# Patient Record
Sex: Female | Born: 1935 | Race: White | Hispanic: No | Smoking: Former smoker
Health system: Southern US, Community
[De-identification: ages and names within clinical notes are randomized; demographics above are authoritative.]

## PROBLEM LIST (undated history)

## (undated) DIAGNOSIS — R600 Localized edema: Secondary | ICD-10-CM

## (undated) DIAGNOSIS — E785 Hyperlipidemia, unspecified: Secondary | ICD-10-CM

## (undated) DIAGNOSIS — K219 Gastro-esophageal reflux disease without esophagitis: Secondary | ICD-10-CM

## (undated) DIAGNOSIS — G4730 Sleep apnea, unspecified: Secondary | ICD-10-CM

## (undated) DIAGNOSIS — J9611 Chronic respiratory failure with hypoxia: Secondary | ICD-10-CM

## (undated) DIAGNOSIS — IMO0002 Reserved for concepts with insufficient information to code with codable children: Secondary | ICD-10-CM

## (undated) DIAGNOSIS — I10 Essential (primary) hypertension: Secondary | ICD-10-CM

## (undated) DIAGNOSIS — I480 Paroxysmal atrial fibrillation: Secondary | ICD-10-CM

## (undated) DIAGNOSIS — F419 Anxiety disorder, unspecified: Secondary | ICD-10-CM

## (undated) DIAGNOSIS — J449 Chronic obstructive pulmonary disease, unspecified: Secondary | ICD-10-CM

## (undated) DIAGNOSIS — I639 Cerebral infarction, unspecified: Secondary | ICD-10-CM

## (undated) DIAGNOSIS — I2510 Atherosclerotic heart disease of native coronary artery without angina pectoris: Secondary | ICD-10-CM

## (undated) DIAGNOSIS — R001 Bradycardia, unspecified: Secondary | ICD-10-CM

## (undated) DIAGNOSIS — I4510 Unspecified right bundle-branch block: Secondary | ICD-10-CM

## (undated) DIAGNOSIS — I779 Disorder of arteries and arterioles, unspecified: Secondary | ICD-10-CM

## (undated) DIAGNOSIS — E119 Type 2 diabetes mellitus without complications: Secondary | ICD-10-CM

## (undated) DIAGNOSIS — M545 Low back pain: Secondary | ICD-10-CM

## (undated) DIAGNOSIS — Z9981 Dependence on supplemental oxygen: Secondary | ICD-10-CM

## (undated) DIAGNOSIS — I739 Peripheral vascular disease, unspecified: Secondary | ICD-10-CM

## (undated) DIAGNOSIS — I5189 Other ill-defined heart diseases: Secondary | ICD-10-CM

## (undated) DIAGNOSIS — I219 Acute myocardial infarction, unspecified: Secondary | ICD-10-CM

## (undated) DIAGNOSIS — G8929 Other chronic pain: Secondary | ICD-10-CM

## (undated) HISTORY — DX: Paroxysmal atrial fibrillation: I48.0

## (undated) HISTORY — DX: Anxiety disorder, unspecified: F41.9

## (undated) HISTORY — PX: CORONARY ANGIOPLASTY: SHX604

## (undated) HISTORY — DX: Unspecified right bundle-branch block: I45.10

## (undated) HISTORY — DX: Other ill-defined heart diseases: I51.89

## (undated) HISTORY — DX: Chronic respiratory failure with hypoxia: J96.11

## (undated) HISTORY — PX: CORONARY ANGIOPLASTY WITH STENT PLACEMENT: SHX49

## (undated) HISTORY — DX: Essential (primary) hypertension: I10

## (undated) HISTORY — DX: Peripheral vascular disease, unspecified: I73.9

## (undated) HISTORY — PX: TONSILLECTOMY: SUR1361

## (undated) HISTORY — DX: Chronic obstructive pulmonary disease, unspecified: J44.9

## (undated) HISTORY — PX: TRANSVAGINAL TAPE (TVT) REMOVAL: SHX6154

## (undated) HISTORY — DX: Reserved for concepts with insufficient information to code with codable children: IMO0002

## (undated) HISTORY — PX: CATARACT EXTRACTION W/ INTRAOCULAR LENS  IMPLANT, BILATERAL: SHX1307

## (undated) HISTORY — DX: Gastro-esophageal reflux disease without esophagitis: K21.9

## (undated) HISTORY — DX: Localized edema: R60.0

## (undated) HISTORY — DX: Hyperlipidemia, unspecified: E78.5

## (undated) HISTORY — PX: TOTAL ABDOMINAL HYSTERECTOMY: SHX209

## (undated) HISTORY — DX: Disorder of arteries and arterioles, unspecified: I77.9

## (undated) HISTORY — DX: Atherosclerotic heart disease of native coronary artery without angina pectoris: I25.10

## (undated) HISTORY — PX: CARDIAC CATHETERIZATION: SHX172

---

## 2000-03-15 ENCOUNTER — Other Ambulatory Visit: Admission: RE | Admit: 2000-03-15 | Discharge: 2000-03-15 | Payer: Self-pay | Admitting: Family Medicine

## 2000-06-30 ENCOUNTER — Inpatient Hospital Stay (HOSPITAL_COMMUNITY): Admission: RE | Admit: 2000-06-30 | Discharge: 2000-07-03 | Payer: Self-pay | Admitting: Cardiovascular Disease

## 2000-06-30 ENCOUNTER — Encounter: Payer: Self-pay | Admitting: Cardiovascular Disease

## 2000-06-30 ENCOUNTER — Encounter: Payer: Self-pay | Admitting: Cardiology

## 2000-07-20 ENCOUNTER — Encounter: Payer: Self-pay | Admitting: Internal Medicine

## 2000-07-20 ENCOUNTER — Encounter: Admission: RE | Admit: 2000-07-20 | Discharge: 2000-07-20 | Payer: Self-pay | Admitting: Internal Medicine

## 2000-07-25 ENCOUNTER — Encounter: Admission: RE | Admit: 2000-07-25 | Discharge: 2000-07-25 | Payer: Self-pay | Admitting: Internal Medicine

## 2000-07-25 ENCOUNTER — Encounter: Payer: Self-pay | Admitting: Internal Medicine

## 2001-07-17 ENCOUNTER — Ambulatory Visit (HOSPITAL_COMMUNITY): Admission: RE | Admit: 2001-07-17 | Discharge: 2001-07-17 | Payer: Self-pay | Admitting: Cardiovascular Disease

## 2001-07-17 ENCOUNTER — Encounter: Payer: Self-pay | Admitting: Cardiovascular Disease

## 2002-07-03 ENCOUNTER — Encounter: Admission: RE | Admit: 2002-07-03 | Discharge: 2002-07-03 | Payer: Self-pay | Admitting: Dermatology

## 2002-07-04 ENCOUNTER — Emergency Department (HOSPITAL_COMMUNITY): Admission: EM | Admit: 2002-07-04 | Discharge: 2002-07-04 | Payer: Self-pay | Admitting: Emergency Medicine

## 2004-10-15 ENCOUNTER — Inpatient Hospital Stay (HOSPITAL_COMMUNITY): Admission: EM | Admit: 2004-10-15 | Discharge: 2004-10-20 | Payer: Self-pay | Admitting: Emergency Medicine

## 2004-10-18 ENCOUNTER — Encounter: Payer: Self-pay | Admitting: Cardiology

## 2004-10-19 ENCOUNTER — Encounter (INDEPENDENT_AMBULATORY_CARE_PROVIDER_SITE_OTHER): Payer: Self-pay | Admitting: Cardiology

## 2004-11-29 ENCOUNTER — Encounter: Admission: RE | Admit: 2004-11-29 | Discharge: 2004-11-29 | Payer: Self-pay | Admitting: Internal Medicine

## 2005-07-22 ENCOUNTER — Encounter: Admission: RE | Admit: 2005-07-22 | Discharge: 2005-07-22 | Payer: Self-pay | Admitting: Orthopedic Surgery

## 2005-07-28 HISTORY — PX: SHOULDER OPEN ROTATOR CUFF REPAIR: SHX2407

## 2005-08-05 ENCOUNTER — Inpatient Hospital Stay (HOSPITAL_COMMUNITY): Admission: RE | Admit: 2005-08-05 | Discharge: 2005-08-06 | Payer: Self-pay | Admitting: Orthopedic Surgery

## 2005-09-08 ENCOUNTER — Encounter: Admission: RE | Admit: 2005-09-08 | Discharge: 2005-12-07 | Payer: Self-pay | Admitting: Orthopedic Surgery

## 2006-08-02 ENCOUNTER — Emergency Department (HOSPITAL_COMMUNITY): Admission: EM | Admit: 2006-08-02 | Discharge: 2006-08-02 | Payer: Self-pay | Admitting: Family Medicine

## 2007-06-07 ENCOUNTER — Encounter: Admission: RE | Admit: 2007-06-07 | Discharge: 2007-06-07 | Payer: Self-pay | Admitting: Cardiology

## 2007-07-30 ENCOUNTER — Encounter: Admission: RE | Admit: 2007-07-30 | Discharge: 2007-07-30 | Payer: Self-pay | Admitting: Family Medicine

## 2007-08-13 ENCOUNTER — Encounter: Admission: RE | Admit: 2007-08-13 | Discharge: 2007-08-13 | Payer: Self-pay | Admitting: Orthopedic Surgery

## 2007-09-29 ENCOUNTER — Encounter: Admission: RE | Admit: 2007-09-29 | Discharge: 2007-09-29 | Payer: Self-pay | Admitting: Orthopedic Surgery

## 2008-07-11 ENCOUNTER — Observation Stay (HOSPITAL_COMMUNITY): Admission: EM | Admit: 2008-07-11 | Discharge: 2008-07-13 | Payer: Self-pay | Admitting: Emergency Medicine

## 2008-07-17 ENCOUNTER — Encounter: Admission: RE | Admit: 2008-07-17 | Discharge: 2008-10-07 | Payer: Self-pay | Admitting: Orthopedic Surgery

## 2008-08-18 ENCOUNTER — Ambulatory Visit: Payer: Self-pay | Admitting: Pulmonary Disease

## 2008-08-18 ENCOUNTER — Inpatient Hospital Stay (HOSPITAL_COMMUNITY): Admission: EM | Admit: 2008-08-18 | Discharge: 2008-08-25 | Payer: Self-pay | Admitting: Emergency Medicine

## 2008-08-18 ENCOUNTER — Encounter (INDEPENDENT_AMBULATORY_CARE_PROVIDER_SITE_OTHER): Payer: Self-pay | Admitting: Cardiology

## 2009-06-25 ENCOUNTER — Inpatient Hospital Stay (HOSPITAL_COMMUNITY): Admission: EM | Admit: 2009-06-25 | Discharge: 2009-06-29 | Payer: Self-pay | Admitting: Emergency Medicine

## 2009-06-26 ENCOUNTER — Encounter (INDEPENDENT_AMBULATORY_CARE_PROVIDER_SITE_OTHER): Payer: Self-pay | Admitting: Cardiovascular Disease

## 2009-07-02 ENCOUNTER — Encounter: Payer: Self-pay | Admitting: Cardiology

## 2010-01-11 ENCOUNTER — Telehealth: Payer: Self-pay | Admitting: Family Medicine

## 2010-01-11 ENCOUNTER — Ambulatory Visit: Payer: Self-pay | Admitting: Family Medicine

## 2010-01-11 DIAGNOSIS — IMO0001 Reserved for inherently not codable concepts without codable children: Secondary | ICD-10-CM

## 2010-01-11 DIAGNOSIS — E119 Type 2 diabetes mellitus without complications: Secondary | ICD-10-CM | POA: Insufficient documentation

## 2010-01-11 LAB — CONVERTED CEMR LAB
ALT: 18 units/L (ref 0–35)
BUN: 45 mg/dL — ABNORMAL HIGH (ref 6–23)
Bilirubin, Direct: 0.2 mg/dL (ref 0.0–0.3)
Chloride: 105 meq/L (ref 96–112)
GFR calc non Af Amer: 39.11 mL/min (ref >60)
Glucose, Bld: 194 mg/dL — ABNORMAL HIGH (ref 70–99)
Hgb A1c MFr Bld: 7.8 % — ABNORMAL HIGH (ref 4.6–6.5)
Potassium: 4 meq/L (ref 3.5–5.1)
Sodium: 142 meq/L (ref 135–145)
Total Bilirubin: 0.5 mg/dL (ref 0.3–1.2)
Total Protein: 7.1 g/dL (ref 6.0–8.3)

## 2010-01-12 LAB — CONVERTED CEMR LAB: Total CK: 57 units/L (ref 7–177)

## 2010-01-21 ENCOUNTER — Ambulatory Visit: Payer: Self-pay | Admitting: Diagnostic Radiology

## 2010-01-21 ENCOUNTER — Emergency Department (HOSPITAL_BASED_OUTPATIENT_CLINIC_OR_DEPARTMENT_OTHER): Admission: EM | Admit: 2010-01-21 | Discharge: 2010-01-21 | Payer: Self-pay | Admitting: Emergency Medicine

## 2010-01-23 ENCOUNTER — Encounter: Payer: Self-pay | Admitting: Emergency Medicine

## 2010-01-23 ENCOUNTER — Ambulatory Visit: Payer: Self-pay | Admitting: Diagnostic Radiology

## 2010-01-23 ENCOUNTER — Inpatient Hospital Stay (HOSPITAL_COMMUNITY): Admission: EM | Admit: 2010-01-23 | Discharge: 2010-01-26 | Payer: Self-pay | Admitting: Cardiovascular Disease

## 2010-01-24 ENCOUNTER — Encounter (INDEPENDENT_AMBULATORY_CARE_PROVIDER_SITE_OTHER): Payer: Self-pay | Admitting: Cardiovascular Disease

## 2010-02-11 ENCOUNTER — Ambulatory Visit: Payer: Self-pay | Admitting: Family Medicine

## 2010-02-11 DIAGNOSIS — E785 Hyperlipidemia, unspecified: Secondary | ICD-10-CM

## 2010-02-11 DIAGNOSIS — I10 Essential (primary) hypertension: Secondary | ICD-10-CM

## 2010-02-15 ENCOUNTER — Encounter (INDEPENDENT_AMBULATORY_CARE_PROVIDER_SITE_OTHER): Payer: Self-pay | Admitting: *Deleted

## 2010-02-15 ENCOUNTER — Encounter: Payer: Self-pay | Admitting: Family Medicine

## 2010-03-09 ENCOUNTER — Telehealth: Payer: Self-pay | Admitting: Family Medicine

## 2010-03-12 ENCOUNTER — Telehealth: Payer: Self-pay | Admitting: Family Medicine

## 2010-03-31 ENCOUNTER — Ambulatory Visit: Payer: Self-pay | Admitting: Family Medicine

## 2010-04-25 ENCOUNTER — Inpatient Hospital Stay (HOSPITAL_COMMUNITY): Admission: EM | Admit: 2010-04-25 | Discharge: 2010-05-04 | Payer: Self-pay | Admitting: Emergency Medicine

## 2010-04-26 ENCOUNTER — Encounter (INDEPENDENT_AMBULATORY_CARE_PROVIDER_SITE_OTHER): Payer: Self-pay | Admitting: Emergency Medicine

## 2010-04-29 ENCOUNTER — Encounter (INDEPENDENT_AMBULATORY_CARE_PROVIDER_SITE_OTHER): Payer: Self-pay | Admitting: Cardiology

## 2010-04-29 ENCOUNTER — Encounter: Payer: Self-pay | Admitting: Cardiology

## 2010-05-24 ENCOUNTER — Telehealth: Payer: Self-pay | Admitting: Family Medicine

## 2010-06-04 ENCOUNTER — Emergency Department (HOSPITAL_COMMUNITY)
Admission: EM | Admit: 2010-06-04 | Discharge: 2010-06-05 | Payer: Self-pay | Source: Home / Self Care | Admitting: Emergency Medicine

## 2010-06-05 ENCOUNTER — Encounter: Payer: Self-pay | Admitting: Family Medicine

## 2010-06-07 ENCOUNTER — Ambulatory Visit: Payer: Self-pay | Admitting: Pulmonary Disease

## 2010-06-07 ENCOUNTER — Telehealth: Payer: Self-pay | Admitting: Pulmonary Disease

## 2010-06-07 DIAGNOSIS — I519 Heart disease, unspecified: Secondary | ICD-10-CM

## 2010-06-07 DIAGNOSIS — J9611 Chronic respiratory failure with hypoxia: Secondary | ICD-10-CM | POA: Insufficient documentation

## 2010-06-07 DIAGNOSIS — I2789 Other specified pulmonary heart diseases: Secondary | ICD-10-CM

## 2010-06-07 DIAGNOSIS — G4710 Hypersomnia, unspecified: Secondary | ICD-10-CM

## 2010-06-08 ENCOUNTER — Encounter: Payer: Self-pay | Admitting: Pulmonary Disease

## 2010-06-08 ENCOUNTER — Ambulatory Visit: Payer: Self-pay | Admitting: Pulmonary Disease

## 2010-06-09 ENCOUNTER — Telehealth (INDEPENDENT_AMBULATORY_CARE_PROVIDER_SITE_OTHER): Payer: Self-pay | Admitting: *Deleted

## 2010-06-09 ENCOUNTER — Encounter: Payer: Self-pay | Admitting: Pulmonary Disease

## 2010-06-11 ENCOUNTER — Encounter: Payer: Self-pay | Admitting: Pulmonary Disease

## 2010-06-16 ENCOUNTER — Ambulatory Visit: Payer: Self-pay | Admitting: Pulmonary Disease

## 2010-06-16 DIAGNOSIS — J439 Emphysema, unspecified: Secondary | ICD-10-CM

## 2010-06-24 ENCOUNTER — Encounter: Payer: Self-pay | Admitting: Pulmonary Disease

## 2010-06-30 ENCOUNTER — Telehealth: Payer: Self-pay | Admitting: Family Medicine

## 2010-07-06 ENCOUNTER — Ambulatory Visit
Admission: RE | Admit: 2010-07-06 | Discharge: 2010-07-06 | Payer: Self-pay | Source: Home / Self Care | Attending: Family Medicine | Admitting: Family Medicine

## 2010-07-06 DIAGNOSIS — I480 Paroxysmal atrial fibrillation: Secondary | ICD-10-CM | POA: Insufficient documentation

## 2010-07-07 ENCOUNTER — Telehealth: Payer: Self-pay | Admitting: Family Medicine

## 2010-07-08 ENCOUNTER — Telehealth: Payer: Self-pay | Admitting: Family Medicine

## 2010-07-17 ENCOUNTER — Encounter: Payer: Self-pay | Admitting: Family Medicine

## 2010-07-18 ENCOUNTER — Encounter: Payer: Self-pay | Admitting: Family Medicine

## 2010-07-19 ENCOUNTER — Encounter (INDEPENDENT_AMBULATORY_CARE_PROVIDER_SITE_OTHER): Payer: Self-pay | Admitting: *Deleted

## 2010-07-19 LAB — CONVERTED CEMR LAB
INR: 1.33 (ref <1.50)
Prothrombin Time: 16.7 s — ABNORMAL HIGH (ref 11.6–15.2)

## 2010-07-21 ENCOUNTER — Encounter: Payer: Self-pay | Admitting: Family Medicine

## 2010-07-29 NOTE — Miscellaneous (Signed)
  Clinical Lists Changes  Medications: Changed medication from WARFARIN SODIUM 5 MG TABS (WARFARIN SODIUM) Take 1-1/2 tablets by mouth daily. to WARFARIN SODIUM 5 MG TABS (WARFARIN SODIUM) Take 1-1/2 tablets by mouth daily except 2 tablets by mouth on Wednesdays and Sundays.

## 2010-07-29 NOTE — Progress Notes (Signed)
Summary: pt requests samples  Phone Note Call from Patient   Caller: Patient Summary of Call: Pt request samples of insulin.  One box of levemir given- lot [id number], exp 07/2011. Initial call taken by: Lowella Petties CMA,  March 12, 2010 3:28 PM  Follow-up for Phone Call        thanks.   Follow-up by: Crawford Givens MD,  March 12, 2010 5:00 PM

## 2010-07-29 NOTE — Miscellaneous (Signed)
Summary: Orders Update pft charges  Clinical Lists Changes  Orders: Added new Service order of Carbon Monoxide diffusing w/capacity (94720) - Signed Added new Service order of Lung Volumes (94240) - Signed Added new Service order of Spirometry (Pre & Post) (94060) - Signed 

## 2010-07-29 NOTE — Progress Notes (Signed)
Summary: prior auth for humulin is on your desk  Phone Note From Pharmacy   Caller: [facility] [phone] Summary of Call: Prior [name] is needed on humulin R.  Form is on your desk. Initial call taken by: Lowella Petties CMA, AAMA,  June 30, 2010 4:02 PM  Follow-up for Phone Call        see hard copy.  thanks. Crawford Givens MD  June 30, 2010 10:27 PM   Dr. Para March asked that I contact the patient to find out how much Humulin she is usually taking (average, since it is a sliding scale) so that he can finish the form.  Left message on voicemail  to return call. [name] CMA Duncan Dull)  July 01, 2010 10:38 AM   Additional Follow-up for Phone Call Additional follow up Details #1::        Patient states that if her BS is 151, she takes 3 or 4 units and ups it according to her reading.  This is done 3 times a day, at breakfast, lunch and dinner.  She is coming in for OV on Tuesday and states she has been in the hospital twice in the last 60 days.  Dr. Allyson Sabal and Dr. Craige Cotta should have records to forward to you on her care in the hospital.  [name] CMA Duncan Dull)  July 01, 2010 2:49 PM     Additional Follow-up for Phone Call Additional follow up Details #2::    Please get the records and we'll talk about her sugar when she comes in.  please get me with her.  Have her keep a log of how many units she is taking per day between now and then.  Bring that and her sliding scale directions in with her.   please hold the PA and I'll do it after the OV.  thanks.  Crawford Givens MD  July 01, 2010 2:58 PM   Patient Advised.   Patient says she is almost completely out of her Humulin R (sliding scale) and will not be able to keep the log between now and Tuesday without a refill on her medication.  She is going to call her insurance company to see if they will approve 1 refill until the PA can be submitted.  [name] CMA Duncan Dull)  July 01, 2010 4:07 PM     Additional Follow-up for  Phone Call Additional follow up Details #3:: Details for Additional Follow-up Action Taken: Patient phoned her insurance company and they told her that it is just a formality because of the new year.  They told her that they have it taken care of and she can get her refill whenever she needs it.  I think all is well at least until she comes in on Tuesday.  [name] CMA (AAMA)  July 01, 2010 4:46 PM   Noted.  Crawford Givens MD  July 01, 2010 4:58 PM    Appended Document: prior auth for humulin is on your desk Insurance company called to let us know that prior auth for insulin was approved.  They tried to fax a letter but the fax machine has been down all day.  Appended Document: prior auth for humulin is on your desk noted.

## 2010-07-29 NOTE — Progress Notes (Signed)
Summary: appt, new consult  Phone Note Call from Patient Call back at [phone]   Caller: son in-law//pearson grane Call For: clance Summary of Call: States that pt was d/c from [facility] two weeks ago and needs to be seen as a new pt today, re:sob, pls advise. Initial call taken by: Darletta Moll,  June 07, 2010 8:43 AM  Follow-up for Phone Call        spoke with pt Son-in-law and he staets that Dr. Selena Batten told him it was imperative that the pt be seen today.  We also recieved an email from Dr. Darrick Penna stating: "Received call from Dr. Dan Humphreys at [facility] ED regarding patient of Dr. Thurnell Garbe DOB 12/16/1927, seen in ED 06-04-10 with SOB/weakness. Requesting follow-up in pulm clinic." Pt set to see VS today at 11:45. Carron Curie CMA  June 07, 2010 10:20 AM

## 2010-07-29 NOTE — Progress Notes (Signed)
Summary: Alprazolam  Phone Note Refill Request Message from:  Fax from Pharmacy on May 24, 2010 10:22 AM  Refills Requested: Medication #1:  XANAX 0.25 MG TABS Take 1/2 to 1 tablet by mouth two times a day  as needed for anxiety. [facility]  Phone:   [phone]   Method Requested: Telephone to Pharmacy Initial call taken by: [name] CMA  Dull),  May 24, 2010 10:22 AM

## 2010-07-29 NOTE — Miscellaneous (Signed)
Summary: Pulmonary function test   Pulmonary Function Test Date: 06/08/2010 Height (in.): 61 Gender: Female  Pre-Spirometry FVC    Value: 1.46 L/min   Pred: 2.24 L/min     % Pred: 65 % FEV1    Value: 0.79 L     Pred: 1.50 L     % Pred: 53 % FEV1/FVC  Value: 54 %     Pred: 70 %     % Pred: . % FEF 25-75  Value: 0.32 L/min   Pred: 1.81 L/min     % Pred: 18 %  Post-Spirometry FVC    Value: 1.56 L/min   Pred: 2.24 L/min     % Pred: 69 % FEV1    Value: 0.93 L     Pred: 1.50 L     % Pred: 62 % FEV1/FVC  Value: 60 %     Pred: 70 %     % Pred: . % FEF 25-75  Value: 0.42 L/min   Pred: 1.81 L/min     % Pred: 23 %  Lung Volumes TLC    Value: 2.84 L   % Pred: 69 % RV    Value: 1.38 L   % Pred: 78 % DLCO    Value: 9.9 %   % Pred: 54 % DLCO/VA  Value: 3.37 %   % Pred: 101 %  Comments: Severe obstruction.  Positive bronchodilator response.  Mild restriction.  Moderate diffusion defect. Clinical Lists Changes  Observations: Added new observation of PFT COMMENTS: Severe obstruction.  Positive bronchodilator response.  Mild restriction.  Moderate diffusion defect. (06/08/2010 8:57) Added new observation of DLCO/VA%EXP: 101 % (06/08/2010 8:57) Added new observation of DLCO/VA: 3.37 % (06/08/2010 8:57) Added new observation of DLCO % EXPEC: 54 % (06/08/2010 8:57) Added new observation of DLCO: 9.9 % (06/08/2010 8:57) Added new observation of RV % EXPECT: 78 % (06/08/2010 8:57) Added new observation of RV: 1.38 L (06/08/2010 8:57) Added new observation of TLC % EXPECT: 69 % (06/08/2010 8:57) Added new observation of TLC: 2.84 L (06/08/2010 8:57) Added new observation of FEF2575%EXPS: 23 % (06/08/2010 8:57) Added new observation of PSTFEF25/75P: 1.81  (06/08/2010 8:57) Added new observation of PSTFEF25/75%: 0.42 L/min (06/08/2010 8:57) Added new observation of PSTFEV1/FCV%: . % (06/08/2010 8:57) Added new observation of FEV1FVCPRDPS: 70 % (06/08/2010 8:57) Added new observation of PSTFEV1/FVC:  60 % (06/08/2010 8:57) Added new observation of POSTFEV1%PRD: 62 % (06/08/2010 8:57) Added new observation of FEV1PRDPST: 1.50 L (06/08/2010 8:57) Added new observation of POST FEV1: 0.93 L/min (06/08/2010 8:57) Added new observation of POST FVC%EXP: 69 % (06/08/2010 8:57) Added new observation of FVCPRDPST: 2.24 L/min (06/08/2010 8:57) Added new observation of POST FVC: 1.56 L (06/08/2010 8:57) Added new observation of FEF % EXPEC: 18 % (06/08/2010 8:57) Added new observation of FEF25-75%PRE: 1.81 L/min (06/08/2010 8:57) Added new observation of FEF 25-75%: 0.32 L/min (06/08/2010 8:57) Added new observation of FEV1/FVC%EXP: . % (06/08/2010 8:57) Added new observation of FEV1/FVC PRE: 70 % (06/08/2010 8:57) Added new observation of FEV1/FVC: 54 % (06/08/2010 8:57) Added new observation of FEV1 % EXP: 53 % (06/08/2010 8:57) Added new observation of FEV1 PREDICT: 1.50 L (06/08/2010 8:57) Added new observation of FEV1: 0.79 L (06/08/2010 8:57) Added new observation of FVC % EXPECT: 65 % (06/08/2010 8:57) Added new observation of FVC PREDICT: 2.24 L (06/08/2010 8:57) Added new observation of FVC: 1.46 L (06/08/2010 8:57) Added new observation of PFT HEIGHT: 61  (06/08/2010 8:57) Added new observation of PFT DATE:  06/08/2010  (06/08/2010 8:57) 

## 2010-07-29 NOTE — Miscellaneous (Signed)
Summary: Advanced Directive  Advanced Directive   Imported By: Maryln Gottron 02/19/2010 15:28:48  _____________________________________________________________________  External Attachment:    Type:   Image     Comment:   External Document

## 2010-07-29 NOTE — Miscellaneous (Signed)
Summary: Overnight oximetry with 2.5 liters oxygen  Clinical Lists Changes 7hrs 57 min test time.  Mean SpO2 97.5%, low 86%.  Spent 40 sec with SpO2 < 88%.  Continue with 2.5 liters oxygen.

## 2010-07-29 NOTE — Letter (Signed)
Summary: Consult/[facility]  Consult/[facility]   Imported By: Sherian Rein 06/08/2010 08:32:01  _____________________________________________________________________  External Attachment:    Type:   Image     Comment:   External Document

## 2010-07-29 NOTE — Progress Notes (Signed)
Summary: needs OV to discuss PFt results  ---- Converted from flag ---- ---- 06/09/2010 9:04 AM, [name] MD wrote: can you schedule ms [place] for rov to discuss pft results.  thanks ------------------------------  Phone Note Outgoing Call   Call placed by: Michel Bickers CMA,  June 09, 2010 1:59 PM Call placed to: Patient Summary of Call: [facility].  Pt needs OV w/ VS to discuss PFT's. Initial call taken by: Michel Bickers CMA,  June 09, 2010 1:59 PM  Follow-up for Phone Call        Pt is sch for f/u on 06/16/10 @ 3pm w/ VS to discuss PFT's. Follow-up by: Michel Bickers CMA,  June 11, 2010 2:14 PM

## 2010-07-29 NOTE — Procedures (Signed)
Summary: Oximetry/[facility]  Oximetry/[facility]   Imported By: Sherian Rein 07/01/2010 14:41:18  _____________________________________________________________________  External Attachment:    Type:   Image     Comment:   External Document
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
## 2010-07-29 NOTE — Assessment & Plan Note (Signed)
Summary: NEW PT-XFER FROM [facility]   Vital Signs:  Patient profile:   77 year old female Height:      60 inches Weight:      160.25 pounds BMI:     31.41 Temp:     97.7 degrees F oral Pulse rate:   80 / minute Pulse rhythm:   regular BP sitting:   122 / 80  (left arm) Cuff size:   regular  Vitals Entered By: [name] CMA Duncan Dull) (January 11, 2010 10:43 AM) CC: Transfer from [place].  Patient states she has signed for her records to be here months ago, both here and at the [place] office.  I have nothing and I can tell she is getting a bit agitated with me as I'm questioning her about it.  So I have no meds or allergies to list.   History of Present Illness: [facility].  [phone] [employer] patient.  Cards: Dr. Allyson Sabal- last visit was a few months ago.  "I was doing pretty good until the last few weeks."   Daughter recent hired on new job, and patient hopes that will be a good change for her.    Dyspnea.  Longstanding for patient.  Worse recently, coinciding with increase in humidity.  No CP.  Leg cramps noted by patient at night.  Prev was just in calf bilaterally, but now above the knee.  Not every night, but "when I have them, they are bad."  Off statin now.  Had been taken off lipitor and crestor prev due to cramps.    Occ feeling of "things sticking" while swallowing.  No vomiting.  Can happen with solids but has no h/o aspiration of liquids.  No acute change recently.   Diabetes: on levemir insulin with humilin R insulin Using medications without difficulties:yes Hypoglycemic episodes:occ, but improved from prev Hyperglycemic episodes:occ, but improved from prev Feet problems: no  Blood Sugars averaging: usually  ~120 in AM, "I usually get shaky if it is <100 in the morning." Eye exam within last year: not yet; I have d/w patient.     Allergies (verified): 1)  ! * Opiates 2)  ! Morphine 3)  ! Crestor 4)  ! Lipitor  Past History:  Past Medical  History: DM2 HTN HLD h/o edema  Social History: Reviewed history and no changes required. Widowed, lives alone.  Daughter is local.  No smoking.   Review of Systems       See HPI.  Otherwise noncontributory.    Physical Exam  General:  GEN: nad, alert and oriented x3, both short term and long term recall excellent, not on O2 HEENT: mucous membranes moist NECK: supple w/o LA, no mass appreciated. CV: rrr.  PULM: ctab, no inc wob, no decrease in BS at bases ABD: soft, +bs EXT: no edema SKIN: no acute rash  decreased sensation on L foot compared to R foot.   Diabetes Management Exam:    Foot Exam (with socks and/or shoes not present):       Sensory-Pinprick/Light touch:          Left medial foot (L-4): normal          Left dorsal foot (L-5): normal          Left lateral foot (S-1): normal          Right medial foot (L-4): normal          Right dorsal foot (L-5): normal  Right lateral foot (S-1): normal       Sensory-Monofilament:          Left foot: diminished          Right foot: normal       Inspection:          Left foot: normal          Right foot: normal       Nails:          Left foot: normal          Right foot: normal   Impression & Recommendations:  Problem # 1:  DM (ICD-250.00)  2 samples of levemir given.  I have d/w patient re:her labs and this is improved from previous.  Requesting records and no changes in meds in meantime.  I have encouraged eye MD f/u.   Orders: TLB-A1C / Hgb A1C (Glycohemoglobin) (83036-A1C)  Her updated medication list for this problem includes:    Losartan Potassium 100 Mg Tabs (Losartan potassium) .Marland Kitchen... Take 1 tablet by mouth every morning    Aspirin 81 Mg Tabs (Aspirin) .Marland Kitchen... Take 1 tablet by mouth every morning    Humulin R 100 Unit/ml Soln (Insulin regular human) .Marland Kitchen... Per sliding scale    Levemir Flexpen 100 Unit/ml Soln (Insulin detemir) .Marland Kitchen... Take as directed; using 25 units two times a day as of  12/2009  Problem # 2:  MYALGIA (ICD-729.1) This is not due to statin as she is off this.  CK is pending as of my conversation with her.  Will contact with this.  No change in meds in the meantime.  Orders: T-CK Total [id number]) Specimen Handling (14782) TLB-Hepatic/Liver Function Pnl (80076-HEPATIC) TLB-BMP (Basic Metabolic Panel-BMET) (80048-METABOL)  Her updated medication list for this problem includes:    Aspirin 81 Mg Tabs (Aspirin) .Marland Kitchen... Take 1 tablet by mouth every morning  Problem # 3:  DYSPNEA (ICD-786.05)  This may have been related to air quality and heat/humidity.  BNP not elevated and lungs clear.  No change in meds now.  Continue lasix .  Also, will observe pharyngeal symptoms.  Oral and neck exam w/o acute abnormality and she'll report if this continues.  Defer work up for now.  She agrees.  Orders: TLB-BNP (B-Natriuretic Peptide) (83880-BNPR)  Complete Medication List: 1)  Klor-con 20 Meq Pack (Potassium chloride) .... Take 1 tablet by mouth every morning 2)  Furosemide 40 Mg Tabs (Furosemide) .... Take 1 tablet by mouth every morning and 1 tablet at noon and evening as needed. 3)  Amlodipine Besylate 10 Mg Tabs (Amlodipine besylate) .... Take 1 tablet by mouth once a day 4)  Imdur 60 Mg Xr24h-tab (Isosorbide mononitrate) .... Take 1 tablet by mouth every morning 5)  Metoprolol Succinate 25 Mg Xr24h-tab (Metoprolol succinate) .... Take 1/2 tablet by mouth every morning 6)  Losartan Potassium 100 Mg Tabs (Losartan potassium) .... Take 1 tablet by mouth every morning 7)  Aspirin 81 Mg Tabs (Aspirin) .... Take 1 tablet by mouth every morning 8)  Humulin R 100 Unit/ml Soln (Insulin regular human) .... Per sliding scale 9)  Levemir Flexpen 100 Unit/ml Soln (Insulin detemir) .... Take as directed; using 25 units two times a day as of 12/2009  Patient Instructions: 1)  Please schedule a follow-up appointment in 3 months . We'll contact you with your lab report.   Prescriptions: LEVEMIR FLEXPEN 100 UNIT/ML SOLN (INSULIN DETEMIR) take as directed; using 25 units two times a day as of 12/2009  #  3 boxes x 3   Entered and Authorized by:   Crawford Givens MD   Signed by:   Crawford Givens MD on 01/11/2010   Method used:   Historical   RxID:   [id number]   Current Allergies (reviewed today): ! * OPIATES ! MORPHINE ! CRESTOR ! LIPITOR

## 2010-07-29 NOTE — Progress Notes (Signed)
Summary: Test strips  Phone Note From Pharmacy Call back at Home Phone [phone]   Caller:  [facility]  [phone] Call For: Dr. Para March  Summary of Call: Patient has contacted the pharmacy saying that she needs her test strips.  She has run out because she was told to test her blood sugar 8 times per day.  I only see that she was asked to report her blood sugars but nothing about testing 8 times per day. Initial call taken by: [name] CMA Duncan Dull),  March 09, 2010 3:27 PM  Follow-up for Phone Call        please send in new rx.  she shouldn't need to check that often.  I would expect 4x/day max.  Follow-up by: Crawford Givens MD,  March 09, 2010 3:31 PM  Additional Follow-up for Phone Call Additional follow up Details #1::        Done. Additional Follow-up by: [name] CMA Duncan Dull),  March 09, 2010 3:50 PM    Prescriptions: ONETOUCH ULTRA TEST  STRP (GLUCOSE BLOOD) check blood sugar twice a day  #100 x prn   Entered by:   [name] CMA (AAMA)   Authorized by:   Crawford Givens MD   Signed by:   [name] CMA (AAMA) on 03/09/2010   Method used:   Faxed to ...       Hospital doctor (retail)       [address]       [place], [place]  [address]       Ph: [phone] or [phone]       Fax: [phone]   RxID:   [id number] [place] TEST  STRP (GLUCOSE BLOOD) check blood sugar twice a day  #100 x prn   Entered and Authorized by:   Crawford Givens MD   Signed by:   Crawford Givens MD on 03/09/2010   Method used:   Telephoned to ...         RxID:   [id number]   Appended Document: Test strips Tried to phone patient to let her know to test 2 times per day, maximum 4 x/day.  No answer and no A/M.  Appended Document: Test strips No answer and no A/M.  Appended Document: Test strips Patient Advised.

## 2010-07-29 NOTE — Assessment & Plan Note (Signed)
Summary: 2:30 APPT NOT FEELING WEEK/RBH   Vital Signs:  Patient profile:   77 year old female Height:      60 inches Weight:      160.25 pounds BMI:     31.41 Temp:     97.7 degrees F oral Pulse rate:   80 / minute Pulse rhythm:   regular BP sitting:   132 / 70  (left arm) Cuff size:   regular  Vitals Entered By: Linde Gillis CMA Duncan Dull) (March 31, 2010 2:31 PM) CC: not feeling well   History of Present Illness: L ear with change noted- now with constant noise- not ringing but a "friction/static noise, like 2 wires rubbing together."  Doesn't like to wear hearing aid with it.  Going on for a few months.  Occ change in balance with position change, but no sig change from baseline.  No FCNAV.  No ear pain.  [facility] [place]    DM- Sugar has been variable.  Up to 300 occ.  Occ lows at night, woke up with symptoms.  Used a mint and took a snack with some relief for lows.  Using sliding scale.  Goal A1c is  ~8 to avoid sig/frequent hypoglycemia.    samples of crestor and zetia given to patient.   Allergies: 1)  ! * Opiates 2)  ! Morphine 3)  ! Crestor 4)  ! Lipitor  Past History:  Past Medical History: Last updated: 02/15/2010 DM2 HTN, pulmonary HTN HLD h/o edema Cath 8/11 at [facility] (Dr. Allyson Sabal), rec medical tx only w/o new stent ERF 50-55% as of 8/11 Full code, see note from 02/15/10 scanned  Social History: Reviewed history from 01/11/2010 and no changes required. Widowed, lives alone.  Daughter is local.  No smoking.   Review of Systems       See HPI.  Otherwise negative.    Physical Exam  General:  GEN: nad, alert and oriented HEENT: mucous membranes moist, TM w/o erythema bilaterally, bilateral SOM, more easily clears the R ET with valsalva than the L. nasal exam noted for clear rhinorrhea NECK: supple w/o LA CV: rrr. PULM: ctab, no inc wob ABD: soft, +bs Rinne localized to L, air>bone conduction bilaterally   Impression &  Recommendations:  Problem # 1:  DM (ICD-250.00) >25 min spent with patient, at least half of which was spent on counseling re:dm and ETD.  check A1c today and will not change meds unless sig change in A1c.  This was d/w patient and she agrees.  Her updated medication list for this problem includes:    Losartan Potassium 100 Mg Tabs (Losartan potassium) .Marland Kitchen... Take 1 tablet by mouth every morning    Aspirin 81 Mg Tabs (Aspirin) .Marland Kitchen... Take 1 tablet by mouth every morning    Humulin R 100 Unit/ml Soln (Insulin regular human) .Marland Kitchen... Per sliding scale: 150 or less, no r. 151 or higher, 3 units.  >200, 6 units    Levemir Flexpen 100 Unit/ml Soln (Insulin detemir) .Marland Kitchen... Take as directed; using 25 units two times a day as of 12/2009  Orders: TLB-A1C / Hgb A1C (Glycohemoglobin) (83036-A1C)  Problem # 2:  EUSTACHIAN TUBE DYSFUNCTION, LEFT (ICD-381.81) Will use otc claritin for rhinorrhea and see if this helps some.  I would like to avoid steroids of any variety in the patient if possible.  She agrees.  Refer back to hearing center if no relief.  She understood.   Complete Medication List:  1)  Klor-con 20 Meq Pack (Potassium chloride) .... Take 1 tablet by mouth every morning 2)  Furosemide 40 Mg Tabs (Furosemide) .... Take 1 tablet by mouth two times a day 3)  Imdur 60 Mg Xr24h-tab (Isosorbide mononitrate) .... Take 1 tablet by mouth every morning 4)  Losartan Potassium 100 Mg Tabs (Losartan potassium) .... Take 1 tablet by mouth every morning 5)  Aspirin 81 Mg Tabs (Aspirin) .... Take 1 tablet by mouth every morning 6)  Humulin R 100 Unit/ml Soln (Insulin regular human) .... Per sliding scale: 150 or less, no r. 151 or higher, 3 units.  >200, 6 units 7)  Levemir Flexpen 100 Unit/ml Soln (Insulin detemir) .... Take as directed; using 25 units two times a day as of 12/2009 8)  Lopressor 50 Mg Tabs (Metoprolol tartrate) .... 25 mg. two times a day 9)  Plavix 75 Mg Tabs (Clopidogrel bisulfate) .... Take 1  tablet by mouth once a day with a meal. 10)  Protonix 40 Mg Tbec (Pantoprazole sodium) .... Take 1 tablet by mouth every morning 11)  Nifedipine 30 Mg Xr24h-tab (Nifedipine) .... Take 1 tablet by mouth every morning 12)  Crestor 5 Mg Tabs (Rosuvastatin calcium) .Marland Kitchen.. 1 by mouth on mwf 13)  Onetouch Ultra Test Strp (Glucose blood) .... Check blood sugar twice a day 14)  Xanax 0.25 Mg Tabs (Alprazolam) .... Take 1/2 to 1 tablet by mouth two times a day  as needed for anxiety. 15)  Zetia 10 Mg Tabs (Ezetimibe) .Marland Kitchen.. 1 by mouth q day  Other Orders: Influenza Vaccine MCR (60454)  Patient Instructions: 1)  Take claritin 10mg  a day for your runny nose and see if it changes your symptoms in you left ear.  Let me know if you aren't doing better and we can set you up with the [facility] in [place].  I'll send word about your lab.  Take care.  Glad to see you.   Current Allergies (reviewed today): ! * OPIATES ! MORPHINE ! CRESTOR ! LIPITOR   Immunizations Administered:  Influenza Vaccine # 1:    Vaccine Type: Fluvax MCR    Site: left deltoid    Mfr: GlaxoSmithKline    Dose: 0.5 ml    Route: IM    Given by: Linde Gillis CMA (AAMA)    Exp. Date: 12/25/2010    Lot #: [id number]    VIS given: 01/19/10 version given March 31, 2010.  Flu Vaccine Consent Questions:    Do you have a history of severe allergic reactions to this vaccine? no    Any prior history of allergic reactions to egg and/or gelatin? no    Do you have a sensitivity to the preservative Thimersol? no    Do you have a past history of Guillan-Barre Syndrome? no    Do you currently have an acute febrile illness? no    Have you ever had a severe reaction to latex? no    Vaccine information given and explained to patient? yes    Are you currently pregnant? no

## 2010-07-29 NOTE — Progress Notes (Signed)
  Phone Note Outgoing Call   Summary of Call: call placed to patient. See OV note.

## 2010-07-29 NOTE — Progress Notes (Signed)
Summary: refill request for alprazolam  Phone Note Refill Request Message from:  Fax from Pharmacy  Refills Requested: Medication #1:  alprazolam .25   Last Refilled: 01/18/2010 Faxed request from [facility], phone [phone].   This is not on med list.  Initial call taken by: Lowella Petties CMA,  March 09, 2010 8:24 AM  Follow-up for Phone Call        please call this in.  xanax 0.25mg , 0.5 to 1 tab by mouth two times a day as needed anxiety  #30, 1rf.  thanks.  Follow-up by: Crawford Givens MD,  March 09, 2010 2:06 PM  Additional Follow-up for Phone Call Additional follow up Details #1::        Medication phoned to pharmacy.  Additional Follow-up by: [name] CMA (AAMA),  March 09, 2010 3:15 PM    New/Updated Medications: XANAX 0.25 MG TABS (ALPRAZOLAM) Take 1/2 to 1 tablet by mouth two times a day  as needed for anxiety. Prescriptions: XANAX 0.25 MG TABS (ALPRAZOLAM) Take 1/2 to 1 tablet by mouth two times a day  as needed for anxiety.  #30 x 1   Entered by:   [name] CMA (AAMA)   Authorized by:   Crawford Givens MD   Signed by:   [name] CMA (AAMA) on 03/09/2010   Method used:   Telephoned to ...         RxID:   [id number]

## 2010-07-29 NOTE — Progress Notes (Signed)
Summary: Rx Alprazolam  Phone Note Refill Request Call back at [phone] Message from:  [facility] on June 30, 2010 2:04 PM  Refills Requested: Medication #1:  XANAX 0.25 MG TABS Take 1/2 to 1 tablet by mouth two times a day  as needed for anxiety.   Last Refilled: 06/09/2010 Received faxed refill request please advise.   Method Requested: Telephone to Pharmacy Initial call taken by: Linde Gillis CMA Duncan Dull),  June 30, 2010 2:05 PM  Follow-up for Phone Call        please call in.  thanks.  Follow-up by: Crawford Givens MD,  June 30, 2010 10:40 PM  Additional Follow-up for Phone Call Additional follow up Details #1::        Medication phoned to pharmacy.  Additional Follow-up by: [name] CMA (AAMA),  July 01, 2010 8:39 AM    Prescriptions: XANAX 0.25 MG TABS (ALPRAZOLAM) Take 1/2 to 1 tablet by mouth two times a day  as needed for anxiety.  #30 x 1   Entered and Authorized by:   Crawford Givens MD   Signed by:   Crawford Givens MD on 06/30/2010   Method used:   Telephoned to ...       Hospital doctor (retail)       [address]       [place], [place]  [address]       Ph: [phone] or [phone]       Fax: [phone]   RxID:   [id number]

## 2010-07-29 NOTE — Letter (Signed)
Summary: Runell Gess MD/[facility]  Runell Gess MD/[facility]   Imported By: Lester [place] 07/20/2010 13:08:56  _____________________________________________________________________  External Attachment:    Type:   Image     Comment:   External Document

## 2010-07-29 NOTE — Progress Notes (Signed)
  Phone Note Outgoing Call   Summary of Call: please call patient.  I sent a message to Dr. Craige Cotta.  It is okay to use her current inhalers (don't change anything) and they'll let her know when they are able to set up pulm rehab.  Thanks.  Crawford Givens MD  July 07, 2010 12:44 PM     Follow-up for Phone Call        Patient Advised.  Follow-up by: [name] CMA Rodric Punch Dull),  July 07, 2010 2:59 PM

## 2010-07-29 NOTE — Progress Notes (Signed)
Summary: Lab order  Phone Note Call from Patient Call back at Home Phone [phone]   Caller: Patient Call For: Crawford Givens MD Summary of Call: Patient says she has made some phone calls as you suggested and [employer] tells her that if she goes to a lab, there will be no copay.  She says she'd like to go to [facility] which is close to her home.  She phoned them and they say that all they need is an order from you.  She says that now, her reading is 1 and they want it higher so she has to go back again in 2 weeks.  At first, she had to go every week for 3 weeks.  The address is [address] [place], [place] .  Phone:  [phone] Initial call taken by: [name] CMA Edgel Degnan Dull),  July 08, 2010 10:58 AM  Follow-up for Phone Call        Please send order for INR.  Needs to be checked due to h/o Afib.  Please verify current dose of coumadin/dosing schedule and have patient check INR in 2 weeks at the lab.   Follow-up by: Crawford Givens MD,  July 08, 2010 1:48 PM  Additional Follow-up for Phone Call Additional follow up Details #1::        Patient is taking Warfarin 5 mg. one and one half tablets daily until her next check in 2 weeks.  Prior to that, it was 1 tablet daily.  Order faxed to [facility] for 2 week recheck. Additional Follow-up by: [name] CMA (AAMA),  July 08, 2010 2:48 PM    New/Updated Medications: WARFARIN SODIUM 5 MG TABS (WARFARIN SODIUM) Take 1-1/2 tablets by mouth daily.

## 2010-07-29 NOTE — Assessment & Plan Note (Signed)
Summary: per pt call/lori had called pt to make an appt/cb   Copy to:  Koleen Nimrod Primary Ivry Pigue/Referring Bethanny Toelle:  Dr. Crawford Givens  CC:  Follow up to discuss PFT results.  Pt states breathing is worse.  Having increased SOB with any exertion.  Chest tigthness at times.  Denies wheezing and cough..  History of Present Illness: 77 yo female with dyspnea, chronic respiratory failure, COPD, diastolic dysfunction, OHS with probable OSA, and 2nd pulm. HTN.  She is here for follow up of ONO and PFT.  ONO showed that she maintained oxygenation with 2.5 liters.  Her PFT shows obstruction with bronchodilator response and diffusion defect.  She did feel like xopenex helped her breathing.    Current Medications (verified): 1)  Lopressor 50 Mg Tabs (Metoprolol Tartrate) .... 25 Mg.once Daily 2)  Nifedipine 30 Mg Xr24h-Tab (Nifedipine) .... Take 1 Tablet By Mouth Every Morning 3)  Furosemide 40 Mg Tabs (Furosemide) .... Take 1 Tablet By Mouth Two Times A Day 4)  Klor-Con 20 Meq Pack (Potassium Chloride) .... Take 1 Tablet By Mouth Every Morning 5)  Losartan Potassium 100 Mg Tabs (Losartan Potassium) .... Take 1 Tablet By Mouth Every Morning 6)  Imdur 60 Mg Xr24h-Tab (Isosorbide Mononitrate) .... Take 1 Tablet By Mouth Every Morning 7)  Aspirin 81 Mg  Tabs (Aspirin) .... Take 1 Tablet By Mouth Every Morning 8)  Plavix 75 Mg Tabs (Clopidogrel Bisulfate) .... Take 1 Tablet By Mouth Once A Day With A Meal. 9)  Crestor 5 Mg Tabs (Rosuvastatin Calcium) .Marland Kitchen.. 1 By Mouth On Mwf 10)  Humulin R 100 Unit/ml Soln (Insulin Regular Human) .... Per Sliding Scale: 150 or Less, No R. 151 or Higher, 3 Units.  >200, 6 Units 11)  Levemir Flexpen 100 Unit/ml Soln (Insulin Detemir) .... Take As Directed; Using 20 Units Two Times A Day As of 12/2009 12)  Protonix 40 Mg Tbec (Pantoprazole Sodium) .... Take 1 Tablet By Mouth Every Morning 13)  Onetouch Ultra Test  Strp (Glucose Blood) .... Check Blood  Sugar Twice A Day 14)  Xanax 0.25 Mg Tabs (Alprazolam) .... Take 1/2 To 1 Tablet By Mouth Two Times A Day  As Needed For Anxiety. 15)  Xopenex Hfa 45 Mcg/act Aero (Levalbuterol Tartrate) .... Two Puffs Up To Four Times Per Day As Needed  Allergies (verified): 1)  ! * Opiates 2)  ! Morphine 3)  ! Crestor 4)  ! Lipitor  Vital Signs:  Patient profile:   77 year old female Height:      61 inches Weight:      171.38 pounds BMI:     32.50 O2 Sat:      98 % on 2.5 L/minpulsed Temp:     97.5 degrees F oral Pulse rate:   47 / minute BP sitting:   112 / 76  (left arm) Cuff size:   regular  Vitals Entered By: Gweneth Dimitri RN (June 16, 2010 3:00 PM)  O2 Flow:  2.5 L/minpulsed CC: Follow up to discuss PFT results.  Pt states breathing is worse.  Having increased SOB with any exertion.  Chest tigthness at times.  Denies wheezing and cough. Comments Medications reviewed with patient Daytime contact number verified with patient. Gweneth Dimitri RN  June 16, 2010 3:01 PM    Physical Exam  General:  on supplemental oxygen and obese.   Nose:  narrow nasal angles, no discharge, no tenderness Mouth:  MP 4, enlarged tongue, scalloped border Neck:  no JVD.   Lungs:  diminished breath sounds, no wheezing, no rales, no dullness Heart:  regular rhythm, normal rate, and no murmurs.   Extremities:  minimal ankle edema, no cyanosis or clubbing Neurologic:  normal CN II-XII and strength normal.   Cervical Nodes:  no significant adenopathy Psych:  alert and cooperative; normal mood and affect; normal attention span and concentration   Past History:  Past Medical History: Full code, see note from 02/15/10 scanned  Coronary artery disease      - cath Aug. 2011>>medical therapy recommended Hyperlipidemia Hypertension Grade 2 diastolic dysfunction Chronic right bundle branch block Paroxysmal atrial fibrillation Chronic hypoxemic respiratory failure      - uses 2.5 liters oxygen with  sleep GOLD 4 COPD      - PFT 06/08/10 FEV1 0.93(62%), FEV1% 60, TLC 2.84(69%), DLCO 54%, +BD Secondary pulmonary hypertension Edema Diabetes mellitus, type II- goal A1c is 8, to avoid hypoglycemia Anxiety GERD  Physician roster: Cardiology - Nanetta Batty Pulmonary - Vineet Sood   Pre-Spirometry FVC    % Pred: 0 % FEV1    % Pred: 0 %  Impression & Recommendations:  Problem # 1:  DYSPNEA (ICD-786.05) Multifactorial related to diastolic dysfunction, probable OSA/OHS with chronic hypoxemic respiratory failure, COPD, deconditioning, and secondary pulmonary hypertension.  Problem # 2:  COPD (ICD-496) She has GOLD stage 4 COPD.  I reviewed her pulmonary function test with her.  She had good initial response to inhaler therapy.  Will have her start symbicort and continue as needed xopenex.  Will also arrange for pulmonary rehab.  I explained that there is no pulmonary contra-indication to her living independently or driving.  Problem # 3:  HYPERSOMNIA (ICD-780.54)  She has symptoms suggestive of sleep disordered breathing.  She had been recommended to have further testing of this, but has declined.  She would not want to use the therapy needed to treat sleep apnea.  Problem # 4:  CHRONIC RESPIRATORY FAILURE (ICD-518.83)  On the basis of OSA, OHS, and COPD.  Will continue with supplemental oxygen for now at 2.5 liters.  Problem # 5:  PULMONARY HYPERTENSION, SECONDARY (ICD-416.8)  Related to diastolic dysfunction, probable OSA/OHS, and possible COPD.  Will continue to optimize therapy for secondary causes as best as possible.  I do not think she would be an appropriate candidate for specific therapy directed at her pulmonary hypertension itself.  Medications Added to Medication List This Visit: 1)  Levemir Flexpen 100 Unit/ml Soln (Insulin detemir) .... Take as directed; using 20 units two times a day as of 12/2009 2)  Symbicort 160-4.5 Mcg/act Aero (Budesonide-formoterol fumarate)  .... Two puffs two times a day  Complete Medication List: 1)  Lopressor 50 Mg Tabs (Metoprolol tartrate) .... 25 mg.once daily 2)  Nifedipine 30 Mg Xr24h-tab (Nifedipine) .... Take 1 tablet by mouth every morning 3)  Furosemide 40 Mg Tabs (Furosemide) .... Take 1 tablet by mouth two times a day 4)  Klor-con 20 Meq Pack (Potassium chloride) .... Take 1 tablet by mouth every morning 5)  Losartan Potassium 100 Mg Tabs (Losartan potassium) .... Take 1 tablet by mouth every morning 6)  Imdur 60 Mg Xr24h-tab (Isosorbide mononitrate) .... Take 1 tablet by mouth every morning 7)  Aspirin 81 Mg Tabs (Aspirin) .... Take 1 tablet by mouth every morning 8)  Plavix 75 Mg Tabs (Clopidogrel bisulfate) .... Take 1 tablet by mouth once a day with a meal. 9)  Crestor 5 Mg Tabs (Rosuvastatin calcium) .Marland KitchenMarland KitchenMarland Kitchen 1  by mouth on mwf 10)  Humulin R 100 Unit/ml Soln (Insulin regular human) .... Per sliding scale: 150 or less, no r. 151 or higher, 3 units.  >200, 6 units 11)  Levemir Flexpen 100 Unit/ml Soln (Insulin detemir) .... Take as directed; using 20 units two times a day as of 12/2009 12)  Protonix 40 Mg Tbec (Pantoprazole sodium) .... Take 1 tablet by mouth every morning 13)  Onetouch Ultra Test Strp (Glucose blood) .... Check blood sugar twice a day 14)  Xanax 0.25 Mg Tabs (Alprazolam) .... Take 1/2 to 1 tablet by mouth two times a day  as needed for anxiety. 15)  Xopenex Hfa 45 Mcg/act Aero (Levalbuterol tartrate) .... Two puffs up to four times per day as needed 16)  Symbicort 160-4.5 Mcg/act Aero (Budesonide-formoterol fumarate) .... Two puffs two times a day  Other Orders: Est. Patient Level IV (54098) Rehabilitation Referral (Rehab)  Patient Instructions: 1)  Symbicort two puffs two times a day, and rinse mouth after using 2)  Xopenex two puffs up to four times per day as needed for cough, wheeze, sputum, or shortness of breath 3)  Will arrange for pulmonary rehab class 4)  Follow up in 2  months Prescriptions: XOPENEX HFA 45 MCG/ACT AERO (LEVALBUTEROL TARTRATE) two puffs up to four times per day as needed  #1 x 6   Entered and Authorized by:   [name] MD   Signed by:   [name] MD on 06/16/2010   Method used:   Print then Give to Patient   RxID:   [id number] SYMBICORT 160-4.5 MCG/ACT AERO (BUDESONIDE-FORMOTEROL FUMARATE) two puffs two times a day  #1 x 6   Entered and Authorized by:   [name] MD   Signed by:   [name] MD on 06/16/2010   Method used:   Print then Give to Patient   RxID:   [id number]

## 2010-07-29 NOTE — Miscellaneous (Signed)
  Clinical Lists Changes  Observations: Added new observation of PAST MED HX: DM2 HTN, pulmonary HTN HLD h/o edema Cath 8/11 at [facility] (Dr. Allyson Sabal), rec medical tx only w/o new stent ERF 50-55% as of 8/11 Full code, see note from 02/15/10 scanned (02/15/2010 22:23)      Past History:  Past Medical History: DM2 HTN, pulmonary HTN HLD h/o edema Cath 8/11 at [facility] (Dr. Allyson Sabal), rec medical tx only w/o new stent ERF 50-55% as of 8/11 Full code, see note from 02/15/10 scanned

## 2010-07-29 NOTE — Miscellaneous (Signed)
Summary: med list update- test strips  Clinical Lists Changes  Medications: Added new medication of ONETOUCH ULTRA TEST  STRP (GLUCOSE BLOOD) check blood sugar twice a day     Prior Medications: KLOR-CON 20 MEQ PACK (POTASSIUM CHLORIDE) Take 1 tablet by mouth every morning FUROSEMIDE 40 MG TABS (FUROSEMIDE) Take 1 tablet by mouth two times a day IMDUR 60 MG XR24H-TAB (ISOSORBIDE MONONITRATE) Take 1 tablet by mouth every morning LOSARTAN POTASSIUM 100 MG TABS (LOSARTAN POTASSIUM) Take 1 tablet by mouth every morning ASPIRIN 81 MG  TABS (ASPIRIN) Take 1 tablet by mouth every morning HUMULIN R 100 UNIT/ML SOLN (INSULIN REGULAR HUMAN) per sliding scale LEVEMIR FLEXPEN 100 UNIT/ML SOLN (INSULIN DETEMIR) take as directed; using 25 units two times a day as of 12/2009 LOPRESSOR 50 MG TABS (METOPROLOL TARTRATE) 25 mg. two times a day PLAVIX 75 MG TABS (CLOPIDOGREL BISULFATE) Take 1 tablet by mouth once a day with a meal. PROTONIX 40 MG TBEC (PANTOPRAZOLE SODIUM) Take 1 tablet by mouth every morning NIFEDIPINE 30 MG XR24H-TAB (NIFEDIPINE) Take 1 tablet by mouth every morning CRESTOR 5 MG TABS (ROSUVASTATIN CALCIUM) 1 by mouth on MWF ONETOUCH ULTRA TEST  STRP (GLUCOSE BLOOD) check blood sugar twice a day Current Allergies: ! * OPIATES ! MORPHINE ! CRESTOR ! LIPITOR

## 2010-07-29 NOTE — Assessment & Plan Note (Signed)
Summary: F/U/CLE   Vital Signs:  Patient profile:   77 year old female Height:      61 inches Weight:      166.25 pounds BMI:     31.53 Temp:     97.5 degrees F oral Pulse rate:   76 / minute Pulse rhythm:   regular BP sitting:   124 / 64  (left arm) Cuff size:   regular  Vitals Entered By: [name] CMA  Dull) (July 06, 2010 10:55 AM) CC: follow-up visit   History of Present Illness: COPD- prev saw Dr. Craige Cotta and I appreciate his help.  Needs help paying for symbicort.  Samples given today.  Wasn't able to get xopenex but has been using combivent once daily or two times a day  with relief.  I sent a flag to Dr. Craige Cotta and he was okay with this change.  Using O2 at night, off O2 at OV today.  Minimal cough.  No sputum or fevers reported.    AF- followed by [facility] (along with other CV problems) but patient is having trouble paying for INRs and is asking about alternatives.  She is happy with [facility] (as am I) but she wanted to know what can be done. Is going back for [facility] today at [facility].  No recent bleeding.    Has had recent family stress, with extended family members.  "I've realized that I have to look after myself and I've given the rest of it to God."  She appears to be comfortable with the decision to do so, if not with recent turn of events that led to the decision.    HTN- continues on current meds.  No CP. No edema. Compliant wiht meds.   Allergies: 1)  ! * Opiates 2)  ! Morphine 3)  ! Crestor 4)  ! Lipitor  Past History:  Past Medical History: Last updated: 06/16/2010 Full code, see note from 02/15/10 scanned  Coronary artery disease      - cath Aug. 2011>>medical therapy recommended Hyperlipidemia Hypertension Grade 2 diastolic dysfunction Chronic right bundle branch block Paroxysmal atrial fibrillation Chronic hypoxemic respiratory failure      - uses 2.5 liters oxygen with sleep GOLD 4 COPD      - PFT 06/08/10 FEV1 0.93(62%), FEV1% 60, TLC  2.84(69%), DLCO 54%, +BD Secondary pulmonary hypertension Edema Diabetes mellitus, type II- goal A1c is 8, to avoid hypoglycemia Anxiety GERD  Physician roster: Cardiology - Nanetta Batty Pulmonary - [name]  Social History: Last updated: 06/07/2010 Widowed, and lives alone.  Smoked 1.5 packs per day for 15 years, and quit in 1995.  Has occasional alcohol use.  Review of Systems       See HPI.  Otherwise negative.    Physical Exam  General:  initially tearful but then regains composure. no apparent distress normocephalic atraumatic mucous membranes moist clear to auscultation bilaterally w/o wheeze here today, no increase in wob regular rate and rhythm w/o irregularity ext w/o edema   Impression & Recommendations:  Problem # 1:  COPD (ICD-496) No change in meds.  Meds updated.  See HPI.  >25 min spent with patient, at least half of which was spent on counseling WJ:XBJY.  The following medications were removed from the medication list:    Xopenex Hfa 45 Mcg/act Aero (Levalbuterol tartrate) .Marland Kitchen..Marland Kitchen Two puffs up to four times per day as needed Her updated medication list for this problem includes:    Symbicort 160-4.5 Mcg/act Aero (Budesonide-formoterol  fumarate) .Marland Kitchen..Marland Kitchen Two puffs two times a day    Combivent 18-103 Mcg/act Aero (Ipratropium-albuterol) .Marland Kitchen... As needed  Problem # 2:  HYPERTENSION, BENIGN ESSENTIAL (ICD-401.1) BP controlled. No change in meds.  Her updated medication list for this problem includes:    Lopressor 50 Mg Tabs (Metoprolol tartrate) .Marland Kitchen... 25 mg.once daily    Nifedipine 30 Mg Xr24h-tab (Nifedipine) .Marland Kitchen... Take 1 tablet by mouth every morning    Furosemide 40 Mg Tabs (Furosemide) .Marland Kitchen... Take 1 tablet by mouth two times a day    Losartan Potassium 100 Mg Tabs (Losartan potassium) .Marland Kitchen... Take 1 tablet by mouth every morning  Problem # 3:  ATRIAL FIBRILLATION, PAROXYSMAL (ICD-427.31) No change in meds. She'll call [facility] about getting INR done at  their facility and will see what the copay would be there.  If it will save her money, I would be willing to have her INR done there and sent to me for me to address.  She is okay with this plan and will let me know what has gone on after she talks to [facility] and [facility] about it.   Her updated medication list for this problem includes:    Lopressor 50 Mg Tabs (Metoprolol tartrate) .Marland Kitchen... 25 mg.once daily    Nifedipine 30 Mg Xr24h-tab (Nifedipine) .Marland Kitchen... Take 1 tablet by mouth every morning    Aspirin 81 Mg Tabs (Aspirin) .Marland Kitchen... Take 1 tablet by mouth every morning    Plavix 75 Mg Tabs (Clopidogrel bisulfate) .Marland Kitchen... Take 1 tablet by mouth once a day with a meal.    Amiodarone Hcl 200 Mg Tabs (Amiodarone hcl) .Marland Kitchen... Take 1 tablet by mouth every morning    Warfarin Sodium 5 Mg Tabs (Warfarin sodium) .Marland Kitchen... As directed.  Complete Medication List: 1)  Lopressor 50 Mg Tabs (Metoprolol tartrate) .... 25 mg.once daily 2)  Nifedipine 30 Mg Xr24h-tab (Nifedipine) .... Take 1 tablet by mouth every morning 3)  Furosemide 40 Mg Tabs (Furosemide) .... Take 1 tablet by mouth two times a day 4)  Klor-con 20 Meq Pack (Potassium chloride) .... Take 1 tablet by mouth every morning 5)  Losartan Potassium 100 Mg Tabs (Losartan potassium) .... Take 1 tablet by mouth every morning 6)  Imdur 60 Mg Xr24h-tab (Isosorbide mononitrate) .... Take 1 tablet by mouth every morning 7)  Aspirin 81 Mg Tabs (Aspirin) .... Take 1 tablet by mouth every morning 8)  Plavix 75 Mg Tabs (Clopidogrel bisulfate) .... Take 1 tablet by mouth once a day with a meal. 9)  Crestor 5 Mg Tabs (Rosuvastatin calcium) .Marland Kitchen.. 1 by mouth on mwf 10)  Humulin R 100 Unit/ml Soln (Insulin regular human) .... Per sliding scale: 150 or less, no r. 151 or higher, 4 units.  >200, 8 units 11)  Levemir Flexpen 100 Unit/ml Soln (Insulin detemir) .... Take as directed; using 20 units two times a day as of 12/2009 12)  Protonix 40 Mg Tbec (Pantoprazole sodium) .... Take 1  tablet by mouth every morning 13)  Onetouch Ultra Test Strp (Glucose blood) .... Check blood sugar twice a day 14)  Xanax 0.25 Mg Tabs (Alprazolam) .... Take 1/2 to 1 tablet by mouth two times a day  as needed for anxiety. 15)  Symbicort 160-4.5 Mcg/act Aero (Budesonide-formoterol fumarate) .... Two puffs two times a day 16)  Preservision/lutein Caps (Multiple vitamins-minerals) .... Take 1 capsule by mouth once a day 17)  Combivent 18-103 Mcg/act Aero (Ipratropium-albuterol) .... As needed 18)  Amiodarone Hcl 200 Mg Tabs (Amiodarone hcl) .Marland KitchenMarland KitchenMarland Kitchen  Take 1 tablet by mouth every morning 19)  Warfarin Sodium 5 Mg Tabs (Warfarin sodium) .... As directed.  Patient Instructions: 1)  Please call the [facility] 800 number to see where you can get your "INR" done.  Tell them it is for your coumadin dose and you want to know if you'll have a copay.  If they can do it without a copay, let me know and we'll get you set.  We would have you get your blood drawn there and the I would check the result.  We'd let you know what to do with your coumadin dose soon after.  I'll send a note to Dr. Craige Cotta in the meantime.  Take care.  2)  Let's plan on meeting back in 3 months for a visit.  We can draw your A1c at that point.    Orders Added: 1)  Est. Patient Level IV [29528]    Current Allergies (reviewed today): ! * OPIATES ! MORPHINE ! CRESTOR ! LIPITOR

## 2010-07-29 NOTE — Assessment & Plan Note (Signed)
Summary: SOB//jrc   Copy to:  Koleen Nimrod Primary Provider/Referring Provider:  Dr. Crawford Givens  CC:  Pulmonary consult for dyspnea.  History of Present Illness: 77 yo female for evaluation of dyspnea.  She was recently hospitalized for shortness of breath.  She has a history chronic hypoxemic respiratory failure, possible COPD with remote tobacco use, diastolic dysfunction, and secondary pulmonary hypertension.  She was previously on revatio.  She was seen by pulmonary several years ago, but not since.  As a result she was referred by the hospitalist for further pulmonary evaluation.  She has been using oxygen at night for sometime.  She does not feel like she needs to use this during the day.  She has been experiencing trouble with her breathing for years, and feels this is getting worse.  She now gets winded after just walking a few feet.  She has been getting a bloating and full feeling in her upper abdomen.  She has noticed an increase in her weight associated with leg swelling.  She has been told that she has COPD.  She quit smoking in 1995.  She does not recall ever having PFT's before.  She has never tried inhalers for her breathing.  There is no history of asthma or allergies.  She does not have much cough or wheeze.  She denies significant sputum production.  She has noticed trouble with her sleep.  She feels sleepy during the day.  She does snore, and has to sleep propped up on several pillows.  She does not recall ever having a sleep test done before.  She had pneumonia years ago, but denies history of TB.  She denies any history of thrombo-embolic disease.  Her current contact number is [phone].  She is currently staying with her son-in-law.  Preventive Screening-Counseling & Management  Alcohol-Tobacco     Alcohol drinks/day: 1     Alcohol type: wine, spirits     Smoking Status: quit     Packs/Day: 1.5     Year Started: 1950     Year Quit:  1995  Current Medications (verified): 1)  Klor-Con 20 Meq Pack (Potassium Chloride) .... Take 1 Tablet By Mouth Every Morning 2)  Furosemide 40 Mg Tabs (Furosemide) .... Take 1 Tablet By Mouth Two Times A Day 3)  Imdur 60 Mg Xr24h-Tab (Isosorbide Mononitrate) .... Take 1 Tablet By Mouth Every Morning 4)  Losartan Potassium 100 Mg Tabs (Losartan Potassium) .... Take 1 Tablet By Mouth Every Morning 5)  Aspirin 81 Mg  Tabs (Aspirin) .... Take 1 Tablet By Mouth Every Morning 6)  Humulin R 100 Unit/ml Soln (Insulin Regular Human) .... Per Sliding Scale: 150 or Less, No R. 151 or Higher, 3 Units.  >200, 6 Units 7)  Levemir Flexpen 100 Unit/ml Soln (Insulin Detemir) .... Take As Directed; Using 25 Units Two Times A Day As of 12/2009 8)  Lopressor 50 Mg Tabs (Metoprolol Tartrate) .... 25 Mg.once Daily 9)  Plavix 75 Mg Tabs (Clopidogrel Bisulfate) .... Take 1 Tablet By Mouth Once A Day With A Meal. 10)  Protonix 40 Mg Tbec (Pantoprazole Sodium) .... Take 1 Tablet By Mouth Every Morning 11)  Nifedipine 30 Mg Xr24h-Tab (Nifedipine) .... Take 1 Tablet By Mouth Every Morning 12)  Crestor 5 Mg Tabs (Rosuvastatin Calcium) .Marland Kitchen.. 1 By Mouth On Mwf 13)  Onetouch Ultra Test  Strp (Glucose Blood) .... Check Blood Sugar Twice A Day 14)  Xanax 0.25 Mg Tabs (Alprazolam) .... Take 1/2  To 1 Tablet By Mouth Two Times A Day  As Needed For Anxiety.  Allergies (verified): 1)  ! * Opiates 2)  ! Morphine 3)  ! Crestor 4)  ! Lipitor  Past History:  Past Medical History: Full code, see note from 02/15/10 scanned  Coronary artery disease      - cath Aug. 2011>>medical therapy recommended Hyperlipidemia Hypertension Grade 2 diastolic dysfunction Chronic right bundle branch block Paroxysmal atrial fibrillation Chronic hypoxemic respiratory failure      - uses 2.5 liters oxygen with sleep Secondary pulmonary hypertension Edema Diabetes mellitus, type II- goal A1c is 8, to avoid  hypoglycemia Anxiety GERD  Physician roster: Cardiology - Nanetta Batty Pulmonary - [name]  Past Surgical History: Right rotator cuff repair Feb. 2007  Family History: Father - Emphysema, Asthma Mother - Asthma, Heart disease, Skin cancer Brother - Asthma Brother - Asthma  Social History: Widowed, and lives alone.  Smoked 1.5 packs per day for 15 years, and quit in 1995.  Has occasional alcohol use.  Alcohol drinks/day:  1 Smoking Status:  quit Packs/Day:  1.5  Review of Systems       The patient complains of shortness of breath with activity, chest pain, irregular heartbeats, indigestion, difficulty swallowing, nasal congestion/difficulty breathing through nose, anxiety, and hand/feet swelling.  The patient denies shortness of breath at rest, productive cough, non-productive cough, coughing up blood, acid heartburn, loss of appetite, weight change, abdominal pain, sore throat, tooth/dental problems, headaches, sneezing, itching, ear ache, depression, joint stiffness or pain, rash, change in color of mucus, and fever.    Vital Signs:  Patient profile:   77 year old female Height:      61 inches (154.94 cm) Weight:      167 pounds (75.91 kg) BMI:     31.67 O2 Sat:      95 % on Room air Temp:     97.5 degrees F (36.39 degrees C) oral Pulse rate:   52 / minute BP sitting:   100 / 62  (left arm) Cuff size:   large  Vitals Entered By: Michel Bickers CMA (June 07, 2010 11:52 AM)  O2 Sat at Rest %:  95 O2 Flow:  Room air  Serial Vital Signs/Assessments:  Comments: Ambulatory Pulse Oximetry  Resting; HR__46___    02 Sat__90%RA___  Lap1 (185 feet)   HR__57___   02 Sat__91%RA___ Lap2 (185 feet)   HR__67___   02 Sat__94%RA___    Lap3 (185 feet)   HR__70___   02 Sat__90%RA___  _x__Test Completed without Difficulty Zackery Barefoot CMA  June 07, 2010 12:37 PM  ___Test Stopped due to:   By: Zackery Barefoot CMA   CC: Pulmonary consult for dyspnea Is  Patient Diabetic? Yes Comments Medications reviewed with patient Michel Bickers [facility]  June 07, 2010 11:52 AM   Physical Exam  General:  normal appearance, healthy appearing, and obese.   Eyes:  PERRLA and EOMI.   Nose:  narrow nasal angles, no discharge, no tenderness Mouth:  MP 4, enlarged tongue, scalloped border Neck:  no JVD.   Chest Wall:  no deformities noted Lungs:  diminished breath sounds, no wheezing, no rales, no dullness Heart:  regular rhythm, normal rate, and no murmurs.   Abdomen:  obese, soft, nontender, no organomegaly, normal bowel sounds Pulses:  pulses normal Extremities:  minimal ankle edema, no cyanosis or clubbing Neurologic:  normal CN II-XII and strength normal.   Cervical Nodes:  no significant adenopathy Psych:  alert and  cooperative; normal mood and affect; normal attention span and concentration   Impression & Recommendations:  Problem # 1:  DYSPNEA (ICD-786.05) This is likely multifactorial related to diastolic dysfunction, probable OSA/OHS with chronic hypoxemic respiratory failure, possible COPD, deconditioning, and secondary pulmonary hypertension.  Of note also is that she was relatively bradycardic with exertion in my office today.  Will defer to cardiology whether her chronotropic medications may need to be adjusted to allow for increased heart rate with activity.  Problem # 2:  TOBACCO ABUSE, HX OF (ICD-V15.82) She has a prior history of tobacco abuse, and carries a diagnosis of COPD.  To further assess this I will arrange for pulmonary function testing, and give her an empiric trial of xopenex.  Problem # 3:  HYPERSOMNIA (ICD-780.54) She has symptoms suggestive of sleep disordered breathing.  She had been recommended to have further testing of this, but has declined.  She would not want to use the therapy needed to treat sleep apnea.  Problem # 4:  CHRONIC RESPIRATORY FAILURE (ICD-518.83) Likely on the basis of OSA, OHS, and possible COPD.   Will continue with supplemental oxygen for now at 2.5 liters and arrange for overnight oximetry on this setting.  Problem # 5:  DIASTOLIC DYSFUNCTION (ICD-429.9) She is followed by Dr. Allyson Sabal with cardiology.  Will request copy of medical records.  Problem # 6:  PULMONARY HYPERTENSION, SECONDARY (ICD-416.8) Related to diastolic dysfunction, probable OSA/OHS, and possible COPD.  Will continue to optimize therapy for secondary causes as best as possible.  I do not think she would be an appropriate candidate for specific therapy directed at her pulmonary hypertension itself.  Medications Added to Medication List This Visit: 1)  Lopressor 50 Mg Tabs (Metoprolol tartrate) .... 25 mg.once daily 2)  Xopenex Hfa 45 Mcg/act Aero (Levalbuterol tartrate) .... Two puffs up to four times per day as needed  Complete Medication List: 1)  Lopressor 50 Mg Tabs (Metoprolol tartrate) .... 25 mg.once daily 2)  Nifedipine 30 Mg Xr24h-tab (Nifedipine) .... Take 1 tablet by mouth every morning 3)  Furosemide 40 Mg Tabs (Furosemide) .... Take 1 tablet by mouth two times a day 4)  Klor-con 20 Meq Pack (Potassium chloride) .... Take 1 tablet by mouth every morning 5)  Losartan Potassium 100 Mg Tabs (Losartan potassium) .... Take 1 tablet by mouth every morning 6)  Imdur 60 Mg Xr24h-tab (Isosorbide mononitrate) .... Take 1 tablet by mouth every morning 7)  Aspirin 81 Mg Tabs (Aspirin) .... Take 1 tablet by mouth every morning 8)  Plavix 75 Mg Tabs (Clopidogrel bisulfate) .... Take 1 tablet by mouth once a day with a meal. 9)  Crestor 5 Mg Tabs (Rosuvastatin calcium) .Marland Kitchen.. 1 by mouth on mwf 10)  Humulin R 100 Unit/ml Soln (Insulin regular human) .... Per sliding scale: 150 or less, no r. 151 or higher, 3 units.  >200, 6 units 11)  Levemir Flexpen 100 Unit/ml Soln (Insulin detemir) .... Take as directed; using 25 units two times a day as of 12/2009 12)  Protonix 40 Mg Tbec (Pantoprazole sodium) .... Take 1 tablet by mouth  every morning 13)  Onetouch Ultra Test Strp (Glucose blood) .... Check blood sugar twice a day 14)  Xanax 0.25 Mg Tabs (Alprazolam) .... Take 1/2 to 1 tablet by mouth two times a day  as needed for anxiety. 15)  Xopenex Hfa 45 Mcg/act Aero (Levalbuterol tartrate) .... Two puffs up to four times per day as needed  Other Orders: Consultation Level  IV (16109) Full Pulmonary Function Test (PFT) DME Referral (DME)  Patient Instructions: 1)  Xopenex two puffs up to four times per day as needed  2)  Will schedule breathing test (PFT) 3)  Will schedule oxygen test at night 4)  Follow up in 2 to 3 weeks

## 2010-08-06 ENCOUNTER — Telehealth: Payer: Self-pay | Admitting: Family Medicine

## 2010-08-11 ENCOUNTER — Ambulatory Visit: Payer: Self-pay | Admitting: Pulmonary Disease

## 2010-08-11 ENCOUNTER — Ambulatory Visit (INDEPENDENT_AMBULATORY_CARE_PROVIDER_SITE_OTHER): Payer: Medicare Other | Admitting: Family Medicine

## 2010-08-11 ENCOUNTER — Encounter: Payer: Self-pay | Admitting: Family Medicine

## 2010-08-11 DIAGNOSIS — R21 Rash and other nonspecific skin eruption: Secondary | ICD-10-CM | POA: Insufficient documentation

## 2010-08-12 NOTE — Progress Notes (Signed)
----   Converted from flag ----  noted.  ---- 08/06/2010 3:26 PM, [name] CMA (AAMA) wrote: She said when she went to get it drawn, the lab asked about sending it to [facility] and she told them you had graciously agreed to order it so that she would not have to pay $30 each time.  The results apparently went to [facility] and they phoned her yesterday with her instructions.  [facility] then told her that they would set it up for her to go to this independent lab so she said they will be monitoring it for her.  ---- 08/04/2010 1:04 PM, Crawford Givens MD wrote: Pt should be due for INR.  Please call her about getting this done.  She should have a standing order.  She may have had the lab drawn already, but it hasn't come back to me yet.  thanks. ------------------------------

## 2010-08-18 ENCOUNTER — Ambulatory Visit: Payer: Self-pay | Admitting: Family Medicine

## 2010-08-18 NOTE — Assessment & Plan Note (Signed)
Summary: follow up on meds/ rash on arm/alc   Vital Signs:  Patient profile:   77 year old female Height:      61 inches Weight:      169.25 pounds BMI:     32.10 Temp:     97.6 degrees F oral Pulse rate:   84 / minute Pulse rhythm:   regular BP sitting:   122 / 60  (left arm) Cuff size:   regular  Vitals Entered By: [name] CMA  Dull) (August 11, 2010 2:38 PM) CC: Follow up on meds.  Rash on arm.     History of Present Illness: Itching rash on bilateral arms.  Bleeding if she scratches them.  Variable timing of lesion erupting.  No fevers.  Has had some nosebleeds.  Her INR is now followed by cards and was drawn this AM.  Continued financial and family stress and this is taxing the patient.    Allergies: 1)  ! * Opiates 2)  ! Morphine 3)  ! Crestor 4)  ! Lipitor  Review of Systems       See HPI.  Otherwise negative.    Physical Exam  General:  no apparent distress normocephalic atraumatic no active bleed on nasal exam but clot adhered in L nostril mucous membranes moist regular rate and rhythm clear to auscultation bilaterally w/o decrease in bs focally bilateral arms with lesion of varying stages of healing.  variably excoriated, some with adherent scab.  no lesion on the palms or between the fingers. lesions with scabs are a few mm across, all less then 1cm.  several lesions w/o scabs are slightly larger- excorated, papular, and blanching.  nondermatomal distribution   Impression & Recommendations:  Problem # 1:  SKIN RASH (ICD-782.1) Unclear source.  I don't know how much the itch/scratch cycle, dry skin, and even a component of anxiety could play in this.  I would cover the scabbed lesions with neosporing and bandaid daily and treat the others with topical hydrocortisone to help with the itching. These don't appear typical for scabies, but I don't know the exact source.  She is nontoxic and will follow up as needed.  for the nosebleed- she is stable based  on exam and awaiting input from cards re:her INR, so I didn't recheck it.  I advised her not to blow her nose.  She understood. I offered support re: her family situation.  She is trying to work through this.    Complete Medication List: 1)  Lopressor 50 Mg Tabs (Metoprolol tartrate) .... Take 1/2 tablet by mouth two times a day 2)  Nifedipine 30 Mg Xr24h-tab (Nifedipine) .... Take 1 tablet by mouth every morning 3)  Furosemide 40 Mg Tabs (Furosemide) .... Take 1 tablet by mouth two times a day 4)  Klor-con 20 Meq Pack (Potassium chloride) .... Take 1 tablet by mouth every morning 5)  Losartan Potassium 100 Mg Tabs (Losartan potassium) .... Take 1 tablet by mouth every morning 6)  Imdur 60 Mg Xr24h-tab (Isosorbide mononitrate) .... Take 1 tablet by mouth every morning 7)  Aspirin 81 Mg Tabs (Aspirin) .... Take 1 tablet by mouth every morning 8)  Plavix 75 Mg Tabs (Clopidogrel bisulfate) .... Take 1 tablet by mouth once a day with a meal. 9)  Crestor 5 Mg Tabs (Rosuvastatin calcium) .Marland Kitchen.. 1 by mouth on mwf 10)  Humulin R 100 Unit/ml Soln (Insulin regular human) .... Per sliding scale: 150 or less, no r. 151 or higher, 4 units.  >  200, 8 units 11)  Levemir Flexpen 100 Unit/ml Soln (Insulin detemir) .... Take as directed; using 20 units two times a day as of 12/2009 12)  Protonix 40 Mg Tbec (Pantoprazole sodium) .... Take 1 tablet by mouth every morning 13)  Onetouch Ultra Test Strp (Glucose blood) .... Check blood sugar twice a day 14)  Xanax 0.25 Mg Tabs (Alprazolam) .... Take 1/2 to 1 tablet by mouth two times a day  as needed for anxiety. 15)  Symbicort 160-4.5 Mcg/act Aero (Budesonide-formoterol fumarate) .... Two puffs two times a day 16)  Preservision/lutein Caps (Multiple vitamins-minerals) .... Take 1 capsule by mouth once a day 17)  Combivent 18-103 Mcg/act Aero (Ipratropium-albuterol) .... As needed 18)  Amiodarone Hcl 200 Mg Tabs (Amiodarone hcl) .... Take 1 tablet by mouth every  morning 19)  Warfarin Sodium 5 Mg Tabs (Warfarin sodium) .... Take 1-1/2 tablets by mouth daily except 2 tablets by mouth on wednesdays and sundays.  Patient Instructions: 1)  I would keep the areas bandaged with neosporin.  The areas that haven't bleed can be treated with hydrocortisone several times a day (to help with the itching).  Don't blow your nose.  The other clinic should be in touch with you about your warfarin dose.  Take care.    Orders Added: 1)  Est. Patient Level III [16109]    Current Allergies (reviewed today): ! * OPIATES ! MORPHINE ! CRESTOR ! LIPITOR

## 2010-08-23 ENCOUNTER — Telehealth: Payer: Self-pay | Admitting: Family Medicine

## 2010-08-31 ENCOUNTER — Encounter (HOSPITAL_COMMUNITY): Payer: Medicare Other | Attending: Pulmonary Disease

## 2010-08-31 DIAGNOSIS — Z5189 Encounter for other specified aftercare: Secondary | ICD-10-CM | POA: Insufficient documentation

## 2010-08-31 DIAGNOSIS — I10 Essential (primary) hypertension: Secondary | ICD-10-CM | POA: Insufficient documentation

## 2010-08-31 DIAGNOSIS — I4891 Unspecified atrial fibrillation: Secondary | ICD-10-CM | POA: Insufficient documentation

## 2010-08-31 DIAGNOSIS — I2789 Other specified pulmonary heart diseases: Secondary | ICD-10-CM | POA: Insufficient documentation

## 2010-08-31 DIAGNOSIS — I519 Heart disease, unspecified: Secondary | ICD-10-CM | POA: Insufficient documentation

## 2010-08-31 DIAGNOSIS — E119 Type 2 diabetes mellitus without complications: Secondary | ICD-10-CM | POA: Insufficient documentation

## 2010-08-31 DIAGNOSIS — E785 Hyperlipidemia, unspecified: Secondary | ICD-10-CM | POA: Insufficient documentation

## 2010-08-31 DIAGNOSIS — J449 Chronic obstructive pulmonary disease, unspecified: Secondary | ICD-10-CM | POA: Insufficient documentation

## 2010-08-31 DIAGNOSIS — G4710 Hypersomnia, unspecified: Secondary | ICD-10-CM | POA: Insufficient documentation

## 2010-08-31 DIAGNOSIS — J9610 Chronic respiratory failure, unspecified whether with hypoxia or hypercapnia: Secondary | ICD-10-CM | POA: Insufficient documentation

## 2010-08-31 DIAGNOSIS — Z87891 Personal history of nicotine dependence: Secondary | ICD-10-CM | POA: Insufficient documentation

## 2010-08-31 DIAGNOSIS — J4489 Other specified chronic obstructive pulmonary disease: Secondary | ICD-10-CM | POA: Insufficient documentation

## 2010-09-01 ENCOUNTER — Ambulatory Visit (INDEPENDENT_AMBULATORY_CARE_PROVIDER_SITE_OTHER): Payer: Medicare Other | Admitting: Pulmonary Disease

## 2010-09-01 ENCOUNTER — Encounter: Payer: Self-pay | Admitting: Pulmonary Disease

## 2010-09-01 DIAGNOSIS — G4710 Hypersomnia, unspecified: Secondary | ICD-10-CM

## 2010-09-01 DIAGNOSIS — J9610 Chronic respiratory failure, unspecified whether with hypoxia or hypercapnia: Secondary | ICD-10-CM

## 2010-09-01 DIAGNOSIS — I2789 Other specified pulmonary heart diseases: Secondary | ICD-10-CM

## 2010-09-01 DIAGNOSIS — J449 Chronic obstructive pulmonary disease, unspecified: Secondary | ICD-10-CM

## 2010-09-02 ENCOUNTER — Encounter (HOSPITAL_COMMUNITY): Payer: Medicare Other

## 2010-09-02 NOTE — Progress Notes (Signed)
Summary: wants referral to dermatologist asap  Phone Note Call from Patient Call back at Home Phone [phone]   Caller: Patient Call For: Crawford Givens MD Summary of Call: Pt reports that the places on her arms and shoulders have opened up and are bleeding.  She is asking for a referral to Dr. Terri Piedra, they told her they can work her in this week if she has referral.  Please advise asap, pt is upset. Initial call taken by: Lowella Petties CMA, AAMA,  August 23, 2010 9:21 AM  Follow-up for Phone Call        please refer to Dr. Terri Piedra.  Thanks.  Follow-up by: Crawford Givens MD,  August 23, 2010 9:25 AM

## 2010-09-06 LAB — DIFFERENTIAL
Basophils Absolute: 0.1 10*3/uL (ref 0.0–0.1)
Basophils Relative: 1 % (ref 0–1)
Monocytes Absolute: 0.5 10*3/uL (ref 0.1–1.0)
Neutro Abs: 4.2 10*3/uL (ref 1.7–7.7)
Neutrophils Relative %: 63 % (ref 43–77)

## 2010-09-06 LAB — POCT I-STAT 3, ART BLOOD GAS (G3+)
Bicarbonate: 28.8 mEq/L — ABNORMAL HIGH (ref 20.0–24.0)
TCO2: 30 mmol/L (ref 0–100)
pCO2 arterial: 43.7 mmHg (ref 35.0–45.0)
pH, Arterial: 7.427 — ABNORMAL HIGH (ref 7.350–7.400)
pO2, Arterial: 66 mmHg — ABNORMAL LOW (ref 80.0–100.0)

## 2010-09-06 LAB — CBC
MCHC: 31.9 g/dL (ref 30.0–36.0)
Platelets: 227 10*3/uL (ref 150–400)
RDW: 13.7 % (ref 11.5–15.5)
WBC: 6.7 10*3/uL (ref 4.0–10.5)

## 2010-09-06 LAB — POCT CARDIAC MARKERS: Myoglobin, poc: 132 ng/mL (ref 12–200)

## 2010-09-06 LAB — BASIC METABOLIC PANEL
BUN: 18 mg/dL (ref 6–23)
Calcium: 8.4 mg/dL (ref 8.4–10.5)
Creatinine, Ser: 1.32 mg/dL — ABNORMAL HIGH (ref 0.4–1.2)
GFR calc non Af Amer: 39 mL/min — ABNORMAL LOW (ref >60)
Glucose, Bld: 236 mg/dL — ABNORMAL HIGH (ref 70–99)

## 2010-09-06 LAB — BRAIN NATRIURETIC PEPTIDE: Pro B Natriuretic peptide (BNP): <30 pg/mL (ref 0.0–100.0)

## 2010-09-06 LAB — PROTIME-INR: Prothrombin Time: 15 seconds (ref 11.6–15.2)

## 2010-09-07 ENCOUNTER — Other Ambulatory Visit: Payer: Self-pay

## 2010-09-07 ENCOUNTER — Encounter (HOSPITAL_COMMUNITY): Payer: Medicare Other

## 2010-09-07 LAB — BASIC METABOLIC PANEL
BUN: 24 mg/dL — ABNORMAL HIGH (ref 6–23)
CO2: 30 mEq/L (ref 19–32)
CO2: 32 mEq/L (ref 19–32)
CO2: 32 mEq/L (ref 19–32)
Calcium: 9.1 mg/dL (ref 8.4–10.5)
Calcium: 9.4 mg/dL (ref 8.4–10.5)
Calcium: 9.6 mg/dL (ref 8.4–10.5)
Calcium: 9.6 mg/dL (ref 8.4–10.5)
Chloride: 97 mEq/L (ref 96–112)
Creatinine, Ser: 1.3 mg/dL — ABNORMAL HIGH (ref 0.4–1.2)
GFR calc Af Amer: 37 mL/min — ABNORMAL LOW (ref >60)
GFR calc Af Amer: 45 mL/min — ABNORMAL LOW (ref >60)
GFR calc non Af Amer: 37 mL/min — ABNORMAL LOW (ref >60)
GFR calc non Af Amer: 39 mL/min — ABNORMAL LOW (ref >60)
Glucose, Bld: 147 mg/dL — ABNORMAL HIGH (ref 70–99)
Glucose, Bld: 91 mg/dL (ref 70–99)
Potassium: 3.4 mEq/L — ABNORMAL LOW (ref 3.5–5.1)
Potassium: 3.6 mEq/L (ref 3.5–5.1)
Sodium: 137 mEq/L (ref 135–145)
Sodium: 139 mEq/L (ref 135–145)
Sodium: 141 mEq/L (ref 135–145)

## 2010-09-07 LAB — PROTIME-INR
INR: 0.95 (ref 0.00–1.49)
INR: 0.99 (ref 0.00–1.49)
INR: 1.02 (ref 0.00–1.49)
INR: 1.03 (ref 0.00–1.49)
INR: 1.94 — ABNORMAL HIGH (ref 0.00–1.49)
Prothrombin Time: 12.7 seconds (ref 11.6–15.2)
Prothrombin Time: 12.9 seconds (ref 11.6–15.2)
Prothrombin Time: 13.3 seconds (ref 11.6–15.2)
Prothrombin Time: 13.6 seconds (ref 11.6–15.2)
Prothrombin Time: 13.7 seconds (ref 11.6–15.2)
Prothrombin Time: 14.3 seconds (ref 11.6–15.2)
Prothrombin Time: 17.3 seconds — ABNORMAL HIGH (ref 11.6–15.2)
Prothrombin Time: 21.9 seconds — ABNORMAL HIGH (ref 11.6–15.2)
Prothrombin Time: 22.3 seconds — ABNORMAL HIGH (ref 11.6–15.2)

## 2010-09-07 LAB — CBC
HCT: 33 % — ABNORMAL LOW (ref 36.0–46.0)
HCT: 34.2 % — ABNORMAL LOW (ref 36.0–46.0)
HCT: 34.3 % — ABNORMAL LOW (ref 36.0–46.0)
HCT: 38.5 % (ref 36.0–46.0)
Hemoglobin: 10.4 g/dL — ABNORMAL LOW (ref 12.0–15.0)
Hemoglobin: 10.6 g/dL — ABNORMAL LOW (ref 12.0–15.0)
Hemoglobin: 10.7 g/dL — ABNORMAL LOW (ref 12.0–15.0)
Hemoglobin: 10.9 g/dL — ABNORMAL LOW (ref 12.0–15.0)
Hemoglobin: 11.4 g/dL — ABNORMAL LOW (ref 12.0–15.0)
Hemoglobin: 11.6 g/dL — ABNORMAL LOW (ref 12.0–15.0)
Hemoglobin: 12 g/dL (ref 12.0–15.0)
Hemoglobin: 12 g/dL (ref 12.0–15.0)
MCH: 29.1 pg (ref 26.0–34.0)
MCH: 29.5 pg (ref 26.0–34.0)
MCH: 29.6 pg (ref 26.0–34.0)
MCH: 29.8 pg (ref 26.0–34.0)
MCHC: 31.2 g/dL (ref 30.0–36.0)
MCHC: 31.5 g/dL (ref 30.0–36.0)
MCHC: 32.2 g/dL (ref 30.0–36.0)
MCV: 93.4 fL (ref 78.0–100.0)
MCV: 93.6 fL (ref 78.0–100.0)
MCV: 93.9 fL (ref 78.0–100.0)
Platelets: 195 10*3/uL (ref 150–400)
RBC: 3.6 MIL/uL — ABNORMAL LOW (ref 3.87–5.11)
RBC: 3.64 MIL/uL — ABNORMAL LOW (ref 3.87–5.11)
RBC: 3.83 MIL/uL — ABNORMAL LOW (ref 3.87–5.11)
RBC: 3.92 MIL/uL (ref 3.87–5.11)
RBC: 4.11 MIL/uL (ref 3.87–5.11)
RDW: 13.2 % (ref 11.5–15.5)
RDW: 13.5 % (ref 11.5–15.5)
WBC: 6.8 10*3/uL (ref 4.0–10.5)
WBC: 7.2 10*3/uL (ref 4.0–10.5)
WBC: 7.8 10*3/uL (ref 4.0–10.5)

## 2010-09-07 LAB — HEPARIN LEVEL (UNFRACTIONATED)
Heparin Unfractionated: 0.15 IU/mL — ABNORMAL LOW (ref 0.30–0.70)
Heparin Unfractionated: 0.28 IU/mL — ABNORMAL LOW (ref 0.30–0.70)

## 2010-09-07 LAB — GLUCOSE, CAPILLARY
Glucose-Capillary: 106 mg/dL — ABNORMAL HIGH (ref 70–99)
Glucose-Capillary: 110 mg/dL — ABNORMAL HIGH (ref 70–99)
Glucose-Capillary: 146 mg/dL — ABNORMAL HIGH (ref 70–99)
Glucose-Capillary: 147 mg/dL — ABNORMAL HIGH (ref 70–99)
Glucose-Capillary: 183 mg/dL — ABNORMAL HIGH (ref 70–99)
Glucose-Capillary: 193 mg/dL — ABNORMAL HIGH (ref 70–99)
Glucose-Capillary: 213 mg/dL — ABNORMAL HIGH (ref 70–99)
Glucose-Capillary: 224 mg/dL — ABNORMAL HIGH (ref 70–99)
Glucose-Capillary: 225 mg/dL — ABNORMAL HIGH (ref 70–99)
Glucose-Capillary: 227 mg/dL — ABNORMAL HIGH (ref 70–99)
Glucose-Capillary: 74 mg/dL (ref 70–99)
Glucose-Capillary: 76 mg/dL (ref 70–99)
Glucose-Capillary: 81 mg/dL (ref 70–99)
Glucose-Capillary: 97 mg/dL (ref 70–99)

## 2010-09-07 LAB — BRAIN NATRIURETIC PEPTIDE: Pro B Natriuretic peptide (BNP): 67 pg/mL (ref 0.0–100.0)

## 2010-09-07 NOTE — Assessment & Plan Note (Signed)
Summary: 2 month rov/sh   Copy to:  Andree Coss Primary Provider/Referring Provider:  Dr. Crawford Givens  CC:  2 month follow up. Pt states she has had an increased sob. Pt states she will start pulmonary rehab next week.  History of Present Illness: 77 yo female with dyspnea, chronic respiratory failure, COPD, diastolic dysfunction, OHS with probable OSA, and 2nd pulm. HTN.  She feels her breathing is better.  She still gets winded if she pushes herself too much. She is not having much cough or wheeze.  She is using combivent once or twice per day.  She is using oxygen mostly at night.  She is going to start pulmonary rehab later this month.  Current Medications (verified): 1)  Lopressor 50 Mg Tabs (Metoprolol Tartrate) .... Take 1/2 Tablet By Mouth Two Times A Day 2)  Nifedipine 30 Mg Xr24h-Tab (Nifedipine) .... Take 1 Tablet By Mouth Every Morning 3)  Furosemide 40 Mg Tabs (Furosemide) .... Take 1 Tablet By Mouth Two Times A Day 4)  Klor-Con 20 Meq Pack (Potassium Chloride) .... Take 1 Tablet By Mouth Every Morning 5)  Losartan Potassium 100 Mg Tabs (Losartan Potassium) .... Take 1 Tablet By Mouth Every Morning 6)  Imdur 60 Mg Xr24h-Tab (Isosorbide Mononitrate) .... Take 1 Tablet By Mouth Every Morning 7)  Aspirin 81 Mg  Tabs (Aspirin) .... Take 1 Tablet By Mouth Every Morning 8)  Plavix 75 Mg Tabs (Clopidogrel Bisulfate) .... Take 1 Tablet By Mouth Once A Day With A Meal. 9)  Crestor 5 Mg Tabs (Rosuvastatin Calcium) .Marland Kitchen.. 1 By Mouth On Mwf 10)  Humulin R 100 Unit/ml Soln (Insulin Regular Human) .... Per Sliding Scale: 150 or Less, No R. 151 or Higher, 4 Units.  >200, 8 Units 11)  Levemir Flexpen 100 Unit/ml Soln (Insulin Detemir) .... Take As Directed; Using 20 Units Two Times A Day As of 12/2009 12)  Protonix 40 Mg Tbec (Pantoprazole Sodium) .... Take 1 Tablet By Mouth Every Morning 13)  Onetouch Ultra Test  Strp (Glucose Blood) .... Check Blood Sugar Twice A Day 14)  Xanax 0.25 Mg  Tabs (Alprazolam) .... Take 1/2 To 1 Tablet By Mouth Two Times A Day  As Needed For Anxiety. 15)  Symbicort 160-4.5 Mcg/act Aero (Budesonide-Formoterol Fumarate) .... Two Puffs Two Times A Day 16)  Preservision/lutein  Caps (Multiple Vitamins-Minerals) .... Take 1 Capsule By Mouth Once A Day 17)  Combivent 18-103 Mcg/act Aero (Ipratropium-Albuterol) .... As Needed 18)  Amiodarone Hcl 200 Mg Tabs (Amiodarone Hcl) .... Take 1 Tablet By Mouth Every Morning 19)  Warfarin Sodium 5 Mg Tabs (Warfarin Sodium) .... Take 1-1/2 Tablets By Mouth Daily Except 2 Tablets By Mouth On Wednesdays and Sundays.  Allergies: 1)  ! * Opiates 2)  ! Codeine 3)  ! Crestor 4)  ! Lipitor  Past History:  Past Medical History: Reviewed history from 06/16/2010 and no changes required. Full code, see note from 02/15/10 scanned  Coronary artery disease      - cath Aug. 2011>>medical therapy recommended Hyperlipidemia Hypertension Grade 2 diastolic dysfunction Chronic right bundle branch block Paroxysmal atrial fibrillation Chronic hypoxemic respiratory failure      - uses 2.5 liters oxygen with sleep GOLD 4 COPD      - PFT 06/08/10 FEV1 0.93(62%), FEV1% 60, TLC 2.84(69%), DLCO 54%, +BD Secondary pulmonary hypertension Edema Diabetes mellitus, type II- goal A1c is 8, to avoid hypoglycemia Anxiety GERD  Physician roster: Cardiology - Nanetta Batty Pulmonary - [name]  Past Surgical History: Reviewed history from 06/07/2010 and no changes required. Right rotator cuff repair Feb. 2007  Vital Signs:  Patient profile:   77 year old female Height:      61 inches Weight:      170.38 pounds BMI:     32.31 O2 Sat:      98 % on Room air Temp:     97.7 degrees F oral Pulse rate:   60 / minute BP sitting:   122 / 74  (right arm) Cuff size:   regular  Vitals Entered By: Carver Fila (September 01, 2010 10:32 AM)  O2 Flow:  Room air CC: 2 month follow up. Pt states she has had an increased sob. Pt states  she will start pulmonary rehab next week Comments meds and allergies updated Phone number updated Carver Fila  September 01, 2010 10:33 AM    Physical Exam  General:  obese.   Nose:  narrow nasal angles, no discharge, no tenderness Mouth:  MP 4, enlarged tongue, scalloped border Neck:  no JVD.   Lungs:  diminished breath sounds, no wheezing, no rales, no dullness Heart:  regular rhythm, normal rate, and no murmurs.   Extremities:  minimal ankle edema, no cyanosis or clubbing Neurologic:  normal CN II-XII.   Cervical Nodes:  no significant adenopathy   Impression & Recommendations:  Problem # 1:  COPD (ICD-496) She has improvement with inhaler therapy.  Will continue symbicort and as needed combivent.  She is to start pulmonary rehab later this month.  Problem # 2:  HYPERSOMNIA (ICD-780.54)  She has symptoms suggestive of sleep disordered breathing.  She had been recommended to have further testing of this, but has declined.  She would not want to use the therapy needed to treat sleep apnea.  Problem # 3:  CHRONIC RESPIRATORY FAILURE (ICD-518.83)  On the basis of OSA, OHS, and COPD.  Will continue with supplemental oxygen for now at 2.5 liters.  Problem # 4:  PULMONARY HYPERTENSION, SECONDARY (ICD-416.8)  Related to diastolic dysfunction, probable OSA/OHS, and possible COPD.  Will continue to optimize therapy for secondary causes as best as possible.  I do not think she would be an appropriate candidate for specific therapy directed at her pulmonary hypertension itself.  Complete Medication List: 1)  Lopressor 50 Mg Tabs (Metoprolol tartrate) .... Take 1/2 tablet by mouth two times a day 2)  Nifedipine 30 Mg Xr24h-tab (Nifedipine) .... Take 1 tablet by mouth every morning 3)  Furosemide 40 Mg Tabs (Furosemide) .... Take 1 tablet by mouth two times a day 4)  Klor-con 20 Meq Pack (Potassium chloride) .... Take 1 tablet by mouth every morning 5)  Losartan Potassium 100 Mg Tabs  (Losartan potassium) .... Take 1 tablet by mouth every morning 6)  Imdur 60 Mg Xr24h-tab (Isosorbide mononitrate) .... Take 1 tablet by mouth every morning 7)  Aspirin 81 Mg Tabs (Aspirin) .... Take 1 tablet by mouth every morning 8)  Plavix 75 Mg Tabs (Clopidogrel bisulfate) .... Take 1 tablet by mouth once a day with a meal. 9)  Crestor 5 Mg Tabs (Rosuvastatin calcium) .Marland Kitchen.. 1 by mouth on mwf 10)  Humulin R 100 Unit/ml Soln (Insulin regular human) .... Per sliding scale: 150 or less, no r. 151 or higher, 4 units.  >200, 8 units 11)  Levemir Flexpen 100 Unit/ml Soln (Insulin detemir) .... Take as directed; using 20 units two times a day as of 12/2009 12)  Protonix 40 Mg  Tbec (Pantoprazole sodium) .... Take 1 tablet by mouth every morning 13)  Onetouch Ultra Test Strp (Glucose blood) .... Check blood sugar twice a day 14)  Xanax 0.25 Mg Tabs (Alprazolam) .... Take 1/2 to 1 tablet by mouth two times a day  as needed for anxiety. 15)  Symbicort 160-4.5 Mcg/act Aero (Budesonide-formoterol fumarate) .... Two puffs two times a day 16)  Preservision/lutein Caps (Multiple vitamins-minerals) .... Take 1 capsule by mouth once a day 17)  Combivent 18-103 Mcg/act Aero (Ipratropium-albuterol) .... As needed 18)  Amiodarone Hcl 200 Mg Tabs (Amiodarone hcl) .... Take 1 tablet by mouth every morning 19)  Warfarin Sodium 5 Mg Tabs (Warfarin sodium) .... Take 1-1/2 tablets by mouth daily except 2 tablets by mouth on wednesdays and sundays.  Other Orders: Est. Patient Level III (04540)  Patient Instructions: 1)  Follow up in 4 months

## 2010-09-08 LAB — GLUCOSE, CAPILLARY
Glucose-Capillary: 117 mg/dL — ABNORMAL HIGH (ref 70–99)
Glucose-Capillary: 113 mg/dL — ABNORMAL HIGH (ref 70–99)
Glucose-Capillary: 196 mg/dL — ABNORMAL HIGH (ref 70–99)
Glucose-Capillary: 201 mg/dL — ABNORMAL HIGH (ref 70–99)
Glucose-Capillary: 202 mg/dL — ABNORMAL HIGH (ref 70–99)

## 2010-09-08 LAB — POCT CARDIAC MARKERS
CKMB, poc: <1 ng/mL — ABNORMAL LOW (ref 1.0–8.0)
Myoglobin, poc: 70.3 ng/mL (ref 12–200)

## 2010-09-08 LAB — POCT I-STAT, CHEM 8
HCT: 35 % — ABNORMAL LOW (ref 36.0–46.0)
Hemoglobin: 11.9 g/dL — ABNORMAL LOW (ref 12.0–15.0)
Potassium: 4.1 mEq/L (ref 3.5–5.1)
Sodium: 140 mEq/L (ref 135–145)
TCO2: 27 mmol/L (ref 0–100)

## 2010-09-08 LAB — COMPREHENSIVE METABOLIC PANEL
Albumin: 3.1 g/dL — ABNORMAL LOW (ref 3.5–5.2)
Alkaline Phosphatase: 71 U/L (ref 39–117)
BUN: 20 mg/dL (ref 6–23)
Chloride: 105 mEq/L (ref 96–112)
Creatinine, Ser: 1.24 mg/dL — ABNORMAL HIGH (ref 0.4–1.2)
Glucose, Bld: 105 mg/dL — ABNORMAL HIGH (ref 70–99)
Total Bilirubin: 0.7 mg/dL (ref 0.3–1.2)

## 2010-09-08 LAB — CK TOTAL AND CKMB (NOT AT ARMC): CK, MB: 1.7 ng/mL (ref 0.3–4.0)

## 2010-09-08 LAB — HEPARIN LEVEL (UNFRACTIONATED)
Heparin Unfractionated: 0.23 IU/mL — ABNORMAL LOW (ref 0.30–0.70)
Heparin Unfractionated: 0.85 IU/mL — ABNORMAL HIGH (ref 0.30–0.70)

## 2010-09-08 LAB — APTT: aPTT: 150 seconds — ABNORMAL HIGH (ref 24–37)

## 2010-09-08 LAB — CBC
MCHC: 32.6 g/dL (ref 30.0–36.0)
Platelets: 230 10*3/uL (ref 150–400)
RDW: 13.1 % (ref 11.5–15.5)

## 2010-09-08 LAB — PROTIME-INR
INR: 1.11 (ref 0.00–1.49)
Prothrombin Time: 14.5 seconds (ref 11.6–15.2)

## 2010-09-08 LAB — CARDIAC PANEL(CRET KIN+CKTOT+MB+TROPI)
CK, MB: 1.3 ng/mL (ref 0.3–4.0)
CK, MB: 1.4 ng/mL (ref 0.3–4.0)
Total CK: 47 U/L (ref 7–177)
Troponin I: 0.08 ng/mL — ABNORMAL HIGH (ref 0.00–0.06)

## 2010-09-09 ENCOUNTER — Other Ambulatory Visit: Payer: Self-pay

## 2010-09-09 ENCOUNTER — Encounter (HOSPITAL_COMMUNITY): Payer: Medicare Other

## 2010-09-10 LAB — BASIC METABOLIC PANEL
BUN: 29 mg/dL — ABNORMAL HIGH (ref 6–23)
BUN: 34 mg/dL — ABNORMAL HIGH (ref 6–23)
CO2: 28 mEq/L (ref 19–32)
CO2: 29 mEq/L (ref 19–32)
Chloride: 106 mEq/L (ref 96–112)
Creatinine, Ser: 1.18 mg/dL (ref 0.4–1.2)
GFR calc Af Amer: 53 mL/min — ABNORMAL LOW (ref >60)
Glucose, Bld: 161 mg/dL — ABNORMAL HIGH (ref 70–99)
Glucose, Bld: 180 mg/dL — ABNORMAL HIGH (ref 70–99)
Potassium: 4.2 mEq/L (ref 3.5–5.1)
Sodium: 134 mEq/L — ABNORMAL LOW (ref 135–145)

## 2010-09-10 LAB — GLUCOSE, CAPILLARY
Glucose-Capillary: 128 mg/dL — ABNORMAL HIGH (ref 70–99)
Glucose-Capillary: 149 mg/dL — ABNORMAL HIGH (ref 70–99)
Glucose-Capillary: 172 mg/dL — ABNORMAL HIGH (ref 70–99)

## 2010-09-10 LAB — CBC
HCT: 37.9 % (ref 36.0–46.0)
Hemoglobin: 12.6 g/dL (ref 12.0–15.0)
MCH: 29.1 pg (ref 26.0–34.0)
MCH: 30.4 pg (ref 26.0–34.0)
MCHC: 33.3 g/dL (ref 30.0–36.0)
MCV: 91.5 fL (ref 78.0–100.0)
Platelets: 220 10*3/uL (ref 150–400)
RBC: 3.78 MIL/uL — ABNORMAL LOW (ref 3.87–5.11)

## 2010-09-11 LAB — URINALYSIS, ROUTINE W REFLEX MICROSCOPIC
Bilirubin Urine: NEGATIVE
Ketones, ur: NEGATIVE mg/dL
Nitrite: NEGATIVE
Specific Gravity, Urine: 1.012 (ref 1.005–1.030)
Urobilinogen, UA: 0.2 mg/dL (ref 0.0–1.0)

## 2010-09-11 LAB — BASIC METABOLIC PANEL
BUN: 21 mg/dL (ref 6–23)
BUN: 23 mg/dL (ref 6–23)
BUN: 27 mg/dL — ABNORMAL HIGH (ref 6–23)
CO2: 30 mEq/L (ref 19–32)
CO2: 29 mEq/L (ref 19–32)
CO2: 32 mEq/L (ref 19–32)
CO2: 34 mEq/L — ABNORMAL HIGH (ref 19–32)
Calcium: 9.2 mg/dL (ref 8.4–10.5)
Calcium: 9.6 mg/dL (ref 8.4–10.5)
Calcium: 8.8 mg/dL (ref 8.4–10.5)
Calcium: 9.3 mg/dL (ref 8.4–10.5)
Chloride: 102 mEq/L (ref 96–112)
Chloride: 101 mEq/L (ref 96–112)
Chloride: 104 mEq/L (ref 96–112)
Creatinine, Ser: 0.97 mg/dL (ref 0.4–1.2)
Creatinine, Ser: 1.11 mg/dL (ref 0.4–1.2)
Creatinine, Ser: 1 mg/dL (ref 0.4–1.2)
GFR calc Af Amer: >60 mL/min (ref >60)
GFR calc Af Amer: >60 mL/min (ref >60)
GFR calc Af Amer: >60 mL/min (ref >60)
GFR calc non Af Amer: 54 mL/min — ABNORMAL LOW (ref >60)
GFR calc non Af Amer: 43 mL/min — ABNORMAL LOW (ref >60)
GFR calc non Af Amer: 53 mL/min — ABNORMAL LOW (ref >60)
Glucose, Bld: 134 mg/dL — ABNORMAL HIGH (ref 70–99)
Glucose, Bld: 172 mg/dL — ABNORMAL HIGH (ref 70–99)
Glucose, Bld: 174 mg/dL — ABNORMAL HIGH (ref 70–99)
Glucose, Bld: 117 mg/dL — ABNORMAL HIGH (ref 70–99)
Potassium: 4 mEq/L (ref 3.5–5.1)
Potassium: 4.1 mEq/L (ref 3.5–5.1)
Potassium: 4.6 mEq/L (ref 3.5–5.1)
Potassium: 3.8 mEq/L (ref 3.5–5.1)
Sodium: 139 mEq/L (ref 135–145)
Sodium: 142 mEq/L (ref 135–145)
Sodium: 145 mEq/L (ref 135–145)

## 2010-09-11 LAB — CBC
HCT: 37.6 % (ref 36.0–46.0)
HCT: 35.8 % — ABNORMAL LOW (ref 36.0–46.0)
HCT: 36.1 % (ref 36.0–46.0)
Hemoglobin: 12.6 g/dL (ref 12.0–15.0)
Hemoglobin: 12.7 g/dL (ref 12.0–15.0)
Hemoglobin: 11.9 g/dL — ABNORMAL LOW (ref 12.0–15.0)
Hemoglobin: 11.9 g/dL — ABNORMAL LOW (ref 12.0–15.0)
Hemoglobin: 12.9 g/dL (ref 12.0–15.0)
MCH: 30.9 pg (ref 26.0–34.0)
MCH: 30 pg (ref 26.0–34.0)
MCHC: 33.7 g/dL (ref 30.0–36.0)
MCHC: 33.2 g/dL (ref 30.0–36.0)
MCHC: 33.5 g/dL (ref 30.0–36.0)
MCHC: 34.1 g/dL (ref 30.0–36.0)
MCHC: 34.5 g/dL (ref 30.0–36.0)
MCV: 91.7 fL (ref 78.0–100.0)
MCV: 89.8 fL (ref 78.0–100.0)
MCV: 91.1 fL (ref 78.0–100.0)
Platelets: 234 10*3/uL (ref 150–400)
Platelets: 249 10*3/uL (ref 150–400)
Platelets: 244 10*3/uL (ref 150–400)
RBC: 4.09 MIL/uL (ref 3.87–5.11)
RBC: 4.09 MIL/uL (ref 3.87–5.11)
RBC: 3.93 MIL/uL (ref 3.87–5.11)
RBC: 3.93 MIL/uL (ref 3.87–5.11)
RBC: 4.23 MIL/uL (ref 3.87–5.11)
RBC: 3.86 MIL/uL — ABNORMAL LOW (ref 3.87–5.11)
RDW: 13.6 % (ref 11.5–15.5)
RDW: 14 % (ref 11.5–15.5)
RDW: 11.9 % (ref 11.5–15.5)
WBC: 6.7 10*3/uL (ref 4.0–10.5)
WBC: 6.9 10*3/uL (ref 4.0–10.5)
WBC: 7.5 10*3/uL (ref 4.0–10.5)
WBC: 6.9 10*3/uL (ref 4.0–10.5)
WBC: 7.3 10*3/uL (ref 4.0–10.5)

## 2010-09-11 LAB — CARDIAC PANEL(CRET KIN+CKTOT+MB+TROPI)
CK, MB: 1 ng/mL (ref 0.3–4.0)
CK, MB: 1.2 ng/mL (ref 0.3–4.0)
CK, MB: 1.7 ng/mL (ref 0.3–4.0)
Relative Index: INVALID (ref 0.0–2.5)
Relative Index: INVALID (ref 0.0–2.5)
Relative Index: INVALID (ref 0.0–2.5)
Total CK: 44 U/L (ref 7–177)
Total CK: 51 U/L (ref 7–177)
Troponin I: 0.07 ng/mL — ABNORMAL HIGH (ref 0.00–0.06)
Troponin I: 0.12 ng/mL — ABNORMAL HIGH (ref 0.00–0.06)

## 2010-09-11 LAB — POCT CARDIAC MARKERS
CKMB, poc: <1 ng/mL — ABNORMAL LOW (ref 1.0–8.0)
CKMB, poc: <1 ng/mL — ABNORMAL LOW (ref 1.0–8.0)
Myoglobin, poc: 107 ng/mL (ref 12–200)
Myoglobin, poc: 71.7 ng/mL (ref 12–200)
Troponin i, poc: <0.05 ng/mL (ref 0.00–0.09)
Troponin i, poc: <0.05 ng/mL (ref 0.00–0.09)

## 2010-09-11 LAB — HEPARIN LEVEL (UNFRACTIONATED)
Heparin Unfractionated: 0.19 IU/mL — ABNORMAL LOW (ref 0.30–0.70)
Heparin Unfractionated: 0.72 IU/mL — ABNORMAL HIGH (ref 0.30–0.70)
Heparin Unfractionated: 0.36 IU/mL (ref 0.30–0.70)
Heparin Unfractionated: 0.46 IU/mL (ref 0.30–0.70)

## 2010-09-11 LAB — MAGNESIUM
Magnesium: 2.9 mg/dL — ABNORMAL HIGH (ref 1.5–2.5)
Magnesium: 2.6 mg/dL — ABNORMAL HIGH (ref 1.5–2.5)

## 2010-09-11 LAB — GLUCOSE, CAPILLARY
Glucose-Capillary: 194 mg/dL — ABNORMAL HIGH (ref 70–99)
Glucose-Capillary: 243 mg/dL — ABNORMAL HIGH (ref 70–99)
Glucose-Capillary: 154 mg/dL — ABNORMAL HIGH (ref 70–99)
Glucose-Capillary: 154 mg/dL — ABNORMAL HIGH (ref 70–99)
Glucose-Capillary: 160 mg/dL — ABNORMAL HIGH (ref 70–99)
Glucose-Capillary: 170 mg/dL — ABNORMAL HIGH (ref 70–99)
Glucose-Capillary: 186 mg/dL — ABNORMAL HIGH (ref 70–99)
Glucose-Capillary: 204 mg/dL — ABNORMAL HIGH (ref 70–99)

## 2010-09-11 LAB — LIPID PANEL
Cholesterol: 238 mg/dL — ABNORMAL HIGH (ref 0–200)
LDL Cholesterol: 143 mg/dL — ABNORMAL HIGH (ref 0–99)
Total CHOL/HDL Ratio: 2.9 RATIO
Triglycerides: 67 mg/dL (ref <150)
VLDL: 13 mg/dL (ref 0–40)

## 2010-09-11 LAB — HEPATIC FUNCTION PANEL
ALT: 16 U/L (ref 0–35)
ALT: 9 U/L (ref 0–35)
AST: 19 U/L (ref 0–37)
AST: 17 U/L (ref 0–37)
Albumin: 3.7 g/dL (ref 3.5–5.2)
Alkaline Phosphatase: 71 U/L (ref 39–117)
Total Protein: 6.6 g/dL (ref 6.0–8.3)
Total Protein: 7 g/dL (ref 6.0–8.3)

## 2010-09-11 LAB — DIFFERENTIAL
Basophils Absolute: 0.1 10*3/uL (ref 0.0–0.1)
Basophils Relative: 1 % (ref 0–1)
Eosinophils Absolute: 0.2 10*3/uL (ref 0.0–0.7)
Lymphocytes Relative: 23 % (ref 12–46)
Lymphs Abs: 1.7 10*3/uL (ref 0.7–4.0)
Neutro Abs: 4.3 10*3/uL (ref 1.7–7.7)
Neutro Abs: 4.8 10*3/uL (ref 1.7–7.7)
Neutrophils Relative %: 64 % (ref 43–77)
Neutrophils Relative %: 66 % (ref 43–77)

## 2010-09-11 LAB — PROTIME-INR
INR: 0.95 (ref 0.00–1.49)
Prothrombin Time: 12.6 seconds (ref 11.6–15.2)

## 2010-09-11 LAB — POCT B-TYPE NATRIURETIC PEPTIDE (BNP): B Natriuretic Peptide, POC: 93.6 pg/mL (ref 0–100)

## 2010-09-11 LAB — TSH: TSH: 2.925 u[IU]/mL (ref 0.350–4.500)

## 2010-09-11 LAB — BRAIN NATRIURETIC PEPTIDE: Pro B Natriuretic peptide (BNP): 96 pg/mL (ref 0.0–100.0)

## 2010-09-14 ENCOUNTER — Encounter (HOSPITAL_COMMUNITY): Payer: Medicare Other

## 2010-09-16 ENCOUNTER — Encounter (HOSPITAL_COMMUNITY): Payer: Medicare Other

## 2010-09-21 ENCOUNTER — Encounter (HOSPITAL_COMMUNITY): Payer: Medicare Other

## 2010-09-23 ENCOUNTER — Encounter (HOSPITAL_COMMUNITY): Payer: Medicare Other

## 2010-09-24 ENCOUNTER — Other Ambulatory Visit: Payer: Self-pay | Admitting: *Deleted

## 2010-09-24 MED ORDER — ALPRAZOLAM 0.5 MG PO TABS: ORAL_TABLET | ORAL | Status: DC

## 2010-09-27 LAB — TSH: TSH: 1.992 u[IU]/mL (ref 0.350–4.500)

## 2010-09-27 LAB — DIFFERENTIAL
Eosinophils Absolute: 0.2 10*3/uL (ref 0.0–0.7)
Eosinophils Relative: 2 % (ref 0–5)
Lymphocytes Relative: 23 % (ref 12–46)
Lymphs Abs: 1.8 10*3/uL (ref 0.7–4.0)
Monocytes Relative: 7 % (ref 3–12)

## 2010-09-27 LAB — POCT CARDIAC MARKERS
CKMB, poc: 1.7 ng/mL (ref 1.0–8.0)
Troponin i, poc: <0.05 ng/mL (ref 0.00–0.09)

## 2010-09-27 LAB — DIGOXIN LEVEL: Digoxin Level: <0.2 ng/mL — ABNORMAL LOW (ref 0.8–2.0)

## 2010-09-27 LAB — BASIC METABOLIC PANEL
BUN: 10 mg/dL (ref 6–23)
BUN: 10 mg/dL (ref 6–23)
Calcium: 8.7 mg/dL (ref 8.4–10.5)
Creatinine, Ser: 0.76 mg/dL (ref 0.4–1.2)
Creatinine, Ser: 0.97 mg/dL (ref 0.4–1.2)
GFR calc non Af Amer: 55 mL/min — ABNORMAL LOW (ref >60)
GFR calc non Af Amer: >60 mL/min (ref >60)
Glucose, Bld: 173 mg/dL — ABNORMAL HIGH (ref 70–99)

## 2010-09-27 LAB — CK TOTAL AND CKMB (NOT AT ARMC)
CK, MB: 1.6 ng/mL (ref 0.3–4.0)
Relative Index: INVALID (ref 0.0–2.5)

## 2010-09-27 LAB — GLUCOSE, CAPILLARY

## 2010-09-27 LAB — BRAIN NATRIURETIC PEPTIDE
Pro B Natriuretic peptide (BNP): 150 pg/mL — ABNORMAL HIGH (ref 0.0–100.0)
Pro B Natriuretic peptide (BNP): 181 pg/mL — ABNORMAL HIGH (ref 0.0–100.0)

## 2010-09-27 LAB — COMPREHENSIVE METABOLIC PANEL
Albumin: 3.3 g/dL — ABNORMAL LOW (ref 3.5–5.2)
BUN: 8 mg/dL (ref 6–23)
CO2: 31 mEq/L (ref 19–32)
Calcium: 8.8 mg/dL (ref 8.4–10.5)
Chloride: 104 mEq/L (ref 96–112)
Creatinine, Ser: 0.95 mg/dL (ref 0.4–1.2)
GFR calc non Af Amer: 57 mL/min — ABNORMAL LOW (ref >60)
Total Bilirubin: 0.7 mg/dL (ref 0.3–1.2)

## 2010-09-27 LAB — CARDIAC PANEL(CRET KIN+CKTOT+MB+TROPI)
Relative Index: INVALID (ref 0.0–2.5)
Troponin I: 0.11 ng/mL — ABNORMAL HIGH (ref 0.00–0.06)

## 2010-09-27 LAB — TROPONIN I: Troponin I: 0.11 ng/mL — ABNORMAL HIGH (ref 0.00–0.06)

## 2010-09-27 LAB — CBC
HCT: 37.2 % (ref 36.0–46.0)
Platelets: 245 10*3/uL (ref 150–400)
Platelets: 253 10*3/uL (ref 150–400)
RDW: 13.9 % (ref 11.5–15.5)
WBC: 7.8 10*3/uL (ref 4.0–10.5)

## 2010-09-27 LAB — URINALYSIS, MICROSCOPIC ONLY
Leukocytes, UA: NEGATIVE
Nitrite: NEGATIVE
Specific Gravity, Urine: 1.014 (ref 1.005–1.030)
pH: 6 (ref 5.0–8.0)

## 2010-09-27 LAB — LIPID PANEL
LDL Cholesterol: 103 mg/dL — ABNORMAL HIGH (ref 0–99)
Triglycerides: 223 mg/dL — ABNORMAL HIGH (ref <150)

## 2010-09-27 LAB — HEPARIN LEVEL (UNFRACTIONATED): Heparin Unfractionated: 0.26 IU/mL — ABNORMAL LOW (ref 0.30–0.70)

## 2010-09-27 LAB — HEMOGLOBIN A1C: Mean Plasma Glucose: 177 mg/dL

## 2010-09-28 ENCOUNTER — Encounter (HOSPITAL_COMMUNITY): Payer: Medicare Other | Attending: Pulmonary Disease

## 2010-09-28 ENCOUNTER — Other Ambulatory Visit: Payer: Self-pay | Admitting: *Deleted

## 2010-09-28 DIAGNOSIS — E119 Type 2 diabetes mellitus without complications: Secondary | ICD-10-CM | POA: Insufficient documentation

## 2010-09-28 DIAGNOSIS — J449 Chronic obstructive pulmonary disease, unspecified: Secondary | ICD-10-CM | POA: Insufficient documentation

## 2010-09-28 DIAGNOSIS — I2789 Other specified pulmonary heart diseases: Secondary | ICD-10-CM | POA: Insufficient documentation

## 2010-09-28 DIAGNOSIS — G4710 Hypersomnia, unspecified: Secondary | ICD-10-CM | POA: Insufficient documentation

## 2010-09-28 DIAGNOSIS — J9610 Chronic respiratory failure, unspecified whether with hypoxia or hypercapnia: Secondary | ICD-10-CM | POA: Insufficient documentation

## 2010-09-28 DIAGNOSIS — I4891 Unspecified atrial fibrillation: Secondary | ICD-10-CM | POA: Insufficient documentation

## 2010-09-28 DIAGNOSIS — Z5189 Encounter for other specified aftercare: Secondary | ICD-10-CM | POA: Insufficient documentation

## 2010-09-28 DIAGNOSIS — J4489 Other specified chronic obstructive pulmonary disease: Secondary | ICD-10-CM | POA: Insufficient documentation

## 2010-09-28 DIAGNOSIS — I519 Heart disease, unspecified: Secondary | ICD-10-CM | POA: Insufficient documentation

## 2010-09-28 DIAGNOSIS — I10 Essential (primary) hypertension: Secondary | ICD-10-CM | POA: Insufficient documentation

## 2010-09-28 DIAGNOSIS — Z87891 Personal history of nicotine dependence: Secondary | ICD-10-CM | POA: Insufficient documentation

## 2010-09-28 DIAGNOSIS — E785 Hyperlipidemia, unspecified: Secondary | ICD-10-CM | POA: Insufficient documentation

## 2010-09-28 MED ORDER — ALPRAZOLAM 0.5 MG PO TABS: ORAL_TABLET | ORAL | Status: DC

## 2010-09-28 NOTE — Telephone Encounter (Signed)
Please clarify with pharmacy.  This should have been send in recently.  Thanks.

## 2010-09-28 NOTE — Telephone Encounter (Signed)
Medication phoned to pharmacy.  

## 2010-09-30 ENCOUNTER — Encounter (HOSPITAL_COMMUNITY): Payer: Medicare Other

## 2010-10-05 ENCOUNTER — Other Ambulatory Visit: Payer: Self-pay | Admitting: Pulmonary Disease

## 2010-10-05 ENCOUNTER — Encounter (HOSPITAL_COMMUNITY): Payer: Medicare Other

## 2010-10-07 ENCOUNTER — Encounter (HOSPITAL_COMMUNITY): Payer: Medicare Other

## 2010-10-07 LAB — GLUCOSE, CAPILLARY
Glucose-Capillary: 104 mg/dL — ABNORMAL HIGH (ref 70–99)
Glucose-Capillary: 88 mg/dL (ref 70–99)

## 2010-10-11 ENCOUNTER — Encounter: Payer: Self-pay | Admitting: Family Medicine

## 2010-10-11 ENCOUNTER — Other Ambulatory Visit: Payer: Self-pay | Admitting: *Deleted

## 2010-10-11 ENCOUNTER — Ambulatory Visit (INDEPENDENT_AMBULATORY_CARE_PROVIDER_SITE_OTHER): Payer: Medicare Other | Admitting: Family Medicine

## 2010-10-11 VITALS — BP 158/102 | HR 76 | Temp 97.9°F | Wt 168.1 lb

## 2010-10-11 DIAGNOSIS — R5383 Other fatigue: Secondary | ICD-10-CM | POA: Insufficient documentation

## 2010-10-11 DIAGNOSIS — E119 Type 2 diabetes mellitus without complications: Secondary | ICD-10-CM

## 2010-10-11 DIAGNOSIS — R2681 Unsteadiness on feet: Secondary | ICD-10-CM

## 2010-10-11 DIAGNOSIS — E785 Hyperlipidemia, unspecified: Secondary | ICD-10-CM

## 2010-10-11 DIAGNOSIS — R269 Unspecified abnormalities of gait and mobility: Secondary | ICD-10-CM

## 2010-10-11 DIAGNOSIS — J449 Chronic obstructive pulmonary disease, unspecified: Secondary | ICD-10-CM

## 2010-10-11 LAB — LIPID PANEL
Cholesterol: 125 mg/dL (ref 0–200)
HDL: 47 mg/dL (ref >39)
Triglycerides: 120 mg/dL (ref <150)

## 2010-10-11 LAB — BASIC METABOLIC PANEL
BUN: 15 mg/dL (ref 6–23)
Creatinine, Ser: 0.94 mg/dL (ref 0.4–1.2)
GFR calc non Af Amer: 58 mL/min — ABNORMAL LOW (ref >60)
Potassium: 3.3 mEq/L — ABNORMAL LOW (ref 3.5–5.1)

## 2010-10-11 LAB — GLUCOSE, CAPILLARY
Glucose-Capillary: 45 mg/dL — ABNORMAL LOW (ref 70–99)
Glucose-Capillary: 111 mg/dL — ABNORMAL HIGH (ref 70–99)
Glucose-Capillary: 145 mg/dL — ABNORMAL HIGH (ref 70–99)
Glucose-Capillary: 74 mg/dL (ref 70–99)

## 2010-10-11 LAB — CBC
HCT: 36.2 % (ref 36.0–46.0)
HCT: 36 % (ref 36.0–46.0)
Hemoglobin: 11.7 g/dL — ABNORMAL LOW (ref 12.0–15.0)
MCHC: 32.4 g/dL (ref 30.0–36.0)
Platelets: 319 10*3/uL (ref 150–400)
Platelets: 299 10*3/uL (ref 150–400)
RDW: 14.8 % (ref 11.5–15.5)
WBC: 10 10*3/uL (ref 4.0–10.5)

## 2010-10-11 LAB — HEMOGLOBIN A1C: Hgb A1c MFr Bld: 8 % — ABNORMAL HIGH (ref 4.6–6.1)

## 2010-10-11 LAB — DIFFERENTIAL
Basophils Relative: 1 % (ref 0–1)
Lymphocytes Relative: 20 % (ref 12–46)
Lymphs Abs: 1.7 10*3/uL (ref 0.7–4.0)
Monocytes Relative: 7 % (ref 3–12)
Neutro Abs: 6.1 10*3/uL (ref 1.7–7.7)
Neutrophils Relative %: 71 % (ref 43–77)

## 2010-10-11 LAB — CARDIAC PANEL(CRET KIN+CKTOT+MB+TROPI)
CK, MB: 1.3 ng/mL (ref 0.3–4.0)
CK, MB: 1.3 ng/mL (ref 0.3–4.0)
Total CK: 45 U/L (ref 7–177)
Troponin I: 0.05 ng/mL (ref 0.00–0.06)

## 2010-10-11 LAB — COMPREHENSIVE METABOLIC PANEL
Albumin: 3.1 g/dL — ABNORMAL LOW (ref 3.5–5.2)
BUN: 16 mg/dL (ref 6–23)
Calcium: 9.3 mg/dL (ref 8.4–10.5)
Glucose, Bld: 136 mg/dL — ABNORMAL HIGH (ref 70–99)
Total Protein: 6.5 g/dL (ref 6.0–8.3)

## 2010-10-11 LAB — D-DIMER, QUANTITATIVE: D-Dimer, Quant: 0.52 ug/mL-FEU — ABNORMAL HIGH (ref 0.00–0.48)

## 2010-10-11 LAB — CK TOTAL AND CKMB (NOT AT ARMC)
CK, MB: 1.5 ng/mL (ref 0.3–4.0)
Total CK: 59 U/L (ref 7–177)

## 2010-10-11 LAB — MAGNESIUM: Magnesium: 2.3 mg/dL (ref 1.5–2.5)

## 2010-10-11 LAB — PROTIME-INR: Prothrombin Time: 13.4 seconds (ref 11.6–15.2)

## 2010-10-11 LAB — HEMOGLOBIN: Hemoglobin: 11.1 g/dL — ABNORMAL LOW (ref 12.0–15.0)

## 2010-10-11 MED ORDER — GLUCOSE BLOOD VI STRP: ORAL_STRIP | Status: DC

## 2010-10-11 NOTE — Assessment & Plan Note (Addendum)
Check HGB and TSH.  See notes on labs.  I think this is likely due to other medical problems, ie pulm status.

## 2010-10-11 NOTE — Patient Instructions (Signed)
You can get your results through our phone system.  Follow the instructions on the blue card. Don't change your meds in the meantime.  Glad to see you today.  Plan on coming back in 3 months for a OV.  Take care.

## 2010-10-11 NOTE — Assessment & Plan Note (Signed)
Samples of symbicort given

## 2010-10-11 NOTE — Assessment & Plan Note (Addendum)
rx written for wheelchair.  She is only stable for short distances on observation today and will need walker for rest and O2 transport.

## 2010-10-11 NOTE — Assessment & Plan Note (Signed)
Samples of crestor given.

## 2010-10-11 NOTE — Progress Notes (Signed)
O2 dependent.  Has started pulm rehab.  She needs to have a walker.  She has fatigue with exercise and this may prevent falls.  O2 dependent.  Needs a walker with wheels, handbrake and seat, esp since she has to use O2 tank.    DM2.  Weight has increased.  She is less active and is using insulin, both could contribute.  She feels weaker with more fatigue.    H/o anemia and due for labs.  No known blood loss reported.   She is going in the doughnut hole soon and will run out of meds.  Meds, vitals, and allergies reviewed.   ROS: See HPI.  Otherwise, noncontributory.  GEN: nad, alert and oriented, tearful discussing her pulmonary status but regains composure.  HEENT: mucous membranes moist NECK: supple w/o LA CV: rrr.  PULM: ctab, no inc wob ABD: soft, +bs EXT: 1+ edema SKIN: no acute rash

## 2010-10-11 NOTE — Assessment & Plan Note (Signed)
Check labs and notify patient.

## 2010-10-12 ENCOUNTER — Encounter (HOSPITAL_COMMUNITY): Payer: Medicare Other

## 2010-10-12 LAB — GLUCOSE, CAPILLARY
Glucose-Capillary: 103 mg/dL — ABNORMAL HIGH (ref 70–99)
Glucose-Capillary: 113 mg/dL — ABNORMAL HIGH (ref 70–99)
Glucose-Capillary: 133 mg/dL — ABNORMAL HIGH (ref 70–99)
Glucose-Capillary: 145 mg/dL — ABNORMAL HIGH (ref 70–99)
Glucose-Capillary: 152 mg/dL — ABNORMAL HIGH (ref 70–99)
Glucose-Capillary: 160 mg/dL — ABNORMAL HIGH (ref 70–99)
Glucose-Capillary: 165 mg/dL — ABNORMAL HIGH (ref 70–99)
Glucose-Capillary: 172 mg/dL — ABNORMAL HIGH (ref 70–99)
Glucose-Capillary: 203 mg/dL — ABNORMAL HIGH (ref 70–99)
Glucose-Capillary: 212 mg/dL — ABNORMAL HIGH (ref 70–99)
Glucose-Capillary: 257 mg/dL — ABNORMAL HIGH (ref 70–99)
Glucose-Capillary: 454 mg/dL — ABNORMAL HIGH (ref 70–99)

## 2010-10-12 LAB — CARDIAC PANEL(CRET KIN+CKTOT+MB+TROPI)
CK, MB: 1.3 ng/mL (ref 0.3–4.0)
CK, MB: 1.6 ng/mL (ref 0.3–4.0)
CK, MB: 1.6 ng/mL (ref 0.3–4.0)
CK, MB: 1.6 ng/mL (ref 0.3–4.0)
Relative Index: INVALID (ref 0.0–2.5)
Relative Index: INVALID (ref 0.0–2.5)
Relative Index: INVALID (ref 0.0–2.5)
Total CK: 30 U/L (ref 7–177)
Total CK: 32 U/L (ref 7–177)
Troponin I: 0.04 ng/mL (ref 0.00–0.06)
Troponin I: 0.05 ng/mL (ref 0.00–0.06)

## 2010-10-12 LAB — DIFFERENTIAL
Basophils Absolute: 0 10*3/uL (ref 0.0–0.1)
Basophils Relative: 0 % (ref 0–1)
Eosinophils Absolute: 0 10*3/uL (ref 0.0–0.7)
Eosinophils Absolute: 0.1 10*3/uL (ref 0.0–0.7)
Lymphocytes Relative: 17 % (ref 12–46)
Lymphocytes Relative: 9 % — ABNORMAL LOW (ref 12–46)
Lymphs Abs: 1 10*3/uL (ref 0.7–4.0)
Lymphs Abs: 1.7 10*3/uL (ref 0.7–4.0)
Monocytes Absolute: 0.7 10*3/uL (ref 0.1–1.0)
Monocytes Relative: 8 % (ref 3–12)
Neutro Abs: 5.3 10*3/uL (ref 1.7–7.7)
Neutro Abs: 8.7 10*3/uL — ABNORMAL HIGH (ref 1.7–7.7)
Neutrophils Relative %: 66 % (ref 43–77)
Neutrophils Relative %: 75 % (ref 43–77)
Neutrophils Relative %: 83 % — ABNORMAL HIGH (ref 43–77)

## 2010-10-12 LAB — COMPREHENSIVE METABOLIC PANEL
ALT: 31 U/L (ref 0–35)
AST: 20 U/L (ref 0–37)
Alkaline Phosphatase: 109 U/L (ref 39–117)
CO2: 29 mEq/L (ref 19–32)
Chloride: 102 mEq/L (ref 96–112)
GFR calc non Af Amer: 53 mL/min — ABNORMAL LOW (ref >60)
Glucose, Bld: 276 mg/dL — ABNORMAL HIGH (ref 70–99)
Potassium: 3.2 mEq/L — ABNORMAL LOW (ref 3.5–5.1)
Sodium: 139 mEq/L (ref 135–145)

## 2010-10-12 LAB — URINALYSIS, ROUTINE W REFLEX MICROSCOPIC
Bilirubin Urine: NEGATIVE
Glucose, UA: 500 mg/dL — AB
Ketones, ur: NEGATIVE mg/dL
Nitrite: NEGATIVE
pH: 5 (ref 5.0–8.0)

## 2010-10-12 LAB — BASIC METABOLIC PANEL
BUN: 15 mg/dL (ref 6–23)
BUN: 17 mg/dL (ref 6–23)
BUN: 19 mg/dL (ref 6–23)
CO2: 28 mEq/L (ref 19–32)
CO2: 30 mEq/L (ref 19–32)
CO2: 32 mEq/L (ref 19–32)
Calcium: 8.5 mg/dL (ref 8.4–10.5)
Calcium: 8.8 mg/dL (ref 8.4–10.5)
Calcium: 8.8 mg/dL (ref 8.4–10.5)
Chloride: 100 mEq/L (ref 96–112)
Chloride: 102 mEq/L (ref 96–112)
Chloride: 103 mEq/L (ref 96–112)
Creatinine, Ser: 0.94 mg/dL (ref 0.4–1.2)
Creatinine, Ser: 0.99 mg/dL (ref 0.4–1.2)
Creatinine, Ser: 1.02 mg/dL (ref 0.4–1.2)
GFR calc Af Amer: >60 mL/min (ref >60)
GFR calc Af Amer: >60 mL/min (ref >60)
GFR calc non Af Amer: 52 mL/min — ABNORMAL LOW (ref >60)
GFR calc non Af Amer: 54 mL/min — ABNORMAL LOW (ref >60)
Glucose, Bld: 151 mg/dL — ABNORMAL HIGH (ref 70–99)
Glucose, Bld: 265 mg/dL — ABNORMAL HIGH (ref 70–99)
Potassium: 3.1 mEq/L — ABNORMAL LOW (ref 3.5–5.1)
Potassium: 4.1 mEq/L (ref 3.5–5.1)
Sodium: 139 mEq/L (ref 135–145)
Sodium: 139 mEq/L (ref 135–145)

## 2010-10-12 LAB — CBC
HCT: 32 % — ABNORMAL LOW (ref 36.0–46.0)
HCT: 32 % — ABNORMAL LOW (ref 36.0–46.0)
Hemoglobin: 10.3 g/dL — ABNORMAL LOW (ref 12.0–15.0)
Hemoglobin: 10.8 g/dL — ABNORMAL LOW (ref 12.0–15.0)
Hemoglobin: 11.2 g/dL — ABNORMAL LOW (ref 12.0–15.0)
MCHC: 33.3 g/dL (ref 30.0–36.0)
MCHC: 33.7 g/dL (ref 30.0–36.0)
MCHC: 33.8 g/dL (ref 30.0–36.0)
MCV: 88.7 fL (ref 78.0–100.0)
MCV: 89.4 fL (ref 78.0–100.0)
MCV: 89.7 fL (ref 78.0–100.0)
MCV: 90.2 fL (ref 78.0–100.0)
Platelets: 218 10*3/uL (ref 150–400)
Platelets: 267 10*3/uL (ref 150–400)
RBC: 3.34 MIL/uL — ABNORMAL LOW (ref 3.87–5.11)
RBC: 3.61 MIL/uL — ABNORMAL LOW (ref 3.87–5.11)
RBC: 3.7 MIL/uL — ABNORMAL LOW (ref 3.87–5.11)
RBC: 3.73 MIL/uL — ABNORMAL LOW (ref 3.87–5.11)
RDW: 14.4 % (ref 11.5–15.5)
RDW: 14.4 % (ref 11.5–15.5)
WBC: 10.5 10*3/uL (ref 4.0–10.5)
WBC: 10.6 10*3/uL — ABNORMAL HIGH (ref 4.0–10.5)
WBC: 13.5 10*3/uL — ABNORMAL HIGH (ref 4.0–10.5)
WBC: 9.5 10*3/uL (ref 4.0–10.5)

## 2010-10-12 LAB — BLOOD GAS, ARTERIAL
Acid-Base Excess: 6.5 mmol/L — ABNORMAL HIGH (ref 0.0–2.0)
O2 Content: 3 L/min
O2 Saturation: 87.1 %
TCO2: 32.8 mmol/L (ref 0–100)
pCO2 arterial: 54.3 mmHg — ABNORMAL HIGH (ref 35.0–45.0)

## 2010-10-12 LAB — TROPONIN I: Troponin I: 0.05 ng/mL (ref 0.00–0.06)

## 2010-10-12 LAB — APTT
aPTT: 140 seconds — ABNORMAL HIGH (ref 24–37)
aPTT: 42 seconds — ABNORMAL HIGH (ref 24–37)

## 2010-10-12 LAB — CULTURE, BLOOD (ROUTINE X 2): Culture: NO GROWTH

## 2010-10-12 LAB — URINE MICROSCOPIC-ADD ON

## 2010-10-12 LAB — HEPARIN LEVEL (UNFRACTIONATED): Heparin Unfractionated: 0.15 IU/mL — ABNORMAL LOW (ref 0.30–0.70)

## 2010-10-12 LAB — LIPID PANEL
Cholesterol: 134 mg/dL (ref 0–200)
HDL: 55 mg/dL (ref >39)
Triglycerides: 48 mg/dL (ref <150)

## 2010-10-12 LAB — TSH: TSH: 1.197 u[IU]/mL (ref 0.350–4.500)

## 2010-10-12 LAB — DIGOXIN LEVEL
Digoxin Level: 0.6 ng/mL — ABNORMAL LOW (ref 0.8–2.0)
Digoxin Level: 0.6 ng/mL — ABNORMAL LOW (ref 0.8–2.0)

## 2010-10-12 LAB — BRAIN NATRIURETIC PEPTIDE: Pro B Natriuretic peptide (BNP): 157 pg/mL — ABNORMAL HIGH (ref 0.0–100.0)

## 2010-10-12 LAB — CK TOTAL AND CKMB (NOT AT ARMC): CK, MB: 1.5 ng/mL (ref 0.3–4.0)

## 2010-10-12 LAB — LEGIONELLA ANTIGEN, URINE: Legionella Antigen, Urine: NEGATIVE

## 2010-10-12 LAB — POCT I-STAT 3, ART BLOOD GAS (G3+)
Acid-Base Excess: 3 mmol/L — ABNORMAL HIGH (ref 0.0–2.0)
pCO2 arterial: 48.9 mmHg — ABNORMAL HIGH (ref 35.0–45.0)
pO2, Arterial: 61 mmHg — ABNORMAL LOW (ref 80.0–100.0)

## 2010-10-12 LAB — URINE CULTURE

## 2010-10-12 LAB — POCT CARDIAC MARKERS: CKMB, poc: <1 ng/mL — ABNORMAL LOW (ref 1.0–8.0)

## 2010-10-12 LAB — MAGNESIUM: Magnesium: 2.3 mg/dL (ref 1.5–2.5)

## 2010-10-14 ENCOUNTER — Encounter (HOSPITAL_COMMUNITY): Payer: Medicare Other

## 2010-10-19 ENCOUNTER — Encounter (HOSPITAL_COMMUNITY): Payer: Medicare Other

## 2010-10-21 ENCOUNTER — Encounter (HOSPITAL_COMMUNITY): Payer: Medicare Other

## 2010-10-26 ENCOUNTER — Encounter (HOSPITAL_COMMUNITY): Payer: Medicare Other

## 2010-10-28 ENCOUNTER — Encounter (HOSPITAL_COMMUNITY): Payer: Medicare Other

## 2010-11-02 ENCOUNTER — Encounter (HOSPITAL_COMMUNITY): Payer: Medicare Other

## 2010-11-04 ENCOUNTER — Encounter (HOSPITAL_COMMUNITY): Payer: Medicare Other

## 2010-11-09 ENCOUNTER — Encounter (HOSPITAL_COMMUNITY): Payer: Medicare Other

## 2010-11-09 NOTE — Consult Note (Signed)
NAMENESSIE, NONG              ACCOUNT NO.:  [id number]   MEDICAL RECORD NO.:  [id number]          PATIENT TYPE:  INP   LOCATION:  2602                         FACILITY:  [facility]   PHYSICIAN:  Oley Balm. Sung Amabile, MD   DATE OF BIRTH:  Mar 02, 1928   DATE OF CONSULTATION:  08/19/2008  DATE OF DISCHARGE:                                 CONSULTATION   REQUESTING PHYSICIAN:  [name], MD   REASON FOR CONSULTATION:  Pulmonary hypertension and exertional dyspnea.   HISTORY OF PRESENT ILLNESS:  Ms. Navarrete is a 77 year old woman with a  complex past medical history as documented below.  She was recently  hospitalized for decompensated congestive heart failure, and because of  a history of known pulmonary hypertension by history of known pulmonary  hypertension by prior right-heart catheterization.  She was started  Revatio.  She was discharged to home approximately 2 weeks prior to this  current hospitalization and initially felt better, but shortly  thereafter began to develop progressive exertional dyspnea.  She has had  no chest pain, pressure, tightness, or heaviness.  No fevers, chills, or  sweats.  No cough or sputum production.  No hemoptysis.  She has had  increasing lower extremity edema, orthopnea, and paroxysmal nocturnal  dyspnea.  She was admitted on August 18, 2008, by Dr. Elsie Lincoln for  decompensated heart failure thought to be right heart failure greater  than left heart failure.   PAST MEDICAL HISTORY:  1. Coronary artery disease.  2. Mitral regurgitation.  3. Pulmonary hypertension.  4. Peripheral vascular disease.  5. Type 2 diabetes.  6. Hypertension.  7. Hyperlipidemia.   SOCIAL HISTORY:  Quit smoking approximately 15 years ago.  Prior to that  was a 1-2 pack a day smoker.  She is widowed and until recently has  lived fully independently.  More recently, she has required assistance  by family members.  Her cognitive function is fully intact.   FAMILY  HISTORY:  Noncontributory.   REVIEW OF SYSTEMS:  Notable only for the orthopnea reported by the  patient's daughter.  Otherwise, review of systems is negative except as  designated in the history of present illness.   PHYSICAL EXAMINATION:  GENERAL:  She is well developed, well nourished  and upon my initial evaluation is moderately lethargic with somewhat  inappropriate answers.  VITAL SIGNS:  She is afebrile with normal vital signs except an oxygen  saturation of 88% on 4 L by nasal cannula.  HEENT:  No acute abnormalities.  NECK:  Supple without adenopathy.  Jugular venous pulsations are not  well visualized while sitting upright in a chair.  CHEST:  Normal to percussion throughout.  Breath sounds were diffusely  diminished with bilateral basilar crackles and a few scattered wheezes.  CARDIAC:  Distant heart sounds with no murmurs.  ABDOMEN:  Soft and nontender with normal bowel sounds.  EXTREMITIES:  2+ bilateral symmetric pitting pretibial edema.  NEUROLOGIC:  Lethargic and poorly oriented, but no focal deficits.   DATA:  Cardiac catheterization from 2006 reveals diffuse nonobstructive  coronary artery disease.  Pulmonary artery pressures  were measured at  66/27 with a wedge pressure of 32 at that time.  Echocardiogram  performed on August 18, 2008, reveals changes consistent with  diastolic heart function.  Left ventricular ejection fraction is  preserved.  There were no segmental wall motion abnormalities.  EKG  reveals right bundle-branch block with left posterior fascicular block  and no definite ischemic changes.  Chest x-ray reveals cardiomegaly with  interstitial prominence predominately in the bases.  Cardiac markers  have been negative this hospitalization.   IMPRESSION:  Pulmonary hypertension - I feel certain that this is  secondary pulmonary hypertension, most likely on the basis of diastolic  heart failure (that is, pulmonary venous hypertension) and presumed   chronic obstructive pulmonary disease based on her extensive smoking  history.  I wonder whether she has chronic hypoxemia that has not been  previously detected.  It is notable that the current estimation of her  right ventricular systolic pressure is almost exactly the same as the  measured pulmonary artery systolic pressure from a couple of years ago  suggesting that she does not have a progressive process and making  chronic thromboembolic disease less likely (as she has not been on  anticoagulation).  In the setting of secondary pulmonary hypertension,  pulmonary vasodilator therapy can actually exacerbate pulmonary edema.  I do not find a strong indication to continue Revatio at this time.  I  am concerned that she might have underlying interstitial lung disease  based on exam and x-ray findings.   PLAN:  1. CT scan of the chest with high-resolution cuts.  I have ordered      this without IV contrast as she has allergy to CONTRAST DYE.  2. I have made some changes in a bronchodilator regimen.  3. I am concerned about hypercarbia accounting for her lethargy and I      have ordered an arterial blood gas.  4. Careful oxygen titration to keep her oxygen saturations between 88      and 94% - no higher, no lower.  5. Discontinue Revatio for now.  6. When her cognitive function permits, it would be appropriate to      undertake pulmonary function testing.  7. Further evaluation and management will be dictated by the results      of the above studies.      Oley Balm Sung Amabile, MD  Electronically Signed     DBS/MEDQ  D:  08/20/2008  T:  08/21/2008  Job:  [id number]   cc:   [name], M.D.

## 2010-11-09 NOTE — Discharge Summary (Signed)
[name], [name]              ACCOUNT NO.:  [id number]   MEDICAL RECORD NO.:  [id number]          PATIENT TYPE:  INP   LOCATION:  4529                         FACILITY:  [facility]   PHYSICIAN:  [name], M.D.DATE OF BIRTH:  1927/07/23   DATE OF ADMISSION:  08/18/2008  DATE OF DISCHARGE:  08/25/2008                               DISCHARGE SUMMARY   DISCHARGE DIAGNOSES:  1. Acute on chronic right heart failure.  2. Severe pulmonary hypertension.  3. Normal left ventricular function.  4. Minor coronary disease in 2006 with negative Myoview, December      2008.  5. Insulin-dependent diabetes.  6. Treated hypertension.   HOSPITAL COURSE:  The patient is a 77 year old female with a history of  minor coronary disease and recurrent heart failure.  She presented on  August 18, 2008 with increasing dyspnea.  Her last echocardiogram  showed her EF to be 55% with pulmonary hypertension.  The patient was  admitted to the emergency room.  The patient was started on heparin and  IV diuretics.  Initially, Revatio was added.  Echocardiogram confirmed  severe pulmonary hypertension, cor pulmonale with an RSV pressure  greater than 60.  Pulmonary was asked to see the patient in consultation  as well as Palliative Care.  There was a question of whether or not she  had pneumonia by CT scan and antibiotics were added.  Dr. Sung Amabile saw  the patient in consult on August 20, 2008.  He did not feel Revatio  was appropriate in this setting and this was discontinued.  He suggested  continuing course of antibiotics.  The patient was made no code blue but  this was rescinded by the family and then later reinstituted.  The  patient was transferred to 4500 and we feel she could be discharged on  August 25, 2008.  The patient is awake and alert.  She has no shortness of  breath at rest.   DISCHARGE MEDICATIONS:  She is discharged home on:  1. Glipizide 10 mg daily.  2. Insulin sliding scale.  3.  Plavix 75 mg a day.  4. Potassium 20 mEq a day.  5. Lanoxin 0.125 mg a day.  6. Benicar 40 mg a day.  7. Norvasc 10 mg a day.  8. Imdur 60 mg a day.  9. Lasix 40 mg a day.  10.Indomethacin 75 mg b.i.d. p.r.n. gout.  11.Toprol 25 mg a day.   We have stopped her Revatio, Zetia, and lisinopril.  She will also go  home on Ativan 0.5 mg one-half to one q.6 p.r.n., Ventolin inhaler 2.5  mg q.6 p.r.n., and Avelox 400 mg daily until August 30, 2008.   LABORATORY DATA:  O2 saturation on room air is 87% at rest.  Sodium 139,  potassium 4.1, BUN 15, and creatinine 0.96.  White count 13.5,  hemoglobin 10.8, hematocrit 32, and platelets 307.  CT of the chest  shows bilateral lower lobe consolidation, worrisome for pneumonia.  CT  of the head showed no acute findings and was normal for her age.  Echocardiogram revealed cor pulmonale with an elevated  RSVP at 61, mild  MR, mild AS with a mean gradient of 12, and an EF of 55%.  Magnesium was  2.3.  BNP 137.  EKG showed sinus rhythm, right bundle-branch block.   DISPOSITION:  The patient is discharged in stable condition to home.  Home Health nurse has been arranged and will be followed also by  Palliative Care.   CODE STATUS:  Her code status will be clarified at discharge, at one  point she was a DNR and then a limited code.      Abelino Derrick, P.A.    ______________________________  [name], M.D.    Lenard Lance  D:  08/25/2008  T:  08/25/2008  Job:  [id number]   cc:   Wilson Singer, M.D.  Oley Balm Sung Amabile, MD

## 2010-11-09 NOTE — Discharge Summary (Signed)
NAMEERCILIA, BETTINGER              ACCOUNT NO.:  [id number]   MEDICAL RECORD NO.:  [id number]          PATIENT TYPE:  OBV   LOCATION:  2040                         FACILITY:  [facility]   PHYSICIAN:  [name], M.D.DATE OF BIRTH:  Aug 10, 1927   DATE OF ADMISSION:  07/11/2008  DATE OF DISCHARGE:  07/13/2008                               DISCHARGE SUMMARY   DISCHARGE DIAGNOSES:  1. Right heart failure, acute on chronic.  2. Pulmonary hypertension, Revatio ordered this admission.  3. Coronary artery disease with total right coronary artery      catheterization in April 2009.  4. Type 2 insulin-dependent diabetes which has been labile.  5. Hypertension which has also been labile.  6. Preserved left ventricular function.   HOSPITAL COURSE:  The patient is a 77 year old female who was seen in  the emergency room on July 11, 2008.  She was admitted with dyspnea.  Review of her past records show she has a history of severe pulmonary  hypertension with pulmonary artery pressures of 66/27 by catheterization  in 2007.  She was last seen by Dr. Elsie Lincoln in August 2009.  She was given  a prescription for Revatio at that time, but she never filled it because  of the expense.  She was admitted from the emergency room for further  evaluation and treatment.  She was put on IV diuretics.  Revatio was  instituted.  She was improved by July 13, 2008.  Her lower extremity  edema had improved and her shortness of breath had improved.  Dr. Elsie Lincoln  felt she could be discharged.  She will follow up with Dr. Elsie Lincoln as an  outpatient.   DISCHARGE MEDICATIONS:  1. Potassium 20 mEq a day.  2. Lanoxin 0.125 mg a day,  3. Benicar 40 mg a day.  4. Norvasc 10 mg a day.  5. Lipitor 40 mg a day.  6. Imdur 60 mg a day.  7. Lasix 40 mg twice a day.  8. Indocin 75 mg twice a day p.r.n.  9. Lisinopril 40 mg twice a day.  10.Toprol 25 mg twice a day.  11.Zetia 10 mg a day.  12.Revatio 20 mg t.i.d.  13.Plavix 75 mg a day.  14.Aspirin 81 mg a day.  15.Darvocet-N 100 one to two q.6 p.r.n.  16.Glipizide 10 mg 3 times a day.  17.Humalog 70/25, 20 units b.i.d.   LABORATORY DATA:  EKG shows sinus rhythm with right bundle-branch block.  White count 10, hemoglobin 11.8, hematocrit 36, and platelets 299.  INR  1.0.  Sodium 144, potassium 3.3, BUN 15, and creatinine 0.9.  Liver  functions are normal.  CK-MB and troponins are negative.  BNP is 103.  Cholesterol is 125, HDL 47, LDL 54, and TSH 1.59.   DISPOSITION:  The patient is discharged in stable condition and will  follow up with Dr. Elsie Lincoln, she has a previously scheduled appointment  and will keep this.      Abelino Derrick, P.A.    ______________________________  [name], M.D.    Lenard Lance  D:  08/06/2008  T:  08/07/2008  Job:  [id number]   cc:   [name], M.D.

## 2010-11-09 NOTE — Consult Note (Signed)
[name], BACCHI              ACCOUNT NO.:  [id number]   MEDICAL RECORD NO.:  [id number]          PATIENT TYPE:  INP   LOCATION:  2602                         FACILITY:  [facility]   PHYSICIAN:  Wilson Singer, M.D.DATE OF BIRTH:  1928/06/16   DATE OF CONSULTATION:  08/20/2008  DATE OF DISCHARGE:                                 CONSULTATION   REFERRING PHYSICIAN:  [name], MD   REASON FOR CONSULTATION:  To assist with goals of care and symptom  recommendations.   Consultation performed by [name], nurse practitioner.    This NP reviewed the medical records, received report from the team,  assessed the patient, and met with the patient's daughter and grandson  to discuss goals of care, symptom recommendations, as well as wishes.  Greater than 50% of the consultation was spent in counseling and  coordination of care from the time of 10:50 to 12:20 p.m.   Family wishes based on the patient's advanced directives/recommendations  are as follows:  1. DNR and comfort care.  2. Transfer to the Palliative Care Unit when a bed is available.  3. Continue to treat pneumonia.  4. Comfort feeding understanding the risks of aspiration.  5. Aggressive symptom management if the patient declines consider      morphine 2-4 mg IV q.4 h. p.r.n. as well as Ativan 0.5-1 mg IV q.4      h. p.r.n. agitation.  6. Continued support to family, as the patient's prognosis is      dependent upon how she does over the next few days.  Her prognosis      will affect eligibility options within the community.  Our team      will follow up and continue to support.   IMPRESSION:  Bilateral lower lobe pneumonia with resultant failure to  thrive and debility.  The patient has a baseline palliative performance  scale of 70% but now with 30%.  Her comorbidities of pulmonary  hypertension, CAD with an ejection fraction of 50%.  Palliative Care  Unit will be appropriate based on the patient and family  goals.  Her  prognosis will be dependent upon her response to treatment and her  intake and overall condition.  Our team will assist over the next few  days with continued goals of care and eligibility and emotional support.  Please call [name], nurse practitioner, at [phone] with any  questions or concerns.   HISTORY OF PRESENT ILLNESS:  Ms. [name] is a 77 year old  Caucasian female who lives at home independently and was brought into  the hospital with her daughter on August 18, 2008, secondary to  increasing shortness of breath.  She has a past medical history included  right-sided heart failure acute on chronic as well as pulmonary  hypertension as well as coronary artery disease, type 2 diabetes,  hypertension, and functional decline over the last few year secondary to  shortness of breath on exertion.  The patient was found to have  bilateral lower lobe pneumonia on admission and has been failing to  thrive.  She has been refusing  most meals and is dependent for most  activities of daily living.  She has also been pleasantly confused and  very lethargic with desaturations occurring when she removes her oxygen.  I was asked to meet with the patient and family today to discuss overall  goals of care based on her current condition and overall voice wishes to  the family.   MEDICATIONS:  1. Ventolin 2.5 mg inhaled q.4 h. p.r.n.  2. Xanax 0.25 mg p.o. b.i.d.  3. Norvasc 10 mg p.o. daily.  4. Zithromax 500 mg p.o. daily.  5. Rocephin 1 g IV q.24 h.  6. Plavix 75 mg p.o. daily.  7. Lanoxin 0.125 mg p.o. daily.  8. Lovenox 40 mg subcu q.24 h.  9. Zetia 10 mg p.o. daily.  10.Lasix 40 mg p.o. b.i.d.  11.Glucotrol 10 mg p.o. b.i.d.  12.Insulin per pharmacy protocol, Lantus 16 units subcu nightly.  13.Prinivil 40 mg p.o. b.i.d.  14.Ativan 2 mg x1.  15.K-Dur 20 mEq p.o. daily.  16.Benicar 40 mg p.o. daily.  17.Maalox 15-30 mL p.o. q.2 h. P.r.n.  18.Morphine 2-4 mg  IV q.1 h. P.r.n.  19.Zofran 4 mg IV q.1 h. p.r.n.   ALLERGIES:  CODEINE and CONTRAST MEDIA.   FAMILY HISTORY:  Noncontributory.   SOCIAL HISTORY:  The patient lived independently.  She is widowed.  She  has a son and daughter who live locally.  She has 2 grandsons who are  also involved in her care.  She is retired.  Her daughter reports that  she can take her home upon discharge if she is able to get up to the  bathroom and back.  She had previously stated that she did not want any  artificial means of support in the phase of terminal disease and did not  want to be on the ventilator in the event that she declined.  Her  daughter is her healthcare power of attorney.  She does not smoke, drink  alcohol, or use any illicit drugs.  She is a [employer] of cath[employer]  faith.   REVIEW OF SYSTEMS:  The patient is unable to answer questions; however,  the patient's daughter at the bedside reports that she has been unable  to climb stairs or walk far distances for several years.  She reports  she becomes short of breath on minimal exertion and must rest.  They  report that she was able to drive and lived independently; however over  the last month or two, she has been declining.  They deny any nausea,  vomiting, or diarrhea.  They deny any complaints of headache or pain.  They do report that she becomes confused when she is extremely short of  breath, and here in the hospital, she has been eating less secondary to  lethargy and eating less than 10% of most meals.   PHYSICAL EXAMINATION:  GENERAL:  She is an elderly Caucasian female,  very lethargic, and unable to participate in the exam secondary to sleep  and weakness.  VITAL SIGNS:  Temp 98.5, pulse 105, respirations 26, BP 148/47, O2 sat  91% on 6 L nasal cannula.  HEENT:  Head is normocephalic.  Mucous membranes dry.  NECK:  Supple.  No lymphadenopathy, no thyromegaly, no JVD.  LUNGS:  Diminished bilaterally, especially bases.  HEART:   Heart rate is regular.  No murmurs, rubs, or gallops.  ABDOMEN:  Soft, positive bowel sounds.  EXTREMITIES:  She has trace edema bilaterally.  Purposeful movements of  all 4 extremities.  NEUROLOGIC:  The patient is alert to person and place.  She is confused  about time and is very lethargic.   LABORATORY DATA:  Sodium 139, potassium 4.1, BUN 15, creatinine 0.96,  calcium 9.0, albumin 2.9.  ABG includes PO2 of 54, PCO2 of 32.8.  Cardiac enzymes, negative.  Beta-natriuretic peptide 157.  White blood  cell count 9.5, hemoglobin 10, hematocrit 29.9, platelets 218.      Asencion Noble, NP      Wilson Singer, M.D.  Electronically Signed    KMJ/MEDQ  D:  08/20/2008  T:  08/21/2008  Job:  [id number]   cc:   [name], M.D.  [facility]

## 2010-11-11 ENCOUNTER — Encounter (HOSPITAL_COMMUNITY): Payer: Medicare Other

## 2010-11-11 ENCOUNTER — Other Ambulatory Visit: Payer: Self-pay | Admitting: *Deleted

## 2010-11-11 MED ORDER — ALPRAZOLAM 0.25 MG PO TABS: 0.2500 mg | ORAL_TABLET | Freq: Every evening | ORAL | Status: DC | PRN

## 2010-11-11 NOTE — Telephone Encounter (Signed)
Rx called to [facility] 

## 2010-11-12 NOTE — Op Note (Signed)
NAMEKASSIE, KENG              ACCOUNT NO.:  [id number]   MEDICAL RECORD NO.:  [id number]          PATIENT TYPE:  AMB   LOCATION:  DAY                          FACILITY:  [facility]   PHYSICIAN:  Ronald A. Gioffre, M.D.DATE OF BIRTH:  [birth year]   DATE OF PROCEDURE:  08/04/2005  DATE OF DISCHARGE:                                 OPERATIVE REPORT   SURGEON:  Georges Lynch. Darrelyn Hillock, M.D.   OPERATIONS:  Jamelle Rushing, P.A.   PREOP DIAGNOSIS:  1.  Complete tear rotator cuff tendon on the right preop diagnosis  2.  Severe impingement syndrome right shoulder postop diagnosis.   POSTOPERATIVE DIAGNOSIS:  1.  Complete tear rotator cuff tendon on the right preop diagnosis  2.  Severe impingement syndrome right shoulder postop diagnosis.   OPERATION:  1.  Open decompression including an acromioplasty of the right shoulder.  2.  Repair of a complete degenerative tear of the rotator cuff tendon, right      shoulder.  3.  Restore tendon graft to the rotator cuff right shoulder.   ANESTHESIA:  Note, in the holding area she also had an interscalene nerve  block.   PROCEDURE:  Under general anesthesia the patient first had 1 gram of IV  Ancef. A routine orthopedic prep and draping of the right shoulder was  carried out. A sterile prep and drape was carried out. Incision was made  over the anterior aspect of the right shoulder. Bleeders identified and  cauterized. At this time I stripped the deltoid tendon, by sharp dissection,  from the acromion. We then incised the acromioclavicular ligament. I removed  the subdeltoid bursa.   At this time we immediately noted a large degenerative tear of the rotator  cuff; actually the acromion was embedded down into the tear. We protected  the underlying cuff utilized the Bennett retractor to protect the cuff and  then utilizing the oscillating saw and the bur to even out the undersurface  of the acromion. We then irrigated the area out and bone waxed the  undersurface of the acromion. We then inserted some thrombin-soaked Gelfoam.  This Gelfoam was placed subacromial. We then repaired the rotator cuff by  primary suture and then reinforced it with a restore tendon graft. The  deltoid tendon and muscle were then reapproximated in the usual fashion. The  subcu was closed with #0 Vicryl and the skin with metal staples; and a  sterile Neosporin dressing was applied. She was placed in a shoulder  immobilizer and left the operating room in satisfactory condition.          ______________________________  Georges Lynch Darrelyn Hillock, M.D.    RAG/MEDQ  D:  08/04/2005  T:  08/05/2005  Job:  [id number]

## 2010-11-12 NOTE — Cardiovascular Report (Signed)
[name], [name]              ACCOUNT NO.:  [id number]   MEDICAL RECORD NO.:  [id number]          PATIENT TYPE:  OBV   LOCATION:  2901                         FACILITY:  [facility]   PHYSICIAN:  Darlin Priestly, MD  DATE OF BIRTH:  09/06/1927   DATE OF PROCEDURE:  10/18/2004  DATE OF DISCHARGE:                              CARDIAC CATHETERIZATION   PROCEDURES:  1.  Right heart catheterization.  2.  Left heart catheterization.  3.  Coronary angiography.  4.  Left ventriculogram.  5.  Right innominate vein angiography.  6.  Left subclavian angiography.  7.  Bilateral renal angiograms.   ATTENDING:  Darlin Priestly, MD   COMPLICATIONS:  None.   INDICATIONS:  [name] is a 77 year old female patient of Dr. Nanetta Batty, with a history of hypertension, CAD (status post PCI), noninsulin-  dependent diabetes mellitus; who was admitted on October 15, 2004 with acute  onset of shortness of breath.  She subsequently ruled out for myocardial  infarction.  She was also noted to be in atrial fibrillation, with  uncontrolled ventricular response.  She is known to have depressed EF, with  the last known EF approximately 30% with 2+ MR.  She is now brought for  repeat catheterization to reassess her coronary anatomy, as well as her LV.   DESCRIPTION OF PROCEDURE:  After informed written consent, the patient  brought to the cardiac catheterization lab.  The right and left groin were  shaved, prepped and draped in the usual sterile fashion.  Procedure monitors  were established.  Using modified Seldinger technique, a 7-French __________  sheath was inserted in the right femoral vein and a 6-French retriever  sheath inserted in the right femoral artery.   Next, under fluoroscopic guidance, a 7-French Swan-Ganz catheter was then  floated into the RA, RV, PA and wedge positions.  Hemodynamic measurements  were obtained.  A 6-French diagnostic catheter was used to perform  diagnostic  angiography.   FINDINGS:   CORONARY ANGIOGRAPHY:  1.  LEFT MAIN:  A large vessel with no significant disease.  2.  LEFT ANTERIOR DESCENDING ARTERY:  A medium-sized vessel that coursed      through axis with one diagonal branch.  There was calcification noted in      the proximal and mid LAD.  There is diffuse 40-50% mid and distal LAD      disease, but no critical stenosis.  3.  FIRST DIAGONAL:  A small vessel which bifurcates at its distal segment,      with 80% ostial lesion.  4.  LEFT CIRCUMFLEX:  A medium sized vessel that coursed to the AV groove      and gives off two obtuse marginal branches.  The circumflex is noted to      be diffusely calcified in its proximal and mid segment, with up to 30%      irregularities.  The first OM was a small vessel with diffuse disease.      The second OM was a medium-sized vessel, which bifurcates in its distal      segment.  There was diffuse 30-40% disease scattered throughout the      second OM.  There is noted to be left-to-right collaterals to the distal      RCA, PDA and posterolateral branch.  5.  RIGHT CORONARY ARTERY:  Noted to be totally occluded in its proximal      segment.  There are faint right-to-right collaterals to the distal RCA,      as well as PDA and posterolateral branch.   LEFT VENTRICULOGRAM:  Reveals dilated LV with severely depressed EF of  approximately 25-30%, with global hypokinesis.  There appears to be  posterobasilar akinesis.  There appears to be at least a moderate mitral  regurgitation.   RIGHT INNOMINATE ANGIOGRAM:  Reveals approximately 50% ostial left carotid  stenosis.   LEFT SUBCLAVIAN ANGIOGRAM:  Reveals 40% proximal calcification with a  kinking segment in the proximal portion of the left subclavian, but no high-  grade stenosis.   RENAL ARTERY ANGIOGRAM:  Left and right renal arteries reveal no evidence of  significant renal artery disease.   HEMODYNAMIC DATA:  1.  Right atrial pressure:  16.   2.  Right ventricular pressure:  64 with 15.  3.  PA:  66/27.  4.  Pulmonary capillary wedge pressure:  32.  5.  Systemic arterial pressure:  208/100.  6.  LV systolic pressure:  210/27; LVEDP 31.  7.  Cardiac Output:  3.1; Cardiac Index:  1.8.  8.  PA saturation:  56%.  9.  AO saturation:  88%.   CONCLUSION:  1.  Significant two-vessel coronary artery disease.  2.  Severely depressed left ventricular systolic function.  Wall motion      abnormalities as noted above.  3.  At least moderate mitral regurgitation.  4.  Mild peripheral vascular disease.  5.  Elevated pulmonary capillary wedge pressure, with moderate to severe      pulmonary hypertension.  6.  Systemic hypertension with elevated left ventricular end diastolic      pressure.  7.  Cardiac output 3.1, cardiac index was 1.8.  8.  PA saturation of 56%, AO saturation 88%.      RHM/MEDQ  D:  10/18/2004  T:  10/19/2004  Job:  [id number]

## 2010-11-12 NOTE — Cardiovascular Report (Signed)
[place]  Patient:    [name], [name]                     MRN: [id number] Proc. Date: 06/30/00 Adm. Date:  [id number] Attending:  Berry, Jonathan [place] CC:         Cardiac Catheterization Laboratory  Rudi Heap, M.D. - [place], [place] [address] [place], [place] [address]   Cardiac Catheterization  PROCEDURE:  Cardiac catheterization.  CARDIOLOGIST:  Runell Gess, M.D.  INDICATIONS:  [name] is a 77 year old, mildly-overweight white female, with a history of hypertension, remote tobacco abuse, right bundle branch block, and ischemic heart disease.  She had a myocardial infarction in 1992.  She had a PTCA of her RCA.  She was recatheterized in April 1994, and found to have noncritical CAD, with mild inferior hypokinesia and an ejection fraction of 55%.  A Cardiolyte stress test performed in January 1995, revealed mild inferobasal redistribution.  She has been complaining of progressive hypertension, dyspnea, and presyncope.  A Cardiolyte stress test performed on June 22, 2000, showed a transmural inferior wall scar with mild peri-infarction ischemia.  Because of the progressive symptoms worrisome for ischemia, the patient presents now for diagnostic cardiac catheterization.  DESCRIPTION OF PROCEDURE:  The patient was brought to the second floor [facility] Cardiac Catheterization in the postabsorptive state. She was premedicated with IV Benadryl, Solu-Medrol, and Pepcid for contrast dye allergy prophylaxis, as well as p.o. Valium.  Her right groin was prepped and shaved in the usual sterile fashion.  Xylocaine 1% was used for local anesthesia.  A 6-French sheath was inserted into the right femoral artery using the standard Seldinger technique.  The 6-French right and left Judkins diagnostic catheters along with a 6-French pigtail catheter were used  for selective coronary angiography, left ventriculography, selective right and left subclavian artery angiography, and a distal abdominal aortography. Omnipaque dye was used for the entirety of the case.  Retrograde aortic, left ventricular, and pullback pressures were recorded.  HEMODYNAMICS: Aortic systolic pressure:  214. Diastolic pressure:  103. Left ventricular systolic pressure:  214. End diastolic pressure:  30.  SELECTIVE CORONARY ANGIOGRAPHY: 1. Left main coronary artery:  The left main coronary artery is normal. 2. Left anterior descending coronary artery:  The left anterior descending    coronary artery is normal. 3. Left circumflex coronary artery:  The left circumflex coronary artery    had a approximately 30%-40% hypodense proximal/midstenosis. 4. Right coronary artery:  The right coronary artery was totally occluded    proximally and filled with grade 1 collaterals right to right.  There    are no left to right collaterals noted.  LEFT VENTRICULOGRAPHY:  The RAO left ventriculogram is performed using 25 cc of Omnipaque dye at 12 cc per second.  Therefore the LVEF is estimated visually at 30%, with mild to moderate anteroapical and severe inferobasal hypokinesia.  There is 2+ angiographic mitral regurgitation noted.  DISTAL ABDOMINAL AORTOGRAPHY:  The distal abdominal aortogram was performed using 20 cc of Omnipaque dye at 20 cc per second.  The renal arteries were widely patent.  The infrarenal abdominal aorta tapered to approximately 40% stenosis, and there was a 60% proximal left common iliac artery stenosis.  IMPRESSION:  [name] has what appears to be a chronically-occluded right coronary artery with moderate left ventricular dysfunction and mitral regurgitation, which I suspect is functional and not  catheter-related.  She did become significantly hypoxemic and short of breath at the termination of the procedure.  She was given sublingual and intravenous  nitroglycerin because of significant hypertension, as well as 80 mg of intravenous Lasix, and 20 mg of intravenous labetalol.  A Foley catheter was inserted.  Her saturations dropped from approximately 90 to 79, and then gradually with diuresis and mild sedation returned back to near baseline.  I suspect that this was contrast/volume-related.  PLAN:  For medical therapy with diuretic, nitrate, ACE inhibitor, digoxin, and a beta blocker.  The patient did receive 2000 of heparin during the procedure, and the sheaths were sewn in place.  She left the cardiac catheterization laboratory in stable condition to an intensive care unit bed.  Her sheaths will be removed, once she clinically stabilizes.  She probably will be discharged in the next one to two days, after her medications are initiated and titrated. DD:  06/30/00 TD:  06/30/00 Job: [id number]

## 2010-11-12 NOTE — Discharge Summary (Signed)
[place]. [facility]  Patient:    [name], [name]                     MRN: [id number] Adm. Date:  [id number] Disc. Date: [id number] Attending:  Berry, Jonathan [place] Dictator:   [facility] [place] CC:         Monica Becton, M.D.                           Discharge Summary  ADMISSION DIAGNOSES: 1. Unstable angina. 2. Allergy to intravenous pyelogram dye. 3. Obesity. 4. Hypertension. 5. Remote tobacco use. 6. Chronic right bundle branch block. 7. Non-insulin-dependent diabetes mellitus. 8. History of coronary artery disease.    a. Status post myocardial infarction March 1992, treated with percutaneous       transluminal coronary angioplasty.    b. Status post recatheterization October 13, 1992 with noncritical coronary       artery disease, ejection fraction 55%.    c. Status post Cardiolite January 1995.    d. Status post Cardiolite June 22, 2000 with transmural inferior wall       scar and mild peri-infarct ischemia.    e. Status post echocardiogram that showed mild inferior hypokinesis and       moderate to severe left atrial enlargement. 9. Status post carotid Doppler showing mild right internal carotid artery    stenosis.  DISCHARGE DIAGNOSES:  1. Unstable angina.  2. Allergy to intravenous pyelogram dye.  3. Obesity.  4. Hypertension.  5. Remote tobacco use.  6. Chronic right bundle branch block.  7. Non-insulin-dependent diabetes mellitus.  8. History of coronary artery disease.     a. Status post myocardial infarction March 1992, treated with percutaneous        transluminal coronary angioplasty.     b. Status post recatheterization October 13, 1992 with noncritical coronary        artery disease, ejection fraction 55%.     c. Status post Cardiolite January 1995.     d. Status post Cardiolite June 22, 2000 with transmural inferior wall        scar and mild peri-infarct ischemia.     e. Status post echocardiogram that  showed mild inferior hypokinesis and        moderate to severe left atrial enlargement.  9. Status post carotid Doppler showing mild right internal carotid artery     stenosis. 10. Status post cardiac catheterization June 30, 2000 by Dr. Nanetta Batty     with a total right coronary artery occlusion that had right to right     collateral flow. She was also found to decreased left ventricular function     with ejection fraction 30%, 2+ mitral regurgitation.  It was felt her     symptoms were secondary to increased filling pressures and left     ventricular dysfunction.  Medical therapy was recommended with ACE     inhibitor, Digoxin, diuretic and nitrate. 11. Status post intermittent bradycardia through the hospitalization. 12. Status post hypotension, periodically ,intermittently throughout the     hospitalization. 13. Status post desaturation at the time of catheterization requiring     intravenous nitroglycerin, Lasix and labetalol. 14. Peripheral vascular disease.  HISTORY OF PRESENT ILLNESS:  [name] is a 77 year old moderately overweight white female with history of hypertension, remote tobacco abuse, chronic right bundle branch block and ischemic heart disease.  She also has  non-insulin requiring diabetes.  She is status post inferior wall myocardial infarction March 1992 treated with PTCA.  She was recathed October 13, 1992 and found to have noncritical CAD and mild inferior hypokinesis with EF 50%.  Cardiolite stress test performed January 1995 revealed mild inferior basal redistribution.  Dr. Allyson Sabal saw her on June 14, 2000 at which time she was complaining of hypotensive dizziness and presyncope and back pain which mimicked her ischemic symptoms, as well as progressive dyspnea over the last two months.  Cardiolite stress test performed June 22, 2000 showed transmural inferior wall scar with mild peri-infarct ischemia.  A 2-D echo showed a mild inferior hypokinesis  and moderate to severe left atrial enlargement measuring 5.2 cm.  Carotid Dopplers revealed mild right ICA stenosis.  Because of progressive symptoms of ischemia, despite an essentially unremarkable Cardiolite, the patient was then admitted for diagnostic coronary arteriography.  She would be treated with IV Benadryl, Pepcid, and Solu-Medrol for contrast allergy prophylaxis.  The patient was agreeable with the approach.  HOSPITAL COURSE:  On June 30, 2000 Ms. Feagan underwent cardiac catheterization with Dr. Nanetta Batty.  Coronary angiography revealed: 1. Left main normal. 2. LAD normal. 3. Left circumflex had an approximately 30-40% hypodense proximal/mid    stenosis. 4. RCA was proximally occluded and filled with grade I collaterals right to    right.  No left to right collaterals noted. 5. Left ventriculography showed EF 30% with mild to moderate anterior apical    and severe inferior basal hypokinesis.  There was 2+ angiographic mitral    regurgitation. 6. Distal abdominal aortography:  Renal arteries were widely patent.    Infrarenal abdominal aorta tapered to approximately 40% stenosis and there    was a 60% proximal left common iliac artery stenosis.  She was planned for medical therapy with diuretics, nitrates, ACE inhibitor, Digoxin and beta blocker.  On July 01, 2000 Ms. Beagley had no complaints and no shortness of breath. Her cath site was okay.  She experienced fluid overload at the time of her cardiac catheterization.  She had been treated with IV Lasix which was then changed to p.o. on July 01, 2000.  On July 02, 2000, Ms. Knisely has experienced some bradycardia and hypotension from her medications.  During the previous day her blood pressure had dropped to 84/44 and her heart rate dropped to 48.  Therefore, her Toprol and Altace were decreased and Lasix was changed to p.o. q.d. dosing.  On July 03, 2000, Ms. Belizaire had no complaints and was feeling great.  She was afebrile at 97.1, pulse 60, blood pressure 145/74, oxygen saturation 90% on  room air.  Her laboratories were stable.  Her lungs were clear.  Heart was regular rhythm and her groin was stable with no hematoma or bleed.  Her heart rate and blood pressure remained stable and she was felt stable for discharge home.  HOSPITAL CONSULTS:  None.  HOSPITAL PROCEDURES:  Cardiac catheterization June 30, 2000 by Dr. Nanetta Batty.  This revealed: 1. Left main normal. 2. LAD normal. 3. Left circumflex approximate 30-40% hypodense proximal/mid stenosis. 4. RCA was proximally occluded and filled with grade I collaterals right to    right.  No left to right collaterals. 5. Left ventriculography showed EF 30% with mild to moderate anterior apical    and severe inferior basal hypokinesis.  There was 2+ mitral regurgitation. 6. Distal abdominal aortography showed renal arteries were widely patent.    Infrarenal abdominal aorta tapered to  approximately 40% stenosis and an    approximate 60% proximal left common iliac artery stenosis.  She was planned for medical therapy with diuretics, nitrates, ACE inhibitor, Digoxin and beta blocker.  LABORATORY DATA:  Urinalysis negative.  On June 30, 2000 PT 12.5, INR 1.0, PTT 32.  CBC on June 30, 2000 showed WBC 7.4, hemoglobin 12.3, hematocrit 37.5, platelets 292.  On July 01, 2000 WBC 12.5, hemoglobin 118, hematocrit 35.2, platelets 289.  On June 30, 2000 sodium 137, potassium 2.9, glucose 199, BUN 13, creatinine 0.5.  On July 03, 2000 sodium 141, potassium 3.8, glucose 141, BUN 23, creatinine 0.6. Urinalysis negative.  RADIOGRAPHIC DATA:  Chest x-ray June 30, 2000 showed stable cardiac enlargement and stable chronic interstitial changes but no acute pulmonary findings.  EKG shows normal sinus rhythm, right bundle branch block which was chronic, nonspecific ST-T changes.  DISCHARGE MEDICATIONS:  1. Tranxene 3.75 mg as needed.  2.  Digoxin 0.125 mg 1 p.o. q.d.  3. Altace 5 mg p.o. b.i.d.  4. Norvasc 10 mg 1 p.o. q.d.  5. Toprol XL 50 mg 1 p.o. q.d.  6. Imdur 60 mg 1 p.o. q.d.  7. Amaryl 2 mg 1 p.o. q.d.  8. Lipitor 10 mg 1 p.o. q.d.  9. Aspirin 81 mg 1 p.o. q.d. 10. Lasix 40 mg 1 p.o. b.i.d. 11. Stop taking nifedipine. 12. Stop taking nadolol.  ACTIVITY:  No strenuous activity, lifting greater than five pounds or driving for three days.  DIET:  Low salt, low fat diabetic diet, low cholesterol.  WOUND CARE:  MAy generally wash her groin with warm water and soap.  DISCHARGE INSTRUCTIONS:  Call the office at [phone] if any questions or problems.  FOLLOW-UP PLANS:  Follow up with Dr. Allyson Sabal July 19, 2000 at 11:15 a.m. Call Dr. Theron Arista at [phone] to follow up with him. DD:  07/17/00 TD:  07/18/00 Job: [id number]

## 2010-11-12 NOTE — Discharge Summary (Signed)
[name], [name]              ACCOUNT NO.:  [id number]   MEDICAL RECORD NO.:  [id number]          PATIENT TYPE:  OBV   LOCATION:  4728                         FACILITY:  [facility]   PHYSICIAN:  Raymon Mutton, P.A. DATE OF BIRTH:  03/18/1928   DATE OF ADMISSION:  10/15/2004  DATE OF DISCHARGE:  10/19/2004                                 DISCHARGE SUMMARY   DISCHARGE DIAGNOSIS:  1.  Congestive heart failure exacerbation.  2.  Worsening of ischemic cardiomyopathy.  3.  Coronary artery disease status post catheterization during this      admission, no intervention.  4.  Transient atrial fibrillation/atrial flutter with pauses - resolved.  5.  Hypertension.  6.  Adult onset diabetes mellitus.  7.  Status post acute respiratory failure on admission - resolved.   DISCHARGE MEDICATIONS:  Toprol XL 25 mg b.i.d., Glucotrol 5 mg b.i.d., mag  sulfate 10 mg b.i.d., aspirin 81 mg daily, Lasix 60 mg b.i.d., KCL 20 mEq  daily.   HOSPITAL COURSE:  This is a 77 year old Caucasian female patient of Dr.  Barbee Shropshire who presented to the emergency room with onset of acute respiratory  distress including severe shortness of breath and weakness.  The patient  underwent chest x-ray which showed acute pulmonary edema on chronic  congestive heart failure.  She was admitted to the telemetry unit, given a  total initial dose of 220 mg Lasix and started on BiPAP which significantly  improved the patient's congestion.  She also was found to be in atrial  flutter on telemetry intermittent with atrial fibrillation and bolus of  Cardizem IV was given to the patient after which she was converted to normal  sinus rhythm without any drip.  This admission was on Friday and because of  the patient's respiratory status, it was impossible to schedule her for  cardiac catheterization on the same day so she stayed over the weekend and  on Monday, April 24, Dr. Jenne Campus performed a cardiac catheterization which  showed  reduced EF, her prior EF was 64% and now is reduced to 25-30%.  The  left ventricle was significantly dilated.  There was a total occlusion of  the RCA with left to right collaterals, it showed disease in the first  diagonal, 80%, mid LAD disease, 40% stenosis, and first obtuse marginal  branch of the circumflex 40%, and circumflex 30% stenosis.  Cath also showed  that the patient has mitral regurgitation and the question was raised that  she would be a good candidate for mitral valve replacement and coronary  artery bypass grafting but before ordering CVTS consult, the decision was  made to order a 2D echo.  2D echo was performed on October 19, 2004, and it  showed left ventricular function ranged 20-30, around 25%, left ventricular  hypokinesis with regional variations but without diagnostic evidence for  regional wall motion abnormalities.  Mitral valve showed mild valvular  regurgitation and jet was central.  There was a mild thickening of the  mitral valve involving anterior and posterior leaflets with mild leaflet  motion and with no chordal involvement.  2D  echo showed pericardial effusion  circumferential to the heart, effusion dimensions are 5 mm anterior and 5 mm  posterior.  On the day of discharge, April 26, Dr. Elsie Lincoln assessed the  patient and found him to be stable for discharge home.  Her vital signs were  stable and her physical exam was benign with a 1/6 systolic murmur.  She was  discharged home on the above mentioned medications and, also, with the  following recommendations:   1.  Avoid exertional activity.  2.  No driving and no lifting greater than 5 pounds for a couple of days      after discharge.  3.  Discharge diet low salt, low cholesterol diet.  The patient was      instructed to discontinue Lanoxin and Norvasc.   DISCHARGE FOLLOW UP:  Dr. Elsie Lincoln will see the patient on Nov 18, 2004, at 2  p.m.  The patient initially was seen in our office as a patient of Dr.   Allyson Sabal, but at the time of discharge, she requested change of physician and  she chose to be a patient of Dr. Elsie Lincoln and per patient request, appointment  was scheduled with him.      MK/MEDQ  D:  10/20/2004  T:  10/20/2004  Job:  [id number]

## 2010-11-12 NOTE — H&P (Signed)
NAMEAAREN, KROG NO.:  [id number]   MEDICAL RECORD NO.:  [id number]          PATIENT TYPE:  EMS   LOCATION:  MAJO                         FACILITY:  [facility]   PHYSICIAN:  Ulyses Amor, MD DATE OF BIRTH:  January 27, 1928   DATE OF ADMISSION:  10/15/2004  DATE OF DISCHARGE:                                HISTORY & PHYSICAL   [name] is a 77 year old white woman who was admitted to [facility] with respiratory failure and acute pulmonary edema reflecting an  exacerbation of her chronic congestive heart failure.   The patient has a longstanding history of congestive heart failure.  She has  coronary artery disease and has previously suffered a myocardial infarction.  She has also undergone the placement of several stents.   The patient presented to the emergency department via EMS after experiencing  the acute onset of shortness of breath which began while she was asleep.  There was no associated chest pain, tightness, heaviness, pressure, or  squeezing.  Upon arrival to the emergency room, she was noted to be in  atrial fibrillation with a rapid ventricular rate.  In addition, it was felt  that she was heading toward intubation.  Treatment was initiated with BIPAP  as well as diuretics.  Her respiratory status improved to the point where  intubation was not medically necessary.  She is now admitted for further  management.   In addition to the cardiac disease noted above, she has a history of  hypertension and noninsulin-dependent diabetes mellitus.   The patient is on a number of medications for the aforementioned problems.  These include:  1.  Altace 5 mg p.o. b.i.d.  2.  Norvasc 10 mg p.o. daily.  3.  Digoxin 0.125 mg p.o. daily.  4.  Furosemide 40 mg p.o. b.i.d.  5.  Toprol XL 25 mg p.o. daily.   ALLERGIES:  None.   OPERATIONS:  None.   SOCIAL:  The patient lives with her son's mother-in-law.  She neither drinks  nor smokes.  She  is widowed.  She does not work.   REVIEW OF SYSTEMS:  As best as can be obtained, revealed no new problems  related to her head, eyes, ears, nose, mouth, throat, lungs,  gastrointestinal system, genitourinary system, or extremities.  There is no  history of neurologic or psychiatric disorder.  There is no history of  fever, chills, or weight loss.   PHYSICAL EXAMINATION:  VITAL SIGNS:  Blood pressure 179/126; pulse 146 and  irregularly irregular; respirations 38; temperature normal.  There were the  initial vital signs.  The vital signs at the time of my examination revealed  pulse 101, which was regular; and a respiratory rate of 18.  GENERAL:  The patient was an elderly white woman in moderate respiratory  distress.  A BIPAP mask was in place.  She was alert and verbally  responsive.  HEAD, EYES, NOSE, MOUTH:  Normal.  NECK:  Without thyromegaly or adenopathy.  Carotid pulses were palpable  bilaterally and without bruits.  CARDIAC:  Revealed the presence of an S3.  S1 and S2 were normal, and there  was no murmur, rub, or click.  Cardiac rhythm was regular.  No chest wall  tenderness was noted.  LUNGS:  Revealed bibasilar rales.  ABDOMEN:  Soft and nontender.  There was no mass, hepatosplenomegaly, bruit,  distention, rebound, guarding, or rigidity.  Bowel sounds were normal.  BREAST/PELVIC/RECTAL:  Not performed, as they were not pertinent to the  reason for acute care hospitalization.  EXTREMITIES:  Without edema, deviation, or deformity.  Radial and dorsalis  pedal pulses were palpable bilaterally.  NEUROLOGIC:  Brief screening neurologic survey was unremarkable.   The initial electrocardiogram revealed what was probably sinus tachycardia  at the rate of 148 beats per minute.  Right bundle branch block was present.  A later electrocardiogram revealed sinus tachycardia with occasional PVCs,  right bundle branch block, a possible prior inferior myocardial infarction,  nonspecific  ST and T wave changes.  The chest radiograph, according to the  radiologist, demonstrated pulmonary edema.  BUN 20, creatinine 0.8,  potassium 3.8.  The initial set of cardiac markers revealed myoglobin 61, CK-  MB 1.1, and troponin less than 0.05.  White count 16.8, hemoglobin 13,  hematocrit 39.7.  The initial blood gas revealed pH 7.16, pO2 103, pCO2  66.7, and oxygen saturation 96.  The remaining studies were pending at the  time of this dictation.   IMPRESSION:  1.  Acute respiratory failure.  2.  Acute pulmonary edema; congestive heart failure exacerbation.  3.  Atrial fibrillation with rapid ventricular rate; resolved.  4.  Coronary artery disease.  Status post myocardial infarction.  Status      post stents.  5.  Ischemic cardiomyopathy.  6.  Noninsulin-dependent diabetes mellitus.  7.  Hypertension.   PLAN:  1.  Cardiac step-down unit.  2.  BIPAP.  3.  Serial cardiac enzymes.  4.  Diuresis.  5.  Daily weights, inputs and outputs.  6.  Intravenous nitroglycerin.  7.  Oxygen.  8.  Aspirin and heparin.  9.  Further measures per Dr. Allyson Sabal.      MSC/MEDQ  D:  10/15/2004  T:  10/15/2004  Job:  [id number]   cc:   Ulyses Amor, MD  [address]  [place], [place] [address]  Fax: [phone]   Nanetta Batty, M.D.  Fax: [phone]

## 2010-11-12 NOTE — Consult Note (Signed)
NAMEMICKI, CASSEL              ACCOUNT NO.:  [id number]   MEDICAL RECORD NO.:  [id number]          PATIENT TYPE:  OIB   LOCATION:  1007                         [facility]ACILITY:  [facility]   PHYSICIAN:  Chalmers Guest, M.D.     DATE OF BIRTH:  [birth year]   DATE OF CONSULTATION:  08/04/2005  DATE OF DISCHARGE:                                   CONSULTATION   I was requested to see Ms. [name] by her orthopedic surgeon.  The  requesting physician is Erasmo Leventhal, M.D.  The patient had rotator  cuff surgery earlier today under general anesthesia.  After awakening from  the anesthesia in short stay, the patient complained of extreme pain of her  left eye.  Anesthesia evaluated the patient and said the patient might have  a corneal laceration.  We were called after the assistant at [facility] had contacted me.  I promptly came over to short stay to examine  the patient.  The patient had been sedated; however, she was able to sit up  at the bedside.  We were not able to check visual acuity.  The patient had a  small patch on her eye.  The patient's pupils were round and reactive, both  eyes.  Lids appeared normal.  Conjunctiva was trace injection, left eye,  quiet, right eye.  The patient's cornea had central epithelial punctate  staining with a central small epithelial defect.  There were pinguecula on  the conjunctiva.  The anterior chamber was deep and quiet.  The iris was  brown.  The patient had a lens that was an intraocular lens.  There was a  good red reflex of the retina.  Ocular motility was intact.   IMPRESSION:  Corneal abrasion following general anesthesia, exposure  keratopathy.   RECOMMENDATIONS:  Bacitracin, neomycin, polymyxin ointment, compression  patch for 24 hours, and the patient will follow up at [facility] for patch removal and further evaluation and treatment.      Chalmers Guest, M.D.  Electronically Signed     RW/MEDQ  D:   08/04/2005  T:  08/05/2005  Job:  [id number]

## 2010-11-16 ENCOUNTER — Encounter (HOSPITAL_COMMUNITY): Payer: Medicare Other

## 2010-11-18 ENCOUNTER — Encounter (HOSPITAL_COMMUNITY): Payer: Medicare Other

## 2010-11-23 ENCOUNTER — Encounter (HOSPITAL_COMMUNITY): Payer: Medicare Other

## 2010-11-25 ENCOUNTER — Encounter (HOSPITAL_COMMUNITY): Payer: Medicare Other

## 2010-11-30 ENCOUNTER — Encounter (HOSPITAL_COMMUNITY): Payer: Medicare Other

## 2010-12-02 ENCOUNTER — Encounter (HOSPITAL_COMMUNITY): Payer: Medicare Other

## 2010-12-06 ENCOUNTER — Other Ambulatory Visit: Payer: Self-pay | Admitting: *Deleted

## 2010-12-06 MED ORDER — VALUE PLUS LANCETS THIN 26G MISC: Status: DC

## 2010-12-07 ENCOUNTER — Encounter (HOSPITAL_COMMUNITY): Payer: Medicare Other

## 2010-12-09 ENCOUNTER — Encounter (HOSPITAL_COMMUNITY): Payer: Medicare Other

## 2010-12-14 ENCOUNTER — Encounter (HOSPITAL_COMMUNITY): Payer: Medicare Other

## 2010-12-21 ENCOUNTER — Other Ambulatory Visit: Payer: Self-pay | Admitting: *Deleted

## 2010-12-22 MED ORDER — ALPRAZOLAM 0.25 MG PO TABS: 0.2500 mg | ORAL_TABLET | Freq: Every evening | ORAL | Status: DC | PRN

## 2010-12-22 NOTE — Telephone Encounter (Signed)
Please call in

## 2010-12-23 NOTE — Telephone Encounter (Signed)
Rx called to pharmacy

## 2011-01-13 ENCOUNTER — Encounter: Payer: Self-pay | Admitting: Family Medicine

## 2011-01-14 ENCOUNTER — Ambulatory Visit: Payer: Medicare Other | Admitting: Family Medicine

## 2011-01-20 ENCOUNTER — Encounter: Payer: Self-pay | Admitting: Family Medicine

## 2011-01-20 ENCOUNTER — Ambulatory Visit (INDEPENDENT_AMBULATORY_CARE_PROVIDER_SITE_OTHER): Payer: Medicare Other | Admitting: Family Medicine

## 2011-01-20 VITALS — BP 122/80 | HR 42 | Temp 98.7°F | Ht 61.0 in | Wt 164.0 lb

## 2011-01-20 DIAGNOSIS — I4891 Unspecified atrial fibrillation: Secondary | ICD-10-CM

## 2011-01-20 DIAGNOSIS — R5381 Other malaise: Secondary | ICD-10-CM

## 2011-01-20 DIAGNOSIS — R5383 Other fatigue: Secondary | ICD-10-CM

## 2011-01-20 DIAGNOSIS — E119 Type 2 diabetes mellitus without complications: Secondary | ICD-10-CM

## 2011-01-20 DIAGNOSIS — R109 Unspecified abdominal pain: Secondary | ICD-10-CM | POA: Insufficient documentation

## 2011-01-20 NOTE — Assessment & Plan Note (Signed)
Benign exam today w/o localizing sx.  Will follow clinically.  She agrees .

## 2011-01-20 NOTE — Assessment & Plan Note (Signed)
Check A1c and I'll talk with pt about PM snack to decrease hypoglycemia at night.  Goal A1c ~8 to dec hypoglycemia.

## 2011-01-20 NOTE — Progress Notes (Signed)
Diabetes:  Still with occ hypoglycemia at night and hyperglycemia during the day.  Hypoglycemic episodes:occ Hyperglycemic episodes: occ Blood Sugars averaging: ~130 this AM, but has been variable We talked about options today, along with diet.  She's hoping to get back in pulm rehab for exercise.  Bradycardia- noted today.  Also with fatigue.  Still using O2 at night.   Occ abdominal discomfort, may only last a few seconds, then resolves.  No other associated sx. No sx now.   PMH and SH reviewed  Meds, vitals, and allergies reviewed.   ROS: See HPI.  Otherwise negative.    GEN: nad, alert and oriented HEENT: mucous membranes moist NECK: supple w/o LA CV: brady but regular PULM: no inc wob, no wheeze ABD: soft, +bs, not ttp EXT: no edema SKIN: no acute rash

## 2011-01-20 NOTE — Assessment & Plan Note (Signed)
[name] but regular today.  No CP, no BLE edema.  Nontoxic.  Will dec BB and have her f/u tomorrow.  She needs help  With her meds and I'll go through them with her tomorrow (in her pill container for th week).

## 2011-01-20 NOTE — Patient Instructions (Signed)
Take the toprol-xl once a day instead of twice.  It is a white oval pill with a score across the middle.   Bring all of your pills to the clinic tomorrow for [place] to check.  Schedule a visit with me to do this.  Thanks.   I'll let you know about your lab result tomorrow.

## 2011-01-20 NOTE — Assessment & Plan Note (Signed)
Likely multifactorial, will follow.

## 2011-01-21 ENCOUNTER — Ambulatory Visit (INDEPENDENT_AMBULATORY_CARE_PROVIDER_SITE_OTHER): Payer: Medicare Other | Admitting: Family Medicine

## 2011-01-21 DIAGNOSIS — I4891 Unspecified atrial fibrillation: Secondary | ICD-10-CM

## 2011-01-21 LAB — HEMOGLOBIN A1C: Hgb A1c MFr Bld: 8.6 % — ABNORMAL HIGH (ref 4.6–6.5)

## 2011-01-23 NOTE — Assessment & Plan Note (Signed)
With bradycardia on toprol dose >25mg .  Med list adjusted and all meds resorted.  >15 min spent with face to face with patient, >50% counseling.

## 2011-01-23 NOTE — Progress Notes (Signed)
Her for f/u of bradycardia.  Held BB this AM and is feeling better.  We decided to hold the crestor as she was continuing to have aches, even with MWF dosing.  Her pills had been mixed up when her pill container popped open.  I went through all the pills with a photo ID and resorted all of them.    Meds, vitals, and allergies reviewed.   ROS: See HPI.  Otherwise, noncontributory.  nad ncat Rrr, pulse ranging from upper 50s to lower 60s No edema

## 2011-02-01 ENCOUNTER — Telehealth: Payer: Self-pay | Admitting: *Deleted

## 2011-02-01 MED ORDER — METOPROLOL SUCCINATE ER 25 MG PO TB24: 25.0000 mg | ORAL_TABLET | Freq: Every day | ORAL | Status: DC

## 2011-02-01 NOTE — Telephone Encounter (Signed)
Sent. Thanks.   

## 2011-02-01 NOTE — Telephone Encounter (Signed)
Patient is not on Toprol, but Lopressor (bid dosing).  On 01/21/11, your office instructed her to cut the Toprol dose to once daily.  Patient is determined to decrease the Lopressor to once daily, despite the fact it's designed for BID dosing.  Please authorize Rx for Toprol for simplification.

## 2011-02-03 ENCOUNTER — Encounter: Payer: Self-pay | Admitting: Pulmonary Disease

## 2011-02-03 ENCOUNTER — Ambulatory Visit (INDEPENDENT_AMBULATORY_CARE_PROVIDER_SITE_OTHER): Payer: Medicare Other | Admitting: Pulmonary Disease

## 2011-02-03 DIAGNOSIS — J449 Chronic obstructive pulmonary disease, unspecified: Secondary | ICD-10-CM

## 2011-02-03 DIAGNOSIS — J9610 Chronic respiratory failure, unspecified whether with hypoxia or hypercapnia: Secondary | ICD-10-CM

## 2011-02-03 DIAGNOSIS — I2789 Other specified pulmonary heart diseases: Secondary | ICD-10-CM

## 2011-02-03 NOTE — Progress Notes (Signed)
  Subjective:    Patient ID: [name], female    DOB: [birth year], 77 y.o.   MRN: [id number]  HPI Copy to: Andree Coss  77 yo female with dyspnea, chronic respiratory failure, COPD, diastolic dysfunction, OHS with probable OSA, and 2nd pulm. HTN.  She has been feeling better.  She is using combivent about once per day.  She is worried about her medication expense.  She does not have much cough, wheeze, or sputum.  She was doing well in pulmonary rehab until she fell and had a hairline knee fracture.  She has been using her oxygen at night, and at times during the day when she gets winded.  Review of Systems     Objective:   Physical Exam BP 116/62  Pulse 64  Temp(Src) 98.3 F (36.8 C) (Oral)  Wt 163 lb (73.936 kg)  SpO2 98%  General: obese.  Nose: narrow nasal angles, no discharge, no tenderness  Mouth: MP 4, enlarged tongue, scalloped border  Neck: no JVD.  Lungs: diminished breath sounds, no wheezing, no rales, no dullness  Heart: regular rhythm, normal rate, and no murmurs.  Extremities: minimal ankle edema, no cyanosis or clubbing  Neurologic: normal CN II-XII.  Cervical Nodes: no significant adenopathy       Assessment & Plan:

## 2011-02-03 NOTE — Assessment & Plan Note (Signed)
She has improvement with inhaler therapy. Will continue symbicort and as needed combivent. She will check with ortho about whether she can restart pulmonary rehab.

## 2011-02-03 NOTE — Assessment & Plan Note (Signed)
Encouraged her to use oxygen when asleep and with activity as needed.

## 2011-02-03 NOTE — Patient Instructions (Signed)
Follow up in 6 months 

## 2011-02-03 NOTE — Assessment & Plan Note (Signed)
Related to diastolic dysfunction, probable OSA/OHS, and possible COPD. Will continue to optimize therapy for secondary causes as best as possible. I do not think she would be an appropriate candidate for specific therapy directed at her pulmonary hypertension itself.

## 2011-02-09 ENCOUNTER — Telehealth: Payer: Self-pay | Admitting: *Deleted

## 2011-02-09 ENCOUNTER — Emergency Department (HOSPITAL_COMMUNITY)
Admission: EM | Admit: 2011-02-09 | Discharge: 2011-02-09 | Disposition: A | Discharge status: Home / Self Care | Payer: Medicare Other | Attending: Emergency Medicine | Admitting: Emergency Medicine

## 2011-02-09 DIAGNOSIS — Z7901 Long term (current) use of anticoagulants: Secondary | ICD-10-CM | POA: Insufficient documentation

## 2011-02-09 DIAGNOSIS — R040 Epistaxis: Secondary | ICD-10-CM | POA: Insufficient documentation

## 2011-02-09 DIAGNOSIS — I4891 Unspecified atrial fibrillation: Secondary | ICD-10-CM | POA: Insufficient documentation

## 2011-02-09 DIAGNOSIS — Z79899 Other long term (current) drug therapy: Secondary | ICD-10-CM | POA: Insufficient documentation

## 2011-02-09 DIAGNOSIS — I10 Essential (primary) hypertension: Secondary | ICD-10-CM | POA: Insufficient documentation

## 2011-02-09 DIAGNOSIS — I2510 Atherosclerotic heart disease of native coronary artery without angina pectoris: Secondary | ICD-10-CM | POA: Insufficient documentation

## 2011-02-09 DIAGNOSIS — I509 Heart failure, unspecified: Secondary | ICD-10-CM | POA: Insufficient documentation

## 2011-02-09 DIAGNOSIS — E119 Type 2 diabetes mellitus without complications: Secondary | ICD-10-CM | POA: Insufficient documentation

## 2011-02-09 DIAGNOSIS — T45515A Adverse effect of anticoagulants, initial encounter: Secondary | ICD-10-CM | POA: Insufficient documentation

## 2011-02-09 LAB — CBC
Hemoglobin: 11.3 g/dL — ABNORMAL LOW (ref 12.0–15.0)
MCH: 29.3 pg (ref 26.0–34.0)
MCHC: 31.6 g/dL (ref 30.0–36.0)
Platelets: 236 10*3/uL (ref 150–400)
RDW: 14 % (ref 11.5–15.5)

## 2011-02-09 LAB — PROTIME-INR
INR: 4.62 — ABNORMAL HIGH (ref 0.00–1.49)
Prothrombin Time: 44.3 seconds — ABNORMAL HIGH (ref 11.6–15.2)

## 2011-02-09 NOTE — Telephone Encounter (Signed)
Patient advised that we do not have any lab results to forward to Dr. Allyson Sabal.

## 2011-02-09 NOTE — Telephone Encounter (Signed)
Spoke with Dr. Dayton Martes and Dr. Milinda Antis and was advised that we do not have any method available to [place] to stop the nosebleed, i.e nasal cautery or nasal tampon.  Both of these 2 physicians advised the patient to go to UC or ER so that ENT could handle the situation appropriately.  Patient was advised.  She asked that her BP be checked prior to leaving.  Regina checked her BP :  140/80.  Patient's son-in-law was with her and was to transport her.

## 2011-02-09 NOTE — Telephone Encounter (Signed)
See note

## 2011-02-09 NOTE — Telephone Encounter (Signed)
Patient says Dr. Allyson Sabal was asking if we had done any labs recently to check cholesterol and if so, to send him the results.  It does not look like she has had any lipid tests.  If I am mistaken, please let me know.

## 2011-02-09 NOTE — Telephone Encounter (Signed)
I don't have a recent lipid.  You are correct.

## 2011-02-09 NOTE — Telephone Encounter (Signed)
Patient walked in saying her nose has been bleeding since 3 a.m.  She awoke with blood all over her bed.  She has had some clots also.  There is a steady flow of blood from her left nostril.  Dr. Para March is not here until 2 p.m.  Can you work her in or at least give some advice?

## 2011-04-01 ENCOUNTER — Ambulatory Visit (INDEPENDENT_AMBULATORY_CARE_PROVIDER_SITE_OTHER): Payer: Medicare Other | Admitting: Family Medicine

## 2011-04-01 ENCOUNTER — Encounter: Payer: Self-pay | Admitting: Family Medicine

## 2011-04-01 VITALS — BP 126/62 | HR 56 | Temp 98.2°F | Wt 165.0 lb

## 2011-04-01 DIAGNOSIS — E119 Type 2 diabetes mellitus without complications: Secondary | ICD-10-CM

## 2011-04-01 DIAGNOSIS — M25519 Pain in unspecified shoulder: Secondary | ICD-10-CM

## 2011-04-01 DIAGNOSIS — Z23 Encounter for immunization: Secondary | ICD-10-CM

## 2011-04-01 DIAGNOSIS — L989 Disorder of the skin and subcutaneous tissue, unspecified: Secondary | ICD-10-CM

## 2011-04-01 DIAGNOSIS — I10 Essential (primary) hypertension: Secondary | ICD-10-CM

## 2011-04-01 DIAGNOSIS — R21 Rash and other nonspecific skin eruption: Secondary | ICD-10-CM

## 2011-04-01 MED ORDER — TRIAMCINOLONE ACETONIDE 0.1 % EX CREA: TOPICAL_CREAM | Freq: Two times a day (BID) | CUTANEOUS | Status: DC

## 2011-04-01 NOTE — Progress Notes (Signed)
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    Now with R shoulder pain, pain at night laying on that side.  Some pain with movement.  No trauma.  No change in grip.    Hypertension:    Using medication without problems: yes Chest pain with exertion: no Edema: no Short of breath:at baseline  "Skin troubles".  Lesion on L pinna that won't heal. Present for a few months.  No discharge, no trauma.    Meds, vitals, and allergies reviewed.   PMH and SH reviewed  ROS: See HPI.  Otherwise negative.    GEN: nad, alert and oriented HEENT: mucous membranes moist, L pinna with 3-4 mm area of irritated skin that appears to be poorly healing with central brown raised area.  No fluctuance.   NECK: supple w/o LA CV: rrr. PULM: no inc wob, no wheeze ABD: soft, +bs EXT: trace edema SKIN: also with excoriated lesions noted on R forearm w/o fluctuance.  R subscapularis pain noted on testing

## 2011-04-01 NOTE — Patient Instructions (Signed)
Don't change your regular meds for now.  Use the cream on the spots on your right arm.  Let me know if those (and the spot on your ear) doesn't get better.  i would recheck your A1c in about 2-3 months with a 30 min OV a few days later.  Take care.

## 2011-04-03 ENCOUNTER — Encounter: Payer: Self-pay | Admitting: Family Medicine

## 2011-04-03 DIAGNOSIS — L989 Disorder of the skin and subcutaneous tissue, unspecified: Secondary | ICD-10-CM | POA: Insufficient documentation

## 2011-04-03 DIAGNOSIS — M25519 Pain in unspecified shoulder: Secondary | ICD-10-CM | POA: Insufficient documentation

## 2011-04-03 NOTE — Assessment & Plan Note (Signed)
No change in meds.

## 2011-04-03 NOTE — Assessment & Plan Note (Signed)
Continue with ROM exercises.

## 2011-04-03 NOTE — Assessment & Plan Note (Signed)
Use TAC 0.1% for the excoriations to see if that will allow for healing.

## 2011-04-03 NOTE — Assessment & Plan Note (Signed)
No reason to suspect melanoma.  Likely irritated SK vs SCC.  Unlikely to be a basal cell.  It is very small but I wouldn't excise given the location on the ear.  Treated with liq N2 x3 with freeze thaw cycles, tolerated well. Will retreat if needed.  She agrees to notify me of persistence.  Routine post procedure instructions given.  Verbal consent obtained preprocedure.

## 2011-04-25 ENCOUNTER — Encounter: Payer: Self-pay | Admitting: Family Medicine

## 2011-04-25 ENCOUNTER — Ambulatory Visit (INDEPENDENT_AMBULATORY_CARE_PROVIDER_SITE_OTHER): Payer: Medicare Other | Admitting: Family Medicine

## 2011-04-25 VITALS — BP 134/62 | HR 76 | Temp 97.5°F | Wt 165.1 lb

## 2011-04-25 DIAGNOSIS — N63 Unspecified lump in unspecified breast: Secondary | ICD-10-CM

## 2011-04-25 DIAGNOSIS — T148XXA Other injury of unspecified body region, initial encounter: Secondary | ICD-10-CM

## 2011-04-25 DIAGNOSIS — IMO0002 Reserved for concepts with insufficient information to code with codable children: Secondary | ICD-10-CM

## 2011-04-25 DIAGNOSIS — L989 Disorder of the skin and subcutaneous tissue, unspecified: Secondary | ICD-10-CM

## 2011-04-25 DIAGNOSIS — Z1231 Encounter for screening mammogram for malignant neoplasm of breast: Secondary | ICD-10-CM

## 2011-04-25 NOTE — Patient Instructions (Signed)
I think the oozing will get better.  I would keep the coban dressing on it for now.  Let them know about this when you have your coumadin labs done later today.  See Shirlee Limerick about your referral before your leave today.

## 2011-04-25 NOTE — Progress Notes (Signed)
Hit R forearm.  Slipped ~2 days ago. No LOC.  Minimal but noted ooze since then.  She has f/u with coumadin clinic today.    L breast mass. Present prev but enlarging recently. No mass in axilla known by patient. No nipple changes, no discharge.   L ear healing, minimal irritation at prev site.  Meds, vitals, and allergies reviewed.   ROS: See HPI.  Otherwise, noncontributory.  nad ncat L ear with lesion healed on pinna, minimal residual flaking of the skin Chaperoned exam with ~1cm nontender mass at 7:30 of L breast.  Otherwise, no mass, nodules, thickening, tenderness, bulging, retraction, inflamation, nipple discharge or skin changes noted.  No axillary or clavicular LA.   R forearm with small skin tear, <<1cm, with minimal oozing, controlled with coban pressure and gauze.  Distally NV intact.  Bruising noted on upper arm.

## 2011-04-26 ENCOUNTER — Other Ambulatory Visit: Payer: Self-pay | Admitting: Family Medicine

## 2011-04-26 DIAGNOSIS — N63 Unspecified lump in unspecified breast: Secondary | ICD-10-CM | POA: Insufficient documentation

## 2011-04-26 DIAGNOSIS — IMO0002 Reserved for concepts with insufficient information to code with codable children: Secondary | ICD-10-CM | POA: Insufficient documentation

## 2011-04-26 NOTE — Assessment & Plan Note (Signed)
Controlled with pressure dressing, she has f/u with coumadin clinic today.  I expect this to [facility].

## 2011-04-26 NOTE — Assessment & Plan Note (Signed)
Appears to be resolved. 

## 2011-04-26 NOTE — Assessment & Plan Note (Signed)
Refer for mammogram.  

## 2011-04-28 ENCOUNTER — Telehealth: Payer: Self-pay | Admitting: Family Medicine

## 2011-04-28 ENCOUNTER — Other Ambulatory Visit: Payer: Self-pay | Admitting: *Deleted

## 2011-04-28 MED ORDER — ALPRAZOLAM 0.25 MG PO TABS: 0.2500 mg | ORAL_TABLET | Freq: Every evening | ORAL | Status: DC | PRN

## 2011-04-28 NOTE — Telephone Encounter (Signed)
Medication phoned to pharmacy.  

## 2011-04-28 NOTE — Telephone Encounter (Signed)
Please see prev phone note and call pt. Today was the first I had known about this rx needing to be filled.

## 2011-04-28 NOTE — Telephone Encounter (Signed)
Please call in.  Thanks.   

## 2011-04-28 NOTE — Telephone Encounter (Signed)
This fax was transmitted at 09:12 this morning.  Patient called at 2:12 p.m and was very upset that her prescription had not been handled.  She had spoken harshly to [place] and to [place] asked that I speak with her.  I explained that we had a 24 hour period to refill medications and she said she did not know that.  She asked that this refill be done immediately because she had driven to [facility] from [place] and could not come back later.  She was very upset and spoke harshly to me as well.   I carried this request to Dr. Para March as he was about to go in to see his 2 p.m. NP and he said he would take care of it as soon as he could.

## 2011-05-05 NOTE — Telephone Encounter (Signed)
Lives in [place] but gets medicines in [place] the next day after calling in request. Called from pharmacy and received a call back while still at the pharmacy and said that nurse staff explained refill process and patient said she was not aware of process/policy previously.  I apologized for the inconvenience and experience she described and explained that I would follow up on office processes for the areas where she had delays and concerns.  She appreciated the call back and said that she has always had good experiences at [facility] and she felt this was an isolated incident and very much appreciated the care and relationship with her physician.

## 2011-05-10 ENCOUNTER — Ambulatory Visit
Admission: RE | Admit: 2011-05-10 | Discharge: 2011-05-10 | Disposition: A | Discharge status: Home / Self Care | Payer: Medicare Other | Source: Ambulatory Visit | Attending: Family Medicine | Admitting: Family Medicine

## 2011-05-10 DIAGNOSIS — N63 Unspecified lump in unspecified breast: Secondary | ICD-10-CM

## 2011-07-04 ENCOUNTER — Other Ambulatory Visit: Payer: Self-pay | Admitting: *Deleted

## 2011-07-04 MED ORDER — INSULIN REGULAR HUMAN 100 UNIT/ML IJ SOLN: INTRAMUSCULAR | Status: DC

## 2011-07-04 MED ORDER — ALPRAZOLAM 0.25 MG PO TABS: 0.2500 mg | ORAL_TABLET | Freq: Every evening | ORAL | Status: DC | PRN

## 2011-07-04 NOTE — Telephone Encounter (Signed)
Please call in

## 2011-07-05 ENCOUNTER — Telehealth: Payer: Self-pay | Admitting: *Deleted

## 2011-07-05 NOTE — Telephone Encounter (Signed)
Prior Authorization form in your in box.

## 2011-07-05 NOTE — Telephone Encounter (Signed)
Medication phoned to pharmacy.  

## 2011-07-06 NOTE — Telephone Encounter (Signed)
Will address hard copy.  

## 2011-07-07 ENCOUNTER — Other Ambulatory Visit: Payer: Self-pay | Admitting: *Deleted

## 2011-07-07 MED ORDER — ISOSORBIDE MONONITRATE ER 60 MG PO TB24: 60.0000 mg | ORAL_TABLET | ORAL | Status: DC

## 2011-07-07 NOTE — Telephone Encounter (Signed)
We got notice that this was approved, so I didn't do the PA.

## 2011-07-10 ENCOUNTER — Emergency Department (HOSPITAL_COMMUNITY): Payer: No Typology Code available for payment source

## 2011-07-10 ENCOUNTER — Emergency Department (HOSPITAL_COMMUNITY)
Admission: EM | Admit: 2011-07-10 | Discharge: 2011-07-11 | Disposition: A | Discharge status: Skilled Nursing Facility | Payer: No Typology Code available for payment source | Attending: Emergency Medicine | Admitting: Emergency Medicine

## 2011-07-10 ENCOUNTER — Encounter (HOSPITAL_COMMUNITY): Payer: Self-pay | Admitting: *Deleted

## 2011-07-10 ENCOUNTER — Other Ambulatory Visit: Payer: Self-pay

## 2011-07-10 DIAGNOSIS — Z794 Long term (current) use of insulin: Secondary | ICD-10-CM | POA: Insufficient documentation

## 2011-07-10 DIAGNOSIS — Z79899 Other long term (current) drug therapy: Secondary | ICD-10-CM | POA: Insufficient documentation

## 2011-07-10 DIAGNOSIS — R51 Headache: Secondary | ICD-10-CM | POA: Insufficient documentation

## 2011-07-10 DIAGNOSIS — S301XXA Contusion of abdominal wall, initial encounter: Secondary | ICD-10-CM | POA: Insufficient documentation

## 2011-07-10 DIAGNOSIS — J449 Chronic obstructive pulmonary disease, unspecified: Secondary | ICD-10-CM | POA: Insufficient documentation

## 2011-07-10 DIAGNOSIS — M25569 Pain in unspecified knee: Secondary | ICD-10-CM | POA: Insufficient documentation

## 2011-07-10 DIAGNOSIS — S7010XA Contusion of unspecified thigh, initial encounter: Secondary | ICD-10-CM | POA: Insufficient documentation

## 2011-07-10 DIAGNOSIS — J4489 Other specified chronic obstructive pulmonary disease: Secondary | ICD-10-CM | POA: Insufficient documentation

## 2011-07-10 DIAGNOSIS — E785 Hyperlipidemia, unspecified: Secondary | ICD-10-CM | POA: Insufficient documentation

## 2011-07-10 DIAGNOSIS — S20229A Contusion of unspecified back wall of thorax, initial encounter: Secondary | ICD-10-CM | POA: Insufficient documentation

## 2011-07-10 DIAGNOSIS — I4891 Unspecified atrial fibrillation: Secondary | ICD-10-CM | POA: Insufficient documentation

## 2011-07-10 DIAGNOSIS — Z7901 Long term (current) use of anticoagulants: Secondary | ICD-10-CM | POA: Insufficient documentation

## 2011-07-10 DIAGNOSIS — I10 Essential (primary) hypertension: Secondary | ICD-10-CM | POA: Insufficient documentation

## 2011-07-10 DIAGNOSIS — I2510 Atherosclerotic heart disease of native coronary artery without angina pectoris: Secondary | ICD-10-CM | POA: Insufficient documentation

## 2011-07-10 DIAGNOSIS — E119 Type 2 diabetes mellitus without complications: Secondary | ICD-10-CM | POA: Insufficient documentation

## 2011-07-10 LAB — CBC
HCT: 34.8 % — ABNORMAL LOW (ref 36.0–46.0)
Hemoglobin: 11 g/dL — ABNORMAL LOW (ref 12.0–15.0)
MCH: 30 pg (ref 26.0–34.0)
MCHC: 31.6 g/dL (ref 30.0–36.0)
MCV: 94.8 fL (ref 78.0–100.0)
RBC: 3.67 MIL/uL — ABNORMAL LOW (ref 3.87–5.11)

## 2011-07-10 LAB — DIFFERENTIAL
Basophils Relative: 1 % (ref 0–1)
Eosinophils Absolute: 0.1 10*3/uL (ref 0.0–0.7)
Eosinophils Relative: 1 % (ref 0–5)
Lymphs Abs: 1.6 10*3/uL (ref 0.7–4.0)
Monocytes Absolute: 0.7 10*3/uL (ref 0.1–1.0)
Monocytes Relative: 6 % (ref 3–12)
Neutrophils Relative %: 77 % (ref 43–77)

## 2011-07-10 LAB — URINALYSIS, ROUTINE W REFLEX MICROSCOPIC
Bilirubin Urine: NEGATIVE
Hgb urine dipstick: NEGATIVE
Ketones, ur: NEGATIVE mg/dL
Specific Gravity, Urine: 1.012 (ref 1.005–1.030)
Urobilinogen, UA: 0.2 mg/dL (ref 0.0–1.0)
pH: 5 (ref 5.0–8.0)

## 2011-07-10 LAB — URINE MICROSCOPIC-ADD ON

## 2011-07-10 LAB — POCT I-STAT, CHEM 8
Calcium, Ion: 1.17 mmol/L (ref 1.12–1.32)
Creatinine, Ser: 1.9 mg/dL — ABNORMAL HIGH (ref 0.50–1.10)
Glucose, Bld: 213 mg/dL — ABNORMAL HIGH (ref 70–99)
HCT: 34 % — ABNORMAL LOW (ref 36.0–46.0)
Hemoglobin: 11.6 g/dL — ABNORMAL LOW (ref 12.0–15.0)
Potassium: 5.1 mEq/L (ref 3.5–5.1)

## 2011-07-10 MED ORDER — ACETAMINOPHEN 325 MG PO TABS
650.0000 mg | ORAL_TABLET | Freq: Once | ORAL | Status: AC
  Administered 2011-07-10: 650 mg via ORAL

## 2011-07-10 MED ORDER — ACETAMINOPHEN 325 MG PO TABS
ORAL_TABLET | ORAL | Status: AC
  Administered 2011-07-10: 650 mg via ORAL
  Filled 2011-07-10: qty 2

## 2011-07-10 NOTE — ED Notes (Signed)
See assessment.
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
## 2011-07-10 NOTE — ED Notes (Signed)
Pt to radiology.

## 2011-07-10 NOTE — ED Notes (Signed)
Pt ambulates without difficulty. Pt gives urine specimen. Pt admits to generalized "soreness". Pt medicated with tylenol.

## 2011-07-10 NOTE — ED Notes (Signed)
Pt returned from radiology. No distress
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
## 2011-07-10 NOTE — ED Notes (Signed)
Pt remains in radiology, family in room talking with pharmacy

## 2011-07-11 NOTE — ED Notes (Signed)
Pt left prior to receiving d/c instructions.

## 2011-07-15 ENCOUNTER — Ambulatory Visit (INDEPENDENT_AMBULATORY_CARE_PROVIDER_SITE_OTHER): Payer: No Typology Code available for payment source | Admitting: Family Medicine

## 2011-07-15 ENCOUNTER — Encounter: Payer: Self-pay | Admitting: Family Medicine

## 2011-07-15 VITALS — BP 142/82 | HR 60 | Temp 98.2°F

## 2011-07-15 DIAGNOSIS — L570 Actinic keratosis: Secondary | ICD-10-CM

## 2011-07-15 DIAGNOSIS — T148XXA Other injury of unspecified body region, initial encounter: Secondary | ICD-10-CM

## 2011-07-15 LAB — POCT INR: INR: 5.4

## 2011-07-15 MED ORDER — HYDROCODONE-ACETAMINOPHEN 5-500 MG PO TABS: 0.5000 | ORAL_TABLET | Freq: Three times a day (TID) | ORAL | Status: DC | PRN

## 2011-07-15 NOTE — Patient Instructions (Addendum)
I would elevate the leg, use ice (with a cloth between the bag and the skin), and take tylenol for the pain.  You can take 1/2 a vicodin instead of the tylenol if needed.  If the pain suddenly and dramatically gets worse, then go to the hospital immediately.  Stop the coumadin until you see cardiology on Tuesday.  This should slowly improve.   Call cardiology and tell them your INR was 5.4 and your coumadin was held today.
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
## 2011-07-17 DIAGNOSIS — T148XXA Other injury of unspecified body region, initial encounter: Secondary | ICD-10-CM | POA: Insufficient documentation

## 2011-07-17 DIAGNOSIS — L570 Actinic keratosis: Secondary | ICD-10-CM | POA: Insufficient documentation

## 2011-07-17 NOTE — Assessment & Plan Note (Addendum)
>  25 min spent with face to face with patient, >50% counseling and/or coordinating care.  This is the main source of pain.  Given the INR, hold coumadin and then f/u with cards Tuesday about her INR.  She understood.  It is getting smaller per report, and given the INR and duration, I wouldn't try to aspirate.  I don't see reason to suspect compartment syndrome.  If suddenly worse, then she'll need to go to ER.  She understood.  She isn't driving in meantime.  She is staying with family. We talked about pain control and it appears that 1/2 tab of vicodin is reasonable to try in case plain tylenol isn't effective.  Sedation/fall caution given.

## 2011-07-17 NOTE — Assessment & Plan Note (Signed)
Noted on R ear.  Given the other concerns today, this can be addressed later.  She agrees .

## 2011-07-20 ENCOUNTER — Telehealth: Payer: Self-pay | Admitting: Family Medicine

## 2011-07-20 ENCOUNTER — Encounter: Payer: Self-pay | Admitting: Family Medicine

## 2011-07-20 ENCOUNTER — Ambulatory Visit (INDEPENDENT_AMBULATORY_CARE_PROVIDER_SITE_OTHER): Payer: No Typology Code available for payment source | Admitting: Family Medicine

## 2011-07-20 DIAGNOSIS — T148XXA Other injury of unspecified body region, initial encounter: Secondary | ICD-10-CM

## 2011-07-20 MED ORDER — HYDROCODONE-ACETAMINOPHEN 5-500 MG PO TABS: 0.5000 | ORAL_TABLET | Freq: Three times a day (TID) | ORAL | Status: AC | PRN

## 2011-07-20 NOTE — Telephone Encounter (Signed)
4:15 is fine.  Thanks.

## 2011-07-20 NOTE — Patient Instructions (Signed)
I think the pain will gradually get better.  Take the pain meds as needed.  It can make you drowsy.  Take care.

## 2011-07-20 NOTE — Telephone Encounter (Signed)
Patient called and said Dr. Para March asked her to call back if no improvement and she has had no improvement.  Leg still in pain, some tingling at ankle, and swelling is same.  I scheduled her for 4:15 today and she needed a call back to confirm this is going to work for you because she is dependent on transportation.  Thanks, [employer]

## 2011-07-20 NOTE — Telephone Encounter (Signed)
Patient notified as instructed by telephone. 
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
## 2011-07-21 ENCOUNTER — Encounter: Payer: Self-pay | Admitting: Family Medicine

## 2011-07-21 NOTE — Assessment & Plan Note (Signed)
Resolving, but with bruising now draining inferiorly.  I don't suspect compartment syndrome.  I think this is a gravity effect from the proximal injury.  D/w pt. Continue vicodin- rx printed but then shredded and called in.  F/u prn.  She had f/u with cards in the meantime about her INR and not supertherapeutic per her report.

## 2011-07-21 NOTE — Progress Notes (Signed)
Leg pain.  R thigh pain is improved. Dec in bruising in thigh.  Has been taking pain meds w/o ADE.  Now with more lower leg and foot pain,swelling, discoloration.  No new trauma.    Meds, vitals, and allergies reviewed.   ROS: See HPI.  Otherwise, noncontributory.  nad ncat rrr ctab R thigh with dec in tenderness and bruising. Inc in bruising on lower leg and foot 1+ DP pulses B No focal tenderness in lower leg/foot

## 2011-08-08 ENCOUNTER — Other Ambulatory Visit: Payer: Self-pay

## 2011-08-08 ENCOUNTER — Other Ambulatory Visit: Payer: Self-pay | Admitting: Family Medicine

## 2011-08-08 MED ORDER — FUROSEMIDE 40 MG PO TABS: 40.0000 mg | ORAL_TABLET | Freq: Two times a day (BID) | ORAL | Status: DC

## 2011-08-08 NOTE — Telephone Encounter (Signed)
[facility] faxed refill Furosemide 40 mg # 60 x 1.

## 2011-08-10 ENCOUNTER — Telehealth: Payer: Self-pay | Admitting: Pulmonary Disease

## 2011-08-10 MED ORDER — IPRATROPIUM-ALBUTEROL 18-103 MCG/ACT IN AERO: 2.0000 | INHALATION_SPRAY | RESPIRATORY_TRACT | Status: DC | PRN

## 2011-08-10 NOTE — Telephone Encounter (Signed)
I spoke with pt and she states she needed her combivent inhaler sent to [facility] pharmacy. I advised pt will send rx and nothing further was needed

## 2011-08-22 ENCOUNTER — Other Ambulatory Visit: Payer: Self-pay

## 2011-08-22 ENCOUNTER — Emergency Department (HOSPITAL_COMMUNITY): Payer: Medicare Other

## 2011-08-22 ENCOUNTER — Encounter (HOSPITAL_COMMUNITY): Payer: Self-pay

## 2011-08-22 ENCOUNTER — Inpatient Hospital Stay (HOSPITAL_COMMUNITY)
Admission: EM | Admit: 2011-08-22 | Discharge: 2011-08-26 | DRG: 291 | Disposition: A | Discharge status: Home / Self Care | Payer: Medicare Other | Attending: Internal Medicine | Admitting: Internal Medicine

## 2011-08-22 DIAGNOSIS — I4510 Unspecified right bundle-branch block: Secondary | ICD-10-CM | POA: Diagnosis present

## 2011-08-22 DIAGNOSIS — I498 Other specified cardiac arrhythmias: Secondary | ICD-10-CM | POA: Diagnosis not present

## 2011-08-22 DIAGNOSIS — E785 Hyperlipidemia, unspecified: Secondary | ICD-10-CM | POA: Diagnosis present

## 2011-08-22 DIAGNOSIS — R001 Bradycardia, unspecified: Secondary | ICD-10-CM

## 2011-08-22 DIAGNOSIS — I509 Heart failure, unspecified: Secondary | ICD-10-CM | POA: Diagnosis present

## 2011-08-22 DIAGNOSIS — J439 Emphysema, unspecified: Secondary | ICD-10-CM | POA: Diagnosis present

## 2011-08-22 DIAGNOSIS — N289 Disorder of kidney and ureter, unspecified: Secondary | ICD-10-CM | POA: Diagnosis present

## 2011-08-22 DIAGNOSIS — Z9181 History of falling: Secondary | ICD-10-CM

## 2011-08-22 DIAGNOSIS — I359 Nonrheumatic aortic valve disorder, unspecified: Secondary | ICD-10-CM | POA: Diagnosis present

## 2011-08-22 DIAGNOSIS — J441 Chronic obstructive pulmonary disease with (acute) exacerbation: Secondary | ICD-10-CM

## 2011-08-22 DIAGNOSIS — I10 Essential (primary) hypertension: Secondary | ICD-10-CM | POA: Diagnosis present

## 2011-08-22 DIAGNOSIS — I2510 Atherosclerotic heart disease of native coronary artery without angina pectoris: Secondary | ICD-10-CM | POA: Diagnosis present

## 2011-08-22 DIAGNOSIS — I2789 Other specified pulmonary heart diseases: Secondary | ICD-10-CM | POA: Diagnosis present

## 2011-08-22 DIAGNOSIS — Z808 Family history of malignant neoplasm of other organs or systems: Secondary | ICD-10-CM

## 2011-08-22 DIAGNOSIS — Z87891 Personal history of nicotine dependence: Secondary | ICD-10-CM

## 2011-08-22 DIAGNOSIS — R0602 Shortness of breath: Secondary | ICD-10-CM | POA: Diagnosis present

## 2011-08-22 DIAGNOSIS — Z7901 Long term (current) use of anticoagulants: Secondary | ICD-10-CM

## 2011-08-22 DIAGNOSIS — N179 Acute kidney failure, unspecified: Secondary | ICD-10-CM | POA: Diagnosis present

## 2011-08-22 DIAGNOSIS — J9620 Acute and chronic respiratory failure, unspecified whether with hypoxia or hypercapnia: Secondary | ICD-10-CM | POA: Diagnosis present

## 2011-08-22 DIAGNOSIS — Z886 Allergy status to analgesic agent status: Secondary | ICD-10-CM

## 2011-08-22 DIAGNOSIS — E119 Type 2 diabetes mellitus without complications: Secondary | ICD-10-CM | POA: Diagnosis present

## 2011-08-22 DIAGNOSIS — I4891 Unspecified atrial fibrillation: Secondary | ICD-10-CM | POA: Diagnosis present

## 2011-08-22 DIAGNOSIS — E876 Hypokalemia: Secondary | ICD-10-CM | POA: Diagnosis not present

## 2011-08-22 DIAGNOSIS — I480 Paroxysmal atrial fibrillation: Secondary | ICD-10-CM | POA: Diagnosis present

## 2011-08-22 DIAGNOSIS — Z825 Family history of asthma and other chronic lower respiratory diseases: Secondary | ICD-10-CM

## 2011-08-22 DIAGNOSIS — J4489 Other specified chronic obstructive pulmonary disease: Secondary | ICD-10-CM | POA: Diagnosis present

## 2011-08-22 DIAGNOSIS — Z9981 Dependence on supplemental oxygen: Secondary | ICD-10-CM

## 2011-08-22 DIAGNOSIS — Z888 Allergy status to other drugs, medicaments and biological substances status: Secondary | ICD-10-CM

## 2011-08-22 DIAGNOSIS — I5033 Acute on chronic diastolic (congestive) heart failure: Principal | ICD-10-CM | POA: Diagnosis present

## 2011-08-22 DIAGNOSIS — J449 Chronic obstructive pulmonary disease, unspecified: Secondary | ICD-10-CM | POA: Diagnosis present

## 2011-08-22 DIAGNOSIS — Z9861 Coronary angioplasty status: Secondary | ICD-10-CM | POA: Diagnosis present

## 2011-08-22 DIAGNOSIS — I350 Nonrheumatic aortic (valve) stenosis: Secondary | ICD-10-CM | POA: Diagnosis present

## 2011-08-22 DIAGNOSIS — D72829 Elevated white blood cell count, unspecified: Secondary | ICD-10-CM | POA: Diagnosis present

## 2011-08-22 DIAGNOSIS — I739 Peripheral vascular disease, unspecified: Secondary | ICD-10-CM | POA: Diagnosis present

## 2011-08-22 HISTORY — DX: Bradycardia, unspecified: R00.1

## 2011-08-22 LAB — POCT I-STAT 3, ART BLOOD GAS (G3+)
Bicarbonate: 23.5 mEq/L (ref 20.0–24.0)
TCO2: 25 mmol/L (ref 0–100)
pCO2 arterial: 41.7 mmHg (ref 35.0–45.0)
pH, Arterial: 7.359 (ref 7.350–7.400)
pO2, Arterial: 89 mmHg (ref 80.0–100.0)

## 2011-08-22 LAB — PHOSPHORUS: Phosphorus: 2.5 mg/dL (ref 2.3–4.6)

## 2011-08-22 LAB — CBC
Hemoglobin: 11.7 g/dL — ABNORMAL LOW (ref 12.0–15.0)
MCH: 30.2 pg (ref 26.0–34.0)
MCV: 97.7 fL (ref 78.0–100.0)
Platelets: 275 10*3/uL (ref 150–400)
RBC: 3.87 MIL/uL (ref 3.87–5.11)
WBC: 14.5 10*3/uL — ABNORMAL HIGH (ref 4.0–10.5)

## 2011-08-22 LAB — URINALYSIS, ROUTINE W REFLEX MICROSCOPIC
Glucose, UA: 100 mg/dL — AB
Hgb urine dipstick: NEGATIVE
Ketones, ur: NEGATIVE mg/dL
Protein, ur: 30 mg/dL — AB
Urobilinogen, UA: 0.2 mg/dL (ref 0.0–1.0)

## 2011-08-22 LAB — DIFFERENTIAL
Eosinophils Relative: 2 % (ref 0–5)
Lymphocytes Relative: 9 % — ABNORMAL LOW (ref 12–46)
Lymphs Abs: 1.3 10*3/uL (ref 0.7–4.0)
Monocytes Relative: 8 % (ref 3–12)

## 2011-08-22 LAB — BASIC METABOLIC PANEL
CO2: 25 mEq/L (ref 19–32)
Calcium: 9.5 mg/dL (ref 8.4–10.5)
Chloride: 109 mEq/L (ref 96–112)
Glucose, Bld: 222 mg/dL — ABNORMAL HIGH (ref 70–99)
Potassium: 4.1 mEq/L (ref 3.5–5.1)
Sodium: 143 mEq/L (ref 135–145)

## 2011-08-22 LAB — PRO B NATRIURETIC PEPTIDE: Pro B Natriuretic peptide (BNP): 668.8 pg/mL — ABNORMAL HIGH (ref 0–450)

## 2011-08-22 LAB — GLUCOSE, CAPILLARY

## 2011-08-22 LAB — CARDIAC PANEL(CRET KIN+CKTOT+MB+TROPI)
Relative Index: INVALID (ref 0.0–2.5)
Total CK: 82 U/L (ref 7–177)
Troponin I: <0.3 ng/mL (ref <0.30)

## 2011-08-22 LAB — PROTIME-INR: Prothrombin Time: 40.4 seconds — ABNORMAL HIGH (ref 11.6–15.2)

## 2011-08-22 LAB — MAGNESIUM: Magnesium: 2 mg/dL (ref 1.5–2.5)

## 2011-08-22 LAB — TSH: TSH: 1.857 u[IU]/mL (ref 0.350–4.500)

## 2011-08-22 MED ORDER — IPRATROPIUM-ALBUTEROL 18-103 MCG/ACT IN AERO
2.0000 | INHALATION_SPRAY | RESPIRATORY_TRACT | Status: DC | PRN
  Filled 2011-08-22: qty 14.7

## 2011-08-22 MED ORDER — PANTOPRAZOLE SODIUM 40 MG PO TBEC
40.0000 mg | DELAYED_RELEASE_TABLET | Freq: Every day | ORAL | Status: DC
  Administered 2011-08-23 – 2011-08-26 (×4): 40 mg via ORAL
  Filled 2011-08-22 (×3): qty 1

## 2011-08-22 MED ORDER — LOSARTAN POTASSIUM 50 MG PO TABS
100.0000 mg | ORAL_TABLET | Freq: Every day | ORAL | Status: DC
  Administered 2011-08-23 – 2011-08-24 (×2): 100 mg via ORAL
  Filled 2011-08-22 (×3): qty 2

## 2011-08-22 MED ORDER — WARFARIN SODIUM 5 MG PO TABS: 5.0000 mg | ORAL_TABLET | Freq: Every day | ORAL | Status: DC

## 2011-08-22 MED ORDER — ENOXAPARIN SODIUM 30 MG/0.3ML ~~LOC~~ SOLN: 30.0000 mg | SUBCUTANEOUS | Status: DC

## 2011-08-22 MED ORDER — ALBUTEROL SULFATE (5 MG/ML) 0.5% IN NEBU: 2.5000 mg | INHALATION_SOLUTION | RESPIRATORY_TRACT | Status: DC | PRN

## 2011-08-22 MED ORDER — POTASSIUM CHLORIDE CRYS ER 20 MEQ PO TBCR
20.0000 meq | EXTENDED_RELEASE_TABLET | Freq: Every day | ORAL | Status: DC
  Administered 2011-08-23 – 2011-08-25 (×3): 20 meq via ORAL
  Filled 2011-08-22 (×3): qty 1

## 2011-08-22 MED ORDER — IPRATROPIUM BROMIDE 0.02 % IN SOLN
RESPIRATORY_TRACT | Status: AC
  Administered 2011-08-22: 09:00:00
  Filled 2011-08-22: qty 5

## 2011-08-22 MED ORDER — LORAZEPAM 2 MG/ML IJ SOLN
1.0000 mg | Freq: Once | INTRAMUSCULAR | Status: AC
  Administered 2011-08-22: 1 mg via INTRAVENOUS
  Filled 2011-08-22: qty 1

## 2011-08-22 MED ORDER — METOPROLOL SUCCINATE ER 25 MG PO TB24
25.0000 mg | ORAL_TABLET | Freq: Every day | ORAL | Status: DC
  Administered 2011-08-22: 25 mg via ORAL
  Filled 2011-08-22 (×2): qty 1

## 2011-08-22 MED ORDER — FUROSEMIDE 40 MG PO TABS
40.0000 mg | ORAL_TABLET | Freq: Two times a day (BID) | ORAL | Status: DC
  Administered 2011-08-22 – 2011-08-24 (×5): 40 mg via ORAL
  Filled 2011-08-22 (×2): qty 2
  Filled 2011-08-22 (×2): qty 1
  Filled 2011-08-22: qty 2

## 2011-08-22 MED ORDER — AMIODARONE HCL 200 MG PO TABS
200.0000 mg | ORAL_TABLET | Freq: Every day | ORAL | Status: DC
  Administered 2011-08-23 – 2011-08-26 (×4): 200 mg via ORAL
  Filled 2011-08-22 (×5): qty 1

## 2011-08-22 MED ORDER — ASPIRIN 81 MG PO TABS
81.0000 mg | ORAL_TABLET | Freq: Every day | ORAL | Status: DC
  Administered 2011-08-22: 81 mg via ORAL

## 2011-08-22 MED ORDER — NIFEDIPINE ER 30 MG PO TB24
30.0000 mg | ORAL_TABLET | Freq: Every day | ORAL | Status: DC
  Administered 2011-08-23 – 2011-08-26 (×4): 30 mg via ORAL
  Filled 2011-08-22 (×4): qty 1

## 2011-08-22 MED ORDER — ALPRAZOLAM 0.25 MG PO TABS
0.2500 mg | ORAL_TABLET | Freq: Every evening | ORAL | Status: DC | PRN
  Administered 2011-08-25: 0.25 mg via ORAL
  Filled 2011-08-22: qty 1

## 2011-08-22 MED ORDER — LOSARTAN POTASSIUM 50 MG PO TABS: 100.0000 mg | ORAL_TABLET | ORAL | Status: DC

## 2011-08-22 MED ORDER — FUROSEMIDE 20 MG PO TABS
ORAL_TABLET | ORAL | Status: AC
  Administered 2011-08-22: 40 mg via ORAL
  Filled 2011-08-22: qty 2

## 2011-08-22 MED ORDER — INSULIN ASPART 100 UNIT/ML ~~LOC~~ SOLN: 0.0000 [IU] | Freq: Every day | SUBCUTANEOUS | Status: DC

## 2011-08-22 MED ORDER — NITROGLYCERIN IN D5W 200-5 MCG/ML-% IV SOLN
2.0000 ug/min | Freq: Once | INTRAVENOUS | Status: AC
  Administered 2011-08-22: 5 ug/min via INTRAVENOUS
  Filled 2011-08-22: qty 250

## 2011-08-22 MED ORDER — FUROSEMIDE 10 MG/ML IJ SOLN
80.0000 mg | Freq: Once | INTRAMUSCULAR | Status: AC
  Administered 2011-08-22: 80 mg via INTRAVENOUS
  Filled 2011-08-22: qty 8

## 2011-08-22 MED ORDER — ALBUTEROL SULFATE (5 MG/ML) 0.5% IN NEBU
INHALATION_SOLUTION | RESPIRATORY_TRACT | Status: AC
  Administered 2011-08-22: 09:00:00
  Filled 2011-08-22: qty 1.5

## 2011-08-22 MED ORDER — INSULIN ASPART 100 UNIT/ML ~~LOC~~ SOLN
8.0000 [IU] | Freq: Once | SUBCUTANEOUS | Status: AC
  Administered 2011-08-22: 8 [IU] via SUBCUTANEOUS
  Filled 2011-08-22: qty 1

## 2011-08-22 MED ORDER — ASPIRIN 81 MG PO CHEW
81.0000 mg | CHEWABLE_TABLET | ORAL | Status: AC
  Administered 2011-08-22: 81 mg via ORAL
  Filled 2011-08-22: qty 1

## 2011-08-22 MED ORDER — ONDANSETRON HCL 4 MG PO TABS
4.0000 mg | ORAL_TABLET | Freq: Four times a day (QID) | ORAL | Status: DC | PRN
  Filled 2011-08-22: qty 0.5

## 2011-08-22 MED ORDER — ISOSORBIDE MONONITRATE ER 60 MG PO TB24
60.0000 mg | ORAL_TABLET | Freq: Every day | ORAL | Status: DC
  Administered 2011-08-24 – 2011-08-26 (×3): 60 mg via ORAL
  Filled 2011-08-22 (×5): qty 1

## 2011-08-22 MED ORDER — INSULIN DETEMIR 100 UNIT/ML ~~LOC~~ SOLN
25.0000 [IU] | Freq: Every day | SUBCUTANEOUS | Status: DC
  Administered 2011-08-22: 25 [IU] via SUBCUTANEOUS
  Filled 2011-08-22: qty 3

## 2011-08-22 MED ORDER — NIFEDIPINE ER 30 MG PO TB24: 30.0000 mg | ORAL_TABLET | ORAL | Status: DC

## 2011-08-22 MED ORDER — ONDANSETRON HCL 4 MG/2ML IJ SOLN: 4.0000 mg | Freq: Four times a day (QID) | INTRAMUSCULAR | Status: DC | PRN

## 2011-08-22 MED ORDER — INSULIN ASPART 100 UNIT/ML ~~LOC~~ SOLN
0.0000 [IU] | Freq: Three times a day (TID) | SUBCUTANEOUS | Status: DC
  Administered 2011-08-23: 2 [IU] via SUBCUTANEOUS
  Administered 2011-08-23: 1 [IU] via SUBCUTANEOUS
  Administered 2011-08-24: 5 [IU] via SUBCUTANEOUS
  Administered 2011-08-25: 2 [IU] via SUBCUTANEOUS
  Administered 2011-08-25 (×2): 3 [IU] via SUBCUTANEOUS
  Administered 2011-08-26: 2 [IU] via SUBCUTANEOUS
  Administered 2011-08-26: 5 [IU] via SUBCUTANEOUS
  Filled 2011-08-22: qty 3

## 2011-08-22 MED ORDER — SODIUM CHLORIDE 0.9 % IJ SOLN
3.0000 mL | Freq: Two times a day (BID) | INTRAMUSCULAR | Status: DC
  Administered 2011-08-22 – 2011-08-26 (×8): 3 mL via INTRAVENOUS

## 2011-08-22 MED ORDER — BUDESONIDE-FORMOTEROL FUMARATE 160-4.5 MCG/ACT IN AERO
2.0000 | INHALATION_SPRAY | Freq: Two times a day (BID) | RESPIRATORY_TRACT | Status: DC
  Administered 2011-08-22 – 2011-08-26 (×7): 2 via RESPIRATORY_TRACT
  Filled 2011-08-22 (×2): qty 6

## 2011-08-22 MED ORDER — ASPIRIN 81 MG PO CHEW
81.0000 mg | CHEWABLE_TABLET | Freq: Every day | ORAL | Status: DC
  Administered 2011-08-23 – 2011-08-26 (×4): 81 mg via ORAL
  Filled 2011-08-22 (×4): qty 1

## 2011-08-22 NOTE — ED Notes (Signed)
Nursing called to verify where diet tray was and diet office stated that no tray was ordered. Nursing stated that 2 trays were ordered around 1430 this afternoon. Nursing has now placed additional order for tray to be sent up asap.

## 2011-08-22 NOTE — ED Notes (Signed)
Pt is from home and states that since last night she has been having increasing shortness of breath. Upon ems arrival pt was not moving much air. She received 2 duonebs and 1 albuterol with some relief. Pt is on home oxygen. Protocols initiated upon arrival to dept.
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
## 2011-08-22 NOTE — Consult Note (Signed)
Reason for Consult: Pulmonary Edema with Resp. Failure    Referring Physician: ER MD   [name] is an 78 y.o. female.    Chief Complaint: SOB, some chest pain    HPI: 78 year old WF well known to Dr. Allyson Sabal presented to ER today with Resp. Failure, pul edema.  She has on BiPap and is on IV NTG and been given 80 mg IV Lasix.  Currently her chest pain has resolved. She is alert and oriented. She has had increasing shortness of breath and weight gain over the last months.  She has a history of coronary disease with cardiac catheterization 01/25/2010 revealing a total right coronary artery, 50% circumflex, 60% and we and 2 with an ejection fraction of 50-55%. Previously she had angioplasty in 1992 with a right coronary artery. She had faint antegrade and more prominent retrograde collaterals from the left coronary system.  Last month she had a fall at home and had severe ecchymosis of the right leg. She was followed as an outpatient including being seen by Dr. Allyson Sabal and Doppler of that leg was also done.  She has a history of paroxysmal atrial fibrillation she is on Coumadin as well as amiodarone.  EKG today is sinus bradycardia with a right bundle branch block left posterior fascicular block no acute changes. Her last EKG in January 2013 was also bradycardic with a heart rate of 49.she is on amiodarone 200 mg daily and Toprol 25 mg daily.  Patient has been having more falls at home recently.  Past Medical History  Diagnosis Date  . Hyperlipidemia   . Hypertension   . Diabetes mellitus     Type II - goal A1C is 8, to avoid hypoglycemia  . Anxiety   . GERD (gastroesophageal reflux disease)   . CAD (coronary artery disease)     Cath Aug. 2011 > medical therapy recommended  . Diastolic dysfunction     Grade II  . RBBB (right bundle branch block)     Chronic  . Paroxysmal atrial fibrillation   . Hypoxemic respiratory failure, chronic     uses 2.5 liters of oxygen with sleep  . COPD  (chronic obstructive pulmonary disease)     GOLD 4 - PFT 06/08/10 FEV1 0.93 (62%), FEV1% 60, TLC 2.84 (69%0, DLCO 54%, +BD  . Secondary pulmonary hypertension   . Edema     Past Surgical History  Procedure Date  . Rotator cuff repair 07/2005    right    Family History  Problem Relation Age of Onset  . Asthma Mother   . Cancer Mother     Skin  . Heart disease Mother   . Asthma Father   . Emphysema Father   . Asthma Brother   . Asthma Brother    Social History:  reports that she quit smoking about 18 years ago. Her smoking use included Cigarettes. She has a 22.5 pack-year smoking history. She has never used smokeless tobacco. She reports that she drinks alcohol. She reports that she does not use illicit drugs.  Allergies:  Allergies  Allergen Reactions  . Atorvastatin     REACTION: muscle aches  . Codeine Nausea And Vomiting  . Iohexol      Desc: unknown reaction; allergic to iodine and contrast   . Morphine Nausea And Vomiting  . Rosuvastatin     REACTION: muscle aches at high doses, held as of 12/2010 due to aches    Medications Prior to Admission  Medication Dose  Route Frequency Provider Last Rate Last Dose  . albuterol (PROVENTIL) (5 MG/ML) 0.5% nebulizer solution 2.5 mg  2.5 mg Nebulization Q4H PRN Ardyth Gal, MD      . albuterol (PROVENTIL) (5 MG/ML) 0.5% nebulizer solution           . furosemide (LASIX) injection 80 mg  80 mg Intravenous Once Ardyth Gal, MD   80 mg at 08/22/11 0933  . insulin aspart (novoLOG) injection 8 Units  8 Units Subcutaneous Once Ardyth Gal, MD   8 Units at 08/22/11 1001  . ipratropium (ATROVENT) 0.02 % nebulizer solution           . LORazepam (ATIVAN) injection 1 mg  1 mg Intravenous Once Shelda Jakes, MD   1 mg at 08/22/11 1013  . nitroGLYCERIN 0.2 mg/mL in dextrose 5 % infusion  2-200 mcg/min Intravenous Once Ardyth Gal, MD 1.5 mL/hr at 08/22/11 0933 5 mcg/min at 08/22/11 0933   Medications Prior to  Admission  Medication Sig Dispense Refill  . albuterol-ipratropium (COMBIVENT) 18-103 MCG/ACT inhaler Inhale 2 puffs into the lungs every 4 (four) hours as needed. For shortness of breath  1 Inhaler  3  . ALPRAZolam (XANAX) 0.25 MG tablet Take 0.25 mg by mouth at bedtime as needed. And can take 1/2 to 1 tablet twice daily as needed for anxiety.      Marland Kitchen amiodarone (PACERONE) 200 MG tablet Take 200 mg by mouth every morning.       Marland Kitchen aspirin 81 MG tablet Take 81 mg by mouth daily.        . budesonide-formoterol (SYMBICORT) 160-4.5 MCG/ACT inhaler Inhale 2 puffs into the lungs 2 (two) times daily.        . furosemide (LASIX) 40 MG tablet Take 1 tablet (40 mg total) by mouth 2 (two) times daily.  60 tablet  1  . insulin detemir (LEVEMIR FLEXPEN) 100 UNIT/ML injection Inject 25 Units into the skin at bedtime.       . insulin regular (NOVOLIN R,HUMULIN R) 100 units/mL injection Inject 0-8 Units into the skin 3 (three) times daily before meals. Per sliding scale: 150 or less, no R, 151 or higher 4 units, > 200, 8 units      . isosorbide mononitrate (IMDUR) 60 MG 24 hr tablet Take 60 mg by mouth every morning.      Marland Kitchen losartan (COZAAR) 100 MG tablet Take 100 mg by mouth every morning.        . metoprolol succinate (TOPROL-XL) 25 MG 24 hr tablet 1/2 tablet daily       . Multiple Vitamins-Minerals (PRESERVISION/LUTEIN) CAPS Take by mouth daily.        Marland Kitchen NIFEdipine (PROCARDIA-XL) 30 MG (OSM) 24 hr tablet Take 30 mg by mouth every morning.        . pantoprazole (PROTONIX) 40 MG tablet Take 40 mg by mouth every morning.        . potassium chloride SA (KLOR-CON M20) 20 MEQ tablet Take 20 mEq by mouth every morning.        Marland Kitchen ULTIMA TEST test strip USE ONE THREE TIMES DAILY  300 each  PRN  . VALUE PLUS LANCETS THIN 26G MISC Use as directed.  200 each  11  . warfarin (COUMADIN) 5 MG tablet Take 5 mg by mouth daily.         Results for orders placed during the hospital encounter of 08/22/11 (from the past 48  hour(s))  BASIC METABOLIC PANEL  Status: Abnormal   Collection Time   08/22/11  8:59 AM      Component Value Range Comment   Sodium 143  135 - 145 (mEq/L)    Potassium 4.1  3.5 - 5.1 (mEq/L)    Chloride 109  96 - 112 (mEq/L)    CO2 25  19 - 32 (mEq/L)    Glucose, Bld 222 (*) 70 - 99 (mg/dL)    BUN 28 (*) 6 - 23 (mg/dL)    Creatinine, Ser 4.54 (*) 0.50 - 1.10 (mg/dL)    Calcium 9.5  8.4 - 10.5 (mg/dL)    GFR calc non Af Amer 40 (*) >90 (mL/min)    GFR calc Af Amer 46 (*) >90 (mL/min)   CBC     Status: Abnormal   Collection Time   08/22/11  8:59 AM      Component Value Range Comment   WBC 14.5 (*) 4.0 - 10.5 (K/uL)    RBC 3.87  3.87 - 5.11 (MIL/uL)    Hemoglobin 11.7 (*) 12.0 - 15.0 (g/dL)    HCT 09.8  11.9 - 14.7 (%)    MCV 97.7  78.0 - 100.0 (fL)    MCH 30.2  26.0 - 34.0 (pg)    MCHC 31.0  30.0 - 36.0 (g/dL)    RDW 82.9  56.2 - 13.0 (%)    Platelets 275  150 - 400 (K/uL)   DIFFERENTIAL     Status: Abnormal   Collection Time   08/22/11  8:59 AM      Component Value Range Comment   Neutrophils Relative 80 (*) 43 - 77 (%)    Neutro Abs 11.6 (*) 1.7 - 7.7 (K/uL)    Lymphocytes Relative 9 (*) 12 - 46 (%)    Lymphs Abs 1.3  0.7 - 4.0 (K/uL)    Monocytes Relative 8  3 - 12 (%)    Monocytes Absolute 1.2 (*) 0.1 - 1.0 (K/uL)    Eosinophils Relative 2  0 - 5 (%)    Eosinophils Absolute 0.4  0.0 - 0.7 (K/uL)    Basophils Relative 1  0 - 1 (%)    Basophils Absolute 0.1  0.0 - 0.1 (K/uL)   PRO B NATRIURETIC PEPTIDE     Status: Abnormal   Collection Time   08/22/11  8:59 AM      Component Value Range Comment   Pro B Natriuretic peptide (BNP) 668.8 (*) 0 - 450 (pg/mL)   CARDIAC PANEL(CRET KIN+CKTOT+MB+TROPI)     Status: Normal   Collection Time   08/22/11  8:59 AM      Component Value Range Comment   Total CK 82  7 - 177 (U/L)    CK, MB 2.3  0.3 - 4.0 (ng/mL)    Troponin I <0.30  <0.30 (ng/mL)    Relative Index RELATIVE INDEX IS INVALID  0.0 - 2.5    PROTIME-INR     Status:  Abnormal   Collection Time   08/22/11  8:59 AM      Component Value Range Comment   Prothrombin Time 40.4 (*) 11.6 - 15.2 (seconds)    INR 4.11 (*) 0.00 - 1.49    URINALYSIS, ROUTINE W REFLEX MICROSCOPIC     Status: Abnormal   Collection Time   08/22/11  9:35 AM      Component Value Range Comment   Color, Urine YELLOW  YELLOW     APPearance CLEAR  CLEAR     Specific Gravity, Urine  1.016  1.005 - 1.030     pH 5.0  5.0 - 8.0     Glucose, UA 100 (*) NEGATIVE (mg/dL)    Hgb urine dipstick NEGATIVE  NEGATIVE     Bilirubin Urine NEGATIVE  NEGATIVE     Ketones, ur NEGATIVE  NEGATIVE (mg/dL)    Protein, ur 30 (*) NEGATIVE (mg/dL)    Urobilinogen, UA 0.2  0.0 - 1.0 (mg/dL)    Nitrite NEGATIVE  NEGATIVE     Leukocytes, UA NEGATIVE  NEGATIVE    URINE MICROSCOPIC-ADD ON     Status: Normal   Collection Time   08/22/11  9:35 AM      Component Value Range Comment   Squamous Epithelial / LPF RARE  RARE     WBC, UA 0-2  <3 (WBC/hpf)    Urine-Other AMORPHOUS URATES/PHOSPHATES   MUCOUS PRESENT  POCT I-STAT 3, BLOOD GAS (G3+)     Status: Normal   Collection Time   08/22/11 10:57 AM      Component Value Range Comment   pH, Arterial 7.359  7.350 - 7.400     pCO2 arterial 41.7  35.0 - 45.0 (mmHg)    pO2, Arterial 89.0  80.0 - 100.0 (mmHg)    Bicarbonate 23.5  20.0 - 24.0 (mEq/L)    TCO2 25  0 - 100 (mmol/L)    O2 Saturation 96.0      Acid-base deficit 2.0  0.0 - 2.0 (mmol/L)    Collection site RADIAL, ALLEN'S TEST ACCEPTABLE      Drawn by Operator      Sample type ARTERIAL      Dg Chest Portable 1 View  08/22/2011  *RADIOLOGY REPORT*  Clinical Data: Shortness of breath, chest pain  PORTABLE CHEST - 1 VIEW  Comparison: 07/10/2011  Findings: Cardiomegaly with pulmonary vascular congestion.  Chronic interstitial markings, without frank interstitial edema. No pleural effusion or pneumothorax.  Prominence of the right hilum, likely vascular.  IMPRESSION: Cardiomegaly with pulmonary vascular congestion.   No frank interstitial edema.  Original Report Authenticated By: Charline Bills, M.D.    ROS: General:no recent colds, a fall one month ago with large amount of ecchymosis on the right leg improved. Skin:bruising has stated no rashes or ulcers HEENT:no blurred vision or double vision, she has had some nose bleeds on Coumadin CV:she did have chest pain today questionable secondary to heart failure versus angina from coronary disease [id number] for rales,. Rhonchi and wheezes GI:no diarrhea constipation or melena GU:no hematuria or dysuria MS:the specific joint complaints currently Neuro:no lightheadedness or dizziness Endo:she is diabetic on insulin   Blood pressure 191/61, pulse 69, temperature 97.5 F (36.4 C), temperature source Oral, resp. rate 23, SpO2 99.00%. PE: General:alert, oriented white female pleasant affect, less distress though BiPAP still in place Skin:hands are cold otherwise warm and dry brisk capillary refill HEENT:normocephalic, sclera clear, cheeks are pink Neck:supple,  no JVD, no carotid bruits Heart:S1-S2 regular rate and rhythm without murmur gallop or click Lungs:decreased breath sounds occasional wheezes JWJ:XBJY nontender positive bowel sounds do not palpate liver spleen or masses Ext:no edema pedal pulses are 1+ Neuro:alert and oriented x3 follows commands moves all extremities.    Assessment/Plan Patient Active Problem List  Diagnoses  . DM  . HYPERLIPIDEMIA  . HYPERTENSION, BENIGN ESSENTIAL  . MYALGIA  . PULMONARY HYPERTENSION, SECONDARY  . ATRIAL FIBRILLATION, PAROXYSMAL  . DIASTOLIC DYSFUNCTION  . COPD  . Chronic respiratory failure  . SKIN RASH  . Gait instability  .  Fatigue  . Abdominal pain  . Skin lesion  . Shoulder pain  . Skin tear  . Breast mass  . AK (actinic keratosis)  . Hematoma  Acute on Chronic Respiratory failure on BiPap Chest Pain.  PLAN:Have examined the patient, will repeat EKG Agree with diuresis, Cardiac  enzymes are negative.  Pro BNP is 668.  She does have history of COPD with home oxygen.  Would continue the IV NTG, INR is elevated on coumadin.  Hold to drift down .  Will return to continue/complete note.  Note now completed, awaiting office notes for any further adjustments in the past medical history.   INGOLD,LAURA R 08/22/2011, 11:37 AM   I have seen and examined the patient along with [facility] R, NP.  I have reviewed the chart, notes and new data.  I agree with NP's note.  Key new complaints: Dyspnea now greatly improved (after >1200 mL diuresis). No longer orthopneic. Denies chest pain currently. By history, she states that she has been gaining weight and developing increased abdominal girth for weeks before the current episode (suggesting sodium-volume retention is cause of acute exacerbation), but also says that today "she felt like when she had her heart attack". Key examination changes: Sinus brady, widely split S2, no murmurs and no clear S3. JVP up 7-8 cm. No edema. Warm extremities. Clear lungs. Key new findings / data: cardiac enzymes normal, proBNP 688, creat clearance <50 mL/min.  Most recent EF 55-60% by both echo and nuke (06/2009), with inferior scar and no reversible ischemia.  PLAN: Resolving pulmonary edema, likely due to volume retention rather than new myocardial injury. However, she has a known intermediate stenosis of LCX artery in addition to chronic RCA occlusion.  Borderline BP limits further vasodilator therapy. Continue diuretics. If cardiac enzymes rise recommend repeat coronary angiography (with necessary precautions since she is allergic to contrast and is fully anticoagulated). Otherwise, repeat outpatient myocardial perfusion scan and avoid contrast exposure.  Thurmon Fair, MD, [facility] [phone] 08/22/2011, 1:41 PM

## 2011-08-22 NOTE — ED Notes (Signed)
Pt presents to er following increasing shortness of breath at home. She is on home oxygen but ems stated that there was a lot of tubing connected to the machine. She is normally on 2lnc but states that she was told because she had so much tubing attached to the machine that she needed to keep it a 3.5L. Pt had received 1 albuterol and 2 duoneb treatments with ems pta. Upon arrival she is still very short of breath and unable to speak in complete sentences. Pt unable to recline on the stretcher. Nasal cannula attempted but pt is too short of breath. Respiratory therapy notified and bi-pap initiated at 30% oxygen. Iv lasix given and a nitro drip initiated. Foley inserted and pt draining clear yellow urine. After about 3-4 hours pt states that she is feeling much better and bi-pap was discontinued. Pt now able to tolerate nasal cannula. Waiting on stepdown bed. Family and pt have been made aware of plan of care. Nursing to continue to monitor. All scheduled medications will be given as prescribed while down in the er. Pharmacy has been notified to please send all meds that are scheduled due to holding down in the er.

## 2011-08-22 NOTE — Plan of Care (Signed)
Problem: ICU Phase Progression Outcomes Goal: Voiding-avoid urinary catheter unless indicated Outcome: Not Progressing Pt has foley cath receiving lasix

## 2011-08-22 NOTE — Progress Notes (Signed)
ANTICOAGULATION CONSULT NOTE - Initial Consult  Pharmacy Consult for Warfarin Indication: atrial fibrillation  Allergies  Allergen Reactions  . Atorvastatin     REACTION: muscle aches  . Codeine Nausea And Vomiting  . Iohexol      Desc: unknown reaction; allergic to iodine and contrast   . Morphine Nausea And Vomiting  . Rosuvastatin     REACTION: muscle aches at high doses, held as of 12/2010 due to aches    Patient Measurements:    Vital Signs: Temp: 97.5 F (36.4 C) (02/25 0845) Temp src: Oral (02/25 0845) BP: 123/67 mmHg (02/25 1452) Pulse Rate: 63  (02/25 1452)  Labs:  Basename 08/22/11 0859  HGB 11.7*  HCT 37.8  PLT 275  APTT --  LABPROT 40.4*  INR 4.11*  HEPARINUNFRC --  CREATININE 1.24*  CKTOTAL 82  CKMB 2.3  TROPONINI <0.30   The CrCl is unknown because both a height and weight (above a minimum accepted value) are required for this calculation.  Medical History: Past Medical History  Diagnosis Date  . Hyperlipidemia   . Hypertension   . Diabetes mellitus     Type II - goal A1C is 8, to avoid hypoglycemia  . Anxiety   . GERD (gastroesophageal reflux disease)   . CAD (coronary artery disease)     Cath Aug. 2011 > medical therapy recommended  . Diastolic dysfunction     Grade II  . RBBB (right bundle branch block)     Chronic  . Paroxysmal atrial fibrillation   . Hypoxemic respiratory failure, chronic     uses 2.5 liters of oxygen with sleep  . COPD (chronic obstructive pulmonary disease)     GOLD 4 - PFT 06/08/10 FEV1 0.93 (62%), FEV1% 60, TLC 2.84 (69%0, DLCO 54%, +BD  . Secondary pulmonary hypertension   . Edema     Medications:  Home Warfarin Dose:  5 mg daily  Assessment: 78 yo F admitted 08/22/11 with increasing SOB and weight gain.  Pharmacy consulted to continue warfarin for paroxysmal atrial fibrillation.  Home warfarin regimen 5 mg daily.  Goal of Therapy:  INR 2-3   Plan:  1. No warfarin today. 2. Daily INR. 3. Follow  INR, CBC, and plan for ongoing anticoagulation and dose as clinically indicated.  [name] Pharm.D., BCPS Clinical Pharmacist 08/22/2011 3:37 PM Pager: [phone] Phone: [phone]    [name] 08/22/2011,3:29 PM

## 2011-08-22 NOTE — ED Provider Notes (Signed)
See my previous note.  Shelda Jakes, MD 08/22/11 [phone]

## 2011-08-22 NOTE — ED Notes (Signed)
Pt given ice water and grahm crackers

## 2011-08-22 NOTE — H&P (Signed)
PCP:  Crawford Givens, MD, MD   DOA:  08/22/2011  8:37 AM  Chief Complaint:  Shortness of breath  HPI: Pt is 78 yo female with PMH outlined below who presents to [facility] ED with main concern of progressively worsening shortness of breath that initially started about 1 week prior to admission and is now associated with nonproductive cough, 3- pillow orthopnea and subjective fevers and chills. Pt reports similar episodes in the past and was told it is secondary to COPD and CHF. She reports weight gain of over 10 pounds over the past month. She has not noted any changes in terms of her urine output but thinks it is unchanged. She denies recent sicknesses or hospitalizations, no sick contacts, no abdominal or urinary concerns.   Allergies: Allergies  Allergen Reactions  . Atorvastatin     REACTION: muscle aches  . Codeine Nausea And Vomiting  . Iohexol      Desc: unknown reaction; allergic to iodine and contrast   . Morphine Nausea And Vomiting  . Rosuvastatin     REACTION: muscle aches at high doses, held as of 12/2010 due to aches    Prior to Admission medications   Medication Sig Start Date End Date Taking? Authorizing Provider  albuterol-ipratropium (COMBIVENT) 18-103 MCG/ACT inhaler Inhale 2 puffs into the lungs every 4 (four) hours as needed. For shortness of breath 08/10/11  Yes [name], MD  ALPRAZolam Prudy Feeler) 0.25 MG tablet Take 0.25 mg by mouth at bedtime as needed. And can take 1/2 to 1 tablet twice daily as needed for anxiety. 07/04/11  Yes Joaquim Nam, MD  amiodarone (PACERONE) 200 MG tablet Take 200 mg by mouth every morning.    Yes Historical Provider, MD  aspirin 81 MG tablet Take 81 mg by mouth daily.     Yes Historical Provider, MD  budesonide-formoterol (SYMBICORT) 160-4.5 MCG/ACT inhaler Inhale 2 puffs into the lungs 2 (two) times daily.     Yes Historical Provider, MD  furosemide (LASIX) 40 MG tablet Take 1 tablet (40 mg total) by mouth 2 (two) times daily. 08/08/11  Yes  Joaquim Nam, MD  insulin detemir (LEVEMIR FLEXPEN) 100 UNIT/ML injection Inject 25 Units into the skin at bedtime.    Yes Historical Provider, MD  insulin regular (NOVOLIN R,HUMULIN R) 100 units/mL injection Inject 0-8 Units into the skin 3 (three) times daily before meals. Per sliding scale: 150 or less, no R, 151 or higher 4 units, > 200, 8 units 07/04/11  Yes Joaquim Nam, MD  isosorbide mononitrate (IMDUR) 60 MG 24 hr tablet Take 60 mg by mouth every morning. 07/07/11  Yes Joaquim Nam, MD  losartan (COZAAR) 100 MG tablet Take 100 mg by mouth every morning.     Yes Historical Provider, MD  metoprolol succinate (TOPROL-XL) 25 MG 24 hr tablet 1/2 tablet daily  02/01/11  Yes Joaquim Nam, MD  Multiple Vitamins-Minerals (PRESERVISION/LUTEIN) CAPS Take by mouth daily.     Yes Historical Provider, MD  NIFEdipine (PROCARDIA-XL) 30 MG (OSM) 24 hr tablet Take 30 mg by mouth every morning.     Yes Historical Provider, MD  pantoprazole (PROTONIX) 40 MG tablet Take 40 mg by mouth every morning.     Yes Historical Provider, MD  potassium chloride SA (KLOR-CON M20) 20 MEQ tablet Take 20 mEq by mouth every morning.     Yes Historical Provider, MD  ULTIMA TEST test strip USE ONE THREE TIMES DAILY 08/08/11  Yes Joaquim Nam,  MD  VALUE PLUS LANCETS THIN 26G MISC Use as directed. 12/06/10  Yes Joaquim Nam, MD  warfarin (COUMADIN) 5 MG tablet Take 5 mg by mouth daily.    Yes Historical Provider, MD    Past Medical History  Diagnosis Date  . Hyperlipidemia   . Hypertension   . Diabetes mellitus     Type II - goal A1C is 8, to avoid hypoglycemia  . Anxiety   . GERD (gastroesophageal reflux disease)   . CAD (coronary artery disease)     Cath Aug. 2011 > medical therapy recommended  . Diastolic dysfunction     Grade II  . RBBB (right bundle branch block)     Chronic  . Paroxysmal atrial fibrillation   . Hypoxemic respiratory failure, chronic     uses 2.5 liters of oxygen with sleep  . COPD  (chronic obstructive pulmonary disease)     GOLD 4 - PFT 06/08/10 FEV1 0.93 (62%), FEV1% 60, TLC 2.84 (69%0, DLCO 54%, +BD  . Secondary pulmonary hypertension   . Edema     Past Surgical History  Procedure Date  . Rotator cuff repair 07/2005    right    Social History:  reports that she quit smoking about 18 years ago. Her smoking use included Cigarettes. She has a 22.5 pack-year smoking history. She has never used smokeless tobacco. She reports that she drinks alcohol. She reports that she does not use illicit drugs.  Family History  Problem Relation Age of Onset  . Asthma Mother   . Cancer Mother     Skin  . Heart disease Mother   . Asthma Father   . Emphysema Father   . Asthma Brother   . Asthma Brother     Review of Systems:  Constitutional: Denies diaphoresis, appetite change and fatigue.  HEENT: Denies photophobia, eye pain, redness, hearing loss, ear pain, congestion, sore throat, rhinorrhea, sneezing, mouth sores, trouble swallowing, neck pain, neck stiffness and tinnitus.   Respiratory: Denies chest tightness,  and wheezing.   Cardiovascular: Denies chest pain, palpitations and leg swelling.  Gastrointestinal: Denies nausea, vomiting, abdominal pain, diarrhea, constipation, blood in stool and abdominal distention.  Genitourinary: Denies dysuria, urgency, frequency, hematuria, flank pain and difficulty urinating.  Musculoskeletal: Denies myalgias, back pain, joint swelling, arthralgias and gait problem.  Skin: Denies pallor, rash and wound.  Neurological: Denies dizziness, seizures, syncope, weakness, light-headedness, numbness and headaches.  Hematological: Denies adenopathy. Easy bruising, personal or family bleeding history  Psychiatric/Behavioral: Denies suicidal ideation, mood changes, confusion, nervousness, sleep disturbance and agitation   Physical Exam:  Filed Vitals:   08/22/11 0845 08/22/11 0910 08/22/11 1229  BP: 191/61  104/39  Pulse: 73 69 58    Temp: 97.5 F (36.4 C)    TempSrc: Oral    Resp: 23  16  SpO2: 94% 99% 100%    Constitutional: Vital signs reviewed.  Patient is a well-developed and well-nourished in no acute distress and cooperative with exam. Alert and oriented x3.  Head: Normocephalic and atraumatic Ear: TM normal bilaterally Mouth: no erythema or exudates, MMM Eyes: PERRL, EOMI, conjunctivae normal, No scleral icterus.  Neck: Supple, Trachea midline normal ROM, No JVD, mass, thyromegaly, or carotid bruit present.  Cardiovascular: RRR, S1 normal, S2 normal, no MRG, pulses symmetric and intact bilaterally Pulmonary/Chest: CTAB with bibasilar crackles, no wheezes, rales, or rhonchi Abdominal: Soft. Non-tender, non-distended, bowel sounds are normal, no masses, organomegaly, or guarding present.  Musculoskeletal: No joint deformities, erythema, or stiffness, ROM  full and no nontender Ext: no edema and no cyanosis, pulses palpable bilaterally (DP and PT) Hematology: no cervical, inginal, or axillary adenopathy.  Neurological: A&O x3, Strenght is normal and symmetric bilaterally, cranial nerve II-XII are grossly intact, no focal motor deficit, sensory intact to light touch bilaterally.  Skin: Warm, dry and intact. No rash, cyanosis, or clubbing.  Psychiatric: Normal mood and affect. speech and behavior is normal. Judgment and thought content normal. Cognition and memory are normal.   Labs on Admission:  Results for orders placed during the hospital encounter of 08/22/11 (from the past 48 hour(s))  BASIC METABOLIC PANEL     Status: Abnormal   Collection Time   08/22/11  8:59 AM      Component Value Range Comment   Sodium 143  135 - 145 (mEq/L)    Potassium 4.1  3.5 - 5.1 (mEq/L)    Chloride 109  96 - 112 (mEq/L)    CO2 25  19 - 32 (mEq/L)    Glucose, Bld 222 (*) 70 - 99 (mg/dL)    BUN 28 (*) 6 - 23 (mg/dL)    Creatinine, Ser 1.61 (*) 0.50 - 1.10 (mg/dL)    Calcium 9.5  8.4 - 10.5 (mg/dL)    GFR calc non Af Amer  40 (*) >90 (mL/min)    GFR calc Af Amer 46 (*) >90 (mL/min)   CBC     Status: Abnormal   Collection Time   08/22/11  8:59 AM      Component Value Range Comment   WBC 14.5 (*) 4.0 - 10.5 (K/uL)    RBC 3.87  3.87 - 5.11 (MIL/uL)    Hemoglobin 11.7 (*) 12.0 - 15.0 (g/dL)    HCT 09.6  04.5 - 40.9 (%)    MCV 97.7  78.0 - 100.0 (fL)    MCH 30.2  26.0 - 34.0 (pg)    MCHC 31.0  30.0 - 36.0 (g/dL)    RDW 81.1  91.4 - 78.2 (%)    Platelets 275  150 - 400 (K/uL)   DIFFERENTIAL     Status: Abnormal   Collection Time   08/22/11  8:59 AM      Component Value Range Comment   Neutrophils Relative 80 (*) 43 - 77 (%)    Neutro Abs 11.6 (*) 1.7 - 7.7 (K/uL)    Lymphocytes Relative 9 (*) 12 - 46 (%)    Lymphs Abs 1.3  0.7 - 4.0 (K/uL)    Monocytes Relative 8  3 - 12 (%)    Monocytes Absolute 1.2 (*) 0.1 - 1.0 (K/uL)    Eosinophils Relative 2  0 - 5 (%)    Eosinophils Absolute 0.4  0.0 - 0.7 (K/uL)    Basophils Relative 1  0 - 1 (%)    Basophils Absolute 0.1  0.0 - 0.1 (K/uL)   PRO B NATRIURETIC PEPTIDE     Status: Abnormal   Collection Time   08/22/11  8:59 AM      Component Value Range Comment   Pro B Natriuretic peptide (BNP) 668.8 (*) 0 - 450 (pg/mL)   CARDIAC PANEL(CRET KIN+CKTOT+MB+TROPI)     Status: Normal   Collection Time   08/22/11  8:59 AM      Component Value Range Comment   Total CK 82  7 - 177 (U/L)    CK, MB 2.3  0.3 - 4.0 (ng/mL)    Troponin I <0.30  <0.30 (ng/mL)    Relative Index RELATIVE  INDEX IS INVALID  0.0 - 2.5    PROTIME-INR     Status: Abnormal   Collection Time   08/22/11  8:59 AM      Component Value Range Comment   Prothrombin Time 40.4 (*) 11.6 - 15.2 (seconds)    INR 4.11 (*) 0.00 - 1.49    URINALYSIS, ROUTINE W REFLEX MICROSCOPIC     Status: Abnormal   Collection Time   08/22/11  9:35 AM      Component Value Range Comment   Color, Urine YELLOW  YELLOW     APPearance CLEAR  CLEAR     Specific Gravity, Urine 1.016  1.005 - 1.030     pH 5.0  5.0 - 8.0      Glucose, UA 100 (*) NEGATIVE (mg/dL)    Hgb urine dipstick NEGATIVE  NEGATIVE     Bilirubin Urine NEGATIVE  NEGATIVE     Ketones, ur NEGATIVE  NEGATIVE (mg/dL)    Protein, ur 30 (*) NEGATIVE (mg/dL)    Urobilinogen, UA 0.2  0.0 - 1.0 (mg/dL)    Nitrite NEGATIVE  NEGATIVE     Leukocytes, UA NEGATIVE  NEGATIVE    URINE MICROSCOPIC-ADD ON     Status: Normal   Collection Time   08/22/11  9:35 AM      Component Value Range Comment   Squamous Epithelial / LPF RARE  RARE     WBC, UA 0-2  <3 (WBC/hpf)    Urine-Other AMORPHOUS URATES/PHOSPHATES   MUCOUS PRESENT  POCT I-STAT 3, BLOOD GAS (G3+)     Status: Normal   Collection Time   08/22/11 10:57 AM      Component Value Range Comment   pH, Arterial 7.359  7.350 - 7.400     pCO2 arterial 41.7  35.0 - 45.0 (mmHg)    pO2, Arterial 89.0  80.0 - 100.0 (mmHg)    Bicarbonate 23.5  20.0 - 24.0 (mEq/L)    TCO2 25  0 - 100 (mmol/L)    O2 Saturation 96.0      Acid-base deficit 2.0  0.0 - 2.0 (mmol/L)    Collection site RADIAL, ALLEN'S TEST ACCEPTABLE      Drawn by Operator      Sample type ARTERIAL       Radiological Exams on Admission: CXR - cardiomegaly with pulmonary vascular congestion  Assessment/Plan  Principal Problem:  *Shortness of breath - secondary to diastolic CHF exacerbation - will continue to monitor in SDU for 24 hours and transfer to telemetry bed if appropriate in AM - provide Lasix for adequate diuresis - obtain daily weights, I's and O's - BMP in AM  Active Problems:  ARF (acute renal failure) - likely acute on chronic renal failure - creatinine is at pt's baseline - BMP in AM  Leukocytosis - unclear etiology - pt is afebrile - will monitor vitals per floor protocol   DM - obtain A1C   COPD - stable   HTN (hypertension) - stable  - plan of care and diagnosis, diagnostic studies and test results were discussed with pt and family at bedside - pt and  family verbalized understanding   Time Spent on  Admission: Over 30 minutes  MAGICK-Yulianna Folse 08/22/2011, 1:22 PM  [facility] hospitalist [phone]

## 2011-08-22 NOTE — ED Provider Notes (Signed)
History     CSN: [id number]  Arrival date & time 08/22/11  4696   First MD Initiated Contact with Patient 08/22/11 [phone]      Chief Complaint  Patient presents with  . Shortness of Breath    HPI Patient presents with shortness of breath that has been going on for about a week, but was worse last night.  She denies fever or chills, but does admit to cough and orthopnea.  She says she has had a head cold.   The patient notes that she has gained about 30 lbs over the past 6 months, but has not had a change in her appetite.  She also says she had a fall several days ago and hit the back of her head.  She has had several falls in the past.  She denies any loss of consciousness   The pt called EMS, and received one albuterol treatment and two duonebs in route.  She wears 2.5-3.5 liters of oxygen at home due to her COPD.    Past Medical History  Diagnosis Date  . Hyperlipidemia   . Hypertension   . Diabetes mellitus     Type II - goal A1C is 8, to avoid hypoglycemia  . Anxiety   . GERD (gastroesophageal reflux disease)   . CAD (coronary artery disease)     Cath Aug. 2011 > medical therapy recommended  . Diastolic dysfunction     Grade II  . RBBB (right bundle branch block)     Chronic  . Paroxysmal atrial fibrillation   . Hypoxemic respiratory failure, chronic     uses 2.5 liters of oxygen with sleep  . COPD (chronic obstructive pulmonary disease)     GOLD 4 - PFT 06/08/10 FEV1 0.93 (62%), FEV1% 60, TLC 2.84 (69%0, DLCO 54%, +BD  . Secondary pulmonary hypertension   . Edema     Past Surgical History  Procedure Date  . Rotator cuff repair 07/2005    right    Family History  Problem Relation Age of Onset  . Asthma Mother   . Cancer Mother     Skin  . Heart disease Mother   . Asthma Father   . Emphysema Father   . Asthma Brother   . Asthma Brother     History  Substance Use Topics  . Smoking status: Former Smoker -- 1.5 packs/day for 15 years    Types: Cigarettes      Quit date: 06/27/1993  . Smokeless tobacco: Never Used   Comment: Smoked 1.5 packs per day for 15 years, quit in 1995.  Marland Kitchen Alcohol Use: Yes     Occasional    Review of Systems  Constitutional: Negative for fever.  HENT: Positive for congestion and rhinorrhea.   Eyes: Negative for visual disturbance.  Respiratory: Positive for shortness of breath and wheezing.   Cardiovascular: Positive for chest pain. Negative for leg swelling.  Gastrointestinal: Positive for constipation. Negative for abdominal pain.  Genitourinary: Negative for dysuria.  Musculoskeletal: Negative for arthralgias.  Skin: Negative for rash.  Neurological: Positive for weakness. Negative for syncope.  Hematological: Bruises/bleeds easily.    Allergies  Atorvastatin; Codeine; Iohexol; Morphine; and Rosuvastatin  Home Medications   Current Outpatient Rx  Name Route Sig Dispense Refill  . IPRATROPIUM-ALBUTEROL 18-103 MCG/ACT IN AERO Inhalation Inhale 2 puffs into the lungs every 4 (four) hours as needed. For shortness of breath 1 Inhaler 3  . ALPRAZOLAM 0.25 MG PO TABS Oral Take 0.25 mg by mouth  at bedtime as needed. And can take 1/2 to 1 tablet twice daily as needed for anxiety.    . AMIODARONE HCL 200 MG PO TABS Oral Take 200 mg by mouth every morning.     . ASPIRIN 81 MG PO TABS Oral Take 81 mg by mouth daily.      . BUDESONIDE-FORMOTEROL FUMARATE 160-4.5 MCG/ACT IN AERO Inhalation Inhale 2 puffs into the lungs 2 (two) times daily.      . FUROSEMIDE 40 MG PO TABS Oral Take 1 tablet (40 mg total) by mouth 2 (two) times daily. 60 tablet 1  . INSULIN DETEMIR 100 UNIT/ML [place] SOLN Subcutaneous Inject 25 Units into the skin at bedtime.     . INSULIN REGULAR HUMAN 100 UNIT/ML IJ SOLN Subcutaneous Inject 0-8 Units into the skin 3 (three) times daily before meals. Per sliding scale: 150 or less, no R, 151 or higher 4 units, > 200, 8 units    . ISOSORBIDE MONONITRATE ER 60 MG PO TB24 Oral Take 60 mg by mouth every morning.     Marland Kitchen LOSARTAN POTASSIUM 100 MG PO TABS Oral Take 100 mg by mouth every morning.      Marland Kitchen METOPROLOL SUCCINATE ER 25 MG PO TB24  1/2 tablet daily     . PRESERVISION/LUTEIN PO CAPS Oral Take by mouth daily.      Marland Kitchen NIFEDIPINE 30 MG (OSM) PO TB24 Oral Take 30 mg by mouth every morning.      Marland Kitchen PANTOPRAZOLE SODIUM 40 MG PO TBEC Oral Take 40 mg by mouth every morning.      Marland Kitchen POTASSIUM CHLORIDE CRYS ER 20 MEQ PO TBCR Oral Take 20 mEq by mouth every morning.      Marland Kitchen ULTIMA TEST VI STRP  USE ONE THREE TIMES DAILY 300 each PRN  . VALUE PLUS LANCETS THIN 26G MISC  Use as directed. 200 each 11  . WARFARIN SODIUM 5 MG PO TABS Oral Take 5 mg by mouth daily. Takes 1.5 tabs on mondays and 1 tab all other days      BP 191/61  Pulse 73  Temp(Src) 97.5 F (36.4 C) (Oral)  Resp 23  SpO2 94%  Physical Exam  Constitutional: She is oriented to person, place, and time. She appears well-developed. She appears distressed.  HENT:  Head: Normocephalic and atraumatic.  Mouth/Throat: Oropharynx is clear and moist.  Eyes: EOM are normal. Pupils are equal, round, and reactive to light.  Neck: Normal range of motion. Neck supple. No JVD present.  Cardiovascular: Normal rate, regular rhythm and normal heart sounds.   Pulmonary/Chest: She is in respiratory distress. She has wheezes. She has no rales.  Abdominal: Soft. Bowel sounds are normal. There is no tenderness.  Musculoskeletal: Normal range of motion. She exhibits no edema.  Neurological: She is alert and oriented to person, place, and time.  Skin: Skin is warm. No rash noted.    ED Course  Procedures (including critical care time)  Date: 08/22/2011  Rate: 74  Rhythm: atrial fibrillation  QRS Axis: normal  Intervals: normal  ST/T Wave abnormalities: nonspecific ST changes  Conduction Disutrbances:right bundle branch block  Narrative Interpretation: Rhythm appears irregular, prior study NSR, otherwise unchanged.   Old EKG Reviewed: changes noted   Labs  Reviewed  BASIC METABOLIC PANEL - Abnormal; Notable for the following:    Glucose, Bld 222 (*)    BUN 28 (*)    Creatinine, Ser 1.24 (*)    GFR calc non Af Amer 40 (*)  GFR calc Af Amer 46 (*)    All other components within normal limits  CBC - Abnormal; Notable for the following:    WBC 14.5 (*)    Hemoglobin 11.7 (*)    All other components within normal limits  DIFFERENTIAL - Abnormal; Notable for the following:    Neutrophils Relative 80 (*)    Neutro Abs 11.6 (*)    Lymphocytes Relative 9 (*)    Monocytes Absolute 1.2 (*)    All other components within normal limits  PRO B NATRIURETIC PEPTIDE - Abnormal; Notable for the following:    Pro B Natriuretic peptide (BNP) 668.8 (*)    All other components within normal limits  PROTIME-INR - Abnormal; Notable for the following:    Prothrombin Time 40.4 (*)    INR 4.11 (*)    All other components within normal limits  CARDIAC PANEL(CRET KIN+CKTOT+MB+TROPI)  URINALYSIS, ROUTINE W REFLEX MICROSCOPIC   Dg Chest Portable 1 View  08/22/2011  *RADIOLOGY REPORT*  Clinical Data: Shortness of breath, chest pain  PORTABLE CHEST - 1 VIEW  Comparison: 07/10/2011  Findings: Cardiomegaly with pulmonary vascular congestion.  Chronic interstitial markings, without frank interstitial edema. No pleural effusion or pneumothorax.  Prominence of the right hilum, likely vascular.  IMPRESSION: Cardiomegaly with pulmonary vascular congestion.  No frank interstitial edema.  Original Report Authenticated By: Charline Bills, M.D.     1. CHF (congestive heart failure)   2. COPD with exacerbation       MDM  Pt improved on BIPAP, Lasix given and Nitro drip started for HTN and CHF.   Her Cardiologist has been consulted, she will be admitted to [facility].         Ardyth Gal, MD 08/22/11 1101

## 2011-08-23 ENCOUNTER — Other Ambulatory Visit: Payer: Self-pay

## 2011-08-23 LAB — HEPATIC FUNCTION PANEL
AST: 13 U/L (ref 0–37)
Albumin: 3.2 g/dL — ABNORMAL LOW (ref 3.5–5.2)
Alkaline Phosphatase: 105 U/L (ref 39–117)
Total Bilirubin: 0.8 mg/dL (ref 0.3–1.2)

## 2011-08-23 LAB — BASIC METABOLIC PANEL
Calcium: 9.2 mg/dL (ref 8.4–10.5)
GFR calc Af Amer: 43 mL/min — ABNORMAL LOW (ref >90)
GFR calc non Af Amer: 37 mL/min — ABNORMAL LOW (ref >90)
Glucose, Bld: 152 mg/dL — ABNORMAL HIGH (ref 70–99)
Potassium: 3.3 mEq/L — ABNORMAL LOW (ref 3.5–5.1)
Sodium: 138 mEq/L (ref 135–145)

## 2011-08-23 LAB — PROTIME-INR
INR: 4.11 — ABNORMAL HIGH (ref 0.00–1.49)
Prothrombin Time: 40.4 seconds — ABNORMAL HIGH (ref 11.6–15.2)

## 2011-08-23 LAB — CARDIAC PANEL(CRET KIN+CKTOT+MB+TROPI)
CK, MB: 2.3 ng/mL (ref 0.3–4.0)
Relative Index: INVALID (ref 0.0–2.5)
Troponin I: <0.3 ng/mL (ref <0.30)
Troponin I: <0.3 ng/mL (ref <0.30)

## 2011-08-23 LAB — CBC
MCH: 30.1 pg (ref 26.0–34.0)
Platelets: 240 10*3/uL (ref 150–400)
RBC: 3.49 MIL/uL — ABNORMAL LOW (ref 3.87–5.11)

## 2011-08-23 LAB — GLUCOSE, CAPILLARY
Glucose-Capillary: 77 mg/dL (ref 70–99)
Glucose-Capillary: 173 mg/dL — ABNORMAL HIGH (ref 70–99)

## 2011-08-23 LAB — PRO B NATRIURETIC PEPTIDE: Pro B Natriuretic peptide (BNP): 1536 pg/mL — ABNORMAL HIGH (ref 0–450)

## 2011-08-23 MED ORDER — BIOTENE DRY MOUTH MT LIQD
15.0000 mL | Freq: Two times a day (BID) | OROMUCOSAL | Status: DC
Start: 2011-08-23 — End: 2011-08-26
  Administered 2011-08-23 – 2011-08-26 (×7): 15 mL via OROMUCOSAL

## 2011-08-23 MED ORDER — POTASSIUM CHLORIDE CRYS ER 20 MEQ PO TBCR
40.0000 meq | EXTENDED_RELEASE_TABLET | Freq: Once | ORAL | Status: AC
  Administered 2011-08-23: 40 meq via ORAL
  Filled 2011-08-23: qty 2

## 2011-08-23 MED ORDER — FUROSEMIDE 10 MG/ML IJ SOLN
40.0000 mg | Freq: Once | INTRAMUSCULAR | Status: AC
  Administered 2011-08-23: 40 mg via INTRAVENOUS
  Filled 2011-08-23: qty 4

## 2011-08-23 MED ORDER — METOPROLOL SUCCINATE 12.5 MG HALF TABLET
12.5000 mg | ORAL_TABLET | Freq: Every day | ORAL | Status: DC
  Administered 2011-08-24 – 2011-08-26 (×3): 12.5 mg via ORAL
  Filled 2011-08-23 (×3): qty 1

## 2011-08-23 MED FILL — Aspirin Tab Delayed Release 81 MG: ORAL | Qty: 1 | Status: AC

## 2011-08-23 NOTE — Progress Notes (Signed)
Utilization Review Completed.[name] T2/26/2013   

## 2011-08-23 NOTE — Evaluation (Signed)
Occupational Therapy Evaluation Patient Details Name: [name] MRN: [id number] DOB: 04-30-1928 Today's Date: 08/23/2011  Problem List:  Patient Active Problem List  Diagnoses  . DM  . HYPERLIPIDEMIA  . MYALGIA  . PULMONARY HYPERTENSION, SECONDARY  . ATRIAL FIBRILLATION, PAROXYSMAL  . DIASTOLIC DYSFUNCTION  . COPD  . Chronic respiratory failure  . SKIN RASH  . Gait instability  . Fatigue  . Abdominal pain  . Skin lesion  . Shoulder pain  . Skin tear  . Breast mass  . AK (actinic keratosis)  . Hematoma  . Shortness of breath  . ARF (acute renal failure)  . HTN (hypertension)  . Leukocytosis    Past Medical History:  Past Medical History  Diagnosis Date  . Hyperlipidemia   . Hypertension   . Anxiety   . GERD (gastroesophageal reflux disease)   . CAD (coronary artery disease)     Cath Aug. 2011 > medical therapy recommended  . Diastolic dysfunction     Grade II  . RBBB (right bundle branch block)     Chronic  . Paroxysmal atrial fibrillation   . Hypoxemic respiratory failure, chronic     uses 2.5 liters of oxygen with sleep  . COPD (chronic obstructive pulmonary disease)     GOLD 4 - PFT 06/08/10 FEV1 0.93 (62%), FEV1% 60, TLC 2.84 (69%0, DLCO 54%, +BD  . Secondary pulmonary hypertension   . Edema   . Angina   . Shortness of breath   . CHF (congestive heart failure)   . Diabetes mellitus     Type II - goal A1C is 8, to avoid hypoglycemia   Past Surgical History:  Past Surgical History  Procedure Date  . Rotator cuff repair 07/2005    right  . Abdominal hysterectomy   . Cardiac stents     OT Assessment/Plan/Recommendation OT Assessment Clinical Impression Statement: Pt admitted with SOB and fluid retention. Presents with the below problem list and will benefit from skilled OT in the acute setting to set up UE exercise plan and ensure safety for d/c home.  OT Recommendation/Assessment: Patient will need skilled OT in the acute care venue OT Problem  List: Decreased activity tolerance;Decreased knowledge of use of DME or AE OT Therapy Diagnosis : Generalized weakness OT Plan OT Frequency: Min 2X/week OT Treatment/Interventions: Self-care/ADL training;Therapeutic exercise OT Recommendation Follow Up Recommendations: No post-acute OT follow up Equipment Recommended: None recommended by OT Individuals Consulted Consulted and Agree with Results and Recommendations: Patient OT Goals Acute Rehab OT Goals OT Goal Formulation: With patient Time For Goal Achievement: 7 days ADL Goals Additional ADL Goal #1: Pt will safely transfer into and out of tub/shower with use of grab bars and shower seat ADL Goal: Additional Goal #1 - Progress: Goal set today Arm Goals Additional Arm Goal #1: Pt will be I with bilateral UE theraband HEP. Arm Goal: Additional Goal #1 - Progress: Goal set today  OT Evaluation Precautions/Restrictions  Precautions Precautions: Fall Precaution Comments: O2 Prior Functioning Home Living Lives With: Alone Receives Help From: Neighbor Type of Home: Apartment Home Layout: One level Home Access: Elevator Bathroom Shower/Tub: Tub/shower unit;Curtain Firefighter: Standard Home Adaptive Equipment: Grab bars in shower;Walker - four wheeled;Straight cane;Grab bars around toilet Prior Function Level of Independence: Independent with basic ADLs;Independent with transfers;Requires assistive device for independence;Independent with homemaking with ambulation Driving: No Vocation: Retired Comments: 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 She uses a cane at times inside and a  rollator when out walking her dog. Pt reports only 1 fall in last yr due to waxed floor. Plan is for pt to initially d/c with dtr then return home ADL ADL Eating/Feeding: Simulated;Independent Where Assessed - Eating/Feeding: Edge of bed Grooming: Performed;Independent Where Assessed - Grooming: Standing at sink Upper  Body Bathing: Simulated;Set up Where Assessed - Upper Body Bathing: Sitting, bed Lower Body Bathing: Simulated;Supervision/safety;Set up Where Assessed - Lower Body Bathing: Sit to stand from bed Upper Body Dressing: Simulated;Set up Where Assessed - Upper Body Dressing: Sitting, bed Lower Body Dressing: Performed;Supervision/safety Where Assessed - Lower Body Dressing: Sit to stand from bed Toilet Transfer: Performed;Supervision/safety;Set up Toilet Transfer Details (indicate cue type and reason): requires O2 to ambulate  Toilet Transfer Method: Ambulating Toilet Transfer Equipment: Regular height toilet;Grab bars Toileting - Clothing Manipulation: Performed;Supervision/safety Where Assessed - Toileting Clothing Manipulation: Standing Toileting - Hygiene: Performed;Independent Where Assessed - Toileting Hygiene: Sit on 3-in-1 or toilet Tub/Shower Transfer: Simulated;Supervision/safety Tub/Shower Transfer Details (indicate cue type and reason): simulated in room over trash can.  Tub/Shower Transfer Method: Science writer: Grab bars;Shower seat without back Ambulation Related to ADLs: Supervision with ambulation short distances with NO RW. Pt able to walk to sink and pick up various items and bring back and place on bed with no LOB ADL Comments: Pt near baseline functioning, limited mainly by decreased O2sats with activity. Pt feels she has lost "strength" in her UE and would like theraband and HEP Vision/Perception  Vision - History Patient Visual Report: No change from baseline Cognition Cognition Arousal/Alertness: Awake/alert Overall Cognitive Status: Appears within functional limits for tasks assessed Sensation/Coordination Sensation Light Touch: Appears Intact Coordination Gross Motor Movements are Fluid and Coordinated: Yes Fine Motor Movements are Fluid and Coordinated: Yes Extremity Assessment RUE Assessment RUE Assessment: Within Functional  Limits LUE Assessment LUE Assessment: Within Functional Limits Mobility  Bed Mobility Supine to Sit: 6: Modified independent (Device/Increase time);With rails Sitting - Scoot to Edge of Bed: 6: Modified independent (Device/Increase time);With rail End of Session OT - End of Session Equipment Utilized During Treatment: Gait belt Activity Tolerance: Patient tolerated treatment well Patient left: in bed;with call bell in reach;with family/visitor present General Behavior During Session: [facility] for tasks performed Cognition: [facility] for tasks performed   [name] 08/23/2011, 1:29 PM

## 2011-08-23 NOTE — Progress Notes (Signed)
Agree with note written by Nada Boozer RNP  Pt diuresing. Clinically improved. VSS. Exam benign. I suspect that this is in part due to dietary indiscretion. Would send home on BID lasix. Can probably transfer to tele and ambulate. Runell Gess 08/23/2011 3:14 PM

## 2011-08-23 NOTE — Progress Notes (Signed)
Subjective: Breathing better  Objective: Vital signs in last 24 hours: Temp:  [97 F (36.1 C)-98.5 F (36.9 C)] 98.5 F (36.9 C) (02/26 0730) Pulse Rate:  [51-63] 51  (02/26 0730) Resp:  [16-21] 17  (02/26 0730) BP: (101-146)/(39-92) 101/69 mmHg (02/26 0730) SpO2:  [97 %-100 %] 97 % (02/26 0839) Weight:  [74.6 kg (164 lb 7.4 oz)-74.7 kg (164 lb 10.9 oz)] 74.7 kg (164 lb 10.9 oz) (02/26 0039) Weight change:  Last BM Date: 08/22/11 Intake/Output from previous day: -2370 02/25 0701 - 02/26 0700 In: 480 [P.O.:480] Out: 2850 [Urine:2850] Intake/Output this shift: Total I/O In: -  Out: 200 [Urine:200]  PE: General:A&O X 3, MAE, pleasant affect Neck:no JVD sitting up in chair Heart:[id number] Lungs:with rales in the bases Abd:+BS, soft non tender Ext:no edema, + hematoma rt. Lateral thigh just above the knee.    Lab Results:  Basename 08/23/11 0052 08/22/11 0859  WBC 9.2 14.5*  HGB 10.5* 11.7*  HCT 33.3* 37.8  PLT 240 275   BMET  Basename 08/23/11 0052 08/22/11 0859  NA 138 143  K 3.3* 4.1  CL 101 109  CO2 29 25  GLUCOSE 152* 222*  BUN 27* 28*  CREATININE 1.30* 1.24*  CALCIUM 9.2 9.5    Basename 08/23/11 0052 08/22/11 1700  TROPONINI <0.30 <0.30    Lab Results  Component Value Date   CHOL  Value: 238        ATP III CLASSIFICATION:  <200     mg/dL   Desirable  409-811  mg/dL   Borderline High  >=914    mg/dL   High       * 7/82/9562   HDL 82 01/24/2010   LDLCALC  Value: 143        Total Cholesterol/HDL:CHD Risk Coronary Heart Disease Risk Table                     Men   Women  1/2 Average Risk   3.4   3.3  Average Risk       5.0   4.4  2 X Average Risk   9.6   7.1  3 X Average Risk  23.4   11.0        Use the calculated Patient Ratio above and the CHD Risk Table to determine the patient's CHD Risk.        ATP III CLASSIFICATION (LDL):  <100     mg/dL   Optimal  130-865  mg/dL   Near or Above                    Optimal  130-159  mg/dL   Borderline  784-696  mg/dL    High  >295     mg/dL   Very High* 2/84/1324   TRIG 67 01/24/2010   CHOLHDL 2.9 01/24/2010   Lab Results  Component Value Date   HGBA1C 8.6* 01/20/2011     Lab Results  Component Value Date   TSH 1.857 08/22/2011    Hepatic Function Panel No results found for this basename: PROT,ALBUMIN,AST,ALT,ALKPHOS,BILITOT,BILIDIR,IBILI in the last 72 hours No results found for this basename: CHOL in the last 72 hours No results found for this basename: PROTIME in the last 72 hours    EKG: Orders placed during the hospital encounter of 08/22/11  . EKG 12-LEAD  . EKG 12-LEAD  . EKG 12-LEAD  . EKG 12-LEAD  . EKG    Studies/Results: Dg Chest Portable 1  View  08/22/2011  *RADIOLOGY REPORT*  Clinical Data: Shortness of breath, chest pain  PORTABLE CHEST - 1 VIEW  Comparison: 07/10/2011  Findings: Cardiomegaly with pulmonary vascular congestion.  Chronic interstitial markings, without frank interstitial edema. No pleural effusion or pneumothorax.  Prominence of the right hilum, likely vascular.  IMPRESSION: Cardiomegaly with pulmonary vascular congestion.  No frank interstitial edema.  Original Report Authenticated By: Charline Bills, M.D.    Medications: I have reviewed the patient's current medications.    Marland Kitchen amiodarone  200 mg Oral Daily  . antiseptic oral rinse  15 mL Mouth Rinse BID  . aspirin  81 mg Oral To Major  . aspirin  81 mg Oral Daily  . budesonide-formoterol  2 puff Inhalation BID  . furosemide  80 mg Intravenous Once  . furosemide  40 mg Oral BID  . insulin aspart  0-5 Units Subcutaneous QHS  . insulin aspart  0-9 Units Subcutaneous TID WC  . insulin aspart  8 Units Subcutaneous Once  . insulin detemir  25 Units Subcutaneous QHS  . isosorbide mononitrate  60 mg Oral Daily  . LORazepam  1 mg Intravenous Once  . losartan  100 mg Oral Daily  . metoprolol succinate  25 mg Oral Daily  . NIFEdipine  30 mg Oral Daily  . nitroGLYCERIN  2-200 mcg/min Intravenous Once  .  pantoprazole  40 mg Oral Q1200  . potassium chloride SA  20 mEq Oral Daily  . sodium chloride  3 mL Intravenous Q12H  . DISCONTD: aspirin  81 mg Oral Daily  . DISCONTD: enoxaparin  30 mg Subcutaneous Q24H  . DISCONTD: losartan  100 mg Oral BH-q7a  . DISCONTD: NIFEdipine  30 mg Oral BH-q7a  . DISCONTD: warfarin  5 mg Oral Daily   Assessment/Plan: Patient Active Problem List  Diagnoses  . DM  . HYPERLIPIDEMIA  . MYALGIA  . PULMONARY HYPERTENSION, SECONDARY  . ATRIAL FIBRILLATION, PAROXYSMAL  . DIASTOLIC DYSFUNCTION  . COPD  . Chronic respiratory failure  . SKIN RASH  . Gait instability  . Fatigue  . Abdominal pain  . Skin lesion  . Shoulder pain  . Skin tear  . Breast mass  . AK (actinic keratosis)  . Hematoma  . Shortness of breath  . ARF (acute renal failure)  . HTN (hypertension)  . Leukocytosis   PLAN: negative 2 liters, cardiac enzymes are neg.   INR continues to be elevated at 4.11 BiPap d/c'd yesterday, NTG d/c'd last pm.   Maintaining SR.  Hypokalemia today will replace.  Weight is slightly higher today, but 1st weight was done after she had diuresed.  She is back on 40 mg po Lasix, she rec'd 80 IV X once in ER then 40 mg po yesterday.  Would transfer to tele today, continue to ambulate and d/c tomorrow.   Previously dry weight has been 161 in the past.Last echo was 2011.   Will repeat today, at one time EF was 25-30%, though normal in 2011.   BRADYCARDIA,  HR down to 49 at times, more frequent falls at home.? Related to brady?  Will decrease Toprol to 12.5, may need to decrease amiodarone.  Concern with decrease of meds may have recurrent a. Fib. Dr. Allyson Sabal to review.  LOS: 1 day   INGOLD,LAURA R 08/23/2011, 9:28 AM  Agree with note written by Nada Boozer RNP Runell Gess 08/30/2011 4:46 PM

## 2011-08-23 NOTE — Progress Notes (Signed)
ANTICOAGULATION CONSULT NOTE - Initial Consult  Pharmacy Consult for Warfarin Indication: atrial fibrillation  Allergies  Allergen Reactions  . Atorvastatin     REACTION: muscle aches  . Codeine Nausea And Vomiting  . Iohexol      Desc: unknown reaction; allergic to iodine and contrast   . Morphine Nausea And Vomiting  . Rosuvastatin     REACTION: muscle aches at high doses, held as of 12/2010 due to aches    Patient Measurements: Height: 5\' 1"  (154.9 cm) Weight: 164 lb 10.9 oz (74.7 kg) IBW/kg (Calculated) : 47.8   Vital Signs: Temp: 98.5 F (36.9 C) (02/26 0730) Temp src: Oral (02/26 0730) BP: 101/69 mmHg (02/26 0730) Pulse Rate: 51  (02/26 0730)  Labs:  Basename 08/23/11 0052 08/22/11 1700 08/22/11 0859  HGB 10.5* -- 11.7*  HCT 33.3* -- 37.8  PLT 240 -- 275  APTT -- -- --  LABPROT 40.4* -- 40.4*  INR 4.11* -- 4.11*  HEPARINUNFRC -- -- --  CREATININE 1.30* -- 1.24*  CKTOTAL 81 76 82  CKMB 2.3 2.3 2.3  TROPONINI <0.30 <0.30 <0.30   Estimated Creatinine Clearance: 31.4 ml/min (by C-G formula based on Cr of 1.3).  Medical History: Past Medical History  Diagnosis Date  . Hyperlipidemia   . Hypertension   . Anxiety   . GERD (gastroesophageal reflux disease)   . CAD (coronary artery disease)     Cath Aug. 2011 > medical therapy recommended  . Diastolic dysfunction     Grade II  . RBBB (right bundle branch block)     Chronic  . Paroxysmal atrial fibrillation   . Hypoxemic respiratory failure, chronic     uses 2.5 liters of oxygen with sleep  . COPD (chronic obstructive pulmonary disease)     GOLD 4 - PFT 06/08/10 FEV1 0.93 (62%), FEV1% 60, TLC 2.84 (69%0, DLCO 54%, +BD  . Secondary pulmonary hypertension   . Edema   . Angina   . Shortness of breath   . CHF (congestive heart failure)   . Diabetes mellitus     Type II - goal A1C is 8, to avoid hypoglycemia    Medications:  Home Warfarin Dose:  5 mg daily  Assessment: 78 yo F admitted 08/22/11 with  increasing SOB and weight gain.  INR still elevated today.   Home warfarin regimen 5 mg daily.  Goal of Therapy:  INR 2-3   Plan:  1. No warfarin today. 2. Daily INR.  [name] [place] 08/23/2011,9:56 AM

## 2011-08-23 NOTE — Progress Notes (Signed)
Patient ID: [name], female   DOB: [birth year], 78 y.o.   MRN: [id number]  Subjective: No events overnight. Patient denies chest pain, shortness of breath, abdominal pain.   Objective:  Vital signs in last 24 hours:  Filed Vitals:   08/23/11 1430 08/23/11 1700 08/23/11 1936 08/23/11 2039  BP:  101/84 124/36 133/65  Pulse:  62 54 57  Temp:  97.9 F (36.6 C) 98.8 F (37.1 C) 98.4 F (36.9 C)  TempSrc:  Oral Oral Oral  Resp:  18 20 20   Height:    5\' 1"  (1.549 m)  Weight:    72.5 kg (159 lb 13.3 oz)  SpO2: 97% 97% 98% 95%    Intake/Output from previous day:   Intake/Output Summary (Last 24 hours) at 08/23/11 2208 Last data filed at 08/23/11 1655  Gross per 24 hour  Intake    480 ml  Output   1350 ml  Net   -870 ml    Physical Exam: General: Alert, awake, oriented x3, in no acute distress. HEENT: No bruits, no goiter. Moist mucous membranes, no scleral icterus, no conjunctival pallor. Heart: Regular rate and rhythm, S1/S2 +, no murmurs, rubs, gallops. Lungs: Clear to auscultation bilaterally with bibasilar crackels. No wheezing, no rhonchi, no rales.  Abdomen: Soft, nontender, nondistended, positive bowel sounds. Extremities: No clubbing or cyanosis, trace bilateral pitting edema,  positive pedal pulses. Neuro: Grossly nonfocal.  Lab Results:  Basic Metabolic Panel:    Component Value Date/Time   NA 138 08/23/2011 0052   K 3.3* 08/23/2011 0052   CL 101 08/23/2011 0052   CO2 29 08/23/2011 0052   BUN 27* 08/23/2011 0052   CREATININE 1.30* 08/23/2011 0052   GLUCOSE 152* 08/23/2011 0052   CALCIUM 9.2 08/23/2011 0052   CBC:    Component Value Date/Time   WBC 9.2 08/23/2011 0052   HGB 10.5* 08/23/2011 0052   HCT 33.3* 08/23/2011 0052   PLT 240 08/23/2011 0052   MCV 95.4 08/23/2011 0052   NEUTROABS 11.6* 08/22/2011 0859   LYMPHSABS 1.3 08/22/2011 0859   MONOABS 1.2* 08/22/2011 0859   EOSABS 0.4 08/22/2011 0859   BASOSABS 0.1 08/22/2011 0859      Lab 08/23/11 0052  08/22/11 0859  WBC 9.2 14.5*  HGB 10.5* 11.7*  HCT 33.3* 37.8  PLT 240 275  MCV 95.4 97.7  MCH 30.1 30.2  MCHC 31.5 31.0  RDW 14.7 14.7  LYMPHSABS -- 1.3  MONOABS -- 1.2*  EOSABS -- 0.4  BASOSABS -- 0.1  BANDABS -- --    Lab 08/23/11 0052 08/22/11 1330 08/22/11 0859  NA 138 -- 143  K 3.3* -- 4.1  CL 101 -- 109  CO2 29 -- 25  GLUCOSE 152* -- 222*  BUN 27* -- 28*  CREATININE 1.30* -- 1.24*  CALCIUM 9.2 -- 9.5  MG -- 2.0 --    Lab 08/23/11 0052 08/22/11 0859  INR 4.11* 4.11*  PROTIME -- --   Cardiac markers:  Lab 08/23/11 0857 08/23/11 0052 08/22/11 1700  CKMB 1.9 2.3 2.3  TROPONINI <0.30 <0.30 <0.30  MYOGLOBIN -- -- --   No components found with this basename: POCBNP:3 Recent Results (from the past 240 hour(s))  MRSA PCR SCREENING     Status: Normal   Collection Time   08/22/11  8:46 PM      Component Value Range Status Comment   MRSA by PCR NEGATIVE  NEGATIVE  Final     Studies/Results: Dg Chest Portable 1 View  08/22/2011  *  RADIOLOGY REPORT*  Clinical Data: Shortness of breath, chest pain  PORTABLE CHEST - 1 VIEW  Comparison: 07/10/2011  Findings: Cardiomegaly with pulmonary vascular congestion.  Chronic interstitial markings, without frank interstitial edema. No pleural effusion or pneumothorax.  Prominence of the right hilum, likely vascular.  IMPRESSION: Cardiomegaly with pulmonary vascular congestion.  No frank interstitial edema.  Original Report Authenticated By: Charline Bills, M.D.    Medications: Scheduled Meds:   . amiodarone  200 mg Oral Daily  . antiseptic oral rinse  15 mL Mouth Rinse BID  . aspirin  81 mg Oral Daily  . budesonide-formoterol  2 puff Inhalation BID  . furosemide  40 mg Intravenous Once  . furosemide  40 mg Oral BID  . insulin aspart  0-5 Units Subcutaneous QHS  . insulin aspart  0-9 Units Subcutaneous TID WC  . insulin detemir  25 Units Subcutaneous QHS  . isosorbide mononitrate  60 mg Oral Daily  . losartan  100 mg Oral  Daily  . metoprolol succinate  12.5 mg Oral Daily  . NIFEdipine  30 mg Oral Daily  . pantoprazole  40 mg Oral Q1200  . potassium chloride SA  20 mEq Oral Daily  . potassium chloride  40 mEq Oral Once  . sodium chloride  3 mL Intravenous Q12H  . DISCONTD: metoprolol succinate  25 mg Oral Daily   Continuous Infusions:  PRN Meds:.albuterol-ipratropium, ALPRAZolam, ondansetron (ZOFRAN) IV, ondansetron  Assessment/Plan:  Principal Problem:  *Shortness of breath  - secondary to diastolic CHF exacerbation - will continue to monitor in telemetry bed  - provide Lasix for adequate diuresis  - obtain daily weights, I's and O's  - BMP in AM   Active Problems:  ARF (acute renal failure)  - likely acute on chronic renal failure  - creatinine is at pt's baseline  - BMP in AM   Leukocytosis  - unclear etiology, now resolved - pt is afebrile  - will monitor vitals per floor protocol   DM  - A1C stable - continue home medication regimen  COPD  - stable   HTN (hypertension)  - stable   DVT PROPHYLAXIS: - SCD's - plan of care and diagnosis, diagnostic studies and test results were discussed with pt and family at bedside (daughter) - pt and family verbalized understanding    LOS: 1 day   [name] 08/23/2011, 10:08 PM  [facility] Pager: [phone]

## 2011-08-23 NOTE — Progress Notes (Signed)
  Echocardiogram 2D Echocardiogram has been performed.  [name] 08/23/2011, 3:36 PM

## 2011-08-23 NOTE — Evaluation (Signed)
Physical Therapy Evaluation Patient Details Name: [name] MRN: [id number] DOB: 1928-06-15 Today's Date: 08/23/2011  Problem List:  Patient Active Problem List  Diagnoses  . DM  . HYPERLIPIDEMIA  . MYALGIA  . PULMONARY HYPERTENSION, SECONDARY  . ATRIAL FIBRILLATION, PAROXYSMAL  . DIASTOLIC DYSFUNCTION  . COPD  . Chronic respiratory failure  . SKIN RASH  . Gait instability  . Fatigue  . Abdominal pain  . Skin lesion  . Shoulder pain  . Skin tear  . Breast mass  . AK (actinic keratosis)  . Hematoma  . Shortness of breath  . ARF (acute renal failure)  . HTN (hypertension)  . Leukocytosis    Past Medical History:  Past Medical History  Diagnosis Date  . Hyperlipidemia   . Hypertension   . Anxiety   . GERD (gastroesophageal reflux disease)   . CAD (coronary artery disease)     Cath Aug. 2011 > medical therapy recommended  . Diastolic dysfunction     Grade II  . RBBB (right bundle branch block)     Chronic  . Paroxysmal atrial fibrillation   . Hypoxemic respiratory failure, chronic     uses 2.5 liters of oxygen with sleep  . COPD (chronic obstructive pulmonary disease)     GOLD 4 - PFT 06/08/10 FEV1 0.93 (62%), FEV1% 60, TLC 2.84 (69%0, DLCO 54%, +BD  . Secondary pulmonary hypertension   . Edema   . Angina   . Shortness of breath   . CHF (congestive heart failure)   . Diabetes mellitus     Type II - goal A1C is 8, to avoid hypoglycemia   Past Surgical History:  Past Surgical History  Procedure Date  . Rotator cuff repair 07/2005    right  . Abdominal hysterectomy   . Cardiac stents     PT Assessment/Plan/Recommendation PT Assessment Clinical Impression Statement: Pt presented with SOB and uses home O2. Pt oxygen desaturated to 88% on 2L with ambulation but maintained in the 90s on 3L with amb and while at rest 97-100% on 2L. Pt with sitting and standing balance deficits which pt reports as baseline and is not concerned with these. Pt educated for  fall risk and balance deficits and encouraged to always use walker at home and to ambulate acutely with staff assist. Pt reports baseline function although she feels a little weak from not eating.  PT Recommendation/Assessment: All further PT needs can be met in the next venue of care PT Problem List: Decreased balance;Decreased knowledge of use of DME;Cardiopulmonary status limiting activity PT Therapy Diagnosis : Abnormality of gait PT Recommendation Follow Up Recommendations: Home health PT Equipment Recommended: None recommended by PT PT Goals     PT Evaluation Precautions/Restrictions  Precautions Precautions: Fall Precaution Comments: O2 Prior Functioning  Home Living Lives With: Alone Receives Help From: Neighbor Type of Home: Apartment Home Layout: One level Home Access: Elevator Bathroom Shower/Tub: Tub/shower unit;Curtain Firefighter: Standard Home Adaptive Equipment: Grab bars in shower;Walker - four wheeled;Straight cane Prior Function Level of Independence: Independent with basic ADLs;Independent with transfers;Requires assistive device for independence;Independent with homemaking with ambulation Driving: No Vocation: Retired Comments: 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 She uses a cane at times inside and a rollator when out walking her dog. Pt reports only 1 fall in last yr due to waxed floor. Cognition Cognition Arousal/Alertness: Awake/alert Overall Cognitive Status: Appears within functional limits for tasks assessed Sensation/Coordination Sensation Light Touch: Appears Intact Extremity  Assessment RLE Assessment RLE Assessment: Within Functional Limits LLE Assessment LLE Assessment: Within Functional Limits Mobility (including Balance) Bed Mobility Bed Mobility: Yes Supine to Sit: 6: Modified independent (Device/Increase time) Sitting - Scoot to Edge of Bed: 6: Modified independent (Device/Increase  time) Transfers Transfers: Yes Sit to Stand: 6: Modified independent (Device/Increase time);From bed Stand to Sit: 6: Modified independent (Device/Increase time);To chair/3-in-1 Ambulation/Gait Ambulation/Gait: Yes Ambulation/Gait Assistance: 5: Supervision Ambulation/Gait Assistance Details (indicate cue type and reason): cues to step into RW and for directions back to room Ambulation Distance (Feet): 400 Feet Assistive device: Rolling walker Gait Pattern: Within Functional Limits Stairs: No  Posture/Postural Control Posture/Postural Control: No significant limitations Balance Balance Assessed: Yes Dynamic Sitting Balance Dynamic Sitting - Balance Support: Left upper extremity supported;Feet supported Dynamic Sitting - Level of Assistance: 5: Stand by assistance Dynamic Sitting - Balance Activities: Lateral lean/weight shifting (pt with Left LOB while trying to don shoe without UE support) Exercise    End of Session PT - End of Session Equipment Utilized During Treatment: Gait belt Activity Tolerance: Patient tolerated treatment well Patient left: in chair;with call bell in reach Nurse Communication: Mobility status for transfers;Mobility status for ambulation General Behavior During Session: [facility] for tasks performed Cognition: [facility] for tasks performed  Delorse Lek 08/23/2011, 9:00 AM  Toney Sang, PT [phone]

## 2011-08-24 ENCOUNTER — Encounter: Payer: Self-pay | Admitting: Cardiology

## 2011-08-24 ENCOUNTER — Inpatient Hospital Stay (HOSPITAL_COMMUNITY): Payer: Medicare Other

## 2011-08-24 ENCOUNTER — Encounter (HOSPITAL_COMMUNITY): Payer: Self-pay | Admitting: Cardiology

## 2011-08-24 DIAGNOSIS — R001 Bradycardia, unspecified: Secondary | ICD-10-CM

## 2011-08-24 DIAGNOSIS — G4710 Hypersomnia, unspecified: Secondary | ICD-10-CM

## 2011-08-24 DIAGNOSIS — I501 Left ventricular failure: Secondary | ICD-10-CM

## 2011-08-24 DIAGNOSIS — G4730 Sleep apnea, unspecified: Secondary | ICD-10-CM

## 2011-08-24 DIAGNOSIS — I2789 Other specified pulmonary heart diseases: Secondary | ICD-10-CM

## 2011-08-24 DIAGNOSIS — J438 Other emphysema: Secondary | ICD-10-CM

## 2011-08-24 HISTORY — DX: Bradycardia, unspecified: R00.1

## 2011-08-24 LAB — GLUCOSE, CAPILLARY
Glucose-Capillary: 137 mg/dL — ABNORMAL HIGH (ref 70–99)
Glucose-Capillary: 154 mg/dL — ABNORMAL HIGH (ref 70–99)
Glucose-Capillary: 282 mg/dL — ABNORMAL HIGH (ref 70–99)
Glucose-Capillary: 97 mg/dL (ref 70–99)
Glucose-Capillary: 180 mg/dL — ABNORMAL HIGH (ref 70–99)

## 2011-08-24 LAB — BASIC METABOLIC PANEL
BUN: 42 mg/dL — ABNORMAL HIGH (ref 6–23)
CO2: 27 mEq/L (ref 19–32)
Chloride: 99 mEq/L (ref 96–112)
Creatinine, Ser: 1.79 mg/dL — ABNORMAL HIGH (ref 0.50–1.10)

## 2011-08-24 LAB — CBC
HCT: 33.5 % — ABNORMAL LOW (ref 36.0–46.0)
MCV: 95.2 fL (ref 78.0–100.0)
RBC: 3.52 MIL/uL — ABNORMAL LOW (ref 3.87–5.11)
WBC: 7.5 10*3/uL (ref 4.0–10.5)

## 2011-08-24 MED ORDER — FUROSEMIDE 40 MG PO TABS
40.0000 mg | ORAL_TABLET | Freq: Every day | ORAL | Status: DC
  Filled 2011-08-24: qty 1

## 2011-08-24 MED ORDER — WARFARIN SODIUM 4 MG PO TABS
4.0000 mg | ORAL_TABLET | Freq: Once | ORAL | Status: AC
  Administered 2011-08-24: 4 mg via ORAL
  Filled 2011-08-24: qty 1

## 2011-08-24 MED ORDER — WARFARIN - PHARMACIST DOSING INPATIENT
Freq: Every day | Status: DC
Start: 2011-08-24 — End: 2011-08-26
  Filled 2011-08-24 (×3): qty 1

## 2011-08-24 NOTE — Clinical Documentation Improvement (Signed)
RESPIRATORY FAILURE DOCUMENTATION CLARIFICATION QUERY   THIS DOCUMENT IS NOT A PERMANENT PART OF THE MEDICAL RECORD  TO RESPOND TO THE THIS QUERY, FOLLOW THE INSTRUCTIONS BELOW:  1. If needed, update documentation for the patient's encounter via the notes activity.  2. Access this query again and click edit on the In [employer].  3. After updating, or not, click F2 to complete all highlighted (required) fields concerning your review. Select "additional documentation in the medical record" OR "no additional documentation provided".  4. Click Sign note button.  5. The deficiency will fall out of your In Basket *Please let us know if you are not able to complete this workflow by phone or e-mail (listed below).  Please update your documentation within the medical record to reflect your response to this query.                                                                                     08/24/11  Dear Dr. [facility],  In a better effort to capture your patient's severity of illness, reflect appropriate length of stay and utilization of resources, a review of the patient medical record has revealed the following indicators.    Based on your clinical judgment, please clarify and document in a progress note and/or discharge summary the clinical condition associated with the following supporting information:  In responding to this query please exercise your independent judgment.  The fact that a query is asked, does not imply that any particular answer is desired or expected.  May also state that the condition is resolving, resolved,etc   Possible Clinical Conditions  _______Acute on Chronic Respiratory Failure  _______Chronic Respiratory Failure  _______Acute Respiratory Distress  _______Other Condition  _______Cannot Clinically Determine    Supporting Information:  Risk Factors: Hx COPD-O2 Dependent, A/C Diastolic Heart Failure, P.  A-Fib  Signs&Symptoms: "Patient presented in the respiratory distress but alert x-ray satting 94% on 3-1/2 L of oxygen EMS had given the patient Atrovent and albuterol x2 in route patient was still having difficulty talking in complete sentences and still was breathing fast" per ED physician notes  "Acute on Chronic Respiratory failure on BiPAP" per Cardiology consult "Wheezing" "Chest Pain"  Diagnostics:  ABG's  Results for [name], [name] (MRN [id number]) as of 08/24/2011 10:59  Ref. Range 08/22/2011 10:57  pCO2 arterial Latest Range: 35.0-45.0 mmHg 41.7  pO2, Arterial Latest Range: 80.0-100.0 mmHg 89.0  Bicarbonate Latest Range: 20.0-24.0 mEq/L 23.5  TCO2 Latest Range: 0-100 mmol/L 25  Acid-base deficit Latest Range: 0.0-2.0 mmol/L 2.0  O2 Saturation No range found 96.0   Treatment: Bipap, Lasix 40mg  x 2 IVP and NTG drip in ED Lasix 40mg  po twice a day  Respiratory Treatment: Combivent Inhaler Q4prn Symbicort twice a day    You may use possible, probable, or suspect with inpatient documentation. possible, probable, suspected diagnoses MUST be documented at the time of discharge  Reviewed: Acute on chronic hypoxic respiratory failure, O2 dependent  Thank You,   Rossie Muskrat RN, BSN Clinical Documentation Specialist Pager:  [phone]    Rossie Muskrat RN, BSN Clinical Documentation Specialist Pager:  [phone] [email or web address]  Health Information Management [facility]

## 2011-08-24 NOTE — Progress Notes (Signed)
Pt. Seen and examined. Agree with the NP/PA-C note as written.  Creatinine has acutely increased overnight. Will need to decrease diuretics. Hold diuresis today. Family requests pulmonary to come by - patient is followed by Dr. Craige Cotta.  Re-assess for possible discharge tomorrow.  Chrystie Nose, MD, [facility] Attending Cardiologist [facility]
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
## 2011-08-24 NOTE — Progress Notes (Signed)
ANTICOAGULATION CONSULT NOTE - Initial Consult  Pharmacy Consult for Warfarin Indication: atrial fibrillation  Allergies  Allergen Reactions  . Atorvastatin     REACTION: muscle aches  . Codeine Nausea And Vomiting  . Iohexol      Desc: unknown reaction; allergic to iodine and contrast   . Morphine Nausea And Vomiting  . Rosuvastatin     REACTION: muscle aches at high doses, held as of 12/2010 due to aches    Patient Measurements: Height: 5\' 1"  (154.9 cm) Weight: 158 lb 3.2 oz (71.759 kg) (scale C) IBW/kg (Calculated) : 47.8   Vital Signs: Temp: 97.9 F (36.6 C) (02/27 0515) Temp src: Oral (02/27 0515) BP: 128/54 mmHg (02/27 0515) Pulse Rate: 57  (02/27 0515)  Labs:  Alvira Philips 08/24/11 0544 08/23/11 0857 08/23/11 0052 08/22/11 1700 08/22/11 0859  HGB 10.5* -- 10.5* -- --  HCT 33.5* -- 33.3* -- 37.8  PLT 239 -- 240 -- 275  APTT -- -- -- -- --  LABPROT 26.6* -- 40.4* -- 40.4*  INR 2.41* -- 4.11* -- 4.11*  HEPARINUNFRC -- -- -- -- --  CREATININE 1.79* -- 1.30* -- 1.24*  CKTOTAL -- 86 81 76 --  CKMB -- 1.9 2.3 2.3 --  TROPONINI -- <0.30 <0.30 <0.30 --   Estimated Creatinine Clearance: 22.3 ml/min (by C-G formula based on Cr of 1.79).  Medical History: Past Medical History  Diagnosis Date  . Hyperlipidemia   . Hypertension   . Anxiety   . GERD (gastroesophageal reflux disease)   . CAD (coronary artery disease)     Cath Aug. 2011 > medical therapy recommended  . Diastolic dysfunction     Grade II  . RBBB (right bundle branch block)     Chronic  . Paroxysmal atrial fibrillation   . Hypoxemic respiratory failure, chronic     uses 2.5 liters of oxygen with sleep  . COPD (chronic obstructive pulmonary disease)     GOLD 4 - PFT 06/08/10 FEV1 0.93 (62%), FEV1% 60, TLC 2.84 (69%0, DLCO 54%, +BD  . Secondary pulmonary hypertension   . Edema   . Angina   . Shortness of breath   . CHF (congestive heart failure)   . Diabetes mellitus     Type II - goal A1C is 8, to  avoid hypoglycemia  . Bradycardia 08/24/2011    Medications:  Home Warfarin Dose:  5 mg daily  Assessment: 78 yo F admitted 08/22/11 with increasing SOB and weight gain.  INR 2.41, now down within therapeutic range. INR was supratherapeutic on admision 08/22/11. Home warfarin regimen 5 mg daily.  Coumadin held 2/25 and 2/26. Last dose taken 2/24 PTA. No bleeding reported. CBC stable. On Amiodarone PTA and continued on admit.   Goal of Therapy:  INR 2-3   Plan:  1. Coumadin 4mg  x1 today.  2. Daily INR.  Arman Filter, RPh 08/24/2011,10:14 AM

## 2011-08-24 NOTE — Consult Note (Signed)
Name: [name] MRN: [id number] DOB: 11-Jan-1928    LOS: 2  PCCM CONSULTATION NOTE  History of Present Illness:  78 y/o F with PMH of HLD, HTN, GERD, Anxiety, PAF on coumadin,DM,  CAD, Pulm. HTN, OHS / probable OSA on nocturnal O2, Diastolic CHF presented to [facility] on 2/25 with complaints of 1 week hx of progressively worsening dyspnea, non-productive cough, 3 pillow orthopnea, weight gain, subjective fevers / chills.  Admitted per [facility] with [facility] consulting.  Patient improved with diuresis.  PCCM consulted per family request 2/27.    Lines / Drains: None  Cultures: 2/25 MRSA PCR>>>neg  Antibiotics: None  Tests / Events: 2/26 ECHO>>>EF 55-60%.  Left ventricle: The cavity size was normal. There was mild concentric hypertrophy. Systolic function was normal. The estimated ejection fraction was in the range of 55% to 60%. LV diastolic function cannot be assessed due to atrial fibrillation.  Past Medical History  Diagnosis Date  . Hyperlipidemia   . Hypertension   . Anxiety   . GERD (gastroesophageal reflux disease)   . CAD (coronary artery disease)     Cath Aug. 2011 > medical therapy recommended  . Diastolic dysfunction     Grade II  . RBBB (right bundle branch block)     Chronic  . Paroxysmal atrial fibrillation   . Hypoxemic respiratory failure, chronic     uses 2.5 liters of oxygen with sleep  . COPD (chronic obstructive pulmonary disease)     GOLD 4 - PFT 06/08/10 FEV1 0.93 (62%), FEV1% 60, TLC 2.84 (69%0, DLCO 54%, +BD  . Secondary pulmonary hypertension   . Edema   . Angina   . Shortness of breath   . CHF (congestive heart failure)   . Diabetes mellitus     Type II - goal A1C is 8, to avoid hypoglycemia  . Bradycardia 08/24/2011   Past Surgical History  Procedure Date  . Rotator cuff repair 07/2005    right  . Abdominal hysterectomy   . Cardiac stents    Prior to Admission medications   Medication Sig Start Date End Date Taking? Authorizing Provider    albuterol-ipratropium (COMBIVENT) 18-103 MCG/ACT inhaler Inhale 2 puffs into the lungs every 4 (four) hours as needed. For shortness of breath 08/10/11  Yes [name], MD  ALPRAZolam Prudy Feeler) 0.25 MG tablet Take 0.25 mg by mouth at bedtime as needed. And can take 1/2 to 1 tablet twice daily as needed for anxiety. 07/04/11  Yes Joaquim Nam, MD  amiodarone (PACERONE) 200 MG tablet Take 200 mg by mouth every morning.    Yes Historical Provider, MD  aspirin 81 MG tablet Take 81 mg by mouth daily.     Yes Historical Provider, MD  budesonide-formoterol (SYMBICORT) 160-4.5 MCG/ACT inhaler Inhale 2 puffs into the lungs 2 (two) times daily.     Yes Historical Provider, MD  furosemide (LASIX) 40 MG tablet Take 1 tablet (40 mg total) by mouth 2 (two) times daily. 08/08/11  Yes Joaquim Nam, MD  insulin detemir (LEVEMIR FLEXPEN) 100 UNIT/ML injection Inject 25 Units into the skin at bedtime.    Yes Historical Provider, MD  insulin regular (NOVOLIN R,HUMULIN R) 100 units/mL injection Inject 0-8 Units into the skin 3 (three) times daily before meals. Per sliding scale: 150 or less, no R, 151 or higher 4 units, > 200, 8 units 07/04/11  Yes Joaquim Nam, MD  isosorbide mononitrate (IMDUR) 60 MG 24 hr tablet Take 60 mg by mouth every morning.  07/07/11  Yes Joaquim Nam, MD  losartan (COZAAR) 100 MG tablet Take 100 mg by mouth every morning.     Yes Historical Provider, MD  metoprolol succinate (TOPROL-XL) 25 MG 24 hr tablet 1/2 tablet daily  02/01/11  Yes Joaquim Nam, MD  Multiple Vitamins-Minerals (PRESERVISION/LUTEIN) CAPS Take by mouth daily.     Yes Historical Provider, MD  NIFEdipine (PROCARDIA-XL) 30 MG (OSM) 24 hr tablet Take 30 mg by mouth every morning.     Yes Historical Provider, MD  pantoprazole (PROTONIX) 40 MG tablet Take 40 mg by mouth every morning.    Yes Historical Provider, MD  potassium chloride SA (KLOR-CON M20) 20 MEQ tablet Take 20 mEq by mouth every morning.     Yes Historical  Provider, MD  ULTIMA TEST test strip USE ONE THREE TIMES DAILY 08/08/11  Yes Joaquim Nam, MD  VALUE PLUS LANCETS THIN 26G MISC Use as directed. 12/06/10  Yes Joaquim Nam, MD  warfarin (COUMADIN) 5 MG tablet Take 5 mg by mouth daily.    Yes Historical Provider, MD   Allergies Allergies  Allergen Reactions  . Atorvastatin     REACTION: muscle aches  . Codeine Nausea And Vomiting  . Iohexol      Desc: unknown reaction; allergic to iodine and contrast   . Morphine Nausea And Vomiting  . Rosuvastatin     REACTION: muscle aches at high doses, held as of 12/2010 due to aches   Family History Family History  Problem Relation Age of Onset  . Asthma Mother   . Cancer Mother     Skin  . Heart disease Mother   . Asthma Father   . Emphysema Father   . Asthma Brother   . Asthma Brother    Social History  reports that she quit smoking about 18 years ago. Her smoking use included Cigarettes. She has a 22.5 pack-year smoking history. She has never used smokeless tobacco. She reports that she drinks alcohol. She reports that she does not use illicit drugs.  Review Of Systems:  Gen: Denies fever, chills, weight change, fatigue, night sweats HEENT: Denies blurred vision, double vision, hearing loss, tinnitus, sinus congestion, rhinorrhea, sore throat, neck stiffness, dysphagia PULM: Denies sputum production, hemoptysis, wheezing.  SEE HPI CV: Denies chest pain, paroxysmal nocturnal dyspnea, palpitations.  SEE HPI GI: Denies abdominal pain, nausea, vomiting, diarrhea, hematochezia, melena, constipation, change in bowel habits GU: Denies dysuria, hematuria, polyuria, oliguria, urethral discharge Endocrine: Denies hot or cold intolerance, polyuria, polyphagia or appetite change Derm: Denies rash, dry skin, scaling or peeling skin change Heme: Denies easy bruising, bleeding, bleeding gums Neuro: Denies headache, numbness, weakness, slurred speech, loss of memory or consciousness  Vital  Signs: Filed Vitals:   08/23/11 2039 08/24/11 0515 08/24/11 0835 08/24/11 1103  BP: 133/65 128/54  102/58  Pulse: 57 57  59  Temp: 98.4 F (36.9 C) 97.9 F (36.6 C)    TempSrc: Oral Oral    Resp: 20 18  18   Height: 5\' 1"  (1.549 m)     Weight: 159 lb 13.3 oz (72.5 kg) 158 lb 3.2 oz (71.759 kg)    SpO2: 95% 99% 90% 91%   I/O last 3 completed shifts: In: 723 [P.O.:720; I.V.:3] Out: 2200 [Urine:2200]  Physical Examination: General: obese elderly female in NAD Neuro: AAOx4, speech clear, MAE CV: s1s2 irr, no m/r/g PULM: resp's even/non-labored on ambient air, lungs bilaterally diminished, no W/R/R WU:JWJXB / soft, bsx4 active, Tol Po's Extremities:  warm/dry, no edema  Labs    CBC  Lab 08/24/11 0544 08/23/11 0052 08/22/11 0859  HGB 10.5* 10.5* 11.7*  HCT 33.5* 33.3* 37.8  WBC 7.5 9.2 14.5*  PLT 239 240 275   BMET  Lab 08/24/11 0544 08/23/11 0052 08/22/11 1330 08/22/11 0859  NA 138 138 -- 143  K 4.2 3.3* -- --  CL 99 101 -- 109  CO2 27 29 -- 25  GLUCOSE 165* 152* -- 222*  BUN 42* 27* -- 28*  CREATININE 1.79* 1.30* -- 1.24*  CALCIUM 9.4 9.2 -- 9.5  MG -- -- 2.0 --  PHOS -- -- 2.5 --    Lab 08/24/11 0544 08/23/11 0052 08/22/11 0859  INR 2.41* 4.11* 4.11*    Lab 08/22/11 1057  PHART 7.359  PCO2ART 41.7  PO2ART 89.0  HCO3 23.5  TCO2 25  O2SAT 96.0   Radiology: 2/27 CXR>>>Improvement in pulmonary vascular congestion.  Assessment and Plan:  Acute dyspnea secondary to congestive heart failure (diastolic dysfunction), now resolved, doing well on ambient air Chronic obstructive pulmonary disease, mild, without exacerbation Obesity hypoventilation syndrome with probable obstructive sleep apnea Pulmonary hypertension secondary to COPD / CHF No evidence of acute pulmonary infection  -->  Agree with holding diuresis as now breathing is at baseline and elevated creatinine -->  Continue preadmission inhales -->  Has an appointment with Dr. Craige Cotta early  March  Canary Brim, NP-C [facility] Pgr: [phone]  08/24/2011, 2:02 PM  Patient examined, records reviewed, assessment and plan as above.  Orlean Bradford, M.D. Pulmonary and Critical Care Medicine [facility] Cell: [phone] Pager: [phone]

## 2011-08-24 NOTE — Plan of Care (Signed)
Problem: Phase II Progression Outcomes Goal: Walk in hall or up in chair TID Outcome: Completed/Met Date Met:  08/24/11 Pt ambulated in hall with walker and daughter.

## 2011-08-24 NOTE — Progress Notes (Signed)
Pt evaluated for long term disease management services with [facility] program as a benefit of [employer]. RN case manager will do a post d/c transition of care call and monthly home visits for education and continued assessments of COPD, CHF, HTN, CAD, IDDM and other needs. Brooke Bonito C. Janeth Terry, RN, MS, CCM [facility] Hospital Liaison [facility] [phone]

## 2011-08-24 NOTE — Progress Notes (Signed)
Patient ID: [name]    FAO:[id number]    DOB: 12-25-1927    DOA: 08/22/2011  PCP: Crawford Givens, MD, MD  Subjective: Improving  Objective: Weight change: -2.1 kg (-4 lb 10.1 oz)  Intake/Output Summary (Last 24 hours) at 08/24/11 1803 Last data filed at 08/24/11 1749  Gross per 24 hour  Intake    783 ml  Output    900 ml  Net   -117 ml   Blood pressure 101/88, pulse 53, temperature 97.2 F (36.2 C), temperature source Oral, resp. rate 18, height 5\' 1"  (1.549 m), weight 71.759 kg (158 lb 3.2 oz), SpO2 92.00%.  Physical Exam: General: Alert and awake, oriented x3, not in any acute distress. HEENT: anicteric sclera, pupils reactive to light and accommodation, EOMI CVS: S1-S2 clear, no murmur rubs or gallops Chest: clear to auscultation bilaterally, no wheezing, rales or rhonchi Abdomen: soft nontender, nondistended, normal bowel sounds, no organomegaly Extremities: no cyanosis, clubbing or edema noted bilaterally Neuro: Cranial nerves II-XII intact, no focal neurological deficits  Lab Results: Basic Metabolic Panel:  Lab 08/24/11 6962 08/23/11 0052 08/22/11 1330  NA 138 138 --  K 4.2 3.3* --  CL 99 101 --  CO2 27 29 --  GLUCOSE 165* 152* --  BUN 42* 27* --  CREATININE 1.79* 1.30* --  CALCIUM 9.4 9.2 --  MG -- -- 2.0  PHOS -- -- 2.5   Liver Function Tests:  Lab 08/23/11 1041  AST 13  ALT 18  ALKPHOS 105  BILITOT 0.8  PROT 7.3  ALBUMIN 3.2*   CBC:  Lab 08/24/11 0544 08/23/11 0052 08/22/11 0859  WBC 7.5 9.2 --  NEUTROABS -- -- 11.6*  HGB 10.5* 10.5* --  HCT 33.5* 33.3* --  MCV 95.2 95.4 --  PLT 239 240 --   Cardiac Enzymes:  Lab 08/23/11 0857 08/23/11 0052 08/22/11 1700  CKTOTAL 86 81 76  CKMB 1.9 2.3 2.3  CKMBINDEX -- -- --  TROPONINI <0.30 <0.30 <0.30   BNP: No components found with this basename: POCBNP:2 CBG:  Lab 08/24/11 1603 08/24/11 1110 08/24/11 0730 08/23/11 2059 08/23/11 1710  GLUCAP 97 282* 154* 137* 173*     Micro  Results: Recent Results (from the past 240 hour(s))  MRSA PCR SCREENING     Status: Normal   Collection Time   08/22/11  8:46 PM      Component Value Range Status Comment   MRSA by PCR NEGATIVE  NEGATIVE  Final     Studies/Results: Dg Chest Port 1 View  08/24/2011  *RADIOLOGY REPORT*  Clinical Data: Weight gain, fluid retention, diabetes  PORTABLE CHEST - 1 VIEW  Comparison: Portable chest x-ray of 08/22/2011  Findings: There has been improvement in the degree of pulmonary vascular congestion.  Cardiomegaly is stable.  No effusion is seen.  IMPRESSION: Improvement in pulmonary vascular congestion.  Original Report Authenticated By: Juline Patch, M.D.   Dg Chest Portable 1 View  08/22/2011  *RADIOLOGY REPORT*  Clinical Data: Shortness of breath, chest pain  PORTABLE CHEST - 1 VIEW  Comparison: 07/10/2011  Findings: Cardiomegaly with pulmonary vascular congestion.  Chronic interstitial markings, without frank interstitial edema. No pleural effusion or pneumothorax.  Prominence of the right hilum, likely vascular.  IMPRESSION: Cardiomegaly with pulmonary vascular congestion.  No frank interstitial edema.  Original Report Authenticated By: Charline Bills, M.D.    Medications: Scheduled Meds:   . amiodarone  200 mg Oral Daily  . antiseptic oral rinse  15 mL Mouth  Rinse BID  . aspirin  81 mg Oral Daily  . budesonide-formoterol  2 puff Inhalation BID  . furosemide  40 mg Oral Daily  . insulin aspart  0-5 Units Subcutaneous QHS  . insulin aspart  0-9 Units Subcutaneous TID WC  . insulin detemir  25 Units Subcutaneous QHS  . isosorbide mononitrate  60 mg Oral Daily  . losartan  100 mg Oral Daily  . metoprolol succinate  12.5 mg Oral Daily  . NIFEdipine  30 mg Oral Daily  . pantoprazole  40 mg Oral Q1200  . potassium chloride SA  20 mEq Oral Daily  . sodium chloride  3 mL Intravenous Q12H  . warfarin  4 mg Oral ONCE-1800  . Warfarin - Pharmacist Dosing Inpatient   Does not apply q1800  .  DISCONTD: furosemide  40 mg Oral BID   Continuous Infusions:    Assessment/Plan:  Principal Problem:  *Shortness of breath: secondary to diastolic CHF exacerbation  - Hold Lasix today secondary to worsening creatinine function, continue daily weights, I's and O's  - BMP in AM   Active Problems:  ARF (acute renal failure): likely acute on chronic renal failure secondary to Lasix - Lasix held, BMP in AM   Leukocytosis  - unclear etiology, now resolved   DM  - A1C stable  - continue home medication regimen   COPD  - stable, appreciate pulmonology recommendations, outpatient followup for Dr. Craige Cotta in early March   HTN (hypertension)  - stable   DVT PROPHYLAXIS:  - SCD's  Code Status: Full Code  Disposition: Hopefully tomorrow, if okay with cardiology and creatinine function improving   LOS: 2 days   Borden Thune M.D. [facility] 08/24/2011, 6:03 PM Pager: [phone]

## 2011-08-24 NOTE — Plan of Care (Signed)
Problem: Phase I Progression Outcomes Goal: EF % per last Echo/documented,Core Reminder form on chart Outcome: Completed/Met Date Met:  08/24/11 55-60%

## 2011-08-25 DIAGNOSIS — Z7901 Long term (current) use of anticoagulants: Secondary | ICD-10-CM

## 2011-08-25 DIAGNOSIS — I2510 Atherosclerotic heart disease of native coronary artery without angina pectoris: Secondary | ICD-10-CM | POA: Diagnosis present

## 2011-08-25 DIAGNOSIS — I4510 Unspecified right bundle-branch block: Secondary | ICD-10-CM | POA: Diagnosis present

## 2011-08-25 DIAGNOSIS — I350 Nonrheumatic aortic (valve) stenosis: Secondary | ICD-10-CM | POA: Diagnosis present

## 2011-08-25 DIAGNOSIS — I5033 Acute on chronic diastolic (congestive) heart failure: Secondary | ICD-10-CM | POA: Diagnosis present

## 2011-08-25 DIAGNOSIS — I739 Peripheral vascular disease, unspecified: Secondary | ICD-10-CM | POA: Diagnosis present

## 2011-08-25 LAB — BASIC METABOLIC PANEL
BUN: 53 mg/dL — ABNORMAL HIGH (ref 6–23)
CO2: 25 mEq/L (ref 19–32)
CO2: 29 mEq/L (ref 19–32)
Calcium: 9.4 mg/dL (ref 8.4–10.5)
Chloride: 99 mEq/L (ref 96–112)
Chloride: 97 mEq/L (ref 96–112)
Creatinine, Ser: 2.13 mg/dL — ABNORMAL HIGH (ref 0.50–1.10)
GFR calc Af Amer: 24 mL/min — ABNORMAL LOW (ref >90)
GFR calc non Af Amer: 21 mL/min — ABNORMAL LOW (ref >90)
Glucose, Bld: 198 mg/dL — ABNORMAL HIGH (ref 70–99)
Potassium: 4.3 mEq/L (ref 3.5–5.1)
Sodium: 136 mEq/L (ref 135–145)
Sodium: 135 mEq/L (ref 135–145)

## 2011-08-25 LAB — PROTIME-INR: INR: 1.63 — ABNORMAL HIGH (ref 0.00–1.49)

## 2011-08-25 LAB — GLUCOSE, CAPILLARY
Glucose-Capillary: 223 mg/dL — ABNORMAL HIGH (ref 70–99)
Glucose-Capillary: 235 mg/dL — ABNORMAL HIGH (ref 70–99)

## 2011-08-25 MED ORDER — WARFARIN SODIUM 5 MG PO TABS
5.0000 mg | ORAL_TABLET | Freq: Once | ORAL | Status: AC
  Administered 2011-08-25: 5 mg via ORAL
  Filled 2011-08-25 (×2): qty 1

## 2011-08-25 NOTE — Progress Notes (Signed)
Subjective:  SOB improved, she says she weighed 174lbs at home,(164lbs on adm 08/22/11) now down to 159lbs.  Objective:  Vital Signs in the last 24 hours: Temp:  [97.2 F (36.2 C)-98 F (36.7 C)] 98 F (36.7 C) (02/28 0454) Pulse Rate:  [53-67] 67  (02/28 0454) Resp:  [18-20] 18  (02/28 0454) BP: (101-144)/(51-88) 144/51 mmHg (02/28 0454) SpO2:  [90 %-98 %] 98 % (02/28 0454) Weight:  [72.122 kg (159 lb)] 72.122 kg (159 lb) (02/28 0454)  Intake/Output from previous day:  Intake/Output Summary (Last 24 hours) at 08/25/11 0823 Last data filed at 08/25/11 0500  Gross per 24 hour  Intake    783 ml  Output   1250 ml  Net   -467 ml    Physical Exam: General appearance: alert, cooperative and no distress Lungs: clear to auscultation bilaterally Heart: regular rate and rhythm and soft systolic murmur LSB Extrem: No edema   Rate: 66  Rhythm: normal sinus rhythm  Lab Results:  Basename 08/24/11 0544 08/23/11 0052  WBC 7.5 9.2  HGB 10.5* 10.5*  PLT 239 240    Basename 08/25/11 0608 08/24/11 0544  NA 136 138  K 4.1 4.2  CL 99 99  CO2 25 27  GLUCOSE 176* 165*  BUN 53* 42*  CREATININE 2.07* 1.79*    Basename 08/23/11 0857 08/23/11 0052  TROPONINI <0.30 <0.30   Hepatic Function Panel  Basename 08/23/11 1041  PROT 7.3  ALBUMIN 3.2*  AST 13  ALT 18  ALKPHOS 105  BILITOT 0.8  BILIDIR 0.2  IBILI 0.6   No results found for this basename: CHOL in the last 72 hours  Basename 08/25/11 1610  INR 1.63*    Imaging: Dg Chest Port 1 View  08/24/2011  *RADIOLOGY REPORT*  Clinical Data: Weight gain, fluid retention, diabetes  PORTABLE CHEST - 1 VIEW  Comparison: Portable chest x-ray of 08/22/2011  Findings: There has been improvement in the degree of pulmonary vascular congestion.  Cardiomegaly is stable.  No effusion is seen.  IMPRESSION: Improvement in pulmonary vascular congestion.  Original Report Authenticated By: Juline Patch, M.D.    Cardiac  Studies:  Assessment/Plan:   Principal Problem:  *Shortness of breath  Active Problems:  Acute on chronic diastolic congestive heart failure  PAF, recurrance this admission, on chronic Amio/ Coumadin  Chronic renal insufficiency, Cr 1.9 in Jan 2013  CAD, remote RCA PCI, subsequently noted to be occluded, last cath 8/11  DM, IDDM  HYPERLIPIDEMIA, statin intol  PULMONARY HYPERTENSION, SECONDARY, mod-severe in past  COPD  RBBB  Mild aortic stenosis, by 2D this adm  PVD (peripheral vascular disease), moderate carotid and LSCA disease  Chronic anticoagulation  HTN (hypertension)  Plan- Sharp rise in BUN/CR with diuresis. Her Cr on adm was 1.3, (1.9 IN jAN 2013). Will stop ARB and check office records for past SCr. Hold diuretic this am.    Corine Shelter PA-C 08/25/2011, 8:23 AM

## 2011-08-25 NOTE — Progress Notes (Signed)
Patient ID: [name]    ZOX:[id number]    DOB: 15-Dec-1927    DOA: 08/22/2011  PCP: Crawford Givens, MD, MD  Subjective: Improving SOB  Objective: Weight change: -0.378 kg (-13.3 oz)  Intake/Output Summary (Last 24 hours) at 08/25/11 1600 Last data filed at 08/25/11 1503  Gross per 24 hour  Intake   1206 ml  Output   1300 ml  Net    -94 ml   Blood pressure 106/83, pulse 50, temperature 97.7 F (36.5 C), temperature source Oral, resp. rate 18, height 5\' 1"  (1.549 m), weight 72.122 kg (159 lb), SpO2 94.00%.  Physical Exam: General: Alert and awake, oriented x3, not in any acute distress. HEENT: anicteric sclera, pupils reactive to light and accommodation, EOMI CVS: S1-S2 clear, no murmur rubs or gallops Chest: clear to auscultation bilaterally, no wheezing, rales or rhonchi Abdomen: soft nontender, nondistended, normal bowel sounds, no organomegaly Extremities: no cyanosis, clubbing or edema noted bilaterally Neuro: Cranial nerves II-XII intact, no focal neurological deficits  Lab Results: Basic Metabolic Panel:  Lab 08/25/11 8119 08/25/11 0608 08/22/11 1330  NA 135 136 --  K 4.3 4.1 --  CL 97 99 --  CO2 29 25 --  GLUCOSE 198* 176* --  BUN 53* 53* --  CREATININE 2.13* 2.07* --  CALCIUM 9.4 9.3 --  MG -- -- 2.0  PHOS -- -- 2.5   Liver Function Tests:  Lab 08/23/11 1041  AST 13  ALT 18  ALKPHOS 105  BILITOT 0.8  PROT 7.3  ALBUMIN 3.2*   CBC:  Lab 08/24/11 0544 08/23/11 0052 08/22/11 0859  WBC 7.5 9.2 --  NEUTROABS -- -- 11.6*  HGB 10.5* 10.5* --  HCT 33.5* 33.3* --  MCV 95.2 95.4 --  PLT 239 240 --   Cardiac Enzymes:  Lab 08/23/11 0857 08/23/11 0052 08/22/11 1700  CKTOTAL 86 81 76  CKMB 1.9 2.3 2.3  CKMBINDEX -- -- --  TROPONINI <0.30 <0.30 <0.30   BNP: No components found with this basename: POCBNP:2 CBG:  Lab 08/25/11 1052 08/25/11 0556 08/24/11 2230 08/24/11 1603 08/24/11 1110  GLUCAP 223* 165* 180* 97 282*     Micro Results: Recent  Results (from the past 240 hour(s))  MRSA PCR SCREENING     Status: Normal   Collection Time   08/22/11  8:46 PM      Component Value Range Status Comment   MRSA by PCR NEGATIVE  NEGATIVE  Final     Studies/Results: Dg Chest Port 1 View  08/24/2011  *RADIOLOGY REPORT*  Clinical Data: Weight gain, fluid retention, diabetes  PORTABLE CHEST - 1 VIEW  Comparison: Portable chest x-ray of 08/22/2011  Findings: There has been improvement in the degree of pulmonary vascular congestion.  Cardiomegaly is stable.  No effusion is seen.  IMPRESSION: Improvement in pulmonary vascular congestion.  Original Report Authenticated By: Juline Patch, M.D.   Dg Chest Portable 1 View  08/22/2011  *RADIOLOGY REPORT*  Clinical Data: Shortness of breath, chest pain  PORTABLE CHEST - 1 VIEW  Comparison: 07/10/2011  Findings: Cardiomegaly with pulmonary vascular congestion.  Chronic interstitial markings, without frank interstitial edema. No pleural effusion or pneumothorax.  Prominence of the right hilum, likely vascular.  IMPRESSION: Cardiomegaly with pulmonary vascular congestion.  No frank interstitial edema.  Original Report Authenticated By: Charline Bills, M.D.    Medications: Scheduled Meds:    . amiodarone  200 mg Oral Daily  . antiseptic oral rinse  15 mL Mouth Rinse BID  .  aspirin  81 mg Oral Daily  . budesonide-formoterol  2 puff Inhalation BID  . insulin aspart  0-5 Units Subcutaneous QHS  . insulin aspart  0-9 Units Subcutaneous TID WC  . insulin detemir  25 Units Subcutaneous QHS  . isosorbide mononitrate  60 mg Oral Daily  . metoprolol succinate  12.5 mg Oral Daily  . NIFEdipine  30 mg Oral Daily  . pantoprazole  40 mg Oral Q1200  . potassium chloride SA  20 mEq Oral Daily  . sodium chloride  3 mL Intravenous Q12H  . warfarin  4 mg Oral ONCE-1800  . warfarin  5 mg Oral ONCE-1800  . Warfarin - Pharmacist Dosing Inpatient   Does not apply q1800  . DISCONTD: furosemide  40 mg Oral Daily  .  DISCONTD: losartan  100 mg Oral Daily   Continuous Infusions:    Assessment/Plan:  Principal Problem:  *Shortness of breath: secondary to diastolic CHF exacerbation  - Cont to hold Lasix, dc'ed losartan secondary to worsening creatinine function, continue daily weights, I's and O's  - recheck BMP in AM  - if Cr continues to worsen, will discuss with renal in AM  Active Problems:  ARF (acute renal failure): likely acute on chronic renal failure secondary to Lasix - Lasix held, BMP in AM   Leukocytosis  - unclear etiology, now resolved   DM  - A1C stable  - continue home medication regimen   COPD  - stable, appreciate pulmonology recommendations, outpatient followup for Dr. Craige Cotta in early March   HTN (hypertension)  - stable   DVT PROPHYLAXIS:  - SCD's  Code Status: Full Code  Disposition: not medically ready   LOS: 3 days   Hammad Finkler M.D. [facility] 08/25/2011, 4:00 PM Pager: [phone]

## 2011-08-25 NOTE — Progress Notes (Signed)
Pt. Seen and examined. Agree with the NP/PA-C note as written. Creatinine continues to rise today. Holding lasix and ACE/ARB. Encouraged po intake today. No nephrotoxic meds. Will re-check BMP later today. May need to involve renal. Hold discharge until creatinine improves.  Chrystie Nose, MD, [facility] Attending Cardiologist [facility]

## 2011-08-25 NOTE — Progress Notes (Signed)
ANTICOAGULATION CONSULT NOTE - Follow Up Consult  Pharmacy Consult for Coumadin  Indication: atrial fibrillation  Allergies  Allergen Reactions  . Atorvastatin     REACTION: muscle aches  . Codeine Nausea And Vomiting  . Iohexol      Desc: unknown reaction; allergic to iodine and contrast   . Morphine Nausea And Vomiting  . Rosuvastatin     REACTION: muscle aches at high doses, held as of 12/2010 due to aches    Patient Measurements: Height: 5\' 1"  (154.9 cm) Weight: 159 lb (72.122 kg) (scale c) IBW/kg (Calculated) : 47.8   Vital Signs: Temp: 98.1 F (36.7 C) (02/28 0959) Temp src: Oral (02/28 0959) BP: 113/48 mmHg (02/28 0959) Pulse Rate: 55  (02/28 0959)  Labs:  Alvira Philips 08/25/11 0454 08/24/11 0544 08/23/11 0857 08/23/11 0052 08/22/11 1700  HGB -- 10.5* -- 10.5* --  HCT -- 33.5* -- 33.3* --  PLT -- 239 -- 240 --  APTT -- -- -- -- --  LABPROT 19.6* 26.6* -- 40.4* --  INR 1.63* 2.41* -- 4.11* --  HEPARINUNFRC -- -- -- -- --  CREATININE 2.07* 1.79* -- 1.30* --  CKTOTAL -- -- 86 81 76  CKMB -- -- 1.9 2.3 2.3  TROPONINI -- -- <0.30 <0.30 <0.30   Estimated Creatinine Clearance: 19.3 ml/min (by C-G formula based on Cr of 2.07).   Medications:  Scheduled:    . amiodarone  200 mg Oral Daily  . antiseptic oral rinse  15 mL Mouth Rinse BID  . aspirin  81 mg Oral Daily  . budesonide-formoterol  2 puff Inhalation BID  . insulin aspart  0-5 Units Subcutaneous QHS  . insulin aspart  0-9 Units Subcutaneous TID WC  . insulin detemir  25 Units Subcutaneous QHS  . isosorbide mononitrate  60 mg Oral Daily  . metoprolol succinate  12.5 mg Oral Daily  . NIFEdipine  30 mg Oral Daily  . pantoprazole  40 mg Oral Q1200  . potassium chloride SA  20 mEq Oral Daily  . sodium chloride  3 mL Intravenous Q12H  . warfarin  4 mg Oral ONCE-1800  . Warfarin - Pharmacist Dosing Inpatient   Does not apply q1800  . DISCONTD: furosemide  40 mg Oral BID  . DISCONTD: furosemide  40 mg Oral  Daily  . DISCONTD: losartan  100 mg Oral Daily    Assessment: 78 yo F on coumadin prior to admission for afib. INR subtherapeutic(1.63 <--2.41),  Home warfarin regimen 5 mg daily. Coumadin held 2/25 and 2/26 d/t supratherapeutic INR. No bleeding reported. On Amiodarone PTA and continued on admit.   Goal of Therapy:  INR 2-3   Plan:  - Coumadin 5mg  po x 1 - f/u INR tomorrow Am  Riki Rusk 08/25/2011,10:48 AM

## 2011-08-26 LAB — BASIC METABOLIC PANEL
BUN: 51 mg/dL — ABNORMAL HIGH (ref 6–23)
CO2: 28 mEq/L (ref 19–32)
Calcium: 9.6 mg/dL (ref 8.4–10.5)
Chloride: 103 mEq/L (ref 96–112)
Creatinine, Ser: 1.77 mg/dL — ABNORMAL HIGH (ref 0.50–1.10)
GFR calc Af Amer: 30 mL/min — ABNORMAL LOW (ref >90)
GFR calc non Af Amer: 26 mL/min — ABNORMAL LOW (ref >90)
Glucose, Bld: 187 mg/dL — ABNORMAL HIGH (ref 70–99)
Potassium: 4.6 mEq/L (ref 3.5–5.1)
Sodium: 139 mEq/L (ref 135–145)

## 2011-08-26 LAB — GLUCOSE, CAPILLARY
Glucose-Capillary: 166 mg/dL — ABNORMAL HIGH (ref 70–99)
Glucose-Capillary: 285 mg/dL — ABNORMAL HIGH (ref 70–99)

## 2011-08-26 MED ORDER — FUROSEMIDE 40 MG PO TABS: 40.0000 mg | ORAL_TABLET | Freq: Every day | ORAL | Status: DC

## 2011-08-26 MED ORDER — WARFARIN SODIUM 5 MG PO TABS
5.0000 mg | ORAL_TABLET | Freq: Once | ORAL | Status: DC
  Filled 2011-08-26: qty 1

## 2011-08-26 NOTE — Progress Notes (Signed)
Pt was given DC instructions and verbalized understanding.  Pt dc home via wc.

## 2011-08-26 NOTE — Progress Notes (Signed)
ANTICOAGULATION CONSULT NOTE - Follow Up Consult  Pharmacy Consult for Coumadin  Indication: atrial fibrillation  Allergies  Allergen Reactions  . Atorvastatin     REACTION: muscle aches  . Codeine Nausea And Vomiting  . Iohexol      Desc: unknown reaction; allergic to iodine and contrast   . Morphine Nausea And Vomiting  . Rosuvastatin     REACTION: muscle aches at high doses, held as of 12/2010 due to aches    Patient Measurements: Height: 5\' 1"  (154.9 cm) Weight: 159 lb 6.3 oz (72.3 kg) (scale c) IBW/kg (Calculated) : 47.8   Vital Signs: Temp: 97.6 F (36.4 C) (03/01 0700) BP: 168/64 mmHg (03/01 0942) Pulse Rate: 56  (03/01 0942)  Labs:  Basename 08/26/11 0700 08/25/11 1441 08/25/11 0608 08/24/11 0544  HGB -- -- -- 10.5*  HCT -- -- -- 33.5*  PLT -- -- -- 239  APTT -- -- -- --  LABPROT 20.3* -- 19.6* 26.6*  INR 1.70* -- 1.63* 2.41*  HEPARINUNFRC -- -- -- --  CREATININE 1.77* 2.13* 2.07* --  CKTOTAL -- -- -- --  CKMB -- -- -- --  TROPONINI -- -- -- --   Estimated Creatinine Clearance: 22.7 ml/min (by C-G formula based on Cr of 1.77).   Medications:  Scheduled:     . amiodarone  200 mg Oral Daily  . antiseptic oral rinse  15 mL Mouth Rinse BID  . aspirin  81 mg Oral Daily  . budesonide-formoterol  2 puff Inhalation BID  . insulin aspart  0-5 Units Subcutaneous QHS  . insulin aspart  0-9 Units Subcutaneous TID WC  . insulin detemir  25 Units Subcutaneous QHS  . isosorbide mononitrate  60 mg Oral Daily  . metoprolol succinate  12.5 mg Oral Daily  . NIFEdipine  30 mg Oral Daily  . pantoprazole  40 mg Oral Q1200  . sodium chloride  3 mL Intravenous Q12H  . warfarin  5 mg Oral ONCE-1800  . Warfarin - Pharmacist Dosing Inpatient   Does not apply q1800  . DISCONTD: potassium chloride SA  20 mEq Oral Daily    Assessment: SOB and weight gain. 78 yo F on coumadin prior to admission for afib. INR subtherapeutic(1.63 <--2.41),  Home warfarin regimen 5 mg daily.  Coumadin held 2/25 and 2/26 d/t supratherapeutic INR. No bleeding reported. On Amiodarone PTA and continued on admit. INR now 1.7.   Goal of Therapy:  INR 2-3   Plan:  - Coumadin 5mg  po x 1 again today.  Merilynn Finland, [employer] 08/26/2011,10:31 AM

## 2011-08-26 NOTE — Discharge Summary (Signed)
Physician Discharge Summary  Patient ID: [name] MRN: [id number] DOB/AGE: [birth year] 76 y.o.  Admit date: 08/22/2011 Discharge date: 08/26/2011  Primary Care Physician:  Crawford Givens, MD, MD  Discharge Diagnoses:     .DM .Shortness of breath due to acute CHF exacerbation  .Acute renal insufficiency, SCr peaked 2.17 after diuresis, ARB stopped .HTN (hypertension) .Leukocytosis .COPD, chronic O2 .Acute on chronic diastolic congestive heart failure .PAF, recurrance this admission, on chronic Amio/ Coumadin .PULMONARY HYPERTENSION, SECONDARY, mod-severe in past .HYPERLIPIDEMIA, statin intolerant .RBBB .Mild aortic stenosis, by 2D echo this adm .CAD, remote RCA PCI, subsequently noted to be occluded, last cath 8/11 .PVD (peripheral vascular disease), moderate carotid and LSCA disease  Consults:  Cardiology, [facility]   Discharge Medications: Medication List  As of 08/26/2011  7:00 PM   STOP taking these medications         losartan 100 MG tablet         TAKE these medications         albuterol-ipratropium 18-103 MCG/ACT inhaler   Commonly known as: COMBIVENT   Inhale 2 puffs into the lungs every 4 (four) hours as needed. For shortness of breath      ALPRAZolam 0.25 MG tablet   Commonly known as: XANAX   Take 0.25 mg by mouth at bedtime as needed. And can take 1/2 to 1 tablet twice daily as needed for anxiety.      amiodarone 200 MG tablet   Commonly known as: PACERONE   Take 200 mg by mouth every morning.      aspirin 81 MG tablet   Take 81 mg by mouth daily.      furosemide 40 MG tablet   Commonly known as: LASIX   Take 1 tablet (40 mg total) by mouth daily.      insulin regular 100 units/mL injection   Commonly known as: NOVOLIN R,HUMULIN R   Inject 0-8 Units into the skin 3 (three) times daily before meals. Per sliding scale: 150 or less, no R, 151 or higher 4 units, > 200, 8 units      isosorbide mononitrate 60 MG 24 hr tablet   Commonly known as: IMDUR   Take 60 mg by mouth every morning.      KLOR-CON M20 20 MEQ tablet   Generic drug: potassium chloride SA   Take 20 mEq by mouth every morning.      LEVEMIR FLEXPEN 100 UNIT/ML injection   Generic drug: insulin detemir   Inject 25 Units into the skin at bedtime.      metoprolol succinate 25 MG 24 hr tablet   Commonly known as: TOPROL-XL   1/2 tablet daily      NIFEdipine 30 MG (OSM) 24 hr tablet   Commonly known as: PROCARDIA-XL   Take 30 mg by mouth every morning.      PreserVision/Lutein Caps   Take by mouth daily.      PROTONIX 40 MG tablet   Generic drug: pantoprazole   Take 40 mg by mouth every morning.      SYMBICORT 160-4.5 MCG/ACT inhaler   Generic drug: budesonide-formoterol   Inhale 2 puffs into the lungs 2 (two) times daily.      ULTIMA TEST test strip   Generic drug: glucose blood   USE ONE THREE TIMES DAILY      VALUE PLUS LANCETS THIN 26G Misc   Use as directed.      warfarin 5 MG tablet   Commonly known as: COUMADIN  Take 5 mg by mouth daily.             Brief H and P: For complete details please refer to admission H and P, but in brief the patient is an 78 year old female with history of CHF, CAD, COPD presented with progressively worsening shortness of breath that initially started about one week prior to permission and was associated with nonproductive cough, 3 pillow orthopnea and subjective fevers and chills. Patient also reported weight gain off over 10 pounds over the past month.  Hospital Course:   Shortness of breath: Multifactorial secondary to diastolic CHF exacerbation, pulmonary hypertension, COPD. Patient was admitted to the hospitalist service to SDU, she was ruled out for acute ACS. Patient was started on IV diurese. Cardiology was consulted. Due to sharp rise in creatinine to 2.13, Lasix was held, losartan was discontinued. Creatinine function improved to 1.7 at the time of discharge. She was cleared by cardiology service for  discharge, she'll follow up with Dr. Allyson Sabal. Lasix was reduced to 40 mg daily.  ARF (acute renal failure): likely acute on chronic renal failure secondary to Lasix    Leukocytosis: unclear etiology, resolved   DM: Somewhat uncontrolled, A1c 7.3   COPD  - stable, pulmonology was consulted as patient does have a history of COPD with moderate to severe pulmonary hypertension, patient has outpatient followup with Dr. Craige Cotta in early March   HTN (hypertension): Remained stable    Day of Discharge BP 168/64  Pulse 56  Temp(Src) 97.6 F (36.4 C) (Oral)  Resp 16  Ht 5\' 1"  (1.549 m)  Wt 72.3 kg (159 lb 6.3 oz)  BMI 30.12 kg/m2  SpO2 99%  Physical Exam: General: Alert and awake oriented x3 not in any acute distress. HEENT: anicteric sclera, pupils reactive to light and accommodation CVS: S1-S2 clear no murmur rubs or gallops Chest: clear to auscultation bilaterally, no wheezing rales or rhonchi Abdomen: soft nontender, nondistended, normal bowel sounds, no organomegaly Extremities: no cyanosis, clubbing or edema noted bilaterally Neuro: Cranial nerves II-XII intact, no focal neurological deficits   The results of significant diagnostics from this hospitalization (including imaging, microbiology, ancillary and laboratory) are listed below for reference.    LAB RESULTS: Basic Metabolic Panel:  Lab 08/26/11 4098 08/25/11 1441 08/22/11 1330  NA 139 135 --  K 4.6 4.3 --  CL 103 97 --  CO2 28 29 --  GLUCOSE 187* 198* --  BUN 51* 53* --  CREATININE 1.77* 2.13* --  CALCIUM 9.6 9.4 --  MG -- -- 2.0  PHOS -- -- 2.5   Liver Function Tests:  Lab 08/23/11 1041  AST 13  ALT 18  ALKPHOS 105  BILITOT 0.8  PROT 7.3  ALBUMIN 3.2*   CBC:  Lab 08/24/11 0544 08/23/11 0052 08/22/11 0859  WBC 7.5 9.2 --  NEUTROABS -- -- 11.6*  HGB 10.5* 10.5* --  HCT 33.5* 33.3* --  MCV 95.2 -- --  PLT 239 240 --   Cardiac Enzymes:  Lab 08/23/11 0857 08/23/11 0052  CKTOTAL 86 81  CKMB 1.9 2.3    CKMBINDEX -- --  TROPONINI <0.30 <0.30   CBG:  Lab 08/26/11 1050 08/26/11 0641  GLUCAP 285* 166*    Significant Diagnostic Studies:  Dg Chest Portable 1 View  08/22/2011  *RADIOLOGY REPORT*  Clinical Data: Shortness of breath, chest pain  PORTABLE CHEST - 1 VIEW  Comparison: 07/10/2011  Findings: Cardiomegaly with pulmonary vascular congestion.  Chronic interstitial markings, without frank interstitial edema. No  pleural effusion or pneumothorax.  Prominence of the right hilum, likely vascular.  IMPRESSION: Cardiomegaly with pulmonary vascular congestion.  No frank interstitial edema.  Original Report Authenticated By: Charline Bills, M.D.     Disposition and Follow-up: Discharge Orders    Future Appointments: Provider: Department: Dept Phone: Center:   09/01/2011 3:15 PM [name], MD [facility] [phone] None     Future Orders Please Complete By Expires   Diet Carb Modified      Increase activity slowly      (HEART FAILURE PATIENTS) Call MD:  Anytime you have any of the following symptoms: 1) 3 pound weight gain in 24 hours or 5 pounds in 1 week 2) shortness of breath, with or without a dry hacking cough 3) swelling in the hands, feet or stomach 4) if you have to sleep on extra pillows at night in order to breathe.          DISPOSITION: Home with home R.N. followup  DIET: Carb modified 2 g sodium diet  ACTIVITY: As tolerated  DISCHARGE FOLLOW-UP Follow-up Information    Follow up with Crawford Givens, MD. Schedule an appointment as soon as possible for a visit in 2 weeks.      Follow up with Runell Gess, MD. (office will call)    Contact information:   [address] [place] [address] [phone]          Time spent on Discharge: 45 Minutes  Signed:  Berthold Glace M.D. [facility] 08/26/2011, 7:00 PM

## 2011-08-26 NOTE — Progress Notes (Signed)
Subjective:  SOB improved compared to admission.  Objective:  Vital Signs in the last 24 hours: Temp:  [97.6 F (36.4 C)-100.6 F (38.1 C)] 97.6 F (36.4 C) (03/01 0700) Pulse Rate:  [50-57] 50  (03/01 0856) Resp:  [16-20] 16  (03/01 0856) BP: (98-113)/(48-83) 98/51 mmHg (03/01 0700) SpO2:  [92 %-99 %] 99 % (03/01 0856) Weight:  [72.3 kg (159 lb 6.3 oz)] 72.3 kg (159 lb 6.3 oz) (03/01 0700)  Intake/Output from previous day:  Intake/Output Summary (Last 24 hours) at 08/26/11 0857 Last data filed at 08/26/11 2130  Gross per 24 hour  Intake   1446 ml  Output   1825 ml  Net   -379 ml    Physical Exam: General appearance: alert, cooperative and no distress Lungs: clear to auscultation bilaterally Heart: regular rate and rhythm no edema   Rate: 55  Rhythm: sinus bradycardia  Lab Results:  Basename 08/24/11 0544  WBC 7.5  HGB 10.5*  PLT 239    Basename 08/26/11 0700 08/25/11 1441  NA 139 135  K 4.6 4.3  CL 103 97  CO2 28 29  GLUCOSE 187* 198*  BUN 51* 53*  CREATININE 1.77* 2.13*   No results found for this basename: TROPONINI:2,CK,MB:2 in the last 72 hours Hepatic Function Panel  Basename 08/23/11 1041  PROT 7.3  ALBUMIN 3.2*  AST 13  ALT 18  ALKPHOS 105  BILITOT 0.8  BILIDIR 0.2  IBILI 0.6   No results found for this basename: CHOL in the last 72 hours  Basename 08/26/11 0700  INR 1.70*    Imaging: Imaging results have been reviewed  Cardiac Studies:  Assessment/Plan:   Principal Problem:  *Shortness of breath, improved  Active Problems:  Acute on chronic diastolic congestive heart failure  PAF, recurrance this admission, on chronic Amio/ Coumadin  Acute renal insuficency, SCr peaked 2.17 with diuresis, ARB stopped  CAD, remote RCA PCI, subsequently noted to be occluded, last cath 8/11  DM  HYPERLIPIDEMIA, statin intol  PULMONARY HYPERTENSION, SECONDARY, mod-severe in past  COPD, home O2  RBBB  Mild aortic stenosis, by 2D this adm  PVD (peripheral vascular disease), moderate carotid and LSCA disease  Chronic anticoagulation  HTN (hypertension)  Leukocytosis  Bradycardia  Plan-Leave off ARB, send home on Lasix 40mg  daily, f/u with Dr Allyson Sabal to be arranged.  Corine Shelter PA-C 08/26/2011, 8:57 AM  I have seen and examined the patient along with Corine Shelter PA-C.  I have reviewed the chart, notes and new data.  I agree with PA's note.  Key new complaints: breathing close to baseline Key examination changes: weight 2.3 kg less than admission Key new findings / data: creatinine rise is reversing  PLAN: OK for DC home from cardiac standpoint. Will make arrangements for outpatient myocardial perfusion study and follow-up with Dr. Allyson Sabal.  Thurmon Fair, MD, [facility] [phone] 08/26/2011, 10:39 AM

## 2011-08-29 ENCOUNTER — Telehealth: Payer: Self-pay | Admitting: *Deleted

## 2011-08-29 ENCOUNTER — Encounter: Payer: Self-pay | Admitting: Family Medicine

## 2011-08-29 NOTE — Telephone Encounter (Signed)
[facility] called to get orders for a Physical Therapy Evaluation.  Patient is at risk for fall and increase strength in lower extremities.  Please call in a verbal order.

## 2011-08-29 NOTE — Telephone Encounter (Signed)
Please call in a verbal order for a Physical Therapy Evaluation. Patient is at risk for fall and needs to increase strength in lower extremities.  Dx 518.83.  And Metoprolol Succinate ER 25mg  dose was changed on discharge from one tablet daily to 1/2 tablet daily.  This is correct.  Thanks.

## 2011-08-29 NOTE — Telephone Encounter (Signed)
LMOVM of [name] at [facility] giving verbal order with Dx.   Medication List is updated with current Metoprolol dosing.

## 2011-08-29 NOTE — Telephone Encounter (Signed)
Received fax from pharmacy stating that Metoprolol Succinate ER 25mg  dose was changed on discharge from one tablet daily to 1/2 tablet daily.  Please advise.

## 2011-09-01 ENCOUNTER — Other Ambulatory Visit: Payer: Self-pay | Admitting: *Deleted

## 2011-09-01 ENCOUNTER — Ambulatory Visit: Payer: No Typology Code available for payment source | Admitting: Pulmonary Disease

## 2011-09-01 MED ORDER — METOPROLOL SUCCINATE ER 25 MG PO TB24: 12.5000 mg | ORAL_TABLET | Freq: Every day | ORAL | Status: DC

## 2011-09-07 ENCOUNTER — Other Ambulatory Visit: Payer: Self-pay | Admitting: *Deleted

## 2011-09-07 MED ORDER — WARFARIN SODIUM 5 MG PO TABS: 5.0000 mg | ORAL_TABLET | Freq: Every day | ORAL | Status: DC

## 2011-09-11 DIAGNOSIS — I2510 Atherosclerotic heart disease of native coronary artery without angina pectoris: Secondary | ICD-10-CM

## 2011-09-11 DIAGNOSIS — I4891 Unspecified atrial fibrillation: Secondary | ICD-10-CM

## 2011-09-11 DIAGNOSIS — I509 Heart failure, unspecified: Secondary | ICD-10-CM

## 2011-09-11 DIAGNOSIS — E119 Type 2 diabetes mellitus without complications: Secondary | ICD-10-CM

## 2011-09-28 ENCOUNTER — Other Ambulatory Visit: Payer: Self-pay | Admitting: *Deleted

## 2011-09-28 NOTE — Telephone Encounter (Signed)
Received faxed refill request from pharmacy. Is it okay to refill medication? 

## 2011-09-29 MED ORDER — ALPRAZOLAM 0.25 MG PO TABS: 0.2500 mg | ORAL_TABLET | Freq: Every evening | ORAL | Status: DC | PRN

## 2011-09-29 NOTE — Telephone Encounter (Signed)
Medication phoned to pharmacy.  

## 2011-09-29 NOTE — Telephone Encounter (Signed)
please call in

## 2011-10-06 ENCOUNTER — Ambulatory Visit: Payer: Medicare Other | Admitting: Family Medicine

## 2011-10-07 ENCOUNTER — Encounter: Payer: Self-pay | Admitting: Pulmonary Disease

## 2011-10-07 ENCOUNTER — Ambulatory Visit (INDEPENDENT_AMBULATORY_CARE_PROVIDER_SITE_OTHER): Payer: Medicare Other | Admitting: Pulmonary Disease

## 2011-10-07 VITALS — BP 112/62 | HR 55 | Temp 98.1°F | Ht 61.0 in | Wt 159.4 lb

## 2011-10-07 DIAGNOSIS — J449 Chronic obstructive pulmonary disease, unspecified: Secondary | ICD-10-CM

## 2011-10-07 DIAGNOSIS — G4736 Sleep related hypoventilation in conditions classified elsewhere: Secondary | ICD-10-CM

## 2011-10-07 DIAGNOSIS — G4739 Other sleep apnea: Secondary | ICD-10-CM

## 2011-10-07 NOTE — Assessment & Plan Note (Signed)
She is to continue supplemental oxygen with sleep.  I explained that much of her nocturnal symptoms could be related to sleep apnea.  Also discussed how sleep disordered breathing can impact her cardiac disease.  I have advised that she should reconsider further evaluation for sleep disordered breathing.  She will think about this.

## 2011-10-07 NOTE — Patient Instructions (Signed)
Follow up in 6 months 

## 2011-10-07 NOTE — Progress Notes (Signed)
Chief Complaint  Patient presents with  . Follow-up    Pt states she has had some increase SOB, chest pressure (told by Dr. Allyson Sabal it was due to CHF). phlem feels stuck in her throat and is having a lot of nose bleeds   CC: [name]  History of Present Illness: [name] is a 78 y.o. female dyspnea, chronic respiratory failure, COPD, diastolic dysfunction, OSA/OHS, and 2nd pulmonary HTN.  Since her last visit with me she was hospitalized with heart failure.  She had her lasix increased.  She has most of her breathing troubles at night.  She goes to sleep, but wakes up a few hours later gasping for breathing.  She can't sleep laying flat.  She feels sleepy throughout the day.  She does snore.  She is not having much cough, wheeze, or sputum.  She uses her combivent once per day.  She has been getting frequent nose bleeds.   Past Medical History  Diagnosis Date  . Hyperlipidemia   . Hypertension   . Anxiety   . GERD (gastroesophageal reflux disease)   . CAD (coronary artery disease)     Cath Aug. 2011 > medical therapy recommended  . Diastolic dysfunction     Grade II  . RBBB (right bundle branch block)     Chronic  . Paroxysmal atrial fibrillation   . Hypoxemic respiratory failure, chronic     uses 2.5 liters of oxygen with sleep  . COPD (chronic obstructive pulmonary disease)     GOLD 4 - PFT 06/08/10 FEV1 0.93 (62%), FEV1% 60, TLC 2.84 (69%0, DLCO 54%, +BD  . Secondary pulmonary hypertension   . Edema   . Angina   . Shortness of breath   . CHF (congestive heart failure)   . Diabetes mellitus     Type II - goal A1C is 8, to avoid hypoglycemia  . Bradycardia 08/24/2011    Past Surgical History  Procedure Date  . Rotator cuff repair 07/2005    right  . Abdominal hysterectomy   . Cardiac stents     Allergies  Allergen Reactions  . Atorvastatin     REACTION: muscle aches  . Codeine Nausea And Vomiting  . Iohexol      Desc: unknown reaction; allergic to  iodine and contrast   . Morphine Nausea And Vomiting  . Rosuvastatin     REACTION: muscle aches at high doses, held as of 12/2010 due to aches    Physical Exam:  Blood pressure 112/62, pulse 55, temperature 98.1 F (36.7 C), temperature source Oral, height 5\' 1"  (1.549 m), weight 159 lb 6.4 oz (72.303 kg), SpO2 93.00%. Body mass index is 30.12 kg/(m^2). Wt Readings from Last 2 Encounters:  10/07/11 159 lb 6.4 oz (72.303 kg)  08/26/11 159 lb 6.3 oz (72.3 kg)    General - No distress HEENT - no sinus tenderness, no oral exudate, no LAN Cardiac - s1s2 Chest - no wheeze/rales Abdomen - soft, nontender Extremities - no edema Skin - no rashes Neurologic - normal strength Psychiatric - normal mood, behavior   Assessment/Plan:  Outpatient Encounter Prescriptions as of 10/07/2011  Medication Sig Dispense Refill  . albuterol-ipratropium (COMBIVENT) 18-103 MCG/ACT inhaler Inhale 2 puffs into the lungs every 4 (four) hours as needed. For shortness of breath  1 Inhaler  3  . ALPRAZolam (XANAX) 0.25 MG tablet Take 1 tablet (0.25 mg total) by mouth at bedtime as needed. And can take 1/2 to 1 tablet twice daily as  needed for anxiety.  30 tablet  2  . amiodarone (PACERONE) 200 MG tablet Take 200 mg by mouth every morning.       Marland Kitchen aspirin 81 MG tablet Take 81 mg by mouth daily.        . budesonide-formoterol (SYMBICORT) 160-4.5 MCG/ACT inhaler Inhale 2 puffs into the lungs 2 (two) times daily.        . furosemide (LASIX) 40 MG tablet Take 1 tablet (40 mg total) by mouth daily.  60 tablet  1  . HUMULIN R 100 UNIT/ML injection Take as diredcted      . isosorbide mononitrate (IMDUR) 60 MG 24 hr tablet Take 60 mg by mouth every morning.      . metoprolol succinate (TOPROL-XL) 25 MG 24 hr tablet Take 0.5 tablets (12.5 mg total) by mouth daily. 1/2 tablet daily  45 tablet  3  . Multiple Vitamins-Minerals (PRESERVISION/LUTEIN) CAPS Take by mouth daily.        Marland Kitchen NIFEdipine (PROCARDIA-XL) 30 MG (OSM) 24  hr tablet Take 30 mg by mouth every morning.        . pantoprazole (PROTONIX) 40 MG tablet Take 40 mg by mouth every morning.       . potassium chloride SA (KLOR-CON M20) 20 MEQ tablet Take 20 mEq by mouth every morning.        Marland Kitchen ULTIMA TEST test strip USE ONE THREE TIMES DAILY  300 each  PRN  . VALUE PLUS LANCETS THIN 26G MISC Use as directed.  200 each  11  . warfarin (COUMADIN) 5 MG tablet Take 1 tablet (5 mg total) by mouth daily.  90 tablet  1  . DISCONTD: insulin detemir (LEVEMIR FLEXPEN) 100 UNIT/ML injection Inject 25 Units into the skin at bedtime.       Marland Kitchen DISCONTD: insulin regular (NOVOLIN R,HUMULIN R) 100 units/mL injection Inject 0-8 Units into the skin 3 (three) times daily before meals. Per sliding scale: 150 or less, no R, 151 or higher 4 units, > 200, 8 units        [name] Pager:  [phone] 10/07/2011, 1:44 PM

## 2011-10-07 NOTE — Assessment & Plan Note (Signed)
This seems to be stable on her current inhaler regimen.  I don't think her current difficulties are related to her COPD.

## 2011-10-10 ENCOUNTER — Encounter: Payer: Self-pay | Admitting: Family Medicine

## 2011-10-10 ENCOUNTER — Ambulatory Visit (INDEPENDENT_AMBULATORY_CARE_PROVIDER_SITE_OTHER): Payer: Medicare Other | Admitting: Family Medicine

## 2011-10-10 VITALS — BP 104/64 | HR 59 | Temp 97.9°F | Wt 160.8 lb

## 2011-10-10 DIAGNOSIS — Z7901 Long term (current) use of anticoagulants: Secondary | ICD-10-CM

## 2011-10-10 DIAGNOSIS — E119 Type 2 diabetes mellitus without complications: Secondary | ICD-10-CM

## 2011-10-10 DIAGNOSIS — I10 Essential (primary) hypertension: Secondary | ICD-10-CM

## 2011-10-10 LAB — BASIC METABOLIC PANEL
CO2: 30 mEq/L (ref 19–32)
Calcium: 9.3 mg/dL (ref 8.4–10.5)
Creatinine, Ser: 1.6 mg/dL — ABNORMAL HIGH (ref 0.4–1.2)
GFR: 32.13 mL/min — ABNORMAL LOW (ref >60.00)
Sodium: 142 mEq/L (ref 135–145)

## 2011-10-10 NOTE — Patient Instructions (Signed)
Don't change your meds. Go to the lab on the way out.  We'll contact you with your lab report. Plan on coming back in 4 months, sooner if needed.

## 2011-10-11 ENCOUNTER — Encounter: Payer: Self-pay | Admitting: *Deleted

## 2011-10-11 NOTE — Assessment & Plan Note (Signed)
Per cards.  Nosebleed controlled with pressure, didn't need packing.  She'll discuss with cards about nosebleeds.

## 2011-10-11 NOTE — Assessment & Plan Note (Signed)
ARB held prev, edema controlled and Cr stable.

## 2011-10-11 NOTE — Progress Notes (Signed)
Family stress continues.  She's moved back to [place].  Living alone but has help with others driving for her.    DM2.  Using sliding scale w/o lows are prev noted.  Feeling better about her sugar control recently.  Tolerating meds.   AF per cards with occ nosebleed.  Has INR monitored.  Still with SOB likely related to CV status, using O2 at night.    H/o ARF in [facility] with last hospitalization and ARB held.  Back on lasix now and due for recheck Cr.  Edema controlled.  She is monitoring her weight daily.    PMH and SH reviewed  ROS: See HPI, otherwise noncontributory.  Meds, vitals, and allergies reviewed.   nad ncat L nostril with mild nosebleed (a few drops of blood), controlled with pressure Op wnl rrr with ectopy noted but not tachy No wheeze, no focal dec in bs Ext w/o edema

## 2011-10-11 NOTE — Assessment & Plan Note (Signed)
See notes on labs.  Goal A1c ~8 to prevent lows.  Continue as is with current meds.  >25 min spent with face to face with patient, >50% counseling and/or coordinating care

## 2011-11-15 ENCOUNTER — Other Ambulatory Visit: Payer: Self-pay | Admitting: *Deleted

## 2011-11-15 NOTE — Telephone Encounter (Signed)
Faxed refill request from [facility] pharmacy.  Last filled 10/03/11.  Request is for one tablet twice a day, number 60.  Chart has that pt takes one a day.  Please advise.

## 2011-11-16 MED ORDER — FUROSEMIDE 40 MG PO TABS: ORAL_TABLET | ORAL | Status: DC

## 2011-11-16 NOTE — Telephone Encounter (Signed)
Please clarify with pt about how she has been taking this.  Then send in 1 month supply with 5 rf.  Thanks.

## 2011-11-16 NOTE — Telephone Encounter (Signed)
Pt states she takes one twice a day, says the dose was increased while she was in the hospital.  Refills sent to pharmacy.

## 2011-12-05 HISTORY — PX: OTHER SURGICAL HISTORY: SHX169

## 2011-12-07 ENCOUNTER — Other Ambulatory Visit: Payer: Self-pay | Admitting: Family Medicine

## 2011-12-07 MED ORDER — MIRTAZAPINE 15 MG PO TABS: 15.0000 mg | ORAL_TABLET | Freq: Every day | ORAL | Status: DC

## 2012-02-14 ENCOUNTER — Ambulatory Visit: Payer: Medicare Other | Admitting: Family Medicine

## 2012-02-14 ENCOUNTER — Other Ambulatory Visit: Payer: Medicare Other

## 2012-03-02 ENCOUNTER — Encounter (INDEPENDENT_AMBULATORY_CARE_PROVIDER_SITE_OTHER): Payer: Medicare Other | Admitting: Ophthalmology

## 2012-03-02 DIAGNOSIS — E1165 Type 2 diabetes mellitus with hyperglycemia: Secondary | ICD-10-CM

## 2012-03-02 DIAGNOSIS — E11319 Type 2 diabetes mellitus with unspecified diabetic retinopathy without macular edema: Secondary | ICD-10-CM

## 2012-03-02 DIAGNOSIS — I10 Essential (primary) hypertension: Secondary | ICD-10-CM

## 2012-03-02 DIAGNOSIS — H3530 Unspecified macular degeneration: Secondary | ICD-10-CM

## 2012-03-02 DIAGNOSIS — E1139 Type 2 diabetes mellitus with other diabetic ophthalmic complication: Secondary | ICD-10-CM

## 2012-03-02 DIAGNOSIS — H35039 Hypertensive retinopathy, unspecified eye: Secondary | ICD-10-CM

## 2012-03-02 DIAGNOSIS — H43819 Vitreous degeneration, unspecified eye: Secondary | ICD-10-CM

## 2012-03-12 ENCOUNTER — Encounter: Payer: Self-pay | Admitting: Family Medicine

## 2012-03-12 ENCOUNTER — Ambulatory Visit (INDEPENDENT_AMBULATORY_CARE_PROVIDER_SITE_OTHER): Payer: Medicare Other | Admitting: Family Medicine

## 2012-03-12 VITALS — BP 154/84 | HR 69 | Temp 98.3°F | Wt 166.8 lb

## 2012-03-12 DIAGNOSIS — E119 Type 2 diabetes mellitus without complications: Secondary | ICD-10-CM

## 2012-03-12 DIAGNOSIS — R269 Unspecified abnormalities of gait and mobility: Secondary | ICD-10-CM

## 2012-03-12 DIAGNOSIS — R5383 Other fatigue: Secondary | ICD-10-CM

## 2012-03-12 DIAGNOSIS — Z23 Encounter for immunization: Secondary | ICD-10-CM

## 2012-03-12 DIAGNOSIS — R2681 Unsteadiness on feet: Secondary | ICD-10-CM

## 2012-03-12 DIAGNOSIS — L570 Actinic keratosis: Secondary | ICD-10-CM

## 2012-03-12 MED ORDER — WARFARIN SODIUM 5 MG PO TABS: ORAL_TABLET | ORAL | Status: DC

## 2012-03-12 NOTE — Progress Notes (Signed)
Fatigued.  She is deconditioned and she notices it in her legs.  Getting SOB with with exertion.  She doesn't have transportation for cardio[facility]pulm re[facility]  No formal facilities as her apartment.  She is walking some.  No falls except then the brakes failed on her walker and this was an isolated event.  She had f/u with cards last week- rec'd no change in meds per patient. Has a single point cane today, as usual.   Sugar has been under fair control, "better than it's been in a long time."  ~130-150 in AM.  Rare nocturnal sx, esp if she has a snack at night.  See notes on labs.   Skin lesions on arms noted, flaky, raised and itchy.    Meds, vitals, and allergies reviewed.   ROS: See HPI.  Otherwise, noncontributory.  nad ncat Mmm rrr ctab abd soft, not ttp Ext w/o edema 6 tough skin colored slightly raised, rough lesions noted on R arm/dorsum on R hand, and 2 similar lesions on L arm.  No ulceration or rolled border.

## 2012-03-12 NOTE — Patient Instructions (Addendum)
Try to keep walking for exercise.   Go to the lab on the way out.  We'll contact you with your lab report. Plan on recheck in 6 months, sooner if needed.  Take care.

## 2012-03-13 ENCOUNTER — Encounter: Payer: Self-pay | Admitting: *Deleted

## 2012-03-13 LAB — BASIC METABOLIC PANEL
CO2: 28 mEq/L (ref 19–32)
Calcium: 8.7 mg/dL (ref 8.4–10.5)
Creatinine, Ser: 2 mg/dL — ABNORMAL HIGH (ref 0.4–1.2)
GFR: 25.65 mL/min — ABNORMAL LOW (ref >60.00)

## 2012-03-13 LAB — HEMOGLOBIN A1C: Hgb A1c MFr Bld: 8.3 % — ABNORMAL HIGH (ref 4.6–6.5)

## 2012-03-13 NOTE — Assessment & Plan Note (Signed)
Likely due to cardio/pulm source and would encourage gradual inc in walking to max out endurance.  She understood.

## 2012-03-13 NOTE — Assessment & Plan Note (Signed)
Continue work on diet, see notes on labs.

## 2012-03-13 NOTE — Assessment & Plan Note (Signed)
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  4 lesions on R arm frozen with liq N2, each lesion 3 times.  Tolerated well. Routine instructions given and understood.

## 2012-03-13 NOTE — Assessment & Plan Note (Signed)
Continue cane/walker use.

## 2012-03-30 ENCOUNTER — Other Ambulatory Visit: Payer: Self-pay | Admitting: *Deleted

## 2012-03-30 NOTE — Telephone Encounter (Signed)
Faxed refill request.  Last filled 02/06/2012

## 2012-04-01 MED ORDER — ALPRAZOLAM 0.25 MG PO TABS: 0.2500 mg | ORAL_TABLET | Freq: Every evening | ORAL | Status: DC | PRN

## 2012-04-01 NOTE — Telephone Encounter (Signed)
Please call in

## 2012-04-02 ENCOUNTER — Other Ambulatory Visit: Payer: Self-pay | Admitting: Family Medicine

## 2012-04-02 NOTE — Telephone Encounter (Signed)
Medication phoned to pharmacy.  

## 2012-04-27 ENCOUNTER — Ambulatory Visit: Payer: Medicare Other | Admitting: Pulmonary Disease

## 2012-04-27 ENCOUNTER — Encounter: Payer: Self-pay | Admitting: Pulmonary Disease

## 2012-04-27 ENCOUNTER — Ambulatory Visit (INDEPENDENT_AMBULATORY_CARE_PROVIDER_SITE_OTHER): Payer: Medicare Other | Admitting: Pulmonary Disease

## 2012-04-27 VITALS — BP 122/64 | HR 54 | Temp 97.5°F | Ht 61.0 in | Wt 168.0 lb

## 2012-04-27 DIAGNOSIS — G4736 Sleep related hypoventilation in conditions classified elsewhere: Secondary | ICD-10-CM

## 2012-04-27 DIAGNOSIS — J449 Chronic obstructive pulmonary disease, unspecified: Secondary | ICD-10-CM

## 2012-04-27 DIAGNOSIS — G4739 Other sleep apnea: Secondary | ICD-10-CM

## 2012-04-27 MED ORDER — TIOTROPIUM BROMIDE MONOHYDRATE 18 MCG IN CAPS: 18.0000 ug | ORAL_CAPSULE | Freq: Every day | RESPIRATORY_TRACT | Status: DC

## 2012-04-27 NOTE — Assessment & Plan Note (Signed)
She has more trouble with her breathing with exertion.  Will try adding spiriva.  She is to continue symbicort.

## 2012-04-27 NOTE — Patient Instructions (Addendum)
Spiriva one puff daily Continue symbicort two puffs twice per day Use 2 liters oxygen with exertion and 2.5 liters at night Follow up in 3 months

## 2012-04-27 NOTE — Assessment & Plan Note (Addendum)
She has oxygen desaturation with exertion.  She is to use 2.5 liters oxygen with exertion and sleep.

## 2012-04-27 NOTE — Progress Notes (Signed)
Chief Complaint  Patient presents with  . Follow-up    pt states still SOB with heavy exertion. Pt c/o upper chest/throat congestion in mornings that she has a hard time clearing.     CC: [name]  History of Present Illness: [name] is a 78 y.o. female former smoker with GOLD 4 COPD, and chronic respiratory failure with hypoxia.  She has been feeling weaker when she does activity.  She gets winded more easily.  She is not having cough, wheeze, or sputum.  She does get phlegm in her throat in the mornings.  She denies reflux.  She gets occasional leg swelling.  She is using combivent once per day, and symbicort twice per day.  She use 2.5 liters oxygen nightly with sleep.  Tests:  PFT 06/08/10>>FEV1 0.93(62%), FEV1% 60, TLC 2.84(69%), DLCO 54%, +BD Echo 08/23/11>>mild LVH, EF 55 to 60%, mild AS, mild MR, mod/severe LA dilation CXR 08/24/11>>Cardiomegaly with vascular congestion  Past Medical History  Diagnosis Date  . Hyperlipidemia   . Hypertension   . Anxiety   . GERD (gastroesophageal reflux disease)   . CAD (coronary artery disease)     Cath Aug. 2011 > medical therapy recommended  . Diastolic dysfunction     Grade II  . RBBB (right bundle branch block)     Chronic  . Paroxysmal atrial fibrillation   . Hypoxemic respiratory failure, chronic     uses 2.5 liters of oxygen with sleep  . COPD (chronic obstructive pulmonary disease)     GOLD 4 - PFT 06/08/10 FEV1 0.93 (62%), FEV1% 60, TLC 2.84 (69%0, DLCO 54%, +BD  . Secondary pulmonary hypertension   . Edema   . Angina   . Shortness of breath   . CHF (congestive heart failure)   . Diabetes mellitus     Type II - goal A1C is 8, to avoid hypoglycemia  . Bradycardia 08/24/2011    Past Surgical History  Procedure Date  . Rotator cuff repair 07/2005    right  . Abdominal hysterectomy   . Cardiac stents     Allergies  Allergen Reactions  . Atorvastatin     REACTION: muscle aches  . Codeine Nausea And  Vomiting  . Iohexol      Desc: unknown reaction; allergic to iodine and contrast   . Morphine Nausea And Vomiting  . Rosuvastatin     REACTION: muscle aches at high doses, held as of 12/2010 due to aches    Physical Exam:  Filed Vitals:   04/27/12 1430  BP: 122/64  Pulse: 54  Temp: 97.5 F (36.4 C)  TempSrc: Oral  Height: 5\' 1"  (1.549 m)  Weight: 168 lb (76.204 kg)  SpO2: 91%    Body mass index is 31.74 kg/(m^2).   Wt Readings from Last 2 Encounters:  04/27/12 168 lb (76.204 kg)  03/12/12 166 lb 12 oz (75.637 kg)    General - No distress HEENT - no sinus tenderness, no oral exudate, no LAN Cardiac - s1s2 Chest - no wheeze/rales Abdomen - soft, nontender Extremities - no edema Skin - no rashes Neurologic - normal strength Psychiatric - normal mood, behavior   Assessment/Plan:  [name], MD [facility] Pulmonary/Critical Care 04/27/2012, 2:53 PM Pager:  [phone] After 3pm call: [phone]

## 2012-04-30 ENCOUNTER — Telehealth: Payer: Self-pay | Admitting: Pulmonary Disease

## 2012-04-30 NOTE — Telephone Encounter (Signed)
Spoke with pt. She states forgot to get sample of spiriva at last ov-had to leave before could learn how to use this. I advised will leave sample up front and will show her how to use once she gets here. Nothing further needed.

## 2012-05-09 ENCOUNTER — Telehealth: Payer: Self-pay | Admitting: Pulmonary Disease

## 2012-05-09 NOTE — Telephone Encounter (Signed)
I spoke with patient about results and she verbalized understanding and had no questions 

## 2012-05-09 NOTE — Telephone Encounter (Signed)
ONO with 2.5 liters 05/07/12>>Test time 3 hrs 47 min.  Mean SpO2 99%, low SpO2 96%.  Will have my nurse inform pt that oxygen test looked good.  She can decrease oxygen to 1.5 liters at night while asleep, but needs to continue using oxygen at 2.5 liters with exertion.

## 2012-06-04 ENCOUNTER — Other Ambulatory Visit: Payer: Self-pay | Admitting: Family Medicine

## 2012-06-12 ENCOUNTER — Encounter: Payer: Self-pay | Admitting: Pulmonary Disease

## 2012-06-28 ENCOUNTER — Other Ambulatory Visit: Payer: Self-pay | Admitting: Family Medicine

## 2012-07-06 ENCOUNTER — Other Ambulatory Visit: Payer: Self-pay

## 2012-07-06 NOTE — Telephone Encounter (Signed)
[facility] faxed refill humulin R; last filled 03/20/12.Please advise.

## 2012-07-07 MED ORDER — INSULIN REGULAR HUMAN 100 UNIT/ML IJ SOLN: INTRAMUSCULAR | Status: DC

## 2012-07-07 NOTE — Telephone Encounter (Signed)
Sent!

## 2012-07-13 ENCOUNTER — Other Ambulatory Visit: Payer: Self-pay | Admitting: *Deleted

## 2012-07-13 MED ORDER — ALPRAZOLAM 0.25 MG PO TABS: 0.2500 mg | ORAL_TABLET | Freq: Every evening | ORAL | Status: DC | PRN

## 2012-07-13 NOTE — Telephone Encounter (Signed)
Medication phoned to [facility] as instructed.

## 2012-07-13 NOTE — Telephone Encounter (Signed)
Please call in.  Thanks.   

## 2012-07-20 ENCOUNTER — Emergency Department (HOSPITAL_COMMUNITY): Payer: Medicare Other

## 2012-07-20 ENCOUNTER — Inpatient Hospital Stay (HOSPITAL_COMMUNITY)
Admission: EM | Admit: 2012-07-20 | Discharge: 2012-07-23 | DRG: 069 | Disposition: A | Discharge status: Skilled Nursing Facility | Payer: Medicare Other | Attending: Internal Medicine | Admitting: Internal Medicine

## 2012-07-20 ENCOUNTER — Encounter (HOSPITAL_COMMUNITY): Payer: Self-pay | Admitting: *Deleted

## 2012-07-20 DIAGNOSIS — R270 Ataxia, unspecified: Secondary | ICD-10-CM

## 2012-07-20 DIAGNOSIS — Z7901 Long term (current) use of anticoagulants: Secondary | ICD-10-CM

## 2012-07-20 DIAGNOSIS — I739 Peripheral vascular disease, unspecified: Secondary | ICD-10-CM

## 2012-07-20 DIAGNOSIS — J9610 Chronic respiratory failure, unspecified whether with hypoxia or hypercapnia: Secondary | ICD-10-CM

## 2012-07-20 DIAGNOSIS — R2681 Unsteadiness on feet: Secondary | ICD-10-CM

## 2012-07-20 DIAGNOSIS — Z7982 Long term (current) use of aspirin: Secondary | ICD-10-CM

## 2012-07-20 DIAGNOSIS — E119 Type 2 diabetes mellitus without complications: Secondary | ICD-10-CM | POA: Diagnosis present

## 2012-07-20 DIAGNOSIS — K219 Gastro-esophageal reflux disease without esophagitis: Secondary | ICD-10-CM | POA: Diagnosis present

## 2012-07-20 DIAGNOSIS — W19XXXA Unspecified fall, initial encounter: Secondary | ICD-10-CM

## 2012-07-20 DIAGNOSIS — G458 Other transient cerebral ischemic attacks and related syndromes: Principal | ICD-10-CM | POA: Diagnosis present

## 2012-07-20 DIAGNOSIS — R5383 Other fatigue: Secondary | ICD-10-CM

## 2012-07-20 DIAGNOSIS — I509 Heart failure, unspecified: Secondary | ICD-10-CM | POA: Diagnosis present

## 2012-07-20 DIAGNOSIS — M25519 Pain in unspecified shoulder: Secondary | ICD-10-CM

## 2012-07-20 DIAGNOSIS — Z79899 Other long term (current) drug therapy: Secondary | ICD-10-CM

## 2012-07-20 DIAGNOSIS — L570 Actinic keratosis: Secondary | ICD-10-CM

## 2012-07-20 DIAGNOSIS — Z87891 Personal history of nicotine dependence: Secondary | ICD-10-CM

## 2012-07-20 DIAGNOSIS — W1809XA Striking against other object with subsequent fall, initial encounter: Secondary | ICD-10-CM | POA: Diagnosis present

## 2012-07-20 DIAGNOSIS — I4891 Unspecified atrial fibrillation: Secondary | ICD-10-CM | POA: Diagnosis present

## 2012-07-20 DIAGNOSIS — S3210XA Unspecified fracture of sacrum, initial encounter for closed fracture: Secondary | ICD-10-CM | POA: Diagnosis present

## 2012-07-20 DIAGNOSIS — R29898 Other symptoms and signs involving the musculoskeletal system: Secondary | ICD-10-CM

## 2012-07-20 DIAGNOSIS — I350 Nonrheumatic aortic (valve) stenosis: Secondary | ICD-10-CM

## 2012-07-20 DIAGNOSIS — R0902 Hypoxemia: Secondary | ICD-10-CM

## 2012-07-20 DIAGNOSIS — Z9181 History of falling: Secondary | ICD-10-CM

## 2012-07-20 DIAGNOSIS — I480 Paroxysmal atrial fibrillation: Secondary | ICD-10-CM | POA: Diagnosis present

## 2012-07-20 DIAGNOSIS — J439 Emphysema, unspecified: Secondary | ICD-10-CM | POA: Diagnosis present

## 2012-07-20 DIAGNOSIS — Z23 Encounter for immunization: Secondary | ICD-10-CM

## 2012-07-20 DIAGNOSIS — R269 Unspecified abnormalities of gait and mobility: Secondary | ICD-10-CM

## 2012-07-20 DIAGNOSIS — J9611 Chronic respiratory failure with hypoxia: Secondary | ICD-10-CM

## 2012-07-20 DIAGNOSIS — I2510 Atherosclerotic heart disease of native coronary artery without angina pectoris: Secondary | ICD-10-CM | POA: Diagnosis present

## 2012-07-20 DIAGNOSIS — Z794 Long term (current) use of insulin: Secondary | ICD-10-CM

## 2012-07-20 DIAGNOSIS — I4510 Unspecified right bundle-branch block: Secondary | ICD-10-CM

## 2012-07-20 DIAGNOSIS — R279 Unspecified lack of coordination: Secondary | ICD-10-CM | POA: Diagnosis present

## 2012-07-20 DIAGNOSIS — R531 Weakness: Secondary | ICD-10-CM

## 2012-07-20 DIAGNOSIS — E785 Hyperlipidemia, unspecified: Secondary | ICD-10-CM | POA: Diagnosis present

## 2012-07-20 DIAGNOSIS — F411 Generalized anxiety disorder: Secondary | ICD-10-CM | POA: Diagnosis present

## 2012-07-20 DIAGNOSIS — Z888 Allergy status to other drugs, medicaments and biological substances status: Secondary | ICD-10-CM

## 2012-07-20 DIAGNOSIS — D72829 Elevated white blood cell count, unspecified: Secondary | ICD-10-CM

## 2012-07-20 DIAGNOSIS — Z825 Family history of asthma and other chronic lower respiratory diseases: Secondary | ICD-10-CM

## 2012-07-20 DIAGNOSIS — Z836 Family history of other diseases of the respiratory system: Secondary | ICD-10-CM

## 2012-07-20 DIAGNOSIS — Z8249 Family history of ischemic heart disease and other diseases of the circulatory system: Secondary | ICD-10-CM

## 2012-07-20 DIAGNOSIS — J4489 Other specified chronic obstructive pulmonary disease: Secondary | ICD-10-CM | POA: Diagnosis present

## 2012-07-20 DIAGNOSIS — Z9861 Coronary angioplasty status: Secondary | ICD-10-CM

## 2012-07-20 DIAGNOSIS — I519 Heart disease, unspecified: Secondary | ICD-10-CM

## 2012-07-20 DIAGNOSIS — I10 Essential (primary) hypertension: Secondary | ICD-10-CM | POA: Diagnosis present

## 2012-07-20 DIAGNOSIS — G459 Transient cerebral ischemic attack, unspecified: Secondary | ICD-10-CM | POA: Diagnosis present

## 2012-07-20 DIAGNOSIS — J449 Chronic obstructive pulmonary disease, unspecified: Secondary | ICD-10-CM | POA: Diagnosis present

## 2012-07-20 DIAGNOSIS — IMO0001 Reserved for inherently not codable concepts without codable children: Secondary | ICD-10-CM

## 2012-07-20 DIAGNOSIS — I5032 Chronic diastolic (congestive) heart failure: Secondary | ICD-10-CM | POA: Diagnosis present

## 2012-07-20 DIAGNOSIS — R296 Repeated falls: Secondary | ICD-10-CM

## 2012-07-20 LAB — URINALYSIS, ROUTINE W REFLEX MICROSCOPIC
Bilirubin Urine: NEGATIVE
Glucose, UA: NEGATIVE mg/dL
Hgb urine dipstick: NEGATIVE
Protein, ur: NEGATIVE mg/dL
Specific Gravity, Urine: 1.015 (ref 1.005–1.030)
Urobilinogen, UA: 0.2 mg/dL (ref 0.0–1.0)

## 2012-07-20 LAB — CBC WITH DIFFERENTIAL/PLATELET
Eosinophils Absolute: 0.1 10*3/uL (ref 0.0–0.7)
Eosinophils Relative: 1 % (ref 0–5)
HCT: 36.1 % (ref 36.0–46.0)
Lymphs Abs: 1.4 10*3/uL (ref 0.7–4.0)
MCH: 30.2 pg (ref 26.0–34.0)
MCV: 94.8 fL (ref 78.0–100.0)
Monocytes Absolute: 0.7 10*3/uL (ref 0.1–1.0)
Platelets: 306 10*3/uL (ref 150–400)
RBC: 3.81 MIL/uL — ABNORMAL LOW (ref 3.87–5.11)

## 2012-07-20 LAB — CBC
MCH: 30 pg (ref 26.0–34.0)
MCV: 94.5 fL (ref 78.0–100.0)
Platelets: 263 10*3/uL (ref 150–400)
RDW: 14.5 % (ref 11.5–15.5)

## 2012-07-20 LAB — URINE MICROSCOPIC-ADD ON

## 2012-07-20 LAB — BASIC METABOLIC PANEL
BUN: 28 mg/dL — ABNORMAL HIGH (ref 6–23)
CO2: 25 mEq/L (ref 19–32)
Chloride: 101 mEq/L (ref 96–112)
GFR calc non Af Amer: 35 mL/min — ABNORMAL LOW (ref >90)
Glucose, Bld: 194 mg/dL — ABNORMAL HIGH (ref 70–99)
Potassium: 4.7 mEq/L (ref 3.5–5.1)
Sodium: 140 mEq/L (ref 135–145)

## 2012-07-20 LAB — GLUCOSE, CAPILLARY

## 2012-07-20 LAB — TROPONIN I: Troponin I: <0.3 ng/mL (ref <0.30)

## 2012-07-20 MED ORDER — INSULIN ASPART 100 UNIT/ML ~~LOC~~ SOLN
0.0000 [IU] | Freq: Three times a day (TID) | SUBCUTANEOUS | Status: DC
  Administered 2012-07-21 – 2012-07-22 (×3): 2 [IU] via SUBCUTANEOUS
  Administered 2012-07-22: 1 [IU] via SUBCUTANEOUS
  Administered 2012-07-23: 2 [IU] via SUBCUTANEOUS

## 2012-07-20 MED ORDER — SODIUM CHLORIDE 0.9 % IJ SOLN
3.0000 mL | Freq: Two times a day (BID) | INTRAMUSCULAR | Status: DC
  Administered 2012-07-21 – 2012-07-23 (×3): 3 mL via INTRAVENOUS

## 2012-07-20 MED ORDER — ASPIRIN 325 MG PO TABS
325.0000 mg | ORAL_TABLET | Freq: Every day | ORAL | Status: DC
  Administered 2012-07-21 – 2012-07-23 (×3): 325 mg via ORAL
  Filled 2012-07-20 (×4): qty 1

## 2012-07-20 MED ORDER — ENOXAPARIN SODIUM 30 MG/0.3ML ~~LOC~~ SOLN
30.0000 mg | SUBCUTANEOUS | Status: DC
  Administered 2012-07-21 – 2012-07-22 (×3): 30 mg via SUBCUTANEOUS
  Filled 2012-07-20 (×7): qty 0.3

## 2012-07-20 MED ORDER — ALPRAZOLAM 0.25 MG PO TABS
0.2500 mg | ORAL_TABLET | Freq: Every evening | ORAL | Status: DC | PRN
  Administered 2012-07-22: 0.25 mg via ORAL
  Filled 2012-07-20: qty 1

## 2012-07-20 MED ORDER — ONDANSETRON HCL 4 MG/2ML IJ SOLN
4.0000 mg | Freq: Four times a day (QID) | INTRAMUSCULAR | Status: DC | PRN
  Administered 2012-07-20: 4 mg via INTRAVENOUS
  Filled 2012-07-20: qty 2

## 2012-07-20 MED ORDER — INSULIN ASPART 100 UNIT/ML ~~LOC~~ SOLN
0.0000 [IU] | Freq: Every day | SUBCUTANEOUS | Status: DC
  Administered 2012-07-22: 2 [IU] via SUBCUTANEOUS

## 2012-07-20 MED ORDER — SODIUM CHLORIDE 0.9 % IV SOLN: 250.0000 mL | INTRAVENOUS | Status: DC | PRN

## 2012-07-20 MED ORDER — SODIUM CHLORIDE 0.9 % IJ SOLN: 3.0000 mL | INTRAMUSCULAR | Status: DC | PRN

## 2012-07-20 MED ORDER — HYDROMORPHONE HCL PF 1 MG/ML IJ SOLN
0.5000 mg | Freq: Once | INTRAMUSCULAR | Status: AC
Start: 2012-07-20 — End: 2012-07-20
  Administered 2012-07-20: 0.5 mg via INTRAVENOUS
  Filled 2012-07-20: qty 1

## 2012-07-20 NOTE — H&P (Signed)
[facility] History and Physical  [name] UJW:[id number] DOB: July 09, 1996 DOA: 07/20/2012  Referring physician:  PCP: Melinda Givens, MD  Specialists:   Chief Complaint: Dizziness Falls  HPI: [name] is a 79 y.o. female with multiple medical problems who was brought to the ED due to sudden onset dizziness and nausea and vomiting X 1 in the AM, and her dizziness precipitated a fall in AM.  She denied having  any headache or chest pain.  She reports having more frequent falls and has fallen 3 times in the past 5 days.  When she 5 days ago she fell back and hit her head.   And in the AM when she fell again , she hit the back of her head again.   She also has had increased weakness in both legs and increased low back pain over the past 6 weeks.  She reports not being able to stand up or get up due to her weakness.  She feels the problem is with her knees.  Her PCP is making arrangements for an evaluation with Orthopedics as an outpatient.      In the ED, she was found to have a negative CT scan of the head for acute findings.  And she was referred for medical admission.       Review of Systems: The patient denies anorexia, fever, chills, weight loss, vision loss, decreased hearing, hoarseness, chest pain, syncope, dyspnea on exertion, peripheral edema, balance deficits, hemoptysis, abdominal pain, melena, hematochezia, severe indigestion/heartburn, hematuria, incontinence, genital sores, muscle weakness, suspicious skin lesions, transient blindness, difficulty walking, depression, unusual weight change, abnormal bleeding, enlarged lymph nodes, angioedema, and breast masses.    Past Medical History  Diagnosis Date  . Hyperlipidemia   . Hypertension   . Anxiety   . GERD (gastroesophageal reflux disease)   . CAD (coronary artery disease)     Cath Aug. 2011 > medical therapy recommended  . Diastolic dysfunction     Grade II  . RBBB (right bundle branch block)     Chronic  .  Paroxysmal atrial fibrillation   . Hypoxemic respiratory failure, chronic     uses 2.5 liters of oxygen with sleep  . COPD (chronic obstructive pulmonary disease)     GOLD 4 - PFT 06/08/10 FEV1 0.93 (62%), FEV1% 60, TLC 2.84 (69%0, DLCO 54%, +BD  . Secondary pulmonary hypertension   . Edema   . Angina   . Shortness of breath   . CHF (congestive heart failure)   . Diabetes mellitus     Type II - goal A1C is 8, to avoid hypoglycemia  . Bradycardia 08/24/2011   Past Surgical History  Procedure Date  . Rotator cuff repair 07/2005    right  . Abdominal hysterectomy   . Cardiac stents      x2    Medications:  HOME MEDS: Prior to Admission medications   Medication Sig Start Date End Date Taking? Authorizing Provider  albuterol-ipratropium (COMBIVENT) 18-103 MCG/ACT inhaler Inhale 2 puffs into the lungs every 4 (four) hours as needed. For shortness of breath 08/10/11  Yes [name], MD  ALPRAZolam (XANAX) 0.25 MG tablet Take 0.25 mg by mouth 2 (two) times daily as needed. for anxiety 07/13/12  Yes Joaquim Nam, MD  amiodarone (PACERONE) 200 MG tablet Take 200 mg by mouth every morning.    Yes Historical Provider, MD  aspirin 81 MG tablet Take 81 mg by mouth daily.     Yes Historical Provider, MD  budesonide-formoterol (SYMBICORT) 160-4.5 MCG/ACT inhaler Inhale 2 puffs into the lungs 2 (two) times daily.     Yes Historical Provider, MD  furosemide (LASIX) 40 MG tablet Take 40 mg by mouth 2 (two) times daily.   Yes Historical Provider, MD  insulin regular (NOVOLIN R,HUMULIN R) 100 units/mL injection Inject 0-8 Units into the skin 3 (three) times daily before meals. Take as directed per sliding scale:150 or less do not administer insulin, 151 or higher give 4 units & >200 give 8 units. 07/06/12  Yes Joaquim Nam, MD  isosorbide mononitrate (IMDUR) 60 MG 24 hr tablet Take 60 mg by mouth every morning. 07/07/11  Yes Joaquim Nam, MD  metoprolol succinate (TOPROL-XL) 25 MG 24 hr tablet  Take 12.5 mg by mouth daily. 09/01/11  Yes Joaquim Nam, MD  Multiple Vitamins-Minerals (PRESERVISION/LUTEIN) CAPS Take by mouth daily.     Yes Historical Provider, MD  pantoprazole (PROTONIX) 40 MG tablet Take 40 mg by mouth every morning.    Yes Historical Provider, MD  potassium chloride SA (KLOR-CON M20) 20 MEQ tablet Take 20 mEq by mouth every morning.     Yes Historical Provider, MD  tiotropium (SPIRIVA) 18 MCG inhalation capsule Place 1 capsule (18 mcg total) into inhaler and inhale daily. 04/27/12  Yes [name], MD  vitamin C (ASCORBIC ACID) 500 MG tablet Take 500 mg by mouth daily.   Yes Historical Provider, MD  warfarin (COUMADIN) 5 MG tablet Take 2.5-5 mg by mouth See admin instructions. Take 5mg  daily except for 2.47m on Saturday 03/12/12  Yes Joaquim Nam, MD  ULTIMA TEST test strip USE ONE THREE TIMES DAILY 08/08/11   Joaquim Nam, MD  VALUE PLUS LANCETS THIN 26G MISC Use as directed. 12/06/10   Joaquim Nam, MD    Allergies:  Allergies  Allergen Reactions  . Atorvastatin Other (See Comments)     muscle aches  . Codeine Nausea And Vomiting  . Iohexol Other (See Comments)     Desc: unknown reaction; allergic to iodine and contrast   . Morphine Nausea And Vomiting  . Rosuvastatin Other (See Comments)    muscle aches at high doses, held as of 12/2010 due to aches    Social History: Lives Alone and  Independently in [place]  reports that she quit smoking about 19 years ago. Her smoking use included Cigarettes. She has a 22.5 pack-year smoking history. She has never used smokeless tobacco. She reports that she drinks alcohol. She reports that she does not use illicit drugs.  Family History: Family History  Problem Relation Age of Onset  . Asthma Mother   . Cancer Mother     Skin  . Heart disease Mother   . Asthma Father   . Emphysema Father   . Asthma Brother   . Asthma Brother      Physical Exam:  GEN:  Pleasant 79 year old Obese Elderly Caucasian  Femaleexamined  and in no acute distress; cooperative with exam Filed Vitals:   07/20/12 1608 07/20/12 1612 07/20/12 1833  BP: 154/51  189/75  Pulse: 52  61  Temp: 97.9 F (36.6 C)    TempSrc: Oral    Resp:   17  SpO2: 93% 94% 97%   Blood pressure 189/75, pulse 61, temperature 97.9 F (36.6 C), temperature source Oral, resp. rate 17, SpO2 97.00%. PSYCH: She is alert and oriented x4; does not appear anxious does not appear depressed; affect is normal HEENT: Normocephalic and  Atraumatic, Mucous membranes pink; PERRLA; EOM intact; Fundi:  Benign;  No scleral icterus, Nares: Patent, Oropharynx: Clear, Fair Dentition, Neck:  FROM, no cervical lymphadenopathy nor thyromegaly or carotid bruit; no JVD; Breasts:: Not examined CHEST WALL: No tenderness CHEST: Normal respiration, clear to auscultation bilaterally HEART: Regular rate and rhythm; no murmurs rubs or gallops BACK: No kyphosis or scoliosis; no CVA tenderness ABDOMEN: Positive Bowel Sounds,  Obese, soft non-tender; no masses, no organomegaly, no pannus; no intertriginous candida. Rectal Exam: Not done EXTREMITIES: No bone or joint deformity; age-appropriate arthropathy of the hands and knees; no cyanosis, clubbing or edema; no ulcerations. Genitalia: not examined PULSES: 2+ and symmetric SKIN: Normal hydration no rash or ulceration CNS: Cranial nerves 2-12 grossly intact  Except for decreased vision, and decreased hearing, no focal neurologic deficits otherwise, Able to move all 4 extremities,  Gait was not assessed.   Mood , easily frustrated, mild decrease in memory.       Labs on Admission:  Basic Metabolic Panel:  Lab 07/20/12 1610  NA 140  K 4.7  CL 101  CO2 25  GLUCOSE 194*  BUN 28*  CREATININE 1.37*  CALCIUM 9.6  MG --  PHOS --   Liver Function Tests: No results found for this basename: AST:5,ALT:5,ALKPHOS:5,BILITOT:5,PROT:5,ALBUMIN:5 in the last 168 hours No results found for this basename: LIPASE:5,AMYLASE:5 in  the last 168 hours No results found for this basename: AMMONIA:5 in the last 168 hours CBC:  Lab 07/20/12 1643  WBC 9.1  NEUTROABS 6.8  HGB 11.5*  HCT 36.1  MCV 94.8  PLT 306   Cardiac Enzymes:  Lab 07/20/12 1643  CKTOTAL --  CKMB --  CKMBINDEX --  TROPONINI <0.30    BNP (last 3 results)  Basename 07/20/12 1643 08/24/11 0544 08/23/11 1041  PROBNP 1039.0* 808.6* 1536.0*   CBG: No results found for this basename: GLUCAP:5 in the last 168 hours  Radiological Exams on Admission: Dg Sacrum/coccyx  07/20/2012  *RADIOLOGY REPORT*  Clinical Data: Fall.  Low back pain.  SACRUM AND COCCYX - 2+ VIEW  Comparison: 07/10/2011  Findings: Prominent bony demineralization noted.  The contours of the sacrum below the sacroiliac joints are not well seen, with the cortex nearly completely obscured due to the bony demineralization. Accordingly, sensitivity for fracture is low.  Lower lumbar spondylosis noted.  No discrete fracture observed on the lateral projection.  There is elimination of the intervertebral disc space at L5-S1, along with vacuum disc phenomenon at L3-4 and L4-5, posterior osseous ridging at L3-4 and L4-5, and stable grade 1 anterolisthesis at L4-5.  Dense aortoiliac atherosclerotic calcification noted.  IMPRESSION:  1.  Lumbar spondylosis and degenerative disc disease. 2.  No definite sacral fracture.  Sensitivity is reduced due to bony demineralization. Followup nuclear medicine bone scan or MRI could be used for greater sensitivity. 3.  Aortoiliac atherosclerosis.   Original Report Authenticated By: Gaylyn Rong, M.D.    Dg Hip Bilateral W/pelvis  07/20/2012  *RADIOLOGY REPORT*  Clinical Data: Fall  BILATERAL HIP WITH PELVIS - 4+ VIEW  Comparison: None.  Findings: Five views bilateral hip submitted.  No acute fracture or subluxation.  Mild degenerative changes bilateral hip joints with narrowing superior joint space.  IMPRESSION: No acute fracture or subluxation.  Mild  degenerative changes bilateral hip joints.   Original Report Authenticated By: Natasha Mead, M.D.    Ct Head Wo Contrast  07/20/2012  *RADIOLOGY REPORT*  Clinical Data: Recurrent falls  CT HEAD WITHOUT CONTRAST  Technique:  Contiguous axial images were obtained from the base of the skull through the vertex without contrast.  Comparison: 07/10/2011  Findings: No skull fracture is noted.  Paranasal sinuses and mastoid air cells are unremarkable.  No intracranial hemorrhage, mass effect or midline shift.  Atherosclerotic calcifications of carotid siphon.  No acute infarction.  No mass lesion is noted on this unenhanced scan.  Mild cerebral atrophy.  IMPRESSION: No acute intracranial abnormality.  Mild cerebral atrophy.   Original Report Authenticated By: Natasha Mead, M.D.     EKG: Independently reviewed. Sinus Rhythm, RBBB (chronic)  Assessment/Plan: Principal Problem:  *TIA (transient ischemic attack) Active Problems:  Ataxia  Gait instability  Weakness of both legs  Falls frequently  DM  HYPERLIPIDEMIA, statin intol  DIASTOLIC DYSFUNCTION  COPD (chronic obstructive pulmonary disease)  HTN (hypertension)  CAD, remote RCA PCI, subsequently noted to be occluded, last cath 8/11  Paroxysmal atrial fibrillation   1.     TIA-rule out cerebellar Infarction, MRI/MRA Brain in AM, and TIA Protocol.    . 2.     Ataxia- Same as # 1.    3.     Gait Instability-   Rule out Cerebellar Infarct,   May be spinal in origin needs MRI ofSpine, or due to Muscle weakness.    4.     Weakness of Both Legs- Same as # 3.    5.     Frequent Falls- Due to #4.     6.     DM2-  Resume Home Meds, and add SSI coverage.    7.     COPD and Chronic Respiratory Failure-   Resume Home O2, and Titrate PRN.   8.     HTN, CAD, Paroxysmal Atrial Fibrillation-   Resume Home Medications, stable  9.     Reconcile Home Medications. 10.   DVT prophylaxis with Lovenox 11.  Other- Evaluate for pt, and short -term rehab.          Code Status:  FULL CODE Family Communication:  Daughter at  Bedside Disposition Plan: TBA   Time spent:  59 Minutes  Ron Parker [facility] Pager [phone] If 7PM-7AM, please contact night-coverage [email or web address] Password [facility] 07/20/2012, 8:56 PM

## 2012-07-20 NOTE — ED Provider Notes (Signed)
History     CSN: [id number]  Arrival date & time 07/20/12  1601   First MD Initiated Contact with Patient 07/20/12 1614      Chief Complaint  Patient presents with  . Fall    (Consider location/radiation/quality/duration/timing/severity/associated sxs/prior treatment) HPI Comments: This is an 79 year old female, past medical history of diabetes, hyperlipidemia, hypertension, CAD, and COPD, who presents emergency Department with a chief complaint of fall. Patient states that she has fallen 3-4 times in the past day. She states that she hit her head on a door frame, but denies any loss of consciousness, headache, or bleeding. Additionally she complains of tailbone pain, which is worsened with walking.  She takes warfarin, but states that this week it has been subtherapeutic. Additionally she endorses one episode of dark black stool. She states that her pain is 8/10. She denies headache, chest pain, nausea, diarrhea, numbness and tingling of the extremities.  The history is provided by the patient. No language interpreter was used.    Past Medical History  Diagnosis Date  . Hyperlipidemia   . Hypertension   . Anxiety   . GERD (gastroesophageal reflux disease)   . CAD (coronary artery disease)     Cath Aug. 2011 > medical therapy recommended  . Diastolic dysfunction     Grade II  . RBBB (right bundle branch block)     Chronic  . Paroxysmal atrial fibrillation   . Hypoxemic respiratory failure, chronic     uses 2.5 liters of oxygen with sleep  . COPD (chronic obstructive pulmonary disease)     GOLD 4 - PFT 06/08/10 FEV1 0.93 (62%), FEV1% 60, TLC 2.84 (69%0, DLCO 54%, +BD  . Secondary pulmonary hypertension   . Edema   . Angina   . Shortness of breath   . CHF (congestive heart failure)   . Diabetes mellitus     Type II - goal A1C is 8, to avoid hypoglycemia  . Bradycardia 08/24/2011    Past Surgical History  Procedure Date  . Rotator cuff repair 07/2005    right  .  Abdominal hysterectomy   . Cardiac stents     Family History  Problem Relation Age of Onset  . Asthma Mother   . Cancer Mother     Skin  . Heart disease Mother   . Asthma Father   . Emphysema Father   . Asthma Brother   . Asthma Brother     History  Substance Use Topics  . Smoking status: Former Smoker -- 1.5 packs/day for 15 years    Types: Cigarettes    Quit date: 06/27/1993  . Smokeless tobacco: Never Used     Comment: Smoked 1.5 packs per day for 15 years, quit in 1995.  Marland Kitchen Alcohol Use: Yes     Comment: Occasional    OB History    Grav Para Term Preterm Abortions TAB SAB Ect Mult Living                  Review of Systems  All other systems reviewed and are negative.    Allergies  Atorvastatin; Codeine; Iohexol; Morphine; and Rosuvastatin  Home Medications   Current Outpatient Rx  Name  Route  Sig  Dispense  Refill  . IPRATROPIUM-ALBUTEROL 18-103 MCG/ACT IN AERO   Inhalation   Inhale 2 puffs into the lungs every 4 (four) hours as needed. For shortness of breath   1 Inhaler   3   . ALPRAZOLAM 0.25 MG PO TABS  Oral   Take 1 tablet (0.25 mg total) by mouth at bedtime as needed. And can take 1/2 to 1 tablet twice daily as needed for anxiety.   30 tablet   3   . AMIODARONE HCL 200 MG PO TABS   Oral   Take 200 mg by mouth every morning.          . ASPIRIN 81 MG PO TABS   Oral   Take 81 mg by mouth daily.           . BUDESONIDE-FORMOTEROL FUMARATE 160-4.5 MCG/ACT IN AERO   Inhalation   Inhale 2 puffs into the lungs 2 (two) times daily.           . FUROSEMIDE 40 MG PO TABS      TAKE  (1)  TABLET TWICE A DAY.   60 tablet   3   . INSULIN REGULAR HUMAN 100 UNIT/ML IJ SOLN      Inject into skin 3 times daily before meals per sliding scale:150 or less, no R. 151 or higher, 4 units. >200, 8 units.   10 mL   12   . ISOSORBIDE MONONITRATE ER 60 MG PO TB24   Oral   Take 60 mg by mouth every morning.         . ISOSORBIDE MONONITRATE ER 60  MG PO TB24      TAKE 1 TABLET IN MORNING   30 tablet   3   . METOPROLOL SUCCINATE ER 25 MG PO TB24   Oral   Take 0.5 tablets (12.5 mg total) by mouth daily. 1/2 tablet daily   45 tablet   3   . MIRTAZAPINE 15 MG PO TABS   Oral   Take 1 tablet (15 mg total) by mouth at bedtime.         Marland Kitchen PRESERVISION/LUTEIN PO CAPS   Oral   Take by mouth daily.           Marland Kitchen PANTOPRAZOLE SODIUM 40 MG PO TBEC   Oral   Take 40 mg by mouth every morning.          Marland Kitchen POTASSIUM CHLORIDE CRYS ER 20 MEQ PO TBCR   Oral   Take 20 mEq by mouth every morning.           Marland Kitchen TIOTROPIUM BROMIDE MONOHYDRATE 18 MCG IN CAPS   Inhalation   Place 1 capsule (18 mcg total) into inhaler and inhale daily.   30 capsule   5   . ULTIMA TEST VI STRP      USE ONE THREE TIMES DAILY   300 each   PRN   . VALUE PLUS LANCETS THIN 26G MISC      Use as directed.   200 each   11   . VITAMIN C 500 MG PO TABS   Oral   Take 500 mg by mouth daily.         . WARFARIN SODIUM 5 MG PO TABS      1 a day Except for 0.5 tab on Saturday           BP 154/51  Pulse 52  Temp 97.9 F (36.6 C) (Oral)  SpO2 94%  Physical Exam  Nursing note and vitals reviewed. Constitutional: She is oriented to person, place, and time. She appears well-developed and well-nourished.  HENT:  Head: Normocephalic and atraumatic.       No signs of contusions or deformity or trauma  Eyes: Conjunctivae normal and  EOM are normal. Pupils are equal, round, and reactive to light.  Neck: Normal range of motion. Neck supple.  Cardiovascular: Normal rate and regular rhythm.  Exam reveals no gallop and no friction rub.   No murmur heard. Pulmonary/Chest: Effort normal and breath sounds normal. No respiratory distress. She has no wheezes. She has no rales. She exhibits no tenderness.  Abdominal: Soft. Bowel sounds are normal. She exhibits no distension and no mass. There is no tenderness. There is no rebound and no guarding.    Musculoskeletal: Normal range of motion. She exhibits no edema and no tenderness.       Lumbar paraspinal muscles are tender to palpation, sacral region is tender to palpation.  Neurological: She is alert and oriented to person, place, and time.  Skin: Skin is warm and dry.  Psychiatric: She has a normal mood and affect. Her behavior is normal. Judgment and thought content normal.    ED Course  Procedures (including critical care time)   Labs Reviewed  URINALYSIS, ROUTINE W REFLEX MICROSCOPIC  CBC WITH DIFFERENTIAL  TROPONIN I  PROTIME-INR  BASIC METABOLIC PANEL   Dg Sacrum/coccyx  07/20/2012  *RADIOLOGY REPORT*  Clinical Data: Fall.  Low back pain.  SACRUM AND COCCYX - 2+ VIEW  Comparison: 07/10/2011  Findings: Prominent bony demineralization noted.  The contours of the sacrum below the sacroiliac joints are not well seen, with the cortex nearly completely obscured due to the bony demineralization. Accordingly, sensitivity for fracture is low.  Lower lumbar spondylosis noted.  No discrete fracture observed on the lateral projection.  There is elimination of the intervertebral disc space at L5-S1, along with vacuum disc phenomenon at L3-4 and L4-5, posterior osseous ridging at L3-4 and L4-5, and stable grade 1 anterolisthesis at L4-5.  Dense aortoiliac atherosclerotic calcification noted.  IMPRESSION:  1.  Lumbar spondylosis and degenerative disc disease. 2.  No definite sacral fracture.  Sensitivity is reduced due to bony demineralization. Followup nuclear medicine bone scan or MRI could be used for greater sensitivity. 3.  Aortoiliac atherosclerosis.   Original Report Authenticated By: Gaylyn Rong, M.D.    Dg Hip Bilateral W/pelvis  07/20/2012  *RADIOLOGY REPORT*  Clinical Data: Fall  BILATERAL HIP WITH PELVIS - 4+ VIEW  Comparison: None.  Findings: Five views bilateral hip submitted.  No acute fracture or subluxation.  Mild degenerative changes bilateral hip joints with narrowing  superior joint space.  IMPRESSION: No acute fracture or subluxation.  Mild degenerative changes bilateral hip joints.   Original Report Authenticated By: Natasha Mead, M.D.    Ct Head Wo Contrast  07/20/2012  *RADIOLOGY REPORT*  Clinical Data: Recurrent falls  CT HEAD WITHOUT CONTRAST  Technique:  Contiguous axial images were obtained from the base of the skull through the vertex without contrast.  Comparison: 07/10/2011  Findings: No skull fracture is noted.  Paranasal sinuses and mastoid air cells are unremarkable.  No intracranial hemorrhage, mass effect or midline shift.  Atherosclerotic calcifications of carotid siphon.  No acute infarction.  No mass lesion is noted on this unenhanced scan.  Mild cerebral atrophy.  IMPRESSION: No acute intracranial abnormality.  Mild cerebral atrophy.   Original Report Authenticated By: Natasha Mead, M.D.    ED ECG REPORT  I personally interpreted this EKG   Date: 07/20/2012   Rate: 52  Rhythm: normal sinus rhythm  QRS Axis: normal  Intervals: normal  ST/T Wave abnormalities: normal  Conduction Disutrbances:right bundle branch block  Narrative Interpretation: wide QRS rhythm  Old  EKG Reviewed: unchanged    1. Fall   2. Ataxia   3. Weakness       MDM  79 year old female with fall. Will obtain basic labs, and plain films, will also order CT head based on [place] head CT rules. Labs and imaging showed no remarkable source for the patient's new instability and ataxia. Patient has past medical history remarkable for TIAs, and she did have some deficits with finger to nose testing. Because of this, will keep TIA on the differential, and will have the patient admitted for TIA workup.  6:56 PM This patient has been seen by and discussed with Dr. Jeraldine Loots. I'm going to have the patient admitted for TIA workup, as the patient has new instability and has had frequent falls.    8:33 PM Patient will be admitted by Dr. Alma Friendly cone team  10.        Roxy Horseman, PA-C 07/20/12 2035  Roxy Horseman, PA-C 07/20/12 2052

## 2012-07-20 NOTE — ED Notes (Signed)
Provider at bedside

## 2012-07-20 NOTE — ED Notes (Signed)
She has had many falls for the past 3-4 days and she last fell earlier today.  She has pain in her rt and lt flanks with coccyx pain and she struck her head and she is on  Coumadin.  When she fell Tuesday she struck the back of her head and she thinks she passed out,  She has had some dark stools since the fall and she has less bowel control

## 2012-07-20 NOTE — ED Notes (Signed)
Pt states she was having angina in her arm last night and today. Pt states she is also feelt very flushed on occasion over the past few weeks and then fell down several times. Pt hit head on a door Tuesday and yesterday. Pt states her knees are "getting worse over the past few weeks." Pt also c/o constipation, then had 1 bm after that was "jet black." Pt has home health nurse. Pt states she came in today as her coccyx was hurting from one of her falls and she feels like it is broken. Currently denies any chest or arm pain. Pt vomited 1x earlier. Denies nausea currently.

## 2012-07-20 NOTE — ED Notes (Signed)
Admitting MD at bedside.

## 2012-07-21 ENCOUNTER — Observation Stay (HOSPITAL_COMMUNITY): Payer: Medicare Other

## 2012-07-21 DIAGNOSIS — G459 Transient cerebral ischemic attack, unspecified: Secondary | ICD-10-CM

## 2012-07-21 LAB — HEMOGLOBIN A1C: Mean Plasma Glucose: 180 mg/dL — ABNORMAL HIGH (ref <117)

## 2012-07-21 LAB — GLUCOSE, CAPILLARY
Glucose-Capillary: 117 mg/dL — ABNORMAL HIGH (ref 70–99)
Glucose-Capillary: 152 mg/dL — ABNORMAL HIGH (ref 70–99)
Glucose-Capillary: 169 mg/dL — ABNORMAL HIGH (ref 70–99)
Glucose-Capillary: 130 mg/dL — ABNORMAL HIGH (ref 70–99)

## 2012-07-21 LAB — LIPID PANEL
Cholesterol: 198 mg/dL (ref 0–200)
HDL: 67 mg/dL (ref >39)
Total CHOL/HDL Ratio: 3 RATIO
Triglycerides: 95 mg/dL (ref <150)

## 2012-07-21 MED ORDER — POTASSIUM CHLORIDE CRYS ER 20 MEQ PO TBCR
20.0000 meq | EXTENDED_RELEASE_TABLET | Freq: Every day | ORAL | Status: DC
  Administered 2012-07-21 – 2012-07-23 (×3): 20 meq via ORAL
  Filled 2012-07-21 (×4): qty 1

## 2012-07-21 MED ORDER — METOPROLOL SUCCINATE 12.5 MG HALF TABLET: 12.5000 mg | ORAL_TABLET | Freq: Every day | ORAL | Status: DC

## 2012-07-21 MED ORDER — METOPROLOL SUCCINATE 12.5 MG HALF TABLET
12.5000 mg | ORAL_TABLET | Freq: Every day | ORAL | Status: DC
  Administered 2012-07-21 – 2012-07-22 (×2): 12.5 mg via ORAL
  Filled 2012-07-21 (×3): qty 1

## 2012-07-21 MED ORDER — ALPRAZOLAM 0.25 MG PO TABS: 0.2500 mg | ORAL_TABLET | Freq: Every evening | ORAL | Status: DC | PRN

## 2012-07-21 MED ORDER — BUDESONIDE-FORMOTEROL FUMARATE 160-4.5 MCG/ACT IN AERO
2.0000 | INHALATION_SPRAY | Freq: Two times a day (BID) | RESPIRATORY_TRACT | Status: DC
  Administered 2012-07-21 – 2012-07-23 (×4): 2 via RESPIRATORY_TRACT
  Filled 2012-07-21: qty 6

## 2012-07-21 MED ORDER — TIOTROPIUM BROMIDE MONOHYDRATE 18 MCG IN CAPS
18.0000 ug | ORAL_CAPSULE | Freq: Every day | RESPIRATORY_TRACT | Status: DC
  Administered 2012-07-22 – 2012-07-23 (×2): 18 ug via RESPIRATORY_TRACT
  Filled 2012-07-21: qty 5

## 2012-07-21 MED ORDER — WARFARIN SODIUM 5 MG PO TABS
5.0000 mg | ORAL_TABLET | Freq: Once | ORAL | Status: AC
  Administered 2012-07-21: 5 mg via ORAL
  Filled 2012-07-21 (×2): qty 1

## 2012-07-21 MED ORDER — WARFARIN - PHARMACIST DOSING INPATIENT
Freq: Every day | Status: DC
  Administered 2012-07-21: 18:00:00

## 2012-07-21 MED ORDER — ISOSORBIDE MONONITRATE ER 60 MG PO TB24
60.0000 mg | ORAL_TABLET | Freq: Every day | ORAL | Status: DC
  Administered 2012-07-22 – 2012-07-23 (×2): 60 mg via ORAL
  Filled 2012-07-21 (×3): qty 1

## 2012-07-21 MED ORDER — INSULIN GLARGINE 100 UNIT/ML ~~LOC~~ SOLN
10.0000 [IU] | Freq: Every day | SUBCUTANEOUS | Status: DC
  Administered 2012-07-21: 10 [IU] via SUBCUTANEOUS

## 2012-07-21 MED ORDER — AMIODARONE HCL 200 MG PO TABS
200.0000 mg | ORAL_TABLET | Freq: Every day | ORAL | Status: DC
  Administered 2012-07-21 – 2012-07-23 (×3): 200 mg via ORAL
  Filled 2012-07-21 (×3): qty 1

## 2012-07-21 MED ORDER — IPRATROPIUM-ALBUTEROL 18-103 MCG/ACT IN AERO
2.0000 | INHALATION_SPRAY | RESPIRATORY_TRACT | Status: DC | PRN
  Administered 2012-07-22: 2 via RESPIRATORY_TRACT
  Filled 2012-07-21: qty 14.7

## 2012-07-21 MED ORDER — WARFARIN SODIUM 2.5 MG PO TABS: 2.5000 mg | ORAL_TABLET | ORAL | Status: DC

## 2012-07-21 NOTE — ED Provider Notes (Signed)
On my exam this patient was in no distress, but given her history of new disequilibrium, new weakness, and her history of prior TIA, the patient was admitted for further evaluation and management.  The patient's note was well executed by the physician assistant.  Gerhard Munch, MD 07/21/12 [phone]

## 2012-07-21 NOTE — Progress Notes (Addendum)
[facility] PROGRESS NOTE  Assessment/Plan: TIA /ATaxia/frequen falls: - MRI, carotid doppler and 2d echo pending. - ASA, statins. - PT/ot consult, speech evaluation. - HbgA1c pending.  DM (01/11/2010) - SSI, HbgA1c.  DIASTOLIC DYSFUNCTION (06/07/2010) - stable.  COPD (chronic obstructive pulmonary disease) (06/16/2010) - stable.  HTN (hypertension): - allow permissive HTN goal <180/100.   Paroxysmal atrial fibrillation (07/20/2012) - INR sub thx, rate controlled.  Code Status: FULL CODE  Family Communication: Daughter at Bedside  Disposition Plan: TBA    Consultants:  none  Procedures:  MRI  ECHO  Carotid doppler  Antibiotics:  HPI/Subjective: No complains  Objective: Filed Vitals:   07/20/12 2000 07/20/12 2030 07/20/12 2134 07/21/12 0000  BP: 131/64 164/62 106/64 148/73  Pulse: 55 55 50 58  Temp:   97.6 F (36.4 C) 97.9 F (36.6 C)  TempSrc:   Oral Oral  Resp: 17 17 18 18   Height:   5' (1.524 m)   Weight:   74.481 kg (164 lb 3.2 oz)   SpO2: 97% 97% 94% 99%   No intake or output data in the 24 hours ending 07/21/12 0814 Filed Weights   07/20/12 2134  Weight: 74.481 kg (164 lb 3.2 oz)     Data Reviewed: Basic Metabolic Panel:  Lab 07/20/12 1610 07/20/12 1643  NA -- 140  K -- 4.7  CL -- 101  CO2 -- 25  GLUCOSE -- 194*  BUN -- 28*  CREATININE 1.34* 1.37*  CALCIUM -- 9.6  MG -- --  PHOS -- --   Liver Function Tests: No results found for this basename: AST:5,ALT:5,ALKPHOS:5,BILITOT:5,PROT:5,ALBUMIN:5 in the last 168 hours No results found for this basename: LIPASE:5,AMYLASE:5 in the last 168 hours No results found for this basename: AMMONIA:5 in the last 168 hours CBC:  Lab 07/20/12 2202 07/20/12 1643  WBC 8.5 9.1  NEUTROABS -- 6.8  HGB 10.9* 11.5*  HCT 34.3* 36.1  MCV 94.5 94.8  PLT 263 306   Cardiac Enzymes:  Lab 07/20/12 1643  CKTOTAL --  CKMB --  CKMBINDEX --  TROPONINI <0.30   BNP (last 3  results)  Basename 07/20/12 1643 08/24/11 0544 08/23/11 1041  PROBNP 1039.0* 808.6* 1536.0*   CBG:  Lab 07/21/12 0731 07/20/12 2143  GLUCAP 117* 150*    No results found for this or any previous visit (from the past 240 hour(s)).   Studies: Dg Sacrum/coccyx  07/20/2012  *RADIOLOGY REPORT*  Clinical Data: Fall.  Low back pain.  SACRUM AND COCCYX - 2+ VIEW  Comparison: 07/10/2011  Findings: Prominent bony demineralization noted.  The contours of the sacrum below the sacroiliac joints are not well seen, with the cortex nearly completely obscured due to the bony demineralization. Accordingly, sensitivity for fracture is low.  Lower lumbar spondylosis noted.  No discrete fracture observed on the lateral projection.  There is elimination of the intervertebral disc space at L5-S1, along with vacuum disc phenomenon at L3-4 and L4-5, posterior osseous ridging at L3-4 and L4-5, and stable grade 1 anterolisthesis at L4-5.  Dense aortoiliac atherosclerotic calcification noted.  IMPRESSION:  1.  Lumbar spondylosis and degenerative disc disease. 2.  No definite sacral fracture.  Sensitivity is reduced due to bony demineralization. Followup nuclear medicine bone scan or MRI could be used for greater sensitivity. 3.  Aortoiliac atherosclerosis.   Original Report Authenticated By: Gaylyn Rong, M.D.    Dg Hip Bilateral W/pelvis  07/20/2012  *RADIOLOGY REPORT*  Clinical Data: Fall  BILATERAL HIP WITH PELVIS - 4+ VIEW  Comparison: None.  Findings: Five views bilateral hip submitted.  No acute fracture or subluxation.  Mild degenerative changes bilateral hip joints with narrowing superior joint space.  IMPRESSION: No acute fracture or subluxation.  Mild degenerative changes bilateral hip joints.   Original Report Authenticated By: Natasha Mead, M.D.    Ct Head Wo Contrast  07/20/2012  *RADIOLOGY REPORT*  Clinical Data: Recurrent falls  CT HEAD WITHOUT CONTRAST  Technique:  Contiguous axial images were obtained  from the base of the skull through the vertex without contrast.  Comparison: 07/10/2011  Findings: No skull fracture is noted.  Paranasal sinuses and mastoid air cells are unremarkable.  No intracranial hemorrhage, mass effect or midline shift.  Atherosclerotic calcifications of carotid siphon.  No acute infarction.  No mass lesion is noted on this unenhanced scan.  Mild cerebral atrophy.  IMPRESSION: No acute intracranial abnormality.  Mild cerebral atrophy.   Original Report Authenticated By: Natasha Mead, M.D.     Scheduled Meds:   . aspirin  325 mg Oral Daily  . enoxaparin (LOVENOX) injection  30 mg Subcutaneous Q24H  . insulin aspart  0-5 Units Subcutaneous QHS  . insulin aspart  0-9 Units Subcutaneous TID WC  . sodium chloride  3 mL Intravenous Q12H   Continuous Infusions:    Marinda Elk  [facility] Pager [phone]. If 8PM-8AM, please contact night-coverage at [email or web address], password [facility] 07/21/2012, 8:14 AM  LOS: 1 day

## 2012-07-21 NOTE — Progress Notes (Signed)
Pt has completed, MRI brain w/o cm Mra head w/o cm and MRI L spine and sacrum w/o cm. Pt is extremely anxious. Had to coax pt into completing sacrum MRI. Being pt has no h/o L spine surg or CA; I asked pt if she wanted to return if contrast is still warranted after reading of w/o cm studies. Pt was c/o chest discomfort ;lbp and extreme being very anxious .

## 2012-07-21 NOTE — Progress Notes (Signed)
  Echocardiogram 2D Echocardiogram has been performed.  Zniya Cottone FRANCES 07/21/2012, 12:40 PM

## 2012-07-21 NOTE — Progress Notes (Signed)
*  PRELIMINARY RESULTS* Vascular Ultrasound Carotid Duplex (Doppler) has been completed.  Preliminary findings: Right= 60-79% ICA stenosis. Left = 40-59% ICA stenosis.  Antegrade vertebral flow.  Farrel Demark, RDMS, RVT  07/21/2012, 11:15 AM

## 2012-07-21 NOTE — Care Management Utilization Note (Signed)
UR completed 

## 2012-07-21 NOTE — Evaluation (Signed)
Physical Therapy Evaluation Patient Details Name: [name] MRN: [id number] DOB: 01/02/1928 Today's Date: 07/21/2012 Time: 2130-8657 PT Time Calculation (min): 47 min  PT Assessment / Plan / Recommendation Clinical Impression  Pt admitted with dizziness and frequent falls over last week, 2 outside and 3 inside. Pt with CT and MRI (-) and complex history socially. Pt lived with dgtr until 1.5 years ago with lots of issues between pt and dgtr but since that time living on her own without caregivers nearby. Pt educated for safety concerns regarding lack of consistent O2 use at home, lack of rollator use in home, frequent falls and mobility with dizziness. Pt very resistant to all options offered from ALF to HHPT with modification of home envirronment and ST-SNF. Pt not open to changing home environment, adamant  for no ALF and somewhat open to ST-SNF depending on the facility and agreeable to HHPT. Family unable to provide 24hr care and pt demonstrates decreased safety awareness and high fall risk and would benefit from ST-SNF to address balance deficits, frequent falls, safe use of DME and per dgtr to give her time to change home environment to be safer for pt. Pt and dgtr very aggressive in conversation with each other throughout eval with pt adamant she has no deficits and dgtr trying to convince pt of safety concerns. Lengthy discussion with pt and dgtr regarding all of the above. Pt will benefit from acute therapy to maximize balance, safety awareness and education to increase pt independence.     PT Assessment  Patient needs continued PT services    Follow Up Recommendations  SNF;Supervision for mobility/OOB;Home health PT (ST-SNF would be beneficial, if pt refuses HHPT)    Does the patient have the potential to tolerate intense rehabilitation      Barriers to Discharge Decreased caregiver support      Equipment Recommendations  Other (comment)    Recommendations for Other Services OT  consult   Frequency Min 3X/week    Precautions / Restrictions Precautions Precautions: Fall   Pertinent Vitals/Pain No pain 2L O2 throughout      Mobility  Bed Mobility Bed Mobility: Supine to Sit;Sitting - Scoot to Edge of Bed Supine to Sit: 5: Supervision;With rails;HOB flat Sitting - Scoot to Edge of Bed: 6: Modified independent (Device/Increase time) Transfers Transfers: Sit to Stand;Stand to Sit Sit to Stand: 5: Supervision;From bed Stand to Sit: 5: Supervision;To chair/3-in-1;With armrests Details for Transfer Assistance: cueing for safety and hand placement, pt aggravated with dgtr and impulsive with transfers Ambulation/Gait Ambulation/Gait Assistance: 5: Supervision Ambulation Distance (Feet): 200 Feet Assistive device: Rollator Ambulation/Gait Assistance Details: Pt with quick pace, wide turns and keeping self too posterior to rollator. Cueing to decrease speed  Gait Pattern: Step-through pattern;Decreased stride length;Trunk flexed Gait velocity: too quick for pt safety Stairs: No    Shoulder Instructions     Exercises     PT Diagnosis: Difficulty walking  PT Problem List: Decreased balance;Decreased activity tolerance;Decreased safety awareness;Decreased knowledge of use of DME PT Treatment Interventions: Gait training;DME instruction;Functional mobility training;Therapeutic exercise;Therapeutic activities;Balance training;Patient/family education   PT Goals Acute Rehab PT Goals PT Goal Formulation: With patient/family Time For Goal Achievement: 07/28/12 Potential to Achieve Goals: Good Pt will go Sit to Stand: with modified independence PT Goal: Sit to Stand - Progress: Goal set today Pt will go Stand to Sit: with modified independence PT Goal: Stand to Sit - Progress: Goal set today Pt will Ambulate: >150 feet;with modified independence;with cane (due to use of  cane in home) PT Goal: Ambulate - Progress: Goal set today Additional Goals Additional Goal  #1: Pt will complete DGI with score of 20 or greater PT Goal: Additional Goal #1 - Progress: Goal set today  Visit Information  Last PT Received On: 07/21/12 Assistance Needed: +1    Subjective Data  Subjective: "I don't want to move any of my furniture" "2 of the falls weren't my fault, that man scared my dog" Patient Stated Goal: return home   Prior Functioning  Home Living Lives With: Alone Available Help at Discharge: Family;Available PRN/intermittently Type of Home: Apartment Home Access: Level entry Home Layout: One level Bathroom Shower/Tub: Engineer, manufacturing systems: Standard Home Adaptive Equipment: Dan Humphreys - four wheeled;Straight cane;Shower chair with back Prior Function Level of Independence: Independent with assistive device(s);Needs assistance Needs Assistance: Light Housekeeping Light Housekeeping: Moderate Able to Take Stairs?: No Driving: No Vocation: Retired Comments: pt with macular degeneration. Pt is supposed to wear O2 24hrs but states she doesn't do that all the time because it gets in her way. She also only uses rollator when out of house because not enough room in the house and uses her cane in the home Communication Communication: HOH    Cognition  Overall Cognitive Status: Impaired Area of Impairment: Safety/judgement Arousal/Alertness: Awake/alert Orientation Level: Appears intact for tasks assessed Behavior During Session: [facility] for tasks performed Safety/Judgement: Decreased safety judgement for tasks assessed Safety/Judgement - Other Comments: Pt quick to blame or justify falls on others and resistant to needing to change any home environment or mobility practices to make her life safer    Extremity/Trunk Assessment Right Lower Extremity Assessment RLE ROM/Strength/Tone: [facility] for tasks assessed Left Lower Extremity Assessment LLE ROM/Strength/Tone: [facility] for tasks assessed Trunk Assessment Trunk Assessment: Normal   Balance Standardized  Balance Assessment Standardized Balance Assessment: Dynamic Gait Index  End of Session PT - End of Session Activity Tolerance: Patient tolerated treatment well Patient left: in chair;with call bell/phone within reach;with family/visitor present;with nursing in room (nurse tech and nursing student in room)  GP     Delorse Lek 07/21/2012, 5:18 PM  Delaney Meigs, PT [phone]

## 2012-07-21 NOTE — Progress Notes (Signed)
Pt daughter indicating that her mother's hearing has gotten significantly worse. She is requesting for it to be addressed. Thanks ! [name] RN

## 2012-07-21 NOTE — Progress Notes (Signed)
ANTICOAGULATION CONSULT NOTE - Initial Consult  Pharmacy Consult for Coumadin  Indication: atrial fibrillation  Allergies  Allergen Reactions  . Atorvastatin Other (See Comments)     muscle aches  . Codeine Nausea And Vomiting  . Iohexol Other (See Comments)     Desc: unknown reaction; allergic to iodine and contrast   . Morphine Nausea And Vomiting  . Rosuvastatin Other (See Comments)    muscle aches at high doses, held as of 12/2010 due to aches    Patient Measurements: Height: 5' (152.4 cm) Weight: 164 lb 3.2 oz (74.481 kg) IBW/kg (Calculated) : 45.5    Vital Signs: Temp: 97.9 F (36.6 C) (01/25 0000) Temp src: Oral (01/25 0000) BP: 148/73 mmHg (01/25 0000) Pulse Rate: 58  (01/25 0000)  Labs:  Basename 07/20/12 2202 07/20/12 1643  HGB 10.9* 11.5*  HCT 34.3* 36.1  PLT 263 306  APTT -- --  LABPROT -- 17.1*  INR -- 1.43  HEPARINUNFRC -- --  CREATININE 1.34* 1.37*  CKTOTAL -- --  CKMB -- --  TROPONINI -- <0.30    Estimated Creatinine Clearance: 29.2 ml/min (by C-G formula based on Cr of 1.34).   Medical History: Past Medical History  Diagnosis Date  . Hyperlipidemia   . Hypertension   . Anxiety   . GERD (gastroesophageal reflux disease)   . CAD (coronary artery disease)     Cath Aug. 2011 > medical therapy recommended  . Diastolic dysfunction     Grade II  . RBBB (right bundle branch block)     Chronic  . Paroxysmal atrial fibrillation   . Hypoxemic respiratory failure, chronic     uses 2.5 liters of oxygen with sleep  . COPD (chronic obstructive pulmonary disease)     GOLD 4 - PFT 06/08/10 FEV1 0.93 (62%), FEV1% 60, TLC 2.84 (69%0, DLCO 54%, +BD  . Secondary pulmonary hypertension   . Edema   . Angina   . Shortness of breath   . CHF (congestive heart failure)   . Diabetes mellitus     Type II - goal A1C is 8, to avoid hypoglycemia  . Bradycardia 08/24/2011    Medications:  Scheduled:    . amiodarone  200 mg Oral Daily  . aspirin  325 mg  Oral Daily  . budesonide-formoterol  2 puff Inhalation BID  . enoxaparin (LOVENOX) injection  30 mg Subcutaneous Q24H  . [COMPLETED]  HYDROmorphone (DILAUDID) injection  0.5 mg Intravenous Once  . insulin aspart  0-5 Units Subcutaneous QHS  . insulin aspart  0-9 Units Subcutaneous TID WC  . isosorbide mononitrate  60 mg Oral Daily  . metoprolol succinate  12.5 mg Oral Daily  . potassium chloride SA  20 mEq Oral Daily  . sodium chloride  3 mL Intravenous Q12H  . tiotropium  18 mcg Inhalation Daily  . [DISCONTINUED] metoprolol succinate  12.5 mg Oral Daily  . [DISCONTINUED] warfarin  2.5-5 mg Oral See admin instructions    Assessment: 79 yr old female admitted with ataxia and falls on coumadin for afib.    Goal of Therapy:  INR 2-3 Monitor platelets by anticoagulation protocol: Yes   Plan:  Coumadin 5mg  po x 1 dose tonight. Daily PT/INR.  Wendie Simmer, PharmD, BCPS Clinical Pharmacist  Pager: [phone]

## 2012-07-22 DIAGNOSIS — W19XXXA Unspecified fall, initial encounter: Secondary | ICD-10-CM

## 2012-07-22 DIAGNOSIS — S3210XA Unspecified fracture of sacrum, initial encounter for closed fracture: Secondary | ICD-10-CM | POA: Diagnosis present

## 2012-07-22 LAB — GLUCOSE, CAPILLARY
Glucose-Capillary: 132 mg/dL — ABNORMAL HIGH (ref 70–99)
Glucose-Capillary: 130 mg/dL — ABNORMAL HIGH (ref 70–99)
Glucose-Capillary: 219 mg/dL — ABNORMAL HIGH (ref 70–99)

## 2012-07-22 LAB — HEMOGLOBIN A1C
Hgb A1c MFr Bld: 8.3 % — ABNORMAL HIGH (ref <5.7)
Mean Plasma Glucose: 192 mg/dL — ABNORMAL HIGH (ref <117)

## 2012-07-22 LAB — PROTIME-INR
INR: 1.28 (ref 0.00–1.49)
Prothrombin Time: 15.7 seconds — ABNORMAL HIGH (ref 11.6–15.2)

## 2012-07-22 MED ORDER — PANTOPRAZOLE SODIUM 40 MG PO TBEC
40.0000 mg | DELAYED_RELEASE_TABLET | Freq: Every day | ORAL | Status: DC
  Administered 2012-07-22 – 2012-07-23 (×2): 40 mg via ORAL
  Filled 2012-07-22 (×2): qty 1

## 2012-07-22 MED ORDER — METHOCARBAMOL 500 MG PO TABS
500.0000 mg | ORAL_TABLET | Freq: Three times a day (TID) | ORAL | Status: DC | PRN
  Filled 2012-07-22: qty 1

## 2012-07-22 MED ORDER — INSULIN GLARGINE 100 UNIT/ML ~~LOC~~ SOLN
13.0000 [IU] | Freq: Every day | SUBCUTANEOUS | Status: DC
  Administered 2012-07-22: 13 [IU] via SUBCUTANEOUS

## 2012-07-22 MED ORDER — HYDROCODONE-ACETAMINOPHEN 5-325 MG PO TABS
1.0000 | ORAL_TABLET | ORAL | Status: DC | PRN
  Administered 2012-07-22 – 2012-07-23 (×3): 1 via ORAL
  Filled 2012-07-22 (×3): qty 1

## 2012-07-22 MED ORDER — WARFARIN SODIUM 5 MG PO TABS
5.0000 mg | ORAL_TABLET | Freq: Once | ORAL | Status: AC
  Administered 2012-07-22: 5 mg via ORAL
  Filled 2012-07-22: qty 1

## 2012-07-22 MED ORDER — FUROSEMIDE 40 MG PO TABS
40.0000 mg | ORAL_TABLET | Freq: Two times a day (BID) | ORAL | Status: DC
  Administered 2012-07-22 – 2012-07-23 (×3): 40 mg via ORAL
  Filled 2012-07-22 (×5): qty 1

## 2012-07-22 NOTE — Clinical Social Work Placement (Signed)
Clinical Social Work Department CLINICAL SOCIAL WORK PLACEMENT NOTE 07/22/2012  Patient:  OPHIA, SHAMOON  Account Number:  [id number] Admit date:  07/20/2012  Clinical Social Worker:  Oswaldo Done  Date/time:  07/22/2012 01:30 PM  Clinical Social Work is seeking post-discharge placement for this patient at the following level of care:   SKILLED NURSING   (*CSW will update this form in Epic as items are completed)   07/22/2012  Patient/family provided with [facility] Department of Clinical Social Work's list of facilities offering this level of care within the geographic area requested by the patient (or if unable, by the patient's family).  07/22/2012  Patient/family informed of their freedom to choose among providers that offer the needed level of care, that participate in Medicare, Medicaid or managed care program needed by the patient, have an available bed and are willing to accept the patient.  07/22/2012  Patient/family informed of [facility]' ownership interest in [facility], as well as of the fact that they are under no obligation to receive care at this facility.  PASARR submitted to [employer] on  PASARR number received from [employer] on   FL2 transmitted to all facilities in geographic area requested by pt/family on  07/22/2012 FL2 transmitted to all facilities within larger geographic area on   Patient informed that his/her managed care company has contracts with or will negotiate with  certain facilities, including the following:     Patient/family informed of bed offers received:   Patient chooses bed at  Physician recommends and patient chooses bed at    Patient to be transferred to  on   Patient to be transferred to facility by   The following physician request were entered in Epic:   Additional Comments:  Ricke Hey, Theresia Majors [phone] (weekend)

## 2012-07-22 NOTE — Progress Notes (Signed)
ANTICOAGULATION CONSULT NOTE - Initial Consult  Pharmacy Consult for Coumadin  Indication: atrial fibrillation  Allergies  Allergen Reactions  . Atorvastatin Other (See Comments)     muscle aches  . Codeine Nausea And Vomiting  . Iohexol Other (See Comments)     Desc: unknown reaction; allergic to iodine and contrast   . Morphine Nausea And Vomiting  . Rosuvastatin Other (See Comments)    muscle aches at high doses, held as of 12/2010 due to aches    Patient Measurements: Height: 5' (152.4 cm) Weight: 164 lb 3.2 oz (74.481 kg) IBW/kg (Calculated) : 45.5    Vital Signs: Temp: 98.8 F (37.1 C) (01/26 0400) BP: 149/65 mmHg (01/26 0400) Pulse Rate: 64  (01/26 0400)  Labs:  Basename 07/22/12 0510 07/20/12 2202 07/20/12 1643  HGB -- 10.9* 11.5*  HCT -- 34.3* 36.1  PLT -- 263 306  APTT -- -- --  LABPROT 15.7* -- 17.1*  INR 1.28 -- 1.43  HEPARINUNFRC -- -- --  CREATININE -- 1.34* 1.37*  CKTOTAL -- -- --  CKMB -- -- --  TROPONINI -- -- <0.30    Estimated Creatinine Clearance: 29.2 ml/min (by C-G formula based on Cr of 1.34).   Medical History: Past Medical History  Diagnosis Date  . Hyperlipidemia   . Hypertension   . Anxiety   . GERD (gastroesophageal reflux disease)   . CAD (coronary artery disease)     Cath Aug. 2011 > medical therapy recommended  . Diastolic dysfunction     Grade II  . RBBB (right bundle branch block)     Chronic  . Paroxysmal atrial fibrillation   . Hypoxemic respiratory failure, chronic     uses 2.5 liters of oxygen with sleep  . COPD (chronic obstructive pulmonary disease)     GOLD 4 - PFT 06/08/10 FEV1 0.93 (62%), FEV1% 60, TLC 2.84 (69%0, DLCO 54%, +BD  . Secondary pulmonary hypertension   . Edema   . Angina   . Shortness of breath   . CHF (congestive heart failure)   . Diabetes mellitus     Type II - goal A1C is 8, to avoid hypoglycemia  . Bradycardia 08/24/2011    Medications:  Scheduled:     . amiodarone  200 mg Oral  Daily  . aspirin  325 mg Oral Daily  . budesonide-formoterol  2 puff Inhalation BID  . enoxaparin (LOVENOX) injection  30 mg Subcutaneous Q24H  . insulin aspart  0-5 Units Subcutaneous QHS  . insulin aspart  0-9 Units Subcutaneous TID WC  . insulin glargine  10 Units Subcutaneous QHS  . isosorbide mononitrate  60 mg Oral Daily  . metoprolol succinate  12.5 mg Oral Daily  . potassium chloride SA  20 mEq Oral Daily  . sodium chloride  3 mL Intravenous Q12H  . tiotropium  18 mcg Inhalation Daily  . [COMPLETED] warfarin  5 mg Oral ONCE-1800  . Warfarin - Pharmacist Dosing Inpatient   Does not apply q1800    Assessment: 79 yr old female admitted with ataxia and falls on coumadin for afib.  INR is subtherapeutic today at 1.28.  Patients PTA dose is 5mg  daily except 2.5mg  on Saturdays.  Patient is also on lovenox 30mg  daily.   Goal of Therapy:  INR 2-3 Monitor platelets by anticoagulation protocol: Yes   Plan:  Coumadin 5mg  po x 1 dose tonight. Daily PT/INR.  Wendie Simmer, PharmD, BCPS Clinical Pharmacist  Pager: [phone]

## 2012-07-22 NOTE — Clinical Social Work Psychosocial (Signed)
Clinical Social Work Department BRIEF PSYCHOSOCIAL ASSESSMENT 07/22/2012  Patient:  [name], [name]     Account Number:  [id number]     Admit date:  07/20/2012  Clinical Social Worker:  Oswaldo Done  Date/Time:  07/22/2012 12:48 PM  Referred by:  Physician  Date Referred:  07/21/2012 Referred for  SNF Placement   Other Referral:   Interview type:  Patient Other interview type:   Daughter, [name], also present at bedside.    PSYCHOSOCIAL DATA Living Status:  ALONE Admitted from facility:   Level of care:   Primary support name:  [name] Primary support relationship to patient:  CHILD, ADULT Degree of support available:   Adequate    CURRENT CONCERNS Current Concerns  Post-Acute Placement   Other Concerns:    SOCIAL WORK ASSESSMENT / PLAN CSW consulted by physician regarding SNF placement. Interviewed patient and daughter at bedside. Both agreeable to plan. Would like [place] to make referral to all [facility]. Prefer [facility] if possible.   Assessment/plan status:  Information/Referral to [facility] Other assessment/ plan:   Information/referral to community resources:   [employer]    PATIENT'S/FAMILY'S RESPONSE TO PLAN OF CARE: Patient and daughter thanked CSW for list of facilities and information regarding Medicare coverage of SNF stay. Weekday CSW will follow up with daughter and patient.        Ricke Hey, [place] [phone] (weekend)

## 2012-07-22 NOTE — Progress Notes (Addendum)
[facility] PROGRESS NOTE  Assessment/Plan: TIA /Ataxia/frequen falls: - carotid doppler as below, will need a vascular surgery consult as an outpatient. - ASA, statins. - PT/ot consult: SNF. - HbgA1c 8.3  Acute sacral fracture: - MRI of sacrum showed : Nondisplaced transverse fracture at the sacrococcygeal junction - robaxin andnarcotics for pain. - INR subthx, repeat CBC in am to follow Hbg.  DM (01/11/2010) - add Lantuss  SSI,  - HbgA1c 8.3  DIASTOLIC DYSFUNCTION (06/07/2010) - stable. - resume lasix, cont metoprolol.  COPD (chronic obstructive pulmonary disease) (06/16/2010) - stable.  HTN (hypertension): - Bp improve today, ? High 2/2 to pain.   Paroxysmal atrial fibrillation (07/20/2012) - INR sub thx, rate controlled.  Code Status: FULL CODE  Family Communication: Daughter at Bedside  Disposition Plan: TBA    Consultants:  none  Procedures: MRI 1.26.2014: No acute intracranial abnormality. ECHO: ef 55-60% grade  diastolic dysfunction. Carotid doppler: Right= 60-79% ICA stenosis. Left = 40-59% ICA stenosis.  Antibiotics:  HPI/Subjective: - Having pain when moving on her back. - after she worked with PT she was in a lot of pain. Objective: Filed Vitals:   07/21/12 2045 07/21/12 2200 07/22/12 0000 07/22/12 0400  BP:  168/70 174/70 149/65  Pulse:   62 64  Temp:   98.3 F (36.8 C) 98.8 F (37.1 C)  TempSrc:      Resp:   16 18  Height:      Weight:      SpO2: 95%  93% 95%    Intake/Output Summary (Last 24 hours) at 07/22/12 0902 Last data filed at 07/22/12 0423  Gross per 24 hour  Intake    123 ml  Output      0 ml  Net    123 ml   Filed Weights   07/20/12 2134  Weight: 74.481 kg (164 lb 3.2 oz)     Data Reviewed: Basic Metabolic Panel:  Lab 07/20/12 1610 07/20/12 1643  NA -- 140  K -- 4.7  CL -- 101  CO2 -- 25  GLUCOSE -- 194*  BUN -- 28*  CREATININE 1.34* 1.37*  CALCIUM -- 9.6  MG -- --  PHOS -- --   Liver Function  Tests: No results found for this basename: AST:5,ALT:5,ALKPHOS:5,BILITOT:5,PROT:5,ALBUMIN:5 in the last 168 hours No results found for this basename: LIPASE:5,AMYLASE:5 in the last 168 hours No results found for this basename: AMMONIA:5 in the last 168 hours CBC:  Lab 07/20/12 2202 07/20/12 1643  WBC 8.5 9.1  NEUTROABS -- 6.8  HGB 10.9* 11.5*  HCT 34.3* 36.1  MCV 94.5 94.8  PLT 263 306   Cardiac Enzymes:  Lab 07/20/12 1643  CKTOTAL --  CKMB --  CKMBINDEX --  TROPONINI <0.30   BNP (last 3 results)  Basename 07/20/12 1643 08/24/11 0544 08/23/11 1041  PROBNP 1039.0* 808.6* 1536.0*   CBG:  Lab 07/22/12 0749 07/21/12 2138 07/21/12 1640 07/21/12 1120 07/21/12 0731  GLUCAP 132* 130* 169* 152* 117*    No results found for this or any previous visit (from the past 240 hour(s)).   Studies: Dg Sacrum/coccyx  07/20/2012  *RADIOLOGY REPORT*  Clinical Data: Fall.  Low back pain.  SACRUM AND COCCYX - 2+ VIEW  Comparison: 07/10/2011  Findings: Prominent bony demineralization noted.  The contours of the sacrum below the sacroiliac joints are not well seen, with the cortex nearly completely obscured due to the bony demineralization. Accordingly, sensitivity for fracture is low.  Lower lumbar spondylosis noted.  No discrete  fracture observed on the lateral projection.  There is elimination of the intervertebral disc space at L5-S1, along with vacuum disc phenomenon at L3-4 and L4-5, posterior osseous ridging at L3-4 and L4-5, and stable grade 1 anterolisthesis at L4-5.  Dense aortoiliac atherosclerotic calcification noted.  IMPRESSION:  1.  Lumbar spondylosis and degenerative disc disease. 2.  No definite sacral fracture.  Sensitivity is reduced due to bony demineralization. Followup nuclear medicine bone scan or MRI could be used for greater sensitivity. 3.  Aortoiliac atherosclerosis.   Original Report Authenticated By: Gaylyn Rong, M.D.    Dg Hip Bilateral W/pelvis  07/20/2012   *RADIOLOGY REPORT*  Clinical Data: Fall  BILATERAL HIP WITH PELVIS - 4+ VIEW  Comparison: None.  Findings: Five views bilateral hip submitted.  No acute fracture or subluxation.  Mild degenerative changes bilateral hip joints with narrowing superior joint space.  IMPRESSION: No acute fracture or subluxation.  Mild degenerative changes bilateral hip joints.   Original Report Authenticated By: Natasha Mead, M.D.    Ct Head Wo Contrast  07/20/2012  *RADIOLOGY REPORT*  Clinical Data: Recurrent falls  CT HEAD WITHOUT CONTRAST  Technique:  Contiguous axial images were obtained from the base of the skull through the vertex without contrast.  Comparison: 07/10/2011  Findings: No skull fracture is noted.  Paranasal sinuses and mastoid air cells are unremarkable.  No intracranial hemorrhage, mass effect or midline shift.  Atherosclerotic calcifications of carotid siphon.  No acute infarction.  No mass lesion is noted on this unenhanced scan.  Mild cerebral atrophy.  IMPRESSION: No acute intracranial abnormality.  Mild cerebral atrophy.   Original Report Authenticated By: Natasha Mead, M.D.    Mri Brain Without Contrast  07/21/2012  *RADIOLOGY REPORT*  Clinical Data:  79 year old female with dizziness, nausea, vomiting, falls.  Comparison: Head CTs without contrast 07/20/2012.  Cervical MRI 01/26 of seven.  MRI HEAD WITHOUT CONTRAST  Technique: Multiplanar, multiecho pulse sequences of the brain and surrounding structures were obtained according to standard protocol without intravenous contrast.  Findings: No restricted diffusion to suggest acute infarction.  No midline shift, mass effect, evidence of mass lesion, ventriculomegaly, extra-axial collection or acute intracranial hemorrhage.  Cervicomedullary junction and pituitary are within normal limits.  Major intracranial vascular flow voids are preserved; dominant distal right vertebral artery.  Mild for age scattered cerebral white matter T2 and FLAIR hyperintensity.  No  cortical encephalomalacia.  Occasional chronic micro hemorrhages (left occipital lobe, left cerebellar hemisphere).  Deep gray matter nuclei and brain stem are within normal limits.  Negative for age visualized cervical spine.  Normal bone marrow signal.  Postoperative changes to the globes. Visualized paranasal sinuses and mastoids are clear.  Grossly negative visualized internal auditory structures.  Negative scalp soft tissues.  IMPRESSION: 1. No acute intracranial abnormality. 2.  Mild for age nonspecific signal changes, most commonly due to chronic small vessel disease. 3.  See MRA findings below.  MRA HEAD WITHOUT CONTRAST  Technique: Angiographic images of the Circle of Willis were obtained using MRA technique without  intravenous contrast.  Findings: Antegrade flow in the posterior circulation with dominant distal right vertebral artery.  The left vertebral artery functionally terminates in PICA.  Normal right PICA.  Patent vertebrobasilar junction.  Mild basilar artery irregularity and tortuosity without stenosis.  Fetal right PCA origin.  SCA and left PCA origin are within normal limits.  Both proximal PCAs are tortuous.  No PCA stenosis.  The left posterior communicating artery is diminutive.  Antegrade flow  in both ICA siphons.  No ICA stenosis.  Ophthalmic and posterior communicating artery origins are within normal limits.  Patent carotid termini.  MCA and ACA origins are within normal limits.  Mild irregularity and stenosis of the left ACA A1 segment. Diminutive anterior communicating artery.  Other visualized ACA branches are within normal limits.  Mild right MCA M1 segment irregularity.  Visualized right MCA branches otherwise are within normal limits.  Left MCA M1 segment is normal to the bifurcation.  At the bifurcation there is a high-grade stenosis of the origin of the posterior sylvian division (series 605 image 4).  There is mild ectasia just beyond the stenosis without discrete aneurysm.  The  downstream left M2 branches remain patent despite this finding. Other visualized left MCA branches are within normal limits.  IMPRESSION: 1.  High-grade stenosis at the left MCA bifurcation affecting the origin of the posterior M2 branch.  However, distal flow is preserved. 2.  Mild anterior circulation atherosclerosis otherwise. 3.  Mild posterior circulation tortuosity without stenosis.   Original Report Authenticated By: Erskine Speed, M.D.    Mr Lumbar Spine Wo Contrast  07/21/2012  *RADIOLOGY REPORT*  Clinical Data: 79 year old female with falls.  Dizziness, nausea and vomiting.  Bilateral lower extremity weakness.  MRI LUMBAR SPINE WITHOUT CONTRAST  Technique:  Multiplanar and multiecho pulse sequences of the lumbar spine were obtained without intravenous contrast.  Comparison: CT abdomen and pelvis 07/10/2011.  Findings: MRI sacrum performed at the same time is reported separately.  Hypoplastic ribs at T12 depicted on the comparison.  Chronic mild grade 1 anterolisthesis of L4 on L5.  Stable vertebral height and alignment since 2013.  Chronic T12 inferior endplate Schmorl's node.  Mild associated marrow edema. No acute osseous abnormality identified.   Visualized lower thoracic spinal cord is normal with conus medularis at L1-L2.  See below regarding levels of mild lower thoracic and upper lumbar spinal stenosis.  Negative paraspinal soft tissues except for subcutaneous edema. Stable renal atrophy.  Stable visualized abdominal viscera.  T8-T9:  Negative.  T9-T10:  Mild disc bulge.  Mild to moderate posterior element hypertrophy.  No significant spinal stenosis.  Mild right greater than left T9 foraminal stenosis.  T10-T11:  Mild to moderate circumferential disc osteophyte complex. Mild to moderate posterior element hypertrophy.  No significant spinal stenosis.  Mild left and moderate right T10 foraminal stenosis.  T11-T12:  Mild circumferential disc bulge.  No significant stenosis.  T12-L1:  Left eccentric  circumferential disc bulge.  Mild to moderate facet and ligament flavum hypertrophy greater on the left. Mild left lateral recess stenosis.  Moderate left T12 foraminal stenosis.  No significant spinal stenosis.  L1-L2:  Mild circumferential disc bulge.  Small left paracentral disc protrusion superimposed.  Mild bilateral L1 foraminal stenosis.  No significant spinal or lateral recess stenosis.  L2-L3:  Mild circumferential disc bulge.  Mild facet and ligament flavum hypertrophy.  Mild bilateral L2 foraminal stenosis. Borderline spinal stenosis.  L3-L4:  Mild circumferential disc bulge.  Moderate facet and ligament flavum hypertrophy.  Mild spinal and right greater than left lateral recess stenosis.  Mild left and moderate right L3 foraminal stenosis.  L4-L5:  Grade 1 anterolisthesis.  Right eccentric circumferential disc osteophyte complex.  Severe facet hypertrophy.  Moderate to severe ligament flavum hypertrophy.  Posteriorly situated synovial cyst which will not cause neural compromise.  Mild spinal stenosis. Moderate right and mild left lateral recess stenosis.  Moderate right and mild left L4 foraminal stenosis.  L5-S1:  Vestigial disc.  Mild facet hypertrophy.  No significant stenosis.  IMPRESSION: 1.  Widespread lower thoracic and lumbar disc chronic degeneration. Acute-on-chronic T12 degenerative endplate changes. 2.  Chronic grade 1 anterolisthesis at L4-L5 associated with advanced disc and facet degeneration resulting in mild spinal stenosis moderate right and mild left lateral recess and L4 foraminal stenosis. 3.  No other significant spinal stenosis.  Additional multilevel mild to moderate neural foraminal stenosis as detailed above.   Original Report Authenticated By: Erskine Speed, M.D.    Mr Sacrum/si Joints Wo Contrast  07/21/2012  *RADIOLOGY REPORT*  Clinical Data:  Dizziness and falls.  Lower extremity weakness.  MRI SACRUM WITHOUT CONTRAST  Technique:  Multiplanar and multiecho pulse sequences  of the sacrum to include the sacroiliac joints and the coccyx were obtained without intravenous contrast.  Comparison:  Radiographs from 07/20/2012; prior CT from 07/10/2011; prior MRI pelvis from 09/29/2007  Findings:  Transverse nondisplaced fracture at the sacrococcygeal junction noted with adjacent marrow edema and low-level adjacent soft tissue edema as shown on image 11 of series 10 and image 6 of series 12.  Mild findings of degenerative sacroiliitis noted.  Sigmoid diverticulosis noted without active diverticulitis.  The uterus is absent.  No sciatic notch impingement or obturator impingement. Lumbar spine findings are available under accession number [id number].  IMPRESSION:  1.  Nondisplaced transverse fracture at the sacrococcygeal junction appears acute. 2.  Sigmoid diverticulosis. 3.  Several small focal T2 hyperintense lesions along the sacroiliac joints, favoring mild chronic sacroiliitis.   Original Report Authenticated By: Gaylyn Rong, M.D.    Mr Mra Head/brain Wo Cm  07/21/2012  *RADIOLOGY REPORT*  Clinical Data:  79 year old female with dizziness, nausea, vomiting, falls.  Comparison: Head CTs without contrast 07/20/2012.  Cervical MRI 01/26 of seven.  MRI HEAD WITHOUT CONTRAST  Technique: Multiplanar, multiecho pulse sequences of the brain and surrounding structures were obtained according to standard protocol without intravenous contrast.  Findings: No restricted diffusion to suggest acute infarction.  No midline shift, mass effect, evidence of mass lesion, ventriculomegaly, extra-axial collection or acute intracranial hemorrhage.  Cervicomedullary junction and pituitary are within normal limits.  Major intracranial vascular flow voids are preserved; dominant distal right vertebral artery.  Mild for age scattered cerebral white matter T2 and FLAIR hyperintensity.  No cortical encephalomalacia.  Occasional chronic micro hemorrhages (left occipital lobe, left cerebellar hemisphere).  Deep  gray matter nuclei and brain stem are within normal limits.  Negative for age visualized cervical spine.  Normal bone marrow signal.  Postoperative changes to the globes. Visualized paranasal sinuses and mastoids are clear.  Grossly negative visualized internal auditory structures.  Negative scalp soft tissues.  IMPRESSION: 1. No acute intracranial abnormality. 2.  Mild for age nonspecific signal changes, most commonly due to chronic small vessel disease. 3.  See MRA findings below.  MRA HEAD WITHOUT CONTRAST  Technique: Angiographic images of the Circle of Willis were obtained using MRA technique without  intravenous contrast.  Findings: Antegrade flow in the posterior circulation with dominant distal right vertebral artery.  The left vertebral artery functionally terminates in PICA.  Normal right PICA.  Patent vertebrobasilar junction.  Mild basilar artery irregularity and tortuosity without stenosis.  Fetal right PCA origin.  SCA and left PCA origin are within normal limits.  Both proximal PCAs are tortuous.  No PCA stenosis.  The left posterior communicating artery is diminutive.  Antegrade flow in both ICA siphons.  No ICA stenosis.  Ophthalmic and posterior  communicating artery origins are within normal limits.  Patent carotid termini.  MCA and ACA origins are within normal limits.  Mild irregularity and stenosis of the left ACA A1 segment. Diminutive anterior communicating artery.  Other visualized ACA branches are within normal limits.  Mild right MCA M1 segment irregularity.  Visualized right MCA branches otherwise are within normal limits.  Left MCA M1 segment is normal to the bifurcation.  At the bifurcation there is a high-grade stenosis of the origin of the posterior sylvian division (series 605 image 4).  There is mild ectasia just beyond the stenosis without discrete aneurysm.  The downstream left M2 branches remain patent despite this finding. Other visualized left MCA branches are within normal  limits.  IMPRESSION: 1.  High-grade stenosis at the left MCA bifurcation affecting the origin of the posterior M2 branch.  However, distal flow is preserved. 2.  Mild anterior circulation atherosclerosis otherwise. 3.  Mild posterior circulation tortuosity without stenosis.   Original Report Authenticated By: Erskine Speed, M.D.     Scheduled Meds:    . amiodarone  200 mg Oral Daily  . aspirin  325 mg Oral Daily  . budesonide-formoterol  2 puff Inhalation BID  . enoxaparin (LOVENOX) injection  30 mg Subcutaneous Q24H  . insulin aspart  0-5 Units Subcutaneous QHS  . insulin aspart  0-9 Units Subcutaneous TID WC  . insulin glargine  10 Units Subcutaneous QHS  . isosorbide mononitrate  60 mg Oral Daily  . metoprolol succinate  12.5 mg Oral Daily  . potassium chloride SA  20 mEq Oral Daily  . sodium chloride  3 mL Intravenous Q12H  . tiotropium  18 mcg Inhalation Daily  . warfarin  5 mg Oral ONCE-1800  . Warfarin - Pharmacist Dosing Inpatient   Does not apply q1800   Continuous Infusions:    Marinda Elk  [facility] Pager [phone]. If 8PM-8AM, please contact night-coverage at [email or web address], password [facility] 07/22/2012, 9:02 AM  LOS: 2 days

## 2012-07-22 NOTE — Progress Notes (Signed)
Reason for Consult: Multiple Falls Referring Physician:  Dr Robb Matar  CC: Falls  HPI: [name] is an 79 y.o. female who was admitted to [facility] through the emergency department on 07/20/2012 after she fell at home. The patient states that she was standing near her dresser removing some jewelry when she suddenly felt flushed and mildly dizzy. Her legs gave out and she immediately fell backward striking her head on another piece of furniture. She believes she may have lost consciousness for a short period of time.  The patient has had 2 other recent falls. Both of these occurred while she was out walking her dog while she ambulated with her walker. A neighbor upset her dog which cause the patient to fall. These 2 episodes were completed different from the first episode described above. The patient denied any loss of consciousness with these 2 falls; however, she did have some musculoskeletal injuries.  The patient also reports bilateral leg weakness which has been going on for some time. She has a great deal of difficulty standing up from a seated position. Once she is on her feet she ambulates with significant pain in both of her knees. Apparently this is to be evaluated by a Ortho. Neurology was asked to see the patient secondary to her falls with possible syncope. The patient has reportedly been bradycardic at times. Her admission EKG showed a ventricular rate of 52 beats per minute. She does have a history of paroxysmal atrial fibrillation followed by Dr. York Ram with [facility]. The patient's rate is controlled with amiodarone and metoprolol. She is on Coumadin although her INR was subtherapeutic at time of admission.  Past Medical History  Diagnosis Date  . Hyperlipidemia   . Hypertension   . Anxiety   . GERD (gastroesophageal reflux disease)   . CAD (coronary artery disease)     Cath Aug. 2011 > medical therapy recommended  . Diastolic dysfunction    Grade II  . RBBB (right bundle branch block)     Chronic  . Paroxysmal atrial fibrillation   . Hypoxemic respiratory failure, chronic     uses 2.5 liters of oxygen with sleep  . COPD (chronic obstructive pulmonary disease)     GOLD 4 - PFT 06/08/10 FEV1 0.93 (62%), FEV1% 60, TLC 2.84 (69%0, DLCO 54%, +BD  . Secondary pulmonary hypertension   . Edema   . Angina   . Shortness of breath   . CHF (congestive heart failure)   . Diabetes mellitus     Type II - goal A1C is 8, to avoid hypoglycemia  . Bradycardia 08/24/2011    Past Surgical History  Procedure Date  . Rotator cuff repair 07/2005    right  . Abdominal hysterectomy   . Cardiac stents      x2    Family History  Problem Relation Age of Onset  . Asthma Mother   . Cancer Mother     Skin  . Heart disease Mother   . Asthma Father   . Emphysema Father   . Asthma Brother   . Asthma Brother     Social History:  reports that she quit smoking about 19 years ago. Her smoking use included Cigarettes. She has a 22.5 pack-year smoking history. She has never used smokeless tobacco. She reports that she drinks alcohol. She reports that she does not use illicit drugs.  Allergies  Allergen Reactions  . Atorvastatin Other (See Comments)     muscle aches  .  Codeine Nausea And Vomiting  . Iohexol Other (See Comments)     Desc: unknown reaction; allergic to iodine and contrast   . Morphine Nausea And Vomiting  . Rosuvastatin Other (See Comments)    muscle aches at high doses, held as of 12/2010 due to aches    Medications:  Scheduled:   . amiodarone  200 mg Oral Daily  . aspirin  325 mg Oral Daily  . budesonide-formoterol  2 puff Inhalation BID  . enoxaparin (LOVENOX) injection  30 mg Subcutaneous Q24H  . furosemide  40 mg Oral BID  . insulin aspart  0-5 Units Subcutaneous QHS  . insulin aspart  0-9 Units Subcutaneous TID WC  . insulin glargine  13 Units Subcutaneous QHS  . isosorbide mononitrate  60 mg Oral Daily  .  metoprolol succinate  12.5 mg Oral Daily  . pantoprazole  40 mg Oral Daily  . potassium chloride SA  20 mEq Oral Daily  . sodium chloride  3 mL Intravenous Q12H  . tiotropium  18 mcg Inhalation Daily  . warfarin  5 mg Oral ONCE-1800  . Warfarin - Pharmacist Dosing Inpatient   Does not apply q1800    ROS: History obtained from patient  General ROS: negative for - chills, fatigue, fever, night sweats, weight gain or weight loss positive for generalized weakness Psychological ROS: negative for - behavioral disorder, hallucinations, memory difficulties, mood swings or suicidal ideation Ophthalmic ROS: negative for - blurry vision, double vision, eye pain or loss of vision the patient said disconjugate eye movements since childhood ENT ROS: negative for - epistaxis, nasal discharge, oral lesions, sore throat, the patient is hard of hearing-she has been bothered by a ringing in her ears recently. Allergy and Immunology ROS: negative for - hives or itchy/watery eyes Hematological and Lymphatic ROS: negative for - bleeding problems, bruising or swollen lymph nodes Endocrine ROS: negative for - galactorrhea, hair pattern changes, polydipsia/polyuria or temperature intolerance Respiratory ROS: negative for - cough, hemoptysis, positive for chronic shortness of breath  Cardiovascular ROS: negative for - chest pain, dyspnea on exertion, edema positive for irregular heartbeat Gastrointestinal ROS: negative for - abdominal pain, diarrhea, hematemesis, nausea or stool incontinence positive for constipation Positive for 1 episode of vomiting after drinking a milkshake. Genito-Urinary ROS: negative for - dysuria, hematuria, incontinence or urinary frequency/urgency Musculoskeletal ROS: negative for - joint swelling positive for leg weakness and knee pain bilaterally Neurological ROS: as noted in HPI Dermatological ROS: negative for rash and skin lesion changes   Physical Examination: Blood pressure  105/62, pulse 53, temperature 97.2 F (36.2 C), temperature source Oral, resp. rate 16, height 5' (1.524 m), weight 74.481 kg (164 lb 3.2 oz), SpO2 92.00%.  General - This is a pleasant 79 year old female who is quite alert and in no acute distress. Heart - distant heart sounds -with regular rate and rhythm. Lungs - decreased breath sounds throughout with no wheezing Abdomen - Soft - non tender Extremities - pulses are weak to absent with 1+ pitting edema bilaterally Skin - Warm and dry  Neurologic Examination  Mental Status: Alert, oriented, thought content appropriate.  Speech fluent without evidence of aphasia.  Able to follow 3 step commands with some prompting. 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 V,VII: smile symmetric, facial light touch sensation normal bilaterally VIII: The patient  is hard of hearing IX,X: gag reflex present XI: bilateral shoulder shrug XII: midline tongue extension Motor: Right : Upper extremity   5/5    Left:     Upper extremity   5/5  Lower extremity   5/5     Lower extremity   5/5 Tone and bulk:normal tone throughout; no atrophy noted Sensory: light touch intact throughout, bilaterally Sensation was also intact to vibration and temperature. Deep Tendon Reflexes: 3+ Pateller reflexes bilaterally. All others normal. Plantars: Right: downgoing   Left: downgoing Cerebellar: Normal finger-to-nose intact with mild tremor - essential /postural Gait: Antalgic narrrow based gait CV: pulses weak to absent bilaterally.  Laboratory Studies:   Basic Metabolic Panel:  Lab 2012/07/22 4782 07-22-2012 1643  NA -- 140  K -- 4.7  CL -- 101  CO2 -- 25  GLUCOSE -- 194*  BUN -- 28*  CREATININE 1.34* 1.37*  CALCIUM -- 9.6  MG -- --  PHOS -- --    Liver Function  Tests: No results found for this basename: AST:5,ALT:5,ALKPHOS:5,BILITOT:5,PROT:5,ALBUMIN:5 in the last 168 hours No results found for this basename: LIPASE:5,AMYLASE:5 in the last 168 hours No results found for this basename: AMMONIA:3 in the last 168 hours  CBC:  Lab 07-22-2012 2202 07-22-2012 1643  WBC 8.5 9.1  NEUTROABS -- 6.8  HGB 10.9* 11.5*  HCT 34.3* 36.1  MCV 94.5 94.8  PLT 263 306    Cardiac Enzymes:  Lab 07-22-2012 1643  CKTOTAL --  CKMB --  CKMBINDEX --  TROPONINI <0.30    BNP: No components found with this basename: POCBNP:5  CBG:  Lab 07/22/12 1155 07/22/12 0749 07/21/12 2138 07/21/12 1640 07/21/12 1120  GLUCAP 173* 132* 130* 169* 152*    Microbiology: Results for orders placed during the hospital encounter of 08/22/11  MRSA PCR SCREENING     Status: Normal   Collection Time   08/22/11  8:46 PM      Component Value Range Status Comment   MRSA by PCR NEGATIVE  NEGATIVE Final     Coagulation Studies:  Basename 07/22/12 0510 07-22-2012 1643  LABPROT 15.7* 17.1*  INR 1.28 1.43    Urinalysis:  Lab 07-22-2012 1644  COLORURINE YELLOW  LABSPEC 1.015  PHURINE 5.5  GLUCOSEU NEGATIVE  HGBUR NEGATIVE  BILIRUBINUR NEGATIVE  KETONESUR NEGATIVE  PROTEINUR NEGATIVE  UROBILINOGEN 0.2  NITRITE NEGATIVE  LEUKOCYTESUR MODERATE*    Lipid Panel:     Component Value Date/Time   CHOL 198 07/21/2012 0544   TRIG 95 07/21/2012 0544   HDL 67 07/21/2012 0544   CHOLHDL 3.0 07/21/2012 0544   VLDL 19 07/21/2012 0544   LDLCALC 112* 07/21/2012 0544    HgbA1C:  Lab Results  Component Value Date   HGBA1C 8.3* 07/21/2012    Urine Drug Screen:   No results found for this basename: labopia, cocainscrnur, labbenz, amphetmu, thcu, labbarb    Alcohol Level: No results found for this basename: ETH:2 in the last 168 hours  Other results: EKG SB rate 52 BPM  2D Echo  Ejection fraction 55-60%. No cardiac source of emboli was identified.  Imaging:  Dg  Sacrum/coccyx 07-22-12  IMPRESSION:  1.  Lumbar spondylosis and degenerative disc disease. 2.  No definite sacral fracture.  Sensitivity is reduced due to bony demineralization. Followup nuclear medicine bone scan or MRI could be used for greater sensitivity. 3.  Aortoiliac atherosclerosis.      Dg Hip Bilateral W/pelvis 07-22-2012    IMPRESSION: No acute fracture or subluxation.  Mild degenerative changes bilateral hip joints.    Ct Head Wo Contrast 07/20/2012  IMPRESSION: No acute intracranial abnormality.  Mild cerebral atrophy.  MRI HEAD WITHOUT CONTRAST IMPRESSION:  1. No acute intracranial abnormality.  2. Mild for age nonspecific signal changes, most commonly due to  chronic small vessel disease.  MRA Head Without Contrast 07/21/12 IMPRESSION: 1.  High-grade stenosis at the left MCA bifurcation affecting the origin of the posterior M2 branch.  However, distal flow is preserved. 2.  Mild anterior circulation atherosclerosis otherwise. 3.  Mild posterior circulation tortuosity without stenosis.     Mr Lumbar Spine Wo Contrast 07/21/2012   IMPRESSION: 1.  Widespread lower thoracic and lumbar disc chronic degeneration. Acute-on-chronic T12 degenerative endplate changes. 2.  Chronic grade 1 anterolisthesis at L4-L5 associated with advanced disc and facet degeneration resulting in mild spinal stenosis moderate right and mild left lateral recess and L4 foraminal stenosis. 3.  No other significant spinal stenosis.  Additional multilevel mild to moderate neural foraminal stenosis as detailed above.   Original Report      MRI SACRUM WITHOUT CONTRAST 07/21/12 IMPRESSION:  1.  Nondisplaced transverse fracture at the sacrococcygeal junction appears acute. 2.  Sigmoid diverticulosis. 3.  Several small focal T2 hyperintense lesions along the sacroiliac joints, favoring mild chronic sacroiliitis.    Delton See PA-C [facility] Pager [phone] 07/22/2012, 4:58 PM  I have seen and  evaluated the patient. I have reviewed the above note and made appropriate changes.    Assessment:   79 yo F with 3 recent falls, two of which were accidental. One of unknown etiology associated with possible momentary loss of consciousness. In addition she has high-grade aymptomatic stenosis at the left MCA bifurcation although distal flow is preserved. Probable chronic small vessel disease. She has documented bradycardia with a history of paroxysmal atrial fibrillation. I suspect cardiac syncope.   Plan: 1) Will need outpatient evaluation with vascular surgery for asymptomic carotid stenosis 2) agree with antiplatelet therapy given intracranial stenosis 3) Would also check lipids and start statin if LDL > 100.  4) Telemetry 5) Neurology will sign off at this time. Please call with any further questions.  6) Coumadin for stroke prevention in setting of afib.     Ritta Slot, MD [facility] [phone]  If 7pm- 7am, please page neurology on call at [phone].

## 2012-07-23 DIAGNOSIS — L570 Actinic keratosis: Secondary | ICD-10-CM

## 2012-07-23 LAB — PROTIME-INR
INR: 1.18 (ref 0.00–1.49)
Prothrombin Time: 14.8 seconds (ref 11.6–15.2)

## 2012-07-23 LAB — FOLATE RBC: RBC Folate: 729 ng/mL — ABNORMAL HIGH (ref >=366)

## 2012-07-23 LAB — CBC
HCT: 33.4 % — ABNORMAL LOW (ref 36.0–46.0)
Hemoglobin: 10.7 g/dL — ABNORMAL LOW (ref 12.0–15.0)
MCHC: 32 g/dL (ref 30.0–36.0)
WBC: 6.8 10*3/uL (ref 4.0–10.5)

## 2012-07-23 LAB — LIPID PANEL
Cholesterol: 190 mg/dL (ref 0–200)
HDL: 64 mg/dL (ref >39)

## 2012-07-23 LAB — GLUCOSE, CAPILLARY: Glucose-Capillary: 105 mg/dL — ABNORMAL HIGH (ref 70–99)

## 2012-07-23 MED ORDER — METHOCARBAMOL 500 MG PO TABS: 500.0000 mg | ORAL_TABLET | Freq: Three times a day (TID) | ORAL | Status: DC | PRN

## 2012-07-23 MED ORDER — METOPROLOL SUCCINATE 12.5 MG HALF TABLET
12.5000 mg | ORAL_TABLET | Freq: Every day | ORAL | Status: DC
  Administered 2012-07-23: 12.5 mg via ORAL

## 2012-07-23 MED ORDER — METOPROLOL SUCCINATE 12.5 MG HALF TABLET
12.5000 mg | ORAL_TABLET | Freq: Every day | ORAL | Status: DC
  Filled 2012-07-23: qty 1

## 2012-07-23 MED ORDER — METOPROLOL SUCCINATE 12.5 MG HALF TABLET: 12.5000 mg | ORAL_TABLET | Freq: Every day | ORAL | Status: DC

## 2012-07-23 MED ORDER — HYDROCODONE-ACETAMINOPHEN 5-325 MG PO TABS: 1.0000 | ORAL_TABLET | ORAL | Status: DC | PRN

## 2012-07-23 NOTE — Progress Notes (Signed)
Clinical Social Work Department CLINICAL SOCIAL WORK PLACEMENT NOTE 07/23/2012  Patient:  DALYAH, PLA  Account Number:  [id number] Admit date:  07/20/2012  Clinical Social Worker:  Oswaldo Done  Date/time:  07/22/2012 01:30 PM  Clinical Social Work is seeking post-discharge placement for this patient at the following level of care:   SKILLED NURSING   (*CSW will update this form in Epic as items are completed)   07/22/2012  Patient/family provided with [facility] Department of Clinical Social Work's list of facilities offering this level of care within the geographic area requested by the patient (or if unable, by the patient's family).  07/22/2012  Patient/family informed of their freedom to choose among providers that offer the needed level of care, that participate in Medicare, Medicaid or managed care program needed by the patient, have an available bed and are willing to accept the patient.  07/22/2012  Patient/family informed of [facility]' ownership interest in [facility], as well as of the fact that they are under no obligation to receive care at this facility.  PASARR submitted to EDS on 07/22/2012 PASARR number received from [employer] on 07/23/2012  FL2 transmitted to all facilities in geographic area requested by pt/family on  07/22/2012 FL2 transmitted to all facilities within larger geographic area on   Patient informed that his/her managed care company has contracts with or will negotiate with  certain facilities, including the following:     Patient/family informed of bed offers received:  07/23/2012 Patient chooses bed at [facility] Physician recommends and patient chooses bed at    Patient to be transferred to [facility] on  07/23/2012 Patient to be transferred to facility by EMS  The following physician request were entered in Epic:   Additional Comments:  Reece Levy, MSW, Theresia Majors [phone]

## 2012-07-23 NOTE — Progress Notes (Signed)
I called Dr. Robb Matar about patient's HR being 44-45.  He stated to still give patient's po metoprolol and that he would enter parameters to Hold for HR less that 40.

## 2012-07-23 NOTE — Discharge Summary (Signed)
Physician Discharge Summary  [name] GMW:[id number] DOB: 04-Aug-1927 DOA: 07/20/2012  PCP: Crawford Givens, MD  Admit date: 07/20/2012 Discharge date: 07/23/2012  Time spent: 35 minutes  Recommendations for Outpatient Follow-up:  1. Follow up with PCP CBG's titrate insulin as tolerate it.  Discharge Diagnoses:  Principal Problem:  *TIA (transient ischemic attack) Active Problems:  DM  HYPERLIPIDEMIA, statin intol  DIASTOLIC DYSFUNCTION  COPD (chronic obstructive pulmonary disease)  HTN (hypertension)  CAD, remote RCA PCI, subsequently noted to be occluded, last cath 8/11  Ataxia  Paroxysmal atrial fibrillation  Falls frequently  Sacral fracture, closed   Discharge Condition: stable  Diet recommendation: carb modified   Filed Weights   07/20/12 2134  Weight: 74.481 kg (164 lb 3.2 oz)    History of present illness:  79 y.o. female with multiple medical problems who was brought to the ED due to sudden onset dizziness and nausea and vomiting X 1 in the AM, and her dizziness precipitated a fall in AM. She denied having any headache or chest pain. She reports having more frequent falls and has fallen 3 times in the past 5 days. When she 5 days ago she fell back and hit her head. And in the AM when she fell again , she hit the back of her head again. She also has had increased weakness in both legs and increased low back pain over the past 6 weeks. She reports not being able to stand up or get up due to her weakness. She feels the problem is with her knees. Her PCP is making arrangements for an evaluation with Orthopedics as an outpatient.  In the ED, she was found to have a negative CT scan of the head for acute findings. And she was referred for medical admission.   Hospital Course:  Ataxia/frequen falls: - carotid doppler as below, will need a vascular surgery consult as an outpatient. Most likely multifactorial deconditioning and sacral fracture. - ASA, statins.  - PT/ot  consult: SNF.  - HbgA1c 8.3   Acute sacral fracture:  - MRI of sacrum showed : Nondisplaced transverse fracture at the sacrococcygeal junction  - robaxin andnarcotics for pain.  - INR subthx,  Hbg remained stable.   DM (01/11/2010) - add Lantuss SSI,  - HbgA1c 8.3   DIASTOLIC DYSFUNCTION (06/07/2010) - stable.  - resume lasix, cont metoprolol.   COPD (chronic obstructive pulmonary disease) (06/16/2010) - stable.   HTN (hypertension): - Bp improve today, ? High 2/2 to pain.  - follow up with PCP titrate Bp meds as tolerate it.  Procedures: MRI 1.26.2014: No acute intracranial abnormality.  ECHO: ef 55-60% grade diastolic dysfunction.  Carotid doppler: Right= 60-79% ICA stenosis. Left = 40-59% ICA stenosis.  Consultations:  neurology  Discharge Exam: Filed Vitals:   07/22/12 1736 07/22/12 2000 07/22/12 2036 07/23/12 0001  BP: 120/62 136/48  154/47  Pulse:  47  60  Temp:  98 F (36.7 C)  98 F (36.7 C)  TempSrc:    Oral  Resp:  16  18  Height:      Weight:      SpO2:  99% 96% 98%    General: A&O x3  Cardiovascular: RRR Respiratory: good air movement CTA B/L  Discharge Instructions  Discharge Orders    Future Appointments: Provider: Department: Dept Phone: Center:   07/24/2012 11:45 AM Joaquim Nam, MD [facility] [phone] [id number]   07/24/2012 3:30 PM [name], MD [facility] [phone] None  03/04/2013 2:30 PM Sherrie George, MD [facility] [phone] None     Future Orders Please Complete By Expires   Diet - low sodium heart healthy      Increase activity slowly          Medication List     As of 07/23/2012  7:42 AM    TAKE these medications         albuterol-ipratropium 18-103 MCG/ACT inhaler   Commonly known as: COMBIVENT   Inhale 2 puffs into the lungs every 4 (four) hours as needed. For shortness of breath      ALPRAZolam 0.25 MG tablet   Commonly known as: XANAX    Take 0.25 mg by mouth 2 (two) times daily as needed. for anxiety      amiodarone 200 MG tablet   Commonly known as: PACERONE   Take 200 mg by mouth every morning.      aspirin 81 MG tablet   Take 81 mg by mouth daily.      furosemide 40 MG tablet   Commonly known as: LASIX   Take 40 mg by mouth 2 (two) times daily.      HYDROcodone-acetaminophen 5-325 MG per tablet   Commonly known as: NORCO/VICODIN   Take 1 tablet by mouth every 4 (four) hours as needed.      insulin regular 100 units/mL injection   Commonly known as: NOVOLIN R,HUMULIN R   Inject 0-8 Units into the skin 3 (three) times daily before meals. Take as directed per sliding scale:150 or less do not administer insulin, 151 or higher give 4 units & >200 give 8 units.      isosorbide mononitrate 60 MG 24 hr tablet   Commonly known as: IMDUR   Take 60 mg by mouth every morning.      KLOR-CON M20 20 MEQ tablet   Generic drug: potassium chloride SA   Take 20 mEq by mouth every morning.      methocarbamol 500 MG tablet   Commonly known as: ROBAXIN   Take 1 tablet (500 mg total) by mouth every 8 (eight) hours as needed.      metoprolol succinate 25 MG 24 hr tablet   Commonly known as: TOPROL-XL   Take 12.5 mg by mouth daily.      PreserVision/Lutein Caps   Take by mouth daily.      PROTONIX 40 MG tablet   Generic drug: pantoprazole   Take 40 mg by mouth every morning.      SYMBICORT 160-4.5 MCG/ACT inhaler   Generic drug: budesonide-formoterol   Inhale 2 puffs into the lungs 2 (two) times daily.      tiotropium 18 MCG inhalation capsule   Commonly known as: SPIRIVA   Place 1 capsule (18 mcg total) into inhaler and inhale daily.      ULTIMA TEST test strip   Generic drug: glucose blood   USE ONE THREE TIMES DAILY      VALUE PLUS LANCETS THIN 26G Misc   Use as directed.      vitamin C 500 MG tablet   Commonly known as: ASCORBIC ACID   Take 500 mg by mouth daily.      warfarin 5 MG tablet   Commonly  known as: COUMADIN   Take 2.5-5 mg by mouth See admin instructions. Take 5mg  daily except for 2.9m on Saturday           Follow-up Information    Follow  up with Crawford Givens, MD. In 2 weeks. (hospital follow up)    Contact information:   [address] [employer] [place] [address] [phone]           The results of significant diagnostics from this hospitalization (including imaging, microbiology, ancillary and laboratory) are listed below for reference.    Significant Diagnostic Studies: Dg Sacrum/coccyx  07/20/2012  *RADIOLOGY REPORT*  Clinical Data: Fall.  Low back pain.  SACRUM AND COCCYX - 2+ VIEW  Comparison: 07/10/2011  Findings: Prominent bony demineralization noted.  The contours of the sacrum below the sacroiliac joints are not well seen, with the cortex nearly completely obscured due to the bony demineralization. Accordingly, sensitivity for fracture is low.  Lower lumbar spondylosis noted.  No discrete fracture observed on the lateral projection.  There is elimination of the intervertebral disc space at L5-S1, along with vacuum disc phenomenon at L3-4 and L4-5, posterior osseous ridging at L3-4 and L4-5, and stable grade 1 anterolisthesis at L4-5.  Dense aortoiliac atherosclerotic calcification noted.  IMPRESSION:  1.  Lumbar spondylosis and degenerative disc disease. 2.  No definite sacral fracture.  Sensitivity is reduced due to bony demineralization. Followup nuclear medicine bone scan or MRI could be used for greater sensitivity. 3.  Aortoiliac atherosclerosis.   Original Report Authenticated By: Gaylyn Rong, M.D.    Dg Hip Bilateral W/pelvis  07/20/2012  *RADIOLOGY REPORT*  Clinical Data: Fall  BILATERAL HIP WITH PELVIS - 4+ VIEW  Comparison: None.  Findings: Five views bilateral hip submitted.  No acute fracture or subluxation.  Mild degenerative changes bilateral hip joints with narrowing superior joint space.  IMPRESSION: No acute fracture or subluxation.  Mild  degenerative changes bilateral hip joints.   Original Report Authenticated By: Natasha Mead, M.D.    Ct Head Wo Contrast  07/20/2012  *RADIOLOGY REPORT*  Clinical Data: Recurrent falls  CT HEAD WITHOUT CONTRAST  Technique:  Contiguous axial images were obtained from the base of the skull through the vertex without contrast.  Comparison: 07/10/2011  Findings: No skull fracture is noted.  Paranasal sinuses and mastoid air cells are unremarkable.  No intracranial hemorrhage, mass effect or midline shift.  Atherosclerotic calcifications of carotid siphon.  No acute infarction.  No mass lesion is noted on this unenhanced scan.  Mild cerebral atrophy.  IMPRESSION: No acute intracranial abnormality.  Mild cerebral atrophy.   Original Report Authenticated By: Natasha Mead, M.D.    Mri Brain Without Contrast  07/21/2012  *RADIOLOGY REPORT*  Clinical Data:  78 year old female with dizziness, nausea, vomiting, falls.  Comparison: Head CTs without contrast 07/20/2012.  Cervical MRI 01/26 of seven.  MRI HEAD WITHOUT CONTRAST  Technique: Multiplanar, multiecho pulse sequences of the brain and surrounding structures were obtained according to standard protocol without intravenous contrast.  Findings: No restricted diffusion to suggest acute infarction.  No midline shift, mass effect, evidence of mass lesion, ventriculomegaly, extra-axial collection or acute intracranial hemorrhage.  Cervicomedullary junction and pituitary are within normal limits.  Major intracranial vascular flow voids are preserved; dominant distal right vertebral artery.  Mild for age scattered cerebral white matter T2 and FLAIR hyperintensity.  No cortical encephalomalacia.  Occasional chronic micro hemorrhages (left occipital lobe, left cerebellar hemisphere).  Deep gray matter nuclei and brain stem are within normal limits.  Negative for age visualized cervical spine.  Normal bone marrow signal.  Postoperative changes to the globes. Visualized paranasal  sinuses and mastoids are clear.  Grossly negative visualized internal auditory structures.  Negative scalp soft tissues.  IMPRESSION: 1. No acute intracranial abnormality. 2.  Mild for age nonspecific signal changes, most commonly due to chronic small vessel disease. 3.  See MRA findings below.  MRA HEAD WITHOUT CONTRAST  Technique: Angiographic images of the Circle of Willis were obtained using MRA technique without  intravenous contrast.  Findings: Antegrade flow in the posterior circulation with dominant distal right vertebral artery.  The left vertebral artery functionally terminates in PICA.  Normal right PICA.  Patent vertebrobasilar junction.  Mild basilar artery irregularity and tortuosity without stenosis.  Fetal right PCA origin.  SCA and left PCA origin are within normal limits.  Both proximal PCAs are tortuous.  No PCA stenosis.  The left posterior communicating artery is diminutive.  Antegrade flow in both ICA siphons.  No ICA stenosis.  Ophthalmic and posterior communicating artery origins are within normal limits.  Patent carotid termini.  MCA and ACA origins are within normal limits.  Mild irregularity and stenosis of the left ACA A1 segment. Diminutive anterior communicating artery.  Other visualized ACA branches are within normal limits.  Mild right MCA M1 segment irregularity.  Visualized right MCA branches otherwise are within normal limits.  Left MCA M1 segment is normal to the bifurcation.  At the bifurcation there is a high-grade stenosis of the origin of the posterior sylvian division (series 605 image 4).  There is mild ectasia just beyond the stenosis without discrete aneurysm.  The downstream left M2 branches remain patent despite this finding. Other visualized left MCA branches are within normal limits.  IMPRESSION: 1.  High-grade stenosis at the left MCA bifurcation affecting the origin of the posterior M2 branch.  However, distal flow is preserved. 2.  Mild anterior circulation  atherosclerosis otherwise. 3.  Mild posterior circulation tortuosity without stenosis.   Original Report Authenticated By: Erskine Speed, M.D.    Mr Lumbar Spine Wo Contrast  07/21/2012  *RADIOLOGY REPORT*  Clinical Data: 79 year old female with falls.  Dizziness, nausea and vomiting.  Bilateral lower extremity weakness.  MRI LUMBAR SPINE WITHOUT CONTRAST  Technique:  Multiplanar and multiecho pulse sequences of the lumbar spine were obtained without intravenous contrast.  Comparison: CT abdomen and pelvis 07/10/2011.  Findings: MRI sacrum performed at the same time is reported separately.  Hypoplastic ribs at T12 depicted on the comparison.  Chronic mild grade 1 anterolisthesis of L4 on L5.  Stable vertebral height and alignment since 2013.  Chronic T12 inferior endplate Schmorl's node.  Mild associated marrow edema. No acute osseous abnormality identified.   Visualized lower thoracic spinal cord is normal with conus medularis at L1-L2.  See below regarding levels of mild lower thoracic and upper lumbar spinal stenosis.  Negative paraspinal soft tissues except for subcutaneous edema. Stable renal atrophy.  Stable visualized abdominal viscera.  T8-T9:  Negative.  T9-T10:  Mild disc bulge.  Mild to moderate posterior element hypertrophy.  No significant spinal stenosis.  Mild right greater than left T9 foraminal stenosis.  T10-T11:  Mild to moderate circumferential disc osteophyte complex. Mild to moderate posterior element hypertrophy.  No significant spinal stenosis.  Mild left and moderate right T10 foraminal stenosis.  T11-T12:  Mild circumferential disc bulge.  No significant stenosis.  T12-L1:  Left eccentric circumferential disc bulge.  Mild to moderate facet and ligament flavum hypertrophy greater on the left. Mild left lateral recess stenosis.  Moderate left T12 foraminal stenosis.  No significant spinal stenosis.  L1-L2:  Mild circumferential disc bulge.  Small left paracentral  disc protrusion  superimposed.  Mild bilateral L1 foraminal stenosis.  No significant spinal or lateral recess stenosis.  L2-L3:  Mild circumferential disc bulge.  Mild facet and ligament flavum hypertrophy.  Mild bilateral L2 foraminal stenosis. Borderline spinal stenosis.  L3-L4:  Mild circumferential disc bulge.  Moderate facet and ligament flavum hypertrophy.  Mild spinal and right greater than left lateral recess stenosis.  Mild left and moderate right L3 foraminal stenosis.  L4-L5:  Grade 1 anterolisthesis.  Right eccentric circumferential disc osteophyte complex.  Severe facet hypertrophy.  Moderate to severe ligament flavum hypertrophy.  Posteriorly situated synovial cyst which will not cause neural compromise.  Mild spinal stenosis. Moderate right and mild left lateral recess stenosis.  Moderate right and mild left L4 foraminal stenosis.  L5-S1:  Vestigial disc.  Mild facet hypertrophy.  No significant stenosis.  IMPRESSION: 1.  Widespread lower thoracic and lumbar disc chronic degeneration. Acute-on-chronic T12 degenerative endplate changes. 2.  Chronic grade 1 anterolisthesis at L4-L5 associated with advanced disc and facet degeneration resulting in mild spinal stenosis moderate right and mild left lateral recess and L4 foraminal stenosis. 3.  No other significant spinal stenosis.  Additional multilevel mild to moderate neural foraminal stenosis as detailed above.   Original Report Authenticated By: Erskine Speed, M.D.    Mr Sacrum/si Joints Wo Contrast  07/21/2012  *RADIOLOGY REPORT*  Clinical Data:  Dizziness and falls.  Lower extremity weakness.  MRI SACRUM WITHOUT CONTRAST  Technique:  Multiplanar and multiecho pulse sequences of the sacrum to include the sacroiliac joints and the coccyx were obtained without intravenous contrast.  Comparison:  Radiographs from 07/20/2012; prior CT from 07/10/2011; prior MRI pelvis from 09/29/2007  Findings:  Transverse nondisplaced fracture at the sacrococcygeal junction noted with  adjacent marrow edema and low-level adjacent soft tissue edema as shown on image 11 of series 10 and image 6 of series 12.  Mild findings of degenerative sacroiliitis noted.  Sigmoid diverticulosis noted without active diverticulitis.  The uterus is absent.  No sciatic notch impingement or obturator impingement. Lumbar spine findings are available under accession number [id number].  IMPRESSION:  1.  Nondisplaced transverse fracture at the sacrococcygeal junction appears acute. 2.  Sigmoid diverticulosis. 3.  Several small focal T2 hyperintense lesions along the sacroiliac joints, favoring mild chronic sacroiliitis.   Original Report Authenticated By: Gaylyn Rong, M.D.    Mr Mra Head/brain Wo Cm  07/21/2012  *RADIOLOGY REPORT*  Clinical Data:  79 year old female with dizziness, nausea, vomiting, falls.  Comparison: Head CTs without contrast 07/20/2012.  Cervical MRI 01/26 of seven.  MRI HEAD WITHOUT CONTRAST  Technique: Multiplanar, multiecho pulse sequences of the brain and surrounding structures were obtained according to standard protocol without intravenous contrast.  Findings: No restricted diffusion to suggest acute infarction.  No midline shift, mass effect, evidence of mass lesion, ventriculomegaly, extra-axial collection or acute intracranial hemorrhage.  Cervicomedullary junction and pituitary are within normal limits.  Major intracranial vascular flow voids are preserved; dominant distal right vertebral artery.  Mild for age scattered cerebral white matter T2 and FLAIR hyperintensity.  No cortical encephalomalacia.  Occasional chronic micro hemorrhages (left occipital lobe, left cerebellar hemisphere).  Deep gray matter nuclei and brain stem are within normal limits.  Negative for age visualized cervical spine.  Normal bone marrow signal.  Postoperative changes to the globes. Visualized paranasal sinuses and mastoids are clear.  Grossly negative visualized internal auditory structures.  Negative  scalp soft tissues.  IMPRESSION: 1. No acute intracranial abnormality. 2.  Mild for age nonspecific signal changes, most commonly due to chronic small vessel disease. 3.  See MRA findings below.  MRA HEAD WITHOUT CONTRAST  Technique: Angiographic images of the Circle of Willis were obtained using MRA technique without  intravenous contrast.  Findings: Antegrade flow in the posterior circulation with dominant distal right vertebral artery.  The left vertebral artery functionally terminates in PICA.  Normal right PICA.  Patent vertebrobasilar junction.  Mild basilar artery irregularity and tortuosity without stenosis.  Fetal right PCA origin.  SCA and left PCA origin are within normal limits.  Both proximal PCAs are tortuous.  No PCA stenosis.  The left posterior communicating artery is diminutive.  Antegrade flow in both ICA siphons.  No ICA stenosis.  Ophthalmic and posterior communicating artery origins are within normal limits.  Patent carotid termini.  MCA and ACA origins are within normal limits.  Mild irregularity and stenosis of the left ACA A1 segment. Diminutive anterior communicating artery.  Other visualized ACA branches are within normal limits.  Mild right MCA M1 segment irregularity.  Visualized right MCA branches otherwise are within normal limits.  Left MCA M1 segment is normal to the bifurcation.  At the bifurcation there is a high-grade stenosis of the origin of the posterior sylvian division (series 605 image 4).  There is mild ectasia just beyond the stenosis without discrete aneurysm.  The downstream left M2 branches remain patent despite this finding. Other visualized left MCA branches are within normal limits.  IMPRESSION: 1.  High-grade stenosis at the left MCA bifurcation affecting the origin of the posterior M2 branch.  However, distal flow is preserved. 2.  Mild anterior circulation atherosclerosis otherwise. 3.  Mild posterior circulation tortuosity without stenosis.   Original Report  Authenticated By: Erskine Speed, M.D.     Microbiology: No results found for this or any previous visit (from the past 240 hour(s)).   Labs: Basic Metabolic Panel:  Lab 07/20/12 4540 07/20/12 1643  NA -- 140  K -- 4.7  CL -- 101  CO2 -- 25  GLUCOSE -- 194*  BUN -- 28*  CREATININE 1.34* 1.37*  CALCIUM -- 9.6  MG -- --  PHOS -- --   Liver Function Tests: No results found for this basename: AST:5,ALT:5,ALKPHOS:5,BILITOT:5,PROT:5,ALBUMIN:5 in the last 168 hours No results found for this basename: LIPASE:5,AMYLASE:5 in the last 168 hours No results found for this basename: AMMONIA:5 in the last 168 hours CBC:  Lab 07/20/12 2202 07/20/12 1643  WBC 8.5 9.1  NEUTROABS -- 6.8  HGB 10.9* 11.5*  HCT 34.3* 36.1  MCV 94.5 94.8  PLT 263 306   Cardiac Enzymes:  Lab 07/20/12 1643  CKTOTAL --  CKMB --  CKMBINDEX --  TROPONINI <0.30   BNP: BNP (last 3 results)  Basename 07/20/12 1643 08/24/11 0544 08/23/11 1041  PROBNP 1039.0* 808.6* 1536.0*   CBG:  Lab 07/22/12 2109 07/22/12 1721 07/22/12 1155 07/22/12 0749 07/21/12 2138  GLUCAP 219* 130* 173* 132* 130*       Signed:  FELIZ ORTIZ, ABRAHAM  [facility] 07/23/2012, 7:42 AM

## 2012-07-23 NOTE — Progress Notes (Addendum)
Patient for d/c today to SNF bed at [facility]. Daughter and patient agreeable to this plan- plan transfer via EMS. Reece Levy, MSW, LCSWA [phone]   [facility] Medicare [name] rec'd for SNF and EMS transport.

## 2012-07-23 NOTE — Progress Notes (Signed)
SNF bed confirmed at [facility] and family is pleased- I have submitted clinicals to [facility] for SNF auth and await auth# prior to d/c. Will advise-  Reece Levy, MSW, Theresia Majors [phone]

## 2012-07-24 ENCOUNTER — Ambulatory Visit: Payer: Medicare Other | Admitting: Family Medicine

## 2012-07-24 ENCOUNTER — Ambulatory Visit: Payer: Medicare Other | Admitting: Pulmonary Disease

## 2012-07-29 ENCOUNTER — Emergency Department (HOSPITAL_COMMUNITY): Payer: Medicare Other

## 2012-07-29 ENCOUNTER — Inpatient Hospital Stay (HOSPITAL_COMMUNITY)
Admission: EM | Admit: 2012-07-29 | Discharge: 2012-07-31 | DRG: 292 | Disposition: A | Discharge status: Skilled Nursing Facility | Payer: Medicare Other | Attending: Internal Medicine | Admitting: Internal Medicine

## 2012-07-29 ENCOUNTER — Encounter (HOSPITAL_COMMUNITY): Payer: Self-pay | Admitting: Emergency Medicine

## 2012-07-29 DIAGNOSIS — R296 Repeated falls: Secondary | ICD-10-CM

## 2012-07-29 DIAGNOSIS — E785 Hyperlipidemia, unspecified: Secondary | ICD-10-CM | POA: Diagnosis present

## 2012-07-29 DIAGNOSIS — K219 Gastro-esophageal reflux disease without esophagitis: Secondary | ICD-10-CM | POA: Diagnosis present

## 2012-07-29 DIAGNOSIS — W19XXXA Unspecified fall, initial encounter: Secondary | ICD-10-CM | POA: Diagnosis present

## 2012-07-29 DIAGNOSIS — L570 Actinic keratosis: Secondary | ICD-10-CM

## 2012-07-29 DIAGNOSIS — N183 Chronic kidney disease, stage 3 unspecified: Secondary | ICD-10-CM | POA: Diagnosis present

## 2012-07-29 DIAGNOSIS — I519 Heart disease, unspecified: Secondary | ICD-10-CM | POA: Diagnosis present

## 2012-07-29 DIAGNOSIS — F411 Generalized anxiety disorder: Secondary | ICD-10-CM | POA: Diagnosis present

## 2012-07-29 DIAGNOSIS — R279 Unspecified lack of coordination: Secondary | ICD-10-CM | POA: Diagnosis present

## 2012-07-29 DIAGNOSIS — J9610 Chronic respiratory failure, unspecified whether with hypoxia or hypercapnia: Secondary | ICD-10-CM | POA: Diagnosis present

## 2012-07-29 DIAGNOSIS — I6529 Occlusion and stenosis of unspecified carotid artery: Secondary | ICD-10-CM | POA: Diagnosis present

## 2012-07-29 DIAGNOSIS — Z87891 Personal history of nicotine dependence: Secondary | ICD-10-CM

## 2012-07-29 DIAGNOSIS — M25519 Pain in unspecified shoulder: Secondary | ICD-10-CM

## 2012-07-29 DIAGNOSIS — Z79899 Other long term (current) drug therapy: Secondary | ICD-10-CM

## 2012-07-29 DIAGNOSIS — I2789 Other specified pulmonary heart diseases: Secondary | ICD-10-CM | POA: Diagnosis present

## 2012-07-29 DIAGNOSIS — M7989 Other specified soft tissue disorders: Secondary | ICD-10-CM | POA: Diagnosis present

## 2012-07-29 DIAGNOSIS — I5033 Acute on chronic diastolic (congestive) heart failure: Principal | ICD-10-CM | POA: Diagnosis present

## 2012-07-29 DIAGNOSIS — Z7901 Long term (current) use of anticoagulants: Secondary | ICD-10-CM

## 2012-07-29 DIAGNOSIS — R2681 Unsteadiness on feet: Secondary | ICD-10-CM

## 2012-07-29 DIAGNOSIS — S3210XA Unspecified fracture of sacrum, initial encounter for closed fracture: Secondary | ICD-10-CM | POA: Diagnosis present

## 2012-07-29 DIAGNOSIS — J449 Chronic obstructive pulmonary disease, unspecified: Secondary | ICD-10-CM

## 2012-07-29 DIAGNOSIS — E119 Type 2 diabetes mellitus without complications: Secondary | ICD-10-CM | POA: Diagnosis present

## 2012-07-29 DIAGNOSIS — I480 Paroxysmal atrial fibrillation: Secondary | ICD-10-CM | POA: Diagnosis present

## 2012-07-29 DIAGNOSIS — R29898 Other symptoms and signs involving the musculoskeletal system: Secondary | ICD-10-CM

## 2012-07-29 DIAGNOSIS — E1122 Type 2 diabetes mellitus with diabetic chronic kidney disease: Secondary | ICD-10-CM

## 2012-07-29 DIAGNOSIS — R270 Ataxia, unspecified: Secondary | ICD-10-CM

## 2012-07-29 DIAGNOSIS — I739 Peripheral vascular disease, unspecified: Secondary | ICD-10-CM | POA: Diagnosis present

## 2012-07-29 DIAGNOSIS — I4510 Unspecified right bundle-branch block: Secondary | ICD-10-CM

## 2012-07-29 DIAGNOSIS — Z9181 History of falling: Secondary | ICD-10-CM

## 2012-07-29 DIAGNOSIS — J439 Emphysema, unspecified: Secondary | ICD-10-CM | POA: Diagnosis present

## 2012-07-29 DIAGNOSIS — I4891 Unspecified atrial fibrillation: Secondary | ICD-10-CM

## 2012-07-29 DIAGNOSIS — R269 Unspecified abnormalities of gait and mobility: Secondary | ICD-10-CM | POA: Diagnosis present

## 2012-07-29 DIAGNOSIS — J9611 Chronic respiratory failure with hypoxia: Secondary | ICD-10-CM | POA: Diagnosis present

## 2012-07-29 DIAGNOSIS — E1129 Type 2 diabetes mellitus with other diabetic kidney complication: Secondary | ICD-10-CM | POA: Diagnosis present

## 2012-07-29 DIAGNOSIS — I129 Hypertensive chronic kidney disease with stage 1 through stage 4 chronic kidney disease, or unspecified chronic kidney disease: Secondary | ICD-10-CM | POA: Diagnosis present

## 2012-07-29 DIAGNOSIS — Z9861 Coronary angioplasty status: Secondary | ICD-10-CM

## 2012-07-29 DIAGNOSIS — J4489 Other specified chronic obstructive pulmonary disease: Secondary | ICD-10-CM | POA: Diagnosis present

## 2012-07-29 DIAGNOSIS — I509 Heart failure, unspecified: Secondary | ICD-10-CM | POA: Diagnosis present

## 2012-07-29 DIAGNOSIS — Z794 Long term (current) use of insulin: Secondary | ICD-10-CM

## 2012-07-29 DIAGNOSIS — I2510 Atherosclerotic heart disease of native coronary artery without angina pectoris: Secondary | ICD-10-CM | POA: Diagnosis present

## 2012-07-29 DIAGNOSIS — I10 Essential (primary) hypertension: Secondary | ICD-10-CM | POA: Diagnosis present

## 2012-07-29 LAB — CBC WITH DIFFERENTIAL/PLATELET
Basophils Absolute: 0.1 10*3/uL (ref 0.0–0.1)
Basophils Relative: 1 % (ref 0–1)
Eosinophils Absolute: 0 10*3/uL (ref 0.0–0.7)
Eosinophils Relative: 0 % (ref 0–5)
Lymphocytes Relative: 14 % (ref 12–46)
MCH: 29.6 pg (ref 26.0–34.0)
MCHC: 31.7 g/dL (ref 30.0–36.0)
MCV: 93.5 fL (ref 78.0–100.0)
Monocytes Absolute: 0.5 10*3/uL (ref 0.1–1.0)
Platelets: 295 10*3/uL (ref 150–400)
RDW: 14.7 % (ref 11.5–15.5)
WBC: 7.4 10*3/uL (ref 4.0–10.5)

## 2012-07-29 LAB — POCT I-STAT, CHEM 8
Chloride: 110 mEq/L (ref 96–112)
Creatinine, Ser: 1.8 mg/dL — ABNORMAL HIGH (ref 0.50–1.10)
HCT: 30 % — ABNORMAL LOW (ref 36.0–46.0)
Hemoglobin: 10.2 g/dL — ABNORMAL LOW (ref 12.0–15.0)
Potassium: 4 mEq/L (ref 3.5–5.1)
Sodium: 144 mEq/L (ref 135–145)

## 2012-07-29 LAB — PROTIME-INR: INR: 2.52 — ABNORMAL HIGH (ref 0.00–1.49)

## 2012-07-29 MED ORDER — NITROGLYCERIN 2 % TD OINT
1.0000 [in_us] | TOPICAL_OINTMENT | Freq: Once | TRANSDERMAL | Status: AC
  Administered 2012-07-29: 1 [in_us] via TOPICAL
  Filled 2012-07-29: qty 1

## 2012-07-29 MED ORDER — FUROSEMIDE 10 MG/ML IJ SOLN
20.0000 mg | Freq: Once | INTRAMUSCULAR | Status: AC
  Administered 2012-07-29: 20 mg via INTRAVENOUS
  Filled 2012-07-29: qty 2

## 2012-07-29 MED ORDER — ASPIRIN 81 MG PO CHEW
324.0000 mg | CHEWABLE_TABLET | Freq: Once | ORAL | Status: AC
  Administered 2012-07-29: 324 mg via ORAL
  Filled 2012-07-29: qty 4

## 2012-07-29 NOTE — ED Notes (Signed)
Per EMS pt came from [place] [facility] to be evaluated for SOB and leg swelling. Pt was released from the hospital Jan 24 for the same. Pt has hx of CHF and edema.

## 2012-07-29 NOTE — ED Provider Notes (Addendum)
History     CSN: [id number]  Arrival date & time 07/29/12  1908   First MD Initiated Contact with Patient 07/29/12 1951      Chief Complaint  Patient presents with  . Shortness of Breath  . Leg Swelling    (Consider location/radiation/quality/duration/timing/severity/associated sxs/prior treatment) HPI Complains of worsening leg edema for approximately several days, progressively worsening and shortness of breath gradual onset several days ago. Patient also reports that she had an episode of angina today lasting 2 or 3 minutes, which resolved spontaneously without treatment. She was to with her usual medications. Denies chest pain or anginal type pain at present. Presently complains of mild shortness of breath. No other complaint. Past Medical History  Diagnosis Date  . Hyperlipidemia   . Hypertension   . Anxiety   . GERD (gastroesophageal reflux disease)   . CAD (coronary artery disease)     Cath Aug. 2011 > medical therapy recommended  . Diastolic dysfunction     Grade II  . RBBB (right bundle branch block)     Chronic  . Paroxysmal atrial fibrillation   . Hypoxemic respiratory failure, chronic     uses 2.5 liters of oxygen with sleep  . COPD (chronic obstructive pulmonary disease)     GOLD 4 - PFT 06/08/10 FEV1 0.93 (62%), FEV1% 60, TLC 2.84 (69%0, DLCO 54%, +BD  . Secondary pulmonary hypertension   . Edema   . Angina   . Shortness of breath   . CHF (congestive heart failure)   . Diabetes mellitus     Type II - goal A1C is 8, to avoid hypoglycemia  . Bradycardia 08/24/2011    Past Surgical History  Procedure Date  . Rotator cuff repair 07/2005    right  . Abdominal hysterectomy   . Cardiac stents      x2    Family History  Problem Relation Age of Onset  . Asthma Mother   . Cancer Mother     Skin  . Heart disease Mother   . Asthma Father   . Emphysema Father   . Asthma Brother   . Asthma Brother     History  Substance Use Topics  . Smoking status:  Former Smoker -- 1.5 packs/day for 15 years    Types: Cigarettes    Quit date: 06/27/1993  . Smokeless tobacco: Never Used     Comment: Smoked 1.5 packs per day for 15 years, quit in 1995.  Marland Kitchen Alcohol Use: Yes     Comment: Occasional    OB History    Grav Para Term Preterm Abortions TAB SAB Ect Mult Living                  Review of Systems  Constitutional: Negative.   HENT: Negative.   Respiratory: Positive for shortness of breath.   Cardiovascular: Positive for chest pain and leg swelling.  Gastrointestinal: Negative.   Musculoskeletal: Negative.   Skin: Negative.   Neurological: Negative.   Hematological: Negative.   Psychiatric/Behavioral: Negative.   All other systems reviewed and are negative.    Allergies  Atorvastatin; Codeine; Iohexol; Morphine; and Rosuvastatin  Home Medications   Current Outpatient Rx  Name  Route  Sig  Dispense  Refill  . AMIODARONE HCL 200 MG PO TABS   Oral   Take 200 mg by mouth every morning.          . ASPIRIN 81 MG PO CHEW   Oral   Chew 81 mg  by mouth daily.         . ISOSORBIDE MONONITRATE ER 60 MG PO TB24   Oral   Take 60 mg by mouth daily.          Marland Kitchen METOPROLOL SUCCINATE ER 25 MG PO TB24   Oral   Take 12.5 mg by mouth daily.         Marland Kitchen PRESERVISION/LUTEIN PO CAPS   Oral   Take 1 capsule by mouth daily.          Marland Kitchen POTASSIUM CHLORIDE CRYS ER 20 MEQ PO TBCR   Oral   Take 20 mEq by mouth daily.          Bernadette Hoit SODIUM 8.6-50 MG PO TABS   Oral   Take 1 tablet by mouth 2 (two) times daily.         . WARFARIN SODIUM 4 MG PO TABS   Oral   Take 4 mg by mouth every evening.         Marland Kitchen ALPRAZOLAM 0.25 MG PO TABS   Oral   Take 0.25 mg by mouth 2 (two) times daily as needed. for anxiety         . BUDESONIDE-FORMOTEROL FUMARATE 160-4.5 MCG/ACT IN AERO   Inhalation   Inhale 2 puffs into the lungs 2 (two) times daily.           Marland Kitchen HYDROCODONE-ACETAMINOPHEN 5-325 MG PO TABS   Oral   Take 1  tablet by mouth every 4 (four) hours as needed.   30 tablet   0   . LEVEMIR FLEXPEN 100 UNIT/ML [place] SOLN               . METHOCARBAMOL 500 MG PO TABS   Oral   Take 1 tablet (500 mg total) by mouth every 8 (eight) hours as needed.   30 tablet   0   . NIFEDIPINE ER OSMOTIC 30 MG PO TB24               . PANTOPRAZOLE SODIUM 40 MG PO TBEC   Oral   Take 40 mg by mouth every morning.          Marland Kitchen ULTIMA TEST VI STRP      USE ONE THREE TIMES DAILY   300 each   PRN   . VALUE PLUS LANCETS THIN 26G MISC      Use as directed.   200 each   11   . VITAMIN C 500 MG PO TABS   Oral   Take 500 mg by mouth daily.           BP 130/83  Pulse 93  Temp 97.1 F (36.2 C) (Oral)  Resp 16  SpO2 99%  Physical Exam  Nursing note and vitals reviewed. Constitutional: She appears well-developed and well-nourished.  HENT:  Head: Normocephalic and atraumatic.  Eyes: Conjunctivae normal are normal. Pupils are equal, round, and reactive to light.  Neck: Neck supple. JVD present. No tracheal deviation present. No thyromegaly present.  Cardiovascular: Normal rate and regular rhythm.   No murmur heard. Pulmonary/Chest: Effort normal and breath sounds normal. No respiratory distress.  Abdominal: Soft. Bowel sounds are normal. She exhibits no distension. There is no tenderness.  Musculoskeletal: Normal range of motion. She exhibits edema. She exhibits no tenderness.       2+ pretibial pitting edema bilateral  Neurological: She is alert. Coordination normal.  Skin: Skin is warm and dry. No rash noted.  Psychiatric: She  has a normal mood and affect.    ED Course  Procedures (including critical care time)  Labs Reviewed - No data to display No results found.   No diagnosis found.   Date: 07/29/2012  Rate: 65  Rhythm: normal sinus rhythm  QRS Axis: normal  Intervals: normal  ST/T Wave abnormalities: nonspecific T wave changes  Conduction Disutrbances:right bundle branch  block  Narrative Interpretation:   Old EKG Reviewed: No significant change from 07/20/2012 interpreted by me  Chest x-ray reviewed by me  Results for orders placed during the hospital encounter of 07/29/12  CBC WITH DIFFERENTIAL      Component Value Range   WBC 7.4  4.0 - 10.5 K/uL   RBC 3.41 (*) 3.87 - 5.11 MIL/uL   Hemoglobin 10.1 (*) 12.0 - 15.0 g/dL   HCT 40.1 (*) 02.7 - 25.3 %   MCV 93.5  78.0 - 100.0 fL   MCH 29.6  26.0 - 34.0 pg   MCHC 31.7  30.0 - 36.0 g/dL   RDW 66.4  40.3 - 47.4 %   Platelets 295  150 - 400 K/uL   Neutrophils Relative 78 (*) 43 - 77 %   Neutro Abs 5.8  1.7 - 7.7 K/uL   Lymphocytes Relative 14  12 - 46 %   Lymphs Abs 1.0  0.7 - 4.0 K/uL   Monocytes Relative 7  3 - 12 %   Monocytes Absolute 0.5  0.1 - 1.0 K/uL   Eosinophils Relative 0  0 - 5 %   Eosinophils Absolute 0.0  0.0 - 0.7 K/uL   Basophils Relative 1  0 - 1 %   Basophils Absolute 0.1  0.0 - 0.1 K/uL  PROTIME-INR      Component Value Range   Prothrombin Time 26.0 (*) 11.6 - 15.2 seconds   INR 2.52 (*) 0.00 - 1.49  POCT I-STAT TROPONIN I      Component Value Range   Troponin i, poc 0.02  0.00 - 0.08 ng/mL   Comment 3           POCT I-STAT, CHEM 8      Component Value Range   Sodium 144  135 - 145 mEq/L   Potassium 4.0  3.5 - 5.1 mEq/L   Chloride 110  96 - 112 mEq/L   BUN 42 (*) 6 - 23 mg/dL   Creatinine, Ser 2.59 (*) 0.50 - 1.10 mg/dL   Glucose, Bld 75  70 - 99 mg/dL   Calcium, Ion 5.63  8.75 - 1.30 mmol/L   TCO2 25  0 - 100 mmol/L   Hemoglobin 10.2 (*) 12.0 - 15.0 g/dL   HCT 64.3 (*) 32.9 - 51.8 %   Dg Sacrum/coccyx  07/20/2012  *RADIOLOGY REPORT*  Clinical Data: Fall.  Low back pain.  SACRUM AND COCCYX - 2+ VIEW  Comparison: 07/10/2011  Findings: Prominent bony demineralization noted.  The contours of the sacrum below the sacroiliac joints are not well seen, with the cortex nearly completely obscured due to the bony demineralization. Accordingly, sensitivity for fracture is low.   Lower lumbar spondylosis noted.  No discrete fracture observed on the lateral projection.  There is elimination of the intervertebral disc space at L5-S1, along with vacuum disc phenomenon at L3-4 and L4-5, posterior osseous ridging at L3-4 and L4-5, and stable grade 1 anterolisthesis at L4-5.  Dense aortoiliac atherosclerotic calcification noted.  IMPRESSION:  1.  Lumbar spondylosis and degenerative disc disease. 2.  No definite sacral fracture.  Sensitivity is reduced due to bony demineralization. Followup nuclear medicine bone scan or MRI could be used for greater sensitivity. 3.  Aortoiliac atherosclerosis.   Original Report Authenticated By: Gaylyn Rong, M.D.    Dg Hip Bilateral W/pelvis  07/20/2012  *RADIOLOGY REPORT*  Clinical Data: Fall  BILATERAL HIP WITH PELVIS - 4+ VIEW  Comparison: None.  Findings: Five views bilateral hip submitted.  No acute fracture or subluxation.  Mild degenerative changes bilateral hip joints with narrowing superior joint space.  IMPRESSION: No acute fracture or subluxation.  Mild degenerative changes bilateral hip joints.   Original Report Authenticated By: Natasha Mead, M.D.    Ct Head Wo Contrast  07/20/2012  *RADIOLOGY REPORT*  Clinical Data: Recurrent falls  CT HEAD WITHOUT CONTRAST  Technique:  Contiguous axial images were obtained from the base of the skull through the vertex without contrast.  Comparison: 07/10/2011  Findings: No skull fracture is noted.  Paranasal sinuses and mastoid air cells are unremarkable.  No intracranial hemorrhage, mass effect or midline shift.  Atherosclerotic calcifications of carotid siphon.  No acute infarction.  No mass lesion is noted on this unenhanced scan.  Mild cerebral atrophy.  IMPRESSION: No acute intracranial abnormality.  Mild cerebral atrophy.   Original Report Authenticated By: Natasha Mead, M.D.    Mri Brain Without Contrast  07/21/2012  *RADIOLOGY REPORT*  Clinical Data:  79 year old female with dizziness, nausea,  vomiting, falls.  Comparison: Head CTs without contrast 07/20/2012.  Cervical MRI 01/26 of seven.  MRI HEAD WITHOUT CONTRAST  Technique: Multiplanar, multiecho pulse sequences of the brain and surrounding structures were obtained according to standard protocol without intravenous contrast.  Findings: No restricted diffusion to suggest acute infarction.  No midline shift, mass effect, evidence of mass lesion, ventriculomegaly, extra-axial collection or acute intracranial hemorrhage.  Cervicomedullary junction and pituitary are within normal limits.  Major intracranial vascular flow voids are preserved; dominant distal right vertebral artery.  Mild for age scattered cerebral white matter T2 and FLAIR hyperintensity.  No cortical encephalomalacia.  Occasional chronic micro hemorrhages (left occipital lobe, left cerebellar hemisphere).  Deep gray matter nuclei and brain stem are within normal limits.  Negative for age visualized cervical spine.  Normal bone marrow signal.  Postoperative changes to the globes. Visualized paranasal sinuses and mastoids are clear.  Grossly negative visualized internal auditory structures.  Negative scalp soft tissues.  IMPRESSION: 1. No acute intracranial abnormality. 2.  Mild for age nonspecific signal changes, most commonly due to chronic small vessel disease. 3.  See MRA findings below.  MRA HEAD WITHOUT CONTRAST  Technique: Angiographic images of the Circle of Willis were obtained using MRA technique without  intravenous contrast.  Findings: Antegrade flow in the posterior circulation with dominant distal right vertebral artery.  The left vertebral artery functionally terminates in PICA.  Normal right PICA.  Patent vertebrobasilar junction.  Mild basilar artery irregularity and tortuosity without stenosis.  Fetal right PCA origin.  SCA and left PCA origin are within normal limits.  Both proximal PCAs are tortuous.  No PCA stenosis.  The left posterior communicating artery is  diminutive.  Antegrade flow in both ICA siphons.  No ICA stenosis.  Ophthalmic and posterior communicating artery origins are within normal limits.  Patent carotid termini.  MCA and ACA origins are within normal limits.  Mild irregularity and stenosis of the left ACA A1 segment. Diminutive anterior communicating artery.  Other visualized ACA branches are within normal limits.  Mild right MCA M1 segment irregularity.  Visualized right MCA branches otherwise are within normal limits.  Left MCA M1 segment is normal to the bifurcation.  At the bifurcation there is a high-grade stenosis of the origin of the posterior sylvian division (series 605 image 4).  There is mild ectasia just beyond the stenosis without discrete aneurysm.  The downstream left M2 branches remain patent despite this finding. Other visualized left MCA branches are within normal limits.  IMPRESSION: 1.  High-grade stenosis at the left MCA bifurcation affecting the origin of the posterior M2 branch.  However, distal flow is preserved. 2.  Mild anterior circulation atherosclerosis otherwise. 3.  Mild posterior circulation tortuosity without stenosis.   Original Report Authenticated By: Erskine Speed, M.D.    Mr Lumbar Spine Wo Contrast  07/21/2012  *RADIOLOGY REPORT*  Clinical Data: 79 year old female with falls.  Dizziness, nausea and vomiting.  Bilateral lower extremity weakness.  MRI LUMBAR SPINE WITHOUT CONTRAST  Technique:  Multiplanar and multiecho pulse sequences of the lumbar spine were obtained without intravenous contrast.  Comparison: CT abdomen and pelvis 07/10/2011.  Findings: MRI sacrum performed at the same time is reported separately.  Hypoplastic ribs at T12 depicted on the comparison.  Chronic mild grade 1 anterolisthesis of L4 on L5.  Stable vertebral height and alignment since 2013.  Chronic T12 inferior endplate Schmorl's node.  Mild associated marrow edema. No acute osseous abnormality identified.   Visualized lower thoracic  spinal cord is normal with conus medularis at L1-L2.  See below regarding levels of mild lower thoracic and upper lumbar spinal stenosis.  Negative paraspinal soft tissues except for subcutaneous edema. Stable renal atrophy.  Stable visualized abdominal viscera.  T8-T9:  Negative.  T9-T10:  Mild disc bulge.  Mild to moderate posterior element hypertrophy.  No significant spinal stenosis.  Mild right greater than left T9 foraminal stenosis.  T10-T11:  Mild to moderate circumferential disc osteophyte complex. Mild to moderate posterior element hypertrophy.  No significant spinal stenosis.  Mild left and moderate right T10 foraminal stenosis.  T11-T12:  Mild circumferential disc bulge.  No significant stenosis.  T12-L1:  Left eccentric circumferential disc bulge.  Mild to moderate facet and ligament flavum hypertrophy greater on the left. Mild left lateral recess stenosis.  Moderate left T12 foraminal stenosis.  No significant spinal stenosis.  L1-L2:  Mild circumferential disc bulge.  Small left paracentral disc protrusion superimposed.  Mild bilateral L1 foraminal stenosis.  No significant spinal or lateral recess stenosis.  L2-L3:  Mild circumferential disc bulge.  Mild facet and ligament flavum hypertrophy.  Mild bilateral L2 foraminal stenosis. Borderline spinal stenosis.  L3-L4:  Mild circumferential disc bulge.  Moderate facet and ligament flavum hypertrophy.  Mild spinal and right greater than left lateral recess stenosis.  Mild left and moderate right L3 foraminal stenosis.  L4-L5:  Grade 1 anterolisthesis.  Right eccentric circumferential disc osteophyte complex.  Severe facet hypertrophy.  Moderate to severe ligament flavum hypertrophy.  Posteriorly situated synovial cyst which will not cause neural compromise.  Mild spinal stenosis. Moderate right and mild left lateral recess stenosis.  Moderate right and mild left L4 foraminal stenosis.  L5-S1:  Vestigial disc.  Mild facet hypertrophy.  No significant  stenosis.  IMPRESSION: 1.  Widespread lower thoracic and lumbar disc chronic degeneration. Acute-on-chronic T12 degenerative endplate changes. 2.  Chronic grade 1 anterolisthesis at L4-L5 associated with advanced disc and facet degeneration resulting in mild spinal stenosis moderate right and mild left lateral recess and L4 foraminal stenosis. 3.  No other  significant spinal stenosis.  Additional multilevel mild to moderate neural foraminal stenosis as detailed above.   Original Report Authenticated By: Erskine Speed, M.D.    Mr Sacrum/si Joints Wo Contrast  07/21/2012  *RADIOLOGY REPORT*  Clinical Data:  Dizziness and falls.  Lower extremity weakness.  MRI SACRUM WITHOUT CONTRAST  Technique:  Multiplanar and multiecho pulse sequences of the sacrum to include the sacroiliac joints and the coccyx were obtained without intravenous contrast.  Comparison:  Radiographs from 07/20/2012; prior CT from 07/10/2011; prior MRI pelvis from 09/29/2007  Findings:  Transverse nondisplaced fracture at the sacrococcygeal junction noted with adjacent marrow edema and low-level adjacent soft tissue edema as shown on image 11 of series 10 and image 6 of series 12.  Mild findings of degenerative sacroiliitis noted.  Sigmoid diverticulosis noted without active diverticulitis.  The uterus is absent.  No sciatic notch impingement or obturator impingement. Lumbar spine findings are available under accession number [id number].  IMPRESSION:  1.  Nondisplaced transverse fracture at the sacrococcygeal junction appears acute. 2.  Sigmoid diverticulosis. 3.  Several small focal T2 hyperintense lesions along the sacroiliac joints, favoring mild chronic sacroiliitis.   Original Report Authenticated By: Gaylyn Rong, M.D.    Dg Chest Port 1 View  07/29/2012  *RADIOLOGY REPORT*  Clinical Data: Chest pain. Shortness of breath.  Diabetic hypertensive nonsmoker.  History of congestive heart failure.  PORTABLE CHEST - 1 VIEW  Comparison:  08/24/2011.  Findings: Cardiomegaly.  Pulmonary vascular congestion/mild pulmonary edema.  Calcified tortuous aorta.  No segmental consolidation.  No gross pneumothorax.  IMPRESSION: Cardiomegaly.  Pulmonary vascular congestion/mild pulmonary edema.  Calcified tortuous aorta.   Original Report Authenticated By: Lacy Duverney, M.D.    Mr Mra Head/brain Wo Cm  07/21/2012  *RADIOLOGY REPORT*  Clinical Data:  79 year old female with dizziness, nausea, vomiting, falls.  Comparison: Head CTs without contrast 07/20/2012.  Cervical MRI 01/26 of seven.  MRI HEAD WITHOUT CONTRAST  Technique: Multiplanar, multiecho pulse sequences of the brain and surrounding structures were obtained according to standard protocol without intravenous contrast.  Findings: No restricted diffusion to suggest acute infarction.  No midline shift, mass effect, evidence of mass lesion, ventriculomegaly, extra-axial collection or acute intracranial hemorrhage.  Cervicomedullary junction and pituitary are within normal limits.  Major intracranial vascular flow voids are preserved; dominant distal right vertebral artery.  Mild for age scattered cerebral white matter T2 and FLAIR hyperintensity.  No cortical encephalomalacia.  Occasional chronic micro hemorrhages (left occipital lobe, left cerebellar hemisphere).  Deep gray matter nuclei and brain stem are within normal limits.  Negative for age visualized cervical spine.  Normal bone marrow signal.  Postoperative changes to the globes. Visualized paranasal sinuses and mastoids are clear.  Grossly negative visualized internal auditory structures.  Negative scalp soft tissues.  IMPRESSION: 1. No acute intracranial abnormality. 2.  Mild for age nonspecific signal changes, most commonly due to chronic small vessel disease. 3.  See MRA findings below.  MRA HEAD WITHOUT CONTRAST  Technique: Angiographic images of the Circle of Willis were obtained using MRA technique without  intravenous contrast.   Findings: Antegrade flow in the posterior circulation with dominant distal right vertebral artery.  The left vertebral artery functionally terminates in PICA.  Normal right PICA.  Patent vertebrobasilar junction.  Mild basilar artery irregularity and tortuosity without stenosis.  Fetal right PCA origin.  SCA and left PCA origin are within normal limits.  Both proximal PCAs are tortuous.  No PCA stenosis.  The left posterior  communicating artery is diminutive.  Antegrade flow in both ICA siphons.  No ICA stenosis.  Ophthalmic and posterior communicating artery origins are within normal limits.  Patent carotid termini.  MCA and ACA origins are within normal limits.  Mild irregularity and stenosis of the left ACA A1 segment. Diminutive anterior communicating artery.  Other visualized ACA branches are within normal limits.  Mild right MCA M1 segment irregularity.  Visualized right MCA branches otherwise are within normal limits.  Left MCA M1 segment is normal to the bifurcation.  At the bifurcation there is a high-grade stenosis of the origin of the posterior sylvian division (series 605 image 4).  There is mild ectasia just beyond the stenosis without discrete aneurysm.  The downstream left M2 branches remain patent despite this finding. Other visualized left MCA branches are within normal limits.  IMPRESSION: 1.  High-grade stenosis at the left MCA bifurcation affecting the origin of the posterior M2 branch.  However, distal flow is preserved. 2.  Mild anterior circulation atherosclerosis otherwise. 3.  Mild posterior circulation tortuosity without stenosis.   Original Report Authenticated By: Erskine Speed, M.D.     MDM  Note Serum Cr fror 08/26/11 was 1.77  Spoke with Dr. Eben Burow Plan admit telemetry diuresis, nitrates. Suggest cardiology consult in AM Diagnosis#1 congestive heart failure #2chronic renal insufficiency      Doug Sou, MD 07/29/12 2127  Doug Sou, MD 07/29/12 2129

## 2012-07-29 NOTE — ED Notes (Signed)
[name] [phone]([id number]) Nobie Putnam [phone] (son in law)

## 2012-07-29 NOTE — ED Notes (Signed)
Pt has significant swelling to left elbow.

## 2012-07-29 NOTE — H&P (Signed)
History and Physical  Adrian Specht UJW:[id number] DOB: May 18, 1928 DOA: 07/29/2012  Referring physician: Dr. Ethelda Chick PCP: Crawford Givens, MD   Chief Complaint: Shortness of breath  HPI: [name] is a 79 y.o. female with multiple medical problems who was brought to the ED due to progressive shortness of breath. Patient was discharged to a skilled nursing facility on 07/23/2012. She complains off progressive worsening of leg edema associated with worsening shortness of breath since her admission to the skilled nursing facility. Patient had one episode of chest pain which lasted for one to 2 minutes was present in the middle of the chest, not associated with exertion, disappeared by itself, no exacerbating or relieving factors noted, no recurrence since that one episode. Skilled nursing facility was not comfortable to increase the dose of Lasix as patient has chronic renal insufficiency with a baseline creatinine of 1.8 and decided to bring the patient to ER. Patient also complains that he has gained about 3 pounds since her discharge weight which was 164 pounds but today she weighed about 167 pounds in the skilled nursing facility which is 3 pounds above her normal weight. Patient's renal function is at baseline and her INR is therapeutic at this time. ER physician wants to admit the patient to the medicine team for mild CHF exacerbation and possible cardiology consult as per the patient's daughter's request.   Review of Systems:  The patient denies anorexia, fever, chills, weight loss, vision loss, decreased hearing, hoarseness, chest pain, syncope, dyspnea on exertion, peripheral edema, balance deficits, hemoptysis, abdominal pain, melena, hematochezia, severe indigestion/heartburn, hematuria, incontinence, genital sores, muscle weakness, suspicious skin lesions, transient blindness, difficulty walking, depression, unusual weight change, abnormal bleeding, enlarged lymph nodes, angioedema, and breast  masses. Patient has swelling over her left elbow which has been going on for last 2 weeks.   Past Medical History  Diagnosis Date  . Hyperlipidemia   . Hypertension   . Anxiety   . GERD (gastroesophageal reflux disease)   . CAD (coronary artery disease)     Cath Aug. 2011 > medical therapy recommended  . Diastolic dysfunction     Grade II  . RBBB (right bundle branch block)     Chronic  . Paroxysmal atrial fibrillation   . Hypoxemic respiratory failure, chronic     uses 2.5 liters of oxygen with sleep  . COPD (chronic obstructive pulmonary disease)     GOLD 4 - PFT 06/08/10 FEV1 0.93 (62%), FEV1% 60, TLC 2.84 (69%0, DLCO 54%, +BD  . Secondary pulmonary hypertension   . Edema   . Angina   . Shortness of breath   . CHF (congestive heart failure)   . Diabetes mellitus     Type II - goal A1C is 8, to avoid hypoglycemia  . Bradycardia 08/24/2011    Past Surgical History  Procedure Date  . Rotator cuff repair 07/2005    right  . Abdominal hysterectomy   . Cardiac stents      x2    Social History:  reports that she quit smoking about 19 years ago. Her smoking use included Cigarettes. She has a 22.5 pack-year smoking history. She has never used smokeless tobacco. She reports that she drinks alcohol. She reports that she does not use illicit drugs.  Allergies  Allergen Reactions  . Atorvastatin Other (See Comments)     muscle aches  . Codeine Nausea And Vomiting  . Iohexol Other (See Comments)     Desc: unknown reaction; allergic to iodine and  contrast   . Morphine Nausea And Vomiting  . Rosuvastatin Other (See Comments)    muscle aches at high doses, held as of 12/2010 due to aches    Family History  Problem Relation Age of Onset  . Asthma Mother   . Cancer Mother     Skin  . Heart disease Mother   . Asthma Father   . Emphysema Father   . Asthma Brother   . Asthma Brother      Prior to Admission medications   Medication Sig Start Date End Date Taking?  Authorizing Provider  Aclidinium Bromide (TUDORZA PRESSAIR) 400 MCG/ACT AEPB Inhale 1 puff into the lungs 2 (two) times daily. Formulary sub for Spiriva per [facility]   Yes Historical Provider, MD  ALPRAZolam Prudy Feeler) 0.25 MG tablet Take 0.25 mg by mouth 2 (two) times daily as needed. for anxiety 07/13/12  Yes Joaquim Nam, MD  amiodarone (PACERONE) 200 MG tablet Take 200 mg by mouth every morning.    Yes Historical Provider, MD  aspirin 81 MG chewable tablet Chew 81 mg by mouth daily.   Yes Historical Provider, MD  atorvastatin (LIPITOR) 40 MG tablet Take 40 mg by mouth every evening.   Yes Historical Provider, MD  budesonide-formoterol (SYMBICORT) 160-4.5 MCG/ACT inhaler Inhale 2 puffs into the lungs 2 (two) times daily.     Yes Historical Provider, MD  doxycycline (VIBRA-TABS) 100 MG tablet Take 100 mg by mouth 2 (two) times daily. 7 day course started pm 07/26/12   Yes Historical Provider, MD  furosemide (LASIX) 20 MG tablet Take 20 mg by mouth daily.   Yes Historical Provider, MD  glucosamine-chondroitin 500-400 MG tablet Take 1 tablet by mouth 2 (two) times daily.   Yes Historical Provider, MD  HYDROcodone-acetaminophen (NORCO/VICODIN) 5-325 MG per tablet Take 1 tablet by mouth every 6 (six) hours as needed. For pain   Yes Historical Provider, MD  insulin lispro (HUMALOG) 100 UNIT/ML injection Inject 5 Units into the skin 3 (three) times daily as needed. If CBG over 200   Yes Historical Provider, MD  Ipratropium-Albuterol (COMBIVENT RESPIMAT) 20-100 MCG/ACT AERS respimat Inhale 1 puff into the lungs 4 (four) times daily.   Yes Historical Provider, MD  isosorbide mononitrate (IMDUR) 60 MG 24 hr tablet Take 60 mg by mouth daily.  07/07/11  Yes Joaquim Nam, MD  LEVEMIR FLEXPEN 100 UNIT/ML injection Inject 50 Units into the skin daily.  06/26/12  Yes Historical Provider, MD  LORazepam (ATIVAN) 0.5 MG tablet Take 0.5 mg by mouth at bedtime.   Yes Historical Provider, MD  methocarbamol (ROBAXIN) 500 MG  tablet Take 500 mg by mouth every 8 (eight) hours as needed. For muscle spasms   Yes Historical Provider, MD  metoprolol succinate (TOPROL-XL) 25 MG 24 hr tablet Take 12.5 mg by mouth daily. 09/01/11  Yes Joaquim Nam, MD  Multiple Vitamins-Minerals (PRESERVISION/LUTEIN) CAPS Take 1 capsule by mouth daily.    Yes Historical Provider, MD  pantoprazole (PROTONIX) 40 MG tablet Take 40 mg by mouth every morning.    Yes Historical Provider, MD  potassium chloride SA (KLOR-CON M20) 20 MEQ tablet Take 20 mEq by mouth daily.    Yes Historical Provider, MD  predniSONE (DELTASONE) 5 MG tablet Take 5 mg by mouth every evening. 7 day course started 07/26/12   Yes Historical Provider, MD  senna-docusate (SENOKOT-S) 8.6-50 MG per tablet Take 1 tablet by mouth 2 (two) times daily.   Yes Historical Provider, MD  vitamin  C (ASCORBIC ACID) 500 MG tablet Take 500 mg by mouth daily.   Yes Historical Provider, MD  warfarin (COUMADIN) 4 MG tablet Take 4 mg by mouth every evening.   Yes Historical Provider, MD  ULTIMA TEST test strip USE ONE THREE TIMES DAILY 08/08/11   Joaquim Nam, MD  VALUE PLUS LANCETS THIN 26G MISC Use as directed. 12/06/10   Joaquim Nam, MD   Physical Exam: Filed Vitals:   07/29/12 1916 07/29/12 1924 07/29/12 1943 07/29/12 1943  BP:  174/53 130/83   Pulse: 61  93   Temp: 97.1 F (36.2 C)     TempSrc: Oral     Resp: 20  16   SpO2:  99% 96% 99%    Physical Exam: General: Vital signs reviewed and noted. Well-developed, well-nourished, in no acute distress; alert, appropriate and cooperative throughout examination.  Head: Normocephalic, atraumatic.  Eyes: PERRL, EOMI, No signs of anemia or jaundince.  Nose: Mucous membranes moist, not inflammed, nonerythematous.  Throat: Oropharynx nonerythematous, no exudate appreciated.   Neck: No deformities, masses, or tenderness noted.Supple, No carotid Bruits, no JVD.  Lungs:  Normal respiratory effort. Decreased breath sounds on the left base,  crackles bilaterally up to mid lung   Heart:  is regularly irregular rate , S1 and S2 normal without gallop, murmur, or rubs.  Abdomen:  BS normoactive. Soft, Nondistended, non-tender.  No masses or organomegaly.  Extremities:  2+ pitting edema bilaterally up to knees   Neurologic: A&O X3, CN II - XII are grossly intact. Motor strength is 5/5 in the all 4 extremities, Sensations intact to light touch, Cerebellar signs negative.  Skin:  golf ball size swelling over the left elbow joint, fluctuant and without any tenderness or redness   ]  Wt Readings from Last 3 Encounters:  07/20/12 164 lb 3.2 oz (74.481 kg)  04/27/12 168 lb (76.204 kg)  03/12/12 166 lb 12 oz (75.637 kg)    Labs on Admission:  Basic Metabolic Panel:  Lab 07/29/12 3244  NA 144  K 4.0  CL 110  CO2 --  GLUCOSE 75  BUN 42*  CREATININE 1.80*  CALCIUM --  MG --  PHOS --    CBC:  Lab 07/29/12 2101 07/29/12 2002 07/23/12 0730  WBC -- 7.4 6.8  NEUTROABS -- 5.8 --  HGB 10.2* 10.1* 10.7*  HCT 30.0* 31.9* 33.4*  MCV -- 93.5 93.8  PLT -- 295 239     Troponin (Point of Care Test)  [facility] 07/29/12 2023  TROPIPOC 0.02    BNP (last 3 results)  Basename 07/20/12 1643 08/24/11 0544 08/23/11 1041  PROBNP 1039.0* 808.6* 1536.0*    CBG:  Lab 07/23/12 1206 07/23/12 0731  GLUCAP 177* 105*     Radiological Exams on Admission: Dg Chest Port 1 View  07/29/2012  *RADIOLOGY REPORT*  Clinical Data: Chest pain. Shortness of breath.  Diabetic hypertensive nonsmoker.  History of congestive heart failure.  PORTABLE CHEST - 1 VIEW  Comparison: 08/24/2011.  Findings: Cardiomegaly.  Pulmonary vascular congestion/mild pulmonary edema.  Calcified tortuous aorta.  No segmental consolidation.  No gross pneumothorax.  IMPRESSION: Cardiomegaly.  Pulmonary vascular congestion/mild pulmonary edema.  Calcified tortuous aorta.   Original Report Authenticated By: Lacy Duverney, M.D.     EKG: Independently reviewed. 61 beats per  minute, sinus rhythm, normal axis, first degree AV block, Q waves noted in 2, 3 and aVF. Unchanged as compared to previous EKG done on January 27th   Principal Problem:  *  Acute on chronic diastolic congestive heart failure Active Problems:  DM  HYPERLIPIDEMIA, statin intol  PAF, recurrance this admission, on chronic Amio/ Coumadin  DIASTOLIC DYSFUNCTION  COPD (chronic obstructive pulmonary disease)  Gait instability  HTN (hypertension)  CAD, remote RCA PCI, subsequently noted to be occluded, last cath 8/11  PVD (peripheral vascular disease), moderate carotid and LSCA disease  Chronic anticoagulation   Assessment/Plan  Patient is 79 year old female with past medical history most significant for diastolic dysfunction, COPD, diabetes, hypertension and recent admission for transient ischemic attack was admitted on January 24 and discharged to a skilled nursing facility on January 27 comes in today with chief complaints of progressive shortness of breath and increased swelling in feet consistent with acute exacerbation of congestive heart failure.  DIASTOLIC DYSFUNCTION (06/07/2010) - admit to telemetry - resume lasix at high dose, cont metoprolol. - ProBNP -Morning EKG and cycle troponins x 3 - Monitor renal function daily - Call cardiology in the morning as per the request from patient  Left elbow swelling -Ultrasound of the swelling -Possible surgery consult for management based on ultrasound results  Ataxia/frequen falls: - Needs a vascular surgery consult as an outpatient. Most likely multifactorial deconditioning and sacral fracture.  - ASA, statins.  - continue PT/OT  Acute sacral fracture:  - MRI of sacrum showed : Nondisplaced transverse fracture at the sacrococcygeal junction  - robaxin andnarcotics for pain.  - INR subthx, Hbg remained stable.  - PT/OT  DM (01/11/2010) -SSI sensitive - HbgA1c 8.3     COPD (chronic obstructive pulmonary disease) (06/16/2010) -  stable.  -continue home dose inhalers  HTN (hypertension) - continue home meds - follow up with PCP titrate BP meds as tolerate it.  Paroxysmal atrial fibrillation - Continue full dose anticoagulation and home medications   Code Status: Full Code Family Communication: Updated at bedside Disposition Plan/Anticipated LOS: To be decided  Time spent: 70 minutes  Lars Mage, MD  [facility] Team 10  If 7PM-7AM, please contact night-coverage at [email or web address], password [facility] 07/29/2012, 10:49 PM

## 2012-07-29 NOTE — ED Notes (Signed)
Pt no longer complaining of cp.

## 2012-07-29 NOTE — ED Notes (Signed)
Dr. Jacobuwitz at bedside 

## 2012-07-30 ENCOUNTER — Encounter (HOSPITAL_COMMUNITY): Payer: Self-pay | Admitting: *Deleted

## 2012-07-30 ENCOUNTER — Inpatient Hospital Stay (HOSPITAL_COMMUNITY): Payer: Medicare Other

## 2012-07-30 DIAGNOSIS — R279 Unspecified lack of coordination: Secondary | ICD-10-CM

## 2012-07-30 DIAGNOSIS — J449 Chronic obstructive pulmonary disease, unspecified: Secondary | ICD-10-CM

## 2012-07-30 DIAGNOSIS — I5033 Acute on chronic diastolic (congestive) heart failure: Principal | ICD-10-CM

## 2012-07-30 DIAGNOSIS — E1122 Type 2 diabetes mellitus with diabetic chronic kidney disease: Secondary | ICD-10-CM | POA: Diagnosis present

## 2012-07-30 DIAGNOSIS — J9610 Chronic respiratory failure, unspecified whether with hypoxia or hypercapnia: Secondary | ICD-10-CM

## 2012-07-30 DIAGNOSIS — R0902 Hypoxemia: Secondary | ICD-10-CM

## 2012-07-30 LAB — BASIC METABOLIC PANEL
CO2: 25 mEq/L (ref 19–32)
Calcium: 9.5 mg/dL (ref 8.4–10.5)
GFR calc non Af Amer: 32 mL/min — ABNORMAL LOW (ref >90)
Sodium: 143 mEq/L (ref 135–145)

## 2012-07-30 LAB — GLUCOSE, CAPILLARY
Glucose-Capillary: 186 mg/dL — ABNORMAL HIGH (ref 70–99)
Glucose-Capillary: 44 mg/dL — CL (ref 70–99)
Glucose-Capillary: 95 mg/dL (ref 70–99)

## 2012-07-30 LAB — PROTIME-INR
INR: 2.76 — ABNORMAL HIGH (ref 0.00–1.49)
Prothrombin Time: 27.8 seconds — ABNORMAL HIGH (ref 11.6–15.2)

## 2012-07-30 LAB — TROPONIN I: Troponin I: <0.3 ng/mL (ref <0.30)

## 2012-07-30 LAB — PRO B NATRIURETIC PEPTIDE: Pro B Natriuretic peptide (BNP): 1319 pg/mL — ABNORMAL HIGH (ref 0–450)

## 2012-07-30 LAB — CBC
Platelets: 291 10*3/uL (ref 150–400)
RBC: 3.42 MIL/uL — ABNORMAL LOW (ref 3.87–5.11)
WBC: 6.7 10*3/uL (ref 4.0–10.5)

## 2012-07-30 LAB — TSH: TSH: 2.319 u[IU]/mL (ref 0.350–4.500)

## 2012-07-30 MED ORDER — PANTOPRAZOLE SODIUM 40 MG PO TBEC
40.0000 mg | DELAYED_RELEASE_TABLET | Freq: Every day | ORAL | Status: DC
  Administered 2012-07-30 – 2012-07-31 (×2): 40 mg via ORAL
  Filled 2012-07-30 (×2): qty 1

## 2012-07-30 MED ORDER — METHOCARBAMOL 500 MG PO TABS: 500.0000 mg | ORAL_TABLET | Freq: Three times a day (TID) | ORAL | Status: DC | PRN

## 2012-07-30 MED ORDER — SENNOSIDES-DOCUSATE SODIUM 8.6-50 MG PO TABS
1.0000 | ORAL_TABLET | Freq: Two times a day (BID) | ORAL | Status: DC
  Administered 2012-07-30 – 2012-07-31 (×3): 1 via ORAL
  Filled 2012-07-30 (×5): qty 1

## 2012-07-30 MED ORDER — FUROSEMIDE 80 MG PO TABS
80.0000 mg | ORAL_TABLET | Freq: Two times a day (BID) | ORAL | Status: DC
  Administered 2012-07-30 – 2012-07-31 (×3): 80 mg via ORAL
  Filled 2012-07-30 (×6): qty 1

## 2012-07-30 MED ORDER — METOPROLOL SUCCINATE 12.5 MG HALF TABLET
12.5000 mg | ORAL_TABLET | Freq: Every day | ORAL | Status: DC
  Administered 2012-07-30 – 2012-07-31 (×2): 12.5 mg via ORAL
  Filled 2012-07-30 (×2): qty 1

## 2012-07-30 MED ORDER — ISOSORBIDE MONONITRATE ER 60 MG PO TB24
60.0000 mg | ORAL_TABLET | Freq: Every day | ORAL | Status: DC
  Administered 2012-07-30 – 2012-07-31 (×2): 60 mg via ORAL
  Filled 2012-07-30 (×2): qty 1

## 2012-07-30 MED ORDER — ACLIDINIUM BROMIDE 400 MCG/ACT IN AEPB
1.0000 | INHALATION_SPRAY | Freq: Two times a day (BID) | RESPIRATORY_TRACT | Status: DC
  Filled 2012-07-30: qty 1

## 2012-07-30 MED ORDER — AMIODARONE HCL 200 MG PO TABS
200.0000 mg | ORAL_TABLET | Freq: Every day | ORAL | Status: DC
  Administered 2012-07-30: 200 mg via ORAL
  Filled 2012-07-30 (×2): qty 1

## 2012-07-30 MED ORDER — SODIUM CHLORIDE 0.9 % IJ SOLN
3.0000 mL | Freq: Two times a day (BID) | INTRAMUSCULAR | Status: DC
  Administered 2012-07-30 – 2012-07-31 (×4): 3 mL via INTRAVENOUS

## 2012-07-30 MED ORDER — INSULIN ASPART 100 UNIT/ML ~~LOC~~ SOLN: 0.0000 [IU] | Freq: Every day | SUBCUTANEOUS | Status: DC

## 2012-07-30 MED ORDER — HYDROCODONE-ACETAMINOPHEN 5-325 MG PO TABS: 1.0000 | ORAL_TABLET | Freq: Four times a day (QID) | ORAL | Status: DC | PRN

## 2012-07-30 MED ORDER — INSULIN DETEMIR 100 UNIT/ML ~~LOC~~ SOLN
20.0000 [IU] | Freq: Every day | SUBCUTANEOUS | Status: DC
  Administered 2012-07-30: 20 [IU] via SUBCUTANEOUS
  Filled 2012-07-30: qty 10

## 2012-07-30 MED ORDER — SENNA 8.6 MG PO TABS: 1.0000 | ORAL_TABLET | Freq: Two times a day (BID) | ORAL | Status: DC

## 2012-07-30 MED ORDER — SODIUM CHLORIDE 0.9 % IV SOLN: 250.0000 mL | INTRAVENOUS | Status: DC | PRN

## 2012-07-30 MED ORDER — ASPIRIN 81 MG PO CHEW
81.0000 mg | CHEWABLE_TABLET | Freq: Every day | ORAL | Status: DC
  Administered 2012-07-30 – 2012-07-31 (×2): 81 mg via ORAL
  Filled 2012-07-30 (×2): qty 1

## 2012-07-30 MED ORDER — IPRATROPIUM-ALBUTEROL 20-100 MCG/ACT IN AERS
1.0000 | INHALATION_SPRAY | Freq: Four times a day (QID) | RESPIRATORY_TRACT | Status: DC
  Administered 2012-07-30: 1 via RESPIRATORY_TRACT
  Filled 2012-07-30: qty 4

## 2012-07-30 MED ORDER — WARFARIN - PHARMACIST DOSING INPATIENT: Freq: Every day | Status: DC

## 2012-07-30 MED ORDER — WARFARIN SODIUM 4 MG PO TABS
4.0000 mg | ORAL_TABLET | Freq: Every day | ORAL | Status: DC
  Administered 2012-07-30: 4 mg via ORAL
  Filled 2012-07-30 (×3): qty 1

## 2012-07-30 MED ORDER — POTASSIUM CHLORIDE CRYS ER 20 MEQ PO TBCR
20.0000 meq | EXTENDED_RELEASE_TABLET | Freq: Every day | ORAL | Status: DC
  Administered 2012-07-30 – 2012-07-31 (×2): 20 meq via ORAL
  Filled 2012-07-30 (×2): qty 1

## 2012-07-30 MED ORDER — SODIUM CHLORIDE 0.9 % IJ SOLN: 3.0000 mL | Freq: Two times a day (BID) | INTRAMUSCULAR | Status: DC

## 2012-07-30 MED ORDER — INSULIN ASPART 100 UNIT/ML ~~LOC~~ SOLN
0.0000 [IU] | Freq: Three times a day (TID) | SUBCUTANEOUS | Status: DC
  Administered 2012-07-30: 2 [IU] via SUBCUTANEOUS
  Administered 2012-07-30 – 2012-07-31 (×3): 1 [IU] via SUBCUTANEOUS

## 2012-07-30 MED ORDER — PREDNISONE 5 MG PO TABS
5.0000 mg | ORAL_TABLET | Freq: Every evening | ORAL | Status: DC
  Administered 2012-07-30: 5 mg via ORAL
  Filled 2012-07-30 (×2): qty 1

## 2012-07-30 MED ORDER — INSULIN DETEMIR 100 UNIT/ML ~~LOC~~ SOLN: 20.0000 [IU] | Freq: Every day | SUBCUTANEOUS | Status: DC

## 2012-07-30 MED ORDER — ALPRAZOLAM 0.25 MG PO TABS
0.2500 mg | ORAL_TABLET | Freq: Two times a day (BID) | ORAL | Status: DC | PRN
  Administered 2012-07-30 – 2012-07-31 (×2): 0.25 mg via ORAL
  Filled 2012-07-30 (×2): qty 1

## 2012-07-30 MED ORDER — BUDESONIDE-FORMOTEROL FUMARATE 160-4.5 MCG/ACT IN AERO
2.0000 | INHALATION_SPRAY | Freq: Two times a day (BID) | RESPIRATORY_TRACT | Status: DC
  Administered 2012-07-30 – 2012-07-31 (×3): 2 via RESPIRATORY_TRACT
  Filled 2012-07-30: qty 6

## 2012-07-30 MED ORDER — TIOTROPIUM BROMIDE MONOHYDRATE 18 MCG IN CAPS
18.0000 ug | ORAL_CAPSULE | Freq: Every day | RESPIRATORY_TRACT | Status: DC
  Administered 2012-07-31: 18 ug via RESPIRATORY_TRACT
  Filled 2012-07-30: qty 5

## 2012-07-30 MED ORDER — LORAZEPAM 0.5 MG PO TABS
0.5000 mg | ORAL_TABLET | Freq: Every day | ORAL | Status: DC
  Administered 2012-07-30: 0.5 mg via ORAL
  Filled 2012-07-30: qty 1

## 2012-07-30 MED ORDER — SODIUM CHLORIDE 0.9 % IJ SOLN: 3.0000 mL | INTRAMUSCULAR | Status: DC | PRN

## 2012-07-30 NOTE — Progress Notes (Signed)
Clinical Social Work Department BRIEF PSYCHOSOCIAL ASSESSMENT 07/30/2012  Patient:  [name], [name]     Account Number:  [id number]     Admit date:  07/29/2012  Clinical Social Worker:  Robin Searing  Date/Time:  07/30/2012 02:40 PM  Referred by:  Physician  Date Referred:  07/30/2012 Referred for  SNF Placement   Other Referral:   Interview type:  Family Other interview type:   karen- daughter    PSYCHOSOCIAL DATA Living Status:  FACILITY Admitted from facility:  [facility] Level of care:  Skilled Nursing Facility Primary support name:  [name]- dtr Primary support relationship to patient:  FAMILY Degree of support available:   good    CURRENT CONCERNS Current Concerns  Post-Acute Placement   Other Concerns:    SOCIAL WORK ASSESSMENT / PLAN Patient admitted from [facility]- was recently admitted there from here-  plan is for return at d/c   Assessment/plan status:  Other - See comment Other assessment/ plan:   Information/referral to community resources:   SNF  EMS  BLUE MEDICARE    PATIENT'S/FAMILY'S RESPONSE TO PLAN OF CARE: Planis for return to SNF unit where patient was admitted recently (last week) at d/c. Awaiting medical clearance.   Reece Levy, MSW, Theresia Majors [phone]

## 2012-07-30 NOTE — Consult Note (Signed)
Reason for Consult: edema, diastolic HF   Referring Physician: Dr. Lavera Guise TRH  [name] is an 79 y.o. female.    Chief Complaint: pt admitted 07/29/12 with SOB  HPI: 79 y.o. female with multiple medical problems who was brought to the ED due to progressive shortness of breath. Patient was discharged to a skilled nursing facility on 07/23/2012. She complains of progressive worsening of leg edema associated with worsening shortness of breath since her admission to the skilled nursing facility. Patient had one episode of chest pain which lasted for one to 2 minutes was present in the middle of the chest, not associated with exertion, disappeared by itself, no exacerbating or relieving factors noted, no recurrence since that one episode. Skilled nursing facility was not comfortable to increase the dose of Lasix as patient has chronic renal insufficiency with a baseline creatinine of 1.8 and decided to bring the patient to ER. Patient also complained that she has gained about 3 pounds since her discharge weight which was 164 pounds but on admit she weighed about 167 pounds in the skilled nursing facility which is 3 pounds above her normal weight. Patient's renal function was at baseline and her INR is therapeutic at this time. ER physician wanted to admit the patient to the medicine team for mild CHF exacerbation and possible cardiology consult as per the patient's daughter's request.     No weight this AM.  Pro BNP at [facility]  1319  Last Pro BNP 3 days prior to discharge 1039.  Her baseline in 2/13 is 808. I&O +240  She rec'd 20 mg IV in ER and is now on 80 mg po BID (outpatient dose was 20 mg daily).  Pt. Denies SOB this am, no chest pain.   Last 2 d Echo: 07/21/12: Study Conclusions  - Left ventricle: The cavity size was normal. Systolic function was normal. The estimated ejection fraction was in the range of 55% to 60%. Although no diagnostic regional wall motion abnormality was identified,  this possibility cannot be completely excluded on the basis of this study. Features are consistent with a pseudonormal left ventricular filling pattern, with concomitant abnormal relaxation and increased filling pressure (grade 2 diastolic dysfunction). - Mitral valve: Mild to moderate regurgitation. - Left atrium: The atrium was mildly dilated. Impressions:  - No cardiac source of emboli was indentified. Compared to the prior study, there has been no significant interval change.  Last cath 01/2010.  Hx PAF on coumadin.     Past Medical History  Diagnosis Date  . Hyperlipidemia   . Hypertension   . Anxiety   . GERD (gastroesophageal reflux disease)   . CAD (coronary artery disease)     Cath Aug. 2011 > medical therapy recommended  . Diastolic dysfunction     Grade II  . RBBB (right bundle branch block)     Chronic  . Paroxysmal atrial fibrillation   . Hypoxemic respiratory failure, chronic     uses 2.5 liters of oxygen with sleep  . COPD (chronic obstructive pulmonary disease)     GOLD 4 - PFT 06/08/10 FEV1 0.93 (62%), FEV1% 60, TLC 2.84 (69%0, DLCO 54%, +BD  . Secondary pulmonary hypertension   . Edema   . Angina   . Shortness of breath   . CHF (congestive heart failure)   . Diabetes mellitus     Type II - goal A1C is 8, to avoid hypoglycemia  . Bradycardia 08/24/2011    Past Surgical History  Procedure Date  .  Rotator cuff repair 07/2005    right  . Abdominal hysterectomy   . Cardiac stents      x2  . Cardiac catheterization   . Coronary angioplasty     Family History  Problem Relation Age of Onset  . Asthma Mother   . Cancer Mother     Skin  . Heart disease Mother   . Asthma Father   . Emphysema Father   . Asthma Brother   . Asthma Brother    Social History:  reports that she quit smoking about 19 years ago. Her smoking use included Cigarettes. She has a 22.5 pack-year smoking history. She has never used smokeless tobacco. She reports that she  drinks alcohol. She reports that she does not use illicit drugs. Has been at skilled nursing facility.  Allergies:  Allergies  Allergen Reactions  . Atorvastatin Other (See Comments)     muscle aches  . Codeine Nausea And Vomiting  . Iohexol Other (See Comments)     Desc: unknown reaction; allergic to iodine and contrast   . Morphine Nausea And Vomiting  . Rosuvastatin Other (See Comments)    muscle aches at high doses, held as of 12/2010 due to aches    Medications Prior to Admission  Medication Sig Dispense Refill  . Aclidinium Bromide (TUDORZA PRESSAIR) 400 MCG/ACT AEPB Inhale 1 puff into the lungs 2 (two) times daily. Formulary sub for Spiriva per MAR      . ALPRAZolam (XANAX) 0.25 MG tablet Take 0.25 mg by mouth 2 (two) times daily as needed. for anxiety      . amiodarone (PACERONE) 200 MG tablet Take 200 mg by mouth every morning.       Marland Kitchen aspirin 81 MG chewable tablet Chew 81 mg by mouth daily.      Marland Kitchen atorvastatin (LIPITOR) 40 MG tablet Take 40 mg by mouth every evening.      . budesonide-formoterol (SYMBICORT) 160-4.5 MCG/ACT inhaler Inhale 2 puffs into the lungs 2 (two) times daily.        Marland Kitchen doxycycline (VIBRA-TABS) 100 MG tablet Take 100 mg by mouth 2 (two) times daily. 7 day course started pm 07/26/12      . furosemide (LASIX) 20 MG tablet Take 20 mg by mouth daily.      Marland Kitchen glucosamine-chondroitin 500-400 MG tablet Take 1 tablet by mouth 2 (two) times daily.      Marland Kitchen HYDROcodone-acetaminophen (NORCO/VICODIN) 5-325 MG per tablet Take 1 tablet by mouth every 6 (six) hours as needed. For pain      . insulin lispro (HUMALOG) 100 UNIT/ML injection Inject 5 Units into the skin 3 (three) times daily as needed. If CBG over 200      . Ipratropium-Albuterol (COMBIVENT RESPIMAT) 20-100 MCG/ACT AERS respimat Inhale 1 puff into the lungs 4 (four) times daily.      . isosorbide mononitrate (IMDUR) 60 MG 24 hr tablet Take 60 mg by mouth daily.       Marland Kitchen LEVEMIR FLEXPEN 100 UNIT/ML injection Inject  50 Units into the skin daily.       Marland Kitchen LORazepam (ATIVAN) 0.5 MG tablet Take 0.5 mg by mouth at bedtime.      . methocarbamol (ROBAXIN) 500 MG tablet Take 500 mg by mouth every 8 (eight) hours as needed. For muscle spasms      . metoprolol succinate (TOPROL-XL) 25 MG 24 hr tablet Take 12.5 mg by mouth daily.      . Multiple Vitamins-Minerals (PRESERVISION/LUTEIN) CAPS Take  1 capsule by mouth daily.       . pantoprazole (PROTONIX) 40 MG tablet Take 40 mg by mouth every morning.       . potassium chloride SA (KLOR-CON M20) 20 MEQ tablet Take 20 mEq by mouth daily.       . predniSONE (DELTASONE) 5 MG tablet Take 5 mg by mouth every evening. 7 day course started 07/26/12      . senna-docusate (SENOKOT-S) 8.6-50 MG per tablet Take 1 tablet by mouth 2 (two) times daily.      . vitamin C (ASCORBIC ACID) 500 MG tablet Take 500 mg by mouth daily.      Marland Kitchen warfarin (COUMADIN) 4 MG tablet Take 4 mg by mouth every evening.      Marland Kitchen ULTIMA TEST test strip USE ONE THREE TIMES DAILY  300 each  PRN  . VALUE PLUS LANCETS THIN 26G MISC Use as directed.  200 each  11    Results for orders placed during the hospital encounter of 07/29/12 (from the past 48 hour(s))  CBC WITH DIFFERENTIAL     Status: Abnormal   Collection Time   07/29/12  8:02 PM      Component Value Range Comment   WBC 7.4  4.0 - 10.5 K/uL    RBC 3.41 (*) 3.87 - 5.11 MIL/uL    Hemoglobin 10.1 (*) 12.0 - 15.0 g/dL    HCT 21.3 (*) 08.6 - 46.0 %    MCV 93.5  78.0 - 100.0 fL    MCH 29.6  26.0 - 34.0 pg    MCHC 31.7  30.0 - 36.0 g/dL    RDW 57.8  46.9 - 62.9 %    Platelets 295  150 - 400 K/uL    Neutrophils Relative 78 (*) 43 - 77 %    Neutro Abs 5.8  1.7 - 7.7 K/uL    Lymphocytes Relative 14  12 - 46 %    Lymphs Abs 1.0  0.7 - 4.0 K/uL    Monocytes Relative 7  3 - 12 %    Monocytes Absolute 0.5  0.1 - 1.0 K/uL    Eosinophils Relative 0  0 - 5 %    Eosinophils Absolute 0.0  0.0 - 0.7 K/uL    Basophils Relative 1  0 - 1 %    Basophils Absolute  0.1  0.0 - 0.1 K/uL   PROTIME-INR     Status: Abnormal   Collection Time   07/29/12  8:02 PM      Component Value Range Comment   Prothrombin Time 26.0 (*) 11.6 - 15.2 seconds    INR 2.52 (*) 0.00 - 1.49   POCT I-STAT TROPONIN I     Status: Normal   Collection Time   07/29/12  8:23 PM      Component Value Range Comment   Troponin i, poc 0.02  0.00 - 0.08 ng/mL    Comment 3            POCT I-STAT, CHEM 8     Status: Abnormal   Collection Time   07/29/12  9:01 PM      Component Value Range Comment   Sodium 144  135 - 145 mEq/L    Potassium 4.0  3.5 - 5.1 mEq/L    Chloride 110  96 - 112 mEq/L    BUN 42 (*) 6 - 23 mg/dL    Creatinine, Ser 5.28 (*) 0.50 - 1.10 mg/dL    Glucose, Bld 75  70 - 99 mg/dL    Calcium, Ion 1.61  0.96 - 1.30 mmol/L    TCO2 25  0 - 100 mmol/L    Hemoglobin 10.2 (*) 12.0 - 15.0 g/dL    HCT 04.5 (*) 40.9 - 46.0 %   GLUCOSE, CAPILLARY     Status: Abnormal   Collection Time   07/29/12 11:44 PM      Component Value Range Comment   Glucose-Capillary 44 (*) 70 - 99 mg/dL   GLUCOSE, CAPILLARY     Status: Normal   Collection Time   07/30/12 12:23 AM      Component Value Range Comment   Glucose-Capillary 95  70 - 99 mg/dL   PRO B NATRIURETIC PEPTIDE     Status: Abnormal   Collection Time   07/30/12 12:26 AM      Component Value Range Comment   Pro B Natriuretic peptide (BNP) 1319.0 (*) 0 - 450 pg/mL   TROPONIN I     Status: Normal   Collection Time   07/30/12 12:26 AM      Component Value Range Comment   Troponin I <0.30  <0.30 ng/mL   BASIC METABOLIC PANEL     Status: Abnormal   Collection Time   07/30/12  6:00 AM      Component Value Range Comment   Sodium 143  135 - 145 mEq/L    Potassium 3.6  3.5 - 5.1 mEq/L    Chloride 106  96 - 112 mEq/L    CO2 25  19 - 32 mEq/L    Glucose, Bld 71  70 - 99 mg/dL    BUN 40 (*) 6 - 23 mg/dL    Creatinine, Ser 8.11 (*) 0.50 - 1.10 mg/dL    Calcium 9.5  8.4 - 91.4 mg/dL    GFR calc non Af Amer 32 (*) >90 mL/min    GFR calc Af  Amer 37 (*) >90 mL/min   CBC     Status: Abnormal   Collection Time   07/30/12  6:00 AM      Component Value Range Comment   WBC 6.7  4.0 - 10.5 K/uL    RBC 3.42 (*) 3.87 - 5.11 MIL/uL    Hemoglobin 10.2 (*) 12.0 - 15.0 g/dL    HCT 78.2 (*) 95.6 - 46.0 %    MCV 94.7  78.0 - 100.0 fL    MCH 29.8  26.0 - 34.0 pg    MCHC 31.5  30.0 - 36.0 g/dL    RDW 21.3  08.6 - 57.8 %    Platelets 291  150 - 400 K/uL   PROTIME-INR     Status: Abnormal   Collection Time   07/30/12  6:00 AM      Component Value Range Comment   Prothrombin Time 27.8 (*) 11.6 - 15.2 seconds    INR 2.76 (*) 0.00 - 1.49   TROPONIN I     Status: Normal   Collection Time   07/30/12  6:02 AM      Component Value Range Comment   Troponin I <0.30  <0.30 ng/mL   GLUCOSE, CAPILLARY     Status: Abnormal   Collection Time   07/30/12  8:38 AM      Component Value Range Comment   Glucose-Capillary 140 (*) 70 - 99 mg/dL    Comment 1 Notify RN      Dg Chest Port 1 View  07/29/2012  *RADIOLOGY REPORT*  Clinical Data: Chest pain. Shortness  of breath.  Diabetic hypertensive nonsmoker.  History of congestive heart failure.  PORTABLE CHEST - 1 VIEW  Comparison: 08/24/2011.  Findings: Cardiomegaly.  Pulmonary vascular congestion/mild pulmonary edema.  Calcified tortuous aorta.  No segmental consolidation.  No gross pneumothorax.  IMPRESSION: Cardiomegaly.  Pulmonary vascular congestion/mild pulmonary edema.  Calcified tortuous aorta.   Original Report Authenticated By: Lacy Duverney, M.D.     ROS: General:no colds or fevers, no weight changes Skin:no rashes or ulcers HEENT:no blurred vision, no congestion CV:see HPI PUL:see HPI GI:no diarrhea constipation or melena, no indigestion GU:no hematuria, no dysuria MS:no joint pain, no claudication Neuro:no syncope, no lightheadedness Endo:+ diabetes, no thyroid disease   Blood pressure 148/70, pulse 48, temperature 97.9 F (36.6 C), temperature source Oral, resp. rate 20, height 5\' 2"  (1.575  m), weight 75.025 kg (165 lb 6.4 oz), SpO2 95.00%. PE: General:alert and oriented, no complaints, except the bed alarm Skin:W&D brisk capillary refill  HEENT:normocepalic, sclera clear Neck:supple, +bil carotid bruits, no JVD sitting up right Heart:S1S2, soft systolic murmur Lungs:clear without rales or rhonchi Abd:+ BS, soft non tender Ext:1+ edema of lower ext. To 0.5+ Neuro:alert and oriented, follows commands.    Assessment/Plan Principal Problem:  *Acute on chronic diastolic congestive heart failure Active Problems:  DM  HYPERLIPIDEMIA, statin intol  PAF, recurrance this admission, on chronic Amio/ Coumadin  DIASTOLIC DYSFUNCTION  COPD (chronic obstructive pulmonary disease)  Chronic respiratory failure with hypoxia  Gait instability  HTN (hypertension)  RBBB  CAD, remote RCA PCI, subsequently noted to be occluded, last cath 8/11  PVD (peripheral vascular disease), moderate carotid and LSCA disease  Chronic anticoagulation, on coumadin  PLAN: Pro BNP is elevated her dry Pro BNP may be 800 according to previous labs.   1 year ago wt was 158.6.pounds.  MD to see. Once edema resolved she may need 40 mg lasix daily with diet at SNF including sausage.  Improved from yesterday.  Pt hopes to return to home and not SNF.   Ronav Furney R 07/30/2012, 10:17 AM

## 2012-07-30 NOTE — Progress Notes (Signed)
[facility] PROGRESS NOTE  [name] ZOX:[id number] DOB: 01-24-1928 DOA: 07/29/2012 PCP: Crawford Givens, MD  Assessment/Plan: 1. Acute on chronic diastolic CHF - patient was placed on increase furosemide dose on admission- Cardiology consult was called and I will defer to them further medication adjustments.  2. HL - continued on Lipitor  3. HTN  4. Anxiety 5. Ataxia, falls - patient to return to snf at [place] 6. DM type 2 insulin requiring - with hypoglycemia. Normally on levemir 50 units q am . Resume levemir at 20 units QHS on 07/30/12  7. COPD - regimen consolidated to Spiriva and Symbicort 8. Bradycardia - asymptomatic. Consider DC BB and decreasing amiodarone dose to 100 mg daily  - defer to cards.    9. CKD stage 3 - monitor creatinine closely with diuresis . avoi nephrotoxins  10. afib on chronic coumadin  11. Bilat carotid artery stenoses - plans were made for outpatient f/u with [facility] during previous admission    Code Status: full  Family Communication: patient  Disposition Plan: back to snf   Consultants:  SEH[facility]  Procedures:  none  HPI/Subjective: Feels well   Objective: Filed Vitals:   07/30/12 0600 07/30/12 0818 07/30/12 1028 07/30/12 1354  BP: 148/70  148/71 121/63  Pulse: 48  58 53  Temp:    98 F (36.7 C)  TempSrc:    Oral  Resp:    18  Height:      Weight:      SpO2:  95%  97%    Intake/Output Summary (Last 24 hours) at 07/30/12 1436 Last data filed at 07/30/12 0900  Gross per 24 hour  Intake    480 ml  Output      0 ml  Net    480 ml   Filed Weights   07/29/12 2347  Weight: 75.025 kg (165 lb 6.4 oz)    Exam:   General:  axox3  Cardiovascular: bradycardic , regular   Respiratory: ctab   Abdomen: soft, nt   LE +3 edema   Data Reviewed: Basic Metabolic Panel:  Lab 07/30/12 8119 07/29/12 2101  NA 143 144  K 3.6 4.0  CL 106 110  CO2 25 --  GLUCOSE 71 75  BUN 40* 42*  CREATININE 1.48* 1.80*  CALCIUM 9.5 --  MG -- --   PHOS -- --   Liver Function Tests: No results found for this basename: AST:5,ALT:5,ALKPHOS:5,BILITOT:5,PROT:5,ALBUMIN:5 in the last 168 hours No results found for this basename: LIPASE:5,AMYLASE:5 in the last 168 hours No results found for this basename: AMMONIA:5 in the last 168 hours CBC:  Lab 07/30/12 0600 07/29/12 2101 07/29/12 2002  WBC 6.7 -- 7.4  NEUTROABS -- -- 5.8  HGB 10.2* 10.2* 10.1*  HCT 32.4* 30.0* 31.9*  MCV 94.7 -- 93.5  PLT 291 -- 295   Cardiac Enzymes:  Lab 07/30/12 1145 07/30/12 0602 07/30/12 0026  CKTOTAL -- -- --  CKMB -- -- --  CKMBINDEX -- -- --  TROPONINI <0.30 <0.30 <0.30   BNP (last 3 results)  Basename 07/30/12 0026 07/20/12 1643 08/24/11 0544  PROBNP 1319.0* 1039.0* 808.6*   CBG:  Lab 07/30/12 1135 07/30/12 0838 07/30/12 0023 07/29/12 2344  GLUCAP 100* 140* 95 44*    No results found for this or any previous visit (from the past 240 hour(s)).   Studies: Dg Chest 2 View  07/30/2012  *RADIOLOGY REPORT*  Clinical Data: Shortness of breath.  CHEST - 2 VIEW  Comparison: 07/29/2012 and 06/04/2010  Findings: There is chronic cardiomegaly.  There is slight pulmonary vascular prominence with tiny bilateral pleural effusions.  No infiltrates.  Chronic prominence of the inferior aspect of the right hilum, unchanged since 06/04/2010.  Chronic accentuation of the thoracic kyphosis.  No acute osseous abnormality.  IMPRESSION: Chronic cardiomegaly with mild pulmonary vascular prominence and a tiny bilateral pleural effusions.  This could represent slight congestive heart failure.   Original Report Authenticated By: Francene Boyers, M.D.    Dg Chest Port 1 View  07/29/2012  *RADIOLOGY REPORT*  Clinical Data: Chest pain. Shortness of breath.  Diabetic hypertensive nonsmoker.  History of congestive heart failure.  PORTABLE CHEST - 1 VIEW  Comparison: 08/24/2011.  Findings: Cardiomegaly.  Pulmonary vascular congestion/mild pulmonary edema.  Calcified tortuous aorta.   No segmental consolidation.  No gross pneumothorax.  IMPRESSION: Cardiomegaly.  Pulmonary vascular congestion/mild pulmonary edema.  Calcified tortuous aorta.   Original Report Authenticated By: Lacy Duverney, M.D.     Scheduled Meds:    . amiodarone  200 mg Oral Daily  . aspirin  81 mg Oral Daily  . budesonide-formoterol  2 puff Inhalation BID  . furosemide  80 mg Oral BID  . insulin aspart  0-5 Units Subcutaneous QHS  . insulin aspart  0-9 Units Subcutaneous TID WC  . isosorbide mononitrate  60 mg Oral Daily  . LORazepam  0.5 mg Oral QHS  . metoprolol succinate  12.5 mg Oral Daily  . pantoprazole  40 mg Oral QAC breakfast  . potassium chloride SA  20 mEq Oral Daily  . predniSONE  5 mg Oral QPM  . senna-docusate  1 tablet Oral BID  . sodium chloride  3 mL Intravenous Q12H  . sodium chloride  3 mL Intravenous Q12H  . tiotropium  18 mcg Inhalation Daily  . warfarin  4 mg Oral q1800  . Warfarin - Pharmacist Dosing Inpatient   Does not apply q1800   Continuous Infusions:   Principal Problem:  *Acute on chronic diastolic congestive heart failure Active Problems:  DM  HYPERLIPIDEMIA, statin intol  PAF, recurrance this admission, on chronic Amio/ Coumadin  DIASTOLIC DYSFUNCTION  COPD (chronic obstructive pulmonary disease)  Chronic respiratory failure with hypoxia  Gait instability  HTN (hypertension)  RBBB  CAD, remote RCA PCI, subsequently noted to be occluded, last cath 8/11  PVD (peripheral vascular disease), moderate carotid and LSCA disease  Chronic anticoagulation, on coumadin     Aleks Nawrot  [facility] Pager [phone]. If 8PM-8AM, please contact night-coverage at [email or web address], password [facility] 07/30/2012, 2:36 PM  LOS: 1 day

## 2012-07-30 NOTE — Evaluation (Signed)
Occupational Therapy Evaluation Patient Details Name: [name] MRN: [id number] DOB: 01-23-1928 Today's Date: 07/30/2012 Time: 1212-1232 OT Time Calculation (min): 20 min  OT Assessment / Plan / Recommendation Clinical Impression  79 yo female admitted from SN[facility] due to incr SOB and incr edema in BIL LE. Pt does not require skilled OT acutely. Ot to sign off. REcommend return to [facility] place    OT Assessment  Patient does not need any further OT services    Follow Up Recommendations  SNF    Barriers to Discharge      Equipment Recommendations       Recommendations for Other Services    Frequency       Precautions / Restrictions Precautions Precautions: Fall   Pertinent Vitals/Pain     ADL  Eating/Feeding: Modified independent Where Assessed - Eating/Feeding: Chair Grooming: Wash/dry hands;Wash/dry face;Modified independent Where Assessed - Grooming: Supported sitting (Supervision standing) Toilet Transfer: Modified independent Statistician Method: Sit to Barista: Regular height toilet Equipment Used: Rolling walker (abandoning RW throughout session) Transfers/Ambulation Related to ADLs: Pt prefer to ambulate without DME> Pt abandoned RW x2 during session. pt Supervision with ambulation  ADL Comments: Pt is able to don makeup, wash hands at sink and complete toilet transfer during session. Pt does not require acute OT at this time . Ot recommend pt ambulate with RN / staff to incr activity tolerance Ot to sign off    OT Diagnosis:    OT Problem List:   OT Treatment Interventions:     OT Goals    Visit Information  Last OT Received On: 07/30/12 Assistance Needed: +1    Subjective Data  Subjective: "Can you tell them to let me get out of that bed? I dont have to stay in there all day do I ? It hurts where I fractured that coccyx bone" Patient Stated Goal: to return to [place] [facility] in two days or less   Prior  Functioning     Home Living Lives With: Alone Available Help at Discharge: Family;Available PRN/intermittently Type of Home: Apartment Home Access: Level entry Home Layout: One level Bathroom Shower/Tub: Engineer, manufacturing systems: Standard Home Adaptive Equipment: Dan Humphreys - four wheeled;Straight cane;Shower chair with back Additional Comments: Pt currently from [facility] after recent hospitalization Prior Function Level of Independence: Independent with assistive device(s);Needs assistance Needs Assistance: Light Housekeeping Light Housekeeping: Moderate Able to Take Stairs?: No Driving: No Vocation: Retired Comments: Pt reports wearing oxgyen intermittent throughout the day at [place] place and all the time at night. Pt uses rollator at [employer] place Communication Communication: HOH Dominant Hand: Right         Vision/Perception Vision - History Visual History: Macular degeneration   Cognition  Cognition Overall Cognitive Status: Impaired Area of Impairment: Safety/judgement;Awareness of deficits Arousal/Alertness: Awake/alert Orientation Level: Appears intact for tasks assessed Behavior During Session: [facility] for tasks performed Safety/Judgement: Impulsive;Decreased safety judgement for tasks assessed    Extremity/Trunk Assessment Right Upper Extremity Assessment RUE ROM/Strength/Tone: Within functional levels RUE Sensation: WFL - Light Touch RUE Coordination: WFL - gross/fine motor Left Upper Extremity Assessment LUE ROM/Strength/Tone: Within functional levels LUE Sensation: WFL - Light Touch LUE Coordination: WFL - gross/fine motor Trunk Assessment Trunk Assessment: Normal     Mobility Transfers Sit to Stand: 5: Supervision;From chair/3-in-1;With upper extremity assist Stand to Sit: 5: Supervision;With upper extremity assist;To chair/3-in-1 Details for Transfer Assistance: cues for safety with RW and abandoning RW throughout session  Exercise      Balance     End of Session OT - End of Session Activity Tolerance: Patient tolerated treatment well Patient left: in chair;with call bell/phone within reach Nurse Communication: Mobility status;Precautions  GO     Lucile Shutters 07/30/2012, 1:13 PM Pager: [phone]

## 2012-07-30 NOTE — Progress Notes (Signed)
ANTICOAGULATION CONSULT NOTE - Initial Consult  Pharmacy Consult for Coumadin Indication: atrial fibrillation  Allergies  Allergen Reactions  . Atorvastatin Other (See Comments)     muscle aches  . Codeine Nausea And Vomiting  . Iohexol Other (See Comments)     Desc: unknown reaction; allergic to iodine and contrast   . Morphine Nausea And Vomiting  . Rosuvastatin Other (See Comments)    muscle aches at high doses, held as of 12/2010 due to aches    Patient Measurements: Height: 5\' 2"  (157.5 cm) Weight: 165 lb 6.4 oz (75.025 kg) IBW/kg (Calculated) : 50.1   Vital Signs: Temp: 97.3 F (36.3 C) (02/02 2347) Temp src: Oral (02/02 1916) BP: 113/73 mmHg (02/02 2347) Pulse Rate: 55  (02/02 2347)  Labs:  Basename 07/30/12 0026 07/29/12 2101 07/29/12 2002  HGB -- 10.2* 10.1*  HCT -- 30.0* 31.9*  PLT -- -- 295  APTT -- -- --  LABPROT -- -- 26.0*  INR -- -- 2.52*  HEPARINUNFRC -- -- --  CREATININE -- 1.80* --  CKTOTAL -- -- --  CKMB -- -- --  TROPONINI <0.30 -- --    Estimated Creatinine Clearance: 22.9 ml/min (by C-G formula based on Cr of 1.8).   Medical History: Past Medical History  Diagnosis Date  . Hyperlipidemia   . Hypertension   . Anxiety   . GERD (gastroesophageal reflux disease)   . CAD (coronary artery disease)     Cath Aug. 2011 > medical therapy recommended  . Diastolic dysfunction     Grade II  . RBBB (right bundle branch block)     Chronic  . Paroxysmal atrial fibrillation   . Hypoxemic respiratory failure, chronic     uses 2.5 liters of oxygen with sleep  . COPD (chronic obstructive pulmonary disease)     GOLD 4 - PFT 06/08/10 FEV1 0.93 (62%), FEV1% 60, TLC 2.84 (69%0, DLCO 54%, +BD  . Secondary pulmonary hypertension   . Edema   . Angina   . Shortness of breath   . CHF (congestive heart failure)   . Diabetes mellitus     Type II - goal A1C is 8, to avoid hypoglycemia  . Bradycardia 08/24/2011    Medications:  Prescriptions prior to  admission  Medication Sig Dispense Refill  . Aclidinium Bromide (TUDORZA PRESSAIR) 400 MCG/ACT AEPB Inhale 1 puff into the lungs 2 (two) times daily. Formulary sub for Spiriva per MAR      . ALPRAZolam (XANAX) 0.25 MG tablet Take 0.25 mg by mouth 2 (two) times daily as needed. for anxiety      . amiodarone (PACERONE) 200 MG tablet Take 200 mg by mouth every morning.       Marland Kitchen aspirin 81 MG chewable tablet Chew 81 mg by mouth daily.      Marland Kitchen atorvastatin (LIPITOR) 40 MG tablet Take 40 mg by mouth every evening.      . budesonide-formoterol (SYMBICORT) 160-4.5 MCG/ACT inhaler Inhale 2 puffs into the lungs 2 (two) times daily.        Marland Kitchen doxycycline (VIBRA-TABS) 100 MG tablet Take 100 mg by mouth 2 (two) times daily. 7 day course started pm 07/26/12      . furosemide (LASIX) 20 MG tablet Take 20 mg by mouth daily.      Marland Kitchen glucosamine-chondroitin 500-400 MG tablet Take 1 tablet by mouth 2 (two) times daily.      Marland Kitchen HYDROcodone-acetaminophen (NORCO/VICODIN) 5-325 MG per tablet Take 1 tablet by mouth every  6 (six) hours as needed. For pain      . insulin lispro (HUMALOG) 100 UNIT/ML injection Inject 5 Units into the skin 3 (three) times daily as needed. If CBG over 200      . Ipratropium-Albuterol (COMBIVENT RESPIMAT) 20-100 MCG/ACT AERS respimat Inhale 1 puff into the lungs 4 (four) times daily.      . isosorbide mononitrate (IMDUR) 60 MG 24 hr tablet Take 60 mg by mouth daily.       Marland Kitchen LEVEMIR FLEXPEN 100 UNIT/ML injection Inject 50 Units into the skin daily.       Marland Kitchen LORazepam (ATIVAN) 0.5 MG tablet Take 0.5 mg by mouth at bedtime.      . methocarbamol (ROBAXIN) 500 MG tablet Take 500 mg by mouth every 8 (eight) hours as needed. For muscle spasms      . metoprolol succinate (TOPROL-XL) 25 MG 24 hr tablet Take 12.5 mg by mouth daily.      . Multiple Vitamins-Minerals (PRESERVISION/LUTEIN) CAPS Take 1 capsule by mouth daily.       . pantoprazole (PROTONIX) 40 MG tablet Take 40 mg by mouth every morning.       .  potassium chloride SA (KLOR-CON M20) 20 MEQ tablet Take 20 mEq by mouth daily.       . predniSONE (DELTASONE) 5 MG tablet Take 5 mg by mouth every evening. 7 day course started 07/26/12      . senna-docusate (SENOKOT-S) 8.6-50 MG per tablet Take 1 tablet by mouth 2 (two) times daily.      . vitamin C (ASCORBIC ACID) 500 MG tablet Take 500 mg by mouth daily.      Marland Kitchen warfarin (COUMADIN) 4 MG tablet Take 4 mg by mouth every evening.      Marland Kitchen ULTIMA TEST test strip USE ONE THREE TIMES DAILY  300 each  PRN  . VALUE PLUS LANCETS THIN 26G MISC Use as directed.  200 each  11   Scheduled:    Marland Kitchen Aclidinium Bromide  1 puff Inhalation BID  . amiodarone  200 mg Oral Daily  . [COMPLETED] aspirin  324 mg Oral Once  . aspirin  81 mg Oral Daily  . budesonide-formoterol  2 puff Inhalation BID  . [COMPLETED] furosemide  20 mg Intravenous Once  . furosemide  80 mg Oral BID  . insulin aspart  0-5 Units Subcutaneous QHS  . insulin aspart  0-9 Units Subcutaneous TID WC  . Ipratropium-Albuterol  1 puff Inhalation QID  . isosorbide mononitrate  60 mg Oral Daily  . LORazepam  0.5 mg Oral QHS  . metoprolol succinate  12.5 mg Oral Daily  . [COMPLETED] nitroGLYCERIN  1 inch Topical Once  . pantoprazole  40 mg Oral QAC breakfast  . potassium chloride SA  20 mEq Oral Daily  . predniSONE  5 mg Oral QPM  . senna-docusate  1 tablet Oral BID  . sodium chloride  3 mL Intravenous Q12H  . sodium chloride  3 mL Intravenous Q12H  . warfarin  4 mg Oral q1800  . Warfarin - Pharmacist Dosing Inpatient   Does not apply q1800  . [DISCONTINUED] senna  1 tablet Oral BID    Assessment: 79yo female c/o edema and wt gain, admitted for CHF exacerbation, to continue Coumadin for Afib; admitted with therapeutic INR.  Goal of Therapy:  INR 2-3   Plan:  Will continue home Coumadin dose of 4mg  daily and monitor INR.  Colleen Can PharmD BCPS 07/30/2012,1:46 AM

## 2012-07-30 NOTE — Consult Note (Signed)
.  Pt. Seen and examined. Agree with the NP/PA-C note as written.  79 yo female with worsening leg edema and dyspnea concerning for probable diastolic heart failure. Echo on 07/21/12 shows EF 55-60%, this was performed for TIA.  She also has a history of atrial fibrillation. She is responding to diuretics. I would continue diuresis with a goal dry weight <160 lbs.  Chrystie Nose, MD, [facility] Attending Cardiologist [facility]

## 2012-07-30 NOTE — Progress Notes (Signed)
Patient's heart rate=42.  She was sleeping, I woke patient to assess her, she was asymptomatic I then notified Dr. Lavera Bush, no new orders received.

## 2012-07-30 NOTE — Evaluation (Signed)
Physical Therapy Evaluation Patient Details Name: [name] MRN: [id number] DOB: [birth year] Today's Date: 07/30/2012 Time: 1212-1232 PT Time Calculation (min): 20 min  PT Assessment / Plan / Recommendation Clinical Impression  pt admitted with SOB due to CHF. Has been on high dose Lasix and has lost some fluid weight.  Breathing is better, but pt can benefit from PT to improve balance, activity tolerance and max. indep.    PT Assessment  Patient needs continued PT services    Follow Up Recommendations  Home health PT;Supervision - Intermittent (if pt does not reach indep. then back to [place])    Does the patient have the potential to tolerate intense rehabilitation      Barriers to Discharge Decreased caregiver support      Equipment Recommendations  None recommended by PT    Recommendations for Other Services     Frequency Min 3X/week    Precautions / Restrictions Precautions Precautions: Fall   Pertinent Vitals/Pain       Mobility  Bed Mobility Bed Mobility: Not assessed Transfers Transfers: Sit to Stand;Stand to Sit Sit to Stand: 5: Supervision;From chair/3-in-1;With upper extremity assist Stand to Sit: 5: Supervision;With upper extremity assist;To chair/3-in-1 Details for Transfer Assistance: cues for safety with RW and abandoning RW throughout session Ambulation/Gait Ambulation/Gait Assistance: 5: Supervision Ambulation Distance (Feet): 150 Feet Assistive device: Rolling walker Ambulation/Gait Assistance Details: generally safe use of the RW and mildly unsteadythroughout Gait Pattern: Step-through pattern;Decreased stride length Stairs: No    Exercises     PT Diagnosis: Other (comment) (decr activity tolerance)  PT Problem List: Decreased activity tolerance;Decreased balance;Decreased safety awareness;Decreased knowledge of use of DME PT Treatment Interventions: Gait training;Stair training;Functional mobility training;Therapeutic activities;Balance  training;Patient/family education   PT Goals Acute Rehab PT Goals PT Goal Formulation: With patient Time For Goal Achievement: 07/28/12 Potential to Achieve Goals: Good Pt will go Supine/Side to Sit: Independently;with HOB 0 degrees PT Goal: Supine/Side to Sit - Progress: Goal set today Pt will go Sit to Stand: with modified independence PT Goal: Sit to Stand - Progress: Goal set today Pt will go Stand to Sit: with modified independence PT Goal: Stand to Sit - Progress: Goal set today Pt will Ambulate: >150 feet;with modified independence;with least restrictive assistive device PT Goal: Ambulate - Progress: Goal set today Additional Goals Additional Goal #1: Pt will complete DGI with score of 20 or greater PT Goal: Additional Goal #1 - Progress: Goal set today  Visit Information  Last PT Received On: 07/30/12 Assistance Needed: +1 PT/OT Co-Evaluation/Treatment: Yes    Subjective Data  Patient Stated Goal: return home   Prior Functioning  Home Living Lives With: Alone Available Help at Discharge: Family;Available PRN/intermittently Type of Home: Apartment Home Access: Level entry Home Layout: One level Bathroom Shower/Tub: Engineer, manufacturing systems: Standard Home Adaptive Equipment: Dan Humphreys - four wheeled;Straight cane;Shower chair with back Additional Comments: Pt currently from [facility] after recent hospitalization Prior Function Level of Independence: Independent with assistive device(s);Needs assistance Needs Assistance: Light Housekeeping Light Housekeeping: Moderate Able to Take Stairs?: No Driving: No Vocation: Retired Comments: Pt reports wearing oxgyen intermittent throughout the day at [place] place and all the time at night. Pt uses rollator at [employer] place Communication Communication: HOH Dominant Hand: Right    Cognition  Cognition Overall Cognitive Status: Impaired Area of Impairment: Safety/judgement;Awareness of  deficits Arousal/Alertness: Awake/alert Orientation Level: Appears intact for tasks assessed Behavior During Session: [facility] for tasks performed Safety/Judgement: Impulsive    Extremity/Trunk Assessment Right  Upper Extremity Assessment RUE ROM/Strength/Tone: Within functional levels RUE Sensation: WFL - Light Touch RUE Coordination: WFL - gross/fine motor Left Upper Extremity Assessment LUE ROM/Strength/Tone: Within functional levels LUE Sensation: WFL - Light Touch LUE Coordination: WFL - gross/fine motor Right Lower Extremity Assessment RLE ROM/Strength/Tone: WFL for tasks assessed Left Lower Extremity Assessment LLE ROM/Strength/Tone: WFL for tasks assessed Trunk Assessment Trunk Assessment: Normal   Balance Balance Balance Assessed: No  End of Session PT - End of Session Activity Tolerance: Patient tolerated treatment well Patient left: in chair;with call bell/phone within reach;with family/visitor present;with nursing in room Nurse Communication: Mobility status  GP     Excell Neyland, Eliseo Gum 07/30/2012, 4:46 PM  07/30/2012  Wales Bing, PT [phone] (pager)

## 2012-07-31 DIAGNOSIS — I2510 Atherosclerotic heart disease of native coronary artery without angina pectoris: Secondary | ICD-10-CM

## 2012-07-31 LAB — BASIC METABOLIC PANEL
BUN: 39 mg/dL — ABNORMAL HIGH (ref 6–23)
Chloride: 100 mEq/L (ref 96–112)
Creatinine, Ser: 1.47 mg/dL — ABNORMAL HIGH (ref 0.50–1.10)
GFR calc Af Amer: 37 mL/min — ABNORMAL LOW (ref >90)
GFR calc non Af Amer: 32 mL/min — ABNORMAL LOW (ref >90)

## 2012-07-31 LAB — GLUCOSE, CAPILLARY: Glucose-Capillary: 131 mg/dL — ABNORMAL HIGH (ref 70–99)

## 2012-07-31 LAB — PROTIME-INR
INR: 3.02 — ABNORMAL HIGH (ref 0.00–1.49)
Prothrombin Time: 29.7 seconds — ABNORMAL HIGH (ref 11.6–15.2)

## 2012-07-31 MED ORDER — LORAZEPAM 0.5 MG PO TABS: 0.5000 mg | ORAL_TABLET | Freq: Every day | ORAL | Status: DC

## 2012-07-31 MED ORDER — AMIODARONE HCL 100 MG PO TABS: 100.0000 mg | ORAL_TABLET | Freq: Every day | ORAL | Status: DC

## 2012-07-31 MED ORDER — AMIODARONE HCL 100 MG PO TABS
100.0000 mg | ORAL_TABLET | Freq: Every day | ORAL | Status: DC
  Administered 2012-07-31: 100 mg via ORAL
  Filled 2012-07-31: qty 1

## 2012-07-31 MED ORDER — INSULIN DETEMIR 100 UNIT/ML ~~LOC~~ SOLN: 30.0000 [IU] | Freq: Every day | SUBCUTANEOUS | Status: DC

## 2012-07-31 MED ORDER — FUROSEMIDE 40 MG PO TABS: 40.0000 mg | ORAL_TABLET | Freq: Every day | ORAL | Status: DC

## 2012-07-31 NOTE — Progress Notes (Signed)
Patient for d/c today to SNF bed at [facility] as pta. Family and patient agreeable to this plan- plan transfer via family. Reece Levy, MSW, Theresia Majors [phone]

## 2012-07-31 NOTE — Progress Notes (Signed)
Subjective: No complaints, during sleep HR down to 42.  Objective: Vital signs in last 24 hours: Temp:  [98 F (36.7 C)-98.4 F (36.9 C)] 98.1 F (36.7 C) (02/04 0515) Pulse Rate:  [53-61] 57  (02/04 0515) Resp:  [18] 18  (02/04 0515) BP: (103-156)/(49-81) 156/81 mmHg (02/04 0515) SpO2:  [91 %-97 %] 96 % (02/04 0515) Weight:  [73.211 kg (161 lb 6.4 oz)] 73.211 kg (161 lb 6.4 oz) (02/04 0515) Weight change: -1.814 kg (-4 lb) Last BM Date: 07/30/12 Intake/Output from previous day: -20  Wt 73.211 down from 75 kg.  02/03 0701 - 02/04 0700 In: 1080 [P.O.:1080] Out: 1100 [Urine:1100] Intake/Output this shift:    PE:  General:alert and oriented feels better, willing to go back to re-hab at discharge Heart:S1S2 RRR Lungs:clear without rales or rhonchi Abd:+ BS, soft, non tender Ext:tr edema Rt. Ankle and none in lt.   Lab Results:  Basename 07/30/12 0600 07/29/12 2101 07/29/12 2002  WBC 6.7 -- 7.4  HGB 10.2* 10.2* --  HCT 32.4* 30.0* --  PLT 291 -- 295   BMET  Basename 07/30/12 0600 07/29/12 2101  NA 143 144  K 3.6 4.0  CL 106 110  CO2 25 --  GLUCOSE 71 75  BUN 40* 42*  CREATININE 1.48* 1.80*  CALCIUM 9.5 --    Basename 07/30/12 1145 07/30/12 0602  TROPONINI <0.30 <0.30    Lab Results  Component Value Date   CHOL 190 07/23/2012   HDL 64 07/23/2012   LDLCALC 107* 07/23/2012   TRIG 93 07/23/2012   CHOLHDL 3.0 07/23/2012   Lab Results  Component Value Date   HGBA1C 8.3* 07/21/2012     Lab Results  Component Value Date   TSH 2.319 07/30/2012     Studies/Results: Dg Chest 2 View  07/30/2012  *RADIOLOGY REPORT*  Clinical Data: Shortness of breath.  CHEST - 2 VIEW  Comparison: 07/29/2012 and 06/04/2010  Findings: There is chronic cardiomegaly.  There is slight pulmonary vascular prominence with tiny bilateral pleural effusions.  No infiltrates.  Chronic prominence of the inferior aspect of the right hilum, unchanged since 06/04/2010.  Chronic accentuation of the  thoracic kyphosis.  No acute osseous abnormality.  IMPRESSION: Chronic cardiomegaly with mild pulmonary vascular prominence and a tiny bilateral pleural effusions.  This could represent slight congestive heart failure.   Original Report Authenticated By: Francene Boyers, M.D.    Dg Chest Port 1 View  07/29/2012  *RADIOLOGY REPORT*  Clinical Data: Chest pain. Shortness of breath.  Diabetic hypertensive nonsmoker.  History of congestive heart failure.  PORTABLE CHEST - 1 VIEW  Comparison: 08/24/2011.  Findings: Cardiomegaly.  Pulmonary vascular congestion/mild pulmonary edema.  Calcified tortuous aorta.  No segmental consolidation.  No gross pneumothorax.  IMPRESSION: Cardiomegaly.  Pulmonary vascular congestion/mild pulmonary edema.  Calcified tortuous aorta.   Original Report Authenticated By: Lacy Duverney, M.D.     Medications: I have reviewed the patient's current medications.    Marland Kitchen amiodarone  200 mg Oral Daily  . aspirin  81 mg Oral Daily  . budesonide-formoterol  2 puff Inhalation BID  . furosemide  80 mg Oral BID  . insulin aspart  0-5 Units Subcutaneous QHS  . insulin aspart  0-9 Units Subcutaneous TID WC  . insulin detemir  20 Units Subcutaneous QHS  . isosorbide mononitrate  60 mg Oral Daily  . LORazepam  0.5 mg Oral QHS  . metoprolol succinate  12.5 mg Oral Daily  . pantoprazole  40 mg Oral  QAC breakfast  . potassium chloride SA  20 mEq Oral Daily  . predniSONE  5 mg Oral QPM  . senna-docusate  1 tablet Oral BID  . sodium chloride  3 mL Intravenous Q12H  . sodium chloride  3 mL Intravenous Q12H  . tiotropium  18 mcg Inhalation Daily  . warfarin  4 mg Oral q1800  . Warfarin - Pharmacist Dosing Inpatient   Does not apply q1800   Assessment/Plan: Principal Problem:  *Acute on chronic diastolic congestive heart failure Active Problems:  HYPERLIPIDEMIA, statin intol  PAF, recurrance this admission, on chronic Amio/ Coumadin  COPD (chronic obstructive pulmonary disease)  Chronic  respiratory failure with hypoxia  Gait instability  HTN (hypertension)  RBBB  CAD, remote RCA PCI, subsequently noted to be occluded, last cath 8/11  PVD (peripheral vascular disease), moderate carotid and LSCA disease  Chronic anticoagulation, on coumadin  Ataxia  Falls frequently  Sacral fracture, closed  DM type 2 causing CKD stage 3  PLAN: I&O do not seem to reflect her 4 pound wt. Loss.   Currently on 80 mg BID of Lasix, ? D/c on 80 po vs. 40 po daily, was on 20 mg previously. Toprol XL at 12.5 mg daily with HR down to 42 at hs.  Also on amiodarone.  Will decrease amiodarone to 100 daily Pro BNP 816 today more of her baseline.  Will recheck BMP.   INR 3.02  LOS: 2 days   INGOLD,LAURA R 07/31/2012, 8:12 AM   Agree with note written by Nada Boozer RNP. Admitted with CHF secondary to diastaltic dysfunction. Nl LV fxn. Diuresed. Feels better. Back to baseline. Mild peripheral edema. OK to D/C home this AM. Increase PO lasix from 20 to 40 mg/daily. ROV with PA 1 week and with me on 3/3. BNP back to her baseline. Scr stable.   Admitted Talbert Trembath J 07/31/2012 9:30 AM

## 2012-07-31 NOTE — Progress Notes (Signed)
Called report to [place] at [place] [facility]. Reviewed all discharge instructions, last vital signs, labs, procedures/tests while hospitalized and medications given. Copy of discharge instructions and prescriptions in packet sent to [facility]. Margarito Courser to please contact me if she needs any additional information or has any other questions.

## 2012-07-31 NOTE — Progress Notes (Signed)
Reviewed all discharge instructions with patient including diet, activity, medications, appointments and all other instructions. IV removed. Patient verbalized understanding. Copy of instructions and prescriptions sent with patient in packet for [facility] place.

## 2012-07-31 NOTE — Discharge Summary (Signed)
Physician Discharge Summary  [name] ZOX:[id number] DOB: 03-04-1928 DOA: 07/29/2012  PCP: Crawford Givens, MD  Admit date: 07/29/2012 Discharge date: 07/31/2012  Time spent: 45 minutes  Recommendations for Outpatient Follow-up:  Monitor daily weight - if patient gains 3 pounds she is OK to have an extra dose of lasix   Discharge Diagnoses:  Acute on chronic diastolic congestive heart failure:  PAF, recurrance this admission, on chronic Amio/ Coumadin  COPD (chronic obstructive pulmonary disease)  Chronic respiratory failure with hypoxia  Gait instability  HTN (hypertension)  RBBB  CAD, remote RCA PCI, subsequently noted to be occluded, last cath 8/11  PVD (peripheral vascular disease), moderate carotid and LSCA disease  Chronic anticoagulation, on coumadin  Ataxia  Falls frequently  Sacral fracture, closed  DM type 2 causing CKD stage 3   Discharge Condition: good   Diet recommendation: heart healthy   Filed Weights   07/29/12 2347 07/31/12 0515  Weight: 75.025 kg (165 lb 6.4 oz) 73.211 kg (161 lb 6.4 oz)    History of present illness:   [name] is a 79 y.o. female with multiple medical problems who was brought to the ED due to progressive shortness of breath. Patient was discharged to a skilled nursing facility on 07/23/2012. She complains off progressive worsening of leg edema associated with worsening shortness of breath since her admission to the skilled nursing facility. Patient had one episode of chest pain which lasted for one to 2 minutes was present in the middle of the chest, not associated with exertion, disappeared by itself, no exacerbating or relieving factors noted, no recurrence since that one episode. Skilled nursing facility was not comfortable to increase the dose of Lasix as patient has chronic renal insufficiency with a baseline creatinine of 1.8 and decided to bring the patient to ER. Patient also complains that he has gained about 3 pounds since her  discharge weight which was 164 pounds but today she weighed about 167 pounds in the skilled nursing facility which is 3 pounds above her normal weight. Patient's renal function is at baseline and her INR is therapeutic at this time. ER physician wants to admit the patient to the medicine team for mild CHF exacerbation and possible cardiology consult as per the patient's daughter's request.      Hospital Course:  1. Acute on chronic diastolic CHF - patient was placed on increase furosemide dose on admission- Cardiology consult was called and Dr. Allyson Sabal recommended 40 mg daily of furosemide at DC  2. HL - continued on Lipitor  3. HTN  4. Anxiety 5. Ataxia, falls - patient to return to snf at DC 6. DM type 2 insulin requiring - with hypoglycemia. Normally on levemir 50 units q am . Resumed levemir at 20 units QHS on 07/30/12 . Would recommend decreasing the dose to 30 units daily at DC.  7. COPD - patient to continue [place] and Symbicort at DC 8. Bradycardia - asymptomatic. We decreased amiodarone dose to 100 mg daily  9. CKD stage 3 - stable  10. afib on chronic coumadin  11. Bilat carotid artery stenoses - plans were made for outpatient f/u with [facility] during previous admission      Procedures: none Consultations:  sehvc  Discharge Exam: Filed Vitals:   07/30/12 1730 07/30/12 1958 07/30/12 2100 07/31/12 0515  BP: 103/63  131/49 156/81  Pulse: 56 61 56 57  Temp:   98.4 F (36.9 C) 98.1 F (36.7 C)  TempSrc:  Resp:  18 18 18   Height:      Weight:    73.211 kg (161 lb 6.4 oz)  SpO2:  91% 91% 96%    General: axox3 Cardiovascular: reg brady Respiratory: ctab  Discharge Instructions      Discharge Orders    Future Appointments: Provider: Department: Dept Phone: Center:   08/07/2012 9:30 AM Pryor Ochoa, MD [facility] [phone] [facility]   08/16/2012 1:45 PM [name], MD [facility] [phone] None   03/04/2013 2:30 PM Sherrie George, MD [facility] [phone] None     Future Orders Please Complete By Expires   Diet - low sodium heart healthy      Increase activity slowly          Medication List     As of 07/31/2012  9:58 AM    STOP taking these medications         COMBIVENT RESPIMAT 20-100 MCG/ACT Aers respimat   Generic drug: Ipratropium-Albuterol      doxycycline 100 MG tablet   Commonly known as: VIBRA-TABS      ULTIMA TEST test strip   Generic drug: glucose blood      VALUE PLUS LANCETS THIN 26G Misc      TAKE these medications         ALPRAZolam 0.25 MG tablet   Commonly known as: XANAX   Take 0.25 mg by mouth 2 (two) times daily as needed. for anxiety      amiodarone 100 MG tablet   Commonly known as: PACERONE   Take 1 tablet (100 mg total) by mouth daily.      aspirin 81 MG chewable tablet   Chew 81 mg by mouth daily.      atorvastatin 40 MG tablet   Commonly known as: LIPITOR   Take 40 mg by mouth every evening.      furosemide 40 MG tablet   Commonly known as: LASIX   Take 1 tablet (40 mg total) by mouth daily.      glucosamine-chondroitin 500-400 MG tablet   Take 1 tablet by mouth 2 (two) times daily.      HYDROcodone-acetaminophen 5-325 MG per tablet   Commonly known as: NORCO/VICODIN   Take 1 tablet by mouth every 6 (six) hours as needed. For pain      insulin detemir 100 UNIT/ML injection   Commonly known as: LEVEMIR   Inject 30 Units into the skin daily.      insulin lispro 100 UNIT/ML injection   Commonly known as: HUMALOG   Inject 5 Units into the skin 3 (three) times daily as needed. If CBG over 200      isosorbide mononitrate 60 MG 24 hr tablet   Commonly known as: IMDUR   Take 60 mg by mouth daily.      KLOR-CON M20 20 MEQ tablet   Generic drug: potassium chloride SA   Take 20 mEq by mouth daily.      LORazepam 0.5 MG tablet   Commonly known as: ATIVAN   Take 1 tablet (0.5 mg total) by mouth at bedtime.       methocarbamol 500 MG tablet   Commonly known as: ROBAXIN   Take 500 mg by mouth every 8 (eight) hours as needed. For muscle spasms      metoprolol succinate 25 MG 24 hr tablet   Commonly known as: TOPROL-XL   Take 12.5 mg by mouth daily.  predniSONE 5 MG tablet   Commonly known as: DELTASONE   Take 5 mg by mouth every evening. 7 day course started 07/26/12      PreserVision/Lutein Caps   Take 1 capsule by mouth daily.      PROTONIX 40 MG tablet   Generic drug: pantoprazole   Take 40 mg by mouth every morning.      senna-docusate 8.6-50 MG per tablet   Commonly known as: Senokot-S   Take 1 tablet by mouth 2 (two) times daily.      SYMBICORT 160-4.5 MCG/ACT inhaler   Generic drug: budesonide-formoterol   Inhale 2 puffs into the lungs 2 (two) times daily.      TUDORZA PRESSAIR 400 MCG/ACT Aepb   Generic drug: Aclidinium Bromide   Inhale 1 puff into the lungs 2 (two) times daily. Formulary sub for Spiriva per MAR      vitamin C 500 MG tablet   Commonly known as: ASCORBIC ACID   Take 500 mg by mouth daily.      warfarin 4 MG tablet   Commonly known as: COUMADIN   Take 4 mg by mouth every evening.        Follow-up Information    Follow up with Runell Gess, MD. On 09/07/2012. (as previously scheduled)    Contact information:   [address] [place] [address] [phone]       Follow up with [facility] R, NP. (the office will call date and time)    Contact information:   [address] [place] [address] [phone]       Schedule an appointment as soon as possible for a visit with EARLY, TODD, MD. (vascular surgery evaluation)    Contact information:   [address] [place] [address] [phone]           The results of significant diagnostics from this hospitalization (including imaging, microbiology, ancillary and laboratory) are listed below for reference.    Significant Diagnostic Studies: Dg Chest 2  View  07/30/2012  *RADIOLOGY REPORT*  Clinical Data: Shortness of breath.  CHEST - 2 VIEW  Comparison: 07/29/2012 and 06/04/2010  Findings: There is chronic cardiomegaly.  There is slight pulmonary vascular prominence with tiny bilateral pleural effusions.  No infiltrates.  Chronic prominence of the inferior aspect of the right hilum, unchanged since 06/04/2010.  Chronic accentuation of the thoracic kyphosis.  No acute osseous abnormality.  IMPRESSION: Chronic cardiomegaly with mild pulmonary vascular prominence and a tiny bilateral pleural effusions.  This could represent slight congestive heart failure.   Original Report Authenticated By: Francene Boyers, M.D.    Dg Sacrum/coccyx  07/20/2012  *RADIOLOGY REPORT*  Clinical Data: Fall.  Low back pain.  SACRUM AND COCCYX - 2+ VIEW  Comparison: 07/10/2011  Findings: Prominent bony demineralization noted.  The contours of the sacrum below the sacroiliac joints are not well seen, with the cortex nearly completely obscured due to the bony demineralization. Accordingly, sensitivity for fracture is low.  Lower lumbar spondylosis noted.  No discrete fracture observed on the lateral projection.  There is elimination of the intervertebral disc space at L5-S1, along with vacuum disc phenomenon at L3-4 and L4-5, posterior osseous ridging at L3-4 and L4-5, and stable grade 1 anterolisthesis at L4-5.  Dense aortoiliac atherosclerotic calcification noted.  IMPRESSION:  1.  Lumbar spondylosis and degenerative disc disease. 2.  No definite sacral fracture.  Sensitivity is reduced due to bony demineralization. Followup nuclear medicine bone scan or MRI  could be used for greater sensitivity. 3.  Aortoiliac atherosclerosis.   Original Report Authenticated By: Gaylyn Rong, M.D.    Dg Hip Bilateral W/pelvis  07/20/2012  *RADIOLOGY REPORT*  Clinical Data: Fall  BILATERAL HIP WITH PELVIS - 4+ VIEW  Comparison: None.  Findings: Five views bilateral hip submitted.  No acute fracture  or subluxation.  Mild degenerative changes bilateral hip joints with narrowing superior joint space.  IMPRESSION: No acute fracture or subluxation.  Mild degenerative changes bilateral hip joints.   Original Report Authenticated By: Natasha Mead, M.D.    Ct Head Wo Contrast  07/20/2012  *RADIOLOGY REPORT*  Clinical Data: Recurrent falls  CT HEAD WITHOUT CONTRAST  Technique:  Contiguous axial images were obtained from the base of the skull through the vertex without contrast.  Comparison: 07/10/2011  Findings: No skull fracture is noted.  Paranasal sinuses and mastoid air cells are unremarkable.  No intracranial hemorrhage, mass effect or midline shift.  Atherosclerotic calcifications of carotid siphon.  No acute infarction.  No mass lesion is noted on this unenhanced scan.  Mild cerebral atrophy.  IMPRESSION: No acute intracranial abnormality.  Mild cerebral atrophy.   Original Report Authenticated By: Natasha Mead, M.D.    Mri Brain Without Contrast  07/21/2012  *RADIOLOGY REPORT*  Clinical Data:  79 year old female with dizziness, nausea, vomiting, falls.  Comparison: Head CTs without contrast 07/20/2012.  Cervical MRI 01/26 of seven.  MRI HEAD WITHOUT CONTRAST  Technique: Multiplanar, multiecho pulse sequences of the brain and surrounding structures were obtained according to standard protocol without intravenous contrast.  Findings: No restricted diffusion to suggest acute infarction.  No midline shift, mass effect, evidence of mass lesion, ventriculomegaly, extra-axial collection or acute intracranial hemorrhage.  Cervicomedullary junction and pituitary are within normal limits.  Major intracranial vascular flow voids are preserved; dominant distal right vertebral artery.  Mild for age scattered cerebral white matter T2 and FLAIR hyperintensity.  No cortical encephalomalacia.  Occasional chronic micro hemorrhages (left occipital lobe, left cerebellar hemisphere).  Deep gray matter nuclei and brain stem are  within normal limits.  Negative for age visualized cervical spine.  Normal bone marrow signal.  Postoperative changes to the globes. Visualized paranasal sinuses and mastoids are clear.  Grossly negative visualized internal auditory structures.  Negative scalp soft tissues.  IMPRESSION: 1. No acute intracranial abnormality. 2.  Mild for age nonspecific signal changes, most commonly due to chronic small vessel disease. 3.  See MRA findings below.  MRA HEAD WITHOUT CONTRAST  Technique: Angiographic images of the Circle of Willis were obtained using MRA technique without  intravenous contrast.  Findings: Antegrade flow in the posterior circulation with dominant distal right vertebral artery.  The left vertebral artery functionally terminates in PICA.  Normal right PICA.  Patent vertebrobasilar junction.  Mild basilar artery irregularity and tortuosity without stenosis.  Fetal right PCA origin.  SCA and left PCA origin are within normal limits.  Both proximal PCAs are tortuous.  No PCA stenosis.  The left posterior communicating artery is diminutive.  Antegrade flow in both ICA siphons.  No ICA stenosis.  Ophthalmic and posterior communicating artery origins are within normal limits.  Patent carotid termini.  MCA and ACA origins are within normal limits.  Mild irregularity and stenosis of the left ACA A1 segment. Diminutive anterior communicating artery.  Other visualized ACA branches are within normal limits.  Mild right MCA M1 segment irregularity.  Visualized right MCA branches otherwise are within normal limits.  Left MCA M1 segment  is normal to the bifurcation.  At the bifurcation there is a high-grade stenosis of the origin of the posterior sylvian division (series 605 image 4).  There is mild ectasia just beyond the stenosis without discrete aneurysm.  The downstream left M2 branches remain patent despite this finding. Other visualized left MCA branches are within normal limits.  IMPRESSION: 1.  High-grade  stenosis at the left MCA bifurcation affecting the origin of the posterior M2 branch.  However, distal flow is preserved. 2.  Mild anterior circulation atherosclerosis otherwise. 3.  Mild posterior circulation tortuosity without stenosis.   Original Report Authenticated By: Erskine Speed, M.D.    Mr Lumbar Spine Wo Contrast  07/21/2012  *RADIOLOGY REPORT*  Clinical Data: 79 year old female with falls.  Dizziness, nausea and vomiting.  Bilateral lower extremity weakness.  MRI LUMBAR SPINE WITHOUT CONTRAST  Technique:  Multiplanar and multiecho pulse sequences of the lumbar spine were obtained without intravenous contrast.  Comparison: CT abdomen and pelvis 07/10/2011.  Findings: MRI sacrum performed at the same time is reported separately.  Hypoplastic ribs at T12 depicted on the comparison.  Chronic mild grade 1 anterolisthesis of L4 on L5.  Stable vertebral height and alignment since 2013.  Chronic T12 inferior endplate Schmorl's node.  Mild associated marrow edema. No acute osseous abnormality identified.   Visualized lower thoracic spinal cord is normal with conus medularis at L1-L2.  See below regarding levels of mild lower thoracic and upper lumbar spinal stenosis.  Negative paraspinal soft tissues except for subcutaneous edema. Stable renal atrophy.  Stable visualized abdominal viscera.  T8-T9:  Negative.  T9-T10:  Mild disc bulge.  Mild to moderate posterior element hypertrophy.  No significant spinal stenosis.  Mild right greater than left T9 foraminal stenosis.  T10-T11:  Mild to moderate circumferential disc osteophyte complex. Mild to moderate posterior element hypertrophy.  No significant spinal stenosis.  Mild left and moderate right T10 foraminal stenosis.  T11-T12:  Mild circumferential disc bulge.  No significant stenosis.  T12-L1:  Left eccentric circumferential disc bulge.  Mild to moderate facet and ligament flavum hypertrophy greater on the left. Mild left lateral recess stenosis.  Moderate left  T12 foraminal stenosis.  No significant spinal stenosis.  L1-L2:  Mild circumferential disc bulge.  Small left paracentral disc protrusion superimposed.  Mild bilateral L1 foraminal stenosis.  No significant spinal or lateral recess stenosis.  L2-L3:  Mild circumferential disc bulge.  Mild facet and ligament flavum hypertrophy.  Mild bilateral L2 foraminal stenosis. Borderline spinal stenosis.  L3-L4:  Mild circumferential disc bulge.  Moderate facet and ligament flavum hypertrophy.  Mild spinal and right greater than left lateral recess stenosis.  Mild left and moderate right L3 foraminal stenosis.  L4-L5:  Grade 1 anterolisthesis.  Right eccentric circumferential disc osteophyte complex.  Severe facet hypertrophy.  Moderate to severe ligament flavum hypertrophy.  Posteriorly situated synovial cyst which will not cause neural compromise.  Mild spinal stenosis. Moderate right and mild left lateral recess stenosis.  Moderate right and mild left L4 foraminal stenosis.  L5-S1:  Vestigial disc.  Mild facet hypertrophy.  No significant stenosis.  IMPRESSION: 1.  Widespread lower thoracic and lumbar disc chronic degeneration. Acute-on-chronic T12 degenerative endplate changes. 2.  Chronic grade 1 anterolisthesis at L4-L5 associated with advanced disc and facet degeneration resulting in mild spinal stenosis moderate right and mild left lateral recess and L4 foraminal stenosis. 3.  No other significant spinal stenosis.  Additional multilevel mild to moderate neural foraminal stenosis as detailed  above.   Original Report Authenticated By: Erskine Speed, M.D.    Mr Sacrum/si Joints Wo Contrast  07/21/2012  *RADIOLOGY REPORT*  Clinical Data:  Dizziness and falls.  Lower extremity weakness.  MRI SACRUM WITHOUT CONTRAST  Technique:  Multiplanar and multiecho pulse sequences of the sacrum to include the sacroiliac joints and the coccyx were obtained without intravenous contrast.  Comparison:  Radiographs from 07/20/2012; prior  CT from 07/10/2011; prior MRI pelvis from 09/29/2007  Findings:  Transverse nondisplaced fracture at the sacrococcygeal junction noted with adjacent marrow edema and low-level adjacent soft tissue edema as shown on image 11 of series 10 and image 6 of series 12.  Mild findings of degenerative sacroiliitis noted.  Sigmoid diverticulosis noted without active diverticulitis.  The uterus is absent.  No sciatic notch impingement or obturator impingement. Lumbar spine findings are available under accession number [id number].  IMPRESSION:  1.  Nondisplaced transverse fracture at the sacrococcygeal junction appears acute. 2.  Sigmoid diverticulosis. 3.  Several small focal T2 hyperintense lesions along the sacroiliac joints, favoring mild chronic sacroiliitis.   Original Report Authenticated By: Gaylyn Rong, M.D.    Dg Chest Port 1 View  07/29/2012  *RADIOLOGY REPORT*  Clinical Data: Chest pain. Shortness of breath.  Diabetic hypertensive nonsmoker.  History of congestive heart failure.  PORTABLE CHEST - 1 VIEW  Comparison: 08/24/2011.  Findings: Cardiomegaly.  Pulmonary vascular congestion/mild pulmonary edema.  Calcified tortuous aorta.  No segmental consolidation.  No gross pneumothorax.  IMPRESSION: Cardiomegaly.  Pulmonary vascular congestion/mild pulmonary edema.  Calcified tortuous aorta.   Original Report Authenticated By: Lacy Duverney, M.D.    Mr Mra Head/brain Wo Cm  07/21/2012  *RADIOLOGY REPORT*  Clinical Data:  79 year old female with dizziness, nausea, vomiting, falls.  Comparison: Head CTs without contrast 07/20/2012.  Cervical MRI 01/26 of seven.  MRI HEAD WITHOUT CONTRAST  Technique: Multiplanar, multiecho pulse sequences of the brain and surrounding structures were obtained according to standard protocol without intravenous contrast.  Findings: No restricted diffusion to suggest acute infarction.  No midline shift, mass effect, evidence of mass lesion, ventriculomegaly, extra-axial collection or  acute intracranial hemorrhage.  Cervicomedullary junction and pituitary are within normal limits.  Major intracranial vascular flow voids are preserved; dominant distal right vertebral artery.  Mild for age scattered cerebral white matter T2 and FLAIR hyperintensity.  No cortical encephalomalacia.  Occasional chronic micro hemorrhages (left occipital lobe, left cerebellar hemisphere).  Deep gray matter nuclei and brain stem are within normal limits.  Negative for age visualized cervical spine.  Normal bone marrow signal.  Postoperative changes to the globes. Visualized paranasal sinuses and mastoids are clear.  Grossly negative visualized internal auditory structures.  Negative scalp soft tissues.  IMPRESSION: 1. No acute intracranial abnormality. 2.  Mild for age nonspecific signal changes, most commonly due to chronic small vessel disease. 3.  See MRA findings below.  MRA HEAD WITHOUT CONTRAST  Technique: Angiographic images of the Circle of Willis were obtained using MRA technique without  intravenous contrast.  Findings: Antegrade flow in the posterior circulation with dominant distal right vertebral artery.  The left vertebral artery functionally terminates in PICA.  Normal right PICA.  Patent vertebrobasilar junction.  Mild basilar artery irregularity and tortuosity without stenosis.  Fetal right PCA origin.  SCA and left PCA origin are within normal limits.  Both proximal PCAs are tortuous.  No PCA stenosis.  The left posterior communicating artery is diminutive.  Antegrade flow in both ICA siphons.  No ICA  stenosis.  Ophthalmic and posterior communicating artery origins are within normal limits.  Patent carotid termini.  MCA and ACA origins are within normal limits.  Mild irregularity and stenosis of the left ACA A1 segment. Diminutive anterior communicating artery.  Other visualized ACA branches are within normal limits.  Mild right MCA M1 segment irregularity.  Visualized right MCA branches otherwise are  within normal limits.  Left MCA M1 segment is normal to the bifurcation.  At the bifurcation there is a high-grade stenosis of the origin of the posterior sylvian division (series 605 image 4).  There is mild ectasia just beyond the stenosis without discrete aneurysm.  The downstream left M2 branches remain patent despite this finding. Other visualized left MCA branches are within normal limits.  IMPRESSION: 1.  High-grade stenosis at the left MCA bifurcation affecting the origin of the posterior M2 branch.  However, distal flow is preserved. 2.  Mild anterior circulation atherosclerosis otherwise. 3.  Mild posterior circulation tortuosity without stenosis.   Original Report Authenticated By: Erskine Speed, M.D.     Microbiology: No results found for this or any previous visit (from the past 240 hour(s)).   Labs: Basic Metabolic Panel:  Lab 07/30/12 7829 07/29/12 2101  NA 143 144  K 3.6 4.0  CL 106 110  CO2 25 --  GLUCOSE 71 75  BUN 40* 42*  CREATININE 1.48* 1.80*  CALCIUM 9.5 --  MG -- --  PHOS -- --   Liver Function Tests: No results found for this basename: AST:5,ALT:5,ALKPHOS:5,BILITOT:5,PROT:5,ALBUMIN:5 in the last 168 hours No results found for this basename: LIPASE:5,AMYLASE:5 in the last 168 hours No results found for this basename: AMMONIA:5 in the last 168 hours CBC:  Lab 07/30/12 0600 07/29/12 2101 07/29/12 2002  WBC 6.7 -- 7.4  NEUTROABS -- -- 5.8  HGB 10.2* 10.2* 10.1*  HCT 32.4* 30.0* 31.9*  MCV 94.7 -- 93.5  PLT 291 -- 295   Cardiac Enzymes:  Lab 07/30/12 1145 07/30/12 0602 07/30/12 0026  CKTOTAL -- -- --  CKMB -- -- --  CKMBINDEX -- -- --  TROPONINI <0.30 <0.30 <0.30   BNP: BNP (last 3 results)  Basename 07/31/12 0512 07/30/12 0026 07/20/12 1643  PROBNP 816.0* 1319.0* 1039.0*   CBG:  Lab 07/31/12 0622 07/30/12 2023 07/30/12 1627 07/30/12 1135 07/30/12 0838  GLUCAP 123* 186* 186* 100* 140*       Signed:  Neola Worrall  [facility] 07/31/2012, 9:58 AM

## 2012-08-06 ENCOUNTER — Ambulatory Visit: Payer: Medicare Other | Admitting: Pulmonary Disease

## 2012-08-07 ENCOUNTER — Encounter: Payer: Medicare Other | Admitting: Vascular Surgery

## 2012-08-10 ENCOUNTER — Ambulatory Visit: Payer: Medicare Other | Admitting: Family Medicine

## 2012-08-15 ENCOUNTER — Other Ambulatory Visit: Payer: Self-pay | Admitting: Family Medicine

## 2012-08-16 ENCOUNTER — Ambulatory Visit (INDEPENDENT_AMBULATORY_CARE_PROVIDER_SITE_OTHER): Payer: Medicare Other | Admitting: Pulmonary Disease

## 2012-08-16 ENCOUNTER — Encounter: Payer: Self-pay | Admitting: Pulmonary Disease

## 2012-08-16 VITALS — BP 114/64 | HR 58 | Temp 97.1°F | Ht 61.0 in | Wt 162.2 lb

## 2012-08-16 DIAGNOSIS — J439 Emphysema, unspecified: Secondary | ICD-10-CM

## 2012-08-16 DIAGNOSIS — J9610 Chronic respiratory failure, unspecified whether with hypoxia or hypercapnia: Secondary | ICD-10-CM

## 2012-08-16 DIAGNOSIS — J9611 Chronic respiratory failure with hypoxia: Secondary | ICD-10-CM

## 2012-08-16 DIAGNOSIS — R490 Dysphonia: Secondary | ICD-10-CM

## 2012-08-16 DIAGNOSIS — J438 Other emphysema: Secondary | ICD-10-CM

## 2012-08-16 DIAGNOSIS — R0902 Hypoxemia: Secondary | ICD-10-CM

## 2012-08-16 NOTE — Patient Instructions (Signed)
Follow up in 6 months 

## 2012-08-16 NOTE — Assessment & Plan Note (Signed)
Stable on her current regimen. 

## 2012-08-16 NOTE — Assessment & Plan Note (Signed)
She is to use 2.5 liters oxygen with exertion and sleep.

## 2012-08-16 NOTE — Progress Notes (Signed)
Chief Complaint  Patient presents with  . Follow-up    Pt states her breathing has been better. pt was hospitalized 07/29/12-07/31/12. pt c/o raspy voice. denies any cough but is having PND at night. pt is still having ankle swelling.     CC: [name]  History of Present Illness: [name] is a 79 y.o. female former smoker with GOLD 4 COPD, and chronic respiratory failure with hypoxia.  She was in hospital for CHF exacerbation from 07/29/12 to 07/31/12.  She is doing better.  Her troubles started with sinus congestion and sore throat from a head cold.  She then developed shortness of breath.  She still has hoarseness, and has to clear her throat.  She was coughing up sputum, but now is not having sputum.  She gets globus sensation.  She is using tudorza, and symbicort.  She use 2.5 liters oxygen with exertion and nightly with sleep.  Tests: PFT 06/08/10>>FEV1 0.93(62%), FEV1% 60, TLC 2.84(69%), DLCO 54%, +BD Echo 08/23/11>>mild LVH, EF 55 to 60%, mild AS, mild MR, mod/severe LA dilation ONO with 2.5 liters 05/07/12>>Test time 3 hrs 47 min. Mean SpO2 99%, low SpO2 96%. Echo 07/21/12 >> EF 55 to 60%, grade 2 diastolic dysfx, mild/mod MR, mild LA dilation  Past Medical History  Diagnosis Date  . Hyperlipidemia   . Hypertension   . Anxiety   . GERD (gastroesophageal reflux disease)   . CAD (coronary artery disease)     Cath Aug. 2011 > medical therapy recommended  . Diastolic dysfunction     Grade II  . RBBB (right bundle branch block)     Chronic  . Paroxysmal atrial fibrillation   . Hypoxemic respiratory failure, chronic     uses 2.5 liters of oxygen with sleep  . COPD (chronic obstructive pulmonary disease)     GOLD 4 - PFT 06/08/10 FEV1 0.93 (62%), FEV1% 60, TLC 2.84 (69%0, DLCO 54%, +BD  . Secondary pulmonary hypertension   . Edema   . Angina   . Shortness of breath   . CHF (congestive heart failure)   . Diabetes mellitus     Type II - goal A1C is 8, to avoid  hypoglycemia  . Bradycardia 08/24/2011    Past Surgical History  Procedure Laterality Date  . Rotator cuff repair  07/2005    right  . Abdominal hysterectomy    . Cardiac stents       x2  . Cardiac catheterization    . Coronary angioplasty      Allergies  Allergen Reactions  . Atorvastatin Other (See Comments)     muscle aches  . Codeine Nausea And Vomiting  . Iohexol Other (See Comments)     Desc: unknown reaction; allergic to iodine and contrast   . Morphine Nausea And Vomiting  . Rosuvastatin Other (See Comments)    muscle aches at high doses, held as of 12/2010 due to aches    Physical Exam:  Filed Vitals:   08/16/12 1335 08/16/12 1346  BP:  114/64  Pulse:  58  Temp: 97.1 F (36.2 C)   TempSrc: Oral   Height: 5\' 1"  (1.549 m)   Weight: 162 lb 3.2 oz (73.573 kg)   SpO2:  95%    Body mass index is 30.66 kg/(m^2).   Wt Readings from Last 2 Encounters:  08/16/12 162 lb 3.2 oz (73.573 kg)  07/31/12 161 lb 6.4 oz (73.211 kg)    General - No distress HEENT - no sinus tenderness,  no oral exudate, no LAN Cardiac - s1s2 Chest - no wheeze/rales Abdomen - soft, nontender Extremities - no edema Skin - no rashes Neurologic - normal strength Psychiatric - normal mood, behavior   Assessment/Plan:  [name], MD [facility] Pulmonary/Critical Care 08/16/2012, 1:49 PM Pager:  [phone] After 3pm call: [phone]

## 2012-08-16 NOTE — Assessment & Plan Note (Signed)
She has persistent hoarseness after recent viral uri.    She is to use sugarless candy to keep her mouth moist, and sip water when she has urge to cough.  She is to use salt water gargles.  Advised her to avoid clearing her throat or forcing a cough.

## 2012-08-20 ENCOUNTER — Other Ambulatory Visit: Payer: Self-pay | Admitting: *Deleted

## 2012-08-20 MED ORDER — GLUCOSE BLOOD VI STRP: ORAL_STRIP | Status: DC

## 2012-08-24 ENCOUNTER — Ambulatory Visit (INDEPENDENT_AMBULATORY_CARE_PROVIDER_SITE_OTHER): Payer: Medicare Other | Admitting: Family Medicine

## 2012-08-24 ENCOUNTER — Encounter: Payer: Self-pay | Admitting: Family Medicine

## 2012-08-24 ENCOUNTER — Ambulatory Visit: Payer: Medicare Other | Admitting: Family Medicine

## 2012-08-24 VITALS — BP 136/64 | HR 80 | Temp 98.1°F | Wt 165.0 lb

## 2012-08-24 DIAGNOSIS — M7020 Olecranon bursitis, unspecified elbow: Secondary | ICD-10-CM

## 2012-08-24 DIAGNOSIS — M7022 Olecranon bursitis, left elbow: Secondary | ICD-10-CM

## 2012-08-24 DIAGNOSIS — L989 Disorder of the skin and subcutaneous tissue, unspecified: Secondary | ICD-10-CM

## 2012-08-24 DIAGNOSIS — L570 Actinic keratosis: Secondary | ICD-10-CM

## 2012-08-24 DIAGNOSIS — Z7189 Other specified counseling: Secondary | ICD-10-CM

## 2012-08-24 NOTE — Patient Instructions (Addendum)
The spots on your arms should heal gradually.  Call if you have questions.  Call Dr. Dorita Sciara clinic about the scalp spot [phone]). Take care.  I work on your Rxs at the [employer].

## 2012-08-26 DIAGNOSIS — L989 Disorder of the skin and subcutaneous tissue, unspecified: Secondary | ICD-10-CM | POA: Insufficient documentation

## 2012-08-26 DIAGNOSIS — Z7189 Other specified counseling: Secondary | ICD-10-CM | POA: Insufficient documentation

## 2012-08-26 DIAGNOSIS — M7022 Olecranon bursitis, left elbow: Secondary | ICD-10-CM | POA: Insufficient documentation

## 2012-08-26 MED ORDER — INSULIN DETEMIR 100 UNIT/ML ~~LOC~~ SOLN: 30.0000 [IU] | Freq: Every day | SUBCUTANEOUS | Status: DC

## 2012-08-26 MED ORDER — ISOSORBIDE MONONITRATE ER 60 MG PO TB24: 60.0000 mg | ORAL_TABLET | Freq: Every day | ORAL | Status: DC

## 2012-08-26 MED ORDER — PANTOPRAZOLE SODIUM 40 MG PO TBEC: 40.0000 mg | DELAYED_RELEASE_TABLET | ORAL | Status: DC

## 2012-08-26 MED ORDER — FUROSEMIDE 40 MG PO TABS: 40.0000 mg | ORAL_TABLET | Freq: Every day | ORAL | Status: DC

## 2012-08-26 MED ORDER — POTASSIUM CHLORIDE CRYS ER 20 MEQ PO TBCR: 20.0000 meq | EXTENDED_RELEASE_TABLET | Freq: Every day | ORAL | Status: DC

## 2012-08-26 MED ORDER — INSULIN LISPRO 100 UNIT/ML ~~LOC~~ SOLN: 5.0000 [IU] | Freq: Three times a day (TID) | SUBCUTANEOUS | Status: DC | PRN

## 2012-08-26 MED ORDER — METOPROLOL SUCCINATE ER 25 MG PO TB24: 12.5000 mg | ORAL_TABLET | Freq: Every day | ORAL | Status: DC

## 2012-08-26 MED ORDER — BUDESONIDE-FORMOTEROL FUMARATE 160-4.5 MCG/ACT IN AERO: 2.0000 | INHALATION_SPRAY | Freq: Two times a day (BID) | RESPIRATORY_TRACT | Status: DC

## 2012-08-26 NOTE — Assessment & Plan Note (Addendum)
Done today, see above.  >25 min spent with face to face with patient, >50% counseling and/or coordinating care.

## 2012-08-26 NOTE — Progress Notes (Signed)
Recently to rehab after inpatient admission.  She has concerns about that (ie security bracelet), but it appears that this might have been routine/protocol at the facility. Discussed.   Pt appears to be able to make informed decisions today- she is alert and sharp with recall and conversation.  She would want her grandson [name] to be her designated Management consultant should she be incapacitated. Discussed.   L elbow lesion- has been pushing up to get out of a chair.  Now with puffy area, not ttp, on the L olecranon.   Skin lesions on arms.  Prev with similar treated with liq N2, resolved, but now with new lesions.  occ itchy.    She has a scalp lesion and she'll f/u with derm about that.  I gave her Dr. Dorita Sciara number.    Meds, vitals, and allergies reviewed.   ROS: See HPI.  Otherwise, noncontributory.  nad ncat Crusted scalp lesion on L side of scalp noted rrr Global dec in BS but ctab o/w L elbow with normal ROM but nontender olecranon bursitis noted, not red AKs noted on arms.  6R, 3L Speech, judgement intact and normal

## 2012-08-26 NOTE — Assessment & Plan Note (Signed)
She'll f/u with derm about that.  I gave her Dr. Dorita Sciara number.

## 2012-08-26 NOTE — Assessment & Plan Note (Signed)
Informed consent, verbal. Treated with liq N2 x3 at each lesion and tolerated well. No complications. F/u prn.

## 2012-08-26 NOTE — Assessment & Plan Note (Signed)
Avoid pressure and this should resolve.

## 2012-08-27 DIAGNOSIS — IMO0002 Reserved for concepts with insufficient information to code with codable children: Secondary | ICD-10-CM

## 2012-08-27 DIAGNOSIS — I509 Heart failure, unspecified: Secondary | ICD-10-CM

## 2012-08-27 DIAGNOSIS — J4489 Other specified chronic obstructive pulmonary disease: Secondary | ICD-10-CM

## 2012-08-27 DIAGNOSIS — I5033 Acute on chronic diastolic (congestive) heart failure: Secondary | ICD-10-CM

## 2012-08-27 DIAGNOSIS — J449 Chronic obstructive pulmonary disease, unspecified: Secondary | ICD-10-CM

## 2012-08-28 ENCOUNTER — Other Ambulatory Visit: Payer: Self-pay | Admitting: *Deleted

## 2012-08-28 ENCOUNTER — Encounter: Payer: Self-pay | Admitting: *Deleted

## 2012-08-29 MED ORDER — GLUCOSE BLOOD VI STRP: ORAL_STRIP | Status: DC

## 2012-08-29 NOTE — Addendum Note (Signed)
Addended by: Patience Musca on: 08/29/2012 04:15 PM   Modules accepted: Orders

## 2012-08-29 NOTE — Telephone Encounter (Signed)
Call from [facility] and [facility] asking for specific instructions for test strips, please advise

## 2012-08-29 NOTE — Telephone Encounter (Signed)
[facility] [place] request specific instructions on how often pt to test blood sugar. Spoke with pt and she is checking blood sugar before and after each meal and before bedtime due to low blood sugar readings during the night (35-50 at times). Pt is presently on 2 insulins. Please advise directions for checking blood sugar.

## 2012-08-29 NOTE — Telephone Encounter (Signed)
Sent, appended.

## 2012-08-29 NOTE — Addendum Note (Signed)
Addended by: Joaquim Nam on: 08/29/2012 07:48 PM   Modules accepted: Orders

## 2012-08-29 NOTE — Addendum Note (Signed)
Addended by: Sueanne Margarita on: 08/29/2012 03:46 PM   Modules accepted: Orders

## 2012-09-03 ENCOUNTER — Other Ambulatory Visit (HOSPITAL_COMMUNITY): Payer: Self-pay | Admitting: Cardiovascular Disease

## 2012-09-03 DIAGNOSIS — I10 Essential (primary) hypertension: Secondary | ICD-10-CM

## 2012-09-03 DIAGNOSIS — I2510 Atherosclerotic heart disease of native coronary artery without angina pectoris: Secondary | ICD-10-CM

## 2012-10-01 ENCOUNTER — Encounter (HOSPITAL_COMMUNITY): Payer: Medicare Other

## 2012-10-06 ENCOUNTER — Other Ambulatory Visit: Payer: Self-pay | Admitting: Pulmonary Disease

## 2012-10-10 ENCOUNTER — Telehealth: Payer: Self-pay | Admitting: Pulmonary Disease

## 2012-10-10 MED ORDER — IPRATROPIUM-ALBUTEROL 20-100 MCG/ACT IN AERS: 1.0000 | INHALATION_SPRAY | Freq: Four times a day (QID) | RESPIRATORY_TRACT | Status: DC

## 2012-10-10 NOTE — Telephone Encounter (Signed)
Rx has been sent in. 

## 2012-10-10 NOTE — Telephone Encounter (Signed)
Okay to send script for combivent.  Dispense one with 5 refills.

## 2012-10-10 NOTE — Telephone Encounter (Signed)
We do not have combivent respimat on pt med list. Dr. Craige Cotta is this okay to refill? Please advise thanks

## 2012-10-26 ENCOUNTER — Other Ambulatory Visit: Payer: Self-pay | Admitting: Internal Medicine

## 2012-10-29 ENCOUNTER — Telehealth: Payer: Self-pay

## 2012-10-29 NOTE — Telephone Encounter (Signed)
[facility] faxed refill Levemir flexpen; I called [facility] and they will contact [facility] for transferring refills.

## 2012-11-05 ENCOUNTER — Inpatient Hospital Stay (HOSPITAL_COMMUNITY): Admission: RE | Admit: 2012-11-05 | Payer: Medicare Other | Source: Ambulatory Visit

## 2012-11-08 ENCOUNTER — Other Ambulatory Visit: Payer: Self-pay | Admitting: *Deleted

## 2012-11-08 MED ORDER — ALPRAZOLAM 0.25 MG PO TABS: 0.2500 mg | ORAL_TABLET | Freq: Two times a day (BID) | ORAL | Status: DC | PRN

## 2012-11-08 NOTE — Telephone Encounter (Signed)
Please call in

## 2012-11-08 NOTE — Telephone Encounter (Signed)
Last filled 10/10/12

## 2012-11-09 NOTE — Telephone Encounter (Signed)
Medication phoned to pharmacy.  

## 2012-11-13 ENCOUNTER — Other Ambulatory Visit: Payer: Self-pay | Admitting: *Deleted

## 2012-11-13 MED ORDER — ISOSORBIDE MONONITRATE ER 60 MG PO TB24: 60.0000 mg | ORAL_TABLET | Freq: Every day | ORAL | Status: DC

## 2012-11-20 ENCOUNTER — Other Ambulatory Visit: Payer: Self-pay | Admitting: Internal Medicine

## 2012-12-06 ENCOUNTER — Encounter: Payer: Self-pay | Admitting: Family Medicine

## 2012-12-06 ENCOUNTER — Ambulatory Visit (INDEPENDENT_AMBULATORY_CARE_PROVIDER_SITE_OTHER): Payer: Medicare Other | Admitting: Family Medicine

## 2012-12-06 VITALS — BP 120/80 | HR 81 | Temp 97.8°F | Wt 161.2 lb

## 2012-12-06 DIAGNOSIS — E1129 Type 2 diabetes mellitus with other diabetic kidney complication: Secondary | ICD-10-CM

## 2012-12-06 DIAGNOSIS — M25579 Pain in unspecified ankle and joints of unspecified foot: Secondary | ICD-10-CM

## 2012-12-06 DIAGNOSIS — E1122 Type 2 diabetes mellitus with diabetic chronic kidney disease: Secondary | ICD-10-CM

## 2012-12-06 DIAGNOSIS — M25571 Pain in right ankle and joints of right foot: Secondary | ICD-10-CM

## 2012-12-06 MED ORDER — WARFARIN SODIUM 4 MG PO TABS: 4.0000 mg | ORAL_TABLET | Freq: Every evening | ORAL | Status: DC

## 2012-12-06 NOTE — Assessment & Plan Note (Signed)
Likely not a fx, would ice it and f/u prn.  She agrees.

## 2012-12-06 NOTE — Progress Notes (Signed)
She dropped a vacuum canister (~3lbs) on her R foot about 2 days ago.  The redness is better. The R foot is bruised. Pain is better.  She can bear weight.    She asks about levemir and symbicort samples.  We don't have either.    Meds, vitals, and allergies reviewed.   ROS: See HPI.  Otherwise, noncontributory.  nad R foot with a small hematoma on the dorsum of the foot, near distal 3rd MT.  Slightly ttp but can bear weight Bruising noted on 2nd-4th toes with ROM intact Foot not ttp o/w.  DP pulse intact

## 2012-12-06 NOTE — Patient Instructions (Addendum)
Ice your foot. I'll ask for the clinic to call about med assistance from the drug company (insulin and the inhaler).  Take care.

## 2012-12-06 NOTE — Assessment & Plan Note (Signed)
We'll see about med assistance for patient.

## 2012-12-07 ENCOUNTER — Telehealth: Payer: Self-pay | Admitting: *Deleted

## 2012-12-07 NOTE — Telephone Encounter (Signed)
Message copied by Eliezer Bottom on Fri Dec 07, 2012  3:00 PM ------      Message from: Annamarie Major      Created: Thu Dec 06, 2012  2:09 PM       Jacki Cones usually handles that.  I'll forward this message to her.      ----- Message -----         From: Joaquim Nam, MD         Sent: 12/06/2012   1:48 PM           To: Annamarie Major, CMA            Who could potentially help her with checking on med assistance?      levemir and symbicort.       Thanks.        ------

## 2012-12-07 NOTE — Telephone Encounter (Signed)
I'll sign it, thanks.

## 2012-12-07 NOTE — Telephone Encounter (Signed)
Pt is requesting patient assistance for symbicort and levemir.  I have printed off forms for both- levemir [place] that [place], which she will not be until October, so that application may need to wait.    Sample of symbicort given- 4 boxes, lot number [id number], exp 01/2014. Patient states her daughter will pick up next week, samples placed at front desk.  Form for you to sign for simbicort assistance is on your desk.  Pt will complete her part of the form next week.

## 2012-12-10 ENCOUNTER — Other Ambulatory Visit: Payer: Self-pay | Admitting: *Deleted

## 2012-12-10 ENCOUNTER — Other Ambulatory Visit: Payer: Self-pay | Admitting: Internal Medicine

## 2012-12-10 MED ORDER — AMIODARONE HCL 100 MG PO TABS: 100.0000 mg | ORAL_TABLET | Freq: Every day | ORAL | Status: DC

## 2012-12-13 ENCOUNTER — Encounter (HOSPITAL_COMMUNITY): Payer: Self-pay | Admitting: *Deleted

## 2012-12-13 ENCOUNTER — Emergency Department (HOSPITAL_COMMUNITY): Payer: Medicare Other

## 2012-12-13 ENCOUNTER — Emergency Department (HOSPITAL_COMMUNITY)
Admission: EM | Admit: 2012-12-13 | Discharge: 2012-12-14 | Disposition: A | Discharge status: Home / Self Care | Payer: Medicare Other | Attending: Emergency Medicine | Admitting: Emergency Medicine

## 2012-12-13 DIAGNOSIS — I209 Angina pectoris, unspecified: Secondary | ICD-10-CM | POA: Insufficient documentation

## 2012-12-13 DIAGNOSIS — R609 Edema, unspecified: Secondary | ICD-10-CM | POA: Insufficient documentation

## 2012-12-13 DIAGNOSIS — Z794 Long term (current) use of insulin: Secondary | ICD-10-CM | POA: Insufficient documentation

## 2012-12-13 DIAGNOSIS — Z9981 Dependence on supplemental oxygen: Secondary | ICD-10-CM | POA: Insufficient documentation

## 2012-12-13 DIAGNOSIS — Y9389 Activity, other specified: Secondary | ICD-10-CM | POA: Insufficient documentation

## 2012-12-13 DIAGNOSIS — I509 Heart failure, unspecified: Secondary | ICD-10-CM | POA: Insufficient documentation

## 2012-12-13 DIAGNOSIS — J9610 Chronic respiratory failure, unspecified whether with hypoxia or hypercapnia: Secondary | ICD-10-CM | POA: Insufficient documentation

## 2012-12-13 DIAGNOSIS — Z87891 Personal history of nicotine dependence: Secondary | ICD-10-CM | POA: Insufficient documentation

## 2012-12-13 DIAGNOSIS — E785 Hyperlipidemia, unspecified: Secondary | ICD-10-CM | POA: Insufficient documentation

## 2012-12-13 DIAGNOSIS — Z7901 Long term (current) use of anticoagulants: Secondary | ICD-10-CM | POA: Insufficient documentation

## 2012-12-13 DIAGNOSIS — IMO0002 Reserved for concepts with insufficient information to code with codable children: Secondary | ICD-10-CM | POA: Insufficient documentation

## 2012-12-13 DIAGNOSIS — Y9289 Other specified places as the place of occurrence of the external cause: Secondary | ICD-10-CM | POA: Insufficient documentation

## 2012-12-13 DIAGNOSIS — J441 Chronic obstructive pulmonary disease with (acute) exacerbation: Secondary | ICD-10-CM | POA: Insufficient documentation

## 2012-12-13 DIAGNOSIS — W208XXA Other cause of strike by thrown, projected or falling object, initial encounter: Secondary | ICD-10-CM | POA: Insufficient documentation

## 2012-12-13 DIAGNOSIS — S9031XA Contusion of right foot, initial encounter: Secondary | ICD-10-CM

## 2012-12-13 DIAGNOSIS — F411 Generalized anxiety disorder: Secondary | ICD-10-CM | POA: Insufficient documentation

## 2012-12-13 DIAGNOSIS — Z7982 Long term (current) use of aspirin: Secondary | ICD-10-CM | POA: Insufficient documentation

## 2012-12-13 DIAGNOSIS — I10 Essential (primary) hypertension: Secondary | ICD-10-CM | POA: Insufficient documentation

## 2012-12-13 DIAGNOSIS — Z9861 Coronary angioplasty status: Secondary | ICD-10-CM | POA: Insufficient documentation

## 2012-12-13 DIAGNOSIS — Z8679 Personal history of other diseases of the circulatory system: Secondary | ICD-10-CM | POA: Insufficient documentation

## 2012-12-13 DIAGNOSIS — Z8739 Personal history of other diseases of the musculoskeletal system and connective tissue: Secondary | ICD-10-CM | POA: Insufficient documentation

## 2012-12-13 DIAGNOSIS — S7010XA Contusion of unspecified thigh, initial encounter: Secondary | ICD-10-CM | POA: Insufficient documentation

## 2012-12-13 DIAGNOSIS — I4891 Unspecified atrial fibrillation: Secondary | ICD-10-CM | POA: Insufficient documentation

## 2012-12-13 DIAGNOSIS — K219 Gastro-esophageal reflux disease without esophagitis: Secondary | ICD-10-CM | POA: Insufficient documentation

## 2012-12-13 DIAGNOSIS — I2510 Atherosclerotic heart disease of native coronary artery without angina pectoris: Secondary | ICD-10-CM | POA: Insufficient documentation

## 2012-12-13 DIAGNOSIS — Z79899 Other long term (current) drug therapy: Secondary | ICD-10-CM | POA: Insufficient documentation

## 2012-12-13 DIAGNOSIS — E119 Type 2 diabetes mellitus without complications: Secondary | ICD-10-CM | POA: Insufficient documentation

## 2012-12-13 LAB — GLUCOSE, CAPILLARY

## 2012-12-13 LAB — POCT I-STAT, CHEM 8
Calcium, Ion: 1.16 mmol/L (ref 1.13–1.30)
HCT: 32 % — ABNORMAL LOW (ref 36.0–46.0)
TCO2: 31 mmol/L (ref 0–100)

## 2012-12-13 LAB — POCT I-STAT TROPONIN I: Troponin i, poc: 0.05 ng/mL (ref 0.00–0.08)

## 2012-12-13 LAB — COMPREHENSIVE METABOLIC PANEL
AST: 18 U/L (ref 0–37)
Albumin: 3.1 g/dL — ABNORMAL LOW (ref 3.5–5.2)
Calcium: 9.4 mg/dL (ref 8.4–10.5)
Chloride: 104 mEq/L (ref 96–112)
Creatinine, Ser: 1.3 mg/dL — ABNORMAL HIGH (ref 0.50–1.10)
Total Bilirubin: 0.4 mg/dL (ref 0.3–1.2)

## 2012-12-13 LAB — CBC WITH DIFFERENTIAL/PLATELET
Basophils Absolute: 0.1 10*3/uL (ref 0.0–0.1)
Basophils Relative: 1 % (ref 0–1)
HCT: 30.4 % — ABNORMAL LOW (ref 36.0–46.0)
MCHC: 32.2 g/dL (ref 30.0–36.0)
Monocytes Absolute: 0.5 10*3/uL (ref 0.1–1.0)
Neutro Abs: 4.8 10*3/uL (ref 1.7–7.7)
RDW: 15.5 % (ref 11.5–15.5)

## 2012-12-13 NOTE — ED Notes (Addendum)
Pt reports that she dropped a vacuum on her (R) foot 8 days ago.  States that she has been to her PCP and was told to monitor her foot.  pts foot noted to be red, swollen and bruised.  Pt reports extreme tenderness and sensitivity.  Pts bilateral ankles and feet have pitting edema.  Hx of CHF.  Pt speaking in complete sentences.  Pt reports SOB and chest pain last night.  Pt reports SOB at this time.   Pt has home O2 when needed, states that she does not need it now.

## 2012-12-13 NOTE — ED Notes (Signed)
Pt reports in addition to swollen right foot, increased in SOB since yesterday. Pt reports yesterday she had intermittent central CP. Took 1 nitro which relieved pain. Denies any CP today. Awoke this 0100 this morning due to increase in SOB. Pt reports SOB entire day, wearing 2.5 L [place] with little relief. Pt reports increase in pitting edema to bilateral lower legs; +2 pitting edema noted to bilateral ankles and lower legs.

## 2012-12-13 NOTE — ED Provider Notes (Signed)
History     CSN: [id number]  Arrival date & time 12/13/12  2003   First MD Initiated Contact with Patient 12/13/12 2141      Chief Complaint  Patient presents with  . Foot Injury  . Shortness of Breath    (Consider location/radiation/quality/duration/timing/severity/associated sxs/prior treatment) HPI Comments: [name] is a 79 y.o. female who complains of painful swelling of her right foot since dropping a plastic canister onto it, 8 days ago. Since then, she has had gradually worse swelling, and pain. She also has edema of her lower legs and  shortness of breath, that is worse with ambulation. She has not had chest pain, weakness, or dizziness. She uses oxygen, and night to help her rest. She is taking her usual medications, without relief. She is on warfarin and had a level checked yesterday, she does not know the results, yet. No other known modifying factors.   Patient is a 79 y.o. female presenting with foot injury and shortness of breath. The history is provided by the patient.  Foot Injury Shortness of Breath   Past Medical History  Diagnosis Date  . Hyperlipidemia   . Hypertension   . Anxiety   . GERD (gastroesophageal reflux disease)   . CAD (coronary artery disease)     Cath Aug. 2011 > medical therapy recommended  . Diastolic dysfunction     Grade II  . RBBB (right bundle branch block)     Chronic  . Paroxysmal atrial fibrillation   . Hypoxemic respiratory failure, chronic     uses 2.5 liters of oxygen with sleep  . COPD (chronic obstructive pulmonary disease)     GOLD 4 - PFT 06/08/10 FEV1 0.93 (62%), FEV1% 60, TLC 2.84 (69%0, DLCO 54%, +BD  . Secondary pulmonary hypertension   . Edema   . Angina   . Shortness of breath   . CHF (congestive heart failure)   . Diabetes mellitus     Type II - goal A1C is 8, to avoid hypoglycemia  . Bradycardia 08/24/2011    Past Surgical History  Procedure Laterality Date  . Rotator cuff repair  07/2005    right   . Abdominal hysterectomy    . Cardiac stents       x2  . Cardiac catheterization    . Coronary angioplasty      Family History  Problem Relation Age of Onset  . Asthma Mother   . Cancer Mother     Skin  . Heart disease Mother   . Asthma Father   . Emphysema Father   . Asthma Brother   . Asthma Brother     History  Substance Use Topics  . Smoking status: Former Smoker -- 1.50 packs/day for 15 years    Types: Cigarettes    Quit date: 06/27/1993  . Smokeless tobacco: Never Used     Comment: Smoked 1.5 packs per day for 15 years, quit in 1995.  Marland Kitchen Alcohol Use: Yes     Comment: Occasional    OB History   Grav Para Term Preterm Abortions TAB SAB Ect Mult Living                  Review of Systems  Respiratory: Positive for shortness of breath.   All other systems reviewed and are negative.    Allergies  Atorvastatin; Codeine; Iohexol; Morphine; and Rosuvastatin  Home Medications   Current Outpatient Rx  Name  Route  Sig  Dispense  Refill  .  ALPRAZolam (XANAX) 0.25 MG tablet   Oral   Take 1 tablet (0.25 mg total) by mouth 2 (two) times daily as needed. for anxiety   30 tablet   2   . amiodarone (PACERONE) 100 MG tablet   Oral   Take 1 tablet (100 mg total) by mouth daily.   30 tablet   11   . aspirin 81 MG chewable tablet   Oral   Chew 81 mg by mouth daily.         Marland Kitchen atorvastatin (LIPITOR) 40 MG tablet   Oral   Take 40 mg by mouth every evening.         . budesonide-formoterol (SYMBICORT) 160-4.5 MCG/ACT inhaler   Inhalation   Inhale 2 puffs into the lungs 2 (two) times daily.   1 Inhaler   12   . Cholecalciferol (VITAMIN D PO)   Oral   Take 1 capsule by mouth every evening.         . furosemide (LASIX) 40 MG tablet   Oral   Take 1 tablet (40 mg total) by mouth daily.   30 tablet   12   . glucosamine-chondroitin 500-400 MG tablet   Oral   Take 1 tablet by mouth 2 (two) times daily.         . insulin detemir (LEVEMIR) 100  UNIT/ML injection   Subcutaneous   Inject 20 Units into the skin every morning.         . insulin regular (NOVOLIN R,HUMULIN R) 100 units/mL injection   Subcutaneous   Inject 0-8 Units into the skin 3 (three) times daily before meals. Sliding scale         . Ipratropium-Albuterol (COMBIVENT) 20-100 MCG/ACT AERS respimat   Inhalation   Inhale 1 puff into the lungs every 6 (six) hours.   4 g   5   . isosorbide mononitrate (IMDUR) 60 MG 24 hr tablet   Oral   Take 1 tablet (60 mg total) by mouth daily.   30 tablet   12   . metoprolol succinate (TOPROL-XL) 25 MG 24 hr tablet   Oral   Take 0.5 tablets (12.5 mg total) by mouth daily.   15 tablet   12   . Multiple Vitamins-Minerals (PRESERVISION/LUTEIN) CAPS   Oral   Take 1 capsule by mouth daily.          Marland Kitchen NIFEdipine (PROCARDIA-XL/ADALAT CC) 30 MG 24 hr tablet   Oral   Take 30 mg by mouth daily.         . pantoprazole (PROTONIX) 40 MG tablet   Oral   Take 1 tablet (40 mg total) by mouth every morning.   30 tablet   12   . potassium chloride SA (KLOR-CON M20) 20 MEQ tablet   Oral   Take 1 tablet (20 mEq total) by mouth daily.   30 tablet   12   . senna-docusate (SENOKOT-S) 8.6-50 MG per tablet   Oral   Take 1 tablet by mouth 2 (two) times daily.         . simethicone (MYLICON) 80 MG chewable tablet   Oral   Chew 80 mg by mouth at bedtime as needed for flatulence.         . vitamin C (ASCORBIC ACID) 500 MG tablet   Oral   Take 500 mg by mouth every evening.          . warfarin (COUMADIN) 4 MG tablet   Oral  Take 1 tablet (4 mg total) by mouth every evening.           BP 147/80  Pulse 64  Temp(Src) 97.9 F (36.6 C) (Oral)  Resp 18  SpO2 100%  Physical Exam  Nursing note and vitals reviewed. Constitutional: She is oriented to person, place, and time. She appears well-developed and well-nourished.  HENT:  Head: Normocephalic and atraumatic.  Eyes: Conjunctivae and EOM are normal.  Pupils are equal, round, and reactive to light.  Neck: Normal range of motion and phonation normal. Neck supple.  Cardiovascular: Normal rate, regular rhythm and intact distal pulses.   Pulmonary/Chest: Effort normal and breath sounds normal. No respiratory distress. She has no wheezes. She exhibits no tenderness.  Abdominal: Soft. She exhibits no distension. There is no tenderness. There is no guarding.  Musculoskeletal: Normal range of motion. She exhibits edema (1+ bilateral lower extremities).  Right dorsal mid foot has an area of redness, swelling, and central ecchymosis, consistent with bruising. There is no fluctuance proximal streaking or drainage. There is distal ecchymosis in several of her toes. There is normal. Range of motion of the right foot and right ankle  Neurological: She is alert and oriented to person, place, and time. She has normal strength. She exhibits normal muscle tone.  Skin: Skin is warm and dry.  Psychiatric: She has a normal mood and affect. Her behavior is normal. Judgment and thought content normal.    ED Course  Procedures (including critical care time)  Findings discussed with daughter, who was with her, in the room  Labs Reviewed  CBC WITH DIFFERENTIAL - Abnormal; Notable for the following:    RBC 3.31 (*)    Hemoglobin 9.8 (*)    HCT 30.4 (*)    All other components within normal limits  COMPREHENSIVE METABOLIC PANEL - Abnormal; Notable for the following:    Glucose, Bld 204 (*)    BUN 25 (*)    Creatinine, Ser 1.30 (*)    Albumin 3.1 (*)    GFR calc non Af Amer 37 (*)    GFR calc Af Amer 43 (*)    All other components within normal limits  PRO B NATRIURETIC PEPTIDE - Abnormal; Notable for the following:    Pro B Natriuretic peptide (BNP) 998.9 (*)    All other components within normal limits  GLUCOSE, CAPILLARY - Abnormal; Notable for the following:    Glucose-Capillary 212 (*)    All other components within normal limits  POCT I-STAT, CHEM 8  - Abnormal; Notable for the following:    BUN 30 (*)    Creatinine, Ser 1.40 (*)    Glucose, Bld 218 (*)    Hemoglobin 10.9 (*)    HCT 32.0 (*)    All other components within normal limits  POCT I-STAT TROPONIN I   Dg Chest 2 View  12/13/2012   *RADIOLOGY REPORT*  Clinical Data: Short of breath  CHEST - 2 VIEW  Comparison: Prior chest x-ray 07/30/2012  Findings: Stable cardiomegaly and pulmonary vascular congestion with mild interstitial edema.  Likely bibasilar atelectasis versus scarring.  Cardiac and mediastinal contours are similar to prior. Atherosclerotic calcification again noted in the transverse aorta. Lungs remain hyperexpanded with coarse central airway thickening consistent with underlying COPD.  No acute osseous abnormality.  IMPRESSION:  1.  Similar degree of cardiomegaly and chronic versus recurrent mild pulmonary edema. 2.  COPD 3.  Aortic atherosclerosis   Original Report Authenticated By: Malachy Moan, M.D.  Dg Foot Complete Right  12/13/2012   *RADIOLOGY REPORT*  Clinical Data: Foot injury  RIGHT FOOT COMPLETE - 3+ VIEW  Comparison: None  Findings: Soft tissue swelling over the dorsal forefoot.  No acute fracture or malalignment.  Degenerative changes noted at the great toe MTP joints.  In the distal interphalangeal joints consistent with degenerative osteoarthritis.  The bones are mildly osteopenic. Calcaneal spurring noted incidentally.  IMPRESSION: No acute fracture or malalignment.   Original Report Authenticated By: Malachy Moan, M.D.     1. Contusion of foot, right, initial encounter       MDM  Evaluation is consistent with contusion of the foot without fracture, infection or anticipated risk of worsening.    Nursing Notes Reviewed/ Care Coordinated, and agree without changes. Applicable Imaging Reviewed.  Interpretation of Laboratory Data incorporated into ED treatment   Plan: Home Medications- Tylenol, for pain; Home Treatments and Observation- rest,  elevation, and heat to affected area; return here if the recommended treatment, does not improve the symptoms; Recommended follow up- PCP in 1 week, for checkup       Flint Melter, MD 12/14/12 [phone]

## 2012-12-13 NOTE — ED Notes (Signed)
MD Wentz at bedside.  

## 2012-12-14 NOTE — Telephone Encounter (Signed)
I have the forms.  Forms for patient to complete have been mailed to her

## 2012-12-25 ENCOUNTER — Encounter: Payer: Self-pay | Admitting: Radiology

## 2012-12-25 ENCOUNTER — Other Ambulatory Visit: Payer: Self-pay | Admitting: Cardiovascular Disease

## 2012-12-25 ENCOUNTER — Ambulatory Visit: Payer: Medicare Other | Admitting: Family Medicine

## 2012-12-26 ENCOUNTER — Ambulatory Visit (INDEPENDENT_AMBULATORY_CARE_PROVIDER_SITE_OTHER): Payer: Medicare Other | Admitting: Family Medicine

## 2012-12-26 ENCOUNTER — Encounter: Payer: Self-pay | Admitting: Family Medicine

## 2012-12-26 VITALS — BP 148/78 | HR 68 | Temp 98.0°F | Wt 162.5 lb

## 2012-12-26 DIAGNOSIS — L570 Actinic keratosis: Secondary | ICD-10-CM

## 2012-12-26 DIAGNOSIS — M25571 Pain in right ankle and joints of right foot: Secondary | ICD-10-CM

## 2012-12-26 DIAGNOSIS — M25579 Pain in unspecified ankle and joints of unspecified foot: Secondary | ICD-10-CM

## 2012-12-26 NOTE — Assessment & Plan Note (Signed)
Improved

## 2012-12-26 NOTE — Progress Notes (Signed)
H/o AKs prev noted, would like tx again with liq N2.  Prev treated with response.  Noted again recently, itchy.   Foot pain improved, imaging noted from ER.  Still with some dorsal pain on the R foot at distal 2nd MT, but improved from prev.   Meds, vitals, and allergies reviewed.   ROS: See HPI.  Otherwise, noncontributory.  nad R foot w/o bruising or edema.  Mildly ttp on dorsum on the R foot at distal 2nd MT.   AKs noted on the arms and legs.

## 2012-12-26 NOTE — Patient Instructions (Addendum)
No charge for visit.  I am sorry that we don't have the nitrogen to treat the spots today.  Take care.

## 2012-12-26 NOTE — Assessment & Plan Note (Signed)
I didn't realize until OV that we are out of liq N2 and due for refill tomorrow.  I apologized to patient and we'll have to tx at f/u appointment.  She understood, accepted apology. No charge for visit.

## 2012-12-31 ENCOUNTER — Other Ambulatory Visit: Payer: Self-pay | Admitting: *Deleted

## 2012-12-31 ENCOUNTER — Telehealth: Payer: Self-pay | Admitting: Family Medicine

## 2012-12-31 MED ORDER — RELION ULTIMA GLUCOSE SYSTEM W/DEVICE KIT: 1.0000 | PACK | Freq: Every day | Status: DC

## 2012-12-31 NOTE — Telephone Encounter (Signed)
Patient dropped and broke her blood sugar monitor.  She needs a RX for a new monitor sent to the [facility]	[place].  If she doesn't have a RX, then she will have to pay for it.  She needs this asap because her blood sugar bottoms out often. Reli-on Ultima is the exact monitor she needs prescribed.

## 2013-02-10 ENCOUNTER — Emergency Department (HOSPITAL_COMMUNITY): Payer: Medicare Other

## 2013-02-10 ENCOUNTER — Emergency Department (HOSPITAL_COMMUNITY)
Admission: EM | Admit: 2013-02-10 | Discharge: 2013-02-10 | Disposition: A | Discharge status: Home / Self Care | Payer: Medicare Other | Attending: Emergency Medicine | Admitting: Emergency Medicine

## 2013-02-10 ENCOUNTER — Other Ambulatory Visit: Payer: Self-pay

## 2013-02-10 ENCOUNTER — Encounter (HOSPITAL_COMMUNITY): Payer: Self-pay | Admitting: *Deleted

## 2013-02-10 DIAGNOSIS — Z9861 Coronary angioplasty status: Secondary | ICD-10-CM | POA: Insufficient documentation

## 2013-02-10 DIAGNOSIS — I4891 Unspecified atrial fibrillation: Secondary | ICD-10-CM | POA: Insufficient documentation

## 2013-02-10 DIAGNOSIS — E785 Hyperlipidemia, unspecified: Secondary | ICD-10-CM | POA: Insufficient documentation

## 2013-02-10 DIAGNOSIS — Z8679 Personal history of other diseases of the circulatory system: Secondary | ICD-10-CM | POA: Insufficient documentation

## 2013-02-10 DIAGNOSIS — R197 Diarrhea, unspecified: Secondary | ICD-10-CM | POA: Insufficient documentation

## 2013-02-10 DIAGNOSIS — R0789 Other chest pain: Secondary | ICD-10-CM | POA: Insufficient documentation

## 2013-02-10 DIAGNOSIS — F411 Generalized anxiety disorder: Secondary | ICD-10-CM | POA: Insufficient documentation

## 2013-02-10 DIAGNOSIS — R079 Chest pain, unspecified: Secondary | ICD-10-CM

## 2013-02-10 DIAGNOSIS — Z8709 Personal history of other diseases of the respiratory system: Secondary | ICD-10-CM | POA: Insufficient documentation

## 2013-02-10 DIAGNOSIS — R141 Gas pain: Secondary | ICD-10-CM | POA: Insufficient documentation

## 2013-02-10 DIAGNOSIS — I10 Essential (primary) hypertension: Secondary | ICD-10-CM | POA: Insufficient documentation

## 2013-02-10 DIAGNOSIS — I2510 Atherosclerotic heart disease of native coronary artery without angina pectoris: Secondary | ICD-10-CM | POA: Insufficient documentation

## 2013-02-10 DIAGNOSIS — Z79899 Other long term (current) drug therapy: Secondary | ICD-10-CM | POA: Insufficient documentation

## 2013-02-10 DIAGNOSIS — I509 Heart failure, unspecified: Secondary | ICD-10-CM | POA: Insufficient documentation

## 2013-02-10 DIAGNOSIS — R109 Unspecified abdominal pain: Secondary | ICD-10-CM | POA: Insufficient documentation

## 2013-02-10 DIAGNOSIS — Z794 Long term (current) use of insulin: Secondary | ICD-10-CM | POA: Insufficient documentation

## 2013-02-10 DIAGNOSIS — K5900 Constipation, unspecified: Secondary | ICD-10-CM | POA: Insufficient documentation

## 2013-02-10 DIAGNOSIS — R0602 Shortness of breath: Secondary | ICD-10-CM

## 2013-02-10 DIAGNOSIS — K219 Gastro-esophageal reflux disease without esophagitis: Secondary | ICD-10-CM | POA: Insufficient documentation

## 2013-02-10 DIAGNOSIS — Z87891 Personal history of nicotine dependence: Secondary | ICD-10-CM | POA: Insufficient documentation

## 2013-02-10 DIAGNOSIS — R142 Eructation: Secondary | ICD-10-CM | POA: Insufficient documentation

## 2013-02-10 DIAGNOSIS — Z7982 Long term (current) use of aspirin: Secondary | ICD-10-CM | POA: Insufficient documentation

## 2013-02-10 DIAGNOSIS — E119 Type 2 diabetes mellitus without complications: Secondary | ICD-10-CM | POA: Insufficient documentation

## 2013-02-10 LAB — CBC
Hemoglobin: 10.2 g/dL — ABNORMAL LOW (ref 12.0–15.0)
MCH: 30 pg (ref 26.0–34.0)
MCHC: 32.4 g/dL (ref 30.0–36.0)
MCV: 92.6 fL (ref 78.0–100.0)

## 2013-02-10 LAB — POCT I-STAT TROPONIN I: Troponin i, poc: 0.04 ng/mL (ref 0.00–0.08)

## 2013-02-10 LAB — BASIC METABOLIC PANEL
Calcium: 9.2 mg/dL (ref 8.4–10.5)
Creatinine, Ser: 1.27 mg/dL — ABNORMAL HIGH (ref 0.50–1.10)
GFR calc non Af Amer: 38 mL/min — ABNORMAL LOW (ref >90)
Glucose, Bld: 170 mg/dL — ABNORMAL HIGH (ref 70–99)
Sodium: 138 mEq/L (ref 135–145)

## 2013-02-10 MED ORDER — IPRATROPIUM-ALBUTEROL 20-100 MCG/ACT IN AERS
1.0000 | INHALATION_SPRAY | Freq: Four times a day (QID) | RESPIRATORY_TRACT | Status: DC
Start: 2013-02-10 — End: 2013-02-10
  Administered 2013-02-10: 1 via RESPIRATORY_TRACT
  Filled 2013-02-10: qty 4

## 2013-02-10 NOTE — ED Provider Notes (Signed)
CSN: [id number]     Arrival date & time 02/10/13  1308 History     First MD Initiated Contact with Patient 02/10/13 1345     Chief Complaint  Patient presents with  . Chest Pain  . Shortness of Breath   Patient has a multitude of complaints. She did have a bowel movement for several days. Straight MiraLax for 2 days. Go lightly for the last 2 days. She'll had a  "explosive" bowel movement this morning. No blood. She complains her family frequently she is short of breath and chest pain so they want her to be evaluated for this. She took a nitroglycerin yesterday and her pain went away. She describes her pain as a feeling of static electricity her chest. She does live the swelling in her left ankle. This been prescribed who over the last year by her physicians. HPI  Past Medical History  Diagnosis Date  . Hyperlipidemia   . Hypertension   . Anxiety   . GERD (gastroesophageal reflux disease)   . CAD (coronary artery disease)     Cath Aug. 2011 > medical therapy recommended  . Diastolic dysfunction     Grade II  . RBBB (right bundle branch block)     Chronic  . Paroxysmal atrial fibrillation   . Hypoxemic respiratory failure, chronic     uses 2.5 liters of oxygen with sleep  . COPD (chronic obstructive pulmonary disease)     GOLD 4 - PFT 06/08/10 FEV1 0.93 (62%), FEV1% 60, TLC 2.84 (69%0, DLCO 54%, +BD  . Secondary pulmonary hypertension   . Edema   . Angina   . Shortness of breath   . CHF (congestive heart failure)   . Diabetes mellitus     Type II - goal A1C is 8, to avoid hypoglycemia  . Bradycardia 08/24/2011   Past Surgical History  Procedure Laterality Date  . Rotator cuff repair  07/2005    right  . Abdominal hysterectomy    . Cardiac stents       x2  . Cardiac catheterization    . Coronary angioplasty     Family History  Problem Relation Age of Onset  . Asthma Mother   . Cancer Mother     Skin  . Heart disease Mother   . Asthma Father   . Emphysema Father     . Asthma Brother   . Asthma Brother    History  Substance Use Topics  . Smoking status: Former Smoker -- 1.50 packs/day for 15 years    Types: Cigarettes    Quit date: 06/27/1993  . Smokeless tobacco: Never Used     Comment: Smoked 1.5 packs per day for 15 years, quit in 1995.  Marland Kitchen Alcohol Use: Yes     Comment: Occasional   OB History   Grav Para Term Preterm Abortions TAB SAB Ect Mult Living                 Review of Systems  Constitutional: Negative for fever, chills, diaphoresis, appetite change and fatigue.  HENT: Negative for sore throat, mouth sores and trouble swallowing.   Eyes: Negative for visual disturbance.  Respiratory: Positive for chest tightness and shortness of breath. Negative for cough and wheezing.   Cardiovascular: Positive for chest pain.  Gastrointestinal: Positive for abdominal pain, diarrhea and abdominal distention. Negative for nausea and vomiting.  Endocrine: Negative for polydipsia, polyphagia and polyuria.  Genitourinary: Negative for dysuria, frequency and hematuria.  Musculoskeletal: Negative for gait  problem.  Skin: Negative for color change, pallor and rash.  Neurological: Negative for dizziness, syncope, light-headedness and headaches.  Hematological: Does not bruise/bleed easily.  Psychiatric/Behavioral: Negative for behavioral problems and confusion.    Allergies  Atorvastatin; Codeine; Iohexol; Morphine; and Rosuvastatin  Home Medications   Current Outpatient Rx  Name  Route  Sig  Dispense  Refill  . ALPRAZolam (XANAX) 0.25 MG tablet   Oral   Take 1 tablet (0.25 mg total) by mouth 2 (two) times daily as needed. for anxiety   30 tablet   2   . amiodarone (PACERONE) 100 MG tablet   Oral   Take 1 tablet (100 mg total) by mouth daily.   30 tablet   11   . aspirin 81 MG chewable tablet   Oral   Chew 81 mg by mouth daily.         Marland Kitchen atorvastatin (LIPITOR) 40 MG tablet   Oral   Take 40 mg by mouth every evening.         .  budesonide-formoterol (SYMBICORT) 160-4.5 MCG/ACT inhaler   Inhalation   Inhale 2 puffs into the lungs 2 (two) times daily.   1 Inhaler   12   . Cholecalciferol (VITAMIN D PO)   Oral   Take 1 capsule by mouth every evening.         . furosemide (LASIX) 40 MG tablet   Oral   Take 40 mg by mouth 2 (two) times daily.         Marland Kitchen glucosamine-chondroitin 500-400 MG tablet   Oral   Take 1 tablet by mouth 2 (two) times daily.         . insulin detemir (LEVEMIR) 100 UNIT/ML injection   Subcutaneous   Inject 20 Units into the skin every morning.         . insulin regular (NOVOLIN R,HUMULIN R) 100 units/mL injection   Subcutaneous   Inject 0-8 Units into the skin 3 (three) times daily before meals. Sliding scale         . Ipratropium-Albuterol (COMBIVENT) 20-100 MCG/ACT AERS respimat   Inhalation   Inhale 1 puff into the lungs every 6 (six) hours.   4 g   5   . isosorbide mononitrate (IMDUR) 60 MG 24 hr tablet   Oral   Take 1 tablet (60 mg total) by mouth daily.   30 tablet   12   . metoprolol succinate (TOPROL-XL) 25 MG 24 hr tablet   Oral   Take 0.5 tablets (12.5 mg total) by mouth daily.   15 tablet   12   . Multiple Vitamins-Minerals (PRESERVISION/LUTEIN) CAPS   Oral   Take 1 capsule by mouth daily.          Marland Kitchen NIFEdipine (PROCARDIA-XL/ADALAT CC) 30 MG 24 hr tablet   Oral   Take 30 mg by mouth daily.         . pantoprazole (PROTONIX) 40 MG tablet   Oral   Take 40 mg by mouth daily.         . potassium chloride SA (KLOR-CON M20) 20 MEQ tablet   Oral   Take 1 tablet (20 mEq total) by mouth daily.   30 tablet   12   . senna-docusate (SENOKOT-S) 8.6-50 MG per tablet   Oral   Take 1 tablet by mouth 2 (two) times daily.         . simethicone (MYLICON) 80 MG chewable tablet   Oral  Chew 80 mg by mouth at bedtime as needed for flatulence.         . vitamin C (ASCORBIC ACID) 500 MG tablet   Oral   Take 500 mg by mouth every evening.            . warfarin (COUMADIN) 5 MG tablet   Oral   Take 5 mg by mouth daily.         . Blood Glucose Monitoring Suppl (RELION ULTIMA GLUCOSE SYSTEM) W/DEVICE KIT   Other   1 kit by Other route 6 (six) times daily. Use to check blood sugars up to six times per day as directed.    Diagnosis:  250.00 Insulin treated diabetes with history of hypoglycemia.   1 kit   0    BP 147/40  Pulse 56  Temp(Src) 98.2 F (36.8 C) (Oral)  Resp 17  SpO2 100% Physical Exam  Constitutional: She is oriented to person, place, and time. She appears well-developed and well-nourished. No distress.  Anxious  HENT:  Head: Normocephalic.  Eyes: Conjunctivae are normal. Pupils are equal, round, and reactive to light. No scleral icterus.    Neck: Normal range of motion. Neck supple. No thyromegaly present.  Cardiovascular: Normal rate and regular rhythm.  Exam reveals no gallop and no friction rub.   No murmur heard. Pulmonary/Chest: Effort normal and breath sounds normal. No respiratory distress. She has no wheezes. She has no rales.  Abdominal: Soft. Bowel sounds are normal. She exhibits no distension. There is no tenderness. There is no rebound.  Nontender nondistended.   rectal no stool  Musculoskeletal: Normal range of motion.  Neurological: She is alert and oriented to person, place, and time.  Skin: Skin is warm and dry. No rash noted.  Psychiatric: She has a normal mood and affect. Her behavior is normal.    ED Course  EKG: Indication chest pain. Findings sinus bradycardia first-degree block PR interval 0.216 ventricular rate 58 right bundle-branch block unchanged versus comparison no acute ischemic changes Procedures (including critical care time)  Labs Reviewed  CBC - Abnormal; Notable for the following:    RBC 3.40 (*)    Hemoglobin 10.2 (*)    HCT 31.5 (*)    All other components within normal limits  BASIC METABOLIC PANEL - Abnormal; Notable for the following:    Glucose, Bld 170 (*)     BUN 24 (*)    Creatinine, Ser 1.27 (*)    GFR calc non Af Amer 38 (*)    GFR calc Af Amer 44 (*)    All other components within normal limits  PRO B NATRIURETIC PEPTIDE - Abnormal; Notable for the following:    Pro B Natriuretic peptide (BNP) 1464.0 (*)    All other components within normal limits  POCT I-STAT TROPONIN I   Dg Chest 2 View  02/10/2013   *RADIOLOGY REPORT*  Clinical Data: Chest pain, shortness of breath  CHEST - 2 VIEW  Comparison: 12/13/2012  Findings: Cardiomegaly again noted.  Atherosclerotic calcifications of the thoracic aorta again noted.  Chronic mild interstitial prominence.  No convincing pulmonary edema.  No segmental infiltrate.  Stable degenerative changes thoracic spine.  IMPRESSION: Cardiomegaly.  Chronic interstitial prompts without convincing pulmonary edema.  No segmental infiltrate.   Original Report Authenticated By: Natasha Mead, M.D.   Dg Abd 1 View  02/10/2013   *RADIOLOGY REPORT*  Clinical Data: Abdominal pain with nausea and vomiting  ABDOMEN - 1 VIEW  Comparison: CT abdomen  pelvis July 10, 2011  Findings:  The bowel gas pattern is normal.  No obstruction or free air is seen on this supine examination.  There is arthropathy in the lumbar spine.  There are foci of vascular calcification.  IMPRESSION: Bowel gas pattern unremarkable.   Original Report Authenticated By: Bretta Bang, M.D.   1. Constipation   2. Chest pain   3. Shortness of breath     MDM  Crit is at its baseline. He will listen 0.2 PO2 troponin 0.04 chest x-ray no acute abnormalities abdominal x-ray did notshow a  predominance of stool. She  was constipatedand over did it with over-the-counter laxatives had diarrhea.  She has had no additional soft stools here.  Plan: primary care followup, recheck of any other abnormalities  Claudean Kinds, MD 02/10/13 1730

## 2013-02-10 NOTE — ED Notes (Signed)
Pt and family reports mid intermittent chest pains x 3 days and having sob, has been taking nitro at home with some relief. States that the pain feels like static electricity in her chest and pt has mild swelling to her ankles. Having constipation for past several days also. ekg done at triage.

## 2013-02-10 NOTE — ED Notes (Signed)
Pt reports generalized weakness x months. States her home health nurse is aware, and is being tx by PCP for same. Pt reports nausea in the AM x 4 days.

## 2013-02-10 NOTE — ED Notes (Signed)
Hooked pt back up to the monitor.

## 2013-02-14 ENCOUNTER — Telehealth: Payer: Self-pay | Admitting: Pulmonary Disease

## 2013-02-14 NOTE — Telephone Encounter (Signed)
I spoke with pt. She she c/o increase SOB. Started last night. She was not able to come in today. I scheduled her to see tomorrow at 11:45 for acute. She needed nothing further

## 2013-02-15 ENCOUNTER — Ambulatory Visit (INDEPENDENT_AMBULATORY_CARE_PROVIDER_SITE_OTHER): Payer: Medicare Other | Admitting: Adult Health

## 2013-02-15 ENCOUNTER — Encounter: Payer: Self-pay | Admitting: Adult Health

## 2013-02-15 VITALS — BP 124/82 | HR 75 | Temp 97.0°F | Ht 61.0 in | Wt 158.0 lb

## 2013-02-15 DIAGNOSIS — J439 Emphysema, unspecified: Secondary | ICD-10-CM

## 2013-02-15 DIAGNOSIS — J438 Other emphysema: Secondary | ICD-10-CM

## 2013-02-15 MED ORDER — FUROSEMIDE 40 MG PO TABS: 40.0000 mg | ORAL_TABLET | Freq: Every day | ORAL | Status: DC | PRN

## 2013-02-15 NOTE — Progress Notes (Signed)
  Subjective:    Patient ID: [name], female    DOB: [birth year], 79 y.o.   MRN: [id number]  HPI 79 yo female former smoker with GOLD 4 COPD, and chronic respiratory failure with hypoxia.  >> hospital for CHF exacerbation from 07/29/12 to 07/31/12. >> tudorza, and symbicort.  She use 2.5 liters oxygen with exertion and nightly with sleep.  Tests: PFT 06/08/10>>FEV1 0.93(62%), FEV1% 60, TLC 2.84(69%), DLCO 54%, +BD Echo 08/23/11>>mild LVH, EF 55 to 60%, mild AS, mild MR, mod/severe LA dilation ONO with 2.5 liters 05/07/12>>Test time 3 hrs 47 min. Mean SpO2 99%, low SpO2 96%. Echo 07/21/12 >> EF 55 to 60%, grade 2 diastolic dysfx, mild/mod MR, mild LA dilation  02/15/2013 Acute OV  Complains of  increased SOB at rest and with exertion x 1 month, wheezing, nonrpod cough, and runny nose with thick, clear mucus.  No fever, hemoptysis, exertional chest pain, or vomitting or diarrhea.  Was in ER 8/17 for atypical chest pain and constipation.  CXR w/ chronic changes, BNP elevated ~1400, neg troponin.  Has had more leg swelling. On/off orthopnea .  No change in weight.  On lasix 40mg  Twice daily  .  Has follow up ov with cardiology next wek.  Of note is on Amiodarone.    Review of Systems Constitutional:   No  weight loss, night sweats,  Fevers, chills,  +fatigue, or  lassitude.  HEENT:   No headaches,  Difficulty swallowing,  Tooth/dental problems, or  Sore throat,                No sneezing, itching, ear ache,  +nasal congestion, post nasal drip,   CV:  No chest pain,  , anasarca, dizziness, palpitations, syncope.   GI  No heartburn, indigestion, abdominal pain, nausea, vomiting, diarrhea,loss of appetite, bloody stools.   Resp: No coughing up of blood.  No change in color of mucus.   No chest wall deformity  Skin: no rash or lesions.  GU: no dysuria, change in color of urine, no urgency or frequency.  No flank pain, no hematuria   MS:    No decreased range of motion.  No back  pain.  Psych:  No change in mood or affect. No depression or anxiety.  No memory loss.         Objective:   Physical Exam  GEN: A/Ox3; pleasant , NAD, elderly   HEENT:  [place]/AT,  EACs-clear, TMs-wnl, NOSE-clear, THROAT-clear, no lesions, no postnasal drip or exudate noted.   NECK:  Supple w/ fair ROM; no JVD; normal carotid impulses w/o bruits; no thyromegaly or nodules palpated; no lymphadenopathy.  RESP  Diminished  BS in bases no accessory muscle use, no dullness to percussion  CARD:  RRR, no m/r/g  , 1+ peripheral edema, pulses intact, no cyanosis or clubbing.  GI:   Soft & nt; nml bowel sounds; no organomegaly or masses detected.  Musco: Warm bil, no deformities or joint swelling noted.   Neuro: alert, no focal deficits noted.    Skin: Warm, no lesions or rashes        Assessment & Plan:

## 2013-02-15 NOTE — Patient Instructions (Addendum)
Take extra Lasix 40mg  daily for next 3 days then go back to 40mg  Twice daily   Low salt diet  Increase Symbicort 2 puffs Twice daily   Follow up with Dr. Allyson Sabal next week.  Follow up Dr. Craige Cotta  4 weeks and As needed   Please contact office for sooner follow up if symptoms do not improve or worsen or seek emergency care

## 2013-02-19 NOTE — Assessment & Plan Note (Signed)
Flare with mild volume overload   Plan Take extra Lasix 40mg  daily for next 3 days then go back to 40mg  Twice daily   Low salt diet  Increase Symbicort 2 puffs Twice daily   Follow up with Dr. Allyson Sabal next week.  Follow up Dr. Craige Cotta  4 weeks and As needed   Please contact office for sooner follow up if symptoms do not improve or worsen or seek emergency care

## 2013-02-21 ENCOUNTER — Ambulatory Visit (INDEPENDENT_AMBULATORY_CARE_PROVIDER_SITE_OTHER): Payer: Medicare Other | Admitting: Family Medicine

## 2013-02-21 ENCOUNTER — Encounter: Payer: Self-pay | Admitting: Family Medicine

## 2013-02-21 ENCOUNTER — Ambulatory Visit (INDEPENDENT_AMBULATORY_CARE_PROVIDER_SITE_OTHER): Payer: Medicare Other

## 2013-02-21 VITALS — BP 99/57 | HR 76 | Temp 98.9°F

## 2013-02-21 DIAGNOSIS — R0602 Shortness of breath: Secondary | ICD-10-CM

## 2013-02-21 DIAGNOSIS — J441 Chronic obstructive pulmonary disease with (acute) exacerbation: Secondary | ICD-10-CM

## 2013-02-21 MED ORDER — AZITHROMYCIN 250 MG PO TABS: ORAL_TABLET | ORAL | Status: DC

## 2013-02-21 NOTE — Progress Notes (Signed)
  Subjective:    Patient ID: [name], female    DOB: 25-Nov-1927, 79 y.o.   MRN: [id number]  HPI This 79 y.o. female presents for evaluation of shortness of breath.  She has hx of CHF Hypoxia and COPD.  She is not wearing her oxygen.  She was seen on 02/15/13 by Pulmonary and she was stable.  She has been placed on xtra lasix for edema and she States she has diuresed over 6 pounds since last night.   Review of Systems C/o SOB No chest pain,  HA, dizziness, vision change, N/V, diarrhea, constipation, dysuria, urinary urgency or frequency, myalgias, arthralgias or rash.     Objective:   Physical Exam Vital signs noted  Chronically ill wheelchair bound female in NAD.  HEENT - Head atraumatic Normocephalic                Eyes - PERRLA, Conjuctiva - clear Sclera- Clear EOMI                Ears - EAC's Wnl TM's Wnl Gross Hearing WNL                Throat - oropharanx wnl Respiratory - Lungs diminished throughout Oxygen saturation 88% on room air and when placed on 2 liters oxygen comes up to 98%. Cardiac - RRR S1 and S2 without murmur GI - Abdomen soft Nontender and bowel sounds active x 4 Extremities - No edema. Neuro - Grossly intact.      CXR - COPD without infiltrates Assessment & Plan:  SOB (shortness of breath) - Plan: DG Chest 2 View, azithromycin (ZITHROMAX) 250 MG tablet And continue to wear oxygen 2 1/2 liters [place].   Discussed follow up if not better.  COPD exacerbation - Plan: azithromycin (ZITHROMAX) 250 MG tablet, and continue neb tx's and  encouraged to wear oxygen.  Follow up PRN.

## 2013-02-21 NOTE — Patient Instructions (Signed)

## 2013-02-23 ENCOUNTER — Other Ambulatory Visit: Payer: Self-pay | Admitting: Cardiovascular Disease

## 2013-02-26 ENCOUNTER — Telehealth: Payer: Self-pay | Admitting: Cardiovascular Disease

## 2013-02-26 NOTE — Telephone Encounter (Signed)
Please call-having a lot of problems.Wants to also talk to you about being admitted to the hospital.

## 2013-02-26 NOTE — Telephone Encounter (Signed)
Rx has already been taken care of.

## 2013-02-26 NOTE — Telephone Encounter (Signed)
Returned patient's phone call. Asked how i could help her. Patient states that she has already been helped. She has made a appointment to see Dr. Allyson Sabal on Friday and will discuss her condition with him at that time. She thanked me for returning her call.

## 2013-02-27 ENCOUNTER — Other Ambulatory Visit: Payer: Self-pay | Admitting: Cardiovascular Disease

## 2013-02-28 ENCOUNTER — Other Ambulatory Visit: Payer: Self-pay

## 2013-02-28 NOTE — Telephone Encounter (Signed)
Last seen 02/21/13  B [place]  This med was not on EPIC list

## 2013-02-28 NOTE — Telephone Encounter (Signed)
Called to verify Procardia. Rx was sent to pharmacy electronically.

## 2013-03-01 ENCOUNTER — Ambulatory Visit (HOSPITAL_COMMUNITY)
Admission: RE | Admit: 2013-03-01 | Discharge: 2013-03-01 | Disposition: A | Discharge status: Home / Self Care | Payer: Medicare Other | Source: Ambulatory Visit | Attending: Cardiovascular Disease | Admitting: Cardiovascular Disease

## 2013-03-01 ENCOUNTER — Ambulatory Visit: Payer: Medicare Other | Admitting: Cardiovascular Disease

## 2013-03-01 DIAGNOSIS — I10 Essential (primary) hypertension: Secondary | ICD-10-CM | POA: Insufficient documentation

## 2013-03-01 DIAGNOSIS — I6529 Occlusion and stenosis of unspecified carotid artery: Secondary | ICD-10-CM

## 2013-03-01 DIAGNOSIS — I2510 Atherosclerotic heart disease of native coronary artery without angina pectoris: Secondary | ICD-10-CM | POA: Insufficient documentation

## 2013-03-01 DIAGNOSIS — R0989 Other specified symptoms and signs involving the circulatory and respiratory systems: Secondary | ICD-10-CM

## 2013-03-01 DIAGNOSIS — I672 Cerebral atherosclerosis: Secondary | ICD-10-CM | POA: Insufficient documentation

## 2013-03-01 MED ORDER — NITROGLYCERIN 0.4 MG SL SUBL: 0.4000 mg | SUBLINGUAL_TABLET | SUBLINGUAL | Status: DC | PRN

## 2013-03-01 NOTE — Progress Notes (Signed)
Carotid Duplex Completed. Corrion Stirewalt, BS, RDMS, RVT  

## 2013-03-04 ENCOUNTER — Ambulatory Visit (INDEPENDENT_AMBULATORY_CARE_PROVIDER_SITE_OTHER): Payer: Medicare Other | Admitting: Ophthalmology

## 2013-03-07 ENCOUNTER — Telehealth: Payer: Self-pay | Admitting: Physician Assistant

## 2013-03-07 NOTE — Telephone Encounter (Signed)
Patient is requesting results of carotid doppler.

## 2013-03-08 ENCOUNTER — Telehealth (HOSPITAL_COMMUNITY): Payer: Self-pay | Admitting: Cardiovascular Disease

## 2013-03-08 NOTE — Telephone Encounter (Signed)
Pt would like her carotid doppler results. Please call

## 2013-03-08 NOTE — Telephone Encounter (Signed)
Prelim results given to patient

## 2013-03-08 NOTE — Telephone Encounter (Signed)
Returned call and spoke w/ pt.  Informed results have not been reviewed by MD/PA and nurse will call, mail letter or release in MyChart after they are reviewed.  Pt verbalized understanding and agreed w/ plan.  

## 2013-03-11 LAB — POCT INR: INR: 1.99

## 2013-03-13 ENCOUNTER — Ambulatory Visit: Payer: Medicare Other | Admitting: Pharmacist

## 2013-03-13 ENCOUNTER — Telehealth: Payer: Self-pay | Admitting: Pharmacist

## 2013-03-13 DIAGNOSIS — Z7901 Long term (current) use of anticoagulants: Secondary | ICD-10-CM

## 2013-03-13 NOTE — Progress Notes (Signed)
No charge - INR was drawn in patient's home by Guy Sandifer.  I called set up appt to see [place] since Okey Regal in unable to go out any more.

## 2013-03-13 NOTE — Telephone Encounter (Signed)
Patient contacted about INR results and appt set up for 1 month to recheck INR.

## 2013-03-14 ENCOUNTER — Telehealth: Payer: Self-pay | Admitting: Family Medicine

## 2013-03-14 ENCOUNTER — Ambulatory Visit (INDEPENDENT_AMBULATORY_CARE_PROVIDER_SITE_OTHER): Payer: Self-pay | Admitting: Ophthalmology

## 2013-03-14 NOTE — Telephone Encounter (Signed)
Late entry.  I called pt last night.  She needed to change PCPs due to travel concerns.  It has been a privilege to be her MD and I wish her the best.  She thanked me for my call.

## 2013-03-19 ENCOUNTER — Encounter: Payer: Self-pay | Admitting: *Deleted

## 2013-03-19 ENCOUNTER — Telehealth: Payer: Self-pay | Admitting: *Deleted

## 2013-03-19 DIAGNOSIS — I6529 Occlusion and stenosis of unspecified carotid artery: Secondary | ICD-10-CM

## 2013-03-19 NOTE — Telephone Encounter (Signed)
Message copied by Marella Bile on Tue Mar 19, 2013  8:43 AM ------      Message from: Runell Gess      Created: Sun Mar 17, 2013 11:09 AM       No change from prior study. Repeat in 6 months ------

## 2013-03-19 NOTE — Telephone Encounter (Signed)
Order placed for repeat carotid dopplers in 6 months  

## 2013-03-25 ENCOUNTER — Telehealth: Payer: Self-pay | Admitting: *Deleted

## 2013-03-25 NOTE — Telephone Encounter (Addendum)
Message copied by Baltazar Apo on Mon Mar 25, 2013  8:52 AM ------      Message from: [name]      Created: Fri Feb 22, 2013  8:32 AM       Cxr results show copd and enlarged heart but no chf. ------PT NOTIFIED

## 2013-04-01 ENCOUNTER — Encounter: Payer: Self-pay | Admitting: Family Medicine

## 2013-04-01 ENCOUNTER — Ambulatory Visit (INDEPENDENT_AMBULATORY_CARE_PROVIDER_SITE_OTHER): Payer: Medicare Other | Admitting: Family Medicine

## 2013-04-01 VITALS — BP 130/60 | HR 68 | Temp 97.3°F | Ht 59.5 in | Wt 154.0 lb

## 2013-04-01 DIAGNOSIS — I480 Paroxysmal atrial fibrillation: Secondary | ICD-10-CM

## 2013-04-01 DIAGNOSIS — L821 Other seborrheic keratosis: Secondary | ICD-10-CM

## 2013-04-01 DIAGNOSIS — I4891 Unspecified atrial fibrillation: Secondary | ICD-10-CM

## 2013-04-01 DIAGNOSIS — Z7901 Long term (current) use of anticoagulants: Secondary | ICD-10-CM

## 2013-04-01 DIAGNOSIS — I25810 Atherosclerosis of coronary artery bypass graft(s) without angina pectoris: Secondary | ICD-10-CM

## 2013-04-01 DIAGNOSIS — Z23 Encounter for immunization: Secondary | ICD-10-CM

## 2013-04-01 DIAGNOSIS — J449 Chronic obstructive pulmonary disease, unspecified: Secondary | ICD-10-CM

## 2013-04-01 NOTE — Patient Instructions (Addendum)

## 2013-04-01 NOTE — Progress Notes (Signed)
  Subjective:    Patient ID: [name], female    DOB: [birth year], 79 y.o.   MRN: [id number]  HPI This 79 y.o. female presents for evaluation of follow up appointment.  She states she Has some skin lesions on he r arms and she has some spots on her chin that popped up. She has hx of COPD and CAD.  She has been seeing Pulmonary and Cardiology.   Review of Systems     Objective:   Physical Exam Vital signs noted  Well developed well nourished female.  HEENT - Head atraumatic Normocephalic Respiratory - Lungs CTA bilateral Cardiac - RRR S1 and S2 without murmur GI - Abdomen soft Nontender and bowel sounds active x 4 Skin - SK lesions on forearms bilateral Neuro - Grossly intact.       Assessment & Plan:  COPD (chronic obstructive pulmonary disease) - Plan: Follow up with Pulmonary  CAD (coronary artery disease) of artery bypass graft - Plan: Follow up with Cardiology  PAF - Follow up for coumadin appointment with Tammy Eckerd Pharm D in a couple weeks  Skin lesions - Liquid nitrogen freeze on forearms bilateral.  Follow up in 3 months  Deatra Canter FNP

## 2013-04-05 ENCOUNTER — Ambulatory Visit: Payer: Medicare Other | Admitting: Pulmonary Disease

## 2013-04-15 ENCOUNTER — Other Ambulatory Visit: Payer: Self-pay | Admitting: Nurse Practitioner

## 2013-04-15 ENCOUNTER — Ambulatory Visit (INDEPENDENT_AMBULATORY_CARE_PROVIDER_SITE_OTHER): Payer: Medicare Other

## 2013-04-15 ENCOUNTER — Encounter: Payer: Self-pay | Admitting: Nurse Practitioner

## 2013-04-15 ENCOUNTER — Ambulatory Visit (INDEPENDENT_AMBULATORY_CARE_PROVIDER_SITE_OTHER): Payer: Medicare Other | Admitting: Nurse Practitioner

## 2013-04-15 ENCOUNTER — Telehealth: Payer: Self-pay | Admitting: Pharmacist

## 2013-04-15 VITALS — BP 109/53 | HR 50 | Temp 97.6°F | Ht 59.0 in | Wt 160.0 lb

## 2013-04-15 VITALS — BP 135/88 | Temp 97.7°F | Ht 62.0 in | Wt 156.0 lb

## 2013-04-15 DIAGNOSIS — S8990XA Unspecified injury of unspecified lower leg, initial encounter: Secondary | ICD-10-CM

## 2013-04-15 DIAGNOSIS — Z7901 Long term (current) use of anticoagulants: Secondary | ICD-10-CM

## 2013-04-15 DIAGNOSIS — S99921A Unspecified injury of right foot, initial encounter: Secondary | ICD-10-CM

## 2013-04-15 DIAGNOSIS — S90129A Contusion of unspecified lesser toe(s) without damage to nail, initial encounter: Secondary | ICD-10-CM

## 2013-04-15 DIAGNOSIS — S90121A Contusion of right lesser toe(s) without damage to nail, initial encounter: Secondary | ICD-10-CM

## 2013-04-15 LAB — POCT INR: INR: 3.4

## 2013-04-15 NOTE — Patient Instructions (Signed)
Contusion A contusion is a deep bruise. Contusions are the result of an injury that caused bleeding under the skin. The contusion may turn blue, purple, or yellow. Minor injuries will give you a painless contusion, but more severe contusions may stay painful and swollen for a few weeks.  CAUSES  A contusion is usually caused by a blow, trauma, or direct force to an area of the body. SYMPTOMS   Swelling and redness of the injured area.  Bruising of the injured area.  Tenderness and soreness of the injured area.  Pain. DIAGNOSIS  The diagnosis can be made by taking a history and physical exam. An X-ray, CT scan, or MRI may be needed to determine if there were any associated injuries, such as fractures. TREATMENT  Specific treatment will depend on what area of the body was injured. In general, the best treatment for a contusion is resting, icing, elevating, and applying cold compresses to the injured area. Over-the-counter medicines may also be recommended for pain control. Ask your caregiver what the best treatment is for your contusion. HOME CARE INSTRUCTIONS   Put ice on the injured area.  Put ice in a plastic bag.  Place a towel between your skin and the bag.  Leave the ice on for 15-20 minutes, 3-4 times a day.  Only take over-the-counter or prescription medicines for pain, discomfort, or fever as directed by your caregiver. Your caregiver may recommend avoiding anti-inflammatory medicines (aspirin, ibuprofen, and naproxen) for 48 hours because these medicines may increase bruising.  Rest the injured area.  If possible, elevate the injured area to reduce swelling. SEEK IMMEDIATE MEDICAL CARE IF:   You have increased bruising or swelling.  You have pain that is getting worse.  Your swelling or pain is not relieved with medicines. MAKE SURE YOU:   Understand these instructions.  Will watch your condition.  Will get help right away if you are not doing well or get  worse. Document Released: 03/23/2005 Document Revised: 09/05/2011 Document Reviewed: 04/18/2011 Exit[employer] Patient Information 2014 [employer].  

## 2013-04-15 NOTE — Telephone Encounter (Signed)
Fax sent to [facility] stating that patient needs to keep appt today for PT/INR.

## 2013-04-15 NOTE — Progress Notes (Signed)
  Subjective:    Patient ID: [name], female    DOB: 05/18/1928, 79 y.o.   MRN: [id number]  HPI patient stumped her right 4th toe on coffee table yesterday- Toe sore and red    Review of Systems  All other systems reviewed and are negative.       Objective:   Physical Exam  Constitutional: She appears well-developed and well-nourished.  Cardiovascular: Normal rate, regular rhythm and normal heart sounds.   Pulmonary/Chest: Effort normal and breath sounds normal.  Musculoskeletal:  Right 4th toe erythematous and edematous- sore to touch   Rt 4th toe xray0- no fracture visible-Preliminary reading by Paulene Floor, FNP  [facility]        Assessment & Plan:   1. Toe contusion, right, initial encounter    Soak in epsom salt BID Motrin OTC for pain RTO prn  Melinda Daphine Deutscher, FNP

## 2013-04-15 NOTE — Patient Instructions (Signed)
Anticoagulation Dose Instructions as of 04/15/2013     [name] Tue Wed Thu Fri Sat   New Dose 4 mg 4 mg 4 mg 4 mg 4 mg 2 mg 4 mg    Description       Hold warfarin today then decrease to 1/2 tablet on fridays and 1 tablet all other days       INR was 3.4 today (this is a little too thin)

## 2013-04-23 ENCOUNTER — Telehealth: Payer: Self-pay | Admitting: *Deleted

## 2013-04-23 NOTE — Telephone Encounter (Signed)
Left VM for patient to return call to clarify dosage and frequency of furosemide 40mg .

## 2013-04-30 ENCOUNTER — Ambulatory Visit (INDEPENDENT_AMBULATORY_CARE_PROVIDER_SITE_OTHER): Payer: Medicare Other | Admitting: Pharmacist Clinician (PhC)/ Clinical Pharmacy Specialist

## 2013-04-30 DIAGNOSIS — I6529 Occlusion and stenosis of unspecified carotid artery: Secondary | ICD-10-CM

## 2013-04-30 DIAGNOSIS — Z7901 Long term (current) use of anticoagulants: Secondary | ICD-10-CM

## 2013-04-30 LAB — POCT INR: INR: 1.7

## 2013-05-01 ENCOUNTER — Other Ambulatory Visit: Payer: Self-pay | Admitting: *Deleted

## 2013-05-01 MED ORDER — FUROSEMIDE 40 MG PO TABS: 40.0000 mg | ORAL_TABLET | Freq: Two times a day (BID) | ORAL | Status: DC

## 2013-05-01 NOTE — Telephone Encounter (Signed)
Rx was sent to pharmacy electronically. 

## 2013-05-14 ENCOUNTER — Other Ambulatory Visit: Payer: Self-pay | Admitting: *Deleted

## 2013-05-14 ENCOUNTER — Ambulatory Visit (INDEPENDENT_AMBULATORY_CARE_PROVIDER_SITE_OTHER): Payer: Medicare Other | Admitting: Pharmacist Clinician (PhC)/ Clinical Pharmacy Specialist

## 2013-05-14 DIAGNOSIS — Z7901 Long term (current) use of anticoagulants: Secondary | ICD-10-CM

## 2013-05-14 MED ORDER — POTASSIUM CHLORIDE CRYS ER 20 MEQ PO TBCR: 20.0000 meq | EXTENDED_RELEASE_TABLET | Freq: Every day | ORAL | Status: DC

## 2013-05-16 ENCOUNTER — Other Ambulatory Visit: Payer: Self-pay | Admitting: *Deleted

## 2013-05-16 MED ORDER — ATORVASTATIN CALCIUM 40 MG PO TABS: 40.0000 mg | ORAL_TABLET | Freq: Every evening | ORAL | Status: DC

## 2013-05-20 ENCOUNTER — Ambulatory Visit (INDEPENDENT_AMBULATORY_CARE_PROVIDER_SITE_OTHER)
Admission: RE | Admit: 2013-05-20 | Discharge: 2013-05-20 | Disposition: A | Discharge status: Home / Self Care | Payer: Medicare Other | Source: Ambulatory Visit | Attending: Pulmonary Disease | Admitting: Pulmonary Disease

## 2013-05-20 ENCOUNTER — Ambulatory Visit (INDEPENDENT_AMBULATORY_CARE_PROVIDER_SITE_OTHER): Payer: Medicare Other | Admitting: Pulmonary Disease

## 2013-05-20 ENCOUNTER — Encounter: Payer: Self-pay | Admitting: Pulmonary Disease

## 2013-05-20 VITALS — BP 120/82 | HR 64 | Ht 61.0 in | Wt 159.0 lb

## 2013-05-20 DIAGNOSIS — R0789 Other chest pain: Secondary | ICD-10-CM

## 2013-05-20 DIAGNOSIS — J9610 Chronic respiratory failure, unspecified whether with hypoxia or hypercapnia: Secondary | ICD-10-CM

## 2013-05-20 DIAGNOSIS — J438 Other emphysema: Secondary | ICD-10-CM

## 2013-05-20 DIAGNOSIS — J9611 Chronic respiratory failure with hypoxia: Secondary | ICD-10-CM

## 2013-05-20 DIAGNOSIS — R05 Cough: Secondary | ICD-10-CM

## 2013-05-20 DIAGNOSIS — J449 Chronic obstructive pulmonary disease, unspecified: Secondary | ICD-10-CM

## 2013-05-20 DIAGNOSIS — R0902 Hypoxemia: Secondary | ICD-10-CM

## 2013-05-20 DIAGNOSIS — J439 Emphysema, unspecified: Secondary | ICD-10-CM

## 2013-05-20 MED ORDER — FLUTICASONE PROPIONATE 50 MCG/ACT NA SUSP: 2.0000 | Freq: Every day | NASAL | Status: DC

## 2013-05-20 NOTE — Assessment & Plan Note (Signed)
She likely as post-nasal drip causing upper airway irritation and globus sensation.  Will have her continue nasal irrigation and try flonase.

## 2013-05-20 NOTE — Assessment & Plan Note (Addendum)
She is to continue 2.5 liters oxygen with exertion and sleep.   

## 2013-05-20 NOTE — Progress Notes (Signed)
Chief Complaint  Patient presents with  . COPD    Breathing has good days and bad days. Reports DOE, nosebleeds.    CC: [name]  History of Present Illness: [name] is a 79 y.o. female former smoker with GOLD 4 COPD, and chronic respiratory failure with hypoxia.  She has noticed trouble with sinus congestion and post-nasal drip for the past several weeks.  She feels like there is something stuck in her throat that she can't cough up.  She has been getting hoarse.  She denies sinus headache, or fever.  She has been getting episodes of nosebleeds, but not recently.  She is not having wheeze or hemoptysis.  She has noticed feeling pain in her right back/chest over the past few days >> this reminds her of the pain she had when she was treated for pneumonia.  She uses symbicort twice per day.  She has not needed to use combivent much.  She use 2.5 liters oxygen with exertion and nightly with sleep.  Tests: PFT 06/08/10>>FEV1 0.93(62%), FEV1% 60, TLC 2.84(69%), DLCO 54%, +BD Echo 08/23/11>>mild LVH, EF 55 to 60%, mild AS, mild MR, mod/severe LA dilation ONO with 2.5 liters 05/07/12>>Test time 3 hrs 47 min. Mean SpO2 99%, low SpO2 96%. Echo 07/21/12 >> EF 55 to 60%, grade 2 diastolic dysfx, mild/mod MR, mild LA dilation  She  has a past medical history of Hyperlipidemia; Hypertension; Anxiety; GERD (gastroesophageal reflux disease); CAD (coronary artery disease); Diastolic dysfunction; RBBB (right bundle branch block); Paroxysmal atrial fibrillation; Hypoxemic respiratory failure, chronic; COPD (chronic obstructive pulmonary disease); Secondary pulmonary hypertension; Edema; Angina; Shortness of breath; CHF (congestive heart failure); Diabetes mellitus; and Bradycardia (08/24/2011).  She  has past surgical history that includes Rotator cuff repair (07/2005); Abdominal hysterectomy; cardiac stents; Cardiac catheterization; and Coronary angioplasty.   Allergies  Allergen Reactions   . Atorvastatin Other (See Comments)     muscle aches  . Codeine Nausea And Vomiting  . Iohexol Other (See Comments)     Desc: unknown reaction; allergic to iodine and contrast   . Morphine Nausea And Vomiting  . Rosuvastatin Other (See Comments)    muscle aches at high doses, held as of 12/2010 due to aches    Physical Exam:  General - No distress HEENT - no sinus tenderness, no oral exudate, no LAN Cardiac - s1s2 Chest - no wheeze/rales Abdomen - soft, nontender Extremities - no edema Skin - no rashes Neurologic - normal strength Psychiatric - normal mood, behavior   Assessment/Plan:  [name], MD [facility] Pulmonary/Critical Care 05/20/2013, 10:03 AM Pager:  [phone] After 3pm call: [phone]

## 2013-05-20 NOTE — Patient Instructions (Signed)
Chest xray today >> will call with results Continue nasal irrigation (salt water spray for sinuses) Flonase two sprays each nostril daily Follow up in 6 months

## 2013-05-20 NOTE — Assessment & Plan Note (Signed)
Stable on current regimen of symbicort and prn combivent.  I gave her samples of symbicort.

## 2013-05-21 ENCOUNTER — Ambulatory Visit (INDEPENDENT_AMBULATORY_CARE_PROVIDER_SITE_OTHER): Payer: Medicare Other | Admitting: Family Medicine

## 2013-05-21 ENCOUNTER — Telehealth: Payer: Self-pay | Admitting: Pulmonary Disease

## 2013-05-21 VITALS — BP 117/68 | HR 58 | Temp 98.0°F | Ht 61.0 in | Wt 157.0 lb

## 2013-05-21 DIAGNOSIS — R040 Epistaxis: Secondary | ICD-10-CM

## 2013-05-21 DIAGNOSIS — Z7901 Long term (current) use of anticoagulants: Secondary | ICD-10-CM

## 2013-05-21 NOTE — Progress Notes (Signed)
Subjective:    Patient ID: [name], female    DOB: 05-Feb-1928, 79 y.o.   MRN: [id number]  HPI Pt here today for nosebleeds. Patient comes in to the visit today with her daughter-in-law. Her recent Coumadin was increased because of a low INR. She was also seen by a specialist and started on fluticasone nose spray. She awoke this morning with bleeding from the left nostril. The bleeding has now stopped.     Patient Active Problem List   Diagnosis Date Noted  . Upper airway cough syndrome 05/20/2013  . Pain in joint, ankle and foot 12/06/2012  . Olecranon bursitis of left elbow 08/26/2012  . Scalp lesion 08/26/2012  . Counseling regarding advanced directives 08/26/2012  . DM type 2 causing CKD stage 3 07/30/2012  . Sacral fracture, closed 07/22/2012  . Ataxia 07/20/2012  . Weakness of both legs 07/20/2012  . Falls frequently 07/20/2012  . RBBB 08/25/2011  . CAD, remote RCA PCI, subsequently noted to be occluded, last cath 8/11 08/25/2011  . PVD (peripheral vascular disease), moderate carotid and LSCA disease 08/25/2011  . Chronic anticoagulation, on coumadin 08/25/2011  . HTN (hypertension) 08/22/2011  . AK (actinic keratosis) 07/17/2011  . Shoulder pain 04/03/2011  . Gait instability 10/11/2010  . PAF, recurrance this admission, on chronic Amio/ Coumadin 07/06/2010  . COPD with emphysema 06/16/2010  . Chronic respiratory failure with hypoxia 06/07/2010  . HYPERLIPIDEMIA, statin intol 02/11/2010   Outpatient Encounter Prescriptions as of 05/21/2013  Medication Sig  . ALPRAZolam (XANAX) 0.25 MG tablet Take 1 tablet (0.25 mg total) by mouth 2 (two) times daily as needed. for anxiety  . amiodarone (PACERONE) 100 MG tablet Take 1 tablet (100 mg total) by mouth daily.  Marland Kitchen aspirin 81 MG chewable tablet Chew 81 mg by mouth daily.  Marland Kitchen atorvastatin (LIPITOR) 40 MG tablet Take 1 tablet (40 mg total) by mouth every evening.  . Blood Glucose Monitoring Suppl (RELION ULTIMA GLUCOSE  SYSTEM) W/DEVICE KIT 1 kit by Other route 6 (six) times daily. Use to check blood sugars up to six times per day as directed.    Diagnosis:  250.00 Insulin treated diabetes with history of hypoglycemia.  . budesonide-formoterol (SYMBICORT) 160-4.5 MCG/ACT inhaler Inhale 1 puff into the lungs 2 (two) times daily.  . Cholecalciferol (VITAMIN D PO) Take 1 capsule by mouth every evening.  . fluticasone (FLONASE) 50 MCG/ACT nasal spray Place 2 sprays into both nostrils daily.  . furosemide (LASIX) 40 MG tablet Take 40 mg by mouth 2 (two) times daily.  . furosemide (LASIX) 40 MG tablet Take 1 tablet (40 mg total) by mouth 2 (two) times daily.  Marland Kitchen glucosamine-chondroitin 500-400 MG tablet Take 1 tablet by mouth 2 (two) times daily.  . insulin detemir (LEVEMIR) 100 UNIT/ML injection Inject 20 Units into the skin every morning.  . insulin regular (NOVOLIN R,HUMULIN R) 100 units/mL injection Inject 0-8 Units into the skin 3 (three) times daily before meals. Sliding scale  . Ipratropium-Albuterol (COMBIVENT) 20-100 MCG/ACT AERS respimat Inhale 1 puff into the lungs every 6 (six) hours as needed.  . isosorbide mononitrate (IMDUR) 60 MG 24 hr tablet Take 1 tablet (60 mg total) by mouth daily.  . metoprolol succinate (TOPROL-XL) 25 MG 24 hr tablet Take 0.5 tablets (12.5 mg total) by mouth daily.  . Multiple Vitamins-Minerals (PRESERVISION/LUTEIN) CAPS Take 1 capsule by mouth daily.   Marland Kitchen NIFEdipine (PROCARDIA-XL/ADALAT-CC/NIFEDICAL-XL) 30 MG 24 hr tablet TAKE (1) CAPSULE DAILY  . potassium chloride SA (KLOR-CON M20)  20 MEQ tablet Take 1 tablet (20 mEq total) by mouth daily.  Marland Kitchen senna-docusate (SENOKOT-S) 8.6-50 MG per tablet Take 1 tablet by mouth 2 (two) times daily.  . simethicone (MYLICON) 80 MG chewable tablet Chew 80 mg by mouth at bedtime as needed for flatulence.  . vitamin C (ASCORBIC ACID) 500 MG tablet Take 500 mg by mouth every evening.   . warfarin (COUMADIN) 4 MG tablet daily. As directed by the  [facility]  . nitroGLYCERIN (NITROSTAT) 0.4 MG SL tablet Place 1 tablet (0.4 mg total) under the tongue every 5 (five) minutes as needed for chest pain.  . pantoprazole (PROTONIX) 40 MG tablet Take 40 mg by mouth daily.    Review of Systems  Constitutional: Positive for appetite change.  HENT: Positive for congestion. Negative for ear pain.   Eyes: Negative.   Respiratory: Negative for cough.   Cardiovascular: Negative.   Gastrointestinal: Negative.   Endocrine: Negative.   Genitourinary: Negative.   Musculoskeletal: Positive for back pain.  Allergic/Immunologic: Negative.   Neurological: Negative for headaches.  Hematological: Bruises/bleeds easily.  Psychiatric/Behavioral: Negative.        Objective:   Physical Exam  Nursing note and vitals reviewed. Constitutional: She is oriented to person, place, and time. She appears well-developed and well-nourished. No distress.  HENT:  Head: Normocephalic.  Nose: Nose normal.  Slight redness and irritation left nasal septum. No sign of a bleeding from nostril down into her throat  Eyes: Conjunctivae are normal. Right eye exhibits no discharge. Left eye exhibits no discharge. No scleral icterus.  Neck: Normal range of motion.  Musculoskeletal: Normal range of motion.  Neurological: She is alert and oriented to person, place, and time.  Skin: Skin is warm.  Psychiatric: She has a normal mood and affect. Her behavior is normal. Judgment and thought content normal.   BP 117/68  Pulse 58  Temp(Src) 98 F (36.7 C) (Oral)  Ht 5\' 1"  (1.549 m)  Wt 157 lb (71.215 kg)  BMI 29.68 kg/m2        Assessment & Plan:   1. Epistaxis   2. Chronic anticoagulation    Patient Instructions                   Nosebleed Nosebleeds can be caused by many conditions including trauma, infections, polyps, foreign bodies, dry mucous membranes or climate, medications and air conditioning. Most nosebleeds occur in the front of the  nose. It is because of this location that most nosebleeds can be controlled by pinching the nostrils gently and continuously. Do this for at least 10 to 20 minutes. The reason for this long continuous pressure is that you must hold it long enough for the blood to clot. If during that 10 to 20 minute time period, pressure is released, the process may have to be started again. The nosebleed may stop by itself, quit with pressure, need concentrated heating (cautery) or stop with pressure from packing. HOME CARE INSTRUCTIONS   If your nose was packed, try to maintain the pack inside until your caregiver removes it. If a gauze pack was used and it starts to fall out, gently replace or cut the end off. Do not cut if a balloon catheter was used to pack the nose. Otherwise, do not remove unless instructed.  Avoid blowing your nose for 12 hours after treatment. This could dislodge the pack or clot and start bleeding again.  If the bleeding starts again, sit up and bending forward, gently  pinch the front half of your nose continuously for 20 minutes.  If bleeding was caused by dry mucous membranes, cover the inside of your nose every morning with a petroleum or antibiotic ointment. Use your little fingertip as an applicator. Do this as needed during dry weather. This will keep the mucous membranes moist and allow them to heal.  Maintain humidity in your home by using less air conditioning or using a humidifier.  Do not use aspirin or medications which make bleeding more likely. Your caregiver can give you recommendations on this.  Resume normal activities as able but try to avoid straining, lifting or bending at the waist for several days.  If the nosebleeds become recurrent and the cause is unknown, your caregiver may suggest laboratory tests. SEEK IMMEDIATE MEDICAL CARE IF:   Bleeding recurs and cannot be controlled.  There is unusual bleeding from or bruising on other parts of the body.  You have a  fever.  Nosebleeds continue.  There is any worsening of the condition which originally brought you in.  You become lightheaded, feel faint, become sweaty or vomit blood. MAKE SURE YOU:   Understand these instructions.  Will watch your condition.  Will get help right away if you are not doing well or get worse. Document Released: 03/23/2005 Document Revised: 09/05/2011 Document Reviewed: 05/15/2009 [facility] Patient Information 2014 [place], [place].   Hold the Coumadin tomorrow and restart the following day at the recommended dosage prescribed by the clinical pharmacist Use a cool mist humidifier in her bedroom at nighttime Use the saline gel in your nose nightly Drink plenty of fluids If a bleed recurs pinch her nose together for 5 minutes with the thumb and index finger Wait and restart the fluticasone nose spray in one week, then restarting it , just use one spray each nostril once daily, always direct the spray to the ear     Nyra Capes MD

## 2013-05-21 NOTE — Telephone Encounter (Signed)
lmtcb x1 

## 2013-05-21 NOTE — Patient Instructions (Addendum)
                  Nosebleed Nosebleeds can be caused by many conditions including trauma, infections, polyps, foreign bodies, dry mucous membranes or climate, medications and air conditioning. Most nosebleeds occur in the front of the nose. It is because of this location that most nosebleeds can be controlled by pinching the nostrils gently and continuously. Do this for at least 10 to 20 minutes. The reason for this long continuous pressure is that you must hold it long enough for the blood to clot. If during that 10 to 20 minute time period, pressure is released, the process may have to be started again. The nosebleed may stop by itself, quit with pressure, need concentrated heating (cautery) or stop with pressure from packing. HOME CARE INSTRUCTIONS   If your nose was packed, try to maintain the pack inside until your caregiver removes it. If a gauze pack was used and it starts to fall out, gently replace or cut the end off. Do not cut if a balloon catheter was used to pack the nose. Otherwise, do not remove unless instructed.  Avoid blowing your nose for 12 hours after treatment. This could dislodge the pack or clot and start bleeding again.  If the bleeding starts again, sit up and bending forward, gently pinch the front half of your nose continuously for 20 minutes.  If bleeding was caused by dry mucous membranes, cover the inside of your nose every morning with a petroleum or antibiotic ointment. Use your little fingertip as an applicator. Do this as needed during dry weather. This will keep the mucous membranes moist and allow them to heal.  Maintain humidity in your home by using less air conditioning or using a humidifier.  Do not use aspirin or medications which make bleeding more likely. Your caregiver can give you recommendations on this.  Resume normal activities as able but try to avoid straining, lifting or bending at the waist for several days.  If the nosebleeds  become recurrent and the cause is unknown, your caregiver may suggest laboratory tests. SEEK IMMEDIATE MEDICAL CARE IF:   Bleeding recurs and cannot be controlled.  There is unusual bleeding from or bruising on other parts of the body.  You have a fever.  Nosebleeds continue.  There is any worsening of the condition which originally brought you in.  You become lightheaded, feel faint, become sweaty or vomit blood. MAKE SURE YOU:   Understand these instructions.  Will watch your condition.  Will get help right away if you are not doing well or get worse. Document Released: 03/23/2005 Document Revised: 09/05/2011 Document Reviewed: 05/15/2009 [facility] Patient Information 2014 [place], [place].   Hold the Coumadin tomorrow and restart the following day at the recommended dosage prescribed by the clinical pharmacist Use a cool mist humidifier in her bedroom at nighttime Use the saline gel in your nose nightly Drink plenty of fluids If a bleed recurs pinch her nose together for 5 minutes with the thumb and index finger Wait and restart the fluticasone nose spray in one week, then restarting it , just use one spray each nostril once daily, always direct the spray to the ear

## 2013-05-21 NOTE — Telephone Encounter (Signed)
05/20/2013    CLINICAL DATA:  Followup COPD.  Smoker.   EXAM: CHEST  2 VIEW   COMPARISON:  01/2013   FINDINGS:  Cardiac silhouette is mildly enlarged. The aorta is mildly uncoiled. The pulmonary arteries are prominent, which is stable. No mediastinal or hilar masses are evident.  The lungs are mildly hyperexpanded. Minor scarring is noted along the minor fissure. Prominent lung markings are noted in the bases and a relative paucity of markings are noted in the upper lobes suggesting emphysema. No pulmonary mass or nodule. No infiltrate or edema. No pleural effusion or pneumothorax. The bony thorax is diffusely demineralized but intact.   IMPRESSION:  No acute cardiopulmonary disease.  Findings suggests COPD    Electronically Signed   By: Amie Portland M.D.   On: 05/20/2013 12:44    Will have my nurse inform pt that CXR shows expected changes of COPD.  No evidence for pneumonia.  No change to current treatment plan.

## 2013-05-22 NOTE — Telephone Encounter (Signed)
Pt is aware of results. 

## 2013-05-29 ENCOUNTER — Ambulatory Visit (INDEPENDENT_AMBULATORY_CARE_PROVIDER_SITE_OTHER): Payer: Medicare Other | Admitting: Pharmacist

## 2013-05-29 DIAGNOSIS — Z7901 Long term (current) use of anticoagulants: Secondary | ICD-10-CM

## 2013-05-29 LAB — POCT INR: INR: 2.7

## 2013-05-29 NOTE — Patient Instructions (Signed)
Anticoagulation Dose Instructions as of 05/29/2013     [name] Tue Wed Thu Fri Sat   New Dose 4 mg 6 mg 4 mg 4 mg 4 mg 6 mg 4 mg    Description       Take 1 tablet daily except 1 and 1/2 tablets on Mondays and Fridays      INR was 2.7 today (goal is 2.0 to 3.0)

## 2013-06-04 ENCOUNTER — Other Ambulatory Visit: Payer: Self-pay | Admitting: Family Medicine

## 2013-06-04 NOTE — Telephone Encounter (Signed)
Last filled 03/04/2013

## 2013-06-05 MED ORDER — ALPRAZOLAM 0.25 MG PO TABS: 0.2500 mg | ORAL_TABLET | Freq: Two times a day (BID) | ORAL | Status: DC | PRN

## 2013-06-05 NOTE — Telephone Encounter (Signed)
Called to [facility]  

## 2013-06-05 NOTE — Telephone Encounter (Signed)
Please call in this one time.  She was going to get set up with new MD and this should tide her over.  Thanks.

## 2013-06-05 NOTE — Telephone Encounter (Signed)
Called and spoke to patient and was advised that she has changed to Dr. Christell Constant. Patient stated that this was sent to Dr. Para March in error. Called and spoke to [place] at the pharmacy and was advised that they have already sent the request to Dr. Christell Constant and to disregard the refill request.

## 2013-06-07 ENCOUNTER — Telehealth: Payer: Self-pay | Admitting: Nurse Practitioner

## 2013-06-12 ENCOUNTER — Ambulatory Visit (INDEPENDENT_AMBULATORY_CARE_PROVIDER_SITE_OTHER): Payer: Medicare Other | Admitting: Pharmacist

## 2013-06-12 ENCOUNTER — Encounter: Payer: Self-pay | Admitting: Pharmacist

## 2013-06-12 VITALS — BP 128/75 | HR 77 | Ht 61.0 in | Wt 159.0 lb

## 2013-06-12 DIAGNOSIS — E1122 Type 2 diabetes mellitus with diabetic chronic kidney disease: Secondary | ICD-10-CM

## 2013-06-12 DIAGNOSIS — I4891 Unspecified atrial fibrillation: Secondary | ICD-10-CM

## 2013-06-12 DIAGNOSIS — Z7901 Long term (current) use of anticoagulants: Secondary | ICD-10-CM

## 2013-06-12 DIAGNOSIS — E1129 Type 2 diabetes mellitus with other diabetic kidney complication: Secondary | ICD-10-CM

## 2013-06-12 LAB — POCT INR: INR: 2.8

## 2013-06-12 NOTE — Patient Instructions (Signed)
Prescription for Humalog sent to [facility].  This is dosed just like your Novolin R.  I called in a voucher so you can get a free box of Humalog Kwik pens.  Anticoagulation Dose Instructions as of 06/12/2013     Glynis Smiles Tue Wed Thu Fri Sat   New Dose 4 mg 6 mg 4 mg 4 mg 4 mg 6 mg 4 mg    Description       Take 1 tablet daily except 1 and 1/2 tablets on Mondays and Fridays     INR was 2.8 today

## 2013-06-12 NOTE — Progress Notes (Signed)
Diabetes Follow-Up Visit Chief Complaint:  Recheck INR / anitcoagulation and diabetes   HPI:  [name] is a patient that I usually see for protime/INR checks but she has recently been c/o variable BG readings.  She is especially concerned about low BG during the nights.  She report that she has been out of Novolin R for about 1 month due to cost but also states that she had an old bottle that she has been using occasionally.   Current Diabetes Medications:  levemir 22 units each morning, Novolin R -  0 to 8 units per sliding scale.  Patient has been out of Novolin R for about 1 month (she's in Medicare coverage gap)  Novolin R Sliding Scale:  150 - 200 4 units  201 - 250 6 units  251 - 300 8 units  Exam Edema:  Trace bilaterlly  Polyuria:  Negative   Polydipsia:  negative Polyphagia:  negative  BMI:  Body mass index is 30.06 kg/(m^2).    General Appearance:  obese Mood/Affect:  normal   Low fat/carbohydrate diet?  Yes Nicotine Abuse?  No Medication Compliance?  No Exercise?  No Alcohol Abuse?  No  Home BG Monitoring:  Checking  3 to 4  times a day. Average:  126  High: 238  Low:  60   A1c today was 7.1%  No results found for this basenameConcepcion Elk    Lab Results  Component Value Date   CHOL 190 07/23/2012   HDL 64 07/23/2012   LDLCALC 107* 07/23/2012   TRIG 93 07/23/2012   CHOLHDL 3.0 07/23/2012      Assessment: 1.  Diabetes.  A1c good but HBG show variable control 2.  Blood Pressure.  Good today 3.  Lipids.  Needs recheck but patient not fasting today 4.  Therapeutic anticoagulation  Recommendations: 1.  Medication recommendations at this time are as follows:  rx plus free voucher given to patient to get Humalog insulin pens that will last her until insurance coverage restarts January 1st.   Patient is instructed to only give Humalog with breakfast and lunch to see if this decreases her hypoglycemic episodes.  I really need more records that  she has today and some readings when she is taking fast acting insulin to be able to full assess her current regimen.  Continue Levemir at 22 units daily 2.  Reviewed HBG goals:  Fasting 80-130 and 1-2 hour post prandial <180.  Patient is instructed to check BG 3 times per day.    3.  BP goal < 140/80. 4.  LDL goal of < 100, HDL > 40 and TG < 150. 5.  Eye Exam yearly and Dental Exam every 6 months. 6.  Dietary recommendations:  Discussed briefly - patient to increase non starchy vegetables  7.  Physical Activity recommendations:  As able 8.   Anticoagulation Dose Instructions as of 06/12/2013     Glynis Smiles Tue Wed Thu Fri Sat   New Dose 4 mg 6 mg 4 mg 4 mg 4 mg 6 mg 4 mg    Description       Take 1 tablet daily except 1 and 1/2 tablets on Mondays and Fridays      9.  Return to clinic in 4-6 wks   Time spent counseling patient:  35 minutes   Referring provider:  Lady Deutscher, PharmD, CPP

## 2013-06-24 ENCOUNTER — Telehealth: Payer: Self-pay | Admitting: Pharmacist

## 2013-06-24 NOTE — Telephone Encounter (Signed)
Nose bleed has stopped.  Spoke with patient.  She is unable to come into office until Friday 06/28/13.   She is instructed to hold warfarin today and to only take 1/2 tablet daily thereafter until she can come in 06/28/13 to have protime rechecked.  She is also instructed that she should come into office ASAP or call EMT if she experiences another nose bleed.

## 2013-06-28 ENCOUNTER — Ambulatory Visit (INDEPENDENT_AMBULATORY_CARE_PROVIDER_SITE_OTHER): Payer: Medicare Other | Admitting: Pharmacist

## 2013-06-28 DIAGNOSIS — Z7901 Long term (current) use of anticoagulants: Secondary | ICD-10-CM

## 2013-06-28 LAB — POCT INR: INR: 1.2

## 2013-06-28 NOTE — Patient Instructions (Signed)
Anticoagulation Dose Instructions as of 06/28/2013     Dorene Grebe Tue Wed Thu Fri Sat   New Dose 4 mg 4 mg 4 mg 4 mg 4 mg 4 mg 4 mg    Description       Restart warfarin at 1 tablet daily      INR was 1.2 today

## 2013-07-05 ENCOUNTER — Other Ambulatory Visit: Payer: Self-pay | Admitting: *Deleted

## 2013-07-05 MED ORDER — GLUCOSE BLOOD VI STRP: ORAL_STRIP | Status: DC

## 2013-07-11 ENCOUNTER — Ambulatory Visit (INDEPENDENT_AMBULATORY_CARE_PROVIDER_SITE_OTHER): Payer: Medicare Other | Admitting: Pharmacist

## 2013-07-11 DIAGNOSIS — Z7901 Long term (current) use of anticoagulants: Secondary | ICD-10-CM

## 2013-07-11 DIAGNOSIS — R799 Abnormal finding of blood chemistry, unspecified: Secondary | ICD-10-CM

## 2013-07-11 DIAGNOSIS — R791 Abnormal coagulation profile: Secondary | ICD-10-CM

## 2013-07-11 LAB — POCT CBC
Granulocyte percent: 70.2 %G (ref 37–80)
HEMATOCRIT: 35.2 % — AB (ref 37.7–47.9)
HEMOGLOBIN: 10.7 g/dL — AB (ref 12.2–16.2)
LYMPH, POC: 1.7 (ref 0.6–3.4)
MCH: 26.8 pg — AB (ref 27–31.2)
MCHC: 30.4 g/dL — AB (ref 31.8–35.4)
MCV: 88.1 fL (ref 80–97)
MPV: 6.8 fL (ref 0–99.8)
POC Granulocyte: 5.4 (ref 2–6.9)
POC LYMPH %: 22.5 % (ref 10–50)
Platelet Count, POC: 305 10*3/uL (ref 142–424)
RBC: 4 M/uL — AB (ref 4.04–5.48)
RDW, POC: 14.7 %
WBC: 7.7 10*3/uL (ref 4.6–10.2)

## 2013-07-11 LAB — POCT INR: INR: 6.3

## 2013-07-11 MED ORDER — PHYTONADIONE 5 MG PO TABS: 2.5000 mg | ORAL_TABLET | Freq: Once | ORAL | Status: DC

## 2013-07-11 NOTE — Patient Instructions (Signed)
Anticoagulation Dose Instructions as of 07/11/2013     Dorene Grebe Tue Wed Thu Fri Sat   New Dose 2 mg 2 mg 2 mg 2 mg Hold Hold 2 mg    Description       Non warfarin for 2 days, then start 1/2 tablet daily.      INR was 6.3 today.

## 2013-07-11 NOTE — Progress Notes (Signed)
Patient's HBG was slightly low but stable compared to last available CBC results.  She will RTC next week and have INR and CBC rechecked.

## 2013-07-16 ENCOUNTER — Ambulatory Visit (INDEPENDENT_AMBULATORY_CARE_PROVIDER_SITE_OTHER): Payer: Medicare Other | Admitting: Pharmacist Clinician (PhC)/ Clinical Pharmacy Specialist

## 2013-07-16 DIAGNOSIS — R5383 Other fatigue: Secondary | ICD-10-CM

## 2013-07-16 DIAGNOSIS — R531 Weakness: Secondary | ICD-10-CM

## 2013-07-16 DIAGNOSIS — R5381 Other malaise: Secondary | ICD-10-CM

## 2013-07-16 DIAGNOSIS — D649 Anemia, unspecified: Secondary | ICD-10-CM

## 2013-07-16 DIAGNOSIS — Z7901 Long term (current) use of anticoagulants: Secondary | ICD-10-CM

## 2013-07-16 LAB — POCT CBC
GRANULOCYTE PERCENT: 73.7 % (ref 37–80)
HEMATOCRIT: 34.1 % — AB (ref 37.7–47.9)
HEMOGLOBIN: 10.4 g/dL — AB (ref 12.2–16.2)
Lymph, poc: 1.6 (ref 0.6–3.4)
MCH, POC: 27.3 pg (ref 27–31.2)
MCHC: 30.5 g/dL — AB (ref 31.8–35.4)
MCV: 89.7 fL (ref 80–97)
MPV: 7.5 fL (ref 0–99.8)
POC Granulocyte: 5.5 (ref 2–6.9)
POC LYMPH PERCENT: 21.9 %L (ref 10–50)
Platelet Count, POC: 275 10*3/uL (ref 142–424)
RBC: 3.8 M/uL — AB (ref 4.04–5.48)
RDW, POC: 15.7 %
WBC: 7.5 10*3/uL (ref 4.6–10.2)

## 2013-07-16 LAB — POCT INR: INR: 4.4

## 2013-07-17 LAB — BASIC METABOLIC PANEL
BUN/Creatinine Ratio: 21 (ref 11–26)
BUN: 34 mg/dL — ABNORMAL HIGH (ref 8–27)
CHLORIDE: 101 mmol/L (ref 97–108)
CO2: 23 mmol/L (ref 18–29)
CREATININE: 1.61 mg/dL — AB (ref 0.57–1.00)
Calcium: 8.6 mg/dL — ABNORMAL LOW (ref 8.7–10.3)
GFR, EST AFRICAN AMERICAN: 34 mL/min/{1.73_m2} — AB (ref >59)
GFR, EST NON AFRICAN AMERICAN: 29 mL/min/{1.73_m2} — AB (ref >59)
GLUCOSE: 170 mg/dL — AB (ref 65–99)
Potassium: 4.5 mmol/L (ref 3.5–5.2)
Sodium: 142 mmol/L (ref 134–144)

## 2013-07-23 ENCOUNTER — Ambulatory Visit (INDEPENDENT_AMBULATORY_CARE_PROVIDER_SITE_OTHER): Payer: Medicare Other | Admitting: Pharmacist Clinician (PhC)/ Clinical Pharmacy Specialist

## 2013-07-23 DIAGNOSIS — Z7901 Long term (current) use of anticoagulants: Secondary | ICD-10-CM

## 2013-07-23 LAB — POCT INR: INR: 1.3

## 2013-07-25 ENCOUNTER — Encounter: Payer: Self-pay | Admitting: Nurse Practitioner

## 2013-07-25 ENCOUNTER — Ambulatory Visit (INDEPENDENT_AMBULATORY_CARE_PROVIDER_SITE_OTHER): Payer: Medicare Other | Admitting: Nurse Practitioner

## 2013-07-25 ENCOUNTER — Other Ambulatory Visit: Payer: Self-pay | Admitting: Family Medicine

## 2013-07-25 VITALS — BP 123/61 | HR 53 | Temp 96.8°F | Ht 61.0 in | Wt 169.0 lb

## 2013-07-25 DIAGNOSIS — K5900 Constipation, unspecified: Secondary | ICD-10-CM

## 2013-07-25 DIAGNOSIS — K649 Unspecified hemorrhoids: Secondary | ICD-10-CM

## 2013-07-25 MED ORDER — HYDROCORTISONE 2.5 % RE CREA: 1.0000 "application " | TOPICAL_CREAM | Freq: Two times a day (BID) | RECTAL | Status: DC

## 2013-07-25 NOTE — Patient Instructions (Signed)

## 2013-07-25 NOTE — Progress Notes (Signed)
   Subjective:    Patient ID: [name], female    DOB: 1928-01-08, 80 y.o.   MRN: [id number]  HPI Patient in had- stomach virus a week or so ago- Since then she is constipated- feels like she needs to go but can't- Has tried an enema and ducolax with not much result- since straining to go to bathroom she has developed hemorrhoids.    Review of Systems  Constitutional: Negative.   HENT: Negative.   Cardiovascular: Negative.   Gastrointestinal: Positive for constipation and anal bleeding.  Genitourinary: Negative.   Neurological: Negative.   All other systems reviewed and are negative.       Objective:   Physical Exam  Constitutional: She is oriented to person, place, and time. She appears well-developed and well-nourished.  Cardiovascular: Normal rate, regular rhythm and normal heart sounds.   Pulmonary/Chest: Effort normal and breath sounds normal.  Abdominal: Soft. There is no tenderness.  Hypoactive bowel sounds Dull to percussion left lower quadrant  Musculoskeletal: Normal range of motion.  Neurological: She is alert and oriented to person, place, and time.  Skin: Skin is warm and dry.  Psychiatric: She has a normal mood and affect. Her behavior is normal. Judgment and thought content normal.   BP 123/61  Pulse 53  Temp(Src) 96.8 F (36 C) (Oral)  Ht 5\' 1"  (1.549 m)  Wt 169 lb (76.658 kg)  BMI 31.95 kg/m2        Assessment & Plan:  1. Hemorrhoids Try not to strain when going to bathroom - hydrocortisone (PROCTOSOL HC) 2.5 % rectal cream; Place 1 application rectally 2 (two) times daily.  Dispense: 30 g; Refill: 1  2. Constipation Continue stool softner Milk of magnesia and 6 oz of prune juice- if no results in 6 hors repeat Force fluids RTO prn  Mary-Margaret Hassell Done, FNP

## 2013-07-30 ENCOUNTER — Ambulatory Visit (INDEPENDENT_AMBULATORY_CARE_PROVIDER_SITE_OTHER): Payer: Medicare Other | Admitting: Pharmacist Clinician (PhC)/ Clinical Pharmacy Specialist

## 2013-07-30 DIAGNOSIS — Z7901 Long term (current) use of anticoagulants: Secondary | ICD-10-CM

## 2013-07-30 DIAGNOSIS — I2510 Atherosclerotic heart disease of native coronary artery without angina pectoris: Secondary | ICD-10-CM

## 2013-07-30 LAB — POCT INR: INR: 1.2

## 2013-08-06 ENCOUNTER — Ambulatory Visit (INDEPENDENT_AMBULATORY_CARE_PROVIDER_SITE_OTHER): Payer: Medicare Other | Admitting: Pharmacist Clinician (PhC)/ Clinical Pharmacy Specialist

## 2013-08-06 DIAGNOSIS — Z7901 Long term (current) use of anticoagulants: Secondary | ICD-10-CM

## 2013-08-06 DIAGNOSIS — I4891 Unspecified atrial fibrillation: Secondary | ICD-10-CM

## 2013-08-06 LAB — POCT GLYCOSYLATED HEMOGLOBIN (HGB A1C): Hemoglobin A1C: 1.1

## 2013-08-07 ENCOUNTER — Telehealth: Payer: Self-pay | Admitting: Nurse Practitioner

## 2013-08-07 NOTE — Telephone Encounter (Signed)
Patient wanted to let me know that she had to change her appt to 08/22/13

## 2013-08-21 ENCOUNTER — Ambulatory Visit (INDEPENDENT_AMBULATORY_CARE_PROVIDER_SITE_OTHER): Payer: Medicare Other | Admitting: Pharmacist

## 2013-08-21 DIAGNOSIS — Z7901 Long term (current) use of anticoagulants: Secondary | ICD-10-CM

## 2013-08-21 DIAGNOSIS — I4891 Unspecified atrial fibrillation: Secondary | ICD-10-CM

## 2013-08-21 LAB — POCT INR: INR: 1.6

## 2013-08-21 MED ORDER — BLOOD GLUCOSE TEST VI STRP: ORAL_STRIP | Status: DC

## 2013-08-21 NOTE — Patient Instructions (Signed)
Anticoagulation Dose Instructions as of 08/21/2013     [name] Tue Wed Thu Fri Sat   New Dose 4 mg 4 mg 4 mg 4 mg 4 mg 4 mg 4 mg    Description       Take 1 and 1/2 tablet then resume 1 tablet daily.      INR was 1.6 today (improved but goal is 2.0 to 3.0)

## 2013-08-22 ENCOUNTER — Other Ambulatory Visit: Payer: Self-pay | Admitting: *Deleted

## 2013-09-05 ENCOUNTER — Ambulatory Visit (INDEPENDENT_AMBULATORY_CARE_PROVIDER_SITE_OTHER): Payer: Medicare Other | Admitting: Pharmacist

## 2013-09-05 ENCOUNTER — Ambulatory Visit (INDEPENDENT_AMBULATORY_CARE_PROVIDER_SITE_OTHER): Payer: Medicare Other

## 2013-09-05 DIAGNOSIS — M542 Cervicalgia: Secondary | ICD-10-CM

## 2013-09-05 DIAGNOSIS — R040 Epistaxis: Secondary | ICD-10-CM

## 2013-09-05 DIAGNOSIS — Z7901 Long term (current) use of anticoagulants: Secondary | ICD-10-CM

## 2013-09-05 LAB — POCT INR: INR: 2.3

## 2013-09-05 NOTE — Progress Notes (Signed)
Regarding continued nose bleeds - patient is referred to ENT fro evaluation.  She is also instructed to continue to appr AYR gel as needed to relieve nasal dryness. Patient also c/o of continued neck pain that she feel might be muscle related. Discussed with Dr Laurance Flatten and he recommends that she have a C Spine x ray done today.  Order done.   Patient also advised can take APAP ad needed and use moist heat per Dr Laurance Flatten.   Cherre Robins, PharmD, CPP

## 2013-09-05 NOTE — Patient Instructions (Addendum)
Anticoagulation Dose Instructions as of 09/05/2013     [name] Tue Wed Thu Fri Sat   New Dose 4 mg 4 mg 4 mg 4 mg 4 mg 4 mg 4 mg    Description       Take 1 tablet (= 4mg ) daily.      INR was 2.3 today  Sending referral to [facility], Nose and Throat Specialist for nose bleeds Use warm, moist heat on neck as needed for pain May take tylenol / acetaminophen 500mg  as needed for pain

## 2013-09-10 ENCOUNTER — Telehealth (HOSPITAL_COMMUNITY): Payer: Self-pay | Admitting: *Deleted

## 2013-09-17 ENCOUNTER — Other Ambulatory Visit: Payer: Self-pay | Admitting: Cardiovascular Disease

## 2013-09-18 NOTE — Telephone Encounter (Signed)
Rx was sent to pharmacy electronically. 

## 2013-09-24 ENCOUNTER — Encounter (HOSPITAL_COMMUNITY): Payer: Medicare Other

## 2013-09-24 ENCOUNTER — Ambulatory Visit: Payer: Self-pay | Admitting: Family Medicine

## 2013-09-25 ENCOUNTER — Ambulatory Visit (HOSPITAL_COMMUNITY)
Admission: RE | Admit: 2013-09-25 | Discharge: 2013-09-25 | Disposition: A | Discharge status: Home / Self Care | Payer: Medicare Other | Source: Ambulatory Visit | Attending: Cardiovascular Disease | Admitting: Cardiovascular Disease

## 2013-09-25 ENCOUNTER — Other Ambulatory Visit (HOSPITAL_COMMUNITY): Payer: Self-pay | Admitting: Cardiovascular Disease

## 2013-09-25 ENCOUNTER — Ambulatory Visit: Payer: Self-pay | Admitting: Family Medicine

## 2013-09-25 DIAGNOSIS — I6529 Occlusion and stenosis of unspecified carotid artery: Secondary | ICD-10-CM

## 2013-09-25 NOTE — Progress Notes (Signed)
Carotid Duplex Completed. Meeyah Ovitt, BS, RDMS, RVT  

## 2013-09-26 ENCOUNTER — Telehealth: Payer: Self-pay | Admitting: Family Medicine

## 2013-09-26 ENCOUNTER — Other Ambulatory Visit: Payer: Self-pay | Admitting: *Deleted

## 2013-09-26 MED ORDER — INSULIN DETEMIR 100 UNIT/ML ~~LOC~~ SOLN: 20.0000 [IU] | Freq: Every morning | SUBCUTANEOUS | Status: DC

## 2013-09-26 NOTE — Telephone Encounter (Signed)
Tried to contact patient but number was busy.

## 2013-09-30 NOTE — Telephone Encounter (Signed)
Patient was able to get her medication so she is good.

## 2013-10-01 ENCOUNTER — Ambulatory Visit: Payer: Self-pay | Admitting: Family Medicine

## 2013-10-03 ENCOUNTER — Encounter: Payer: Self-pay | Admitting: *Deleted

## 2013-10-03 ENCOUNTER — Telehealth: Payer: Self-pay | Admitting: *Deleted

## 2013-10-03 DIAGNOSIS — I6529 Occlusion and stenosis of unspecified carotid artery: Secondary | ICD-10-CM

## 2013-10-03 NOTE — Telephone Encounter (Signed)
Message copied by Chauncy Lean on Thu Oct 03, 2013  1:03 PM ------      Message from: Lorretta Harp      Created: Thu Oct 03, 2013  6:32 AM       No change from prior study. Repeat in 6 months ------

## 2013-10-03 NOTE — Telephone Encounter (Signed)
Order placed for repeat carotid dopplers in 6 months  

## 2013-10-08 ENCOUNTER — Other Ambulatory Visit: Payer: Self-pay | Admitting: Cardiovascular Disease

## 2013-10-10 ENCOUNTER — Ambulatory Visit (INDEPENDENT_AMBULATORY_CARE_PROVIDER_SITE_OTHER): Payer: Medicare Other | Admitting: Pharmacist

## 2013-10-10 DIAGNOSIS — I2510 Atherosclerotic heart disease of native coronary artery without angina pectoris: Secondary | ICD-10-CM

## 2013-10-10 DIAGNOSIS — I739 Peripheral vascular disease, unspecified: Secondary | ICD-10-CM

## 2013-10-10 DIAGNOSIS — Z7901 Long term (current) use of anticoagulants: Secondary | ICD-10-CM

## 2013-10-10 LAB — POCT INR: INR: 3

## 2013-10-10 NOTE — Patient Instructions (Signed)
Anticoagulation Dose Instructions as of 10/10/2013     [name] Tue Wed Thu Fri Sat   New Dose 4 mg 4 mg 4 mg 4 mg 4 mg 4 mg 4 mg    Description       Restart 1 tablet (= 4mg ) daily.      INR was 3.0 today

## 2013-10-21 ENCOUNTER — Other Ambulatory Visit: Payer: Self-pay | Admitting: Cardiovascular Disease

## 2013-10-21 NOTE — Telephone Encounter (Signed)
Rx was sent to pharmacy electronically. 

## 2013-10-22 ENCOUNTER — Other Ambulatory Visit: Payer: Self-pay | Admitting: *Deleted

## 2013-10-22 MED ORDER — POTASSIUM CHLORIDE CRYS ER 20 MEQ PO TBCR: EXTENDED_RELEASE_TABLET | ORAL | Status: DC

## 2013-10-22 NOTE — Telephone Encounter (Signed)
Per Dalene Seltzer, pt scheduled appt for 6.2.15.  Refill for K+ sent for #35 and cancelled previous refill sent today.  Pt must keep appt for refills.

## 2013-11-05 ENCOUNTER — Other Ambulatory Visit: Payer: Self-pay | Admitting: Cardiovascular Disease

## 2013-11-05 NOTE — Telephone Encounter (Signed)
Rx was sent to pharmacy electronically. Patient has appointment scheduled on 6/2 with Dr Gwenlyn Found. Must keep appointmenr for future refills.

## 2013-11-07 ENCOUNTER — Other Ambulatory Visit: Payer: Self-pay

## 2013-11-07 MED ORDER — METOPROLOL SUCCINATE ER 25 MG PO TB24: 12.5000 mg | ORAL_TABLET | Freq: Every day | ORAL | Status: DC

## 2013-11-25 ENCOUNTER — Telehealth: Payer: Self-pay | Admitting: *Deleted

## 2013-11-25 ENCOUNTER — Ambulatory Visit (INDEPENDENT_AMBULATORY_CARE_PROVIDER_SITE_OTHER): Payer: Medicare Other | Admitting: Pharmacist

## 2013-11-25 DIAGNOSIS — Z7901 Long term (current) use of anticoagulants: Secondary | ICD-10-CM

## 2013-11-25 DIAGNOSIS — I4891 Unspecified atrial fibrillation: Secondary | ICD-10-CM

## 2013-11-25 LAB — POCT INR: INR: 3.2

## 2013-11-25 NOTE — Patient Instructions (Signed)
Anticoagulation Dose Instructions as of 11/25/2013     [name] Tue Wed Thu Fri Sat   New Dose 4 mg 4 mg 4 mg 2 mg 4 mg 4 mg 4 mg    Description       No warfarin today.  Then decrease to 1/2 tablet on wednesdays and 1 tablet all other days.        INR was 3.2 today ( a little too think)

## 2013-11-25 NOTE — Telephone Encounter (Signed)
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  She asked for and is due follow up - can you see if he is going to continue to see her for chronic conditions. ------

## 2013-11-25 NOTE — Telephone Encounter (Signed)
Spoke with pt regarding appt She didn't want to schedule follow up right now She will call back to schedule

## 2013-11-26 ENCOUNTER — Encounter: Payer: Self-pay | Admitting: Cardiovascular Disease

## 2013-11-26 ENCOUNTER — Ambulatory Visit (INDEPENDENT_AMBULATORY_CARE_PROVIDER_SITE_OTHER): Payer: Medicare Other | Admitting: Cardiovascular Disease

## 2013-11-26 VITALS — BP 136/86 | HR 75 | Ht 60.5 in | Wt 152.0 lb

## 2013-11-26 DIAGNOSIS — I739 Peripheral vascular disease, unspecified: Secondary | ICD-10-CM

## 2013-11-26 DIAGNOSIS — I4891 Unspecified atrial fibrillation: Secondary | ICD-10-CM

## 2013-11-26 DIAGNOSIS — I10 Essential (primary) hypertension: Secondary | ICD-10-CM

## 2013-11-26 DIAGNOSIS — E785 Hyperlipidemia, unspecified: Secondary | ICD-10-CM

## 2013-11-26 DIAGNOSIS — I2510 Atherosclerotic heart disease of native coronary artery without angina pectoris: Secondary | ICD-10-CM

## 2013-11-26 NOTE — Assessment & Plan Note (Signed)
History of paroxysmal atrial fibrillation on Coumadin anticoagulation maintaining sinus rhythm. She is also on low-dose amiodarone

## 2013-11-26 NOTE — Assessment & Plan Note (Signed)
Her last cath was performed by Dr. Ellouise Newer 01/25/2010 revealed a chronically totally occluded dominant RCA, 50% proximal circumflex with left to right collaterals and normal LV function. She did have recent episodes of nitroglycerin responsive angina several weeks ago but these have not recurred. We will continue conservative

## 2013-11-26 NOTE — Assessment & Plan Note (Signed)
The patient has moderate right and mild left ICA stenosis followed by duplex ultrasound on a serial basis. She is neurologically asymptomatic

## 2013-11-26 NOTE — Progress Notes (Signed)
11/26/2013 [name]   August 21, 1927  [id number]  Primary Physician Redge Gainer, MD Primary Cardiologist: Melinda Harp MD [name]   HPI:  The patient is an 80 year old, moderately overweight, widowed Caucasian female, mother of 2, grandmother to 4 grandchildren who I last saw in the office 6 months ago. She has a history of CAD status post cath in 2006 revealing an occluded RCA with left-to-right collaterals and otherwise noncritical CAD with EF of 25% to 30% at that time. EF subsequently improved to normal by 2D echo. Cath performed January 25, 2010, showed unchanged anatomy. Her other problems include hypertension, hyperlipidemia and insulin-dependent diabetes. She has COPD on home O2, quit smoking many years ago. She has moderate carotid disease right greater than left which we are following by duplex ultrasound. She is neurologically asymptomatic. She has had paroxysmal A-fib in the past on Coumadin anticoagulation. Dr. Halford Chessman follows her pulmonary status. She was hospitalized August 22, 2011 through August 26, 2011, with congestive heart failure secondary to diastolic dysfunction . As I saw her back here again she's doing clinically stable. She did have several episodes and that her response of angina however these were infrequent.    Current Outpatient Prescriptions  Medication Sig Dispense Refill  . ALPRAZolam (XANAX) 0.25 MG tablet TAKE 1 TABLET AT BEDTIME AS NEEDED;MAY TAKE 1/2 TO 1 TABLET 2 TIMES ADAY AS NEEDED FOR ANXIETY  30 tablet  3  . amiodarone (PACERONE) 100 MG tablet Take 1 tablet (100 mg total) by mouth daily.  30 tablet  11  . aspirin 81 MG chewable tablet Chew 81 mg by mouth daily.      Marland Kitchen atorvastatin (LIPITOR) 40 MG tablet TAKE 1 TABLET IN THE EVENING  21 tablet  0  . Blood Glucose Monitoring Suppl (RELION ULTIMA GLUCOSE SYSTEM) W/DEVICE KIT 1 kit by Other route 6 (six) times daily. Use to check blood sugars up to six times per day as directed.     Diagnosis:  250.00 Insulin treated diabetes with history of hypoglycemia.  1 kit  0  . budesonide-formoterol (SYMBICORT) 160-4.5 MCG/ACT inhaler Inhale 1 puff into the lungs 2 (two) times daily.      . Cholecalciferol (VITAMIN D PO) Take 1 capsule by mouth every evening.      . fluticasone (FLONASE) 50 MCG/ACT nasal spray Place 2 sprays into both nostrils daily.  16 g  2  . furosemide (LASIX) 40 MG tablet Take 40 mg by mouth 2 (two) times daily.      Marland Kitchen glucosamine-chondroitin 500-400 MG tablet Take 1 tablet by mouth 2 (two) times daily.      . Glucose Blood (BLOOD GLUCOSE TEST STRIPS) STRP Use to check BG up to four times daily  200 each  5  . glucose blood test strip Relion Ultima test strips Test qid Dx 250.00  200 each  1  . hydrocortisone (PROCTOSOL HC) 2.5 % rectal cream Place 1 application rectally 2 (two) times daily.  30 g  1  . insulin detemir (LEVEMIR) 100 UNIT/ML injection Inject 0.2 mLs (20 Units total) into the skin every morning.  10 mL  2  . insulin regular (NOVOLIN R,HUMULIN R) 100 units/mL injection Inject 0-8 Units into the skin 3 (three) times daily before meals. Sliding scale      . Ipratropium-Albuterol (COMBIVENT) 20-100 MCG/ACT AERS respimat Inhale 1 puff into the lungs every 6 (six) hours as needed.      . isosorbide mononitrate (IMDUR) 60 MG  24 hr tablet Take 1 tablet (60 mg total) by mouth daily.  30 tablet  12  . metoprolol succinate (TOPROL-XL) 25 MG 24 hr tablet Take 0.5 tablets (12.5 mg total) by mouth daily.  15 tablet  1  . Multiple Vitamins-Minerals (PRESERVISION/LUTEIN) CAPS Take 1 capsule by mouth daily.       Marland Kitchen NIFEDICAL XL 30 MG 24 hr tablet TAKE 1 TABLET DAILY  21 tablet  0  . nitroGLYCERIN (NITROSTAT) 0.4 MG SL tablet Place 1 tablet (0.4 mg total) under the tongue every 5 (five) minutes as needed for chest pain.  25 tablet  5  . pantoprazole (PROTONIX) 40 MG tablet Take 40 mg by mouth daily.      . potassium chloride SA (K-DUR,KLOR-CON) 20 MEQ tablet TAKE 1  TABLET DAILY  35 tablet  0  . senna-docusate (SENOKOT-S) 8.6-50 MG per tablet Take 1 tablet by mouth 2 (two) times daily.      . simethicone (MYLICON) 80 MG chewable tablet Chew 80 mg by mouth at bedtime as needed for flatulence.      . vitamin C (ASCORBIC ACID) 500 MG tablet Take 500 mg by mouth every evening.       . warfarin (COUMADIN) 4 MG tablet daily. As directed by the [facility]       Current Facility-Administered Medications  Medication Dose Route Frequency Provider Last Rate Last Dose  . phytonadione (VITAMIN K) tablet 2.5 mg  2.5 mg Oral Once Tammy Eckard, PHARMD        Allergies  Allergen Reactions  . Atorvastatin Other (See Comments)     muscle aches  . Codeine Nausea And Vomiting  . Iohexol Other (See Comments)     Desc: unknown reaction; allergic to iodine and contrast   . Morphine Nausea And Vomiting  . Other Other (See Comments)    OPIATES  . Rosuvastatin Other (See Comments)    muscle aches at high doses, held as of 12/2010 due to aches    History   Social History  . Marital Status: Widowed    Spouse Name: N/A    Number of Children: N/A  . Years of Education: N/A   Occupational History  . Not on file.   Social History Main Topics  . Smoking status: Former Smoker -- 1.50 packs/day for 15 years    Types: Cigarettes    Quit date: 06/27/1993  . Smokeless tobacco: Never Used     Comment: Smoked 1.5 packs per day for 15 years, quit in 1995.  Marland Kitchen Alcohol Use: Yes     Comment: Occasional, 1-2 glasses of wine daily  . Drug Use: No  . Sexual Activity: No   Other Topics Concern  . Not on file   Social History Narrative   Widowed, lives with daughter     Review of Systems: General: negative for chills, fever, night sweats or weight changes.  Cardiovascular: negative for chest pain, dyspnea on exertion, edema, orthopnea, palpitations, paroxysmal nocturnal dyspnea or shortness of breath Dermatological: negative for rash Respiratory: negative for cough  or wheezing Urologic: negative for hematuria Abdominal: negative for nausea, vomiting, diarrhea, bright red blood per rectum, melena, or hematemesis Neurologic: negative for visual changes, syncope, or dizziness All other systems reviewed and are otherwise negative except as noted above.    Blood pressure 136/86, pulse 75, height 5' 0.5" (1.537 m), weight 152 lb (68.947 kg).  General appearance: alert and no distress Neck: no adenopathy, no JVD, supple, symmetrical, trachea midline,  thyroid not enlarged, symmetric, no tenderness/mass/nodules and bilateral carotid bruits Lungs: clear to auscultation bilaterally Heart: regular rate and rhythm, S1, S2 normal, no murmur, click, rub or gallop Extremities: extremities normal, atraumatic, no cyanosis or edema  EKG normal sinus rhythm at 75 with right bundle branch block  ASSESSMENT AND PLAN:   PVD (peripheral vascular disease), moderate carotid and LSCA disease The patient has moderate right and mild left ICA stenosis followed by duplex ultrasound on a serial basis. She is neurologically asymptomatic  PAF, recurrance this admission, on chronic Amio/ Coumadin History of paroxysmal atrial fibrillation on Coumadin anticoagulation maintaining sinus rhythm. She is also on low-dose amiodarone  HTN (hypertension) Controlled on current medications  CAD, remote RCA PCI, subsequently noted to be occluded, last cath 8/11 Her last cath was performed by Dr. Ellouise Newer 01/25/2010 revealed a chronically totally occluded dominant RCA, 50% proximal circumflex with left to right collaterals and normal LV function. She did have recent episodes of nitroglycerin responsive angina several weeks ago but these have not recurred. We will continue conservative      Melinda Harp MD [facility], [facility] 11/26/2013 4:05 PM

## 2013-11-26 NOTE — Assessment & Plan Note (Signed)
Controlled on current medications 

## 2013-11-26 NOTE — Patient Instructions (Signed)
Your physician recommends that you schedule a follow-up appointment in: 1 year  

## 2013-11-26 NOTE — Assessment & Plan Note (Signed)
On statin therapy followed by his her PCP

## 2013-11-27 ENCOUNTER — Encounter: Payer: Self-pay | Admitting: Cardiovascular Disease

## 2013-11-27 ENCOUNTER — Other Ambulatory Visit: Payer: Self-pay | Admitting: *Deleted

## 2013-11-27 MED ORDER — ISOSORBIDE MONONITRATE ER 60 MG PO TB24: 60.0000 mg | ORAL_TABLET | Freq: Every day | ORAL | Status: DC

## 2013-11-27 NOTE — Telephone Encounter (Signed)
Can you review med and refill if okay? Pt was just seen by Dr. Gwenlyn Found yesterday. See his note please.

## 2013-12-04 ENCOUNTER — Other Ambulatory Visit: Payer: Self-pay | Admitting: Cardiovascular Disease

## 2013-12-04 ENCOUNTER — Other Ambulatory Visit: Payer: Self-pay | Admitting: *Deleted

## 2013-12-04 MED ORDER — INSULIN DETEMIR 100 UNIT/ML ~~LOC~~ SOLN: 20.0000 [IU] | Freq: Every morning | SUBCUTANEOUS | Status: DC

## 2013-12-04 NOTE — Telephone Encounter (Signed)
Rx was sent to pharmacy electronically. 

## 2013-12-05 ENCOUNTER — Telehealth: Payer: Self-pay | Admitting: Family Medicine

## 2013-12-06 NOTE — Telephone Encounter (Signed)
It looks like patient has had humalog in past - I have only seen her for protime and am not familiar with her diabetes regimen.  Please have [facility] resend request and I will review.

## 2013-12-06 NOTE — Telephone Encounter (Signed)
I don't see Humalog? I see Novolin R., Levemir was sent already. Pls. help

## 2013-12-09 ENCOUNTER — Other Ambulatory Visit: Payer: Self-pay | Admitting: Cardiovascular Disease

## 2013-12-09 ENCOUNTER — Ambulatory Visit (INDEPENDENT_AMBULATORY_CARE_PROVIDER_SITE_OTHER): Payer: Medicare Other | Admitting: Pharmacist

## 2013-12-09 DIAGNOSIS — E1169 Type 2 diabetes mellitus with other specified complication: Secondary | ICD-10-CM

## 2013-12-09 DIAGNOSIS — E1122 Type 2 diabetes mellitus with diabetic chronic kidney disease: Secondary | ICD-10-CM

## 2013-12-09 DIAGNOSIS — N183 Chronic kidney disease, stage 3 unspecified: Secondary | ICD-10-CM

## 2013-12-09 DIAGNOSIS — E1129 Type 2 diabetes mellitus with other diabetic kidney complication: Secondary | ICD-10-CM

## 2013-12-09 DIAGNOSIS — Z7901 Long term (current) use of anticoagulants: Secondary | ICD-10-CM

## 2013-12-09 DIAGNOSIS — I4891 Unspecified atrial fibrillation: Secondary | ICD-10-CM

## 2013-12-09 DIAGNOSIS — E785 Hyperlipidemia, unspecified: Secondary | ICD-10-CM

## 2013-12-09 LAB — POCT INR: INR: 1.4

## 2013-12-09 LAB — POCT GLYCOSYLATED HEMOGLOBIN (HGB A1C)

## 2013-12-09 MED ORDER — INSULIN LISPRO 100 UNIT/ML (KWIKPEN): PEN_INJECTOR | SUBCUTANEOUS | Status: DC

## 2013-12-09 NOTE — Telephone Encounter (Signed)
Rx was sent to pharmacy electronically. 

## 2013-12-09 NOTE — Patient Instructions (Signed)
Anticoagulation Dose Instructions as of 12/09/2013     Dorene Grebe Tue Wed Thu Fri Sat   New Dose 4 mg 4 mg 4 mg 2 mg 4 mg 4 mg 4 mg    Description       Take 1 and 1/2 tablets today, then change to 1 tablet a day.        INR was 1.4 today    LEVEMIR if LONG acting and you take 20 units ONCE a day  HUMALOG is SHORT acting and you take per [facility] SCALE

## 2013-12-09 NOTE — Progress Notes (Signed)
HPI:  I received precription request for Humalog from [facility] for Mrs. Wheeless.  It has been several months since an A1c was checked so I checked today - was 6.9%.  I discussed current medications for diabetes and patient was a little confused about which insulin was long acting and which was short acting.  She initially stated that she was taking Humalog 20 units in the morning and the she took Levemir sliding scale.  After her daughter in law retrieved medications from patient's home - she still seemed confused and maybe had directions on Levemir and Humalog mixed up.  After further discussion and reading the labels the patient changed and told me she was taking levemir once a day and Humalog on a sliding scale.    No HBG readings to evaluate. - patient did not bring in to office.   Assessment -  Diabetes - A1c at goal   Plan: Educated provided about long vs short acting insulin  Written instructions about difference given and patient repeated back to me proper dosing of both Levemir and Humalog insulin.  Patient is getting insulin from mail order now because it is cheaper. RTC in 1 month  Cherre Robins, PharmD, CPP

## 2013-12-10 ENCOUNTER — Other Ambulatory Visit: Payer: Self-pay | Admitting: *Deleted

## 2013-12-10 ENCOUNTER — Other Ambulatory Visit: Payer: Self-pay | Admitting: Cardiovascular Disease

## 2013-12-10 LAB — LIPID PANEL
CHOLESTEROL TOTAL: 167 mg/dL (ref 100–199)
Chol/HDL Ratio: 2.1 ratio units (ref 0.0–4.4)
HDL: 81 mg/dL (ref >39)
LDL Calculated: 68 mg/dL (ref 0–99)
TRIGLYCERIDES: 90 mg/dL (ref 0–149)
VLDL CHOLESTEROL CAL: 18 mg/dL (ref 5–40)

## 2013-12-10 LAB — CMP14+EGFR
ALK PHOS: 88 IU/L (ref 39–117)
ALT: 23 IU/L (ref 0–32)
AST: 21 IU/L (ref 0–40)
Albumin/Globulin Ratio: 1.4 (ref 1.1–2.5)
Albumin: 3.9 g/dL (ref 3.5–4.7)
BUN / CREAT RATIO: 19 (ref 11–26)
BUN: 30 mg/dL — ABNORMAL HIGH (ref 8–27)
CHLORIDE: 98 mmol/L (ref 97–108)
CO2: 30 mmol/L — ABNORMAL HIGH (ref 18–29)
Calcium: 9.8 mg/dL (ref 8.7–10.3)
Creatinine, Ser: 1.55 mg/dL — ABNORMAL HIGH (ref 0.57–1.00)
GFR, EST AFRICAN AMERICAN: 35 mL/min/{1.73_m2} — AB (ref >59)
GFR, EST NON AFRICAN AMERICAN: 31 mL/min/{1.73_m2} — AB (ref >59)
Globulin, Total: 2.7 g/dL (ref 1.5–4.5)
Glucose: 127 mg/dL — ABNORMAL HIGH (ref 65–99)
POTASSIUM: 5.6 mmol/L — AB (ref 3.5–5.2)
SODIUM: 143 mmol/L (ref 134–144)
Total Bilirubin: 0.6 mg/dL (ref 0.0–1.2)
Total Protein: 6.6 g/dL (ref 6.0–8.5)

## 2013-12-10 NOTE — Telephone Encounter (Signed)
Rx refill sent to patient pharmacy   

## 2013-12-11 ENCOUNTER — Telehealth: Payer: Self-pay | Admitting: Pharmacist

## 2013-12-11 MED ORDER — POTASSIUM CHLORIDE CRYS ER 20 MEQ PO TBCR: 10.0000 meq | EXTENDED_RELEASE_TABLET | Freq: Every day | ORAL | Status: DC

## 2013-12-11 NOTE — Telephone Encounter (Signed)
Decreased potassium to 67meq daily - patient's daughter in law and [place] contacted.

## 2013-12-17 ENCOUNTER — Ambulatory Visit (INDEPENDENT_AMBULATORY_CARE_PROVIDER_SITE_OTHER)
Admission: RE | Admit: 2013-12-17 | Discharge: 2013-12-17 | Disposition: A | Discharge status: Home / Self Care | Payer: Medicare Other | Source: Ambulatory Visit | Attending: Pulmonary Disease | Admitting: Pulmonary Disease

## 2013-12-17 ENCOUNTER — Ambulatory Visit (INDEPENDENT_AMBULATORY_CARE_PROVIDER_SITE_OTHER): Payer: Medicare Other | Admitting: Pulmonary Disease

## 2013-12-17 ENCOUNTER — Encounter: Payer: Self-pay | Admitting: Pulmonary Disease

## 2013-12-17 VITALS — BP 124/78 | HR 61 | Temp 97.8°F | Ht 61.0 in | Wt 157.2 lb

## 2013-12-17 DIAGNOSIS — R059 Cough, unspecified: Secondary | ICD-10-CM

## 2013-12-17 DIAGNOSIS — R05 Cough: Secondary | ICD-10-CM

## 2013-12-17 DIAGNOSIS — R058 Other specified cough: Secondary | ICD-10-CM

## 2013-12-17 DIAGNOSIS — J9611 Chronic respiratory failure with hypoxia: Secondary | ICD-10-CM

## 2013-12-17 DIAGNOSIS — R0789 Other chest pain: Secondary | ICD-10-CM

## 2013-12-17 DIAGNOSIS — J9610 Chronic respiratory failure, unspecified whether with hypoxia or hypercapnia: Secondary | ICD-10-CM

## 2013-12-17 DIAGNOSIS — J439 Emphysema, unspecified: Secondary | ICD-10-CM

## 2013-12-17 DIAGNOSIS — R0902 Hypoxemia: Secondary | ICD-10-CM

## 2013-12-17 DIAGNOSIS — J438 Other emphysema: Secondary | ICD-10-CM

## 2013-12-17 MED ORDER — GUAIFENESIN ER 600 MG PO TB12: 1200.0000 mg | ORAL_TABLET | Freq: Two times a day (BID) | ORAL | Status: DC | PRN

## 2013-12-17 MED ORDER — AZELASTINE-FLUTICASONE 137-50 MCG/ACT NA SUSP: 1.0000 | Freq: Two times a day (BID) | NASAL | Status: DC

## 2013-12-17 NOTE — Patient Instructions (Signed)
Dymista one spray each nostril twice per day Mucinex twice per day as needed Chest xray today Follow up in 4 months

## 2013-12-17 NOTE — Progress Notes (Signed)
Chief Complaint  Patient presents with  . Follow-up    Pt states that she has been having difficulty breathing at night-- Feels as though she is wearing "clothes pin" on nose--cannot breathe. Pt reports some pain in mid back.    CC: [name]  History of Present Illness: [name] is a 80 y.o. female former smoker with GOLD 4 COPD, and chronic respiratory failure with hypoxia.  She has noticed more trouble with her breathing at night.  She is getting sinus congestion and has post-nasal drainage into her throat when she lays flat.  She ran out of flonase.  She has also noticed discomfort in her right back and chest area.  She is not having cough or sputum.  She denies fever, hemoptysis, or wheeze.  She is not having leg swelling.  She use 2.5 liters oxygen with exertion and nightly with sleep.  Tests: PFT 06/08/10>>FEV1 0.93(62%), FEV1% 60, TLC 2.84(69%), DLCO 54%, +BD Echo 08/23/11>>mild LVH, EF 55 to 60%, mild AS, mild MR, mod/severe LA dilation ONO with 2.5 liters 05/07/12>>Test time 3 hrs 47 min. Mean SpO2 99%, low SpO2 96%. Echo 07/21/12 >> EF 55 to 40%, grade 2 diastolic dysfx, mild/mod MR, mild LA dilation  She  has a past medical history of Hyperlipidemia; Hypertension; Anxiety; GERD (gastroesophageal reflux disease); CAD (coronary artery disease); Diastolic dysfunction; RBBB (right bundle branch block); Paroxysmal atrial fibrillation; Hypoxemic respiratory failure, chronic; COPD (chronic obstructive pulmonary disease); Secondary pulmonary hypertension; Edema; Angina; Shortness of breath; CHF (congestive heart failure); Diabetes mellitus; Bradycardia (08/24/2011); Carotid artery disease; PAF (paroxysmal atrial fibrillation); CHF (congestive heart failure); History of stress test (07/02/09); Lower extremity edema; Carotid bruit; Mitral valve regurgitation; Sleep-related hypoventilation; COPD with emphysema; and Coronary atherosclerosis of unspecified type of vessel, native or  graft.  She  has past surgical history that includes Rotator cuff repair (07/2005); Abdominal hysterectomy; Cardiac catheterization (2006 & 2011); Coronary angioplasty; Coronary angioplasty with stent; Carotid Duplex (12/05/11); and Venous Duplex (07/27/11).   Allergies  Allergen Reactions  . Atorvastatin Other (See Comments)     muscle aches  . Codeine Nausea And Vomiting  . Iohexol Other (See Comments)     Desc: unknown reaction; allergic to iodine and contrast   . Morphine Nausea And Vomiting  . Other Other (See Comments)    OPIATES  . Rosuvastatin Other (See Comments)    muscle aches at high doses, held as of 12/2010 due to aches    Physical Exam:  General - No distress, in wheelchair HEENT - no sinus tenderness, no oral exudate, no LAN Cardiac - s1s2 Chest - no wheeze/rales Abdomen - soft, nontender Extremities - no edema Skin - no rashes Neurologic - normal strength Psychiatric - normal mood, behavior   Assessment/Plan:  Chesley Mires, MD [place] 12/17/2013, 12:01 PM Pager:  [phone] After 3pm call: [phone]

## 2013-12-18 ENCOUNTER — Telehealth: Payer: Self-pay | Admitting: Pulmonary Disease

## 2013-12-18 DIAGNOSIS — R0789 Other chest pain: Secondary | ICD-10-CM | POA: Insufficient documentation

## 2013-12-18 NOTE — Telephone Encounter (Signed)
Dg Chest 2 View  12/17/2013   CLINICAL DATA:  COPD, increased cough, back pain, sneezing, former smoker, past history hypertension, hyperlipidemia, coronary artery disease with diastolic dysfunction, secondary pulmonary hypertension, CHF, diabetes  EXAM: CHEST  2 VIEW  COMPARISON:  05/20/2013  FINDINGS: Enlargement of cardiac silhouette with pulmonary vascular congestion.  Atherosclerotic calcification of an elongated thoracic aorta.  Enlarged central pulmonary arteries.  Chronic bronchitic changes.  No acute infiltrate, pleural effusion or pneumothorax.  Minimal chronic atelectasis versus scarring at bases.  Bones demineralized with mild degenerative disc disease changes thoracic spine.  IMPRESSION: Enlargement of cardiac silhouette with pulmonary vascular congestion.  Chronic bronchitic changes.  Question pulmonary arterial hypertension.  No acute abnormalities.   Electronically Signed   By: Lavonia Dana M.D.   On: 12/17/2013 14:03    Will have my nurse inform pt that CXR shows expected changes from COPD and chronic diastolic heart failure.  No new findings.  No change to current treatment plan.

## 2013-12-18 NOTE — Assessment & Plan Note (Signed)
She is to continue symbicort and prn combivent.

## 2013-12-18 NOTE — Assessment & Plan Note (Signed)
Much of her night time symptoms are likely related to nasal congestion and post-nasal drip.  Will have her try dymista.

## 2013-12-18 NOTE — Assessment & Plan Note (Signed)
Exam is unremarkable.  Will check chest xray.

## 2013-12-18 NOTE — Assessment & Plan Note (Signed)
She is to continue 2.5 liters oxygen with exertion and sleep.

## 2013-12-19 NOTE — Telephone Encounter (Signed)
LMOM x 1 

## 2013-12-20 ENCOUNTER — Telehealth: Payer: Self-pay | Admitting: Pulmonary Disease

## 2013-12-20 NOTE — Telephone Encounter (Signed)
LM for daughter (daughter-in-law) to return call. Per patient chart, requests that she be called for "everything" d/t the patient's hearing

## 2013-12-20 NOTE — Telephone Encounter (Signed)
Virl Cagey, CMA at 12/20/2013 4:19 PM     Status: Signed        Spoke with patient daughter-in-law  Aware of patients cxr results. Will relay message to patient.  Nothing further needed.

## 2013-12-20 NOTE — Telephone Encounter (Signed)
Spoke with patient daughter-in-law Aware of patients cxr results. Will relay message to patient. Nothing further needed.

## 2013-12-24 ENCOUNTER — Other Ambulatory Visit: Payer: Self-pay | Admitting: Nurse Practitioner

## 2013-12-24 ENCOUNTER — Telehealth: Payer: Self-pay | Admitting: Pulmonary Disease

## 2013-12-24 MED ORDER — FLUTICASONE PROPIONATE 50 MCG/ACT NA SUSP: 1.0000 | Freq: Two times a day (BID) | NASAL | Status: AC

## 2013-12-24 MED ORDER — AZELASTINE HCL 0.15 % NA SOLN: 1.0000 | Freq: Two times a day (BID) | NASAL | Status: DC

## 2013-12-24 NOTE — Telephone Encounter (Signed)
Please check with pt if she is okay with using two separate nose sprays.  If she is okay, then okay to send orders.  If she is not, then please initiate prior authorization.

## 2013-12-24 NOTE — Telephone Encounter (Signed)
Called spoke with Coralyn Mark. She reports to call in the 2 separate medications for dymsita. Even with PA the copay is still expensive. I advised will do so. Nothing further needed

## 2013-12-24 NOTE — Telephone Encounter (Signed)
Called spoke with [facility]. Pt insurance is requiring PA for dymista. Dr. Halford Chessman would you like for [place] to start a PA or call int he 2 separate medications that make up dymista? thanks

## 2013-12-25 ENCOUNTER — Other Ambulatory Visit: Payer: Self-pay | Admitting: *Deleted

## 2013-12-25 MED ORDER — WARFARIN SODIUM 4 MG PO TABS: 4.0000 mg | ORAL_TABLET | Freq: Every day | ORAL | Status: DC

## 2013-12-27 ENCOUNTER — Encounter (HOSPITAL_COMMUNITY): Payer: Self-pay | Admitting: Emergency Medicine

## 2013-12-27 ENCOUNTER — Emergency Department (HOSPITAL_COMMUNITY): Payer: Medicare Other

## 2013-12-27 ENCOUNTER — Inpatient Hospital Stay (HOSPITAL_COMMUNITY)
Admission: EM | Admit: 2013-12-27 | Discharge: 2013-12-30 | DRG: 292 | Disposition: A | Discharge status: Home / Self Care | Payer: Medicare Other | Attending: Internal Medicine | Admitting: Internal Medicine

## 2013-12-27 DIAGNOSIS — I509 Heart failure, unspecified: Secondary | ICD-10-CM

## 2013-12-27 DIAGNOSIS — J438 Other emphysema: Secondary | ICD-10-CM | POA: Diagnosis present

## 2013-12-27 DIAGNOSIS — Z9981 Dependence on supplemental oxygen: Secondary | ICD-10-CM

## 2013-12-27 DIAGNOSIS — I482 Chronic atrial fibrillation, unspecified: Secondary | ICD-10-CM

## 2013-12-27 DIAGNOSIS — N183 Chronic kidney disease, stage 3 unspecified: Secondary | ICD-10-CM

## 2013-12-27 DIAGNOSIS — I480 Paroxysmal atrial fibrillation: Secondary | ICD-10-CM | POA: Diagnosis present

## 2013-12-27 DIAGNOSIS — J9610 Chronic respiratory failure, unspecified whether with hypoxia or hypercapnia: Secondary | ICD-10-CM | POA: Diagnosis present

## 2013-12-27 DIAGNOSIS — Z7982 Long term (current) use of aspirin: Secondary | ICD-10-CM

## 2013-12-27 DIAGNOSIS — Z8249 Family history of ischemic heart disease and other diseases of the circulatory system: Secondary | ICD-10-CM

## 2013-12-27 DIAGNOSIS — I059 Rheumatic mitral valve disease, unspecified: Secondary | ICD-10-CM | POA: Diagnosis present

## 2013-12-27 DIAGNOSIS — I739 Peripheral vascular disease, unspecified: Secondary | ICD-10-CM | POA: Diagnosis present

## 2013-12-27 DIAGNOSIS — Z825 Family history of asthma and other chronic lower respiratory diseases: Secondary | ICD-10-CM

## 2013-12-27 DIAGNOSIS — Z90710 Acquired absence of both cervix and uterus: Secondary | ICD-10-CM

## 2013-12-27 DIAGNOSIS — Z888 Allergy status to other drugs, medicaments and biological substances status: Secondary | ICD-10-CM

## 2013-12-27 DIAGNOSIS — J9611 Chronic respiratory failure with hypoxia: Secondary | ICD-10-CM

## 2013-12-27 DIAGNOSIS — Z885 Allergy status to narcotic agent status: Secondary | ICD-10-CM

## 2013-12-27 DIAGNOSIS — Z794 Long term (current) use of insulin: Secondary | ICD-10-CM

## 2013-12-27 DIAGNOSIS — E1129 Type 2 diabetes mellitus with other diabetic kidney complication: Secondary | ICD-10-CM | POA: Diagnosis present

## 2013-12-27 DIAGNOSIS — I5033 Acute on chronic diastolic (congestive) heart failure: Principal | ICD-10-CM | POA: Diagnosis present

## 2013-12-27 DIAGNOSIS — R791 Abnormal coagulation profile: Secondary | ICD-10-CM

## 2013-12-27 DIAGNOSIS — F411 Generalized anxiety disorder: Secondary | ICD-10-CM | POA: Diagnosis present

## 2013-12-27 DIAGNOSIS — I10 Essential (primary) hypertension: Secondary | ICD-10-CM

## 2013-12-27 DIAGNOSIS — I4510 Unspecified right bundle-branch block: Secondary | ICD-10-CM | POA: Diagnosis present

## 2013-12-27 DIAGNOSIS — K219 Gastro-esophageal reflux disease without esophagitis: Secondary | ICD-10-CM | POA: Diagnosis present

## 2013-12-27 DIAGNOSIS — D649 Anemia, unspecified: Secondary | ICD-10-CM | POA: Diagnosis present

## 2013-12-27 DIAGNOSIS — E1122 Type 2 diabetes mellitus with diabetic chronic kidney disease: Secondary | ICD-10-CM

## 2013-12-27 DIAGNOSIS — I2510 Atherosclerotic heart disease of native coronary artery without angina pectoris: Secondary | ICD-10-CM | POA: Diagnosis present

## 2013-12-27 DIAGNOSIS — Z87891 Personal history of nicotine dependence: Secondary | ICD-10-CM

## 2013-12-27 DIAGNOSIS — I129 Hypertensive chronic kidney disease with stage 1 through stage 4 chronic kidney disease, or unspecified chronic kidney disease: Secondary | ICD-10-CM | POA: Diagnosis present

## 2013-12-27 DIAGNOSIS — Z91041 Radiographic dye allergy status: Secondary | ICD-10-CM

## 2013-12-27 DIAGNOSIS — E785 Hyperlipidemia, unspecified: Secondary | ICD-10-CM | POA: Diagnosis present

## 2013-12-27 DIAGNOSIS — Z7901 Long term (current) use of anticoagulants: Secondary | ICD-10-CM

## 2013-12-27 DIAGNOSIS — I4891 Unspecified atrial fibrillation: Secondary | ICD-10-CM

## 2013-12-27 DIAGNOSIS — Z9861 Coronary angioplasty status: Secondary | ICD-10-CM

## 2013-12-27 DIAGNOSIS — J439 Emphysema, unspecified: Secondary | ICD-10-CM

## 2013-12-27 LAB — BASIC METABOLIC PANEL
ANION GAP: 11 (ref 5–15)
BUN: 26 mg/dL — ABNORMAL HIGH (ref 6–23)
CALCIUM: 9.4 mg/dL (ref 8.4–10.5)
CO2: 30 meq/L (ref 19–32)
Chloride: 103 mEq/L (ref 96–112)
Creatinine, Ser: 1.27 mg/dL — ABNORMAL HIGH (ref 0.50–1.10)
GFR calc Af Amer: 44 mL/min — ABNORMAL LOW (ref >90)
GFR calc non Af Amer: 38 mL/min — ABNORMAL LOW (ref >90)
Glucose, Bld: 105 mg/dL — ABNORMAL HIGH (ref 70–99)
Potassium: 4 mEq/L (ref 3.7–5.3)
Sodium: 144 mEq/L (ref 137–147)

## 2013-12-27 LAB — CBC
HCT: 36 % (ref 36.0–46.0)
Hemoglobin: 11.4 g/dL — ABNORMAL LOW (ref 12.0–15.0)
MCH: 30.7 pg (ref 26.0–34.0)
MCHC: 31.7 g/dL (ref 30.0–36.0)
MCV: 97 fL (ref 78.0–100.0)
PLATELETS: 245 10*3/uL (ref 150–400)
RBC: 3.71 MIL/uL — AB (ref 3.87–5.11)
RDW: 14.5 % (ref 11.5–15.5)
WBC: 7.4 10*3/uL (ref 4.0–10.5)

## 2013-12-27 LAB — URINALYSIS, ROUTINE W REFLEX MICROSCOPIC
Bilirubin Urine: NEGATIVE
GLUCOSE, UA: NEGATIVE mg/dL
Hgb urine dipstick: NEGATIVE
KETONES UR: NEGATIVE mg/dL
Nitrite: NEGATIVE
Protein, ur: NEGATIVE mg/dL
SPECIFIC GRAVITY, URINE: 1.011 (ref 1.005–1.030)
Urobilinogen, UA: 0.2 mg/dL (ref 0.0–1.0)
pH: 5 (ref 5.0–8.0)

## 2013-12-27 LAB — URINE MICROSCOPIC-ADD ON

## 2013-12-27 LAB — I-STAT TROPONIN, ED: Troponin i, poc: 0.03 ng/mL (ref 0.00–0.08)

## 2013-12-27 LAB — PRO B NATRIURETIC PEPTIDE: Pro B Natriuretic peptide (BNP): 913.7 pg/mL — ABNORMAL HIGH (ref 0–450)

## 2013-12-27 MED ORDER — FUROSEMIDE 10 MG/ML IJ SOLN
40.0000 mg | Freq: Once | INTRAMUSCULAR | Status: DC
  Filled 2013-12-27: qty 4

## 2013-12-27 MED ORDER — ACETAMINOPHEN 325 MG PO TABS
650.0000 mg | ORAL_TABLET | Freq: Once | ORAL | Status: AC
  Administered 2013-12-27: 650 mg via ORAL
  Filled 2013-12-27: qty 2

## 2013-12-27 NOTE — ED Notes (Signed)
Dr. Glick at bedside.  

## 2013-12-27 NOTE — ED Notes (Signed)
Pt reports progressively worsening SOB since yesterday. Pt states she has increased Lasix by 40 mg today with no relief. States 4lb weight gain since yesterday. Pt noted to have +2 pitting edema to bilateral lower legs and ankles. Pt reports intermittent central chest "tightness x months. States "I will get it once in awhile, but I take my nitro and it goes away." Pt last used nitro last week. Pt denies chest pain at this time.

## 2013-12-27 NOTE — ED Notes (Signed)
Pt states swelling in her lower legs and CP starting yesterday.  Pt states this happens about once a month.  Pt states taking increased lasix to help with swelling

## 2013-12-27 NOTE — ED Provider Notes (Signed)
80 year old female with history of congestive heart failure has been having progressive swelling of her legs and progressive dyspnea over the last several days. She denies chest pain, heaviness, tightness, pressure. She tried taking an extra dose of furosemide last night but it does not help. On exam, there is neck vein distention at 90. Lungs are clear, heart has regular rate and rhythm. Extremities have 2+ edema with no presacral edema. Old records are reviewed and she has been admitted in the past for similar degrees of heart failure. We'll discuss with the hospitalist regarding possible observation admission. In the meantime, she is given a dose of furosemide intravenously. Case is discussed with Dr. Hal Hope of [facility] who agrees to admit the patient under observation status.  I saw and evaluated the patient, reviewed the resident's note and I agree with the findings and plan.   EKG Interpretation   Date/Time:  Friday December 27 2013 20:22:47 EDT Ventricular Rate:  56 PR Interval:  196 QRS Duration: 142 QT Interval:  504 QTC Calculation: 486 R Axis:   84 Text Interpretation:  Sinus bradycardia Right bundle branch block Abnormal  ECG When compared with ECG of 02/10/2013, No significant change was found  Confirmed by [facility]  MD, Aura Bibby ([id number]) on 12/27/2013 11:48:14 PM        Delora Fuel, MD 42/35/36 [id number]

## 2013-12-28 ENCOUNTER — Encounter (HOSPITAL_COMMUNITY): Payer: Self-pay | Admitting: Internal Medicine

## 2013-12-28 DIAGNOSIS — I059 Rheumatic mitral valve disease, unspecified: Secondary | ICD-10-CM

## 2013-12-28 DIAGNOSIS — I4891 Unspecified atrial fibrillation: Secondary | ICD-10-CM

## 2013-12-28 DIAGNOSIS — I509 Heart failure, unspecified: Secondary | ICD-10-CM | POA: Diagnosis present

## 2013-12-28 DIAGNOSIS — N183 Chronic kidney disease, stage 3 unspecified: Secondary | ICD-10-CM

## 2013-12-28 DIAGNOSIS — E1129 Type 2 diabetes mellitus with other diabetic kidney complication: Secondary | ICD-10-CM

## 2013-12-28 DIAGNOSIS — J438 Other emphysema: Secondary | ICD-10-CM

## 2013-12-28 LAB — COMPREHENSIVE METABOLIC PANEL
ALBUMIN: 3.2 g/dL — AB (ref 3.5–5.2)
ALK PHOS: 76 U/L (ref 39–117)
ALT: 20 U/L (ref 0–35)
AST: 20 U/L (ref 0–37)
Anion gap: 13 (ref 5–15)
BILIRUBIN TOTAL: 0.4 mg/dL (ref 0.3–1.2)
BUN: 25 mg/dL — AB (ref 6–23)
CHLORIDE: 100 meq/L (ref 96–112)
CO2: 31 mEq/L (ref 19–32)
CREATININE: 1.25 mg/dL — AB (ref 0.50–1.10)
Calcium: 9.1 mg/dL (ref 8.4–10.5)
GFR calc non Af Amer: 39 mL/min — ABNORMAL LOW (ref >90)
GFR, EST AFRICAN AMERICAN: 45 mL/min — AB (ref >90)
GLUCOSE: 108 mg/dL — AB (ref 70–99)
POTASSIUM: 3.3 meq/L — AB (ref 3.7–5.3)
Sodium: 144 mEq/L (ref 137–147)
TOTAL PROTEIN: 6.4 g/dL (ref 6.0–8.3)

## 2013-12-28 LAB — GLUCOSE, CAPILLARY
GLUCOSE-CAPILLARY: 98 mg/dL (ref 70–99)
Glucose-Capillary: 99 mg/dL (ref 70–99)
Glucose-Capillary: 137 mg/dL — ABNORMAL HIGH (ref 70–99)
Glucose-Capillary: 185 mg/dL — ABNORMAL HIGH (ref 70–99)

## 2013-12-28 LAB — PROTIME-INR
INR: 1.41 (ref 0.00–1.49)
PROTHROMBIN TIME: 17.3 s — AB (ref 11.6–15.2)

## 2013-12-28 LAB — CBC WITH DIFFERENTIAL/PLATELET
BASOS ABS: 0.1 10*3/uL (ref 0.0–0.1)
BASOS PCT: 1 % (ref 0–1)
Eosinophils Absolute: 0.2 10*3/uL (ref 0.0–0.7)
Eosinophils Relative: 3 % (ref 0–5)
HCT: 34.4 % — ABNORMAL LOW (ref 36.0–46.0)
Hemoglobin: 10.6 g/dL — ABNORMAL LOW (ref 12.0–15.0)
Lymphocytes Relative: 21 % (ref 12–46)
Lymphs Abs: 1.3 10*3/uL (ref 0.7–4.0)
MCH: 29.8 pg (ref 26.0–34.0)
MCHC: 30.8 g/dL (ref 30.0–36.0)
MCV: 96.6 fL (ref 78.0–100.0)
Monocytes Absolute: 0.4 10*3/uL (ref 0.1–1.0)
Monocytes Relative: 7 % (ref 3–12)
NEUTROS PCT: 68 % (ref 43–77)
Neutro Abs: 4.2 10*3/uL (ref 1.7–7.7)
PLATELETS: 213 10*3/uL (ref 150–400)
RBC: 3.56 MIL/uL — ABNORMAL LOW (ref 3.87–5.11)
RDW: 14.3 % (ref 11.5–15.5)
WBC: 6.1 10*3/uL (ref 4.0–10.5)

## 2013-12-28 LAB — TROPONIN I: Troponin I: <0.3 ng/mL (ref <0.30)

## 2013-12-28 LAB — HEMOGLOBIN A1C
HEMOGLOBIN A1C: 7 % — AB (ref <5.7)
Mean Plasma Glucose: 154 mg/dL — ABNORMAL HIGH (ref <117)

## 2013-12-28 LAB — TSH: TSH: 2.1 u[IU]/mL (ref 0.350–4.500)

## 2013-12-28 MED ORDER — INSULIN DETEMIR 100 UNIT/ML ~~LOC~~ SOLN
20.0000 [IU] | Freq: Every morning | SUBCUTANEOUS | Status: DC
  Filled 2013-12-28: qty 0.2

## 2013-12-28 MED ORDER — ASPIRIN 81 MG PO CHEW
81.0000 mg | CHEWABLE_TABLET | Freq: Every day | ORAL | Status: DC
  Administered 2013-12-28 – 2013-12-30 (×3): 81 mg via ORAL
  Filled 2013-12-28: qty 1

## 2013-12-28 MED ORDER — METOPROLOL SUCCINATE 12.5 MG HALF TABLET
12.5000 mg | ORAL_TABLET | Freq: Every day | ORAL | Status: DC
  Administered 2013-12-28 – 2013-12-30 (×3): 12.5 mg via ORAL
  Filled 2013-12-28 (×3): qty 1

## 2013-12-28 MED ORDER — INSULIN DETEMIR 100 UNIT/ML ~~LOC~~ SOLN
15.0000 [IU] | Freq: Every morning | SUBCUTANEOUS | Status: DC
  Administered 2013-12-29 – 2013-12-30 (×2): 15 [IU] via SUBCUTANEOUS
  Filled 2013-12-28 (×2): qty 0.15

## 2013-12-28 MED ORDER — PHYTONADIONE 5 MG PO TABS
2.5000 mg | ORAL_TABLET | Freq: Once | ORAL | Status: DC
  Filled 2013-12-28: qty 1

## 2013-12-28 MED ORDER — WARFARIN SODIUM 6 MG PO TABS
6.0000 mg | ORAL_TABLET | Freq: Once | ORAL | Status: AC
  Administered 2013-12-28: 6 mg via ORAL
  Filled 2013-12-28: qty 1

## 2013-12-28 MED ORDER — SODIUM CHLORIDE 0.9 % IJ SOLN
3.0000 mL | Freq: Two times a day (BID) | INTRAMUSCULAR | Status: DC
Start: 2013-12-28 — End: 2013-12-30
  Administered 2013-12-28 – 2013-12-30 (×6): 3 mL via INTRAVENOUS

## 2013-12-28 MED ORDER — SODIUM CHLORIDE 0.9 % IJ SOLN
3.0000 mL | Freq: Two times a day (BID) | INTRAMUSCULAR | Status: DC
  Administered 2013-12-28 – 2013-12-30 (×3): 3 mL via INTRAVENOUS

## 2013-12-28 MED ORDER — NIFEDIPINE ER 30 MG PO TB24
30.0000 mg | ORAL_TABLET | Freq: Every day | ORAL | Status: DC
  Administered 2013-12-28 – 2013-12-30 (×3): 30 mg via ORAL
  Filled 2013-12-28 (×3): qty 1

## 2013-12-28 MED ORDER — ALPRAZOLAM 0.25 MG PO TABS
0.1250 mg | ORAL_TABLET | Freq: Two times a day (BID) | ORAL | Status: DC | PRN
  Administered 2013-12-29: 0.25 mg via ORAL
  Filled 2013-12-28: qty 1

## 2013-12-28 MED ORDER — WARFARIN - PHARMACIST DOSING INPATIENT
Freq: Every day | Status: DC
  Administered 2013-12-29: 18:00:00

## 2013-12-28 MED ORDER — SIMETHICONE 80 MG PO CHEW
80.0000 mg | CHEWABLE_TABLET | Freq: Every evening | ORAL | Status: DC | PRN
  Filled 2013-12-28: qty 1

## 2013-12-28 MED ORDER — NITROGLYCERIN 0.4 MG SL SUBL: 0.4000 mg | SUBLINGUAL_TABLET | SUBLINGUAL | Status: DC | PRN

## 2013-12-28 MED ORDER — ISOSORBIDE MONONITRATE ER 60 MG PO TB24
60.0000 mg | ORAL_TABLET | Freq: Every day | ORAL | Status: DC
  Administered 2013-12-28 – 2013-12-30 (×3): 60 mg via ORAL
  Filled 2013-12-28 (×3): qty 1

## 2013-12-28 MED ORDER — SENNOSIDES-DOCUSATE SODIUM 8.6-50 MG PO TABS
1.0000 | ORAL_TABLET | Freq: Two times a day (BID) | ORAL | Status: DC
  Administered 2013-12-28 – 2013-12-29 (×4): 1 via ORAL
  Filled 2013-12-28 (×7): qty 1

## 2013-12-28 MED ORDER — OCUVITE-LUTEIN PO CAPS
1.0000 | ORAL_CAPSULE | Freq: Every day | ORAL | Status: DC
  Administered 2013-12-28 – 2013-12-30 (×3): 1 via ORAL
  Filled 2013-12-28 (×3): qty 1

## 2013-12-28 MED ORDER — GUAIFENESIN ER 600 MG PO TB12
1200.0000 mg | ORAL_TABLET | Freq: Two times a day (BID) | ORAL | Status: DC | PRN
  Filled 2013-12-28: qty 2

## 2013-12-28 MED ORDER — INSULIN ASPART 100 UNIT/ML ~~LOC~~ SOLN
0.0000 [IU] | Freq: Three times a day (TID) | SUBCUTANEOUS | Status: DC
  Administered 2013-12-28: 1 [IU] via SUBCUTANEOUS
  Administered 2013-12-28 – 2013-12-29 (×3): 2 [IU] via SUBCUTANEOUS
  Administered 2013-12-30 (×2): 1 [IU] via SUBCUTANEOUS

## 2013-12-28 MED ORDER — ATORVASTATIN CALCIUM 40 MG PO TABS
40.0000 mg | ORAL_TABLET | Freq: Every day | ORAL | Status: DC
  Administered 2013-12-28 – 2013-12-29 (×3): 40 mg via ORAL
  Filled 2013-12-28 (×4): qty 1

## 2013-12-28 MED ORDER — VITAMIN C 500 MG PO TABS
500.0000 mg | ORAL_TABLET | Freq: Every day | ORAL | Status: DC
  Administered 2013-12-28 – 2013-12-29 (×3): 500 mg via ORAL
  Filled 2013-12-28 (×4): qty 1

## 2013-12-28 MED ORDER — ONDANSETRON HCL 4 MG/2ML IJ SOLN: 4.0000 mg | Freq: Four times a day (QID) | INTRAMUSCULAR | Status: DC | PRN

## 2013-12-28 MED ORDER — PANTOPRAZOLE SODIUM 40 MG PO TBEC
40.0000 mg | DELAYED_RELEASE_TABLET | Freq: Every day | ORAL | Status: DC
  Administered 2013-12-28 – 2013-12-30 (×3): 40 mg via ORAL
  Filled 2013-12-28: qty 1

## 2013-12-28 MED ORDER — ACETAMINOPHEN 650 MG RE SUPP: 650.0000 mg | Freq: Four times a day (QID) | RECTAL | Status: DC | PRN

## 2013-12-28 MED ORDER — IPRATROPIUM-ALBUTEROL 0.5-2.5 (3) MG/3ML IN SOLN: 3.0000 mL | Freq: Four times a day (QID) | RESPIRATORY_TRACT | Status: DC | PRN

## 2013-12-28 MED ORDER — ACETAMINOPHEN 325 MG PO TABS
650.0000 mg | ORAL_TABLET | Freq: Four times a day (QID) | ORAL | Status: DC | PRN
  Administered 2013-12-29: 650 mg via ORAL
  Filled 2013-12-28: qty 2

## 2013-12-28 MED ORDER — FLUTICASONE PROPIONATE 50 MCG/ACT NA SUSP
1.0000 | Freq: Two times a day (BID) | NASAL | Status: DC
  Administered 2013-12-28 – 2013-12-30 (×5): 1 via NASAL
  Filled 2013-12-28: qty 16

## 2013-12-28 MED ORDER — AMIODARONE HCL 100 MG PO TABS
100.0000 mg | ORAL_TABLET | Freq: Every day | ORAL | Status: DC
  Administered 2013-12-28 – 2013-12-30 (×3): 100 mg via ORAL
  Filled 2013-12-28 (×3): qty 1

## 2013-12-28 MED ORDER — BUDESONIDE-FORMOTEROL FUMARATE 160-4.5 MCG/ACT IN AERO
1.0000 | INHALATION_SPRAY | Freq: Two times a day (BID) | RESPIRATORY_TRACT | Status: DC
  Administered 2013-12-28 – 2013-12-30 (×3): 1 via RESPIRATORY_TRACT
  Filled 2013-12-28 (×2): qty 6

## 2013-12-28 MED ORDER — AZELASTINE HCL 0.1 % NA SOLN
1.0000 | Freq: Two times a day (BID) | NASAL | Status: DC | PRN
  Administered 2013-12-28: 1 via NASAL
  Filled 2013-12-28: qty 30

## 2013-12-28 MED ORDER — FUROSEMIDE 10 MG/ML IJ SOLN
40.0000 mg | Freq: Two times a day (BID) | INTRAMUSCULAR | Status: DC
  Administered 2013-12-28 – 2013-12-29 (×3): 40 mg via INTRAVENOUS
  Filled 2013-12-28 (×5): qty 4

## 2013-12-28 MED ORDER — ONDANSETRON HCL 4 MG PO TABS: 4.0000 mg | ORAL_TABLET | Freq: Four times a day (QID) | ORAL | Status: DC | PRN

## 2013-12-28 MED ORDER — POTASSIUM CHLORIDE CRYS ER 10 MEQ PO TBCR
10.0000 meq | EXTENDED_RELEASE_TABLET | Freq: Every day | ORAL | Status: DC
  Administered 2013-12-28 – 2013-12-29 (×2): 10 meq via ORAL
  Filled 2013-12-28 (×2): qty 1

## 2013-12-28 NOTE — ED Notes (Signed)
MD at bedside. 

## 2013-12-28 NOTE — Progress Notes (Signed)
ANTICOAGULATION CONSULT NOTE - Follow Up Consult  Pharmacy Consult for Coumadin Indication: atrial fibrillation  Allergies  Allergen Reactions  . Atorvastatin Other (See Comments)     muscle aches  . Codeine Nausea And Vomiting  . Iohexol Other (See Comments)     Desc: unknown reaction; allergic to iodine and contrast   . Morphine Nausea And Vomiting  . Other Other (See Comments)    OPIATES cause nausea and vomiting  . Rosuvastatin Other (See Comments)    muscle aches at high doses, held as of 12/2010 due to aches    Patient Measurements: Height: '5\' 1"'  (154.9 cm) Weight: 153 lb (69.4 kg) (scale A) IBW/kg (Calculated) : 47.8  Vital Signs: Temp: 97.8 F (36.6 C) (07/04 0121) Temp src: Oral (07/04 0121) BP: 146/59 mmHg (07/04 0121) Pulse Rate: 57 (07/04 0121)  Labs:  Recent Labs  12/27/13 2046  HGB 11.4*  HCT 36.0  PLT 245  CREATININE 1.27*    Estimated Creatinine Clearance: 29.9 ml/min (by C-G formula based on Cr of 1.27).   Medications:  Facility-administered medications prior to admission  Medication Dose Route Frequency Provider Last Rate Last Dose  . phytonadione (VITAMIN K) tablet 2.5 mg  2.5 mg Oral Once Tammy Eckard, PHARMD       Prescriptions prior to admission  Medication Sig Dispense Refill  . ALPRAZolam (XANAX) 0.25 MG tablet Take 0.125-0.25 mg by mouth 2 (two) times daily as needed for anxiety.      Marland Kitchen amiodarone (PACERONE) 100 MG tablet Take 100 mg by mouth daily.      Marland Kitchen aspirin 81 MG chewable tablet Chew 81 mg by mouth daily.      Marland Kitchen atorvastatin (LIPITOR) 40 MG tablet Take 40 mg by mouth at bedtime.      . Azelastine HCl 0.15 % SOLN Place 1 spray into both nostrils 2 (two) times daily as needed (congestion).      . budesonide-formoterol (SYMBICORT) 160-4.5 MCG/ACT inhaler Inhale 1 puff into the lungs 2 (two) times daily.      . Cholecalciferol (VITAMIN D PO) Take 1 capsule by mouth daily.       . fluticasone (FLONASE) 50 MCG/ACT nasal spray Place  1 spray into both nostrils 2 (two) times daily.  16 g  6  . furosemide (LASIX) 40 MG tablet Take 40 mg by mouth See admin instructions. Take 1 tablet (40 mg) twice daily, may take an extra dose as needed for fluid buildup (3 1/2 pound weight gain)      . glucosamine-chondroitin 500-400 MG tablet Take 1 tablet by mouth 2 (two) times daily.      Marland Kitchen guaiFENesin (MUCINEX) 600 MG 12 hr tablet Take 2 tablets (1,200 mg total) by mouth 2 (two) times daily as needed for cough or to loosen phlegm.      . insulin detemir (LEVEMIR) 100 UNIT/ML injection Inject 0.2 mLs (20 Units total) into the skin every morning.  30 mL  1  . insulin lispro (HUMALOG KWIKPEN) 100 UNIT/ML KiwkPen Inject 0-8 Units into the skin 3 (three) times daily before meals. Per sliding scale      . Ipratropium-Albuterol (COMBIVENT) 20-100 MCG/ACT AERS respimat Inhale 1 puff into the lungs every 6 (six) hours as needed for wheezing or shortness of breath.       . isosorbide mononitrate (IMDUR) 60 MG 24 hr tablet Take 1 tablet (60 mg total) by mouth daily.  30 tablet  3  . metoprolol succinate (TOPROL-XL) 25 MG 24  hr tablet Take 12.5 mg by mouth daily.      . Multiple Vitamins-Minerals (PRESERVISION/LUTEIN) CAPS Take 1 capsule by mouth daily.       Marland Kitchen NIFEdipine (NIFEDICAL XL) 30 MG 24 hr tablet Take 30 mg by mouth daily.      . nitroGLYCERIN (NITROSTAT) 0.4 MG SL tablet Place 1 tablet (0.4 mg total) under the tongue every 5 (five) minutes as needed for chest pain.  25 tablet  5  . pantoprazole (PROTONIX) 40 MG tablet Take 40 mg by mouth daily.      . potassium chloride SA (K-DUR,KLOR-CON) 20 MEQ tablet Take 0.5 tablets (10 mEq total) by mouth daily.  30 tablet  1  . senna-docusate (SENOKOT-S) 8.6-50 MG per tablet Take 1 tablet by mouth 2 (two) times daily.      . simethicone (MYLICON) 80 MG chewable tablet Chew 80 mg by mouth at bedtime as needed for flatulence.      . vitamin C (ASCORBIC ACID) 500 MG tablet Take 500 mg by mouth at bedtime.        Marland Kitchen warfarin (COUMADIN) 4 MG tablet Take 4 mg by mouth daily after supper.      . Blood Glucose Monitoring Suppl (RELION ULTIMA GLUCOSE SYSTEM) W/DEVICE KIT 1 kit by Other route 6 (six) times daily. Use to check blood sugars up to six times per day as directed.    Diagnosis:  250.00 Insulin treated diabetes with history of hypoglycemia.  1 kit  0  . Glucose Blood (BLOOD GLUCOSE TEST STRIPS) STRP Use to check BG up to four times daily  200 each  5  . glucose blood test strip Relion Ultima test strips Test qid Dx 250.00  200 each  1   Scheduled:  . amiodarone  100 mg Oral Daily  . aspirin  81 mg Oral Daily  . atorvastatin  40 mg Oral QHS  . budesonide-formoterol  1 puff Inhalation BID  . fluticasone  1 spray Each Nare BID  . furosemide  40 mg Intravenous Once  . furosemide  40 mg Intravenous Q12H  . insulin aspart  0-9 Units Subcutaneous TID WC  . insulin detemir  20 Units Subcutaneous q morning - 10a  . isosorbide mononitrate  60 mg Oral Daily  . metoprolol succinate  12.5 mg Oral Daily  . multivitamin-lutein  1 capsule Oral Daily  . NIFEdipine  30 mg Oral Daily  . pantoprazole  40 mg Oral Daily  . potassium chloride SA  10 mEq Oral Daily  . senna-docusate  1 tablet Oral BID  . sodium chloride  3 mL Intravenous Q12H  . sodium chloride  3 mL Intravenous Q12H  . vitamin C  500 mg Oral QHS  . Warfarin - Pharmacist Dosing Inpatient   Does not apply q1800    Assessment/Plan:  80yo female c/o recurrent LE swelling and CP as well as SOB w/ 4# wt gain since yesterday, found w/ pitting edema, admitted for CHF exacerbation, to continue Coumadin for PAF.  Pt was subtherapeutic at most recent anticoag visit (6/15), last dose of Coumadin PTA was 7/3.  Will obtain current INR prior to resuming Coumadin.  Wynona Neat, PharmD, BCPS  12/28/2013,2:11 AM

## 2013-12-28 NOTE — Progress Notes (Signed)
Echocardiogram 2D Echocardiogram has been performed.  [name] 12/28/2013, 2:48 PM

## 2013-12-28 NOTE — Progress Notes (Signed)
ANTICOAGULATION CONSULT NOTE - Follow Up Consult  Pharmacy Consult for Coumadin Indication: atrial fibrillation  Allergies  Allergen Reactions  . Atorvastatin Other (See Comments)     muscle aches  . Codeine Nausea And Vomiting  . Iohexol Other (See Comments)     Desc: unknown reaction; allergic to iodine and contrast   . Morphine Nausea And Vomiting  . Other Other (See Comments)    OPIATES cause nausea and vomiting  . Rosuvastatin Other (See Comments)    muscle aches at high doses, held as of 12/2010 due to aches    Patient Measurements: Height: _0  (154.9 cm) Weight: 154 lb 9.6 oz (70.126 kg) (scale A) IBW/kg (Calculated) : 47.8  Vital Signs: Temp: 97.8 F (36.6 C) (07/04 1351) Temp src: Oral (07/04 1351) BP: 131/36 mmHg (07/04 1351) Pulse Rate: 98 (07/04 1351)  Labs:  Recent Labs  12/27/13 2046 12/28/13 0410 12/28/13 0750  HGB 11.4* 10.6*  --   HCT 36.0 34.4*  --   PLT 245 213  --   LABPROT  --  17.3*  --   INR  --  1.41  --   CREATININE 1.27* 1.25*  --   TROPONINI  --  <0.30 <0.30    Estimated Creatinine Clearance: 30.5 ml/min (by C-G formula based on Cr of 1.25).   Medications:  Facility-administered medications prior to admission  Medication Dose Route Frequency Provider Last Rate Last Dose  . phytonadione (VITAMIN K) tablet 2.5 mg  2.5 mg Oral Once Tammy Eckard, PHARMD       Prescriptions prior to admission  Medication Sig Dispense Refill  . ALPRAZolam (XANAX) 0.25 MG tablet Take 0.125-0.25 mg by mouth 2 (two) times daily as needed for anxiety.      Marland Kitchen amiodarone (PACERONE) 100 MG tablet Take 100 mg by mouth daily.      Marland Kitchen aspirin 81 MG chewable tablet Chew 81 mg by mouth daily.      Marland Kitchen atorvastatin (LIPITOR) 40 MG tablet Take 40 mg by mouth at bedtime.      . Azelastine HCl 0.15 % SOLN Place 1 spray into both nostrils 2 (two) times daily as needed (congestion).      . budesonide-formoterol (SYMBICORT) 160-4.5 MCG/ACT inhaler Inhale 1 puff into the  lungs 2 (two) times daily.      . Cholecalciferol (VITAMIN D PO) Take 1 capsule by mouth daily.       . fluticasone (FLONASE) 50 MCG/ACT nasal spray Place 1 spray into both nostrils 2 (two) times daily.  16 g  6  . furosemide (LASIX) 40 MG tablet Take 40 mg by mouth See admin instructions. Take 1 tablet (40 mg) twice daily, may take an extra dose as needed for fluid buildup (3 1/2 pound weight gain)      . glucosamine-chondroitin 500-400 MG tablet Take 1 tablet by mouth 2 (two) times daily.      Marland Kitchen guaiFENesin (MUCINEX) 600 MG 12 hr tablet Take 2 tablets (1,200 mg total) by mouth 2 (two) times daily as needed for cough or to loosen phlegm.      . insulin detemir (LEVEMIR) 100 UNIT/ML injection Inject 0.2 mLs (20 Units total) into the skin every morning.  30 mL  1  . insulin lispro (HUMALOG KWIKPEN) 100 UNIT/ML KiwkPen Inject 0-8 Units into the skin 3 (three) times daily before meals. Per sliding scale      . Ipratropium-Albuterol (COMBIVENT) 20-100 MCG/ACT AERS respimat Inhale 1 puff into the lungs every 6 (  six) hours as needed for wheezing or shortness of breath.       . isosorbide mononitrate (IMDUR) 60 MG 24 hr tablet Take 1 tablet (60 mg total) by mouth daily.  30 tablet  3  . metoprolol succinate (TOPROL-XL) 25 MG 24 hr tablet Take 12.5 mg by mouth daily.      . Multiple Vitamins-Minerals (PRESERVISION/LUTEIN) CAPS Take 1 capsule by mouth daily.       Marland Kitchen NIFEdipine (NIFEDICAL XL) 30 MG 24 hr tablet Take 30 mg by mouth daily.      . nitroGLYCERIN (NITROSTAT) 0.4 MG SL tablet Place 1 tablet (0.4 mg total) under the tongue every 5 (five) minutes as needed for chest pain.  25 tablet  5  . pantoprazole (PROTONIX) 40 MG tablet Take 40 mg by mouth daily.      . potassium chloride SA (K-DUR,KLOR-CON) 20 MEQ tablet Take 0.5 tablets (10 mEq total) by mouth daily.  30 tablet  1  . senna-docusate (SENOKOT-S) 8.6-50 MG per tablet Take 1 tablet by mouth 2 (two) times daily.      . simethicone (MYLICON) 80 MG  chewable tablet Chew 80 mg by mouth at bedtime as needed for flatulence.      . vitamin C (ASCORBIC ACID) 500 MG tablet Take 500 mg by mouth at bedtime.       Marland Kitchen warfarin (COUMADIN) 4 MG tablet Take 4 mg by mouth daily after supper.      . Blood Glucose Monitoring Suppl (RELION ULTIMA GLUCOSE SYSTEM) W/DEVICE KIT 1 kit by Other route 6 (six) times daily. Use to check blood sugars up to six times per day as directed.    Diagnosis:  250.00 Insulin treated diabetes with history of hypoglycemia.  1 kit  0  . Glucose Blood (BLOOD GLUCOSE TEST STRIPS) STRP Use to check BG up to four times daily  200 each  5  . glucose blood test strip Relion Ultima test strips Test qid Dx 250.00  200 each  1   Scheduled:  . amiodarone  100 mg Oral Daily  . aspirin  81 mg Oral Daily  . atorvastatin  40 mg Oral QHS  . budesonide-formoterol  1 puff Inhalation BID  . fluticasone  1 spray Each Nare BID  . furosemide  40 mg Intravenous Once  . furosemide  40 mg Intravenous Q12H  . insulin aspart  0-9 Units Subcutaneous TID WC  . [START ON 12/29/2013] insulin detemir  15 Units Subcutaneous q morning - 10a  . isosorbide mononitrate  60 mg Oral Daily  . metoprolol succinate  12.5 mg Oral Daily  . multivitamin-lutein  1 capsule Oral Daily  . NIFEdipine  30 mg Oral Daily  . pantoprazole  40 mg Oral Daily  . potassium chloride SA  10 mEq Oral Daily  . senna-docusate  1 tablet Oral BID  . sodium chloride  3 mL Intravenous Q12H  . sodium chloride  3 mL Intravenous Q12H  . vitamin C  500 mg Oral QHS  . Warfarin - Pharmacist Dosing Inpatient   Does not apply q1800    Assessment/Plan:  80yo female c/o recurrent LE swelling and CP as well as SOB w/ 4# wt gain since yesterday, found w/ pitting edema, admitted for CHF exacerbation, to continue Coumadin for PAF.  Pt was subtherapeutic at most recent anticoag visit (6/15), last dose of Coumadin PTA was 7/3.  Today 7/4 the INR is 1.41, subtherapeutic.  INR on PTA dose of 40m  daily. Hgb 10.6, pltc 213K, no bleeding reported.   Troponins negative x3 in no acute ischemic changes seen on EKG per Dr. Dyann Kief.    Goal of Therapy:  INR 2-3 Monitor platelets by anticoagulation protocol: Yes   Plan:  Give Coumadin 6 mg po today. INR daily    Nicole Cella, RPh Clinical Pharmacist Pager: [phone] 12/28/2013,1:56 PM

## 2013-12-28 NOTE — ED Provider Notes (Signed)
CSN: [id number]     Arrival date & time 12/27/13  2018 History   First MD Initiated Contact with Patient 12/27/13 2224     Chief Complaint  Patient presents with  . Chest Pain  . Leg Swelling   80 y/o female with PMH of CHF, COPD and DM presents with worsening dyspnea with no associated chest pain with increased peripheral edema and a reported weight gain of over 3 lbs in the last few days. Patient states that she tried taking an extra dose of her home lasix and this not help her symptoms, prompting her to present to the emergency department. She states that she has has similar episodes in the past in which he is much improved after IV lasix. She denies cough/congestion, fever/chills.   (Consider location/radiation/quality/duration/timing/severity/associated sxs/prior Treatment) Patient is a 80 y.o. female presenting with shortness of breath.  Shortness of Breath Severity:  Moderate Onset quality:  Gradual Timing:  Constant Progression:  Worsening Chronicity:  Recurrent Associated symptoms: no abdominal pain, no chest pain, no cough, no headaches and no vomiting     Past Medical History  Diagnosis Date  . Hyperlipidemia   . Hypertension   . Anxiety   . GERD (gastroesophageal reflux disease)   . CAD (coronary artery disease)     Cath in 2006 showed an occluded RCA with left-to-right collaterals & otherwise noncritical CAD with EF=25-30% at that time. EF subsequently improved to normal by 2D ECHO. Cath Aug. 2011 showed unchanged anatomy. > medical therapy recommended  . Diastolic dysfunction     Grade II  . RBBB (right bundle branch block)     Chronic  . Paroxysmal atrial fibrillation   . Hypoxemic respiratory failure, chronic     uses 2.5 liters of oxygen with sleep  . COPD (chronic obstructive pulmonary disease)     GOLD 4 - PFT 06/08/10 FEV1 0.93 (62%), FEV1% 60, TLC 2.84 (69%0, DLCO 54%, +BD) On home O2.  . Secondary pulmonary hypertension   . Edema   . Angina   . Shortness  of breath   . CHF (congestive heart failure)   . Diabetes mellitus     Type II - goal A1C is 8, to avoid hypoglycemia  . Bradycardia 08/24/2011  . Carotid artery disease     Moderate, right greater than left which we following by duplex ultrasound. US 12/05/11 = RIght Bulb/Prox ICA: Moderate to severe amt of fibrous plaque elevating velocities w/in prox segment of ICA. Consistent w/a 50-69% diameter reduction. Left Bulb/Prox ICA: Moderate amt of fibrous plaque slightly elevating velocities w/in the prox segment of the ICA. Consistent w/a 0-49% diameter reduction.   . PAF (paroxysmal atrial fibrillation)     In the past, on coumadin  . CHF (congestive heart failure)     Hospitalized 08/22/11-08/26/11 with CHF secondary to diastolic dysfunction & hospitilized in 07/2012 with a similar problem. TTE 08/23/11 = normal EF.  . History of stress test 07/02/09    Mild perfusion defect seen in the Basal Inferior region(s). This is consistent with an infarct/scar.  Post-stress EF=56%. Global LV systolic function is normal.  EKG is negative for ischemia.  No significant iscemia detected. Low risk scan.  . Lower extremity edema     ECHO 07/21/12 = EF 55-60%, performed for TIA. Responding to diurectics. Continue diuresis w/ a goal dry weight of <160lb. Venous Duplex 07/27/11 = Right lower extremity: no evidence of thrombus or thrombophlebitis. Essentially normal right lower extremity venous duplex Doppler evaluation.  .   Carotid bruit   . Mitral valve regurgitation     Mild to moderate by ECHO 07/21/12  . Sleep-related hypoventilation   . COPD with emphysema   . Coronary atherosclerosis of unspecified type of vessel, native or graft    Past Surgical History  Procedure Laterality Date  . Rotator cuff repair  07/2005    right  . Abdominal hysterectomy    . Cardiac catheterization  2006 & 2011    Cath in 2006 showed an occluded RCA with left-to-right collaterals & otherwise noncritical CAD with EF=25-30% at that time.  EF subsequently improved to normal by 2D ECHO. Cath Aug. 2011 showed unchanged anatomy. > medical therapy recommended.  . Coronary angioplasty    . Coronary angioplasty with stent placement      x2  . Carotid duplex  12/05/11    Moderate, right greater than left which we following by duplex ultrasound. [place] 12/05/11 = RIght Bulb/Prox ICA: Moderate to severe amt of fibrous plaque elevating velocities w/in prox segment of ICA. Consistent w/a 50-69% diameter reduction. Left Bulb/Prox ICA: Moderate amt of fibrous plaque slightly elevating velocities w/in the prox segment of the ICA. Consistent w/a 0-49% diameter reduction.   . Venous duplex  07/27/11    Venous Duplex 07/27/11 = Right lower extremity: no evidence of thrombus or thrombophlebitis. Essentially normal right lower extremity venous duplex Doppler evaluation.   Family History  Problem Relation Age of Onset  . Asthma Mother   . Cancer Mother     Skin  . Heart disease Mother   . Asthma Father   . Emphysema Father   . Asthma Brother   . Asthma Brother    History  Substance Use Topics  . Smoking status: Former Smoker -- 1.50 packs/day for 15 years    Types: Cigarettes    Quit date: 06/27/1993  . Smokeless tobacco: Never Used     Comment: Smoked 1.5 packs per day for 15 years, quit in 1995.  Marland Kitchen Alcohol Use: Yes     Comment: Occasional, 1-2 glasses of wine daily   OB History   Grav Para Term Preterm Abortions TAB SAB Ect Mult Living                 Review of Systems  Constitutional: Negative for activity change.  HENT: Negative for congestion.   Respiratory: Positive for shortness of breath. Negative for cough.   Cardiovascular: Positive for leg swelling. Negative for chest pain.  Gastrointestinal: Negative for nausea, vomiting, abdominal pain, diarrhea, constipation, blood in stool and abdominal distention.  Genitourinary: Negative for dysuria, flank pain and vaginal discharge.  Musculoskeletal: Negative for back pain.  Skin:  Negative for color change.  Neurological: Negative for syncope and headaches.  Psychiatric/Behavioral: Negative for agitation.      Allergies  Atorvastatin; Codeine; Iohexol; Morphine; Other; and Rosuvastatin  Home Medications   Prior to Admission medications   Medication Sig Start Date End Date Taking? Authorizing Provider  ALPRAZolam (XANAX) 0.25 MG tablet Take 0.125-0.25 mg by mouth 2 (two) times daily as needed for anxiety.   Yes Historical Provider, MD  amiodarone (PACERONE) 100 MG tablet Take 100 mg by mouth daily.   Yes Historical Provider, MD  aspirin 81 MG chewable tablet Chew 81 mg by mouth daily.   Yes Historical Provider, MD  atorvastatin (LIPITOR) 40 MG tablet Take 40 mg by mouth at bedtime.   Yes Historical Provider, MD  Azelastine HCl 0.15 % SOLN Place 1 spray into both nostrils 2 (  two) times daily as needed (congestion).   Yes Historical Provider, MD  budesonide-formoterol (SYMBICORT) 160-4.5 MCG/ACT inhaler Inhale 1 puff into the lungs 2 (two) times daily. 08/26/12  Yes Graham S Duncan, MD  Cholecalciferol (VITAMIN D PO) Take 1 capsule by mouth daily.    Yes Historical Provider, MD  fluticasone (FLONASE) 50 MCG/ACT nasal spray Place 1 spray into both nostrils 2 (two) times daily. 12/24/13  Yes Vineet Sood, MD  furosemide (LASIX) 40 MG tablet Take 40 mg by mouth See admin instructions. Take 1 tablet (40 mg) twice daily, may take an extra dose as needed for fluid buildup (3 1/2 pound weight gain)   Yes Historical Provider, MD  glucosamine-chondroitin 500-400 MG tablet Take 1 tablet by mouth 2 (two) times daily.   Yes Historical Provider, MD  guaiFENesin (MUCINEX) 600 MG 12 hr tablet Take 2 tablets (1,200 mg total) by mouth 2 (two) times daily as needed for cough or to loosen phlegm. 12/17/13  Yes Vineet Sood, MD  insulin detemir (LEVEMIR) 100 UNIT/ML injection Inject 0.2 mLs (20 Units total) into the skin every morning. 12/04/13  Yes Mary-Margaret Martin, FNP  insulin lispro  (HUMALOG KWIKPEN) 100 UNIT/ML KiwkPen Inject 0-8 Units into the skin 3 (three) times daily before meals. Per sliding scale   Yes Historical Provider, MD  Ipratropium-Albuterol (COMBIVENT) 20-100 MCG/ACT AERS respimat Inhale 1 puff into the lungs every 6 (six) hours as needed for wheezing or shortness of breath.  10/10/12  Yes Vineet Sood, MD  isosorbide mononitrate (IMDUR) 60 MG 24 hr tablet Take 1 tablet (60 mg total) by mouth daily. 11/27/13  Yes Mary-Margaret Martin, FNP  metoprolol succinate (TOPROL-XL) 25 MG 24 hr tablet Take 12.5 mg by mouth daily.   Yes Historical Provider, MD  Multiple Vitamins-Minerals (PRESERVISION/LUTEIN) CAPS Take 1 capsule by mouth daily.    Yes Historical Provider, MD  NIFEdipine (NIFEDICAL XL) 30 MG 24 hr tablet Take 30 mg by mouth daily.   Yes Historical Provider, MD  nitroGLYCERIN (NITROSTAT) 0.4 MG SL tablet Place 1 tablet (0.4 mg total) under the tongue every 5 (five) minutes as needed for chest pain. 02/28/13  Yes William J Oxford, FNP  pantoprazole (PROTONIX) 40 MG tablet Take 40 mg by mouth daily.   Yes Historical Provider, MD  potassium chloride SA (K-DUR,KLOR-CON) 20 MEQ tablet Take 0.5 tablets (10 mEq total) by mouth daily. 12/11/13  Yes Tammy Eckard, PHARMD  senna-docusate (SENOKOT-S) 8.6-50 MG per tablet Take 1 tablet by mouth 2 (two) times daily.   Yes Historical Provider, MD  simethicone (MYLICON) 80 MG chewable tablet Chew 80 mg by mouth at bedtime as needed for flatulence.   Yes Historical Provider, MD  vitamin C (ASCORBIC ACID) 500 MG tablet Take 500 mg by mouth at bedtime.    Yes Historical Provider, MD  warfarin (COUMADIN) 4 MG tablet Take 4 mg by mouth daily after supper.   Yes Historical Provider, MD  Blood Glucose Monitoring Suppl (RELION ULTIMA GLUCOSE SYSTEM) W/DEVICE KIT 1 kit by Other route 6 (six) times daily. Use to check blood sugars up to six times per day as directed.    Diagnosis:  250.00 Insulin treated diabetes with history of hypoglycemia.  12/31/12   Graham S Duncan, MD  Glucose Blood (BLOOD GLUCOSE TEST STRIPS) STRP Use to check BG up to four times daily 08/21/13   Tammy Eckard, PHARMD  glucose blood test strip Relion Ultima test strips Test qid Dx 250.00 07/05/13   William J Oxford, FNP     BP 146/59  Pulse 57  Temp(Src) 97.8 F (36.6 C) (Oral)  Resp 20  Ht 5' 1" (1.549 m)  Wt 153 lb (69.4 kg)  BMI 28.92 kg/m2  SpO2 100% Physical Exam  Constitutional: She is oriented to person, place, and time. She appears well-developed.  HENT:  Head: Normocephalic.  Eyes: Pupils are equal, round, and reactive to light.  Neck: Neck supple.  Cardiovascular: Normal rate.  Exam reveals no gallop and no friction rub.   No murmur heard. Pulmonary/Chest: Effort normal and breath sounds normal. No respiratory distress. She has no rales.  Abdominal: Soft. She exhibits no distension. There is no tenderness. There is no rebound.  Musculoskeletal: She exhibits edema.  Neurological: She is alert and oriented to person, place, and time.  Skin: Skin is warm.  Psychiatric: She has a normal mood and affect.    ED Course  Procedures (including critical care time) Labs Review Labs Reviewed  CBC - Abnormal; Notable for the following:    RBC 3.71 (*)    Hemoglobin 11.4 (*)    All other components within normal limits  BASIC METABOLIC PANEL - Abnormal; Notable for the following:    Glucose, Bld 105 (*)    BUN 26 (*)    Creatinine, Ser 1.27 (*)    GFR calc non Af Amer 38 (*)    GFR calc Af Amer 44 (*)    All other components within normal limits  PRO B NATRIURETIC PEPTIDE - Abnormal; Notable for the following:    Pro B Natriuretic peptide (BNP) 913.7 (*)    All other components within normal limits  URINALYSIS, ROUTINE W REFLEX MICROSCOPIC - Abnormal; Notable for the following:    Leukocytes, UA SMALL (*)    All other components within normal limits  URINE MICROSCOPIC-ADD ON - Abnormal; Notable for the following:    Bacteria, UA FEW (*)     Casts HYALINE CASTS (*)    All other components within normal limits  GLUCOSE, CAPILLARY  TROPONIN I  TROPONIN I  TROPONIN I  COMPREHENSIVE METABOLIC PANEL  CBC WITH DIFFERENTIAL  TSH  PROTIME-INR  I-STAT TROPOININ, ED    Imaging Review Dg Chest Port 1 View  12/27/2013   CLINICAL DATA:  History of COPD and CHF and diabetes now with chest pain and shortness of breath  EXAM: PORTABLE CHEST - 1 VIEW  COMPARISON:  PA and lateral chest x-ray of December 17, 2013.  FINDINGS: The lungs are mildly hyperinflated. There is no focal pneumonia. The cardiac silhouette is enlarged. The central pulmonary vascularity is prominent. There is no pleural effusion. There are degenerative changes of the right shoulder.  IMPRESSION: COPD and low-grade compensated CHF.  There is no pneumonia.   Electronically Signed   By: David  Jordan   On: 12/27/2013 21:05     EKG Interpretation   Date/Time:  Friday December 27 2013 20:22:47 EDT Ventricular Rate:  56 PR Interval:  196 QRS Duration: 142 QT Interval:  504 QTC Calculation: 486 R Axis:   84 Text Interpretation:  Sinus bradycardia Right bundle branch block Abnormal  ECG When compared with ECG of 02/10/2013, No significant change was found  Confirmed by GLICK  MD, DAVID ([id number]) on 12/27/2013 11:48:14 PM      MDM   Final diagnoses:  CHF exacerbation   80 y/o female presents with signs of CHF exacerbation. The patient states that she has attempted increasing her dose of home lasix with out any benefit and continues to   have SOB and peripheral edema. On review of records the patient has been admitted for similar symptoms in the past and improved greatly after IV lasix and observation.  Patient evaluated with CBC, BNP, BMP,EKG and CXR.  BNP is elevated but with in the norm for the patient, the patient does have some signs of mild pulmonary edema on CXR.  Patient was admitted for an observation admission in which she will be diureses. The patient and family agree with  this disposition plan and all questions were answered.      Claudean Severance, MD 12/28/13 1309

## 2013-12-28 NOTE — H&P (Signed)
[facility] History and Physical  [name] VZD:[id number] DOB: 1928/02/12 DOA: 12/27/2013  Referring physician: ER physician. PCP: Redge Gainer, MD   Chief Complaint: Shortness of breath.  HPI: [name] is a 80 y.o. female with history of CHF last EF measured was 55-60% on January 2014, COPD on home oxygen, diabetes mellitus, atrial fibrillation, peripheral vascular disease presents to the ER because of increasing shortness of breath over the last 2 days. Patient states that in addition patient also has been having some chest pressure off and on which gets better with nitroglycerin. In the ER patient had chest x-ray which shows COPD with compensated CHF. EKG shows sinus bradycardia with RBBB. Patient was given Lasix 40 mg IV. Patient also had taken an extra dose of Lasix at home. Patient has been admitted for CHF exacerbation. Patient denies any productive cough fever chills nausea vomiting abdominal pain diarrhea. Patient also noticed some swelling in her lower extremities.  Review of Systems: As presented in the history of presenting illness, rest negative.  Past Medical History  Diagnosis Date  . Hyperlipidemia   . Hypertension   . Anxiety   . GERD (gastroesophageal reflux disease)   . CAD (coronary artery disease)     Cath in 2006 showed an occluded RCA with left-to-right collaterals & otherwise noncritical CAD with EF=25-30% at that time. EF subsequently improved to normal by 2D ECHO. Cath Aug. 2011 showed unchanged anatomy. > medical therapy recommended  . Diastolic dysfunction     Grade II  . RBBB (right bundle branch block)     Chronic  . Paroxysmal atrial fibrillation   . Hypoxemic respiratory failure, chronic     uses 2.5 liters of oxygen with sleep  . COPD (chronic obstructive pulmonary disease)     GOLD 4 - PFT 06/08/10 FEV1 0.93 (62%), FEV1% 60, TLC 2.84 (69%0, DLCO 54%, +BD) On home O2.  . Secondary pulmonary hypertension   . Edema   . Angina   .  Shortness of breath   . CHF (congestive heart failure)   . Diabetes mellitus     Type II - goal A1C is 8, to avoid hypoglycemia  . Bradycardia 08/24/2011  . Carotid artery disease     Moderate, right greater than left which we following by duplex ultrasound. [place] 12/05/11 = RIght Bulb/Prox ICA: Moderate to severe amt of fibrous plaque elevating velocities w/in prox segment of ICA. Consistent w/a 50-69% diameter reduction. Left Bulb/Prox ICA: Moderate amt of fibrous plaque slightly elevating velocities w/in the prox segment of the ICA. Consistent w/a 0-49% diameter reduction.   Marland Kitchen PAF (paroxysmal atrial fibrillation)     In the past, on coumadin  . CHF (congestive heart failure)     Hospitalized 08/22/11-08/26/11 with CHF secondary to diastolic dysfunction & hospitilized in 07/2012 with a similar problem. TTE 08/23/11 = normal EF.  Marland Kitchen History of stress test 07/02/09    Mild perfusion defect seen in the Basal Inferior region(s). This is consistent with an infarct/scar.  Post-stress EF=56%. Global LV systolic function is normal.  EKG is negative for ischemia.  No significant iscemia detected. Low risk scan.  . Lower extremity edema     ECHO 07/21/12 = EF 55-60%, performed for TIA. Responding to diurectics. Continue diuresis w/ a goal dry weight of <160lb. Venous Duplex 07/27/11 = Right lower extremity: no evidence of thrombus or thrombophlebitis. Essentially normal right lower extremity venous duplex Doppler evaluation.  . Carotid bruit   . Mitral valve regurgitation  Mild to moderate by ECHO 07/21/12  . Sleep-related hypoventilation   . COPD with emphysema   . Coronary atherosclerosis of unspecified type of vessel, native or graft    Past Surgical History  Procedure Laterality Date  . Rotator cuff repair  07/2005    right  . Abdominal hysterectomy    . Cardiac catheterization  2006 & 2011    Cath in 2006 showed an occluded RCA with left-to-right collaterals & otherwise noncritical CAD with EF=25-30% at  that time. EF subsequently improved to normal by 2D ECHO. Cath Aug. 2011 showed unchanged anatomy. > medical therapy recommended.  . Coronary angioplasty    . Coronary angioplasty with stent placement      x2  . Carotid duplex  12/05/11    Moderate, right greater than left which we following by duplex ultrasound. [place] 12/05/11 = RIght Bulb/Prox ICA: Moderate to severe amt of fibrous plaque elevating velocities w/in prox segment of ICA. Consistent w/a 50-69% diameter reduction. Left Bulb/Prox ICA: Moderate amt of fibrous plaque slightly elevating velocities w/in the prox segment of the ICA. Consistent w/a 0-49% diameter reduction.   . Venous duplex  07/27/11    Venous Duplex 07/27/11 = Right lower extremity: no evidence of thrombus or thrombophlebitis. Essentially normal right lower extremity venous duplex Doppler evaluation.   Social History:  reports that she quit smoking about 20 years ago. Her smoking use included Cigarettes. She has a 22.5 pack-year smoking history. She has never used smokeless tobacco. She reports that she drinks alcohol. She reports that she does not use illicit drugs. Where does patient live home. Can patient participate in ADLs? Yes.  Allergies  Allergen Reactions  . Atorvastatin Other (See Comments)     muscle aches  . Codeine Nausea And Vomiting  . Iohexol Other (See Comments)     Desc: unknown reaction; allergic to iodine and contrast   . Morphine Nausea And Vomiting  . Other Other (See Comments)    OPIATES cause nausea and vomiting  . Rosuvastatin Other (See Comments)    muscle aches at high doses, held as of 12/2010 due to aches    Family History:  Family History  Problem Relation Age of Onset  . Asthma Mother   . Cancer Mother     Skin  . Heart disease Mother   . Asthma Father   . Emphysema Father   . Asthma Brother   . Asthma Brother       Prior to Admission medications   Medication Sig Start Date End Date Taking? Authorizing Provider  ALPRAZolam  (XANAX) 0.25 MG tablet Take 0.125-0.25 mg by mouth 2 (two) times daily as needed for anxiety.   Yes Historical Provider, MD  amiodarone (PACERONE) 100 MG tablet Take 100 mg by mouth daily.   Yes Historical Provider, MD  aspirin 81 MG chewable tablet Chew 81 mg by mouth daily.   Yes Historical Provider, MD  atorvastatin (LIPITOR) 40 MG tablet Take 40 mg by mouth at bedtime.   Yes Historical Provider, MD  Azelastine HCl 0.15 % SOLN Place 1 spray into both nostrils 2 (two) times daily as needed (congestion).   Yes Historical Provider, MD  budesonide-formoterol (SYMBICORT) 160-4.5 MCG/ACT inhaler Inhale 1 puff into the lungs 2 (two) times daily. 08/26/12  Yes Tonia Ghent, MD  Cholecalciferol (VITAMIN D PO) Take 1 capsule by mouth daily.    Yes Historical Provider, MD  fluticasone (FLONASE) 50 MCG/ACT nasal spray Place 1 spray into both nostrils 2 (  two) times daily. 12/24/13  Yes Chesley Mires, MD  furosemide (LASIX) 40 MG tablet Take 40 mg by mouth See admin instructions. Take 1 tablet (40 mg) twice daily, may take an extra dose as needed for fluid buildup (3 1/2 pound weight gain)   Yes Historical Provider, MD  glucosamine-chondroitin 500-400 MG tablet Take 1 tablet by mouth 2 (two) times daily.   Yes Historical Provider, MD  guaiFENesin (MUCINEX) 600 MG 12 hr tablet Take 2 tablets (1,200 mg total) by mouth 2 (two) times daily as needed for cough or to loosen phlegm. 12/17/13  Yes Chesley Mires, MD  insulin detemir (LEVEMIR) 100 UNIT/ML injection Inject 0.2 mLs (20 Units total) into the skin every morning. 12/04/13  Yes Mary-Margaret Hassell Done, FNP  insulin lispro (HUMALOG KWIKPEN) 100 UNIT/ML KiwkPen Inject 0-8 Units into the skin 3 (three) times daily before meals. Per sliding scale   Yes Historical Provider, MD  Ipratropium-Albuterol (COMBIVENT) 20-100 MCG/ACT AERS respimat Inhale 1 puff into the lungs every 6 (six) hours as needed for wheezing or shortness of breath.  10/10/12  Yes Chesley Mires, MD  isosorbide  mononitrate (IMDUR) 60 MG 24 hr tablet Take 1 tablet (60 mg total) by mouth daily. 11/27/13  Yes Mary-Margaret Hassell Done, FNP  metoprolol succinate (TOPROL-XL) 25 MG 24 hr tablet Take 12.5 mg by mouth daily.   Yes Historical Provider, MD  Multiple Vitamins-Minerals (PRESERVISION/LUTEIN) CAPS Take 1 capsule by mouth daily.    Yes Historical Provider, MD  NIFEdipine (NIFEDICAL XL) 30 MG 24 hr tablet Take 30 mg by mouth daily.   Yes Historical Provider, MD  nitroGLYCERIN (NITROSTAT) 0.4 MG SL tablet Place 1 tablet (0.4 mg total) under the tongue every 5 (five) minutes as needed for chest pain. 02/28/13  Yes Lysbeth Penner, FNP  pantoprazole (PROTONIX) 40 MG tablet Take 40 mg by mouth daily.   Yes Historical Provider, MD  potassium chloride SA (K-DUR,KLOR-CON) 20 MEQ tablet Take 0.5 tablets (10 mEq total) by mouth daily. 12/11/13  Yes Tammy Eckard, PHARMD  senna-docusate (SENOKOT-S) 8.6-50 MG per tablet Take 1 tablet by mouth 2 (two) times daily.   Yes Historical Provider, MD  simethicone (MYLICON) 80 MG chewable tablet Chew 80 mg by mouth at bedtime as needed for flatulence.   Yes Historical Provider, MD  vitamin C (ASCORBIC ACID) 500 MG tablet Take 500 mg by mouth at bedtime.    Yes Historical Provider, MD  warfarin (COUMADIN) 4 MG tablet Take 4 mg by mouth daily after supper.   Yes Historical Provider, MD  Blood Glucose Monitoring Suppl ([place]) W/DEVICE KIT 1 kit by Other route 6 (six) times daily. Use to check blood sugars up to six times per day as directed.    Diagnosis:  250.00 Insulin treated diabetes with history of hypoglycemia. 12/31/12   Tonia Ghent, MD  Glucose Blood (BLOOD GLUCOSE TEST STRIPS) STRP Use to check BG up to four times daily 08/21/13   Tammy Eckard, PHARMD  glucose blood test strip Relion Ultima test strips Test qid Dx 250.00 07/05/13   Lysbeth Penner, FNP    Physical Exam: Filed Vitals:   12/27/13 2330 12/28/13 0000 12/28/13 0030 12/28/13 0121  BP: 129/64  151/52 157/47 146/59  Pulse: 58 60 61 57  Temp:    97.8 F (36.6 C)  TempSrc:    Oral  Resp: _0 Height:    _1  (1.549 m)  Weight:    69.4 kg (153  lb)  SpO2: 100% 100% 100% 100%     General:  Well-developed and nourished.  Eyes: Anicteric no pallor.  ENT: No discharge from ears eyes nose mouth.  Neck: No mass felt.  Cardiovascular: S1-S2 heard.  Respiratory: No rhonchi or crepitations.  Abdomen: Soft nontender bowel sounds present. No guarding rigidity.  Skin: No rash.  Musculoskeletal: Very minimal edema in the ankles bilaterally.  Psychiatric: Appears normal.  Neurologic: Alert awake oriented to time place and person. Moves all extremities.  Labs on Admission:  Basic Metabolic Panel:  Recent Labs Lab 12/27/13 2046  NA 144  K 4.0  CL 103  CO2 30  GLUCOSE 105*  BUN 26*  CREATININE 1.27*  CALCIUM 9.4   Liver Function Tests: No results found for this basename: AST, ALT, ALKPHOS, BILITOT, PROT, ALBUMIN,  in the last 168 hours No results found for this basename: LIPASE, AMYLASE,  in the last 168 hours No results found for this basename: AMMONIA,  in the last 168 hours CBC:  Recent Labs Lab 12/27/13 2046  WBC 7.4  HGB 11.4*  HCT 36.0  MCV 97.0  PLT 245   Cardiac Enzymes: No results found for this basename: CKTOTAL, CKMB, CKMBINDEX, TROPONINI,  in the last 168 hours  BNP (last 3 results)  Recent Labs  02/10/13 1321 12/27/13 2046  PROBNP 1464.0* 913.7*   CBG:  Recent Labs Lab 12/28/13 0133  GLUCAP 99    Radiological Exams on Admission: Dg Chest Port 1 View  12/27/2013   CLINICAL DATA:  History of COPD and CHF and diabetes now with chest pain and shortness of breath  EXAM: PORTABLE CHEST - 1 VIEW  COMPARISON:  PA and lateral chest x-ray of December 17, 2013.  FINDINGS: The lungs are mildly hyperinflated. There is no focal pneumonia. The cardiac silhouette is enlarged. The central pulmonary vascularity is prominent. There is no pleural  effusion. There are degenerative changes of the right shoulder.  IMPRESSION: COPD and low-grade compensated CHF.  There is no pneumonia.   Electronically Signed   By: David  Martinique   On: 12/27/2013 21:05    EKG: Independently reviewed. Sinus bradycardia with RBBB.  Assessment/Plan Principal Problem:   CHF exacerbation Active Problems:   PAF, recurrance this admission, on chronic Amio/ Coumadin   COPD with emphysema   HTN (hypertension)   CAD, remote RCA PCI, subsequently noted to be occluded, last cath 8/11   Chronic anticoagulation, on coumadin   DM type 2 causing CKD stage 3   CHF (congestive heart failure)   1. Decompensated CHF last EF measured was 55-60% in January 2014 - I have continued patient on Lasix 40 mg IV every 12 hourly. Closely follow intake output daily weights and metabolic panel. 2. Chest pain with history of CAD - presently chest pain-free. Has cardiac catheter in 2011 which showed completely occluded RCA. Cycle cardiac markers. Nitroglycerin when necessary. Patient is on aspirin and Coumadin. 3. Atrial fibrillation - presently rate controlled and in sinus rhythm. On Coumadin and amiodarone and metoprolol. 4. COPD on home oxygen - presently not wheezing. Continue inhalers. 5. Diabetes mellitus - continue home medications with sliding-scale coverage. 6. Chronic kidney disease stage III - follow intake output and daily weights and metabolic panel. 7. Anemia - probably from kidney disease. Closely follow CBC.    Code Status: Full code.  Family Communication: None.  Disposition Plan: Admit to inpatient.    Nazly Digilio N. [facility] Pager [phone].  If 7PM-7AM, please contact night-coverage [email or web address]  Password [email or web address] 12/28/2013, 1:52 AM

## 2013-12-28 NOTE — Progress Notes (Signed)
Patient seen and examined. Admitted after midnight secondary to increase shortness of breath, lower extremity swelling and orthopnea. Elevated BNP and vascular congestion at present on a chest x-ray. For further details/info on admission please refer to H&P written by Dr. Hal Hope.  Plan: -Continue daily weights, fluid restriction, low sodium diet and a straight intake and output -Continue IV Lasix 40 mg twice a day -Basic metabolic panel on daily basis to follow renal function and electrolytes -Will repeat 2-D echo -Troponins negative x3 in no acute ischemic changes seen on EKG -Will follow clinical response.   Barton Dubois [phone]

## 2013-12-28 NOTE — ED Notes (Signed)
Called pt's daughter Santiago Glad and notified her of pt's room assignment that she is going to.

## 2013-12-29 DIAGNOSIS — I10 Essential (primary) hypertension: Secondary | ICD-10-CM

## 2013-12-29 LAB — BASIC METABOLIC PANEL
ANION GAP: 14 (ref 5–15)
BUN: 32 mg/dL — AB (ref 6–23)
CHLORIDE: 97 meq/L (ref 96–112)
CO2: 30 mEq/L (ref 19–32)
Calcium: 9.5 mg/dL (ref 8.4–10.5)
Creatinine, Ser: 1.4 mg/dL — ABNORMAL HIGH (ref 0.50–1.10)
GFR, EST AFRICAN AMERICAN: 39 mL/min — AB (ref >90)
GFR, EST NON AFRICAN AMERICAN: 34 mL/min — AB (ref >90)
Glucose, Bld: 167 mg/dL — ABNORMAL HIGH (ref 70–99)
POTASSIUM: 3.5 meq/L — AB (ref 3.7–5.3)
SODIUM: 141 meq/L (ref 137–147)

## 2013-12-29 LAB — GLUCOSE, CAPILLARY
Glucose-Capillary: 147 mg/dL — ABNORMAL HIGH (ref 70–99)
Glucose-Capillary: 115 mg/dL — ABNORMAL HIGH (ref 70–99)
Glucose-Capillary: 148 mg/dL — ABNORMAL HIGH (ref 70–99)
Glucose-Capillary: 173 mg/dL — ABNORMAL HIGH (ref 70–99)
Glucose-Capillary: 152 mg/dL — ABNORMAL HIGH (ref 70–99)

## 2013-12-29 LAB — PROTIME-INR
INR: 1.52 — AB (ref 0.00–1.49)
PROTHROMBIN TIME: 18.3 s — AB (ref 11.6–15.2)

## 2013-12-29 MED ORDER — ENOXAPARIN SODIUM 80 MG/0.8ML ~~LOC~~ SOLN
70.0000 mg | Freq: Every day | SUBCUTANEOUS | Status: DC
  Filled 2013-12-29 (×2): qty 0.8

## 2013-12-29 MED ORDER — FUROSEMIDE 40 MG PO TABS
40.0000 mg | ORAL_TABLET | Freq: Two times a day (BID) | ORAL | Status: DC
  Administered 2013-12-29 – 2013-12-30 (×2): 40 mg via ORAL
  Filled 2013-12-29 (×4): qty 1

## 2013-12-29 MED ORDER — POTASSIUM CHLORIDE CRYS ER 20 MEQ PO TBCR
20.0000 meq | EXTENDED_RELEASE_TABLET | Freq: Every day | ORAL | Status: DC
  Administered 2013-12-30: 20 meq via ORAL
  Filled 2013-12-29: qty 1

## 2013-12-29 MED ORDER — WARFARIN SODIUM 5 MG PO TABS
5.0000 mg | ORAL_TABLET | Freq: Once | ORAL | Status: AC
  Administered 2013-12-29: 5 mg via ORAL
  Filled 2013-12-29: qty 1

## 2013-12-29 NOTE — Progress Notes (Signed)
ANTICOAGULATION CONSULT NOTE - Follow Up Consult    Pharmacy Consult for Coumadin;  Add Lovenox bridge (7/5>>) Indication: atrial fibrillation  Allergies  Allergen Reactions  . Atorvastatin Other (See Comments)     muscle aches  . Codeine Nausea And Vomiting  . Morphine Nausea And Vomiting  . Other Other (See Comments)    OPIATES cause nausea and vomiting  . Rosuvastatin Other (See Comments)    muscle aches at high doses, held as of 12/2010 due to aches  . Iohexol Other (See Comments)     Desc: unknown reaction; allergic to iodine and contrast     Patient Measurements: Height: 5\' 1"  (154.9 cm) Weight: 151 lb 0.2 oz (68.5 kg) (scale a) IBW/kg (Calculated) : 47.8  Vital Signs: Temp: 97.7 F (36.5 C) (07/05 0325) Temp src: Oral (07/05 0325) BP: 146/95 mmHg (07/05 0325) Pulse Rate: 73 (07/05 0325)  Labs:  Recent Labs  12/27/13 2046 12/28/13 0410 12/28/13 0750 12/28/13 1324 12/29/13 0310  HGB 11.4* 10.6*  --   --   --   HCT 36.0 34.4*  --   --   --   PLT 245 213  --   --   --   LABPROT  --  17.3*  --   --  18.3*  INR  --  1.41  --   --  1.52*  CREATININE 1.27* 1.25*  --   --  1.40*  TROPONINI  --  <0.30 <0.30 <0.30  --     Estimated Creatinine Clearance: 27 ml/min (by C-G formula based on Cr of 1.4).   Medications:  Facility-administered medications prior to admission  Medication Dose Route Frequency Provider Last Rate Last Dose  . [DISCONTINUED] phytonadione (VITAMIN K) tablet 2.5 mg  2.5 mg Oral Once Tammy Eckard, PHARMD       Prescriptions prior to admission  Medication Sig Dispense Refill  . ALPRAZolam (XANAX) 0.25 MG tablet Take 0.125-0.25 mg by mouth 2 (two) times daily as needed for anxiety.      Marland Kitchen amiodarone (PACERONE) 100 MG tablet Take 100 mg by mouth every morning.       Marland Kitchen aspirin 81 MG chewable tablet Chew 81 mg by mouth daily.      Marland Kitchen atorvastatin (LIPITOR) 40 MG tablet Take 40 mg by mouth at bedtime.      . Azelastine HCl 0.15 % SOLN Place 1  spray into both nostrils 2 (two) times daily as needed (congestion).      . budesonide-formoterol (SYMBICORT) 160-4.5 MCG/ACT inhaler Inhale 1 puff into the lungs 2 (two) times daily.      . Cholecalciferol (VITAMIN D PO) Take 1 capsule by mouth daily.       . fluticasone (FLONASE) 50 MCG/ACT nasal spray Place 1 spray into both nostrils 2 (two) times daily.  16 g  6  . furosemide (LASIX) 40 MG tablet Take 40 mg by mouth See admin instructions. Take 1 tablet (40 mg) twice daily, may take an extra dose as needed for fluid buildup (3 1/2 pound weight gain)      . glucosamine-chondroitin 500-400 MG tablet Take 1 tablet by mouth 2 (two) times daily.      Marland Kitchen guaiFENesin (MUCINEX) 600 MG 12 hr tablet Take 2 tablets (1,200 mg total) by mouth 2 (two) times daily as needed for cough or to loosen phlegm.      . insulin detemir (LEVEMIR) 100 UNIT/ML injection Inject 0.2 mLs (20 Units total) into the skin every morning.  30 mL  1  . insulin lispro (HUMALOG KWIKPEN) 100 UNIT/ML KiwkPen Inject 0-8 Units into the skin 3 (three) times daily before meals. Per sliding scale      . Ipratropium-Albuterol (COMBIVENT) 20-100 MCG/ACT AERS respimat Inhale 1 puff into the lungs every 6 (six) hours as needed for wheezing or shortness of breath.       . isosorbide mononitrate (IMDUR) 60 MG 24 hr tablet Take 1 tablet (60 mg total) by mouth daily.  30 tablet  3  . metoprolol succinate (TOPROL-XL) 25 MG 24 hr tablet Take 12.5 mg by mouth daily.      . Multiple Vitamins-Minerals (PRESERVISION/LUTEIN) CAPS Take 1 capsule by mouth daily.       Marland Kitchen NIFEdipine (NIFEDICAL XL) 30 MG 24 hr tablet Take 30 mg by mouth daily.      . nitroGLYCERIN (NITROSTAT) 0.4 MG SL tablet Place 1 tablet (0.4 mg total) under the tongue every 5 (five) minutes as needed for chest pain.  25 tablet  5  . pantoprazole (PROTONIX) 40 MG tablet Take 40 mg by mouth daily.      . potassium chloride SA (K-DUR,KLOR-CON) 20 MEQ tablet Take 0.5 tablets (10 mEq total) by  mouth daily.  30 tablet  1  . senna-docusate (SENOKOT-S) 8.6-50 MG per tablet Take 1 tablet by mouth 2 (two) times daily.      . simethicone (MYLICON) 80 MG chewable tablet Chew 80 mg by mouth at bedtime as needed for flatulence.      . vitamin C (ASCORBIC ACID) 500 MG tablet Take 500 mg by mouth at bedtime.       Marland Kitchen warfarin (COUMADIN) 4 MG tablet Take 4 mg by mouth daily after supper.       Scheduled:  . amiodarone  100 mg Oral Daily  . aspirin  81 mg Oral Daily  . atorvastatin  40 mg Oral QHS  . budesonide-formoterol  1 puff Inhalation BID  . enoxaparin (LOVENOX) injection  70 mg Subcutaneous Daily  . fluticasone  1 spray Each Nare BID  . furosemide  40 mg Oral BID  . insulin aspart  0-9 Units Subcutaneous TID WC  . insulin detemir  15 Units Subcutaneous q morning - 10a  . isosorbide mononitrate  60 mg Oral Daily  . metoprolol succinate  12.5 mg Oral Daily  . multivitamin-lutein  1 capsule Oral Daily  . NIFEdipine  30 mg Oral Daily  . pantoprazole  40 mg Oral Daily  . [START ON 12/30/2013] potassium chloride SA  20 mEq Oral Daily  . senna-docusate  1 tablet Oral BID  . sodium chloride  3 mL Intravenous Q12H  . sodium chloride  3 mL Intravenous Q12H  . vitamin C  500 mg Oral QHS  . Warfarin - Pharmacist Dosing Inpatient   Does not apply q1800    Assessment/Plan:  80yo female c/o recurrent LE swelling and CP as well as SOB w/ 4# wt gain since yesterday, found w/ pitting edema, admitted for CHF exacerbation. On Coumadin PTA for PAF.  Pt was subtherapeutic at most recent anticoag visit (6/15), last dose of Coumadin prior to admission was on 7/3,  4mg  daily. Gave 6 mg coumadin yesterday. Today 7/5 the INR is 1.52, up from 1.41, remains subtherapeutic. Lovenox bridge started today. Patient with CKD, stage III, SCr mild elevation 1.4, estimated Crcl ~ 27 ml/min. Will dose lovenox at 1mg /kg q24h due to crcl <30 ml/min.   On 7/4, Hgb 10.6, pltc 213K, No  bleeding reported.  On amiodarone PTA  /continues.   Goal of Therapy:  INR 2-3 Monitor platelets by anticoagulation protocol: Yes   Plan:  Lovenox 70 mg SQ q24h Give Coumadin 6 mg po today. INR daily F/u SCr at least weekly.    Nicole Cella, RPh Clinical Pharmacist Pager: [phone] 12/29/2013,12:49 PM

## 2013-12-29 NOTE — Progress Notes (Signed)
[facility] PROGRESS NOTE Interim History: 80 y.o. female with history of CHF last EF measured was 55-60% on January 2014, COPD on home oxygen, diabetes mellitus, atrial fibrillation, peripheral vascular disease presents to the ER because of increasing shortness of breath over the last 2 days. Patient states that in addition patient also has been having some chest pressure off and on which gets better with nitroglycerin. In the ER patient had chest x-ray which shows COPD with compensated CHF. EKG shows sinus bradycardia with RBBB. Patient was given Lasix 40 mg IV. Patient also had taken an extra dose of Lasix at home. Patient has been admitted for CHF exacerbation. Patient denies any productive cough fever chills nausea vomiting abdominal pain diarrhea. Patient also noticed some swelling in her lower extremities.   Filed Weights   12/28/13 0121 12/28/13 0659 12/29/13 0325  Weight: 69.4 kg (153 lb) 70.126 kg (154 lb 9.6 oz) 68.5 kg (151 lb 0.2 oz)        Intake/Output Summary (Last 24 hours) at 12/29/13 1115 Last data filed at 12/29/13 0948  Gross per 24 hour  Intake   1020 ml  Output   1875 ml  Net   -855 ml     Assessment/Plan: 1-Acute on chronic resp failure due to exacerbation of diastolic heart failure. -continue daily weight, strict intake and output and low sodium diet -patient with good diuresis and currently not complaining of significant SOB -will transition lasix to PO -continue oxygen supplementation  2-CHF exacerbation: acute on chronic diastolic -will transition back to PO lasix -repeat BNP in am -continue low sodium, fluid restriction  3-DM type 2 causing CKD stage 3: stable continue insulin  4-CAD, remote RCA PCI, subsequently noted to be occluded, last cath 8/11: no CP, troponin negative and EKG w/o acute ischemic changes. -2-D echo with inferobasal akinesis, otherwise normal. -continue current medication regimen  5-Chronic anticoagulation, on  coumadin: contiue coumadin  6-HTN (hypertension): stable. Will continue current meds.  7-PAF: rate stable. Continue amiodarone and coumadin  8-COPD with emphysema: compensated. No wheezing. -continue oxygen supplementation -continue home inhalers therapy  9-CKD stage III: essentially at baseline. -patient with mild elevation most likely from IV diuresis. -will continue monitoring -lasix transition to PO   Code Status: Full Family Communication: no family at bedside Disposition Plan: home when medically stable   Consultants:  None   Procedures: ECHO: inferobasal akinesis, normal EF, grade 2 diastolic dysfunction; mild MR.  Antibiotics:  None   HPI/Subjective: Patient denies CP, no orthopnea; no fever. Feeling and breathing a lot better.  Objective: Filed Vitals:   12/28/13 1929 12/28/13 2035 12/28/13 2300 12/29/13 0325  BP:  178/64 125/78 146/95  Pulse: 62 120 90 73  Temp:  97.8 F (36.6 C) 96 F (35.6 C) 97.7 F (36.5 C)  TempSrc:  Oral Oral Oral  Resp: 18 19 19 18   Height:      Weight:    68.5 kg (151 lb 0.2 oz)  SpO2: 98% 93% 96% 91%     Exam:  General: Alert, awake, oriented x3, in no acute distress. Feeling and breathing better HEENT: No bruits, no goiter.  Heart: Regular rate; no rubs or gallops; positive SEM. Trace to 1+ edema bilaterally. Lungs: improved air movement; no crackles, no wheezing Abdomen: Soft, nontender, nondistended, positive bowel sounds.  Neuro: Grossly intact, nonfocal.   Data Reviewed: Basic Metabolic Panel:  Recent Labs Lab 12/27/13 2046 12/28/13 0410 12/29/13 0310  NA 144 144 141  K 4.0 3.3*  3.5*  CL 103 100 97  CO2 30 31 30   GLUCOSE 105* 108* 167*  BUN 26* 25* 32*  CREATININE 1.27* 1.25* 1.40*  CALCIUM 9.4 9.1 9.5   Liver Function Tests:  Recent Labs Lab 12/28/13 0410  AST 20  ALT 20  ALKPHOS 76  BILITOT 0.4  PROT 6.4  ALBUMIN 3.2*   CBC:  Recent Labs Lab 12/27/13 2046 12/28/13 0410  WBC 7.4  6.1  NEUTROABS  --  4.2  HGB 11.4* 10.6*  HCT 36.0 34.4*  MCV 97.0 96.6  PLT 245 213   Cardiac Enzymes:  Recent Labs Lab 12/28/13 0410 12/28/13 0750 12/28/13 1324  TROPONINI <0.30 <0.30 <0.30   BNP (last 3 results)  Recent Labs  02/10/13 1321 12/27/13 2046  PROBNP 1464.0* 913.7*   CBG:  Recent Labs Lab 12/28/13 0717 12/28/13 1123 12/28/13 1608 12/29/13 0140 12/29/13 0744  GLUCAP 98 137* 185* 147* 115*   Studies: Dg Chest Port 1 View  12/27/2013   CLINICAL DATA:  History of COPD and CHF and diabetes now with chest pain and shortness of breath  EXAM: PORTABLE CHEST - 1 VIEW  COMPARISON:  PA and lateral chest x-ray of December 17, 2013.  FINDINGS: The lungs are mildly hyperinflated. There is no focal pneumonia. The cardiac silhouette is enlarged. The central pulmonary vascularity is prominent. There is no pleural effusion. There are degenerative changes of the right shoulder.  IMPRESSION: COPD and low-grade compensated CHF.  There is no pneumonia.   Electronically Signed   By: David  Martinique   On: 12/27/2013 21:05    Scheduled Meds: . amiodarone  100 mg Oral Daily  . aspirin  81 mg Oral Daily  . atorvastatin  40 mg Oral QHS  . budesonide-formoterol  1 puff Inhalation BID  . enoxaparin (LOVENOX) injection  70 mg Subcutaneous Daily  . fluticasone  1 spray Each Nare BID  . furosemide  40 mg Oral BID  . insulin aspart  0-9 Units Subcutaneous TID WC  . insulin detemir  15 Units Subcutaneous q morning - 10a  . isosorbide mononitrate  60 mg Oral Daily  . metoprolol succinate  12.5 mg Oral Daily  . multivitamin-lutein  1 capsule Oral Daily  . NIFEdipine  30 mg Oral Daily  . pantoprazole  40 mg Oral Daily  . [START ON 12/30/2013] potassium chloride SA  20 mEq Oral Daily  . senna-docusate  1 tablet Oral BID  . sodium chloride  3 mL Intravenous Q12H  . sodium chloride  3 mL Intravenous Q12H  . vitamin C  500 mg Oral QHS  . Warfarin - Pharmacist Dosing Inpatient   Does not apply  q1800   Continuous Infusions:   Time: < 30 minutes  Barton Dubois  [facility] Pager [phone]. If 8PM-8AM, please contact night-coverage at [email or web address], password [facility] 12/29/2013, 11:15 AM  LOS: 2 days     **Disclaimer: This note may have been dictated with voice recognition software. Similar sounding words can inadvertently be transcribed and this note may contain transcription errors which may not have been corrected upon publication of note.**

## 2013-12-30 ENCOUNTER — Telehealth: Payer: Self-pay | Admitting: Family Medicine

## 2013-12-30 ENCOUNTER — Other Ambulatory Visit: Payer: Self-pay | Admitting: Nurse Practitioner

## 2013-12-30 LAB — PRO B NATRIURETIC PEPTIDE: Pro B Natriuretic peptide (BNP): 508.2 pg/mL — ABNORMAL HIGH (ref 0–450)

## 2013-12-30 LAB — GLUCOSE, CAPILLARY
GLUCOSE-CAPILLARY: 124 mg/dL — AB (ref 70–99)
Glucose-Capillary: 154 mg/dL — ABNORMAL HIGH (ref 70–99)
Glucose-Capillary: 132 mg/dL — ABNORMAL HIGH (ref 70–99)

## 2013-12-30 LAB — BASIC METABOLIC PANEL
ANION GAP: 14 (ref 5–15)
BUN: 38 mg/dL — ABNORMAL HIGH (ref 6–23)
CALCIUM: 9.6 mg/dL (ref 8.4–10.5)
CO2: 29 mEq/L (ref 19–32)
CREATININE: 1.48 mg/dL — AB (ref 0.50–1.10)
Chloride: 99 mEq/L (ref 96–112)
GFR calc Af Amer: 37 mL/min — ABNORMAL LOW (ref >90)
GFR calc non Af Amer: 32 mL/min — ABNORMAL LOW (ref >90)
Glucose, Bld: 174 mg/dL — ABNORMAL HIGH (ref 70–99)
Potassium: 3.3 mEq/L — ABNORMAL LOW (ref 3.7–5.3)
Sodium: 142 mEq/L (ref 137–147)

## 2013-12-30 LAB — PROTIME-INR
INR: 1.43 (ref 0.00–1.49)
Prothrombin Time: 17.5 seconds — ABNORMAL HIGH (ref 11.6–15.2)

## 2013-12-30 MED ORDER — WARFARIN SODIUM 5 MG PO TABS: 5.0000 mg | ORAL_TABLET | Freq: Every day | ORAL | Status: DC

## 2013-12-30 MED ORDER — POTASSIUM CHLORIDE CRYS ER 20 MEQ PO TBCR: 20.0000 meq | EXTENDED_RELEASE_TABLET | Freq: Every day | ORAL | Status: DC

## 2013-12-30 MED ORDER — WARFARIN SODIUM 6 MG PO TABS
6.0000 mg | ORAL_TABLET | Freq: Once | ORAL | Status: DC
  Filled 2013-12-30: qty 1

## 2013-12-30 MED ORDER — PANTOPRAZOLE SODIUM 40 MG PO TBEC: 40.0000 mg | DELAYED_RELEASE_TABLET | Freq: Two times a day (BID) | ORAL | Status: DC

## 2013-12-30 NOTE — Progress Notes (Signed)
ANTICOAGULATION CONSULT NOTE - Follow Up Consult    Pharmacy Consult for Coumadin;  Add Lovenox bridge (7/5>>) Indication: atrial fibrillation  Allergies  Allergen Reactions  . Atorvastatin Other (See Comments)     muscle aches  . Codeine Nausea And Vomiting  . Morphine Nausea And Vomiting  . Other Other (See Comments)    OPIATES cause nausea and vomiting  . Rosuvastatin Other (See Comments)    muscle aches at high doses, held as of 12/2010 due to aches  . Iohexol Other (See Comments)     Desc: unknown reaction; allergic to iodine and contrast     Patient Measurements: Height: 5\' 1"  (154.9 cm) Weight: 149 lb 10.4 oz (67.88 kg) (Scale A) IBW/kg (Calculated) : 47.8  Vital Signs: Temp: 97.7 F (36.5 C) (07/06 0544) Temp src: Oral (07/06 0544) BP: 124/71 mmHg (07/06 0544) Pulse Rate: 57 (07/06 0544)  Labs:  Recent Labs  12/27/13 2046 12/28/13 0410 12/28/13 0750 12/28/13 1324 12/29/13 0310 12/30/13 0239  HGB 11.4* 10.6*  --   --   --   --   HCT 36.0 34.4*  --   --   --   --   PLT 245 213  --   --   --   --   LABPROT  --  17.3*  --   --  18.3* 17.5*  INR  --  1.41  --   --  1.52* 1.43  CREATININE 1.27* 1.25*  --   --  1.40* 1.48*  TROPONINI  --  <0.30 <0.30 <0.30  --   --     Estimated Creatinine Clearance: 25.4 ml/min (by C-G formula based on Cr of 1.48).   Assessment/Plan:  81 yo female admitted 7/3 for CHF exacerbation. She is on chronic Coumadin for PAF.  Patient was subtherapeutic at most recent anticoag visit (6/15), last dose of Coumadin PTA was on 7/3 on 4 mg daily. She has CKD, stage III. Lovenox added until INR>2 and is dosed at 1mg /kg q24h due to CrCl <30 ml/min. INR is subtherapeutic this morning at 1.43. No bleeding noted, no new CBC today.  Goal of Therapy:  INR 2-3 Monitor platelets by anticoagulation protocol: Yes   Plan:  Lovenox 70 mg SQ q24h Coumadin 6 mg PO today INR daily, CBC q72h while on Lovenox F/u SCr at least weekly  [facility], [place].D., BCPS Clinical Pharmacist Pager: [phone] 12/30/2013 11:01 AM

## 2013-12-30 NOTE — Evaluation (Signed)
Clinical/Bedside Swallow Evaluation Patient Details  Name: [name] MRN: [id number] Date of Birth: 08/12/1927  Today's Date: 12/30/2013 Time: 0815-0850 SLP Time Calculation (min): 35 min  Past Medical History:  Past Medical History  Diagnosis Date  . Hyperlipidemia   . Hypertension   . Anxiety   . GERD (gastroesophageal reflux disease)   . CAD (coronary artery disease)     Cath in 2006 showed an occluded RCA with left-to-right collaterals & otherwise noncritical CAD with EF=25-30% at that time. EF subsequently improved to normal by 2D ECHO. Cath Aug. 2011 showed unchanged anatomy. > medical therapy recommended  . Diastolic dysfunction     Grade II  . RBBB (right bundle branch block)     Chronic  . Paroxysmal atrial fibrillation   . Hypoxemic respiratory failure, chronic     uses 2.5 liters of oxygen with sleep  . COPD (chronic obstructive pulmonary disease)     GOLD 4 - PFT 06/08/10 FEV1 0.93 (62%), FEV1% 60, TLC 2.84 (69%0, DLCO 54%, +BD) On home O2.  . Secondary pulmonary hypertension   . Edema   . Angina   . Shortness of breath   . CHF (congestive heart failure)   . Diabetes mellitus     Type II - goal A1C is 8, to avoid hypoglycemia  . Bradycardia 08/24/2011  . Carotid artery disease     Moderate, right greater than left which we following by duplex ultrasound. [place] 12/05/11 = RIght Bulb/Prox ICA: Moderate to severe amt of fibrous plaque elevating velocities w/in prox segment of ICA. Consistent w/a 50-69% diameter reduction. Left Bulb/Prox ICA: Moderate amt of fibrous plaque slightly elevating velocities w/in the prox segment of the ICA. Consistent w/a 0-49% diameter reduction.   Marland Kitchen PAF (paroxysmal atrial fibrillation)     In the past, on coumadin  . CHF (congestive heart failure)     Hospitalized 08/22/11-08/26/11 with CHF secondary to diastolic dysfunction & hospitilized in 07/2012 with a similar problem. TTE 08/23/11 = normal EF.  Marland Kitchen History of stress test 07/02/09    Mild  perfusion defect seen in the Basal Inferior region(s). This is consistent with an infarct/scar.  Post-stress EF=56%. Global LV systolic function is normal.  EKG is negative for ischemia.  No significant iscemia detected. Low risk scan.  . Lower extremity edema     ECHO 07/21/12 = EF 55-60%, performed for TIA. Responding to diurectics. Continue diuresis w/ a goal dry weight of <160lb. Venous Duplex 07/27/11 = Right lower extremity: no evidence of thrombus or thrombophlebitis. Essentially normal right lower extremity venous duplex Doppler evaluation.  . Carotid bruit   . Mitral valve regurgitation     Mild to moderate by ECHO 07/21/12  . Sleep-related hypoventilation   . COPD with emphysema   . Coronary atherosclerosis of unspecified type of vessel, native or graft    Past Surgical History:  Past Surgical History  Procedure Laterality Date  . Rotator cuff repair  07/2005    right  . Abdominal hysterectomy    . Cardiac catheterization  2006 & 2011    Cath in 2006 showed an occluded RCA with left-to-right collaterals & otherwise noncritical CAD with EF=25-30% at that time. EF subsequently improved to normal by 2D ECHO. Cath Aug. 2011 showed unchanged anatomy. > medical therapy recommended.  . Coronary angioplasty    . Coronary angioplasty with stent placement      x2  . Carotid duplex  12/05/11    Moderate, right greater than left which we  following by duplex ultrasound. [place] 12/05/11 = RIght Bulb/Prox ICA: Moderate to severe amt of fibrous plaque elevating velocities w/in prox segment of ICA. Consistent w/a 50-69% diameter reduction. Left Bulb/Prox ICA: Moderate amt of fibrous plaque slightly elevating velocities w/in the prox segment of the ICA. Consistent w/a 0-49% diameter reduction.   . Venous duplex  07/27/11    Venous Duplex 07/27/11 = Right lower extremity: no evidence of thrombus or thrombophlebitis. Essentially normal right lower extremity venous duplex Doppler evaluation.   HPI:  [name] is a 80 y.o. female with history of CHF last EF measured was 55-60% on January 2014, COPD on home oxygen, diabetes mellitus, atrial fibrillation, peripheral vascular disease presents to the ER because of increasing shortness of breath over the last 2 days. Patient states that in addition patient also has been having some chest pressure off and on which gets better with nitroglycerin. In the ER patient had chest x-ray which shows COPD with compensated CHF.  Pt has a history of GERD, seen by Dr. Constance Holster and taking Protonix. She reported had an episode of golbus with regurgitation yesterday after admit. She confirms this happens occasionally and also reports other symptoms consistent with GERD and xerostomia.     Assessment / Plan / Recommendation Clinical Impression  Pt demonstrates mildly impaired swallow function with findings consistent with a primary esophageal dysphagia. Pt with late throat clearing, belching and c/o globus with occasional regurgitation. Pt already consuming a dys 3 (mechnical soft) diet. Reviewed esophageal precautions with pt and dtr, recommending moistening foods, following solids with liquids, avoiding trigger foods and liquids already identified by ENT, and maintaining upright posture after meals. Pt may benefit from f/u with MD/ENT is problem does not improve for further testing/medication management as needed. No further SLP f/u needed at this time. Will sign off.     Aspiration Risk  Mild    Diet Recommendation Dysphagia 3 (Mechanical Soft);Thin liquid   Liquid Administration via: Cup;Straw Medication Administration: Whole meds with liquid Supervision: Patient able to self feed Compensations: Small sips/bites;Follow solids with liquid Postural Changes and/or Swallow Maneuvers: Seated upright 90 degrees;Upright 30-60 min after meal;Out of bed for meals    Other  Recommendations Recommended Consults: Consider esophageal assessment Oral Care Recommendations: Oral care  BID   Follow Up Recommendations  None    Frequency and Duration        Pertinent Vitals/Pain NA    SLP Swallow Goals     Swallow Study Prior Functional Status       General HPI: [name] is a 80 y.o. female with history of CHF last EF measured was 55-60% on January 2014, COPD on home oxygen, diabetes mellitus, atrial fibrillation, peripheral vascular disease presents to the ER because of increasing shortness of breath over the last 2 days. Patient states that in addition patient also has been having some chest pressure off and on which gets better with nitroglycerin. In the ER patient had chest x-ray which shows COPD with compensated CHF.  Pt has a history of GERD, seen by Dr. Constance Holster and taking Protonix. She reported had an episode of golbus with regurgitation yesterday after admit. She confirms this happens occasionally and also reports other symptoms consistent with GERD and xerostomia.   Type of Study: Bedside swallow evaluation Diet Prior to this Study: Dysphagia 3 (soft);Thin liquids Temperature Spikes Noted: No Respiratory Status: Nasal cannula History of Recent Intubation: No Behavior/Cognition: Alert;Cooperative;Pleasant mood Oral Cavity - Dentition: Adequate natural dentition Self-Feeding Abilities: Able  to feed self Patient Positioning: Other (comment) (sitting edge of bed) Baseline Vocal Quality: Clear Volitional Cough: Strong Volitional Swallow: Able to elicit    Oral/Motor/Sensory Function Overall Oral Motor/Sensory Function: Appears within functional limits for tasks assessed   Ice Chips     Thin Liquid Thin Liquid: Impaired Presentation: Cup;Self Fed Pharyngeal  Phase Impairments: Throat Clearing - Delayed    Nectar Thick Nectar Thick Liquid: Not tested   Honey Thick Honey Thick Liquid: Not tested   Puree Puree: Not tested   Solid   GO    Solid: Impaired Presentation: Self Fed Pharyngeal Phase Impairments: Throat Clearing - Delayed      Herbie Baltimore, MA CCC-SLP [phone]  Khyan Oats, Katherene Ponto 12/30/2013,8:59 AM

## 2013-12-30 NOTE — Discharge Summary (Signed)
Physician Discharge Summary  [name] JAS:[id number] DOB: 09-13-1927 DOA: 12/27/2013  PCP: Redge Gainer, MD  Admit date: 12/27/2013 Discharge date: 12/30/2013  Time spent: >30 minutes  Recommendations for Outpatient Follow-up:  1. BMET in 1 week to follow electrolytes and renal function 2. Adjust lasix if needed to maintain stable volume level 3. reassess coumadin level and adjust coumadin dose base on INR range  BNP    Component Value Date/Time   PROBNP 508.2* 12/30/2013 0239   Filed Weights   12/28/13 0659 12/29/13 0325 12/30/13 0544  Weight: 70.126 kg (154 lb 9.6 oz) 68.5 kg (151 lb 0.2 oz) 67.88 kg (149 lb 10.4 oz)     Discharge Diagnoses:  Principal Problem:   CHF exacerbation Active Problems:   PAF, recurrance this admission, on chronic Amio/ Coumadin   COPD with emphysema   HTN (hypertension)   CAD, remote RCA PCI, subsequently noted to be occluded, last cath 8/11   Chronic anticoagulation, on coumadin   DM type 2 causing CKD stage 3   CHF (congestive heart failure)   Discharge Condition: stable and improved. Discharge home with instruction for low sodium diet, daily weight and fluid restriction. Will see cardiologist in 1 week  Diet recommendation: low sodium diet   History of present illness:  80 y.o. female with history of CHF last EF measured was 55-60% on January 2014, COPD on home oxygen, diabetes mellitus, atrial fibrillation, peripheral vascular disease presents to the ER because of increasing shortness of breath over the last 2 days. Patient states that in addition patient also has been having some chest pressure off and on which gets better with nitroglycerin. In the ER patient had chest x-ray which shows COPD with compensated CHF. EKG shows sinus bradycardia with RBBB. Patient was given Lasix 40 mg IV. Patient also had taken an extra dose of Lasix at home. Patient has been admitted for CHF exacerbation. Patient denies any productive cough fever chills  nausea vomiting abdominal pain diarrhea. Patient also noticed some swelling in her lower extremities.   Hospital Course:  1-Acute on chronic resp failure due to exacerbation of diastolic heart failure.  -resolved -will continue oxygen supplementation -patient to follow low sodium diet, fluid restriction and to check her weight on daily basis  2-CHF exacerbation: acute on chronic diastolic  -will transition back to PO lasix 40mg  BID -ask to follow low sodium diet, check weight on daily basis and to restrict fluid intake to 2L  3-DM type 2 causing CKD stage 3: stable continue current insulin therapy  4-CAD, remote RCA PCI, subsequently noted to be occluded, last cath 8/11: no CP, troponin negative and EKG w/o acute ischemic changes.  -2-D echo with inferobasal akinesis, otherwise normal.  -continue current medication regimen  -follow up with cardiology in 1 week  5-Chronic anticoagulation, on coumadin: contiue coumadin  -patient consecutively subtherapeutic at that dose during admission.  6-HTN (hypertension): stable. Will continue current meds.   7-PAF: rate stable. Continue amiodarone and coumadin  -needs follow up pf INR in 1 week -has been discharge on 5mg  daily instead of 4mg ; close follow up as an outpatient   8-COPD with emphysema: compensated. No wheezing.  -continue oxygen supplementation (chronically 2-3L) -continue home inhalers therapy   9-CKD stage III: essentially at baseline.  -patient with mild elevation most likely from IV diuresis.  -Cr/GFR essentially back to baseline at discharge -BMET in 1 week during follow up visit  10-slight dysphagia: due to esophageal dysmotility -will adjust PPI to  BID -patient to follow with ENT/GI as an outpatient -discharge with instructions for dysphagia 3 diet  Procedures: See below for x-ray reports   2-De cho inferobasal akinesis, normal EF, grade 2 diastolic dysfunction; mild MR.  Consultations:  None   Discharge  Exam: Filed Vitals:   12/30/13 0544  BP: 124/71  Pulse: 57  Temp: 97.7 F (36.5 C)  Resp: 18   General: Alert, awake, oriented x3, in no acute distress. Feeling and breathing great HEENT: No bruits, no goiter; no JVD Heart: Regular rate; no rubs or gallops; positive SEM. Trace edema bilaterally.  Lungs: improved air movement; no crackles, no wheezing  Abdomen: Soft, nontender, nondistended, positive bowel sounds.  Neuro: Grossly intact, nonfocal.   Discharge Instructions  Discharge Instructions   Diet - low sodium heart healthy    Complete by:  As directed      Discharge instructions    Complete by:  As directed   Follow a mechanical soft diet Take medications as prescribed Follow up with Dr. Gwenlyn Found in 1 week Follow a low sodium diet (less than 2 gram daily) Restrict your fluid intake to 2L in 24 hours Please check your weight on daily basis (take extra dose of lasix and contact cardiology office if you gain > 3 pounds overnight or > 5 pounds in a week)            Medication List         ALPRAZolam 0.25 MG tablet  Commonly known as:  XANAX  Take 0.125-0.25 mg by mouth 2 (two) times daily as needed for anxiety.     amiodarone 100 MG tablet  Commonly known as:  PACERONE  Take 100 mg by mouth every morning.     aspirin 81 MG chewable tablet  Chew 81 mg by mouth daily.     atorvastatin 40 MG tablet  Commonly known as:  LIPITOR  Take 40 mg by mouth at bedtime.     Azelastine HCl 0.15 % Soln  Place 1 spray into both nostrils 2 (two) times daily as needed (congestion).     budesonide-formoterol 160-4.5 MCG/ACT inhaler  Commonly known as:  SYMBICORT  Inhale 1 puff into the lungs 2 (two) times daily.     fluticasone 50 MCG/ACT nasal spray  Commonly known as:  FLONASE  Place 1 spray into both nostrils 2 (two) times daily.     furosemide 40 MG tablet  Commonly known as:  LASIX  Take 40 mg by mouth See admin instructions. Take 1 tablet (40 mg) twice daily, may take  an extra dose as needed for fluid buildup (3 1/2 pound weight gain)     glucosamine-chondroitin 500-400 MG tablet  Take 1 tablet by mouth 2 (two) times daily.     guaiFENesin 600 MG 12 hr tablet  Commonly known as:  MUCINEX  Take 2 tablets (1,200 mg total) by mouth 2 (two) times daily as needed for cough or to loosen phlegm.     HUMALOG KWIKPEN 100 UNIT/ML KiwkPen  Generic drug:  insulin lispro  Inject 0-8 Units into the skin 3 (three) times daily before meals. Per sliding scale     insulin detemir 100 UNIT/ML injection  Commonly known as:  LEVEMIR  Inject 0.2 mLs (20 Units total) into the skin every morning.     Ipratropium-Albuterol 20-100 MCG/ACT Aers respimat  Commonly known as:  COMBIVENT  Inhale 1 puff into the lungs every 6 (six) hours as needed for wheezing or shortness of  breath.     isosorbide mononitrate 60 MG 24 hr tablet  Commonly known as:  IMDUR  Take 1 tablet (60 mg total) by mouth daily.     metoprolol succinate 25 MG 24 hr tablet  Commonly known as:  TOPROL-XL  Take 12.5 mg by mouth daily.     NIFEDICAL XL 30 MG 24 hr tablet  Generic drug:  NIFEdipine  Take 30 mg by mouth daily.     nitroGLYCERIN 0.4 MG SL tablet  Commonly known as:  NITROSTAT  Place 1 tablet (0.4 mg total) under the tongue every 5 (five) minutes as needed for chest pain.     pantoprazole 40 MG tablet  Commonly known as:  PROTONIX  Take 1 tablet (40 mg total) by mouth 2 (two) times daily.     potassium chloride SA 20 MEQ tablet  Commonly known as:  K-DUR,KLOR-CON  Take 1 tablet (20 mEq total) by mouth daily.     PRESERVISION/LUTEIN Caps  Take 1 capsule by mouth daily.     senna-docusate 8.6-50 MG per tablet  Commonly known as:  Senokot-S  Take 1 tablet by mouth 2 (two) times daily.     simethicone 80 MG chewable tablet  Commonly known as:  MYLICON  Chew 80 mg by mouth at bedtime as needed for flatulence.     vitamin C 500 MG tablet  Commonly known as:  ASCORBIC ACID  Take  500 mg by mouth at bedtime.     VITAMIN D PO  Take 1 capsule by mouth daily.     warfarin 5 MG tablet  Commonly known as:  COUMADIN  Take 1 tablet (5 mg total) by mouth daily after supper.       Allergies  Allergen Reactions  . Atorvastatin Other (See Comments)     muscle aches  . Codeine Nausea And Vomiting  . Morphine Nausea And Vomiting  . Other Other (See Comments)    OPIATES cause nausea and vomiting  . Rosuvastatin Other (See Comments)    muscle aches at high doses, held as of 12/2010 due to aches  . Iohexol Other (See Comments)     Desc: unknown reaction; allergic to iodine and contrast        Follow-up Information   Follow up with Redge Gainer, MD. Schedule an appointment as soon as possible for a visit in 2 weeks.   Specialty:  Family Medicine   Contact information:   [place] [address] [phone]       Follow up with Lorretta Harp, MD In 1 week.   Specialty:  Cardiology   Contact information:   [address] [place] [address] [phone]        The results of significant diagnostics from this hospitalization (including imaging, microbiology, ancillary and laboratory) are listed below for reference.    Significant Diagnostic Studies: Dg Chest 2 View  12/17/2013   CLINICAL DATA:  COPD, increased cough, back pain, sneezing, former smoker, past history hypertension, hyperlipidemia, coronary artery disease with diastolic dysfunction, secondary pulmonary hypertension, CHF, diabetes  EXAM: CHEST  2 VIEW  COMPARISON:  05/20/2013  FINDINGS: Enlargement of cardiac silhouette with pulmonary vascular congestion.  Atherosclerotic calcification of an elongated thoracic aorta.  Enlarged central pulmonary arteries.  Chronic bronchitic changes.  No acute infiltrate, pleural effusion or pneumothorax.  Minimal chronic atelectasis versus scarring at bases.  Bones demineralized with mild degenerative disc disease changes thoracic  spine.  IMPRESSION: Enlargement of cardiac  silhouette with pulmonary vascular congestion.  Chronic bronchitic changes.  Question pulmonary arterial hypertension.  No acute abnormalities.   Electronically Signed   By: Lavonia Dana M.D.   On: 12/17/2013 14:03   Dg Chest Port 1 View  12/27/2013   CLINICAL DATA:  History of COPD and CHF and diabetes now with chest pain and shortness of breath  EXAM: PORTABLE CHEST - 1 VIEW  COMPARISON:  PA and lateral chest x-ray of December 17, 2013.  FINDINGS: The lungs are mildly hyperinflated. There is no focal pneumonia. The cardiac silhouette is enlarged. The central pulmonary vascularity is prominent. There is no pleural effusion. There are degenerative changes of the right shoulder.  IMPRESSION: COPD and low-grade compensated CHF.  There is no pneumonia.   Electronically Signed   By: David  Martinique   On: 12/27/2013 21:05   Labs: Basic Metabolic Panel:  Recent Labs Lab 12/27/13 2046 12/28/13 0410 12/29/13 0310 12/30/13 0239  NA 144 144 141 142  K 4.0 3.3* 3.5* 3.3*  CL 103 100 97 99  CO2 30 31 30 29   GLUCOSE 105* 108* 167* 174*  BUN 26* 25* 32* 38*  CREATININE 1.27* 1.25* 1.40* 1.48*  CALCIUM 9.4 9.1 9.5 9.6   Liver Function Tests:  Recent Labs Lab 12/28/13 0410  AST 20  ALT 20  ALKPHOS 76  BILITOT 0.4  PROT 6.4  ALBUMIN 3.2*   CBC:  Recent Labs Lab 12/27/13 2046 12/28/13 0410  WBC 7.4 6.1  NEUTROABS  --  4.2  HGB 11.4* 10.6*  HCT 36.0 34.4*  MCV 97.0 96.6  PLT 245 213   Cardiac Enzymes:  Recent Labs Lab 12/28/13 0410 12/28/13 0750 12/28/13 1324  TROPONINI <0.30 <0.30 <0.30   BNP: BNP (last 3 results)  Recent Labs  02/10/13 1321 12/27/13 2046 12/30/13 0239  PROBNP 1464.0* 913.7* 508.2*   CBG:  Recent Labs Lab 12/29/13 1617 12/29/13 2144 12/30/13 0239 12/30/13 0644 12/30/13 1115  GLUCAP 173* 152* 154* 124* 132*    Signed:  Barton Dubois  [facility] 12/30/2013, 12:28 PM   **Disclaimer: This note  may have been dictated with voice recognition software. Similar sounding words can inadvertently be transcribed and this note may contain transcription errors which may not have been corrected upon publication of note.**

## 2014-01-03 ENCOUNTER — Encounter: Payer: Self-pay | Admitting: Pharmacist

## 2014-01-03 ENCOUNTER — Ambulatory Visit (INDEPENDENT_AMBULATORY_CARE_PROVIDER_SITE_OTHER): Payer: Medicare Other | Admitting: Pharmacist

## 2014-01-03 VITALS — BP 120/60 | HR 60 | Ht 61.0 in | Wt 153.0 lb

## 2014-01-03 DIAGNOSIS — Z13820 Encounter for screening for osteoporosis: Secondary | ICD-10-CM

## 2014-01-03 DIAGNOSIS — Z23 Encounter for immunization: Secondary | ICD-10-CM

## 2014-01-03 DIAGNOSIS — I2510 Atherosclerotic heart disease of native coronary artery without angina pectoris: Secondary | ICD-10-CM

## 2014-01-03 DIAGNOSIS — E875 Hyperkalemia: Secondary | ICD-10-CM

## 2014-01-03 DIAGNOSIS — Z7901 Long term (current) use of anticoagulants: Secondary | ICD-10-CM

## 2014-01-03 DIAGNOSIS — Z0000 Encounter for general adult medical examination without abnormal findings: Secondary | ICD-10-CM

## 2014-01-03 LAB — POCT INR: INR: 1.9

## 2014-01-03 NOTE — Patient Instructions (Addendum)
Health Maintenance Summary    TETANUS/TDAP Overdue 02/27/1947      COLONOSCOPY Overdue 02/28/1980  Patient checking on who would like referred to   DEXA / bone density   Checking on when last done    ZOSTAVAX Overdue 02/27/1990  Information given - about vaccine    PNEUMOCOCCAL POLYSACCHARIDE VACCINE AGE 78 AND OVER Overdue 02/28/1995       INFLUENZA VACCINE Next Due 01/25/2014  Last was 04/01/2013        Anticoagulation Dose Instructions as of 01/03/2014     Dorene Grebe Tue Wed Thu Fri Sat   New Dose 5 mg 5 mg 5 mg 5 mg 5 mg 5 mg 5 mg    Description       Take 1 tablet daily of warfarin 51m.      INR was 1.9 today (goal is 2.0 to 3.0)    Preventive Care for Adults A healthy lifestyle and preventive care can promote health and wellness. Preventive health guidelines for women include the following key practices.  A routine yearly physical is a good way to check with your health care provider about your health and preventive screening. It is a chance to share any concerns and updates on your health and to receive a thorough exam.  Visit your dentist for a routine exam and preventive care every 6 months. Brush your teeth twice a day and floss once a day. Good oral hygiene prevents tooth decay and gum disease.  The frequency of eye exams is based on your age, health, family medical history, use of contact lenses, and other factors. Follow your health care provider's recommendations for frequency of eye exams.  Eat a healthy diet. Foods like vegetables, fruits, whole grains, low-fat dairy products, and lean protein foods contain the nutrients you need without too many calories. Decrease your intake of foods high in solid fats, added sugars, and salt. Eat the right amount of calories for you.Get information about a proper diet from your health care provider, if necessary.  Regular physical exercise is one of the most important things you can do for your health. Most adults should get at least 150  minutes of moderate-intensity exercise (any activity that increases your heart rate and causes you to sweat) each week. In addition, most adults need muscle-strengthening exercises on 2 or more days a week.  Maintain a healthy weight. The body mass index (BMI) is a screening tool to identify possible weight problems. It provides an estimate of body fat based on height and weight. Your health care provider can find your BMI, and can help you achieve or maintain a healthy weight.For adults 20 years and older:  A BMI below 18.5 is considered underweight.  A BMI of 18.5 to 24.9 is normal.  A BMI of 25 to 29.9 is considered overweight.  A BMI of 30 and above is considered obese.  Maintain normal blood lipids and cholesterol levels by exercising and minimizing your intake of saturated fat. Eat a balanced diet with plenty of fruit and vegetables. Blood tests for lipids and cholesterol should begin at age 4236and be repeated every 5 years. If your lipid or cholesterol levels are high, you are over 50, or you are at high risk for heart disease, you may need your cholesterol levels checked more frequently.Ongoing high lipid and cholesterol levels should be treated with medicines if diet and exercise are not working.  If you smoke, find out from your health care provider how to quit.  If you do not use tobacco, do not start.  Lung cancer screening is recommended for adults aged 65-80 years who are at high risk for developing lung cancer because of a history of smoking. A yearly low-dose CT scan of the lungs is recommended for people who have at least a 30-pack-year history of smoking and are a current smoker or have quit within the past 15 years. A pack year of smoking is smoking an average of 1 pack of cigarettes a day for 1 year (for example: 1 pack a day for 30 years or 2 packs a day for 15 years). Yearly screening should continue until the smoker has stopped smoking for at least 15 years. Yearly screening  should be stopped for people who develop a health problem that would prevent them from having lung cancer treatment.  If you are pregnant, do not drink alcohol. If you are breastfeeding, be very cautious about drinking alcohol. If you are not pregnant and choose to drink alcohol, do not have more than 1 drink per day. One drink is considered to be 12 ounces (355 mL) of beer, 5 ounces (148 mL) of wine, or 1.5 ounces (44 mL) of liquor.  Avoid use of street drugs. Do not share needles with anyone. Ask for help if you need support or instructions about stopping the use of drugs.  High blood pressure causes heart disease and increases the risk of stroke. Your blood pressure should be checked at least every 1 to 2 years. Ongoing high blood pressure should be treated with medicines if weight loss and exercise do not work.  If you are 9-53 years old, ask your health care provider if you should take aspirin to prevent strokes.  Diabetes screening involves taking a blood sample to check your fasting blood sugar level. This should be done once every 3 years, after age 35, if you are within normal weight and without risk factors for diabetes. Testing should be considered at a younger age or be carried out more frequently if you are overweight and have at least 1 risk factor for diabetes.  Breast cancer screening is essential preventive care for women. You should practice "breast self-awareness." This means understanding the normal appearance and feel of your breasts and may include breast self-examination. Any changes detected, no matter how small, should be reported to a health care provider. Women in their 23s and 30s should have a clinical breast exam (CBE) by a health care provider as part of a regular health exam every 1 to 3 years. After age 83, women should have a CBE every year. Starting at age 3, women should consider having a mammogram (breast X-ray test) every year. Women who have a family history of  breast cancer should talk to their health care provider about genetic screening. Women at a high risk of breast cancer should talk to their health care providers about having an MRI and a mammogram every year.  Breast cancer gene (BRCA)-related cancer risk assessment is recommended for women who have family members with BRCA-related cancers. BRCA-related cancers include breast, ovarian, tubal, and peritoneal cancers. Having family members with these cancers may be associated with an increased risk for harmful changes (mutations) in the breast cancer genes BRCA1 and BRCA2. Results of the assessment will determine the need for genetic counseling and BRCA1 and BRCA2 testing.  Routine pelvic exams to screen for cancer are no longer recommended for nonpregnant women who are considered low risk for cancer of the pelvic organs (  ovaries, uterus, and vagina) and who do not have symptoms. Ask your health care provider if a screening pelvic exam is right for you.  If you have had past treatment for cervical cancer or a condition that could lead to cancer, you need Pap tests and screening for cancer for at least 20 years after your treatment. If Pap tests have been discontinued, your risk factors (such as having a new sexual partner) need to be reassessed to determine if screening should be resumed. Some women have medical problems that increase the chance of getting cervical cancer. In these cases, your health care provider may recommend more frequent screening and Pap tests.  The HPV test is an additional test that may be used for cervical cancer screening. The HPV test looks for the virus that can cause the cell changes on the cervix. The cells collected during the Pap test can be tested for HPV. The HPV test could be used to screen women aged 29 years and older, and should be used in women of any age who have unclear Pap test results. After the age of 80, women should have HPV testing at the same frequency as a Pap  test.  Colorectal cancer can be detected and often prevented. Most routine colorectal cancer screening begins at the age of 88 years and continues through age 63 years. However, your health care provider may recommend screening at an earlier age if you have risk factors for colon cancer. On a yearly basis, your health care provider may provide home test kits to check for hidden blood in the stool. Use of a small camera at the end of a tube, to directly examine the colon (sigmoidoscopy or colonoscopy), can detect the earliest forms of colorectal cancer. Talk to your health care provider about this at age 76, when routine screening begins. Direct exam of the colon should be repeated every 5-10 years through age 32 years, unless early forms of pre-cancerous polyps or small growths are found.  People who are at an increased risk for hepatitis B should be screened for this virus. You are considered at high risk for hepatitis B if:  You were born in a country where hepatitis B occurs often. Talk with your health care provider about which countries are considered high risk.  Your parents were born in a high-risk country and you have not received a shot to protect against hepatitis B (hepatitis B vaccine).  You have HIV or AIDS.  You use needles to inject street drugs.  You live with, or have sex with, someone who has Hepatitis B.  You get hemodialysis treatment.  You take certain medicines for conditions like cancer, organ transplantation, and autoimmune conditions.  Hepatitis C blood testing is recommended for all people born from [birth year] through 1965 and any individual with known risks for hepatitis C.  Practice safe sex. Use condoms and avoid high-risk sexual practices to reduce the spread of sexually transmitted infections (STIs). STIs include gonorrhea, chlamydia, syphilis, trichomonas, herpes, HPV, and human immunodeficiency virus (HIV). Herpes, HIV, and HPV are viral illnesses that have no cure.  They can result in disability, cancer, and death.  You should be screened for sexually transmitted illnesses (STIs) including gonorrhea and chlamydia if:  You are sexually active and are younger than 24 years.  You are older than 24 years and your health care provider tells you that you are at risk for this type of infection.  Your sexual activity has changed since you were last  screened and you are at an increased risk for chlamydia or gonorrhea. Ask your health care provider if you are at risk.  If you are at risk of being infected with HIV, it is recommended that you take a prescription medicine daily to prevent HIV infection. This is called preexposure prophylaxis (PrEP). You are considered at risk if:  You are a heterosexual woman, are sexually active, and are at increased risk for HIV infection.  You take drugs by injection.  You are sexually active with a partner who has HIV.  Talk with your health care provider about whether you are at high risk of being infected with HIV. If you choose to begin PrEP, you should first be tested for HIV. You should then be tested every 3 months for as long as you are taking PrEP.  Osteoporosis is a disease in which the bones lose minerals and strength with aging. This can result in serious bone fractures or breaks. The risk of osteoporosis can be identified using a bone density scan. Women ages 27 years and over and women at risk for fractures or osteoporosis should discuss screening with their health care providers. Ask your health care provider whether you should take a calcium supplement or vitamin D to reduce the rate of osteoporosis.  Menopause can be associated with physical symptoms and risks. Hormone replacement therapy is available to decrease symptoms and risks. You should talk to your health care provider about whether hormone replacement therapy is right for you.  Use sunscreen. Apply sunscreen liberally and repeatedly throughout the day.  You should seek shade when your shadow is shorter than you. Protect yourself by wearing long sleeves, pants, a wide-brimmed hat, and sunglasses year round, whenever you are outdoors.  Once a month, do a whole body skin exam, using a mirror to look at the skin on your back. Tell your health care provider of new moles, moles that have irregular borders, moles that are larger than a pencil eraser, or moles that have changed in shape or color.  Stay current with required vaccines (immunizations).  Influenza vaccine. All adults should be immunized every year.  Tetanus, diphtheria, and acellular pertussis (Td, Tdap) vaccine. Pregnant women should receive 1 dose of Tdap vaccine during each pregnancy. The dose should be obtained regardless of the length of time since the last dose. Immunization is preferred during the 27th-36th week of gestation. An adult who has not previously received Tdap or who does not know her vaccine status should receive 1 dose of Tdap. This initial dose should be followed by tetanus and diphtheria toxoids (Td) booster doses every 10 years. Adults with an unknown or incomplete history of completing a 3-dose immunization series with Td-containing vaccines should begin or complete a primary immunization series including a Tdap dose. Adults should receive a Td booster every 10 years.  Varicella vaccine. An adult without evidence of immunity to varicella should receive 2 doses or a second dose if she has previously received 1 dose. Pregnant females who do not have evidence of immunity should receive the first dose after pregnancy. This first dose should be obtained before leaving the health care facility. The second dose should be obtained 4-8 weeks after the first dose.  Human papillomavirus (HPV) vaccine. Females aged 13-26 years who have not received the vaccine previously should obtain the 3-dose series. The vaccine is not recommended for use in pregnant females. However, pregnancy  testing is not needed before receiving a dose. If a female is found  to be pregnant after receiving a dose, no treatment is needed. In that case, the remaining doses should be delayed until after the pregnancy. Immunization is recommended for any person with an immunocompromised condition through the age of 66 years if she did not get any or all doses earlier. During the 3-dose series, the second dose should be obtained 4-8 weeks after the first dose. The third dose should be obtained 24 weeks after the first dose and 16 weeks after the second dose.  Zoster vaccine. One dose is recommended for adults aged 57 years or older unless certain conditions are present.  Measles, mumps, and rubella (MMR) vaccine. Adults born before [birth year] generally are considered immune to measles and mumps. Adults born in [birth year] or later should have 1 or more doses of MMR vaccine unless there is a contraindication to the vaccine or there is laboratory evidence of immunity to each of the three diseases. A routine second dose of MMR vaccine should be obtained at least 28 days after the first dose for students attending postsecondary schools, health care workers, or international travelers. People who received inactivated measles vaccine or an unknown type of measles vaccine during 1963-1967 should receive 2 doses of MMR vaccine. People who received inactivated mumps vaccine or an unknown type of mumps vaccine before 1979 and are at high risk for mumps infection should consider immunization with 2 doses of MMR vaccine. For females of childbearing age, rubella immunity should be determined. If there is no evidence of immunity, females who are not pregnant should be vaccinated. If there is no evidence of immunity, females who are pregnant should delay immunization until after pregnancy. Unvaccinated health care workers born before [birth year] who lack laboratory evidence of measles, mumps, or rubella immunity or laboratory confirmation of disease should  consider measles and mumps immunization with 2 doses of MMR vaccine or rubella immunization with 1 dose of MMR vaccine.  Pneumococcal 13-valent conjugate (PCV13) vaccine. When indicated, a person who is uncertain of her immunization history and has no record of immunization should receive the PCV13 vaccine. An adult aged 68 years or older who has certain medical conditions and has not been previously immunized should receive 1 dose of PCV13 vaccine. This PCV13 should be followed with a dose of pneumococcal polysaccharide (PPSV23) vaccine. The PPSV23 vaccine dose should be obtained at least 8 weeks after the dose of PCV13 vaccine. An adult aged 45 years or older who has certain medical conditions and previously received 1 or more doses of PPSV23 vaccine should receive 1 dose of PCV13. The PCV13 vaccine dose should be obtained 1 or more years after the last PPSV23 vaccine dose.  Pneumococcal polysaccharide (PPSV23) vaccine. When PCV13 is also indicated, PCV13 should be obtained first. All adults aged 74 years and older should be immunized. An adult younger than age 45 years who has certain medical conditions should be immunized. Any person who resides in a nursing home or long-term care facility should be immunized. An adult smoker should be immunized. People with an immunocompromised condition and certain other conditions should receive both PCV13 and PPSV23 vaccines. People with human immunodeficiency virus (HIV) infection should be immunized as soon as possible after diagnosis. Immunization during chemotherapy or radiation therapy should be avoided. Routine use of PPSV23 vaccine is not recommended for American Indians, [place] Natives, or people younger than 65 years unless there are medical conditions that require PPSV23 vaccine. When indicated, people who have unknown immunization and have no record of immunization should  receive PPSV23 vaccine. One-time revaccination 5 years after the first dose of PPSV23 is  recommended for people aged 19-64 years who have chronic kidney failure, nephrotic syndrome, asplenia, or immunocompromised conditions. People who received 1-2 doses of PPSV23 before age 45 years should receive another dose of PPSV23 vaccine at age 28 years or later if at least 5 years have passed since the previous dose. Doses of PPSV23 are not needed for people immunized with PPSV23 at or after age 12 years.  Meningococcal vaccine. Adults with asplenia or persistent complement component deficiencies should receive 2 doses of quadrivalent meningococcal conjugate (MenACWY-D) vaccine. The doses should be obtained at least 2 months apart. Microbiologists working with certain meningococcal bacteria, [employer] recruits, people at risk during an outbreak, and people who travel to or live in countries with a high rate of meningitis should be immunized. A first-year college student up through age 16 years who is living in a residence hall should receive a dose if she did not receive a dose on or after her 16th birthday. Adults who have certain high-risk conditions should receive one or more doses of vaccine.  Hepatitis A vaccine. Adults who wish to be protected from this disease, have certain high-risk conditions, work with hepatitis A-infected animals, work in hepatitis A research labs, or travel to or work in countries with a high rate of hepatitis A should be immunized. Adults who were previously unvaccinated and who anticipate close contact with an international adoptee during the first 60 days after arrival in the [place] from a country with a high rate of hepatitis A should be immunized.  Hepatitis B vaccine. Adults who wish to be protected from this disease, have certain high-risk conditions, may be exposed to blood or other infectious body fluids, are household contacts or sex partners of hepatitis B positive people, are clients or workers in certain care facilities, or travel to or work in countries  with a high rate of hepatitis B should be immunized.  Haemophilus influenzae type b (Hib) vaccine. A previously unvaccinated person with asplenia or sickle cell disease or having a scheduled splenectomy should receive 1 dose of Hib vaccine. Regardless of previous immunization, a recipient of a hematopoietic stem cell transplant should receive a 3-dose series 6-12 months after her successful transplant. Hib vaccine is not recommended for adults with HIV infection. Preventive Services / Frequency Ages 26 to 39years  Blood pressure check.** / Every 1 to 2 years.  Lipid and cholesterol check.** / Every 5 years beginning at age 36.  Clinical breast exam.** / Every 3 years for women in their 60s and 35s.  BRCA-related cancer risk assessment.** / For women who have family members with a BRCA-related cancer (breast, ovarian, tubal, or peritoneal cancers).  Pap test.** / Every 2 years from ages 19 through 25. Every 3 years starting at age 59 through age 85 or 68 with a history of 3 consecutive normal Pap tests.  HPV screening.** / Every 3 years from ages 28 through ages 89 to 40 with a history of 3 consecutive normal Pap tests.  Hepatitis C blood test.** / For any individual with known risks for hepatitis C.  Skin self-exam. / Monthly.  Influenza vaccine. / Every year.  Tetanus, diphtheria, and acellular pertussis (Tdap, Td) vaccine.** / Consult your health care provider. Pregnant women should receive 1 dose of Tdap vaccine during each pregnancy. 1 dose of Td every 10 years.  Varicella vaccine.** / Consult your health care provider. Pregnant females who  do not have evidence of immunity should receive the first dose after pregnancy.  HPV vaccine. / 3 doses over 6 months, if 88 and younger. The vaccine is not recommended for use in pregnant females. However, pregnancy testing is not needed before receiving a dose.  Measles, mumps, rubella (MMR) vaccine.** / You need at least 1 dose of MMR if you  were born in 1957 or later. You may also need a 2nd dose. For females of childbearing age, rubella immunity should be determined. If there is no evidence of immunity, females who are not pregnant should be vaccinated. If there is no evidence of immunity, females who are pregnant should delay immunization until after pregnancy.  Pneumococcal 13-valent conjugate (PCV13) vaccine.** / Consult your health care provider.  Pneumococcal polysaccharide (PPSV23) vaccine.** / 1 to 2 doses if you smoke cigarettes or if you have certain conditions.  Meningococcal vaccine.** / 1 dose if you are age 16 to 5 years and a Market researcher living in a residence hall, or have one of several medical conditions, you need to get vaccinated against meningococcal disease. You may also need additional booster doses.  Hepatitis A vaccine.** / Consult your health care provider.  Hepatitis B vaccine.** / Consult your health care provider.  Haemophilus influenzae type b (Hib) vaccine.** / Consult your health care provider. Ages 17 to 64years  Blood pressure check.** / Every 1 to 2 years.  Lipid and cholesterol check.** / Every 5 years beginning at age 40 years.  Lung cancer screening. / Every year if you are aged 67-80 years and have a 30-pack-year history of smoking and currently smoke or have quit within the past 15 years. Yearly screening is stopped once you have quit smoking for at least 15 years or develop a health problem that would prevent you from having lung cancer treatment.  Clinical breast exam.** / Every year after age 43 years.  BRCA-related cancer risk assessment.** / For women who have family members with a BRCA-related cancer (breast, ovarian, tubal, or peritoneal cancers).  Mammogram.** / Every year beginning at age 3 years and continuing for as long as you are in good health. Consult with your health care provider.  Pap test.** / Every 3 years starting at age 83 years through age 59 or  62 years with a history of 3 consecutive normal Pap tests.  HPV screening.** / Every 3 years from ages 66 years through ages 41 to 61 years with a history of 3 consecutive normal Pap tests.  Fecal occult blood test (FOBT) of stool. / Every year beginning at age 53 years and continuing until age 51 years. You may not need to do this test if you get a colonoscopy every 10 years.  Flexible sigmoidoscopy or colonoscopy.** / Every 5 years for a flexible sigmoidoscopy or every 10 years for a colonoscopy beginning at age 24 years and continuing until age 66 years.  Hepatitis C blood test.** / For all people born from [birth year] through 1963 and any individual with known risks for hepatitis C.  Skin self-exam. / Monthly.  Influenza vaccine. / Every year.  Tetanus, diphtheria, and acellular pertussis (Tdap/Td) vaccine.** / Consult your health care provider. Pregnant women should receive 1 dose of Tdap vaccine during each pregnancy. 1 dose of Td every 10 years.  Varicella vaccine.** / Consult your health care provider. Pregnant females who do not have evidence of immunity should receive the first dose after pregnancy.  Zoster vaccine.** / 1 dose for adults  aged 4 years or older.  Measles, mumps, rubella (MMR) vaccine.** / You need at least 1 dose of MMR if you were born in 1957 or later. You may also need a 2nd dose. For females of childbearing age, rubella immunity should be determined. If there is no evidence of immunity, females who are not pregnant should be vaccinated. If there is no evidence of immunity, females who are pregnant should delay immunization until after pregnancy.  Pneumococcal 13-valent conjugate (PCV13) vaccine.** / Consult your health care provider.  Pneumococcal polysaccharide (PPSV23) vaccine.** / 1 to 2 doses if you smoke cigarettes or if you have certain conditions.  Meningococcal vaccine.** / Consult your health care provider.  Hepatitis A vaccine.** / Consult your health care  provider.  Hepatitis B vaccine.** / Consult your health care provider.  Haemophilus influenzae type b (Hib) vaccine.** / Consult your health care provider. Ages 69 years and over  Blood pressure check.** / Every 1 to 2 years.  Lipid and cholesterol check.** / Every 5 years beginning at age 59 years.  Lung cancer screening. / Every year if you are aged 73-80 years and have a 30-pack-year history of smoking and currently smoke or have quit within the past 15 years. Yearly screening is stopped once you have quit smoking for at least 15 years or develop a health problem that would prevent you from having lung cancer treatment.  Clinical breast exam.** / Every year after age 42 years.  BRCA-related cancer risk assessment.** / For women who have family members with a BRCA-related cancer (breast, ovarian, tubal, or peritoneal cancers).  Mammogram.** / Every year beginning at age 20 years and continuing for as long as you are in good health. Consult with your health care provider.  Pap test.** / Every 3 years starting at age 85 years through age 72 or 63 years with 3 consecutive normal Pap tests. Testing can be stopped between 65 and 70 years with 3 consecutive normal Pap tests and no abnormal Pap or HPV tests in the past 10 years.  HPV screening.** / Every 3 years from ages 50 years through ages 9 or 37 years with a history of 3 consecutive normal Pap tests. Testing can be stopped between 65 and 70 years with 3 consecutive normal Pap tests and no abnormal Pap or HPV tests in the past 10 years.  Fecal occult blood test (FOBT) of stool. / Every year beginning at age 36 years and continuing until age 81 years. You may not need to do this test if you get a colonoscopy every 10 years.  Flexible sigmoidoscopy or colonoscopy.** / Every 5 years for a flexible sigmoidoscopy or every 10 years for a colonoscopy beginning at age 49 years and continuing until age 58 years.  Hepatitis C blood test.** / For  all people born from [birth year] through 1963 and any individual with known risks for hepatitis C.  Osteoporosis screening.** / A one-time screening for women ages 26 years and over and women at risk for fractures or osteoporosis.  Skin self-exam. / Monthly.  Influenza vaccine. / Every year.  Tetanus, diphtheria, and acellular pertussis (Tdap/Td) vaccine.** / 1 dose of Td every 10 years.  Varicella vaccine.** / Consult your health care provider.  Zoster vaccine.** / 1 dose for adults aged 79 years or older.  Pneumococcal 13-valent conjugate (PCV13) vaccine.** / Consult your health care provider.  Pneumococcal polysaccharide (PPSV23) vaccine.** / 1 dose for all adults aged 82 years and older.  Meningococcal vaccine.** /  Consult your health care provider.  Hepatitis A vaccine.** / Consult your health care provider.  Hepatitis B vaccine.** / Consult your health care provider.  Haemophilus influenzae type b (Hib) vaccine.** / Consult your health care provider. ** Family history and personal history of risk and conditions may change your health care provider's recommendations. Document Released: 08/09/2001 Document Revised: 06/18/2013 Document Reviewed: 11/08/2010 [facility] Patient Information 2015 [place], [place]. This information is not intended to replace advice given to you by your health care provider. Make sure you discuss any questions you have with your health care provider.

## 2014-01-03 NOTE — Progress Notes (Signed)
Subjective:    [name] is a 80 y.o. female who presents for Medicare Initial Annual Wellness Visit.  She also has discharge from [facility] 12/30/13 for exacerbation of CHF and hypokalemia.  During hospitalization INR was subtherapeutic and warfarin was increased to 51m 1 tablet daily at discharge though patient is unsure if she is taking 476mor 45m10maily.    Preventive Screening-Counseling & Management  Tobacco History  Smoking status  . Former Smoker -- 1.50 packs/day for 15 years  . Types: Cigarettes  . Quit date: 05/27/1986  Smokeless tobacco  . Never Used    Comment: Smoked 1.5 packs per day for 15 years, quit in 1995.     Current Problems (verified) Patient Active Problem List   Diagnosis Date Noted  . CHF exacerbation 12/28/2013  . CHF (congestive heart failure) 12/28/2013  . Atypical chest pain 12/18/2013  . Upper airway cough syndrome 05/20/2013  . Pain in joint, ankle and foot 12/06/2012  . Olecranon bursitis of left elbow 08/26/2012  . Scalp lesion 08/26/2012  . Counseling regarding advanced directives 08/26/2012  . DM type 2 causing CKD stage 3 07/30/2012  . Sacral fracture, closed 07/22/2012  . Ataxia 07/20/2012  . Weakness of both legs 07/20/2012  . Falls frequently 07/20/2012  . RBBB 08/25/2011  . CAD, remote RCA PCI, subsequently noted to be occluded, last cath 8/11 08/25/2011  . PVD (peripheral vascular disease), moderate carotid and LSCA disease 08/25/2011  . Chronic anticoagulation, on coumadin 08/25/2011  . HTN (hypertension) 08/22/2011  . AK (actinic keratosis) 07/17/2011  . Shoulder pain 04/03/2011  . Gait instability 10/11/2010  . PAF, recurrance this admission, on chronic Amio/ Coumadin 07/06/2010  . COPD with emphysema 06/16/2010  . Chronic respiratory failure with hypoxia 06/07/2010  . HYPERLIPIDEMIA, statin intol 02/11/2010    Medications Prior to Visit Current Outpatient Prescriptions on File Prior to Visit  Medication Sig  Dispense Refill  . ALPRAZolam (XANAX) 0.25 MG tablet Take 0.125-0.25 mg by mouth 2 (two) times daily as needed for anxiety.      . aMarland Kitcheniodarone (PACERONE) 100 MG tablet Take 100 mg by mouth every morning.       . aMarland Kitchenpirin 81 MG chewable tablet Chew 81 mg by mouth daily.      . aMarland Kitchenorvastatin (LIPITOR) 40 MG tablet Take 40 mg by mouth at bedtime.      . Azelastine HCl 0.15 % SOLN Place 1 spray into both nostrils 2 (two) times daily as needed (congestion).      . budesonide-formoterol (SYMBICORT) 160-4.5 MCG/ACT inhaler Inhale 1 puff into the lungs 2 (two) times daily.      . fluticasone (FLONASE) 50 MCG/ACT nasal spray Place 1 spray into both nostrils 2 (two) times daily.  16 g  6  . furosemide (LASIX) 40 MG tablet Take 40 mg by mouth See admin instructions. Take 1 tablet (40 mg) twice daily, may take an extra dose as needed for fluid buildup (3 1/2 pound weight gain)      . glucosamine-chondroitin 500-400 MG tablet Take 1 tablet by mouth 2 (two) times daily.      . gMarland KitchenaiFENesin (MUCINEX) 600 MG 12 hr tablet Take 2 tablets (1,200 mg total) by mouth 2 (two) times daily as needed for cough or to loosen phlegm.      . insulin lispro (HUMALOG KWIKPEN) 100 UNIT/ML KiwkPen Inject 0-8 Units into the skin 3 (three) times daily before meals. Per sliding scale      .  Ipratropium-Albuterol (COMBIVENT) 20-100 MCG/ACT AERS respimat Inhale 1 puff into the lungs every 6 (six) hours as needed for wheezing or shortness of breath.       . isosorbide mononitrate (IMDUR) 60 MG 24 hr tablet Take 1 tablet (60 mg total) by mouth daily.  30 tablet  3  . metoprolol succinate (TOPROL-XL) 25 MG 24 hr tablet Take 12.5 mg by mouth daily.      Marland Kitchen NIFEdipine (NIFEDICAL XL) 30 MG 24 hr tablet Take 30 mg by mouth daily.      . nitroGLYCERIN (NITROSTAT) 0.4 MG SL tablet Place 1 tablet (0.4 mg total) under the tongue every 5 (five) minutes as needed for chest pain.  25 tablet  5  . pantoprazole (PROTONIX) 40 MG tablet Take 1 tablet (40 mg  total) by mouth 2 (two) times daily.  60 tablet  1  . potassium chloride SA (K-DUR,KLOR-CON) 20 MEQ tablet Take 1 tablet (20 mEq total) by mouth daily.  30 tablet  0  . senna-docusate (SENOKOT-S) 8.6-50 MG per tablet Take 1 tablet by mouth 2 (two) times daily.      . simethicone (MYLICON) 80 MG chewable tablet Chew 80 mg by mouth at bedtime as needed for flatulence.      . vitamin C (ASCORBIC ACID) 500 MG tablet Take 500 mg by mouth at bedtime.       Marland Kitchen warfarin (COUMADIN) 5 MG tablet Take 1 tablet (5 mg total) by mouth daily after supper.  30 tablet  0  . Cholecalciferol (VITAMIN D PO) Take 1 capsule by mouth daily.       . Multiple Vitamins-Minerals (PRESERVISION/LUTEIN) CAPS Take 1 capsule by mouth daily.        No current facility-administered medications on file prior to visit.    Current Medications (verified) Current Outpatient Prescriptions  Medication Sig Dispense Refill  . ALPRAZolam (XANAX) 0.25 MG tablet Take 0.125-0.25 mg by mouth 2 (two) times daily as needed for anxiety.      Marland Kitchen amiodarone (PACERONE) 100 MG tablet Take 100 mg by mouth every morning.       Marland Kitchen aspirin 81 MG chewable tablet Chew 81 mg by mouth daily.      Marland Kitchen atorvastatin (LIPITOR) 40 MG tablet Take 40 mg by mouth at bedtime.      . Azelastine HCl 0.15 % SOLN Place 1 spray into both nostrils 2 (two) times daily as needed (congestion).      . budesonide-formoterol (SYMBICORT) 160-4.5 MCG/ACT inhaler Inhale 1 puff into the lungs 2 (two) times daily.      . fluticasone (FLONASE) 50 MCG/ACT nasal spray Place 1 spray into both nostrils 2 (two) times daily.  16 g  6  . furosemide (LASIX) 40 MG tablet Take 40 mg by mouth See admin instructions. Take 1 tablet (40 mg) twice daily, may take an extra dose as needed for fluid buildup (3 1/2 pound weight gain)      . glucosamine-chondroitin 500-400 MG tablet Take 1 tablet by mouth 2 (two) times daily.      Marland Kitchen guaiFENesin (MUCINEX) 600 MG 12 hr tablet Take 2 tablets (1,200 mg total)  by mouth 2 (two) times daily as needed for cough or to loosen phlegm.      . insulin detemir (LEVEMIR) 100 UNIT/ML injection Inject 16 Units into the skin every morning.      . insulin lispro (HUMALOG KWIKPEN) 100 UNIT/ML KiwkPen Inject 0-8 Units into the skin 3 (three) times daily before meals.  Per sliding scale      . Ipratropium-Albuterol (COMBIVENT) 20-100 MCG/ACT AERS respimat Inhale 1 puff into the lungs every 6 (six) hours as needed for wheezing or shortness of breath.       . isosorbide mononitrate (IMDUR) 60 MG 24 hr tablet Take 1 tablet (60 mg total) by mouth daily.  30 tablet  3  . metoprolol succinate (TOPROL-XL) 25 MG 24 hr tablet Take 12.5 mg by mouth daily.      Marland Kitchen NIFEdipine (NIFEDICAL XL) 30 MG 24 hr tablet Take 30 mg by mouth daily.      . nitroGLYCERIN (NITROSTAT) 0.4 MG SL tablet Place 1 tablet (0.4 mg total) under the tongue every 5 (five) minutes as needed for chest pain.  25 tablet  5  . pantoprazole (PROTONIX) 40 MG tablet Take 1 tablet (40 mg total) by mouth 2 (two) times daily.  60 tablet  1  . potassium chloride SA (K-DUR,KLOR-CON) 20 MEQ tablet Take 1 tablet (20 mEq total) by mouth daily.  30 tablet  0  . senna-docusate (SENOKOT-S) 8.6-50 MG per tablet Take 1 tablet by mouth 2 (two) times daily.      . simethicone (MYLICON) 80 MG chewable tablet Chew 80 mg by mouth at bedtime as needed for flatulence.      . vitamin C (ASCORBIC ACID) 500 MG tablet Take 500 mg by mouth at bedtime.       Marland Kitchen warfarin (COUMADIN) 5 MG tablet Take 1 tablet (5 mg total) by mouth daily after supper.  30 tablet  0  . Cholecalciferol (VITAMIN D PO) Take 1 capsule by mouth daily.       . Multiple Vitamins-Minerals (PRESERVISION/LUTEIN) CAPS Take 1 capsule by mouth daily.        No current facility-administered medications for this visit.     Allergies (verified) Atorvastatin; Codeine; Morphine; Other; Rosuvastatin; and Iohexol   PAST HISTORY  Family History Family History  Problem Relation  Age of Onset  . Cancer Mother     Skin  . Heart disease Mother   . Emphysema Father   . Heart disease Father   . Congestive Heart Failure Father   . Heart disease Brother   . Heart disease Brother   . Early death Sister     Social History History  Substance Use Topics  . Smoking status: Former Smoker -- 1.50 packs/day for 15 years    Types: Cigarettes    Quit date: 05/27/1986  . Smokeless tobacco: Never Used     Comment: Smoked 1.5 packs per day for 15 years, quit in 1995.  Marland Kitchen Alcohol Use: Yes     Comment: Occasional, 1-2 glasses of wine daily     Are there smokers in your home (other than you)? No  Risk Factors Current exercise habits: The patient does not participate in regular exercise at present.  Dietary issues discussed: CHO consistant diet recommended   Cardiac risk factors: advanced age (older than 37 for men, 44 for women), diabetes mellitus, dyslipidemia, hypertension, microalbuminuria and sedentary lifestyle.  Depression Screen (Note: if answer to either of the following is "Yes", a more complete depression screening is indicated)   Over the past 2 weeks, have you felt down, depressed or hopeless? No  Over the past 2 weeks, have you felt little interest or pleasure in doing things? No  Have you lost interest or pleasure in daily life? No  Do you often feel hopeless? No  Do you cry easily over simple  problems? No  Activities of Daily Living In your present state of health, do you have any difficulty performing the following activities?:  Driving? Yes - does not drive Managing money?  No Feeding yourself? No Getting from bed to chair? No Climbing a flight of stairs? No Preparing food and eating?: No Bathing or showering? No Getting dressed: No Getting to the toilet? No Using the toilet:No Moving around from place to place: No In the past year have you fallen or had a near fall?:No   Are you sexually active?  No  Do you have more than one partner?   No  Hearing Difficulties: Yes Do you often ask people to speak up or repeat themselves? Yes Do you experience ringing or noises in your ears? No Do you have difficulty understanding soft or whispered voices? No   Do you feel that you have a problem with memory? No  Do you often misplace items? No  Do you feel safe at home?  Yes  Cognitive Testing  Alert? Yes  Normal Appearance?Yes  Oriented to person? Yes  Place? Yes   Time? Yes  Recall of three objects?  No  Can perform simple calculations? Yes  Displays appropriate judgment?Yes - but over the last few months she has gotten confused about medications  Can read the correct time from a watch face?Yes   Advanced Directives have been discussed with the patient? Yes  List the Names of Other Physician/Practitioners you currently use: 1.  hocherin - cardiologist 2.  sood - Pulmonologist  Indicate any recent Medical Services you may have received from other than [facility] providers in the past year (date may be approximate).  Immunization History  Administered Date(s) Administered  . Influenza Split 04/01/2011, 03/12/2012  . Influenza Whole 03/31/2010  . Influenza,inj,Quad PF,36+ Mos 04/01/2013  . Pneumococcal Conjugate-13 01/03/2014    Screening Tests Health Maintenance  Topic Date Due  . Tetanus/tdap  [birth year]27/1949  . Colonoscopy  02/28/1980  . Zostavax  02/27/1990  . Pneumococcal Polysaccharide Vaccine Age 48 And Over  02/28/1995  . Influenza Vaccine  01/25/2014   Last eye exam was 2 years ago - by Dr Zigmund Daniel.  Per patient she is legally blind and was told by Dr Zigmund Daniel that eye sight would not get worse  All answers were reviewed with the patient and necessary referrals were made:  Cherre Robins, [facility]   01/03/2014   History reviewed: allergies, current medications, past family history, past medical history, past social history, past surgical history and problem list    Objective:      Body mass index is 28.92  kg/(m^2). BP 120/60  Pulse 60  Ht '5\' 1"'  (1.549 m)  Wt 153 lb (69.4 kg)  BMI 28.92 kg/m2    Assessment:     Initial Annual Wellness Visit  Subtherapeutic anticoagulation     Plan:     During the course of the visit the patient was educated and counseled about appropriate screening and preventive services including:    Pneumococcal vaccine   Influenza vaccine  Hepatitis B vaccine  Td vaccine  Screening electrocardiogram  Screening mammography  Screening Pap smear and pelvic exam   Bone densitometry screening  Colorectal cancer screening  Diabetes screening  Glaucoma screening  Nutrition counseling   Advanced directives: patient given [facility] packet  Prevnar 13 given today  Looked into Tetnus vaccine - $57.92 - patient postponed  Referral sent for DEXA Anticoagulation Dose Instructions as of 01/03/2014     Nancy Fetter  Mon Tue Wed Thu Fri Sat   New Dose 5 mg 5 mg 5 mg 5 mg 5 mg 5 mg 5 mg    Description       Take 1 tablet daily of warfarin 69m.      Orders Placed This Encounter  Procedures  . DG Bone Density    Standing Status: Future     Number of Occurrences:      Standing Expiration Date: 03/07/2015    Order Specific Question:  Reason for Exam (SYMPTOM  OR DIAGNOSIS REQUIRED)    Answer:  osteoporosis screening    Order Specific Question:  Preferred imaging location?    Answer:  Internal  . Pneumococcal conjugate vaccine 13-valent  . BMP8+EGFR    Diet review for nutrition referral?  Declined due to financial reasons.   Patient Instructions (the written plan) was given to the patient.  Medicare Attestation I have personally reviewed: The patient's medical and social history Their use of alcohol, tobacco or illicit drugs Their current medications and supplements The patient's functional ability including ADLs,fall risks, home safety risks, cognitive, and hearing and visual impairment Diet and physical activities Evidence for  depression or mood disorders  The patient's weight, height, BMI, and BP/HR have been recorded in the chart.  I have made referrals, counseling, and provided education to the patient based on review of the above and I have provided the patient with a written personalized care plan for preventive services.     [facility] Robins [facility]  01/03/2014

## 2014-01-04 LAB — BMP8+EGFR
BUN/Creatinine Ratio: 23 (ref 11–26)
BUN: 38 mg/dL — AB (ref 8–27)
CALCIUM: 9.4 mg/dL (ref 8.7–10.3)
CO2: 26 mmol/L (ref 18–29)
CREATININE: 1.66 mg/dL — AB (ref 0.57–1.00)
Chloride: 102 mmol/L (ref 97–108)
GFR calc Af Amer: 33 mL/min/{1.73_m2} — ABNORMAL LOW (ref >59)
GFR calc non Af Amer: 28 mL/min/{1.73_m2} — ABNORMAL LOW (ref >59)
GLUCOSE: 116 mg/dL — AB (ref 65–99)
Potassium: 5.2 mmol/L (ref 3.5–5.2)
Sodium: 143 mmol/L (ref 134–144)

## 2014-01-07 ENCOUNTER — Encounter: Payer: Self-pay | Admitting: Cardiology

## 2014-01-07 ENCOUNTER — Ambulatory Visit (INDEPENDENT_AMBULATORY_CARE_PROVIDER_SITE_OTHER): Payer: Medicare Other | Admitting: Cardiology

## 2014-01-07 VITALS — BP 120/58 | HR 58 | Ht 60.0 in | Wt 152.2 lb

## 2014-01-07 DIAGNOSIS — I5032 Chronic diastolic (congestive) heart failure: Secondary | ICD-10-CM

## 2014-01-07 NOTE — Progress Notes (Signed)
Patient ID: [name], female   DOB: 1928-02-07, 80 y.o.   MRN: [id number]    01/07/2014 [name]   07-Apr-1928  [id number]  Primary Physicia Redge Gainer, MD Primary Cardiologist: Dr. Gwenlyn Found  HPI:  She has a history of CAD status post cath in 2006 revealing an occluded RCA with left-to-right collaterals and otherwise noncritical CAD with EF of 25% to 30% at that time. EF subsequently improved to normal (EF 55-60%) by 2D echo. Cath performed January 25, 2010, showed unchanged anatomy. Her other problems include hypertension, hyperlipidemia and insulin-dependent diabetes. She has COPD on home O2, quit smoking many years ago. She has moderate carotid disease right greater than left which we are following by duplex ultrasound. She is neurologically asymptomatic. She has had paroxysmal A-fib in the past on Coumadin anticoagulation. Dr. Halford Chessman follows her pulmonary status. She was last evaluated by Dr. Gwenlyn Found on 11/26/13 and was felt to be stable from a cardiac standpoint.   She presents back to clinic today for post hospital followup. She was recently admitted to [facility] for acute on chronic CHF exacerbation. Initial presentation was dyspnea as well as lower extremity edema. She was not in atrial fibrillation. She was under the primary care of internal medicine. [place] was not consulted during her admission and the patient was upset that we were not notified and were not consulted for recommendations.  Per hospital records, she was treated with IV Lasix. She had good diuresis and was transitioned back to PO Lasix at a dose of 40 mg BID.  Repeat 2-D echocardiogram revealed normal systolic function with an EF of 55-60%. She was also noted to have grade 2 diastolic dysfunction. She was discharged home on 12/30/2013. She was instructed to followup with our office.  Today in clinic, she is accompanied by her daughter-in-law. She states that she has done well since discharge. She denies  recurrent dyspnea and no lower extremity edema. No orthopnea or PND. She also denies chest pain. She states that she has been fully compliant with her medications and adherent to a low-sodium diet.   Current Outpatient Prescriptions  Medication Sig Dispense Refill  . ALPRAZolam (XANAX) 0.25 MG tablet Take 0.125-0.25 mg by mouth 2 (two) times daily as needed for anxiety.      Marland Kitchen amiodarone (PACERONE) 100 MG tablet Take 100 mg by mouth every morning.       Marland Kitchen aspirin 81 MG chewable tablet Chew 81 mg by mouth daily.      Marland Kitchen atorvastatin (LIPITOR) 40 MG tablet Take 40 mg by mouth at bedtime.      . Azelastine HCl 0.15 % SOLN Place 1 spray into both nostrils 2 (two) times daily as needed (congestion).      . budesonide-formoterol (SYMBICORT) 160-4.5 MCG/ACT inhaler Inhale 1 puff into the lungs 2 (two) times daily.      . Cholecalciferol (VITAMIN D PO) Take 1 capsule by mouth daily.       . fluticasone (FLONASE) 50 MCG/ACT nasal spray Place 1 spray into both nostrils 2 (two) times daily.  16 g  6  . furosemide (LASIX) 40 MG tablet Take 40 mg by mouth See admin instructions. Take 1 tablet (40 mg) twice daily, may take an extra dose as needed for fluid buildup (3 1/2 pound weight gain)      . glucosamine-chondroitin 500-400 MG tablet Take 1 tablet by mouth 2 (two) times daily.      Marland Kitchen guaiFENesin (MUCINEX) 600 MG 12  hr tablet Take 2 tablets (1,200 mg total) by mouth 2 (two) times daily as needed for cough or to loosen phlegm.      . insulin detemir (LEVEMIR) 100 UNIT/ML injection Inject 16 Units into the skin every morning.      . insulin lispro (HUMALOG KWIKPEN) 100 UNIT/ML KiwkPen Inject 0-8 Units into the skin 3 (three) times daily before meals. Per sliding scale      . Ipratropium-Albuterol (COMBIVENT) 20-100 MCG/ACT AERS respimat Inhale 1 puff into the lungs every 6 (six) hours as needed for wheezing or shortness of breath.       . isosorbide mononitrate (IMDUR) 60 MG 24 hr tablet Take 1 tablet (60 mg  total) by mouth daily.  30 tablet  3  . metoprolol succinate (TOPROL-XL) 25 MG 24 hr tablet Take 12.5 mg by mouth daily.      . Multiple Vitamins-Minerals (PRESERVISION/LUTEIN) CAPS Take 1 capsule by mouth daily.       Marland Kitchen NIFEdipine (NIFEDICAL XL) 30 MG 24 hr tablet Take 30 mg by mouth daily.      . nitroGLYCERIN (NITROSTAT) 0.4 MG SL tablet Place 1 tablet (0.4 mg total) under the tongue every 5 (five) minutes as needed for chest pain.  25 tablet  5  . pantoprazole (PROTONIX) 40 MG tablet Take 1 tablet (40 mg total) by mouth 2 (two) times daily.  60 tablet  1  . potassium chloride SA (K-DUR,KLOR-CON) 20 MEQ tablet Take 1 tablet (20 mEq total) by mouth daily.  30 tablet  0  . senna-docusate (SENOKOT-S) 8.6-50 MG per tablet Take 1 tablet by mouth 2 (two) times daily.      . simethicone (MYLICON) 80 MG chewable tablet Chew 80 mg by mouth at bedtime as needed for flatulence.      . vitamin C (ASCORBIC ACID) 500 MG tablet Take 500 mg by mouth at bedtime.       Marland Kitchen warfarin (COUMADIN) 5 MG tablet Take 1 tablet (5 mg total) by mouth daily after supper.  30 tablet  0   No current facility-administered medications for this visit.    Allergies  Allergen Reactions  . Atorvastatin Other (See Comments)     muscle aches  . Codeine Nausea And Vomiting  . Morphine Nausea And Vomiting  . Other Other (See Comments)    OPIATES cause nausea and vomiting  . Rosuvastatin Other (See Comments)    muscle aches at high doses, held as of 12/2010 due to aches  . Iohexol Other (See Comments)     Desc: unknown reaction; allergic to iodine and contrast     History   Social History  . Marital Status: Widowed    Spouse Name: N/A    Number of Children: N/A  . Years of Education: N/A   Occupational History  . Not on file.   Social History Main Topics  . Smoking status: Former Smoker -- 1.50 packs/day for 15 years    Types: Cigarettes    Quit date: 05/27/1986  . Smokeless tobacco: Never Used     Comment:  Smoked 1.5 packs per day for 15 years, quit in 1995.  Marland Kitchen Alcohol Use: Yes     Comment: Occasional, 1-2 glasses of wine daily  . Drug Use: No  . Sexual Activity: No   Other Topics Concern  . Not on file   Social History Narrative   Widowed, lives with daughter     Review of Systems: General: negative for chills, fever, night  sweats or weight changes.  Cardiovascular: negative for chest pain, dyspnea on exertion, edema, orthopnea, palpitations, paroxysmal nocturnal dyspnea or shortness of breath Dermatological: negative for rash Respiratory: negative for cough or wheezing Urologic: negative for hematuria Abdominal: negative for nausea, vomiting, diarrhea, bright red blood per rectum, melena, or hematemesis Neurologic: negative for visual changes, syncope, or dizziness All other systems reviewed and are otherwise negative except as noted above.    Blood pressure 120/58, pulse 58, height 5' (1.524 m), weight 152 lb 3.2 oz (69.037 kg).  General appearance: alert, cooperative and no distress Neck: no JVD Lungs: clear to auscultation bilaterally Heart: regular rate and rhythm Extremities: no LEE Pulses: 2+ and symmetric Skin: warm and dry Neurologic: Grossly normal  EKG not performed  ASSESSMENT AND PLAN:   1. Chronic diastolic heart failure: Stable. She is euvolemic on physical exam and denies any symptoms and no signs of peripheral edema. Continue Lasix, 40 mg BID, along with supplemental potassium and beta blocker (metoprolol). Advise that she check weight daily and maintain a low-sodium diet.  2. Atrial fibrillation: In normal sinus rhythm. Continue amiodarone and metoprolol for rhythm/rate control. Continue warfarin for anticoagulation.  PLAN  Patient is doing well from a cardiac standpoint. No change in therapy. Continue current plan of care. F/u with Dr. Gwenlyn Found in December.   Arvilla Market 01/07/2014 2:33 PM

## 2014-01-07 NOTE — Telephone Encounter (Signed)
Patient has appt on 7/22 with Ronnald Collum

## 2014-01-07 NOTE — Patient Instructions (Signed)
Melinda Simmons,PA-C, has made no changes in your current therapy. Please continue all current medications.  Please follow-up with Dr Gwenlyn Found in December.

## 2014-01-10 ENCOUNTER — Ambulatory Visit (INDEPENDENT_AMBULATORY_CARE_PROVIDER_SITE_OTHER): Payer: Medicare Other | Admitting: Pharmacist

## 2014-01-10 ENCOUNTER — Encounter: Payer: Self-pay | Admitting: Pharmacist

## 2014-01-10 VITALS — Ht 60.0 in | Wt 149.0 lb

## 2014-01-10 DIAGNOSIS — Z7901 Long term (current) use of anticoagulants: Secondary | ICD-10-CM

## 2014-01-10 DIAGNOSIS — I4891 Unspecified atrial fibrillation: Secondary | ICD-10-CM

## 2014-01-10 LAB — POCT INR: INR: 3

## 2014-01-10 NOTE — Patient Instructions (Signed)
Anticoagulation Dose Instructions as of 01/10/2014     [name] Tue Wed Thu Fri Sat   New Dose 5 mg 2.5 mg 5 mg 5 mg 5 mg 2.5 mg 5 mg    Description       Warfarin 5mg  tablets take 1/2 tablet on mondays and fridays, all other days take 1 tablet.       INR was 3.0 today

## 2014-01-15 ENCOUNTER — Ambulatory Visit: Payer: Medicare Other | Admitting: Nurse Practitioner

## 2014-01-20 ENCOUNTER — Telehealth: Payer: Self-pay | Admitting: Family Medicine

## 2014-01-20 NOTE — Telephone Encounter (Signed)
Called [facility]ass about referral to Patient Assistance program - left message.  Also set up appt for Hospital follow up 01/28/14 at 2pm

## 2014-01-24 ENCOUNTER — Other Ambulatory Visit: Payer: Self-pay | Admitting: Pharmacist

## 2014-01-24 MED ORDER — INSULIN DETEMIR 100 UNIT/ML ~~LOC~~ SOLN: 16.0000 [IU] | Freq: Every morning | SUBCUTANEOUS | Status: DC

## 2014-01-24 MED ORDER — INSULIN ASPART 100 UNIT/ML ~~LOC~~ SOLN: SUBCUTANEOUS | Status: DC

## 2014-01-24 MED ORDER — IPRATROPIUM-ALBUTEROL 20-100 MCG/ACT IN AERS: 1.0000 | INHALATION_SPRAY | Freq: Four times a day (QID) | RESPIRATORY_TRACT | Status: AC | PRN

## 2014-01-24 MED ORDER — BUDESONIDE-FORMOTEROL FUMARATE 160-4.5 MCG/ACT IN AERO: 1.0000 | INHALATION_SPRAY | Freq: Two times a day (BID) | RESPIRATORY_TRACT | Status: AC

## 2014-01-24 NOTE — Telephone Encounter (Signed)
Referral made to [facility] program - Rx's for brand medications printed and faxed to [phone]

## 2014-01-28 ENCOUNTER — Ambulatory Visit (INDEPENDENT_AMBULATORY_CARE_PROVIDER_SITE_OTHER): Payer: Medicare Other | Admitting: Nurse Practitioner

## 2014-01-28 ENCOUNTER — Encounter: Payer: Self-pay | Admitting: Nurse Practitioner

## 2014-01-28 VITALS — BP 120/55 | HR 56 | Temp 97.4°F | Ht 60.0 in | Wt 149.0 lb

## 2014-01-28 DIAGNOSIS — M545 Low back pain, unspecified: Secondary | ICD-10-CM

## 2014-01-28 DIAGNOSIS — I509 Heart failure, unspecified: Secondary | ICD-10-CM

## 2014-01-28 DIAGNOSIS — Z09 Encounter for follow-up examination after completed treatment for conditions other than malignant neoplasm: Secondary | ICD-10-CM

## 2014-01-28 NOTE — Progress Notes (Signed)
   Subjective:    Patient ID: [name], female    DOB: 10/26/1927, 80 y.o.   MRN: [id number]  HPI Hospital follow up for edema in legs and shortness of breath last month.  Was given an increase of Lasix and no complaints at this time.  * Patient in c/o of low back pain- hurts her all the time- extr srtength tylenol that does not help.  Review of Systems  Constitutional: Negative.   Respiratory: Positive for shortness of breath.   Cardiovascular: Positive for leg swelling.  Psychiatric/Behavioral: Negative.        Objective:   Physical Exam  Constitutional: She is oriented to person, place, and time. She appears well-developed and well-nourished.  Cardiovascular: Normal rate, regular rhythm and normal heart sounds.   Pulmonary/Chest: Effort normal. She has rales.  Diminished breath sounds throughout.  Neurological: She is alert and oriented to person, place, and time.  Skin: Skin is warm and dry.  Psychiatric: She has a normal mood and affect. Her behavior is normal. Judgment and thought content normal.   BP 120/55  Pulse 56  Temp(Src) 97.4 F (36.3 C) (Oral)  Ht 5' (1.524 m)  Wt 149 lb (67.586 kg)  BMI 29.10 kg/m2        Assessment & Plan:  1. Midline low back pain without sciatica Moist heat Continue tylenol OTC  2. Hospital discharge follow-up 3. Chronic congestive heart failure, unspecified congestive heart failure type Continue lasix as rx Elevate legs when sitting If develops SOB RTO ASAP Daily weights- let me know if more than 2 lb weight gain in 1 day  [place], FNP

## 2014-01-28 NOTE — Patient Instructions (Signed)

## 2014-01-31 ENCOUNTER — Other Ambulatory Visit: Payer: Self-pay | Admitting: Family Medicine

## 2014-02-17 ENCOUNTER — Telehealth: Payer: Self-pay | Admitting: Pharmacist

## 2014-02-17 NOTE — Telephone Encounter (Signed)
I do not have any openings currently for Thursday and schedule is too pack to work in.  I opened appt for Friday 02/21/14 at 12:30pm.  Please contact patient with this appt.

## 2014-02-21 ENCOUNTER — Ambulatory Visit (INDEPENDENT_AMBULATORY_CARE_PROVIDER_SITE_OTHER): Payer: Medicare Other | Admitting: Pharmacist

## 2014-02-21 DIAGNOSIS — Z7901 Long term (current) use of anticoagulants: Secondary | ICD-10-CM

## 2014-02-21 DIAGNOSIS — E876 Hypokalemia: Secondary | ICD-10-CM

## 2014-02-21 DIAGNOSIS — I739 Peripheral vascular disease, unspecified: Secondary | ICD-10-CM

## 2014-02-21 LAB — POCT INR: INR: 1.7

## 2014-02-21 NOTE — Patient Instructions (Signed)
Anticoagulation Dose Instructions as of 02/21/2014     [name] Tue Wed Thu Fri Sat   New Dose 5 mg 2.5 mg 5 mg 5 mg 5 mg 5 mg 5 mg    Description       Decrease warfarin 5mg  tablets take 1/2 tablet on mondays, all other days take 1 tablet.       INR was 1.7 today

## 2014-02-22 LAB — BMP8+EGFR
BUN/Creatinine Ratio: 21 (ref 11–26)
BUN: 36 mg/dL — ABNORMAL HIGH (ref 8–27)
CO2: 26 mmol/L (ref 18–29)
Calcium: 9.7 mg/dL (ref 8.7–10.3)
Chloride: 102 mmol/L (ref 97–108)
Creatinine, Ser: 1.69 mg/dL — ABNORMAL HIGH (ref 0.57–1.00)
GFR calc non Af Amer: 28 mL/min/{1.73_m2} — ABNORMAL LOW (ref >59)
GFR, EST AFRICAN AMERICAN: 32 mL/min/{1.73_m2} — AB (ref >59)
GLUCOSE: 122 mg/dL — AB (ref 65–99)
Potassium: 4.3 mmol/L (ref 3.5–5.2)
SODIUM: 142 mmol/L (ref 134–144)

## 2014-02-24 ENCOUNTER — Telehealth: Payer: Self-pay | Admitting: Pharmacist

## 2014-02-24 ENCOUNTER — Other Ambulatory Visit: Payer: Self-pay | Admitting: *Deleted

## 2014-02-24 MED ORDER — PANTOPRAZOLE SODIUM 40 MG PO TBEC: 40.0000 mg | DELAYED_RELEASE_TABLET | Freq: Two times a day (BID) | ORAL | Status: DC

## 2014-02-24 NOTE — Telephone Encounter (Signed)
Serum creatinine stable but low. Potassium remains in normal range. Patient notified.

## 2014-02-27 ENCOUNTER — Other Ambulatory Visit: Payer: Self-pay | Admitting: *Deleted

## 2014-02-27 MED ORDER — PANTOPRAZOLE SODIUM 40 MG PO TBEC: 40.0000 mg | DELAYED_RELEASE_TABLET | Freq: Two times a day (BID) | ORAL | Status: DC

## 2014-03-12 ENCOUNTER — Other Ambulatory Visit: Payer: Self-pay

## 2014-03-12 MED ORDER — POTASSIUM CHLORIDE CRYS ER 20 MEQ PO TBCR: 20.0000 meq | EXTENDED_RELEASE_TABLET | Freq: Every day | ORAL | Status: DC

## 2014-03-14 ENCOUNTER — Other Ambulatory Visit: Payer: Self-pay | Admitting: Nurse Practitioner

## 2014-03-20 ENCOUNTER — Other Ambulatory Visit: Payer: Self-pay | Admitting: Cardiovascular Disease

## 2014-03-20 ENCOUNTER — Other Ambulatory Visit: Payer: Self-pay | Admitting: *Deleted

## 2014-03-20 NOTE — Telephone Encounter (Signed)
Rx was sent to pharmacy electronically. 

## 2014-03-27 ENCOUNTER — Other Ambulatory Visit: Payer: Self-pay | Admitting: Family Medicine

## 2014-03-27 ENCOUNTER — Other Ambulatory Visit: Payer: Self-pay | Admitting: Nurse Practitioner

## 2014-03-31 ENCOUNTER — Encounter (HOSPITAL_COMMUNITY): Payer: Medicare Other

## 2014-04-01 ENCOUNTER — Ambulatory Visit (HOSPITAL_COMMUNITY)
Admission: RE | Admit: 2014-04-01 | Discharge: 2014-04-01 | Disposition: A | Discharge status: Home / Self Care | Payer: Medicare Other | Source: Ambulatory Visit | Attending: Cardiovascular Disease | Admitting: Cardiovascular Disease

## 2014-04-01 DIAGNOSIS — I6529 Occlusion and stenosis of unspecified carotid artery: Secondary | ICD-10-CM

## 2014-04-01 DIAGNOSIS — I672 Cerebral atherosclerosis: Secondary | ICD-10-CM | POA: Diagnosis present

## 2014-04-01 DIAGNOSIS — I779 Disorder of arteries and arterioles, unspecified: Secondary | ICD-10-CM

## 2014-04-01 DIAGNOSIS — I6523 Occlusion and stenosis of bilateral carotid arteries: Secondary | ICD-10-CM

## 2014-04-01 NOTE — Progress Notes (Signed)
Carotid Duplex Completed. Mitchell Iwanicki, BS, RDMS, RVT  

## 2014-04-03 ENCOUNTER — Ambulatory Visit (INDEPENDENT_AMBULATORY_CARE_PROVIDER_SITE_OTHER): Payer: Medicare Other | Admitting: Pharmacist

## 2014-04-03 DIAGNOSIS — Z7901 Long term (current) use of anticoagulants: Secondary | ICD-10-CM

## 2014-04-03 DIAGNOSIS — I2510 Atherosclerotic heart disease of native coronary artery without angina pectoris: Secondary | ICD-10-CM

## 2014-04-03 DIAGNOSIS — Z23 Encounter for immunization: Secondary | ICD-10-CM

## 2014-04-03 LAB — POCT INR: INR: 1.8

## 2014-04-03 NOTE — Patient Instructions (Signed)
Anticoagulation Dose Instructions as of 04/03/2014     [name] Tue Wed Thu Fri Sat   New Dose 5 mg 2.5 mg 5 mg 5 mg 5 mg 5 mg 5 mg    Description       Start warfarin 5mg  tablets take 1/2 tablet on mondays, all other days take 1 tablet.       INR was 1.8 today

## 2014-04-04 ENCOUNTER — Encounter: Payer: Self-pay | Admitting: *Deleted

## 2014-04-04 ENCOUNTER — Telehealth: Payer: Self-pay | Admitting: *Deleted

## 2014-04-04 DIAGNOSIS — I6529 Occlusion and stenosis of unspecified carotid artery: Secondary | ICD-10-CM

## 2014-04-04 NOTE — Telephone Encounter (Signed)
Message copied by Chauncy Lean on Fri Apr 04, 2014  8:37 AM ------      Message from: Lorretta Harp      Created: Thu Apr 03, 2014  7:03 PM       No change from prior study. Repeat in 12 months. ------

## 2014-04-04 NOTE — Telephone Encounter (Signed)
Order placed for repeat carotid dopplers in 1 year  

## 2014-04-22 ENCOUNTER — Other Ambulatory Visit: Payer: Self-pay | Admitting: Family Medicine

## 2014-04-25 ENCOUNTER — Telehealth: Payer: Self-pay | Admitting: Family Medicine

## 2014-04-25 MED ORDER — GLUCOSE BLOOD VI STRP: ORAL_STRIP | Status: DC

## 2014-04-25 NOTE — Telephone Encounter (Signed)
Pharmacy was requiring a diagnosis. Sent to pharmacy. Patient notified

## 2014-04-28 ENCOUNTER — Other Ambulatory Visit: Payer: Self-pay | Admitting: *Deleted

## 2014-04-29 ENCOUNTER — Other Ambulatory Visit: Payer: Self-pay | Admitting: Family Medicine

## 2014-04-30 NOTE — Telephone Encounter (Signed)
Last seen 01/28/14, has follow up appt 05/09/14, last ordered 11/28/13 #30 no refills,call into [facility]

## 2014-04-30 NOTE — Telephone Encounter (Signed)
This is okay to refill 1 

## 2014-05-01 NOTE — Telephone Encounter (Signed)
Called to [facility] 

## 2014-05-09 ENCOUNTER — Other Ambulatory Visit: Payer: Self-pay | Admitting: *Deleted

## 2014-05-09 ENCOUNTER — Ambulatory Visit: Payer: Self-pay | Admitting: Family Medicine

## 2014-05-09 MED ORDER — NITROGLYCERIN 0.4 MG SL SUBL: 0.4000 mg | SUBLINGUAL_TABLET | SUBLINGUAL | Status: DC | PRN

## 2014-05-13 ENCOUNTER — Ambulatory Visit: Payer: Self-pay | Admitting: Family Medicine

## 2014-05-28 ENCOUNTER — Other Ambulatory Visit: Payer: Medicare Other

## 2014-05-28 ENCOUNTER — Ambulatory Visit: Payer: Medicare Other

## 2014-06-09 ENCOUNTER — Ambulatory Visit: Payer: Self-pay | Admitting: Family Medicine

## 2014-06-09 ENCOUNTER — Ambulatory Visit: Payer: Medicare Other | Admitting: Pulmonary Disease

## 2014-06-10 ENCOUNTER — Encounter: Payer: Self-pay | Admitting: Pulmonary Disease

## 2014-06-10 ENCOUNTER — Ambulatory Visit (INDEPENDENT_AMBULATORY_CARE_PROVIDER_SITE_OTHER): Payer: Medicare Other | Admitting: Pulmonary Disease

## 2014-06-10 VITALS — BP 142/88 | HR 57 | Ht 61.0 in | Wt 145.4 lb

## 2014-06-10 DIAGNOSIS — J432 Centrilobular emphysema: Secondary | ICD-10-CM

## 2014-06-10 DIAGNOSIS — R058 Other specified cough: Secondary | ICD-10-CM

## 2014-06-10 DIAGNOSIS — J9611 Chronic respiratory failure with hypoxia: Secondary | ICD-10-CM

## 2014-06-10 DIAGNOSIS — J439 Emphysema, unspecified: Secondary | ICD-10-CM

## 2014-06-10 DIAGNOSIS — R059 Cough, unspecified: Secondary | ICD-10-CM

## 2014-06-10 DIAGNOSIS — R05 Cough: Secondary | ICD-10-CM

## 2014-06-10 NOTE — Progress Notes (Signed)
Chief Complaint  Patient presents with  . Follow-up    Pt c/o incr PND. Pt reports using O2 as directed, qhs and prn during day. Using Symbicort BID. Pt denies cough, CP/tightness and SOB.     History of Present Illness: [name] is a 80 y.o. female former smoker with GOLD 4 COPD, and chronic respiratory failure with hypoxia.  She has not been using her oxygen all the time during the day.  She does use at night.  She gets weak and fatigued with activity, and has to stop to rest.    Otherwise she feels okay.  She has occasional cough.  She is not having wheeze, sputum, chest pain, or leg swelling.  She has been getting sinus congestion with post-nasal drip, and feels like she gets phlegm stuck in her throat.  Tests: PFT 06/08/10>>FEV1 0.93(62%), FEV1% 60, TLC 2.84(69%), DLCO 54%, +BD Echo 08/23/11>>mild LVH, EF 55 to 60%, mild AS, mild MR, mod/severe LA dilation ONO with 2.5 liters 05/07/12>>Test time 3 hrs 47 min. Mean SpO2 99%, low SpO2 96%. Echo 12/28/13 >> mild LVH, EF 55 to 01%, grade 2 diastolic dysfx  PMHx >> HTN. HLD, CAD, diastolic CHF, RBBB, PAF, DM, Anxiety, GERD  PSHx, Medications, Allergies, Fhx, Shx reviewed.  Physical Exam: Blood pressure 142/88, pulse 57, height 5\' 1"  (1.549 m), weight 65.953 kg (145 lb 6.4 oz), SpO2 93 %. Body mass index is 27.49 kg/(m^2).  General - No distress, in wheelchair HEENT - no sinus tenderness, no oral exudate, no LAN Cardiac - s1s2 Chest - no wheeze/rales Abdomen - soft, nontender Extremities - no edema Skin - no rashes Neurologic - normal strength Psychiatric - normal mood, behavior   Assessment/Plan:  COPD with emphysema. Plan: - she can continue symbicort with prn combivent  Chronic hypoxic respiratory failure. Explained to her the symptoms associate with low oxygen, and affect low oxygen can have on her function and health. Plan: - advise her to be more consistent about using 2.5 liters oxygen during the day with  activity - she is to continue 2.5 liters oxygen at night  Upper airway cough syndrome. Plan: - she can continue flonase, azelastine - advised she could try mucinex DM prn   Chesley Mires, MD [place] 06/10/2014, 12:01 PM Pager:  [phone] After 3pm call: [phone]

## 2014-06-10 NOTE — Patient Instructions (Signed)
Use oxygen more during the day Try using mucinex DM to help with cough and congestion Follow up in 6 months

## 2014-06-12 ENCOUNTER — Ambulatory Visit (INDEPENDENT_AMBULATORY_CARE_PROVIDER_SITE_OTHER): Payer: Medicare Other | Admitting: Family Medicine

## 2014-06-12 ENCOUNTER — Encounter: Payer: Self-pay | Admitting: Family Medicine

## 2014-06-12 VITALS — BP 125/68 | HR 67 | Temp 97.2°F | Ht 61.0 in | Wt 148.0 lb

## 2014-06-12 DIAGNOSIS — G4700 Insomnia, unspecified: Secondary | ICD-10-CM

## 2014-06-12 DIAGNOSIS — I509 Heart failure, unspecified: Secondary | ICD-10-CM

## 2014-06-12 DIAGNOSIS — Z7901 Long term (current) use of anticoagulants: Secondary | ICD-10-CM

## 2014-06-12 DIAGNOSIS — E785 Hyperlipidemia, unspecified: Secondary | ICD-10-CM

## 2014-06-12 DIAGNOSIS — J449 Chronic obstructive pulmonary disease, unspecified: Secondary | ICD-10-CM

## 2014-06-12 DIAGNOSIS — M159 Polyosteoarthritis, unspecified: Secondary | ICD-10-CM | POA: Insufficient documentation

## 2014-06-12 DIAGNOSIS — N183 Chronic kidney disease, stage 3 unspecified: Secondary | ICD-10-CM

## 2014-06-12 DIAGNOSIS — I480 Paroxysmal atrial fibrillation: Secondary | ICD-10-CM

## 2014-06-12 DIAGNOSIS — E1122 Type 2 diabetes mellitus with diabetic chronic kidney disease: Secondary | ICD-10-CM

## 2014-06-12 DIAGNOSIS — E1169 Type 2 diabetes mellitus with other specified complication: Secondary | ICD-10-CM

## 2014-06-12 DIAGNOSIS — M150 Primary generalized (osteo)arthritis: Secondary | ICD-10-CM

## 2014-06-12 DIAGNOSIS — E559 Vitamin D deficiency, unspecified: Secondary | ICD-10-CM

## 2014-06-12 LAB — POCT CBC
Granulocyte percent: 72.6 %G (ref 37–80)
HCT, POC: 34.2 % — AB (ref 37.7–47.9)
Hemoglobin: 10.8 g/dL — AB (ref 12.2–16.2)
Lymph, poc: 1.4 (ref 0.6–3.4)
MCH, POC: 29.9 pg (ref 27–31.2)
MCHC: 31.7 g/dL — AB (ref 31.8–35.4)
MCV: 94.3 fL (ref 80–97)
MPV: 7.3 fL (ref 0–99.8)
POC Granulocyte: 5.2 (ref 2–6.9)
POC LYMPH %: 19.8 % (ref 10–50)
Platelet Count, POC: 240 10*3/uL (ref 142–424)
RBC: 3.6 M/uL — AB (ref 4.04–5.48)
RDW, POC: 14 %
WBC: 7.1 10*3/uL (ref 4.6–10.2)

## 2014-06-12 LAB — POCT INR: INR: 2.1

## 2014-06-12 LAB — POCT GLYCOSYLATED HEMOGLOBIN (HGB A1C)

## 2014-06-12 NOTE — Progress Notes (Signed)
Subjective:    Patient ID: [name], female    DOB: 1927-11-16, 80 y.o.   MRN: [id number]  HPI Pt here for follow up and management of chronic medical problems. The patient comes to the visit today with her daughter-in-law. She has multiple complaints specifically arthralgias in the hands and right hip pain. She is not sleeping well. She also has some lesions on her arm which she would like checked. Her weight is stable previously it was 149 and is now 148. She also has a history of atrial fibrillation and is on several high risk medications including warfarin. She does have diabetes and we will check a hemoglobin A1c today.        Patient Active Problem List   Diagnosis Date Noted  . CHF exacerbation 12/28/2013  . CHF (congestive heart failure) 12/28/2013  . Atypical chest pain 12/18/2013  . Upper airway cough syndrome 05/20/2013  . Pain in joint, ankle and foot 12/06/2012  . Olecranon bursitis of left elbow 08/26/2012  . Scalp lesion 08/26/2012  . Counseling regarding advanced directives 08/26/2012  . DM type 2 causing CKD stage 3 07/30/2012  . Sacral fracture, closed 07/22/2012  . Ataxia 07/20/2012  . Weakness of both legs 07/20/2012  . Falls frequently 07/20/2012  . RBBB 08/25/2011  . CAD, remote RCA PCI, subsequently noted to be occluded, last cath 8/11 08/25/2011  . PVD (peripheral vascular disease), moderate carotid and LSCA disease 08/25/2011  . Chronic anticoagulation, on coumadin 08/25/2011  . HTN (hypertension) 08/22/2011  . AK (actinic keratosis) 07/17/2011  . Shoulder pain 04/03/2011  . Gait instability 10/11/2010  . PAF, recurrance this admission, on chronic Amio/ Coumadin 07/06/2010  . COPD with emphysema 06/16/2010  . Chronic respiratory failure with hypoxia 06/07/2010  . HYPERLIPIDEMIA, statin intol 02/11/2010   Outpatient Encounter Prescriptions as of 06/12/2014  Medication Sig  . ALPRAZolam (XANAX) 0.25 MG tablet TAKE 1/2 TO 1 TABLET 2 TIMES A DAY  AS NEEDED FOR ANXIETY  . amiodarone (PACERONE) 100 MG tablet Take 100 mg by mouth every morning.   Marland Kitchen aspirin 81 MG chewable tablet Chew 81 mg by mouth daily.  Marland Kitchen atorvastatin (LIPITOR) 40 MG tablet Take 40 mg by mouth at bedtime.  . Azelastine HCl 0.15 % SOLN Place 1 spray into both nostrils 2 (two) times daily as needed (congestion).  . budesonide-formoterol (SYMBICORT) 160-4.5 MCG/ACT inhaler Inhale 1 puff into the lungs 2 (two) times daily.  . Cholecalciferol (VITAMIN D PO) Take 1 capsule by mouth daily.   . fluticasone (FLONASE) 50 MCG/ACT nasal spray Place 1 spray into both nostrils 2 (two) times daily.  . furosemide (LASIX) 40 MG tablet Take 40 mg by mouth See admin instructions. Take 1 tablet (40 mg) twice daily, may take an extra dose as needed for fluid buildup (3 1/2 pound weight gain)  . glucosamine-chondroitin 500-400 MG tablet Take 1 tablet by mouth 2 (two) times daily.  Marland Kitchen glucose blood (RELION ULTIMA TEST) test strip USE TO CHECK GLUCOSE UP TO 4 TIMES DAILY  . guaiFENesin (MUCINEX) 600 MG 12 hr tablet Take 2 tablets (1,200 mg total) by mouth 2 (two) times daily as needed for cough or to loosen phlegm.  . insulin aspart (NOVOLOG) 100 UNIT/ML injection Inject 0 to 8 units up to 3 times a day prior to meal per sliding scale  . insulin detemir (LEVEMIR) 100 UNIT/ML injection Inject 0.16 mLs (16 Units total) into the skin every morning.  . insulin lispro (HUMALOG KWIKPEN) 100  UNIT/ML KiwkPen Inject 0-8 Units into the skin 3 (three) times daily before meals. Per sliding scale  . Ipratropium-Albuterol (COMBIVENT) 20-100 MCG/ACT AERS respimat Inhale 1 puff into the lungs every 6 (six) hours as needed for wheezing or shortness of breath.  . isosorbide mononitrate (IMDUR) 60 MG 24 hr tablet Take 1 tablet (60 mg total) by mouth daily.  . metoprolol succinate (TOPROL-XL) 25 MG 24 hr tablet Take 12.5 mg by mouth daily.  . metoprolol succinate (TOPROL-XL) 25 MG 24 hr tablet TAKE 1/2 TABLET DAILY  .  Multiple Vitamins-Minerals (PRESERVISION/LUTEIN) CAPS Take 1 capsule by mouth daily.   Marland Kitchen NIFEdipine (NIFEDICAL XL) 30 MG 24 hr tablet Take 30 mg by mouth daily.  . nitroGLYCERIN (NITROSTAT) 0.4 MG SL tablet Place 1 tablet (0.4 mg total) under the tongue every 5 (five) minutes as needed for chest pain.  . pantoprazole (PROTONIX) 40 MG tablet Take 1 tablet (40 mg total) by mouth 2 (two) times daily.  . potassium chloride SA (K-DUR,KLOR-CON) 20 MEQ tablet Take 1 tablet (20 mEq total) by mouth daily.  . potassium chloride SA (K-DUR,KLOR-CON) 20 MEQ tablet Take 1 tablet (20 mEq total) by mouth daily.  Marland Kitchen senna-docusate (SENOKOT-S) 8.6-50 MG per tablet Take 1 tablet by mouth 2 (two) times daily.  . simethicone (MYLICON) 80 MG chewable tablet Chew 80 mg by mouth at bedtime as needed for flatulence.  . vitamin C (ASCORBIC ACID) 500 MG tablet Take 500 mg by mouth at bedtime.   Marland Kitchen warfarin (COUMADIN) 5 MG tablet TAKE (1) TABLET DAILY AFTER SUPPER.    Review of Systems  Constitutional: Negative.        Not sleeping well  HENT: Negative.   Eyes: Negative.   Respiratory: Negative.   Cardiovascular: Negative.   Gastrointestinal: Negative.   Endocrine: Negative.   Genitourinary: Negative.   Musculoskeletal: Positive for arthralgias (right hip area and in hands).  Skin: Negative.        Skin lesion - bilat arms  Allergic/Immunologic: Negative.   Neurological: Negative.   Hematological: Negative.   Psychiatric/Behavioral: Negative.        Objective:   Physical Exam  Constitutional: She is oriented to person, place, and time. She appears well-developed and well-nourished. No distress.  Pleasant and alert  HENT:  Head: Normocephalic and atraumatic.  Right Ear: External ear normal.  Left Ear: External ear normal.  Nose: Nose normal.  Mouth/Throat: Oropharynx is clear and moist.  Well-hydrated  Eyes: Conjunctivae and EOM are normal. Pupils are equal, round, and reactive to light. Right eye  exhibits no discharge. Left eye exhibits no discharge. No scleral icterus.  Decreased vision  Neck: Normal range of motion. Neck supple. No thyromegaly present.  There are bilateral carotid bruits left greater than right  Cardiovascular: Normal rate, regular rhythm, normal heart sounds and intact distal pulses.  Exam reveals no gallop and no friction rub.   No murmur heard. The heart has a regular rate and rhythm at 60/m today.  Pulmonary/Chest: Effort normal and breath sounds normal. No respiratory distress. She has no wheezes. She has no rales. She exhibits no tenderness.  Decreased breath sounds bilaterally dry cough  Abdominal: Soft. Bowel sounds are normal. She exhibits no mass. There is no tenderness. There is no rebound and no guarding.  Obesity without masses or tenderness  Genitourinary:  There is a lump on the inner lower quadrant of the left breast about half inch by 1 inch in size. Both breasts were checked otherwise  and appeared normal. The axillary regions were clear of adenopathy.  Musculoskeletal: She exhibits no edema or tenderness.  Range of motion is hesitant because of arthritic issues  Lymphadenopathy:    She has no cervical adenopathy.  Neurological: She is alert and oriented to person, place, and time. She has normal reflexes.  Skin: Skin is warm and dry. No rash noted.  The skin on her lower feet was dry  Psychiatric: She has a normal mood and affect. Her behavior is normal. Judgment and thought content normal.  Nursing note and vitals reviewed.  BP 125/68 mmHg  Pulse 67  Temp(Src) 97.2 F (36.2 C) (Oral)  Ht '5\' 1"'  (1.549 m)  Wt 148 lb (67.132 kg)  BMI 27.98 kg/m2        Assessment & Plan:  1. DM type 2 causing CKD stage 3 - POCT CBC - POCT glycosylated hemoglobin (Hb A1C)  2. Hyperlipidemia associated with type 2 diabetes mellitus - POCT CBC - BMP8+EGFR  3. Chronic obstructive pulmonary disease, unspecified COPD, unspecified chronic bronchitis  type - POCT CBC  4. Chronic congestive heart failure, unspecified congestive heart failure type - POCT CBC - BMP8+EGFR - Hepatic function panel - Lipid panel  5. Vitamin D deficiency - POCT CBC - Vit D  25 hydroxy (rtn osteoporosis monitoring)  6. PAF (paroxysmal atrial fibrillation) - POCT INR; Standing - POCT INR  7. Primary osteoarthritis involving multiple joints  8. Insomnia  9. Chronic anticoagulation  Patient Instructions                        Medicare Annual Wellness Visit  [facility] and the medical providers at [facility] strive to bring you the best medical care.  In doing so we not only want to address your current medical conditions and concerns but also to detect new conditions early and prevent illness, disease and health-related problems.    Medicare offers a yearly Wellness Visit which allows our clinical staff to assess your need for preventative services including immunizations, lifestyle education, counseling to decrease risk of preventable diseases and screening for fall risk and other medical concerns.    This visit is provided free of charge (no copay) for all Medicare recipients. The clinical pharmacists at [facility] have begun to conduct these Wellness Visits which will also include a thorough review of all your medications.    As you primary medical provider recommend that you make an appointment for your Annual Wellness Visit if you have not done so already this year.  You may set up this appointment before you leave today or you may call back ([phone]) and schedule an appointment.  Please make sure when you call that you mention that you are scheduling your Annual Wellness Visit with the clinical pharmacist so that the appointment may be made for the proper length of time.     Continue current medications. Continue good therapeutic lifestyle changes which include good diet and exercise. Fall  precautions discussed with patient. If an FOBT was given today- please return it to our front desk. If you are over 65 years old - you may need Prevnar 88 or the adult Pneumonia vaccine.  Flu Shots will be available at our office starting mid- September. Please call and schedule a FLU CLINIC APPOINTMENT.   Continue to be careful and do not put yourself at risk for falling Take her medicines with plenty of fluids Try to avoid Motrin  or ibuprofen as this may increase her chances of bleeding and affect your kidney function Take Tylenol as needed for aches pains and fever Stay as active as possible and don't be in a hurry with your movement Return to clinic to get your mammogram because of the lump in your left breast Return the FOBT We will call you with the lab work results once those results are available Continue her Coumadin as you are doing and Tammy will let you know when it needs to be checked again Try melatonin 3 mg 1 at bedtime to see if this helps sleep       Anticoagulation Dose Instructions as of 06/12/2014      Dorene Grebe Tue Wed Thu Fri Sat   New Dose 5 mg 2.5 mg 5 mg 5 mg 5 mg 5 mg 5 mg    Description        Start warfarin 37m tablets take 1/2 tablet on mondays, all other days take 1 tablet.         DArrie SenateMD

## 2014-06-12 NOTE — Patient Instructions (Addendum)
Medicare Annual Wellness Visit  [facility] and the medical providers at [facility] strive to bring you the best medical care.  In doing so we not only want to address your current medical conditions and concerns but also to detect new conditions early and prevent illness, disease and health-related problems.    Medicare offers a yearly Wellness Visit which allows our clinical staff to assess your need for preventative services including immunizations, lifestyle education, counseling to decrease risk of preventable diseases and screening for fall risk and other medical concerns.    This visit is provided free of charge (no copay) for all Medicare recipients. The clinical pharmacists at [facility] have begun to conduct these Wellness Visits which will also include a thorough review of all your medications.    As you primary medical provider recommend that you make an appointment for your Annual Wellness Visit if you have not done so already this year.  You may set up this appointment before you leave today or you may call back ([phone]) and schedule an appointment.  Please make sure when you call that you mention that you are scheduling your Annual Wellness Visit with the clinical pharmacist so that the appointment may be made for the proper length of time.     Continue current medications. Continue good therapeutic lifestyle changes which include good diet and exercise. Fall precautions discussed with patient. If an FOBT was given today- please return it to our front desk. If you are over 75 years old - you may need Prevnar 70 or the adult Pneumonia vaccine.  Flu Shots will be available at our office starting mid- September. Please call and schedule a FLU CLINIC APPOINTMENT.   Continue to be careful and do not put yourself at risk for falling Take her medicines with plenty of fluids Try to avoid Motrin or ibuprofen as this  may increase her chances of bleeding and affect your kidney function Take Tylenol as needed for aches pains and fever Stay as active as possible and don't be in a hurry with your movement Return to clinic to get your mammogram because of the lump in your left breast Return the FOBT We will call you with the lab work results once those results are available Continue her Coumadin as you are doing and Tammy will let you know when it needs to be checked again Try melatonin 3 mg 1 at bedtime to see if this helps sleep       Anticoagulation Dose Instructions as of 06/12/2014      Dorene Grebe Tue Wed Thu Fri Sat   New Dose 5 mg 2.5 mg 5 mg 5 mg 5 mg 5 mg 5 mg    Description        Start warfarin 5mg  tablets take 1/2 tablet on mondays, all other days take 1 tablet.

## 2014-06-13 LAB — BMP8+EGFR
BUN / CREAT RATIO: 18 (ref 11–26)
BUN: 31 mg/dL — ABNORMAL HIGH (ref 8–27)
CALCIUM: 9.4 mg/dL (ref 8.7–10.3)
CO2: 28 mmol/L (ref 18–29)
Chloride: 101 mmol/L (ref 97–108)
Creatinine, Ser: 1.71 mg/dL — ABNORMAL HIGH (ref 0.57–1.00)
GFR calc Af Amer: 31 mL/min/{1.73_m2} — ABNORMAL LOW (ref >59)
GFR, EST NON AFRICAN AMERICAN: 27 mL/min/{1.73_m2} — AB (ref >59)
Glucose: 126 mg/dL — ABNORMAL HIGH (ref 65–99)
POTASSIUM: 4.4 mmol/L (ref 3.5–5.2)
SODIUM: 142 mmol/L (ref 134–144)

## 2014-06-13 LAB — HEPATIC FUNCTION PANEL
ALT: 20 IU/L (ref 0–32)
AST: 20 IU/L (ref 0–40)
Albumin: 3.9 g/dL (ref 3.5–4.7)
Alkaline Phosphatase: 85 IU/L (ref 39–117)
Bilirubin, Direct: 0.13 mg/dL (ref 0.00–0.40)
Total Bilirubin: 0.4 mg/dL (ref 0.0–1.2)
Total Protein: 6.3 g/dL (ref 6.0–8.5)

## 2014-06-13 LAB — LIPID PANEL
CHOLESTEROL TOTAL: 150 mg/dL (ref 100–199)
Chol/HDL Ratio: 1.9 ratio units (ref 0.0–4.4)
HDL: 80 mg/dL (ref >39)
LDL Calculated: 51 mg/dL (ref 0–99)
Triglycerides: 96 mg/dL (ref 0–149)
VLDL CHOLESTEROL CAL: 19 mg/dL (ref 5–40)

## 2014-06-13 LAB — VITAMIN D 25 HYDROXY (VIT D DEFICIENCY, FRACTURES): Vit D, 25-Hydroxy: 28.7 ng/mL — ABNORMAL LOW (ref 30.0–100.0)

## 2014-06-19 ENCOUNTER — Other Ambulatory Visit: Payer: Self-pay | Admitting: Family Medicine

## 2014-06-23 ENCOUNTER — Other Ambulatory Visit: Payer: Self-pay | Admitting: Nurse Practitioner

## 2014-06-24 ENCOUNTER — Other Ambulatory Visit: Payer: Self-pay | Admitting: Cardiovascular Disease

## 2014-06-24 NOTE — Telephone Encounter (Signed)
Rx(s) sent to pharmacy electronically.  

## 2014-06-30 ENCOUNTER — Other Ambulatory Visit: Payer: Self-pay | Admitting: Family Medicine

## 2014-06-30 NOTE — Telephone Encounter (Signed)
Last seen 06/12/14 DW[facility]  If approved route to nurse to call into [facility]

## 2014-07-01 ENCOUNTER — Other Ambulatory Visit: Payer: Self-pay | Admitting: Cardiovascular Disease

## 2014-07-01 NOTE — Telephone Encounter (Signed)
Rx(s) sent to pharmacy electronically.  

## 2014-07-17 ENCOUNTER — Ambulatory Visit (INDEPENDENT_AMBULATORY_CARE_PROVIDER_SITE_OTHER): Payer: Medicare Other | Admitting: Pharmacist

## 2014-07-17 ENCOUNTER — Telehealth: Payer: Self-pay | Admitting: Pharmacist

## 2014-07-17 DIAGNOSIS — I480 Paroxysmal atrial fibrillation: Secondary | ICD-10-CM | POA: Diagnosis not present

## 2014-07-17 DIAGNOSIS — Z7901 Long term (current) use of anticoagulants: Secondary | ICD-10-CM

## 2014-07-17 LAB — POCT INR: INR: 2.2

## 2014-07-17 NOTE — Patient Instructions (Signed)
Anticoagulation Dose Instructions as of 07/17/2014      [name] Tue Wed Thu Fri Sat   New Dose 5 mg 2.5 mg 5 mg 5 mg 5 mg 5 mg 5 mg    Description        Continue warfarin 5mg  tablets take 1/2 tablet on mondays, all other days take 1 tablet.       INR was 2.2 today

## 2014-07-21 ENCOUNTER — Other Ambulatory Visit: Payer: Self-pay | Admitting: Family Medicine

## 2014-07-21 MED ORDER — GLUCOSE BLOOD VI STRP: ORAL_STRIP | Status: DC

## 2014-07-21 NOTE — Telephone Encounter (Signed)
appt made

## 2014-07-21 NOTE — Telephone Encounter (Signed)
done

## 2014-07-22 ENCOUNTER — Encounter: Payer: Self-pay | Admitting: Family Medicine

## 2014-07-22 ENCOUNTER — Ambulatory Visit (INDEPENDENT_AMBULATORY_CARE_PROVIDER_SITE_OTHER): Payer: Medicare Other | Admitting: Family Medicine

## 2014-07-22 VITALS — BP 173/70 | HR 68 | Temp 97.4°F | Ht 61.0 in | Wt 143.8 lb

## 2014-07-22 DIAGNOSIS — L989 Disorder of the skin and subcutaneous tissue, unspecified: Secondary | ICD-10-CM | POA: Diagnosis not present

## 2014-07-22 NOTE — Progress Notes (Signed)
   Subjective:    Patient ID: [name], female    DOB: 1928/05/12, 81 y.o.   MRN: [id number]  HPI Patient c/o multiple skin lesions on arms, shoulders, legs, and right ear.  Review of Systems  Constitutional: Negative for fever.  HENT: Negative for ear pain.   Eyes: Negative for discharge.  Respiratory: Negative for cough.   Cardiovascular: Negative for chest pain.  Gastrointestinal: Negative for abdominal distention.  Endocrine: Negative for polyuria.  Genitourinary: Negative for difficulty urinating.  Musculoskeletal: Negative for gait problem and neck pain.  Skin: Negative for color change and rash.  Neurological: Negative for speech difficulty and headaches.  Psychiatric/Behavioral: Negative for agitation.       Objective:    BP 173/70 mmHg  Pulse 68  Temp(Src) 97.4 F (36.3 C) (Oral)  Ht 5\' 1"  (1.549 m)  Wt 143 lb 12.8 oz (65.227 kg)  BMI 27.18 kg/m2 Physical Exam  Constitutional: She is oriented to person, place, and time. She appears well-developed and well-nourished.  HENT:  Head: Normocephalic and atraumatic.  Mouth/Throat: Oropharynx is clear and moist.  Eyes: Pupils are equal, round, and reactive to light.  Neck: Normal range of motion. Neck supple.  Cardiovascular: Normal rate and regular rhythm.   No murmur heard. Pulmonary/Chest: Effort normal and breath sounds normal.  Abdominal: Soft. Bowel sounds are normal. There is no tenderness.  Neurological: She is alert and oriented to person, place, and time.  Skin: Skin is warm and dry.  Multiple SK lesions on arms and legs.  Psychiatric: She has a normal mood and affect.          Assessment & Plan:     ICD-9-CM ICD-10-CM   1. Skin lesion 709.9 L98.9 Ambulatory referral to Dermatology     No Follow-up on file.  Lysbeth Penner FNP

## 2014-08-18 ENCOUNTER — Other Ambulatory Visit: Payer: Self-pay | Admitting: Nurse Practitioner

## 2014-08-21 ENCOUNTER — Other Ambulatory Visit: Payer: Self-pay | Admitting: Nurse Practitioner

## 2014-08-21 ENCOUNTER — Ambulatory Visit (INDEPENDENT_AMBULATORY_CARE_PROVIDER_SITE_OTHER): Payer: Medicare Other | Admitting: Pharmacist

## 2014-08-21 DIAGNOSIS — Z7901 Long term (current) use of anticoagulants: Secondary | ICD-10-CM

## 2014-08-21 DIAGNOSIS — I480 Paroxysmal atrial fibrillation: Secondary | ICD-10-CM

## 2014-08-21 LAB — POCT INR: INR: 2.5

## 2014-08-21 NOTE — Patient Instructions (Signed)
Anticoagulation Dose Instructions as of 08/21/2014      [name] Tue Wed Thu Fri Sat   New Dose 5 mg 5 mg 5 mg 5 mg 5 mg 5 mg 0 mg    Description        Continue warfarin 5mg  tablets take 1 tablet daily except none on saturdays      INR was 2.5 today

## 2014-09-11 ENCOUNTER — Other Ambulatory Visit: Payer: Self-pay | Admitting: Cardiovascular Disease

## 2014-09-11 NOTE — Telephone Encounter (Signed)
Rx(s) sent to pharmacy electronically.  

## 2014-09-30 DIAGNOSIS — E1151 Type 2 diabetes mellitus with diabetic peripheral angiopathy without gangrene: Secondary | ICD-10-CM | POA: Diagnosis not present

## 2014-09-30 DIAGNOSIS — B351 Tinea unguium: Secondary | ICD-10-CM | POA: Diagnosis not present

## 2014-09-30 DIAGNOSIS — L84 Corns and callosities: Secondary | ICD-10-CM | POA: Diagnosis not present

## 2014-10-15 ENCOUNTER — Other Ambulatory Visit: Payer: Self-pay | Admitting: Family Medicine

## 2014-11-04 ENCOUNTER — Ambulatory Visit (INDEPENDENT_AMBULATORY_CARE_PROVIDER_SITE_OTHER): Payer: Medicare Other | Admitting: Family Medicine

## 2014-11-04 ENCOUNTER — Encounter: Payer: Self-pay | Admitting: Family Medicine

## 2014-11-04 VITALS — BP 142/64 | HR 62 | Temp 96.7°F | Ht 61.0 in | Wt 144.0 lb

## 2014-11-04 DIAGNOSIS — J449 Chronic obstructive pulmonary disease, unspecified: Secondary | ICD-10-CM | POA: Diagnosis not present

## 2014-11-04 DIAGNOSIS — I509 Heart failure, unspecified: Secondary | ICD-10-CM

## 2014-11-04 DIAGNOSIS — E559 Vitamin D deficiency, unspecified: Secondary | ICD-10-CM | POA: Diagnosis not present

## 2014-11-04 DIAGNOSIS — I480 Paroxysmal atrial fibrillation: Secondary | ICD-10-CM | POA: Diagnosis not present

## 2014-11-04 DIAGNOSIS — N183 Chronic kidney disease, stage 3 (moderate): Secondary | ICD-10-CM | POA: Diagnosis not present

## 2014-11-04 DIAGNOSIS — E1122 Type 2 diabetes mellitus with diabetic chronic kidney disease: Secondary | ICD-10-CM | POA: Diagnosis not present

## 2014-11-04 DIAGNOSIS — E785 Hyperlipidemia, unspecified: Secondary | ICD-10-CM

## 2014-11-04 DIAGNOSIS — E1169 Type 2 diabetes mellitus with other specified complication: Secondary | ICD-10-CM | POA: Diagnosis not present

## 2014-11-04 LAB — POCT INR: INR: 1.7

## 2014-11-04 LAB — POCT GLYCOSYLATED HEMOGLOBIN (HGB A1C): HEMOGLOBIN A1C: 7.3

## 2014-11-04 NOTE — Progress Notes (Signed)
Subjective:    Patient ID: [name], female    DOB: 28-Mar-1928, 81 y.o.   MRN: [id number]  HPI Pt here for follow up and management of chronic medical problems which includes hyperlipidemia and atrial fibrillation. She is taking medications regularly. The patient complains of some back pain and recent nosebleeds. Her most recent INR was today and the pro time was 1.7. She will be given an FOBT to return and we'll get her lab work done today.     Patient Active Problem List   Diagnosis Date Noted  . Primary osteoarthritis involving multiple joints 06/12/2014  . CHF exacerbation 12/28/2013  . CHF (congestive heart failure) 12/28/2013  . Atypical chest pain 12/18/2013  . Upper airway cough syndrome 05/20/2013  . Pain in joint, ankle and foot 12/06/2012  . Olecranon bursitis of left elbow 08/26/2012  . Scalp lesion 08/26/2012  . Counseling regarding advanced directives 08/26/2012  . DM type 2 causing CKD stage 3 07/30/2012  . Sacral fracture, closed 07/22/2012  . Ataxia 07/20/2012  . Weakness of both legs 07/20/2012  . Falls frequently 07/20/2012  . RBBB 08/25/2011  . CAD, remote RCA PCI, subsequently noted to be occluded, last cath 8/11 08/25/2011  . PVD (peripheral vascular disease), moderate carotid and LSCA disease 08/25/2011  . Chronic anticoagulation, on coumadin 08/25/2011  . HTN (hypertension) 08/22/2011  . AK (actinic keratosis) 07/17/2011  . Shoulder pain 04/03/2011  . Gait instability 10/11/2010  . PAF, recurrance this admission, on chronic Amio/ Coumadin 07/06/2010  . COPD with emphysema 06/16/2010  . Chronic respiratory failure with hypoxia 06/07/2010  . HYPERLIPIDEMIA, statin intol 02/11/2010   Outpatient Encounter Prescriptions as of 11/04/2014  Medication Sig  . ALPRAZolam (XANAX) 0.25 MG tablet TAKE 1/2 TO 1 TABLET 2 TIMES A DAY AS NEEDED FOR ANXIETY  . amiodarone (PACERONE) 200 MG tablet Take 0.5 tablets (100 mg total) by mouth daily.  Marland Kitchen aspirin 81 MG  chewable tablet Chew 81 mg by mouth daily.  Marland Kitchen atorvastatin (LIPITOR) 40 MG tablet Take 40 mg by mouth at bedtime.  . Azelastine HCl 0.15 % SOLN Place 1 spray into both nostrils 2 (two) times daily as needed (congestion).  . budesonide-formoterol (SYMBICORT) 160-4.5 MCG/ACT inhaler Inhale 1 puff into the lungs 2 (two) times daily.  . Cholecalciferol (VITAMIN D PO) Take 1 capsule by mouth daily.   . fluticasone (FLONASE) 50 MCG/ACT nasal spray Place 1 spray into both nostrils 2 (two) times daily.  . furosemide (LASIX) 40 MG tablet Take 1 tablet (40 mg total) by mouth daily. May take extra dose if weight increases 3.5 pounds in 24 hrs as needed for fluid and edema.  Marland Kitchen glucosamine-chondroitin 500-400 MG tablet Take 1 tablet by mouth 2 (two) times daily.  Marland Kitchen glucose blood (RELION ULTIMA TEST) test strip USE TO CHECK GLUCOSE UP TO 4 TIMES DAILY  . guaiFENesin (MUCINEX) 600 MG 12 hr tablet Take 2 tablets (1,200 mg total) by mouth 2 (two) times daily as needed for cough or to loosen phlegm.  . insulin aspart (NOVOLOG) 100 UNIT/ML injection Inject 0 to 8 units up to 3 times a day prior to meal per sliding scale  . insulin detemir (LEVEMIR) 100 UNIT/ML injection Inject 0.16 mLs (16 Units total) into the skin every morning.  . insulin lispro (HUMALOG KWIKPEN) 100 UNIT/ML KiwkPen Inject 0-8 Units into the skin 3 (three) times daily before meals. Per sliding scale  . Ipratropium-Albuterol (COMBIVENT) 20-100 MCG/ACT AERS respimat Inhale 1 puff into the  lungs every 6 (six) hours as needed for wheezing or shortness of breath.  . isosorbide mononitrate (IMDUR) 60 MG 24 hr tablet TAKE 1 TABLET DAILY  . metoprolol succinate (TOPROL-XL) 25 MG 24 hr tablet Take 12.5 mg by mouth daily.  . Multiple Vitamins-Minerals (PRESERVISION/LUTEIN) CAPS Take 1 capsule by mouth daily.   Marland Kitchen NIFEdipine (NIFEDICAL XL) 30 MG 24 hr tablet Take 1 tablet (30 mg total) by mouth daily.  . nitroGLYCERIN (NITROSTAT) 0.4 MG SL tablet Place 1  tablet (0.4 mg total) under the tongue every 5 (five) minutes as needed for chest pain.  . pantoprazole (PROTONIX) 40 MG tablet TAKE 1 TABLET TWICE A DAY  . potassium chloride SA (K-DUR,KLOR-CON) 20 MEQ tablet Take 1 tablet (20 mEq total) by mouth daily.  Marland Kitchen senna-docusate (SENOKOT-S) 8.6-50 MG per tablet Take 1 tablet by mouth 2 (two) times daily.  . simethicone (MYLICON) 80 MG chewable tablet Chew 80 mg by mouth at bedtime as needed for flatulence.  . vitamin C (ASCORBIC ACID) 500 MG tablet Take 500 mg by mouth at bedtime.   Marland Kitchen warfarin (COUMADIN) 5 MG tablet TAKE (1) TABLET DAILY AFTER SUPPER.   No facility-administered encounter medications on file as of 11/04/2014.      Review of Systems  Constitutional: Negative.   HENT: Negative.        Some recent nosebleeds  Eyes: Negative.   Respiratory: Negative.   Cardiovascular: Negative.   Gastrointestinal: Negative.   Endocrine: Negative.   Genitourinary: Negative.   Musculoskeletal: Positive for back pain (on- going).  Skin: Negative.   Allergic/Immunologic: Negative.   Neurological: Negative.   Hematological: Negative.   Psychiatric/Behavioral: Negative.        Objective:   Physical Exam  Constitutional: She is oriented to person, place, and time. She appears well-developed and well-nourished. No distress.  Agent is pleasant and alert and looks much younger than her stated age of 34  HENT:  Head: Normocephalic and atraumatic.  Right Ear: External ear normal.  Left Ear: External ear normal.  Nose: Nose normal.  Mouth/Throat: Oropharynx is clear and moist.  Eyes: Conjunctivae and EOM are normal. Pupils are equal, round, and reactive to light. Right eye exhibits no discharge. Left eye exhibits no discharge. No scleral icterus.  Neck: Normal range of motion. Neck supple. No thyromegaly present.  No carotid bruits or anterior cervical adenopathy  Cardiovascular: Normal rate, regular rhythm, normal heart sounds and intact distal  pulses.   No murmur heard. At 72/m and the rhythm is regular  Pulmonary/Chest: Effort normal and breath sounds normal. No respiratory distress. She has no wheezes. She has no rales. She exhibits no tenderness.  Abdominal: Soft. Bowel sounds are normal. She exhibits no mass. There is no tenderness. There is no rebound and no guarding.  No abdominal tenderness  Musculoskeletal: She exhibits no edema or tenderness.  The patient has a cane and her range of motion is hesitant  Lymphadenopathy:    She has no cervical adenopathy.  Neurological: She is alert and oriented to person, place, and time. She has normal reflexes. No cranial nerve deficit.  Skin: Skin is warm and dry. No rash noted.  Psychiatric: She has a normal mood and affect. Her behavior is normal. Judgment and thought content normal.  Nursing note and vitals reviewed.  BP 142/64 mmHg  Pulse 62  Temp(Src) 96.7 F (35.9 C) (Oral)  Ht _0  (1.549 m)  Wt 144 lb (65.318 kg)  BMI 27.22 kg/m2  Assessment & Plan:  1. PAF (paroxysmal atrial fibrillation) -Continue follow-up with cardiology and continue Coumadin - POCT INR - CBC with Differential/Platelet  2. DM type 2 causing CKD stage 3 -The hemoglobin A1c was good today and the patient should continue with her current treatment - BMP8+EGFR - POCT glycosylated hemoglobin (Hb A1C) - CBC with Differential/Platelet  3. Hyperlipidemia associated with type 2 diabetes mellitus -Continue with current dose of atorvastatin pending results of lab work - Hepatic function panel - Lipid panel  4. Chronic obstructive pulmonary disease, unspecified COPD, unspecified chronic bronchitis type -The patient has no complaints with her breathing today and she is doing well in this regard. - CBC with Differential/Platelet  5. Vitamin D deficiency -Continue current dose of vitamin D pending results of lab work - Vit D  25 hydroxy (rtn osteoporosis monitoring)  6. Chronic congestive  heart failure, unspecified congestive heart failure type -This is stable patient has no edema and she will follow-up with cardiology as planned - BMP8+EGFR - Hepatic function panel - CBC with Differential/Platelet  No orders of the defined types were placed in this encounter.   Patient Instructions                       Medicare Annual Wellness Visit  [facility] and the medical providers at [facility] strive to bring you the best medical care.  In doing so we not only want to address your current medical conditions and concerns but also to detect new conditions early and prevent illness, disease and health-related problems.    Medicare offers a yearly Wellness Visit which allows our clinical staff to assess your need for preventative services including immunizations, lifestyle education, counseling to decrease risk of preventable diseases and screening for fall risk and other medical concerns.    This visit is provided free of charge (no copay) for all Medicare recipients. The clinical pharmacists at [facility] have begun to conduct these Wellness Visits which will also include a thorough review of all your medications.    As you primary medical provider recommend that you make an appointment for your Annual Wellness Visit if you have not done so already this year.  You may set up this appointment before you leave today or you may call back ([phone]) and schedule an appointment.  Please make sure when you call that you mention that you are scheduling your Annual Wellness Visit with the clinical pharmacist so that the appointment may be made for the proper length of time.     Continue current medications. Continue good therapeutic lifestyle changes which include good diet and exercise. Fall precautions discussed with patient. If an FOBT was given today- please return it to our front desk. If you are over 45 years old - you may need Prevnar 64  or the adult Pneumonia vaccine.  Flu Shots are still available at our office. If you still haven't had one please call to set up a nurse visit to get one.   After your visit with [place] today you will receive a survey in the mail or online from [employer] regarding your care with [place]. Please take a moment to fill this out. Your feedback is very important to [place] as you can help [place] better understand your patient needs as well as improve your experience and satisfaction. WE CARE ABOUT YOU!!!    Reduce Protonix to one day for 2 weeks and then go back to  Twice a day--to see if this helps the loose bowel movements  Stay as active as possible Follow-up with cardiology as planned   Arrie Senate MD

## 2014-11-04 NOTE — Patient Instructions (Addendum)
Medicare Annual Wellness Visit  [facility] and the medical providers at [facility] strive to bring you the best medical care.  In doing so we not only want to address your current medical conditions and concerns but also to detect new conditions early and prevent illness, disease and health-related problems.    Medicare offers a yearly Wellness Visit which allows our clinical staff to assess your need for preventative services including immunizations, lifestyle education, counseling to decrease risk of preventable diseases and screening for fall risk and other medical concerns.    This visit is provided free of charge (no copay) for all Medicare recipients. The clinical pharmacists at [facility] have begun to conduct these Wellness Visits which will also include a thorough review of all your medications.    As you primary medical provider recommend that you make an appointment for your Annual Wellness Visit if you have not done so already this year.  You may set up this appointment before you leave today or you may call back ([phone]) and schedule an appointment.  Please make sure when you call that you mention that you are scheduling your Annual Wellness Visit with the clinical pharmacist so that the appointment may be made for the proper length of time.     Continue current medications. Continue good therapeutic lifestyle changes which include good diet and exercise. Fall precautions discussed with patient. If an FOBT was given today- please return it to our front desk. If you are over 75 years old - you may need Prevnar 50 or the adult Pneumonia vaccine.  Flu Shots are still available at our office. If you still haven't had one please call to set up a nurse visit to get one.   After your visit with [place] today you will receive a survey in the mail or online from [employer] regarding your care with [place]. Please take a moment to  fill this out. Your feedback is very important to [place] as you can help [place] better understand your patient needs as well as improve your experience and satisfaction. WE CARE ABOUT YOU!!!    Reduce Protonix to one day for 2 weeks and then go back to Twice a day--to see if this helps the loose bowel movements  Stay as active as possible Follow-up with cardiology as planned

## 2014-11-05 LAB — BMP8+EGFR
BUN/Creatinine Ratio: 19 (ref 11–26)
BUN: 31 mg/dL — AB (ref 8–27)
CO2: 28 mmol/L (ref 18–29)
Calcium: 9.7 mg/dL (ref 8.7–10.3)
Chloride: 101 mmol/L (ref 97–108)
Creatinine, Ser: 1.65 mg/dL — ABNORMAL HIGH (ref 0.57–1.00)
GFR calc non Af Amer: 28 mL/min/{1.73_m2} — ABNORMAL LOW (ref >59)
GFR, EST AFRICAN AMERICAN: 33 mL/min/{1.73_m2} — AB (ref >59)
Glucose: 124 mg/dL — ABNORMAL HIGH (ref 65–99)
Potassium: 4.9 mmol/L (ref 3.5–5.2)
SODIUM: 146 mmol/L — AB (ref 134–144)

## 2014-11-05 LAB — HEPATIC FUNCTION PANEL
ALT: 20 IU/L (ref 0–32)
AST: 17 IU/L (ref 0–40)
Albumin: 3.9 g/dL (ref 3.5–4.7)
Alkaline Phosphatase: 85 IU/L (ref 39–117)
BILIRUBIN TOTAL: 0.4 mg/dL (ref 0.0–1.2)
Bilirubin, Direct: 0.16 mg/dL (ref 0.00–0.40)
Total Protein: 6.4 g/dL (ref 6.0–8.5)

## 2014-11-05 LAB — LIPID PANEL
CHOLESTEROL TOTAL: 163 mg/dL (ref 100–199)
Chol/HDL Ratio: 1.6 ratio units (ref 0.0–4.4)
HDL: 100 mg/dL (ref >39)
LDL Calculated: 48 mg/dL (ref 0–99)
Triglycerides: 74 mg/dL (ref 0–149)
VLDL CHOLESTEROL CAL: 15 mg/dL (ref 5–40)

## 2014-11-05 LAB — CBC WITH DIFFERENTIAL/PLATELET
BASOS: 1 %
Basophils Absolute: 0.1 10*3/uL (ref 0.0–0.2)
EOS (ABSOLUTE): 0.1 10*3/uL (ref 0.0–0.4)
Eos: 2 %
Hematocrit: 34.4 % (ref 34.0–46.6)
Hemoglobin: 11.1 g/dL (ref 11.1–15.9)
Immature Grans (Abs): 0 10*3/uL (ref 0.0–0.1)
Immature Granulocytes: 0 %
Lymphocytes Absolute: 1.5 10*3/uL (ref 0.7–3.1)
Lymphs: 23 %
MCH: 30.2 pg (ref 26.6–33.0)
MCHC: 32.3 g/dL (ref 31.5–35.7)
MCV: 94 fL (ref 79–97)
MONOS ABS: 0.6 10*3/uL (ref 0.1–0.9)
Monocytes: 9 %
NEUTROS ABS: 4.3 10*3/uL (ref 1.4–7.0)
Neutrophils: 65 %
Platelets: 247 10*3/uL (ref 150–379)
RBC: 3.68 x10E6/uL — ABNORMAL LOW (ref 3.77–5.28)
RDW: 14 % (ref 12.3–15.4)
WBC: 6.5 10*3/uL (ref 3.4–10.8)

## 2014-11-05 LAB — VITAMIN D 25 HYDROXY (VIT D DEFICIENCY, FRACTURES): Vit D, 25-Hydroxy: 30.8 ng/mL (ref 30.0–100.0)

## 2014-11-18 ENCOUNTER — Other Ambulatory Visit: Payer: Self-pay | Admitting: Cardiovascular Disease

## 2014-11-18 NOTE — Telephone Encounter (Signed)
Rx(s) sent to pharmacy electronically.  

## 2014-11-19 ENCOUNTER — Other Ambulatory Visit: Payer: Medicare Other

## 2014-11-19 DIAGNOSIS — Z1212 Encounter for screening for malignant neoplasm of rectum: Secondary | ICD-10-CM

## 2014-11-20 DIAGNOSIS — Z1212 Encounter for screening for malignant neoplasm of rectum: Secondary | ICD-10-CM | POA: Diagnosis not present

## 2014-11-20 NOTE — Progress Notes (Signed)
Lab only 

## 2014-11-22 LAB — FECAL OCCULT BLOOD, IMMUNOCHEMICAL: Fecal Occult Bld: POSITIVE — AB

## 2014-11-26 ENCOUNTER — Telehealth: Payer: Self-pay | Admitting: Family Medicine

## 2014-11-26 DIAGNOSIS — R195 Other fecal abnormalities: Secondary | ICD-10-CM

## 2014-11-26 NOTE — Telephone Encounter (Signed)
Stp and pt aware she needs to repeat stool card and CBC in 2 weeks, orders put in epic.

## 2014-12-04 ENCOUNTER — Ambulatory Visit (INDEPENDENT_AMBULATORY_CARE_PROVIDER_SITE_OTHER): Payer: Medicare Other | Admitting: Pharmacist

## 2014-12-04 DIAGNOSIS — Z7901 Long term (current) use of anticoagulants: Secondary | ICD-10-CM | POA: Diagnosis not present

## 2014-12-04 DIAGNOSIS — E1122 Type 2 diabetes mellitus with diabetic chronic kidney disease: Secondary | ICD-10-CM

## 2014-12-04 DIAGNOSIS — I480 Paroxysmal atrial fibrillation: Secondary | ICD-10-CM

## 2014-12-04 DIAGNOSIS — N183 Chronic kidney disease, stage 3 unspecified: Secondary | ICD-10-CM

## 2014-12-04 DIAGNOSIS — R195 Other fecal abnormalities: Secondary | ICD-10-CM

## 2014-12-04 LAB — POCT CBC
Granulocyte percent: 67.1 %G (ref 37–80)
HCT, POC: 34.2 % — AB (ref 37.7–47.9)
Hemoglobin: 11 g/dL — AB (ref 12.2–16.2)
LYMPH, POC: 1.6 (ref 0.6–3.4)
MCH, POC: 30.3 pg (ref 27–31.2)
MCHC: 32.1 g/dL (ref 31.8–35.4)
MCV: 94.6 fL (ref 80–97)
MPV: 7.6 fL (ref 0–99.8)
POC Granulocyte: 4.5 (ref 2–6.9)
POC LYMPH %: 23.9 % (ref 10–50)
Platelet Count, POC: 234 10*3/uL (ref 142–424)
RBC: 3.62 M/uL — AB (ref 4.04–5.48)
RDW, POC: 14.3 %
WBC: 6.7 10*3/uL (ref 4.6–10.2)

## 2014-12-04 LAB — POCT INR: INR: 3.1

## 2014-12-04 NOTE — Patient Instructions (Signed)
Anticoagulation Dose Instructions as of 12/04/2014      [name] Tue Wed Thu Fri Sat   New Dose 5 mg 5 mg 5 mg 5 mg 5 mg 5 mg 0 mg    Description        Do not take warfarin today - Thursday, June 9th.  Then restart regular dose of warfarin 5mg  tablets take 1 tablet daily except none on saturdays     INR was 3.1 today

## 2014-12-13 ENCOUNTER — Other Ambulatory Visit: Payer: Medicare Other

## 2014-12-13 DIAGNOSIS — E1122 Type 2 diabetes mellitus with diabetic chronic kidney disease: Secondary | ICD-10-CM

## 2014-12-13 DIAGNOSIS — N183 Chronic kidney disease, stage 3 unspecified: Secondary | ICD-10-CM

## 2014-12-13 DIAGNOSIS — R195 Other fecal abnormalities: Secondary | ICD-10-CM

## 2014-12-15 ENCOUNTER — Other Ambulatory Visit: Payer: Self-pay | Admitting: Family Medicine

## 2014-12-15 DIAGNOSIS — N183 Chronic kidney disease, stage 3 (moderate): Secondary | ICD-10-CM | POA: Diagnosis not present

## 2014-12-15 DIAGNOSIS — E1122 Type 2 diabetes mellitus with diabetic chronic kidney disease: Secondary | ICD-10-CM | POA: Diagnosis not present

## 2014-12-15 DIAGNOSIS — R195 Other fecal abnormalities: Secondary | ICD-10-CM | POA: Diagnosis not present

## 2014-12-16 ENCOUNTER — Other Ambulatory Visit: Payer: Self-pay | Admitting: Family Medicine

## 2014-12-16 LAB — MICROALBUMIN / CREATININE URINE RATIO
CREATININE, UR: 30.2 mg/dL
MICROALB/CREAT RATIO: 105.6 mg/g{creat} — AB (ref 0.0–30.0)
MICROALBUM., U, RANDOM: 31.9 ug/mL

## 2014-12-17 LAB — FECAL OCCULT BLOOD, IMMUNOCHEMICAL: FECAL OCCULT BLD: NEGATIVE

## 2014-12-19 ENCOUNTER — Telehealth: Payer: Self-pay | Admitting: Family Medicine

## 2014-12-19 NOTE — Telephone Encounter (Signed)
Patient aware FOBT neg. Waiting on microalbumin result. This was not documented on. Sent back to Dr Laurance Flatten for review. Will call patient after he comments on it.  Patient stated understanding.

## 2014-12-22 ENCOUNTER — Other Ambulatory Visit: Payer: Self-pay | Admitting: Cardiovascular Disease

## 2014-12-22 ENCOUNTER — Telehealth: Payer: Self-pay | Admitting: Pharmacist

## 2014-12-22 NOTE — Telephone Encounter (Signed)
Rx(s) sent to pharmacy electronically.  

## 2014-12-22 NOTE — Telephone Encounter (Signed)
Patient's urine microalbumin was elevated at last check.  Type 2 DM has been in fairly good control (last A1c was 7.3%).  SBP was slightly elevated and DBP and HR low at last check.   Considering adding low dose ACE or ARB but last GFR was 28.  Will discuss results of lab with patient at appt 01/05/15 and consider our options. Left message for patient's daughter in law.

## 2014-12-30 ENCOUNTER — Other Ambulatory Visit: Payer: Self-pay | Admitting: Cardiovascular Disease

## 2014-12-30 NOTE — Telephone Encounter (Signed)
Rx(s) sent to pharmacy electronically.  

## 2015-01-05 ENCOUNTER — Ambulatory Visit: Payer: Self-pay

## 2015-01-05 ENCOUNTER — Ambulatory Visit (INDEPENDENT_AMBULATORY_CARE_PROVIDER_SITE_OTHER): Payer: Medicare Other | Admitting: Pharmacist

## 2015-01-05 ENCOUNTER — Encounter: Payer: Self-pay | Admitting: Pharmacist

## 2015-01-05 VITALS — BP 136/70 | HR 61 | Ht 61.0 in | Wt 145.5 lb

## 2015-01-05 DIAGNOSIS — M8580 Other specified disorders of bone density and structure, unspecified site: Secondary | ICD-10-CM | POA: Insufficient documentation

## 2015-01-05 DIAGNOSIS — Z0000 Encounter for general adult medical examination without abnormal findings: Secondary | ICD-10-CM

## 2015-01-05 DIAGNOSIS — E1165 Type 2 diabetes mellitus with hyperglycemia: Secondary | ICD-10-CM

## 2015-01-05 DIAGNOSIS — I480 Paroxysmal atrial fibrillation: Secondary | ICD-10-CM

## 2015-01-05 DIAGNOSIS — Z7901 Long term (current) use of anticoagulants: Secondary | ICD-10-CM

## 2015-01-05 DIAGNOSIS — Z794 Long term (current) use of insulin: Principal | ICD-10-CM

## 2015-01-05 DIAGNOSIS — IMO0002 Reserved for concepts with insufficient information to code with codable children: Secondary | ICD-10-CM

## 2015-01-05 NOTE — Patient Instructions (Addendum)
[name] , Thank you for taking time to come for your Medicare Wellness Visit. I appreciate your ongoing commitment to your health goals. Please review the following plan we discussed and let me know if I can assist you in the future.    This is a list of the screening recommended for you and due dates:  Health Maintenance  Topic Date Due  . Pneumonia vaccines (2 of 2 - PPSV23) Completed  . Eye exam for diabetics  Will check with Dr Zigmund Daniel office  . Shingles Vaccine  Delayed due to cost  . Tetanus Vaccine  Delayed due to cost  . DEXA scan (bone density measurement)  Now   . Colon Cancer Screening  06/12/2018*  . Flu Shot  01/26/2015  . Hemoglobin A1C  05/07/2015  . Complete foot exam   11/04/2015  . Urine Protein Check  12/15/2015  *Topic was postponed. The date shown is not the original due date.    Anticoagulation Dose Instructions as of 01/05/2015      Dorene Grebe Tue Wed Thu Fri Sat   New Dose 5 mg 5 mg 5 mg 5 mg 5 mg 5 mg 0 mg    Description        Take 2 warfarin tablets today and tomorrow.  Then restart regular dose of warfarin 71m tablets take 1 tablet daily except none on saturdays     INR was 1.2 today    Diabetes and Standards of Medical Care   Diabetes is complicated. You may find that your diabetes team includes a dietitian, nurse, diabetes educator, eye doctor, and more. To help everyone know what is going on and to help you get the care you deserve, the following schedule of care was developed to help keep you on track. Below are the tests, exams, vaccines, medicines, education, and plans you will need.  Blood Glucose Goals Prior to meals = 80 - 130 Within 2 hours of the start of a meal = less than 180  HbA1c test (goal is less than 7.0% - your last value was 7.3%) This test shows how well you have controlled your glucose over the past 2 to 3 months. It is used to see if your diabetes management plan needs to be adjusted.   It is performed at least 2 times a  year if you are meeting treatment goals.  It is performed 4 times a year if therapy has changed or if you are not meeting treatment goals.  Blood pressure test  This test is performed at every routine medical visit. The goal is less than 140/90 mmHg for most people, but 130/80 mmHg in some cases. Ask your health care provider about your goal.  Dental exam  Follow up with the dentist regularly.  Eye exam  If you are diagnosed with type 1 diabetes as a child, get an exam upon reaching the age of 128years or older and have had diabetes for 3 to 5 years. Yearly eye exams are recommended after that initial eye exam.  If you are diagnosed with type 1 diabetes as an adult, get an exam within 5 years of diagnosis and then yearly.  If you are diagnosed with type 2 diabetes, get an exam as soon as possible after the diagnosis and then yearly.  Foot care exam  Visual foot exams are performed at every routine medical visit. The exams check for cuts, injuries, or other problems with the feet.  A comprehensive foot exam should be  done yearly. This includes visual inspection as well as assessing foot pulses and testing for loss of sensation.  Check your feet nightly for cuts, injuries, or other problems with your feet. Tell your health care provider if anything is not healing.  Kidney function test (urine microalbumin)  This test is performed once a year.  Type 1 diabetes: The first test is performed 5 years after diagnosis.  Type 2 diabetes: The first test is performed at the time of diagnosis.  A serum creatinine and estimated glomerular filtration rate (eGFR) test is done once a year to assess the level of chronic kidney disease (CKD), if present.  Lipid profile (cholesterol, HDL, LDL, triglycerides)  Performed every 5 years for most people.  The goal for LDL is less than 100 mg/dL. If you are at high risk, the goal is less than 70 mg/dL.  The goal for HDL is 40 mg/dL to 50 mg/dL for  men and 50 mg/dL to 60 mg/dL for women. An HDL cholesterol of 60 mg/dL or higher gives some protection against heart disease.  The goal for triglycerides is less than 150 mg/dL.  Influenza vaccine, pneumococcal vaccine, and hepatitis B vaccine  The influenza vaccine is recommended yearly.  The pneumococcal vaccine is generally given once in a lifetime. However, there are some instances when another vaccination is recommended. Check with your health care provider.  The hepatitis B vaccine is also recommended for adults with diabetes.  Diabetes self-management education  Education is recommended at diagnosis and ongoing as needed.  Treatment plan  Your treatment plan is reviewed at every medical visit.     Fall Prevention and Home Safety Falls cause injuries and can affect all age groups. It is possible to use preventive measures to significantly decrease the likelihood of falls. There are many simple measures which can make your home safer and prevent falls. OUTDOORS  Repair cracks and edges of walkways and driveways.  Remove high doorway thresholds.  Trim shrubbery on the main path into your home.  Have good outside lighting.  Clear walkways of tools, rocks, debris, and clutter.  Check that handrails are not broken and are securely fastened. Both sides of steps should have handrails.  Have leaves, snow, and ice cleared regularly.  Use sand or salt on walkways during winter months.  In the garage, clean up grease or oil spills. BATHROOM  Install night lights.  Install grab bars by the toilet and in the tub and shower.  Use non-skid mats or decals in the tub or shower.  Place a plastic non-slip stool in the shower to sit on, if needed.  Keep floors dry and clean up all water on the floor immediately.  Remove soap buildup in the tub or shower on a regular basis.  Secure bath mats with non-slip, double-sided rug tape.  Remove throw rugs and tripping hazards  from the floors. BEDROOMS  Install night lights.  Make sure a bedside light is easy to reach.  Do not use oversized bedding.  Keep a telephone by your bedside.  Have a firm chair with side arms to use for getting dressed.  Remove throw rugs and tripping hazards from the floor. KITCHEN  Keep handles on pots and pans turned toward the center of the stove. Use back burners when possible.  Clean up spills quickly and allow time for drying.  Avoid walking on wet floors.  Avoid hot utensils and knives.  Position shelves so they are not too high or low.  Place commonly used objects within easy reach.  If necessary, use a sturdy step stool with a grab bar when reaching.  Keep electrical cables out of the way.  Do not use floor polish or wax that makes floors slippery. If you must use wax, use non-skid floor wax.  Remove throw rugs and tripping hazards from the floor. STAIRWAYS  Never leave objects on stairs.  Place handrails on both sides of stairways and use them. Fix any loose handrails. Make sure handrails on both sides of the stairways are as long as the stairs.  Check carpeting to make sure it is firmly attached along stairs. Make repairs to worn or loose carpet promptly.  Avoid placing throw rugs at the top or bottom of stairways, or properly secure the rug with carpet tape to prevent slippage. Get rid of throw rugs, if possible.  Have an electrician put in a light switch at the top and bottom of the stairs. OTHER FALL PREVENTION TIPS  Wear low-heel or rubber-soled shoes that are supportive and fit well. Wear closed toe shoes.  When using a stepladder, make sure it is fully opened and both spreaders are firmly locked. Do not climb a closed stepladder.  Add color or contrast paint or tape to grab bars and handrails in your home. Place contrasting color strips on first and last steps.  Learn and use mobility aids as needed. Install an electrical emergency response  system.  Turn on lights to avoid dark areas. Replace light bulbs that burn out immediately. Get light switches that glow.  Arrange furniture to create clear pathways. Keep furniture in the same place.  Firmly attach carpet with non-skid or double-sided tape.  Eliminate uneven floor surfaces.  Select a carpet pattern that does not visually hide the edge of steps.  Be aware of all pets. OTHER HOME SAFETY TIPS  Set the water temperature for 120 F (48.8 C).  Keep emergency numbers on or near the telephone.  Keep smoke detectors on every level of the home and near sleeping areas. Document Released: 06/03/2002 Document Revised: 12/13/2011 Document Reviewed: 09/02/2011 [facility] Patient Information 2015 [place], [place]. This information is not intended to replace advice given to you by your health care provider. Make sure you discuss any questions you have with your health care provider.

## 2015-01-05 NOTE — Progress Notes (Signed)
Patient ID: Terin Dierolf, female   DOB: 04/21/1928, 81 y.o.   MRN: [id number]    Subjective:   Kareemah Grounds is a 81 y.o. female who presents for a subsequent Medicare Annual Wellness Visit and to recheck PT/INR  Patient lives by herself in [place].  She is brought to office today by Terri her daughter in law who is her main caretaker.    Mrs. Tregre main health concern today is her sight.  She has experienced decreasing eye sight over the last year.  She reports that her last visit with Dr Zigmund Daniel was about 3 years ago and she was told that there was nothing to be done about her diminishing eye sight.      Indication for anticoagulation: atrial fibrillation Bleeding signs/symptoms: None Thromboembolic signs/symptoms: None Missed Coumadin doses: This week - missed 1 dose Medication changes: no Dietary changes: no Bacterial/viral infection: no  Current Medications (verified) Outpatient Encounter Prescriptions as of 01/05/2015  Medication Sig  . ALPRAZolam (XANAX) 0.25 MG tablet TAKE 1/2 TO 1 TABLET 2 TIMES A DAY AS NEEDED FOR ANXIETY  . amiodarone (PACERONE) 200 MG tablet Take 0.5 tablets (100 mg total) by mouth daily. Please keep upcoming appointment  . aspirin 81 MG chewable tablet Chew 81 mg by mouth daily.  Marland Kitchen atorvastatin (LIPITOR) 40 MG tablet Take 1 tablet (40 mg total) by mouth every evening.  . Azelastine HCl 0.15 % SOLN Place 1 spray into both nostrils 2 (two) times daily as needed (congestion).  . budesonide-formoterol (SYMBICORT) 160-4.5 MCG/ACT inhaler Inhale 1 puff into the lungs 2 (two) times daily.  . Cholecalciferol (VITAMIN D PO) Take 1 capsule by mouth daily.   . fluticasone (FLONASE) 50 MCG/ACT nasal spray Place 1 spray into both nostrils 2 (two) times daily.  . furosemide (LASIX) 40 MG tablet Take 1 tablet (40 mg total) by mouth daily. May take extra dose if weight increases 3.5 pounds in 24 hrs as needed for fluid and edema.  Marland Kitchen glucosamine-chondroitin 500-400 MG  tablet Take 1 tablet by mouth 2 (two) times daily.  Marland Kitchen glucose blood (RELION ULTIMA TEST) test strip USE TO CHECK GLUCOSE UP TO 4 TIMES DAILY  . guaiFENesin (MUCINEX) 600 MG 12 hr tablet Take 2 tablets (1,200 mg total) by mouth 2 (two) times daily as needed for cough or to loosen phlegm.  . insulin aspart (NOVOLOG) 100 UNIT/ML injection Inject 0 to 8 units up to 3 times a day prior to meal per sliding scale  . insulin detemir (LEVEMIR) 100 UNIT/ML injection Inject 0.16 mLs (16 Units total) into the skin every morning.  . insulin lispro (HUMALOG KWIKPEN) 100 UNIT/ML KiwkPen Inject 0-8 Units into the skin 3 (three) times daily before meals. Per sliding scale  . Ipratropium-Albuterol (COMBIVENT) 20-100 MCG/ACT AERS respimat Inhale 1 puff into the lungs every 6 (six) hours as needed for wheezing or shortness of breath.  . isosorbide mononitrate (IMDUR) 60 MG 24 hr tablet TAKE 1 TABLET DAILY  . metoprolol succinate (TOPROL-XL) 25 MG 24 hr tablet Take 12.5 mg by mouth daily.  . metoprolol succinate (TOPROL-XL) 25 MG 24 hr tablet TAKE 1/2 TABLET DAILY  . Multiple Vitamins-Minerals (PRESERVISION/LUTEIN) CAPS Take 1 capsule by mouth daily.   Marland Kitchen NIFEdipine (NIFEDICAL XL) 30 MG 24 hr tablet Take 1 tablet (30 mg total) by mouth daily. PATIENT NEEDS TO CONTACT OFFICE FOR ADDITIONAL REFILLS  . nitroGLYCERIN (NITROSTAT) 0.4 MG SL tablet Place 1 tablet (0.4 mg total) under the tongue every 5 (five)  minutes as needed for chest pain.  . pantoprazole (PROTONIX) 40 MG tablet TAKE 1 TABLET TWICE A DAY  . potassium chloride SA (K-DUR,KLOR-CON) 20 MEQ tablet Take 1 tablet (20 mEq total) by mouth daily.  Marland Kitchen senna-docusate (SENOKOT-S) 8.6-50 MG per tablet Take 1 tablet by mouth 2 (two) times daily.  . simethicone (MYLICON) 80 MG chewable tablet Chew 80 mg by mouth at bedtime as needed for flatulence.  . vitamin C (ASCORBIC ACID) 500 MG tablet Take 500 mg by mouth at bedtime.   Marland Kitchen warfarin (COUMADIN) 5 MG tablet TAKE (1) TABLET  DAILY AFTER SUPPER.   No facility-administered encounter medications on file as of 01/05/2015.    Allergies (verified) Atorvastatin; Codeine; Morphine; Other; Rosuvastatin; and Iohexol   History: Past Medical History  Diagnosis Date  . Hyperlipidemia   . Hypertension   . Anxiety   . GERD (gastroesophageal reflux disease)   . CAD (coronary artery disease)     Cath in 2006 showed an occluded RCA with left-to-right collaterals & otherwise noncritical CAD with EF=25-30% at that time. EF subsequently improved to normal by 2D ECHO. Cath Aug. 2011 showed unchanged anatomy. > medical therapy recommended  . Diastolic dysfunction     Grade II  . RBBB (right bundle branch block)     Chronic  . Paroxysmal atrial fibrillation   . Hypoxemic respiratory failure, chronic     uses 2.5 liters of oxygen with sleep  . COPD (chronic obstructive pulmonary disease)     GOLD 4 - PFT 06/08/10 FEV1 0.93 (62%), FEV1% 60, TLC 2.84 (69%0, DLCO 54%, +BD) On home O2.  . Secondary pulmonary hypertension   . Edema   . Angina   . Shortness of breath   . CHF (congestive heart failure)   . Diabetes mellitus     Type II - goal A1C is 8, to avoid hypoglycemia  . Bradycardia 08/24/2011  . Carotid artery disease     Moderate, right greater than left which we following by duplex ultrasound. [place] 12/05/11 = RIght Bulb/Prox ICA: Moderate to severe amt of fibrous plaque elevating velocities w/in prox segment of ICA. Consistent w/a 50-69% diameter reduction. Left Bulb/Prox ICA: Moderate amt of fibrous plaque slightly elevating velocities w/in the prox segment of the ICA. Consistent w/a 0-49% diameter reduction.   Marland Kitchen PAF (paroxysmal atrial fibrillation)     In the past, on coumadin  . CHF (congestive heart failure)     Hospitalized 08/22/11-08/26/11 with CHF secondary to diastolic dysfunction & hospitilized in 07/2012 with a similar problem. TTE 08/23/11 = normal EF.  Marland Kitchen History of stress test 07/02/09    Mild perfusion defect seen in  the Basal Inferior region(s). This is consistent with an infarct/scar.  Post-stress EF=56%. Global LV systolic function is normal.  EKG is negative for ischemia.  No significant iscemia detected. Low risk scan.  . Lower extremity edema     ECHO 07/21/12 = EF 55-60%, performed for TIA. Responding to diurectics. Continue diuresis w/ a goal dry weight of <160lb. Venous Duplex 07/27/11 = Right lower extremity: no evidence of thrombus or thrombophlebitis. Essentially normal right lower extremity venous duplex Doppler evaluation.  . Carotid bruit   . Mitral valve regurgitation     Mild to moderate by ECHO 07/21/12  . Sleep-related hypoventilation   . COPD with emphysema   . Coronary atherosclerosis of unspecified type of vessel, native or graft   . Cataract    Past Surgical History  Procedure Laterality Date  .  Rotator cuff repair  07/2005    right  . Cardiac catheterization  2006 & 2011    Cath in 2006 showed an occluded RCA with left-to-right collaterals & otherwise noncritical CAD with EF=25-30% at that time. EF subsequently improved to normal by 2D ECHO. Cath Aug. 2011 showed unchanged anatomy. > medical therapy recommended.  . Coronary angioplasty    . Coronary angioplasty with stent placement      x2  . Carotid duplex  12/05/11    Moderate, right greater than left which we following by duplex ultrasound. [place] 12/05/11 = RIght Bulb/Prox ICA: Moderate to severe amt of fibrous plaque elevating velocities w/in prox segment of ICA. Consistent w/a 50-69% diameter reduction. Left Bulb/Prox ICA: Moderate amt of fibrous plaque slightly elevating velocities w/in the prox segment of the ICA. Consistent w/a 0-49% diameter reduction.   . Venous duplex  07/27/11    Venous Duplex 07/27/11 = Right lower extremity: no evidence of thrombus or thrombophlebitis. Essentially normal right lower extremity venous duplex Doppler evaluation.  . Abdominal hysterectomy      complete  . Transvaginal tape (tvt) removal    . Eye  surgery     Family History  Problem Relation Age of Onset  . Cancer Mother     Skin  . Heart disease Mother   . Emphysema Father   . Heart disease Father   . Congestive Heart Failure Father   . Heart disease Brother   . Heart disease Brother   . Early death Sister    Social History   Occupational History  . Not on file.   Social History Main Topics  . Smoking status: Former Smoker -- 1.50 packs/day for 15 years    Types: Cigarettes    Quit date: 05/27/1986  . Smokeless tobacco: Never Used     Comment: Smoked 1.5 packs per day for 15 years, quit in 1995.  Marland Kitchen Alcohol Use: Yes     Comment: Occasional, 1-2 glasses of wine daily  . Drug Use: No  . Sexual Activity: No    Do you feel safe at home?  Yes  Dietary issues and exercise activities: Current Exercise Habits:: Exercise is limited by, Limited by:: orthopedic condition(s)  Current Dietary habits:  Tried to limit bread and sweets   Objective:    Today's Vitals   01/05/15 1214  BP: 136/70  Pulse: 61  Height: 5\' 1"  (1.549 m)  Weight: 145 lb 8 oz (65.998 kg)  PainSc: 7    Body mass index is 27.51 kg/(m^2).   INR was 1.2 today  Activities of Daily Living In your present state of health, do you have any difficulty performing the following activities: 01/05/2015  Hearing? Y  Vision? Y  Difficulty concentrating or making decisions? N  Walking or climbing stairs? Y  Dressing or bathing? N  Doing errands, shopping? Y  Preparing Food and eating ? N  Using the Toilet? N  In the past six months, have you accidently leaked urine? N  Do you have problems with loss of bowel control? N  Managing your Medications? Y  Managing your Finances? N  Housekeeping or managing your Housekeeping? N    Are there smokers in your home (other than you)? No   Cardiac Risk Factors include: advanced age (>58men, >69 women);diabetes mellitus;dyslipidemia;hypertension;obesity (BMI >30kg/m2)  Depression Screen PHQ 2/9 Scores  01/05/2015 11/04/2014 06/12/2014 01/28/2014  PHQ - 2 Score 1 0 0 2  PHQ- 9 Score - - - 2  Fall Risk Fall Risk  01/05/2015 11/04/2014 06/12/2014 01/28/2014 01/03/2014  Falls in the past year? No No No No No  Risk for fall due to : - - - - Impaired balance/gait;Impaired mobility    Cognitive Function: MMSE - Mini Mental State Exam 01/05/2015  Orientation to time 5  Orientation to Place 5  Registration 3  Attention/ Calculation 5  Recall 3  Language- name 2 objects 2  Language- repeat 1  Language- follow 3 step command 3  Language- read & follow direction 1  Write a sentence 1  Copy design 0  Total score 29    Immunizations and Health Maintenance Immunization History  Administered Date(s) Administered  . Influenza Split 04/01/2011, 03/12/2012  . Influenza Whole 03/31/2010  . Influenza,inj,Quad PF,36+ Mos 04/01/2013, 04/03/2014  . Pneumococcal Conjugate-13 01/03/2014   Health Maintenance Due  Topic Date Due  . PNA vac Low Risk Adult (2 of 2 - PPSV23) 01/04/2015    Patient Care Team: Chipper Herb, MD as PCP - General (Family Medicine) Lorretta Harp, MD as PCP - Cardiology (Cardiology) Hayden Pedro, MD as Consulting Physician (Ophthalmology)  Indicate any recent Medical Services you may have received from other than [facility] providers in the past year (date may be approximate).    Assessment:    Annual Wellness Visit  Subtherapeutic anticogaulation Type 2 DM - in need of eye exam   Screening Tests Health Maintenance  Topic Date Due  . PNA vac Low Risk Adult (2 of 2 - PPSV23) 01/04/2015  . OPHTHALMOLOGY EXAM  05/07/2015 (Originally 02/28/1938)  . ZOSTAVAX  05/07/2015 (Originally 02/27/1990)  . TETANUS/TDAP  05/07/2015 (Originally 02/27/1949)  . DEXA SCAN  11/04/2015 (Originally 02/28/1995)  . COLONOSCOPY  06/12/2018 (Originally 02/28/1980)  . INFLUENZA VACCINE  01/26/2015  . HEMOGLOBIN A1C  05/07/2015  . FOOT EXAM  11/04/2015  . URINE MICROALBUMIN  12/15/2015          Plan:   During the course of the visit Ellamarie was educated and counseled about the following appropriate screening and preventive services:   Vaccines to include Pneumoccal, Influenza, Hepatitis B, Td, Zostavax - due Zostavax and Tdap - patient declined due to cost  Colorectal cancer screening - FOBT UTD  Cardiovascular disease screening - due EKG at next visit.  LDL and BP at goals currently  Urine microalbumin - since serum creatinine is elevated will not start ACE or ARB at this time.  Patient encouraged to keep BG and BP controlled  Bone Denisty / Osteoporosis Screening - patient refused  Glaucoma screening / Diabetic Eye Exam - referral made for Dr Zigmund Daniel at patient request for follow up  Advanced Directives - information given to patient  Patient requested letter stating that she is leagally blind so that her phone service provider will not charge for directory assistance she she cannot see well enough to read phone book.  Request sent to Yisroel Ramming to type letter and get her PCP to sign.  Referral to [facility] for removal of skin lesion.  Anticoagulation Dose Instructions as of 01/05/2015      Dorene Grebe Tue Wed Thu Fri Sat   New Dose 5 mg 5 mg 5 mg 5 mg 5 mg 5 mg 0 mg    Description        Take 2 warfarin tablets today - Monday, July 11th and tomorrow - Tuesday, July 12th.  Then restart regular dose of warfarin 5mg  tablets take 1 tablet daily except none  on saturdays      Patient Instructions (the written plan) were given to the patient.   Cherre Robins, [facility]   01/05/2015

## 2015-01-06 ENCOUNTER — Encounter: Payer: Self-pay | Admitting: Family Medicine

## 2015-01-08 ENCOUNTER — Ambulatory Visit (INDEPENDENT_AMBULATORY_CARE_PROVIDER_SITE_OTHER): Payer: Medicare Other | Admitting: Family Medicine

## 2015-01-08 ENCOUNTER — Encounter: Payer: Self-pay | Admitting: Family Medicine

## 2015-01-08 VITALS — BP 183/73 | HR 70 | Temp 97.1°F

## 2015-01-08 DIAGNOSIS — R52 Pain, unspecified: Secondary | ICD-10-CM | POA: Diagnosis not present

## 2015-01-08 DIAGNOSIS — R51 Headache: Secondary | ICD-10-CM | POA: Diagnosis not present

## 2015-01-08 DIAGNOSIS — I10 Essential (primary) hypertension: Secondary | ICD-10-CM | POA: Diagnosis not present

## 2015-01-08 DIAGNOSIS — G4489 Other headache syndrome: Secondary | ICD-10-CM | POA: Diagnosis not present

## 2015-01-08 DIAGNOSIS — I159 Secondary hypertension, unspecified: Secondary | ICD-10-CM | POA: Diagnosis not present

## 2015-01-08 DIAGNOSIS — R519 Headache, unspecified: Secondary | ICD-10-CM

## 2015-01-08 DIAGNOSIS — N390 Urinary tract infection, site not specified: Secondary | ICD-10-CM | POA: Diagnosis not present

## 2015-01-08 NOTE — Progress Notes (Signed)
Subjective:  Patient ID: [name], female    DOB: 04/17/1928  Age: 81 y.o. MRN: [id number]  CC: Headache   HPI [name] presents for sudden onset of severe pain at occiput at 6 AM. 4-5/10 at the time was dul. Started moving forward bilaterally into parietal scalp with throbbing and increasing intensity to 8/10. No focal weakness or numbness in the ext. Took several tylenol plus 2 nitroglycerin without relief. Pt. Walked in to clinic at time of 3 PM (wheel chair) immediately seen. Denies chest pain. No vision changes. No nausea or abd pain. Daughter states pt HOH, but pt answers appropriately to conversational voice.  History [name] has a past medical history of Hyperlipidemia; Hypertension; Anxiety; GERD (gastroesophageal reflux disease); CAD (coronary artery disease); Diastolic dysfunction; RBBB (right bundle branch block); Hypoxemic respiratory failure, chronic; COPD (chronic obstructive pulmonary disease); Secondary pulmonary hypertension; Edema; Angina; Shortness of breath; Bradycardia (08/24/2011); Carotid artery disease; PAF (paroxysmal atrial fibrillation); History of stress test (07/02/09); Lower extremity edema; Carotid bruit; Mitral valve regurgitation; Sleep-related hypoventilation; COPD with emphysema; Coronary atherosclerosis of unspecified type of vessel, native or graft; PONV (postoperative nausea and vomiting); Family history of adverse reaction to anesthesia; CHF (congestive heart failure); Myocardial infarction; On home oxygen therapy; Pneumonia (~ 2013); Type II diabetes mellitus; Sleep apnea; Stroke; Arthritis; Chronic lower back pain; Bleeds easily; and Fall during current hospitalization.   She has past surgical history that includes Shoulder open rotator cuff repair (Right, 07/2005); Cardiac catheterization (2006 & 2011); Coronary angioplasty; Coronary angioplasty with stent; Carotid Duplex (12/05/11); Venous Duplex (07/27/11); Transvaginal tape (tvt) removal; Cataract  extraction w/ intraocular lens  implant, bilateral (Bilateral); Tonsillectomy; Total abdominal hysterectomy (~ 1967); and Femur IM nail (Left, 01/15/2015).   Her family history includes Cancer in her mother; Congestive Heart Failure in her father; Early death in her sister; Emphysema in her father; Heart disease in her brother, brother, father, and mother.She reports that she has quit smoking. Her smoking use included Cigarettes. She has a 22.5 pack-year smoking history. She has never used smokeless tobacco. She reports that she drinks about 3.6 oz of alcohol per week. She reports that she does not use illicit drugs.  No facility-administered medications prior to visit.   Outpatient Prescriptions Prior to Visit  Medication Sig Dispense Refill  . ALPRAZolam (XANAX) 0.25 MG tablet TAKE 1/2 TO 1 TABLET 2 TIMES A DAY AS NEEDED FOR ANXIETY 30 tablet 2  . amiodarone (PACERONE) 200 MG tablet Take 0.5 tablets (100 mg total) by mouth daily. Please keep upcoming appointment 30 tablet 0  . aspirin 81 MG chewable tablet Chew 81 mg by mouth daily.    Marland Kitchen atorvastatin (LIPITOR) 40 MG tablet Take 1 tablet (40 mg total) by mouth every evening. 30 tablet 8  . Azelastine HCl 0.15 % SOLN Place 1 spray into both nostrils 2 (two) times daily as needed (congestion).    . budesonide-formoterol (SYMBICORT) 160-4.5 MCG/ACT inhaler Inhale 1 puff into the lungs 2 (two) times daily. 3 Inhaler 2  . Cholecalciferol (VITAMIN D PO) Take 1 capsule by mouth daily.     . fluticasone (FLONASE) 50 MCG/ACT nasal spray Place 1 spray into both nostrils 2 (two) times daily. 16 g 6  . furosemide (LASIX) 40 MG tablet Take 1 tablet (40 mg total) by mouth daily. May take extra dose if weight increases 3.5 pounds in 24 hrs as needed for fluid and edema. 60 tablet 4  . glucosamine-chondroitin 500-400 MG tablet Take 1 tablet by mouth  2 (two) times daily.    Marland Kitchen glucose blood (RELION ULTIMA TEST) test strip USE TO CHECK GLUCOSE UP TO 4 TIMES DAILY 200  each 1  . insulin aspart (NOVOLOG) 100 UNIT/ML injection Inject 0 to 8 units up to 3 times a day prior to meal per sliding scale 20 mL 2  . insulin detemir (LEVEMIR) 100 UNIT/ML injection Inject 0.16 mLs (16 Units total) into the skin every morning. 20 mL 2  . Ipratropium-Albuterol (COMBIVENT) 20-100 MCG/ACT AERS respimat Inhale 1 puff into the lungs every 6 (six) hours as needed for wheezing or shortness of breath. 3 Inhaler 2  . isosorbide mononitrate (IMDUR) 60 MG 24 hr tablet TAKE 1 TABLET DAILY 30 tablet 4  . metoprolol succinate (TOPROL-XL) 25 MG 24 hr tablet TAKE 1/2 TABLET DAILY 15 tablet 4  . Multiple Vitamins-Minerals (PRESERVISION/LUTEIN) CAPS Take 1 capsule by mouth daily.     Marland Kitchen NIFEdipine (NIFEDICAL XL) 30 MG 24 hr tablet Take 1 tablet (30 mg total) by mouth daily. PATIENT NEEDS TO CONTACT OFFICE FOR ADDITIONAL REFILLS 30 tablet 0  . nitroGLYCERIN (NITROSTAT) 0.4 MG SL tablet Place 1 tablet (0.4 mg total) under the tongue every 5 (five) minutes as needed for chest pain. 25 tablet 1  . pantoprazole (PROTONIX) 40 MG tablet TAKE 1 TABLET TWICE A DAY 60 tablet 4  . senna-docusate (SENOKOT-S) 8.6-50 MG per tablet Take 1 tablet by mouth 2 (two) times daily.    . simethicone (MYLICON) 80 MG chewable tablet Chew 80 mg by mouth at bedtime as needed for flatulence.    . vitamin C (ASCORBIC ACID) 500 MG tablet Take 500 mg by mouth at bedtime.     Marland Kitchen warfarin (COUMADIN) 5 MG tablet TAKE (1) TABLET DAILY AFTER SUPPER. (Patient taking differently: TAKE (1) TABLET DAILY AFTER SUPPER EXCEPT ON SATURDAY. NO MEDICATION ON SATURDAY) 30 tablet 2  . potassium chloride SA (K-DUR,KLOR-CON) 20 MEQ tablet Take 1 tablet (20 mEq total) by mouth daily. 30 tablet 9  . insulin lispro (HUMALOG KWIKPEN) 100 UNIT/ML KiwkPen Inject 0-8 Units into the skin 3 (three) times daily before meals. Per sliding scale    . guaiFENesin (MUCINEX) 600 MG 12 hr tablet Take 2 tablets (1,200 mg total) by mouth 2 (two) times daily as  needed for cough or to loosen phlegm. (Patient not taking: Reported on 01/08/2015)    . metoprolol succinate (TOPROL-XL) 25 MG 24 hr tablet Take 12.5 mg by mouth daily.      ROS Review of Systems  Constitutional: Negative for fever, chills, diaphoresis, appetite change, fatigue and unexpected weight change.  HENT: Negative for congestion, ear pain, hearing loss, postnasal drip, rhinorrhea, sneezing, sore throat and trouble swallowing.   Eyes: Negative for pain.  Respiratory: Negative for cough, chest tightness and shortness of breath.   Cardiovascular: Negative for chest pain and palpitations.  Gastrointestinal: Negative for nausea, vomiting, abdominal pain, diarrhea and constipation.  Genitourinary: Negative for dysuria, frequency and menstrual problem.  Musculoskeletal: Negative for joint swelling and arthralgias.  Skin: Negative for rash.  Neurological: Positive for numbness and headaches. Negative for dizziness, seizures, syncope and weakness.  Psychiatric/Behavioral: Negative for dysphoric mood and agitation.    Objective:  BP 183/73 mmHg  Pulse 70  Temp(Src) 97.1 F (36.2 C)  BP Readings from Last 3 Encounters:  01/17/15 112/56  01/08/15 183/73  01/05/15 136/70    Wt Readings from Last 3 Encounters:  01/15/15 147 lb 4.3 oz (66.8 kg)  01/05/15 145 lb 8  oz (65.998 kg)  11/04/14 144 lb (65.318 kg)     Physical Exam  Constitutional: She is oriented to person, place, and time. She appears well-developed and well-nourished. She appears distressed.  HENT:  Head: Normocephalic and atraumatic.  Right Ear: External ear normal.  Left Ear: External ear normal.  Nose: Nose normal.  Mouth/Throat: Oropharynx is clear and moist.  Eyes: Conjunctivae and EOM are normal. Pupils are equal, round, and reactive to light.  Neck: Normal range of motion. Neck supple. No thyromegaly present.  Cardiovascular: Normal rate, regular rhythm and normal heart sounds.   No murmur  heard. Pulmonary/Chest: Effort normal and breath sounds normal. No respiratory distress. She has no wheezes. She has no rales.  Abdominal: Soft. Bowel sounds are normal. She exhibits no distension. There is no tenderness.  Lymphadenopathy:    She has no cervical adenopathy.  Neurological: She is alert and oriented to person, place, and time. She has normal reflexes.  Skin: Skin is warm and dry.  Psychiatric: She has a normal mood and affect. Her behavior is normal. Judgment and thought content normal.    Lab Results  Component Value Date   HGBA1C 7.3 11/04/2014   HGBA1C 7..2% 06/12/2014   HGBA1C 7.0* 12/28/2013    Lab Results  Component Value Date   WBC 9.5 01/17/2015   HGB 8.8* 01/17/2015   HCT 27.8* 01/17/2015   PLT 211 01/17/2015   GLUCOSE 216* 01/17/2015   CHOL 163 11/04/2014   TRIG 74 11/04/2014   HDL 100 11/04/2014   LDLCALC 48 11/04/2014   ALT 6* 01/17/2015   AST 21 01/17/2015   NA 137 01/17/2015   K 3.8 01/17/2015   CL 97* 01/17/2015   CREATININE 1.31* 01/17/2015   BUN 27* 01/17/2015   CO2 31 01/17/2015   TSH 2.100 12/28/2013   INR 1.77* 01/17/2015   HGBA1C 7.3 11/04/2014    No results found.  Assessment & Plan:   [name] was seen today for headache.  Diagnoses and all orders for this visit:  Headache in back of head  I am having Ms. Herringshaw maintain her PRESERVISION/LUTEIN, vitamin C, senna-docusate, aspirin, glucosamine-chondroitin, simethicone, Cholecalciferol (VITAMIN D PO), fluticasone, insulin lispro, Azelastine HCl, budesonide-formoterol, insulin detemir, Ipratropium-Albuterol, insulin aspart, nitroGLYCERIN, ALPRAZolam, glucose blood, pantoprazole, furosemide, warfarin, atorvastatin, metoprolol succinate, isosorbide mononitrate, NIFEdipine, and amiodarone.  Due to the severity of the symptoms and multiple risk factors for stroke the patient was transferred via ambulance to [facility]   Follow-up: No Follow-up on file.  Claretta Fraise, M.D.

## 2015-01-09 DIAGNOSIS — N390 Urinary tract infection, site not specified: Secondary | ICD-10-CM | POA: Diagnosis not present

## 2015-01-10 ENCOUNTER — Other Ambulatory Visit: Payer: Self-pay | Admitting: Cardiovascular Disease

## 2015-01-10 DIAGNOSIS — R51 Headache: Secondary | ICD-10-CM | POA: Diagnosis not present

## 2015-01-10 DIAGNOSIS — M542 Cervicalgia: Secondary | ICD-10-CM | POA: Diagnosis not present

## 2015-01-10 DIAGNOSIS — I517 Cardiomegaly: Secondary | ICD-10-CM | POA: Diagnosis not present

## 2015-01-10 DIAGNOSIS — Z7982 Long term (current) use of aspirin: Secondary | ICD-10-CM | POA: Diagnosis not present

## 2015-01-10 DIAGNOSIS — N390 Urinary tract infection, site not specified: Secondary | ICD-10-CM | POA: Diagnosis not present

## 2015-01-10 DIAGNOSIS — Z885 Allergy status to narcotic agent status: Secondary | ICD-10-CM | POA: Diagnosis not present

## 2015-01-10 DIAGNOSIS — S72142A Displaced intertrochanteric fracture of left femur, initial encounter for closed fracture: Secondary | ICD-10-CM | POA: Diagnosis not present

## 2015-01-10 DIAGNOSIS — T481X5A Adverse effect of skeletal muscle relaxants [neuromuscular blocking agents], initial encounter: Secondary | ICD-10-CM | POA: Diagnosis not present

## 2015-01-10 DIAGNOSIS — Z79899 Other long term (current) drug therapy: Secondary | ICD-10-CM | POA: Diagnosis not present

## 2015-01-10 DIAGNOSIS — I480 Paroxysmal atrial fibrillation: Secondary | ICD-10-CM | POA: Diagnosis not present

## 2015-01-10 DIAGNOSIS — I2510 Atherosclerotic heart disease of native coronary artery without angina pectoris: Secondary | ICD-10-CM | POA: Diagnosis not present

## 2015-01-10 DIAGNOSIS — B0229 Other postherpetic nervous system involvement: Secondary | ICD-10-CM | POA: Diagnosis not present

## 2015-01-10 DIAGNOSIS — I10 Essential (primary) hypertension: Secondary | ICD-10-CM | POA: Diagnosis not present

## 2015-01-10 DIAGNOSIS — J189 Pneumonia, unspecified organism: Secondary | ICD-10-CM | POA: Diagnosis not present

## 2015-01-10 DIAGNOSIS — Z886 Allergy status to analgesic agent status: Secondary | ICD-10-CM | POA: Diagnosis not present

## 2015-01-10 DIAGNOSIS — Z7901 Long term (current) use of anticoagulants: Secondary | ICD-10-CM | POA: Diagnosis not present

## 2015-01-10 DIAGNOSIS — R410 Disorientation, unspecified: Secondary | ICD-10-CM | POA: Diagnosis not present

## 2015-01-10 DIAGNOSIS — I252 Old myocardial infarction: Secondary | ICD-10-CM | POA: Diagnosis not present

## 2015-01-10 DIAGNOSIS — B9620 Unspecified Escherichia coli [E. coli] as the cause of diseases classified elsewhere: Secondary | ICD-10-CM | POA: Diagnosis not present

## 2015-01-10 DIAGNOSIS — Z90710 Acquired absence of both cervix and uterus: Secondary | ICD-10-CM | POA: Diagnosis not present

## 2015-01-10 DIAGNOSIS — I509 Heart failure, unspecified: Secondary | ICD-10-CM | POA: Diagnosis not present

## 2015-01-10 DIAGNOSIS — K219 Gastro-esophageal reflux disease without esophagitis: Secondary | ICD-10-CM | POA: Diagnosis not present

## 2015-01-10 DIAGNOSIS — E119 Type 2 diabetes mellitus without complications: Secondary | ICD-10-CM | POA: Diagnosis not present

## 2015-01-11 DIAGNOSIS — R51 Headache: Secondary | ICD-10-CM | POA: Diagnosis not present

## 2015-01-11 DIAGNOSIS — I10 Essential (primary) hypertension: Secondary | ICD-10-CM | POA: Diagnosis not present

## 2015-01-11 DIAGNOSIS — N390 Urinary tract infection, site not specified: Secondary | ICD-10-CM | POA: Diagnosis not present

## 2015-01-13 ENCOUNTER — Encounter (HOSPITAL_COMMUNITY): Payer: Self-pay | Admitting: General Practice

## 2015-01-13 ENCOUNTER — Inpatient Hospital Stay (HOSPITAL_COMMUNITY)
Admission: AD | Admit: 2015-01-13 | Discharge: 2015-01-23 | DRG: 480 | Disposition: A | Discharge status: Skilled Nursing Facility | Payer: Medicare Other | Source: Other Acute Inpatient Hospital | Attending: Internal Medicine | Admitting: Internal Medicine

## 2015-01-13 ENCOUNTER — Inpatient Hospital Stay (HOSPITAL_COMMUNITY): Payer: Medicare Other

## 2015-01-13 DIAGNOSIS — Z01818 Encounter for other preprocedural examination: Secondary | ICD-10-CM | POA: Diagnosis not present

## 2015-01-13 DIAGNOSIS — I272 Other secondary pulmonary hypertension: Secondary | ICD-10-CM | POA: Diagnosis present

## 2015-01-13 DIAGNOSIS — I4510 Unspecified right bundle-branch block: Secondary | ICD-10-CM | POA: Diagnosis not present

## 2015-01-13 DIAGNOSIS — D689 Coagulation defect, unspecified: Secondary | ICD-10-CM | POA: Diagnosis not present

## 2015-01-13 DIAGNOSIS — J9611 Chronic respiratory failure with hypoxia: Secondary | ICD-10-CM

## 2015-01-13 DIAGNOSIS — Z9981 Dependence on supplemental oxygen: Secondary | ICD-10-CM

## 2015-01-13 DIAGNOSIS — I340 Nonrheumatic mitral (valve) insufficiency: Secondary | ICD-10-CM | POA: Diagnosis present

## 2015-01-13 DIAGNOSIS — N183 Chronic kidney disease, stage 3 unspecified: Secondary | ICD-10-CM | POA: Diagnosis present

## 2015-01-13 DIAGNOSIS — R51 Headache: Secondary | ICD-10-CM | POA: Diagnosis not present

## 2015-01-13 DIAGNOSIS — J9811 Atelectasis: Secondary | ICD-10-CM | POA: Diagnosis not present

## 2015-01-13 DIAGNOSIS — M9971 Connective tissue and disc stenosis of intervertebral foramina of cervical region: Secondary | ICD-10-CM | POA: Diagnosis not present

## 2015-01-13 DIAGNOSIS — S72002D Fracture of unspecified part of neck of left femur, subsequent encounter for closed fracture with routine healing: Secondary | ICD-10-CM | POA: Diagnosis not present

## 2015-01-13 DIAGNOSIS — E1122 Type 2 diabetes mellitus with diabetic chronic kidney disease: Secondary | ICD-10-CM | POA: Diagnosis present

## 2015-01-13 DIAGNOSIS — M542 Cervicalgia: Secondary | ICD-10-CM | POA: Diagnosis not present

## 2015-01-13 DIAGNOSIS — I129 Hypertensive chronic kidney disease with stage 1 through stage 4 chronic kidney disease, or unspecified chronic kidney disease: Secondary | ICD-10-CM | POA: Diagnosis present

## 2015-01-13 DIAGNOSIS — J449 Chronic obstructive pulmonary disease, unspecified: Secondary | ICD-10-CM | POA: Diagnosis not present

## 2015-01-13 DIAGNOSIS — E876 Hypokalemia: Secondary | ICD-10-CM | POA: Diagnosis not present

## 2015-01-13 DIAGNOSIS — S72142A Displaced intertrochanteric fracture of left femur, initial encounter for closed fracture: Secondary | ICD-10-CM | POA: Diagnosis not present

## 2015-01-13 DIAGNOSIS — Z419 Encounter for procedure for purposes other than remedying health state, unspecified: Secondary | ICD-10-CM

## 2015-01-13 DIAGNOSIS — R278 Other lack of coordination: Secondary | ICD-10-CM | POA: Diagnosis not present

## 2015-01-13 DIAGNOSIS — S72142D Displaced intertrochanteric fracture of left femur, subsequent encounter for closed fracture with routine healing: Secondary | ICD-10-CM | POA: Diagnosis not present

## 2015-01-13 DIAGNOSIS — E785 Hyperlipidemia, unspecified: Secondary | ICD-10-CM | POA: Diagnosis not present

## 2015-01-13 DIAGNOSIS — I517 Cardiomegaly: Secondary | ICD-10-CM | POA: Diagnosis not present

## 2015-01-13 DIAGNOSIS — I5033 Acute on chronic diastolic (congestive) heart failure: Secondary | ICD-10-CM | POA: Diagnosis not present

## 2015-01-13 DIAGNOSIS — I959 Hypotension, unspecified: Secondary | ICD-10-CM | POA: Diagnosis not present

## 2015-01-13 DIAGNOSIS — Z01810 Encounter for preprocedural cardiovascular examination: Secondary | ICD-10-CM | POA: Diagnosis not present

## 2015-01-13 DIAGNOSIS — N390 Urinary tract infection, site not specified: Secondary | ICD-10-CM | POA: Diagnosis not present

## 2015-01-13 DIAGNOSIS — R601 Generalized edema: Secondary | ICD-10-CM | POA: Diagnosis not present

## 2015-01-13 DIAGNOSIS — S72132A Displaced apophyseal fracture of left femur, initial encounter for closed fracture: Secondary | ICD-10-CM | POA: Diagnosis not present

## 2015-01-13 DIAGNOSIS — R079 Chest pain, unspecified: Secondary | ICD-10-CM | POA: Diagnosis not present

## 2015-01-13 DIAGNOSIS — Z8673 Personal history of transient ischemic attack (TIA), and cerebral infarction without residual deficits: Secondary | ICD-10-CM | POA: Diagnosis not present

## 2015-01-13 DIAGNOSIS — R0902 Hypoxemia: Secondary | ICD-10-CM

## 2015-01-13 DIAGNOSIS — J9621 Acute and chronic respiratory failure with hypoxia: Secondary | ICD-10-CM | POA: Diagnosis not present

## 2015-01-13 DIAGNOSIS — I4891 Unspecified atrial fibrillation: Secondary | ICD-10-CM | POA: Diagnosis not present

## 2015-01-13 DIAGNOSIS — I6529 Occlusion and stenosis of unspecified carotid artery: Secondary | ICD-10-CM | POA: Diagnosis present

## 2015-01-13 DIAGNOSIS — Z9861 Coronary angioplasty status: Secondary | ICD-10-CM

## 2015-01-13 DIAGNOSIS — E119 Type 2 diabetes mellitus without complications: Secondary | ICD-10-CM | POA: Diagnosis not present

## 2015-01-13 DIAGNOSIS — S199XXA Unspecified injury of neck, initial encounter: Secondary | ICD-10-CM | POA: Diagnosis not present

## 2015-01-13 DIAGNOSIS — I739 Peripheral vascular disease, unspecified: Secondary | ICD-10-CM | POA: Diagnosis present

## 2015-01-13 DIAGNOSIS — I5032 Chronic diastolic (congestive) heart failure: Secondary | ICD-10-CM | POA: Diagnosis not present

## 2015-01-13 DIAGNOSIS — M62838 Other muscle spasm: Secondary | ICD-10-CM | POA: Diagnosis not present

## 2015-01-13 DIAGNOSIS — W19XXXA Unspecified fall, initial encounter: Secondary | ICD-10-CM | POA: Diagnosis not present

## 2015-01-13 DIAGNOSIS — I248 Other forms of acute ischemic heart disease: Secondary | ICD-10-CM | POA: Diagnosis not present

## 2015-01-13 DIAGNOSIS — I440 Atrioventricular block, first degree: Secondary | ICD-10-CM | POA: Diagnosis present

## 2015-01-13 DIAGNOSIS — R519 Headache, unspecified: Secondary | ICD-10-CM

## 2015-01-13 DIAGNOSIS — D62 Acute posthemorrhagic anemia: Secondary | ICD-10-CM | POA: Diagnosis not present

## 2015-01-13 DIAGNOSIS — W06XXXA Fall from bed, initial encounter: Secondary | ICD-10-CM | POA: Diagnosis not present

## 2015-01-13 DIAGNOSIS — I2510 Atherosclerotic heart disease of native coronary artery without angina pectoris: Secondary | ICD-10-CM | POA: Diagnosis present

## 2015-01-13 DIAGNOSIS — M4802 Spinal stenosis, cervical region: Secondary | ICD-10-CM | POA: Diagnosis not present

## 2015-01-13 DIAGNOSIS — Z955 Presence of coronary angioplasty implant and graft: Secondary | ICD-10-CM | POA: Diagnosis not present

## 2015-01-13 DIAGNOSIS — Z9181 History of falling: Secondary | ICD-10-CM | POA: Diagnosis not present

## 2015-01-13 DIAGNOSIS — I081 Rheumatic disorders of both mitral and tricuspid valves: Secondary | ICD-10-CM | POA: Diagnosis not present

## 2015-01-13 DIAGNOSIS — S72002A Fracture of unspecified part of neck of left femur, initial encounter for closed fracture: Secondary | ICD-10-CM | POA: Diagnosis not present

## 2015-01-13 DIAGNOSIS — I480 Paroxysmal atrial fibrillation: Secondary | ICD-10-CM | POA: Diagnosis not present

## 2015-01-13 DIAGNOSIS — F419 Anxiety disorder, unspecified: Secondary | ICD-10-CM | POA: Diagnosis not present

## 2015-01-13 DIAGNOSIS — Z7982 Long term (current) use of aspirin: Secondary | ICD-10-CM | POA: Diagnosis not present

## 2015-01-13 DIAGNOSIS — B9620 Unspecified Escherichia coli [E. coli] as the cause of diseases classified elsewhere: Secondary | ICD-10-CM | POA: Diagnosis not present

## 2015-01-13 DIAGNOSIS — E11649 Type 2 diabetes mellitus with hypoglycemia without coma: Secondary | ICD-10-CM | POA: Diagnosis not present

## 2015-01-13 DIAGNOSIS — Z794 Long term (current) use of insulin: Secondary | ICD-10-CM

## 2015-01-13 DIAGNOSIS — K219 Gastro-esophageal reflux disease without esophagitis: Secondary | ICD-10-CM | POA: Diagnosis not present

## 2015-01-13 DIAGNOSIS — R0602 Shortness of breath: Secondary | ICD-10-CM

## 2015-01-13 DIAGNOSIS — I10 Essential (primary) hypertension: Secondary | ICD-10-CM | POA: Diagnosis not present

## 2015-01-13 DIAGNOSIS — Z87891 Personal history of nicotine dependence: Secondary | ICD-10-CM

## 2015-01-13 DIAGNOSIS — M25552 Pain in left hip: Secondary | ICD-10-CM | POA: Diagnosis present

## 2015-01-13 DIAGNOSIS — Z4789 Encounter for other orthopedic aftercare: Secondary | ICD-10-CM | POA: Diagnosis not present

## 2015-01-13 DIAGNOSIS — J9622 Acute and chronic respiratory failure with hypercapnia: Secondary | ICD-10-CM | POA: Diagnosis not present

## 2015-01-13 DIAGNOSIS — R1312 Dysphagia, oropharyngeal phase: Secondary | ICD-10-CM | POA: Diagnosis not present

## 2015-01-13 DIAGNOSIS — J439 Emphysema, unspecified: Secondary | ICD-10-CM | POA: Diagnosis not present

## 2015-01-13 DIAGNOSIS — S72009A Fracture of unspecified part of neck of unspecified femur, initial encounter for closed fracture: Secondary | ICD-10-CM | POA: Diagnosis present

## 2015-01-13 DIAGNOSIS — M21251 Flexion deformity, right hip: Secondary | ICD-10-CM | POA: Diagnosis not present

## 2015-01-13 DIAGNOSIS — S7290XA Unspecified fracture of unspecified femur, initial encounter for closed fracture: Secondary | ICD-10-CM | POA: Diagnosis not present

## 2015-01-13 DIAGNOSIS — S72001D Fracture of unspecified part of neck of right femur, subsequent encounter for closed fracture with routine healing: Secondary | ICD-10-CM | POA: Diagnosis not present

## 2015-01-13 DIAGNOSIS — R2681 Unsteadiness on feet: Secondary | ICD-10-CM | POA: Diagnosis not present

## 2015-01-13 DIAGNOSIS — I482 Chronic atrial fibrillation: Secondary | ICD-10-CM | POA: Diagnosis present

## 2015-01-13 DIAGNOSIS — M47812 Spondylosis without myelopathy or radiculopathy, cervical region: Secondary | ICD-10-CM | POA: Diagnosis not present

## 2015-01-13 DIAGNOSIS — S72122A Displaced fracture of lesser trochanter of left femur, initial encounter for closed fracture: Secondary | ICD-10-CM | POA: Diagnosis not present

## 2015-01-13 DIAGNOSIS — S069X9A Unspecified intracranial injury with loss of consciousness of unspecified duration, initial encounter: Secondary | ICD-10-CM | POA: Diagnosis not present

## 2015-01-13 DIAGNOSIS — Z7901 Long term (current) use of anticoagulants: Secondary | ICD-10-CM | POA: Diagnosis not present

## 2015-01-13 DIAGNOSIS — M6281 Muscle weakness (generalized): Secondary | ICD-10-CM | POA: Diagnosis not present

## 2015-01-13 HISTORY — DX: Sleep apnea, unspecified: G47.30

## 2015-01-13 HISTORY — DX: Acute myocardial infarction, unspecified: I21.9

## 2015-01-13 HISTORY — DX: Other chronic pain: G89.29

## 2015-01-13 HISTORY — DX: Cerebral infarction, unspecified: I63.9

## 2015-01-13 HISTORY — DX: Low back pain: M54.5

## 2015-01-13 HISTORY — DX: Type 2 diabetes mellitus without complications: E11.9

## 2015-01-13 HISTORY — DX: Dependence on supplemental oxygen: Z99.81

## 2015-01-13 LAB — COMPREHENSIVE METABOLIC PANEL
ALBUMIN: 2.8 g/dL — AB (ref 3.5–5.0)
ALT: 18 U/L (ref 14–54)
ANION GAP: 12 (ref 5–15)
AST: 24 U/L (ref 15–41)
Alkaline Phosphatase: 71 U/L (ref 38–126)
BUN: 27 mg/dL — AB (ref 6–20)
CALCIUM: 8.8 mg/dL — AB (ref 8.9–10.3)
CO2: 27 mmol/L (ref 22–32)
CREATININE: 1.32 mg/dL — AB (ref 0.44–1.00)
Chloride: 104 mmol/L (ref 101–111)
GFR, EST AFRICAN AMERICAN: 42 mL/min — AB (ref >60)
GFR, EST NON AFRICAN AMERICAN: 36 mL/min — AB (ref >60)
Glucose, Bld: 153 mg/dL — ABNORMAL HIGH (ref 65–99)
POTASSIUM: 3.9 mmol/L (ref 3.5–5.1)
SODIUM: 143 mmol/L (ref 135–145)
Total Bilirubin: 0.9 mg/dL (ref 0.3–1.2)
Total Protein: 7 g/dL (ref 6.5–8.1)

## 2015-01-13 LAB — GLUCOSE, CAPILLARY
GLUCOSE-CAPILLARY: 159 mg/dL — AB (ref 65–99)
Glucose-Capillary: 125 mg/dL — ABNORMAL HIGH (ref 65–99)
Glucose-Capillary: 169 mg/dL — ABNORMAL HIGH (ref 65–99)

## 2015-01-13 LAB — CBC
HCT: 36.4 % (ref 36.0–46.0)
HEMOGLOBIN: 11.2 g/dL — AB (ref 12.0–15.0)
MCH: 30.6 pg (ref 26.0–34.0)
MCHC: 30.8 g/dL (ref 30.0–36.0)
MCV: 99.5 fL (ref 78.0–100.0)
PLATELETS: 222 10*3/uL (ref 150–400)
RBC: 3.66 MIL/uL — AB (ref 3.87–5.11)
RDW: 13.7 % (ref 11.5–15.5)
WBC: 8.3 10*3/uL (ref 4.0–10.5)

## 2015-01-13 LAB — URINALYSIS, ROUTINE W REFLEX MICROSCOPIC
Bilirubin Urine: NEGATIVE
GLUCOSE, UA: NEGATIVE mg/dL
KETONES UR: 15 mg/dL — AB
Nitrite: NEGATIVE
Protein, ur: NEGATIVE mg/dL
Specific Gravity, Urine: 1.01 (ref 1.005–1.030)
Urobilinogen, UA: 0.2 mg/dL (ref 0.0–1.0)
pH: 5 (ref 5.0–8.0)

## 2015-01-13 LAB — URINE MICROSCOPIC-ADD ON

## 2015-01-13 LAB — PROTIME-INR
INR: 4.23 — ABNORMAL HIGH (ref 0.00–1.49)
PROTHROMBIN TIME: 39.7 s — AB (ref 11.6–15.2)

## 2015-01-13 MED ORDER — POTASSIUM CHLORIDE CRYS ER 20 MEQ PO TBCR
20.0000 meq | EXTENDED_RELEASE_TABLET | Freq: Every day | ORAL | Status: DC
  Administered 2015-01-14 – 2015-01-18 (×5): 20 meq via ORAL
  Filled 2015-01-13 (×6): qty 1

## 2015-01-13 MED ORDER — PANTOPRAZOLE SODIUM 40 MG PO TBEC
40.0000 mg | DELAYED_RELEASE_TABLET | Freq: Two times a day (BID) | ORAL | Status: DC
  Administered 2015-01-13 – 2015-01-23 (×17): 40 mg via ORAL
  Filled 2015-01-13 (×17): qty 1

## 2015-01-13 MED ORDER — FLUTICASONE PROPIONATE 50 MCG/ACT NA SUSP
1.0000 | Freq: Two times a day (BID) | NASAL | Status: DC
  Administered 2015-01-13 – 2015-01-23 (×17): 1 via NASAL
  Filled 2015-01-13 (×2): qty 16

## 2015-01-13 MED ORDER — SODIUM CHLORIDE 0.45 % IV SOLN
INTRAVENOUS | Status: DC
  Administered 2015-01-13: 20:00:00 via INTRAVENOUS

## 2015-01-13 MED ORDER — VITAMIN D3 25 MCG (1000 UNIT) PO TABS
1000.0000 [IU] | ORAL_TABLET | Freq: Every day | ORAL | Status: DC
  Administered 2015-01-14 – 2015-01-23 (×10): 1000 [IU] via ORAL
  Filled 2015-01-13 (×12): qty 1

## 2015-01-13 MED ORDER — AZELASTINE HCL 0.15 % NA SOLN: 1.0000 | Freq: Two times a day (BID) | NASAL | Status: DC | PRN

## 2015-01-13 MED ORDER — AZELASTINE HCL 0.1 % NA SOLN: 1.0000 | Freq: Two times a day (BID) | NASAL | Status: DC | PRN

## 2015-01-13 MED ORDER — IPRATROPIUM-ALBUTEROL 20-100 MCG/ACT IN AERS: 1.0000 | INHALATION_SPRAY | Freq: Four times a day (QID) | RESPIRATORY_TRACT | Status: DC | PRN

## 2015-01-13 MED ORDER — AMIODARONE HCL 100 MG PO TABS
100.0000 mg | ORAL_TABLET | Freq: Every day | ORAL | Status: DC
  Administered 2015-01-14 – 2015-01-23 (×10): 100 mg via ORAL
  Filled 2015-01-13 (×10): qty 1

## 2015-01-13 MED ORDER — INSULIN ASPART 100 UNIT/ML ~~LOC~~ SOLN
0.0000 [IU] | Freq: Three times a day (TID) | SUBCUTANEOUS | Status: DC
  Administered 2015-01-14 – 2015-01-15 (×2): 1 [IU] via SUBCUTANEOUS
  Administered 2015-01-16: 3 [IU] via SUBCUTANEOUS
  Administered 2015-01-16: 2 [IU] via SUBCUTANEOUS
  Administered 2015-01-16: 1 [IU] via SUBCUTANEOUS
  Administered 2015-01-17: 2 [IU] via SUBCUTANEOUS
  Administered 2015-01-17: 5 [IU] via SUBCUTANEOUS
  Administered 2015-01-18: 1 [IU] via SUBCUTANEOUS
  Administered 2015-01-18 – 2015-01-19 (×2): 2 [IU] via SUBCUTANEOUS
  Administered 2015-01-19 – 2015-01-20 (×2): 1 [IU] via SUBCUTANEOUS
  Administered 2015-01-21: 2 [IU] via SUBCUTANEOUS
  Administered 2015-01-22: 3 [IU] via SUBCUTANEOUS

## 2015-01-13 MED ORDER — FUROSEMIDE 40 MG PO TABS
40.0000 mg | ORAL_TABLET | Freq: Every day | ORAL | Status: DC
  Administered 2015-01-14 – 2015-01-15 (×2): 40 mg via ORAL
  Filled 2015-01-13 (×2): qty 1

## 2015-01-13 MED ORDER — ASPIRIN 81 MG PO CHEW
81.0000 mg | CHEWABLE_TABLET | Freq: Every day | ORAL | Status: DC
  Administered 2015-01-14 – 2015-01-23 (×10): 81 mg via ORAL
  Filled 2015-01-13 (×10): qty 1

## 2015-01-13 MED ORDER — IPRATROPIUM-ALBUTEROL 0.5-2.5 (3) MG/3ML IN SOLN
3.0000 mL | Freq: Four times a day (QID) | RESPIRATORY_TRACT | Status: DC | PRN
  Administered 2015-01-17 – 2015-01-21 (×5): 3 mL via RESPIRATORY_TRACT
  Filled 2015-01-13 (×5): qty 3

## 2015-01-13 MED ORDER — VITAMIN K1 10 MG/ML IJ SOLN
10.0000 mg | Freq: Once | INTRAMUSCULAR | Status: DC
  Filled 2015-01-13: qty 1

## 2015-01-13 MED ORDER — BUDESONIDE-FORMOTEROL FUMARATE 160-4.5 MCG/ACT IN AERO
1.0000 | INHALATION_SPRAY | Freq: Two times a day (BID) | RESPIRATORY_TRACT | Status: DC
  Administered 2015-01-13 – 2015-01-23 (×18): 1 via RESPIRATORY_TRACT
  Filled 2015-01-13 (×2): qty 6

## 2015-01-13 MED ORDER — FLEET ENEMA 7-19 GM/118ML RE ENEM
1.0000 | ENEMA | Freq: Once | RECTAL | Status: AC | PRN
  Filled 2015-01-13: qty 1

## 2015-01-13 MED ORDER — NIFEDIPINE ER 30 MG PO TB24
30.0000 mg | ORAL_TABLET | Freq: Every day | ORAL | Status: DC
  Administered 2015-01-14 – 2015-01-17 (×4): 30 mg via ORAL
  Filled 2015-01-13 (×5): qty 1

## 2015-01-13 MED ORDER — METOPROLOL SUCCINATE 12.5 MG HALF TABLET
12.5000 mg | ORAL_TABLET | Freq: Every day | ORAL | Status: DC
  Administered 2015-01-14 – 2015-01-16 (×3): 12.5 mg via ORAL
  Filled 2015-01-13 (×3): qty 1

## 2015-01-13 MED ORDER — POLYETHYLENE GLYCOL 3350 17 G PO PACK
17.0000 g | PACK | Freq: Every day | ORAL | Status: DC | PRN
  Administered 2015-01-16 – 2015-01-18 (×2): 17 g via ORAL
  Filled 2015-01-13 (×3): qty 1

## 2015-01-13 MED ORDER — INSULIN DETEMIR 100 UNIT/ML ~~LOC~~ SOLN
16.0000 [IU] | Freq: Every morning | SUBCUTANEOUS | Status: DC
  Administered 2015-01-14: 16 [IU] via SUBCUTANEOUS
  Filled 2015-01-13 (×2): qty 0.16

## 2015-01-13 MED ORDER — SENNOSIDES-DOCUSATE SODIUM 8.6-50 MG PO TABS
1.0000 | ORAL_TABLET | Freq: Two times a day (BID) | ORAL | Status: DC
  Administered 2015-01-13 – 2015-01-22 (×16): 1 via ORAL
  Filled 2015-01-13 (×20): qty 1

## 2015-01-13 MED ORDER — VITAMIN C 500 MG PO TABS
500.0000 mg | ORAL_TABLET | Freq: Every day | ORAL | Status: DC
  Administered 2015-01-13 – 2015-01-22 (×8): 500 mg via ORAL
  Filled 2015-01-13 (×12): qty 1

## 2015-01-13 MED ORDER — ATORVASTATIN CALCIUM 40 MG PO TABS
40.0000 mg | ORAL_TABLET | Freq: Every day | ORAL | Status: DC
  Administered 2015-01-13 – 2015-01-22 (×8): 40 mg via ORAL
  Filled 2015-01-13 (×12): qty 1

## 2015-01-13 MED ORDER — HYDROMORPHONE HCL 1 MG/ML IJ SOLN
0.5000 mg | INTRAMUSCULAR | Status: DC | PRN
  Administered 2015-01-13 – 2015-01-14 (×5): 0.5 mg via INTRAVENOUS
  Filled 2015-01-13 (×5): qty 1

## 2015-01-13 MED ORDER — INSULIN ASPART 100 UNIT/ML ~~LOC~~ SOLN: 0.0000 [IU] | Freq: Every day | SUBCUTANEOUS | Status: DC

## 2015-01-13 MED ORDER — ISOSORBIDE MONONITRATE ER 60 MG PO TB24
60.0000 mg | ORAL_TABLET | Freq: Every day | ORAL | Status: DC
  Administered 2015-01-14 – 2015-01-17 (×4): 60 mg via ORAL
  Filled 2015-01-13 (×5): qty 1

## 2015-01-13 MED ORDER — ENOXAPARIN SODIUM 40 MG/0.4ML ~~LOC~~ SOLN
40.0000 mg | SUBCUTANEOUS | Status: DC
  Filled 2015-01-13: qty 0.4

## 2015-01-13 NOTE — Progress Notes (Signed)
[name] [id number] Admitted to 5W 25: 01/13/2015 8:01 PM Attending Provider: Roney Jaffe, MD    [name] is a 81 y.o. female patient admitted from ED awake, alert  & orientated  X 3,  Prior, VSS - Blood pressure 149/63, pulse 78, temperature 97.8 F (36.6 C), temperature source Oral, resp. rate 15, SpO2 96 %., O2    3 L nasal cannular, no c/o shortness of breath, no c/o chest pain, no distress noted. Tele # [id number] placed and pt is currently running:normal sinus rhythm.   IV site WDL:  forearm right, condition patent and no redness with a transparent dsg that's clean dry and intact.  Allergies:   Allergies  Allergen Reactions  . Atorvastatin Other (See Comments)     muscle aches  . Codeine Nausea And Vomiting  . Morphine Nausea And Vomiting  . Other Other (See Comments)    OPIATES cause nausea and vomiting  . Rosuvastatin Other (See Comments)    muscle aches at high doses, held as of 12/2010 due to aches  . Iohexol Other (See Comments)     Desc: unknown reaction; allergic to iodine and contrast      Past Medical History  Diagnosis Date  . Hyperlipidemia   . Hypertension   . Anxiety   . GERD (gastroesophageal reflux disease)   . CAD (coronary artery disease)     Cath in 2006 showed an occluded RCA with left-to-right collaterals & otherwise noncritical CAD with EF=25-30% at that time. EF subsequently improved to normal by 2D ECHO. Cath Aug. 2011 showed unchanged anatomy. > medical therapy recommended  . Diastolic dysfunction     Grade II  . RBBB (right bundle branch block)     Chronic  . Paroxysmal atrial fibrillation   . Hypoxemic respiratory failure, chronic     uses 2.5 liters of oxygen with sleep  . COPD (chronic obstructive pulmonary disease)     GOLD 4 - PFT 06/08/10 FEV1 0.93 (62%), FEV1% 60, TLC 2.84 (69%0, DLCO 54%, +BD) On home O2.  . Secondary pulmonary hypertension   . Edema   . Angina   . Shortness of breath   . CHF (congestive heart failure)    . Diabetes mellitus     Type II - goal A1C is 8, to avoid hypoglycemia  . Bradycardia 08/24/2011  . Carotid artery disease     Moderate, right greater than left which we following by duplex ultrasound. [place] 12/05/11 = RIght Bulb/Prox ICA: Moderate to severe amt of fibrous plaque elevating velocities w/in prox segment of ICA. Consistent w/a 50-69% diameter reduction. Left Bulb/Prox ICA: Moderate amt of fibrous plaque slightly elevating velocities w/in the prox segment of the ICA. Consistent w/a 0-49% diameter reduction.   Marland Kitchen PAF (paroxysmal atrial fibrillation)     In the past, on coumadin  . CHF (congestive heart failure)     Hospitalized 08/22/11-08/26/11 with CHF secondary to diastolic dysfunction & hospitilized in 07/2012 with a similar problem. TTE 08/23/11 = normal EF.  Marland Kitchen History of stress test 07/02/09    Mild perfusion defect seen in the Basal Inferior region(s). This is consistent with an infarct/scar.  Post-stress EF=56%. Global LV systolic function is normal.  EKG is negative for ischemia.  No significant iscemia detected. Low risk scan.  . Lower extremity edema     ECHO 07/21/12 = EF 55-60%, performed for TIA. Responding to diurectics. Continue diuresis w/ a goal dry weight of <160lb. Venous Duplex 07/27/11 = Right lower extremity:  no evidence of thrombus or thrombophlebitis. Essentially normal right lower extremity venous duplex Doppler evaluation.  . Carotid bruit   . Mitral valve regurgitation     Mild to moderate by ECHO 07/21/12  . Sleep-related hypoventilation   . COPD with emphysema   . Coronary atherosclerosis of unspecified type of vessel, native or graft   . Cataract     History:  Will be obtained from the patient & family.  Pt. & family orientation to unit, room and routine. Information packet given to patient/family and safety video watched.  Admission INP armband ID verified with patient/family, and in place. SR up x 2, fall risk assessment complete with Patient and family  verbalizing understanding of risks associated with falls. Pt verbalizes an understanding of how to use the call bell and to call for help before getting out of bed.  Skin, clean-dry- intact without evidence of bruising, or skin tears.   No evidence of skin break down noted on exam.  Dr. Jonnie Finner paged and informed of admission.    Will cont to monitor and assist as needed.  Lindalou Hose, RN 01/13/2015 8:01 PM

## 2015-01-13 NOTE — H&P (Deleted)
[facility] History and Physical  [name] WFU:[id number] DOB: Nov 20, 1927 DOA: 01/13/2015  Referring physician: Dr Sherril Cong PCP: Redge Gainer, MD  Chief Complaint: left hip fracture  HPI: [name] is a 81 y.o. female with hx of DM2, HTN, anxiety, CAD (as below), COPD on home O2, afib, CHF (EF 25% improved to normal) who was admitted to [facility] on 7/14 for headaches and neck pain. W/U showed EColi UTI and pt rec'd 4 days of IV Rocephin. Head CT showed old lacunar infarct. She became confused/ delirium in the hospital. She got up last night , fell and broke her L hip (intertrochanteric fx). She was transferred to [facility] for ortho eval and surgery.  Mental status has improved and is oriented x 3.  Family provides most hx.   No recent CP, SOB, cough, fevers.  No recent n/v/d.     Heart cath in 2006 showed an occluded RCA with left-to-right collaterals & otherwise noncritical CAD with EF=25-30% at that time. EF subsequently improved to normal by 2D ECHO. Cath Aug. 2011 showed unchanged anatomy. > medical therapy recommended  Patient lives home alone, cooks and cleans prior to admission Can patient participate in ADLs? yes  Past Medical History  Past Medical History  Diagnosis Date  . Hyperlipidemia   . Hypertension   . Anxiety   . GERD (gastroesophageal reflux disease)   . CAD (coronary artery disease)     Cath in 2006 showed an occluded RCA with left-to-right collaterals & otherwise noncritical CAD with EF=25-30% at that time. EF subsequently improved to normal by 2D ECHO. Cath Aug. 2011 showed unchanged anatomy. > medical therapy recommended  . Diastolic dysfunction     Grade II  . RBBB (right bundle branch block)     Chronic  . Paroxysmal atrial fibrillation   . Hypoxemic respiratory failure, chronic     uses 2.5 liters of oxygen with sleep  . COPD (chronic obstructive pulmonary disease)     GOLD 4 - PFT 06/08/10 FEV1 0.93 (62%), FEV1% 60, TLC 2.84  (69%0, DLCO 54%, +BD) On home O2.  . Secondary pulmonary hypertension   . Edema   . Angina   . Shortness of breath   . CHF (congestive heart failure)   . Diabetes mellitus     Type II - goal A1C is 8, to avoid hypoglycemia  . Bradycardia 08/24/2011  . Carotid artery disease     Moderate, right greater than left which we following by duplex ultrasound. [place] 12/05/11 = RIght Bulb/Prox ICA: Moderate to severe amt of fibrous plaque elevating velocities w/in prox segment of ICA. Consistent w/a 50-69% diameter reduction. Left Bulb/Prox ICA: Moderate amt of fibrous plaque slightly elevating velocities w/in the prox segment of the ICA. Consistent w/a 0-49% diameter reduction.   Marland Kitchen PAF (paroxysmal atrial fibrillation)     In the past, on coumadin  . CHF (congestive heart failure)     Hospitalized 08/22/11-08/26/11 with CHF secondary to diastolic dysfunction & hospitilized in 07/2012 with a similar problem. TTE 08/23/11 = normal EF.  Marland Kitchen History of stress test 07/02/09    Mild perfusion defect seen in the Basal Inferior region(s). This is consistent with an infarct/scar.  Post-stress EF=56%. Global LV systolic function is normal.  EKG is negative for ischemia.  No significant iscemia detected. Low risk scan.  . Lower extremity edema     ECHO 07/21/12 = EF 55-60%, performed for TIA. Responding to diurectics. Continue diuresis w/ a goal dry weight of <160lb.  Venous Duplex 07/27/11 = Right lower extremity: no evidence of thrombus or thrombophlebitis. Essentially normal right lower extremity venous duplex Doppler evaluation.  . Carotid bruit   . Mitral valve regurgitation     Mild to moderate by ECHO 07/21/12  . Sleep-related hypoventilation   . COPD with emphysema   . Coronary atherosclerosis of unspecified type of vessel, native or graft   . Cataract    Past Surgical History  Past Surgical History  Procedure Laterality Date  . Rotator cuff repair  07/2005    right  . Cardiac catheterization  2006 & 2011    Cath  in 2006 showed an occluded RCA with left-to-right collaterals & otherwise noncritical CAD with EF=25-30% at that time. EF subsequently improved to normal by 2D ECHO. Cath Aug. 2011 showed unchanged anatomy. > medical therapy recommended.  . Coronary angioplasty    . Coronary angioplasty with stent placement      x2  . Carotid duplex  12/05/11    Moderate, right greater than left which we following by duplex ultrasound. [place] 12/05/11 = RIght Bulb/Prox ICA: Moderate to severe amt of fibrous plaque elevating velocities w/in prox segment of ICA. Consistent w/a 50-69% diameter reduction. Left Bulb/Prox ICA: Moderate amt of fibrous plaque slightly elevating velocities w/in the prox segment of the ICA. Consistent w/a 0-49% diameter reduction.   . Venous duplex  07/27/11    Venous Duplex 07/27/11 = Right lower extremity: no evidence of thrombus or thrombophlebitis. Essentially normal right lower extremity venous duplex Doppler evaluation.  . Abdominal hysterectomy      complete  . Transvaginal tape (tvt) removal    . Eye surgery     Family History  Family History  Problem Relation Age of Onset  . Cancer Mother     Skin  . Heart disease Mother   . Emphysema Father   . Heart disease Father   . Congestive Heart Failure Father   . Heart disease Brother   . Heart disease Brother   . Early death Sister    Social History  reports that she quit smoking about 28 years ago. Her smoking use included Cigarettes. She has a 22.5 pack-year smoking history. She has never used smokeless tobacco. She reports that she drinks alcohol. She reports that she does not use illicit drugs. Allergies  Allergies  Allergen Reactions  . Atorvastatin Other (See Comments)     muscle aches  . Codeine Nausea And Vomiting  . Morphine Nausea And Vomiting  . Other Other (See Comments)    OPIATES cause nausea and vomiting  . Rosuvastatin Other (See Comments)    muscle aches at high doses, held as of 12/2010 due to aches  . Iohexol  Other (See Comments)     Desc: unknown reaction; allergic to iodine and contrast    Home medications Prior to Admission medications   Medication Sig Start Date End Date Taking? Authorizing Provider  ALPRAZolam Duanne Moron) 0.25 MG tablet TAKE 1/2 TO 1 TABLET 2 TIMES A DAY AS NEEDED FOR ANXIETY 06/30/14   Chipper Herb, MD  amiodarone (PACERONE) 200 MG tablet Take 0.5 tablets (100 mg total) by mouth daily. Please keep upcoming appointment 12/30/14   Lorretta Harp, MD  aspirin 81 MG chewable tablet Chew 81 mg by mouth daily.    Historical Provider, MD  atorvastatin (LIPITOR) 40 MG tablet Take 1 tablet (40 mg total) by mouth every evening. 11/18/14   Lorretta Harp, MD  Azelastine HCl 0.15 % SOLN  Place 1 spray into both nostrils 2 (two) times daily as needed (congestion).    Historical Provider, MD  budesonide-formoterol (SYMBICORT) 160-4.5 MCG/ACT inhaler Inhale 1 puff into the lungs 2 (two) times daily. 01/24/14   Chipper Herb, MD  Cholecalciferol (VITAMIN D PO) Take 1 capsule by mouth daily.     Historical Provider, MD  fluticasone (FLONASE) 50 MCG/ACT nasal spray Place 1 spray into both nostrils 2 (two) times daily. 12/24/13   Chesley Mires, MD  furosemide (LASIX) 40 MG tablet Take 1 tablet (40 mg total) by mouth daily. May take extra dose if weight increases 3.5 pounds in 24 hrs as needed for fluid and edema. 09/11/14   Lorretta Harp, MD  glucosamine-chondroitin 500-400 MG tablet Take 1 tablet by mouth 2 (two) times daily.    Historical Provider, MD  glucose blood (RELION ULTIMA TEST) test strip USE TO CHECK GLUCOSE UP TO 4 TIMES DAILY 07/21/14   Chipper Herb, MD  guaiFENesin (MUCINEX) 600 MG 12 hr tablet Take 2 tablets (1,200 mg total) by mouth 2 (two) times daily as needed for cough or to loosen phlegm. Patient not taking: Reported on 01/08/2015 12/17/13   Chesley Mires, MD  insulin aspart (NOVOLOG) 100 UNIT/ML injection Inject 0 to 8 units up to 3 times a day prior to meal per sliding scale 01/24/14    Chipper Herb, MD  insulin detemir (LEVEMIR) 100 UNIT/ML injection Inject 0.16 mLs (16 Units total) into the skin every morning. 01/24/14   Chipper Herb, MD  insulin lispro (HUMALOG KWIKPEN) 100 UNIT/ML KiwkPen Inject 0-8 Units into the skin 3 (three) times daily before meals. Per sliding scale    Historical Provider, MD  Ipratropium-Albuterol (COMBIVENT) 20-100 MCG/ACT AERS respimat Inhale 1 puff into the lungs every 6 (six) hours as needed for wheezing or shortness of breath. 01/24/14   Chipper Herb, MD  isosorbide mononitrate (IMDUR) 60 MG 24 hr tablet TAKE 1 TABLET DAILY 12/16/14   Chipper Herb, MD  metoprolol succinate (TOPROL-XL) 25 MG 24 hr tablet TAKE 1/2 TABLET DAILY 12/15/14   Chipper Herb, MD  Multiple Vitamins-Minerals (PRESERVISION/LUTEIN) CAPS Take 1 capsule by mouth daily.     Historical Provider, MD  NIFEdipine (NIFEDICAL XL) 30 MG 24 hr tablet Take 1 tablet (30 mg total) by mouth daily. PATIENT NEEDS TO CONTACT OFFICE FOR ADDITIONAL REFILLS 12/22/14   Lorretta Harp, MD  nitroGLYCERIN (NITROSTAT) 0.4 MG SL tablet Place 1 tablet (0.4 mg total) under the tongue every 5 (five) minutes as needed for chest pain. 05/09/14   Mary-Margaret Hassell Done, FNP  pantoprazole (PROTONIX) 40 MG tablet TAKE 1 TABLET TWICE A DAY 08/22/14   Mary-Margaret Hassell Done, FNP  potassium chloride SA (K-DUR,KLOR-CON) 20 MEQ tablet Take 1 tablet (20 mEq total) by mouth daily. 01/12/15   Lorretta Harp, MD  senna-docusate (SENOKOT-S) 8.6-50 MG per tablet Take 1 tablet by mouth 2 (two) times daily.    Historical Provider, MD  simethicone (MYLICON) 80 MG chewable tablet Chew 80 mg by mouth at bedtime as needed for flatulence.    Historical Provider, MD  vitamin C (ASCORBIC ACID) 500 MG tablet Take 500 mg by mouth at bedtime.     Historical Provider, MD  warfarin (COUMADIN) 5 MG tablet TAKE (1) TABLET DAILY AFTER SUPPER. 10/16/14   Chipper Herb, MD   Liver Function Tests No results for input(s): AST, ALT, ALKPHOS,  BILITOT, PROT, ALBUMIN in the last 168 hours. No results for  input(s): LIPASE, AMYLASE in the last 168 hours. CBC No results for input(s): WBC, NEUTROABS, HGB, HCT, MCV, PLT in the last 168 hours. Basic Metabolic Panel No results for input(s): NA, K, CL, CO2, GLUCOSE, BUN, CREATININE, ALB, CALCIUM, PHOS in the last 168 hours.    Filed Vitals:   01/13/15 1746  BP: 149/63  Pulse: 78  Temp: 97.8 F (36.6 C)  TempSrc: Oral  Resp: 15  SpO2: 96%   Exam: Drowsy but easily arouses, O x3 No rash, cyanosis or gangrene Sclera anicteric, throat clear No jvd, +bilat carotid bruits Chest clear bilat no rales or wheezing RRR 2/6 SEM no RG Abd soft ntnd no mass or ascites obese +bs GU foley draining clear amber urine LE's no edema, wounds or ulcers Neuro sensation intact, L leg dec'd ROM d/t fracture  7/18 labs Na 140 K 4.0  CO2 29 BUN 24  Creat 0.98 eGFR 54  Ca 9 7/18 labs WBC 9k  Hb 10.4  plt 213 EColi urine culture 7/14 S rocephin, rec'd IV Rocephin 7/15 - 7/18 INR 7/19 (today)-  4.5  Assessment: 1. Fall / L hip fracture - INR needs to be corrected 2. COPD on home O2 3. CAD - cath 2006 w occluded RCA, L> R collaterals, otherwise noncritical CAD, had EF 25-30% at the time but since improved to normal. Cath 2011 showed no change in anatomy. F/B Dr Gwenlyn Found.  4. Chronic afib - INR up at [facility], coumadin held last 2 days per family. INR today was 4.5. Will repeat here.  5. DM 2 on insulin, cont detemir + SSI 6. HTN on MTP/ procardia  Plan - give vit K to correct INR, pain meds, stopping abx (had 4d IV abx) for UTI , recheck UA. Ortho notified (pt of Dr Jake Church, have d/w Dr Maureen Ralphs, they will see patient) and also Cardiology (f/b Dr Gwenlyn Found). Resp status stable. Get labs her, EKG/ CXR tonight. Pt clinically stable.    DVT Prophylaxis lovenox  Code Status: full  Family Communication: two daughters at bedside  Disposition Plan: rehab or SNF when stable postop    Aryanne Gilleland D [facility] Pager [phone]  If 7PM-7AM, please contact night-coverage [email or web address] Password [facility] 01/13/2015, 7:24 PM

## 2015-01-13 NOTE — Consult Note (Signed)
Reason for Consult:Left hip fracture Referring Physician: Dr. Candise Che [name] is an 81 y.o. female.  HPI: [name] is an 81 yo female with multiple medical problems who had been hospitalized at [facility] since late last week due to headache and mental status changes. Her daughter-in-law provided the history this evening. She states that the patient had severe confusion from medications and was clearing mentally as of yesterday. She was transferred to a new room yesterday and tried to get out of bed last evening. She apparently fell and the family was called as the patient sustained a hip fracture in the fall. She was ambulatory with a walker and lived independently at home before her recent hospitalization. Once learning about the hip fracture, the family requested transfer to [facility] as her cardiologist and Orthopaedic group ( [facility]) are here. She states that the patient has been sedate again today due to analgesics for the hip fracture but that she was able to recite, person, place and time upon arrival here.  Past Medical History  Diagnosis Date  . Hyperlipidemia   . Hypertension   . Anxiety   . GERD (gastroesophageal reflux disease)   . CAD (coronary artery disease)     Cath in 2006 showed an occluded RCA with left-to-right collaterals & otherwise noncritical CAD with EF=25-30% at that time. EF subsequently improved to normal by 2D ECHO. Cath Aug. 2011 showed unchanged anatomy. > medical therapy recommended  . Diastolic dysfunction     Grade II  . RBBB (right bundle branch block)     Chronic  . Paroxysmal atrial fibrillation   . Hypoxemic respiratory failure, chronic     uses 2.5 liters of oxygen with sleep  . COPD (chronic obstructive pulmonary disease)     GOLD 4 - PFT 06/08/10 FEV1 0.93 (62%), FEV1% 60, TLC 2.84 (69%0, DLCO 54%, +BD) On home O2.  . Secondary pulmonary hypertension   . Edema   . Angina   . Shortness of breath   . CHF (congestive heart  failure)   . Diabetes mellitus     Type II - goal A1C is 8, to avoid hypoglycemia  . Bradycardia 08/24/2011  . Carotid artery disease     Moderate, right greater than left which we following by duplex ultrasound. [place] 12/05/11 = RIght Bulb/Prox ICA: Moderate to severe amt of fibrous plaque elevating velocities w/in prox segment of ICA. Consistent w/a 50-69% diameter reduction. Left Bulb/Prox ICA: Moderate amt of fibrous plaque slightly elevating velocities w/in the prox segment of the ICA. Consistent w/a 0-49% diameter reduction.   Marland Kitchen PAF (paroxysmal atrial fibrillation)     In the past, on coumadin  . CHF (congestive heart failure)     Hospitalized 08/22/11-08/26/11 with CHF secondary to diastolic dysfunction & hospitilized in 07/2012 with a similar problem. TTE 08/23/11 = normal EF.  Marland Kitchen History of stress test 07/02/09    Mild perfusion defect seen in the Basal Inferior region(s). This is consistent with an infarct/scar.  Post-stress EF=56%. Global LV systolic function is normal.  EKG is negative for ischemia.  No significant iscemia detected. Low risk scan.  . Lower extremity edema     ECHO 07/21/12 = EF 55-60%, performed for TIA. Responding to diurectics. Continue diuresis w/ a goal dry weight of <160lb. Venous Duplex 07/27/11 = Right lower extremity: no evidence of thrombus or thrombophlebitis. Essentially normal right lower extremity venous duplex Doppler evaluation.  . Carotid bruit   . Mitral valve regurgitation  Mild to moderate by ECHO 07/21/12  . Sleep-related hypoventilation   . COPD with emphysema   . Coronary atherosclerosis of unspecified type of vessel, native or graft   . Cataract     Past Surgical History  Procedure Laterality Date  . Rotator cuff repair  07/2005    right  . Cardiac catheterization  2006 & 2011    Cath in 2006 showed an occluded RCA with left-to-right collaterals & otherwise noncritical CAD with EF=25-30% at that time. EF subsequently improved to normal by 2D ECHO.  Cath Aug. 2011 showed unchanged anatomy. > medical therapy recommended.  . Coronary angioplasty    . Coronary angioplasty with stent placement      x2  . Carotid duplex  12/05/11    Moderate, right greater than left which we following by duplex ultrasound. [place] 12/05/11 = RIght Bulb/Prox ICA: Moderate to severe amt of fibrous plaque elevating velocities w/in prox segment of ICA. Consistent w/a 50-69% diameter reduction. Left Bulb/Prox ICA: Moderate amt of fibrous plaque slightly elevating velocities w/in the prox segment of the ICA. Consistent w/a 0-49% diameter reduction.   . Venous duplex  07/27/11    Venous Duplex 07/27/11 = Right lower extremity: no evidence of thrombus or thrombophlebitis. Essentially normal right lower extremity venous duplex Doppler evaluation.  . Abdominal hysterectomy      complete  . Transvaginal tape (tvt) removal    . Eye surgery      Family History  Problem Relation Age of Onset  . Cancer Mother     Skin  . Heart disease Mother   . Emphysema Father   . Heart disease Father   . Congestive Heart Failure Father   . Heart disease Brother   . Heart disease Brother   . Early death Sister     Social History:  reports that she quit smoking about 28 years ago. Her smoking use included Cigarettes. She has a 22.5 pack-year smoking history. She has never used smokeless tobacco. She reports that she drinks alcohol. She reports that she does not use illicit drugs.  Allergies:  Allergies  Allergen Reactions  . Atorvastatin Other (See Comments)     muscle aches  . Codeine Nausea And Vomiting  . Morphine Nausea And Vomiting  . Other Other (See Comments)    OPIATES cause nausea and vomiting  . Rosuvastatin Other (See Comments)    muscle aches at high doses, held as of 12/2010 due to aches  . Iohexol Other (See Comments)     Desc: unknown reaction; allergic to iodine and contrast     Medications: I have reviewed the patient's current medications.  Results for orders  placed or performed during the hospital encounter of 01/13/15 (from the past 48 hour(s))  Glucose, capillary     Status: Abnormal   Collection Time: 01/13/15  6:30 PM  Result Value Ref Range   Glucose-Capillary 125 (H) 65 - 99 mg/dL   Comment 1 Notify RN    Comment 2 Document in Chart     No results found.  Review of Systems  Constitutional: Negative.   HENT: Positive for hearing loss.   Eyes: Negative.   Respiratory: Negative.   Cardiovascular: Negative.   Gastrointestinal: Negative.   Genitourinary: Negative.   Musculoskeletal: Positive for joint pain and falls.  Skin: Negative.   Neurological: Positive for headaches.  Endo/Heme/Allergies: Negative.   Psychiatric/Behavioral: Negative.    Blood pressure 149/63, pulse 78, temperature 97.8 F (36.6 C), temperature source Oral, resp.  rate 15, SpO2 96 %. Physical Exam  Wd female, sedated but easily arousible in NAD LLE not shortened or rotated EHL/FHL/TA/Gastroc intact 2 + DP pulse  Radiographs reviewed from [facility] on a disc show a mildly displaced left intertrochanteric femur fracture with displacement of the lesser trochanter  Assessment/Plan: Left intertrochanteric femur fracture- Patient will require operative fixation but needs medical clearance first and her coags need to normalize or near normalize prior to surgery. Will need cardiac clearance also. Would let her INR drift back to normal and not do Vitamin K as she is fairly comfortable and neurovascularly intact. I anticipate that Thursday would be the earliest that she could potentially undergo surgical treatment.  Gearlean Alf 01/13/2015, 8:48 PM

## 2015-01-14 ENCOUNTER — Inpatient Hospital Stay (HOSPITAL_COMMUNITY): Payer: Medicare Other

## 2015-01-14 DIAGNOSIS — I480 Paroxysmal atrial fibrillation: Secondary | ICD-10-CM

## 2015-01-14 DIAGNOSIS — I2510 Atherosclerotic heart disease of native coronary artery without angina pectoris: Secondary | ICD-10-CM

## 2015-01-14 DIAGNOSIS — Z01810 Encounter for preprocedural cardiovascular examination: Secondary | ICD-10-CM

## 2015-01-14 LAB — GLUCOSE, CAPILLARY
GLUCOSE-CAPILLARY: 97 mg/dL (ref 65–99)
Glucose-Capillary: 139 mg/dL — ABNORMAL HIGH (ref 65–99)
Glucose-Capillary: 110 mg/dL — ABNORMAL HIGH (ref 65–99)
Glucose-Capillary: 75 mg/dL (ref 65–99)

## 2015-01-14 LAB — PROTIME-INR
INR: 4.9 — ABNORMAL HIGH (ref 0.00–1.49)
INR: 5.65 (ref 0.00–1.49)
PROTHROMBIN TIME: 44.3 s — AB (ref 11.6–15.2)
Prothrombin Time: 48.6 seconds — ABNORMAL HIGH (ref 11.6–15.2)

## 2015-01-14 LAB — ABO/RH: ABO/RH(D): A POS

## 2015-01-14 MED ORDER — CEFAZOLIN SODIUM-DEXTROSE 2-3 GM-% IV SOLR
2.0000 g | INTRAVENOUS | Status: AC
  Administered 2015-01-15: 2 g via INTRAVENOUS

## 2015-01-14 MED ORDER — BACLOFEN 10 MG PO TABS
5.0000 mg | ORAL_TABLET | Freq: Three times a day (TID) | ORAL | Status: DC | PRN
  Administered 2015-01-20 (×2): 5 mg via ORAL
  Filled 2015-01-14 (×3): qty 1

## 2015-01-14 MED ORDER — HYDROMORPHONE HCL 1 MG/ML IJ SOLN
0.5000 mg | INTRAMUSCULAR | Status: DC | PRN
  Administered 2015-01-14 – 2015-01-21 (×10): 1 mg via INTRAVENOUS
  Filled 2015-01-14 (×10): qty 1

## 2015-01-14 MED ORDER — PHYTONADIONE 5 MG PO TABS
5.0000 mg | ORAL_TABLET | ORAL | Status: AC
  Administered 2015-01-14: 5 mg via ORAL
  Filled 2015-01-14: qty 1

## 2015-01-14 MED ORDER — LORAZEPAM 2 MG/ML IJ SOLN
0.5000 mg | Freq: Once | INTRAMUSCULAR | Status: DC
  Filled 2015-01-14: qty 1

## 2015-01-14 MED ORDER — PHYTONADIONE 5 MG PO TABS
2.5000 mg | ORAL_TABLET | Freq: Once | ORAL | Status: AC
  Administered 2015-01-14: 2.5 mg via ORAL
  Filled 2015-01-14: qty 1

## 2015-01-14 MED ORDER — LORAZEPAM 2 MG/ML IJ SOLN
0.5000 mg | Freq: Once | INTRAMUSCULAR | Status: AC
  Administered 2015-01-14: 0.5 mg via INTRAVENOUS
  Filled 2015-01-14: qty 1

## 2015-01-14 MED ORDER — PHYTONADIONE 5 MG PO TABS
5.0000 mg | ORAL_TABLET | Freq: Once | ORAL | Status: AC
  Administered 2015-01-14: 5 mg via ORAL
  Filled 2015-01-14: qty 1

## 2015-01-14 MED ORDER — GLUCERNA SHAKE PO LIQD
237.0000 mL | Freq: Two times a day (BID) | ORAL | Status: DC
  Administered 2015-01-14 – 2015-01-23 (×8): 237 mL via ORAL

## 2015-01-14 NOTE — Clinical Social Work Note (Signed)
Clinical Social Work Assessment  Patient Details  Name: [name] MRN: [id number] Date of Birth: [birth year]  Date of referral:  01/14/15               Reason for consult:  Facility Placement, Discharge Planning                Permission sought to share information with:  Facility Sport and exercise psychologist, Family Supports Permission granted to share information::  Yes, Verbal Permission Granted  Name::     [name]  Agency::  [employer]  Relationship::     Contact Information:     Housing/Transportation Living arrangements for the past 2 months:  [place] of Information:  Adult Children Patient Interpreter Needed:  None Criminal Activity/Legal Involvement Pertinent to Current Situation/Hospitalization:  No - Comment as needed Significant Relationships:  Adult Children Lives with:  Self Do you feel safe going back to the place where you live?  Yes Need for family participation in patient care:  Yes (Comment)  Care giving concerns:  Patient's family is very frustrated by the care the patient received at [facility] in [place]. Family would like the patient to go to [facility] at discharge.   Social Worker assessment / plan:  CSW met with patient and patient's daughter [name] at bedside. [name] explains the events that led up to her admission to [facility]. The patient experienced a fall while inpatient at [facility]. She was brought to [facility] for surgery to repair her hip. [name] states that the patient lives alone and has been doing well on her own. She was originally admitted to [facility] for UTI. [name] states that the patient will want to go to [facility] at discharge. CSW explained SNF search/placement process and answered Karen's questions. [name] would also like to complete advanced directive and HPOA documentation with patient. CSW will assist with this. CSW will follow up with bed offers.   Employment status:  Retired Programmer, applications PT Recommendations:  Not assessed at this time [place] / Referral to community resources:  [place]  Patient/Family's Response to care:  Patient's daughter [name] seems impressed by the care the patient is receiving here at [facility].  Patient/Family's Understanding of and Emotional Response to Diagnosis, Current Treatment, and Prognosis:  [name] appears to have good insight into reason for admission, diagnosis, and the patient's post DC needs.   Emotional Assessment Appearance:  Appears stated age Attitude/Demeanor/Rapport:  Unable to Assess (Patient currently sleeping) Affect (typically observed):  Unable to Assess (Patient currently sleeping) Orientation:  Oriented to Self, Oriented to Place, Oriented to  Time, Oriented to Situation Alcohol / Substance use:  Tobacco Use, Alcohol Use (Hx of) Psych involvement (Current and /or in the community):  No (Comment)  Discharge Needs  Concerns to be addressed:  Discharge Planning Concerns Readmission within the last 30 days:  No Current discharge risk:  Physical Impairment Barriers to Discharge:  Continued Medical Work up   [employer] MSW, [place], [place], [phone]

## 2015-01-14 NOTE — Clinical Social Work Note (Signed)
Advance directive completed and notarized with family. Patient awake, alert, and oriented x4. Copy placed in chart and copies given to family.   Liz Beach MSW, [place], [place], [phone]

## 2015-01-14 NOTE — Progress Notes (Signed)
CRITICAL VALUE ALERT  Critical value received:  INR = 5.65  Date of notification:  01/14/2015  Time of notification:  1722  Critical value read back:Yes.    Nurse who received alert:  Alphonsus Sias  MD notified (1st page):  Dr. Allyson Sabal  Time of first page:  1723  MD notified (2nd page):  Time of second page:  Responding MD:  Dr. Allyson Sabal  Time MD responded:  [phone]

## 2015-01-14 NOTE — Progress Notes (Addendum)
Initial Nutrition Assessment  DOCUMENTATION CODES:   Not applicable  INTERVENTION:   Glucerna Shake po BID, each supplement provides 220 kcal and 10 grams of protein  NUTRITION DIAGNOSIS:   Increased nutrient needs related to chronic illness (hip fracture) as evidenced by estimated needs  GOAL:   Patient will meet greater than or equal to 90% of their needs  MONITOR:   PO intake, Supplement acceptance, Labs, Weight trends, I & O's  REASON FOR ASSESSMENT:   Consult Hip fracture protocol  ASSESSMENT:  81 y.o. Female with a history of CAD, CHF, PVD, DM2, COPD on home O2, anxiety, former smoker and DM who was admitted to [facility] on 7/14 for headaches and neck pain. Head CT showed old lacunar infarct. She became confused/ delirium in the hospital. She had a fell 7/18 and broke her L hip (intertrochanteric fx). She was transferred to [facility] for ortho eval and surgery.  Pt sleeping upon RD visit.  Did not wake.  No family at bedside.  Orthopedic note reviewed.  Surgery possibly Thursday.  Increased nutrient needs given hip fracture, surgical intervention pending.  Would benefit from oral nutrition supplements.  RD to order.  Limited Nutrition Focused Physical Exam completed.  No muscle or subcutaneous fat depletion noticed.  Diet Order:  Diet heart healthy/carb modified Room service appropriate?: Yes; Fluid consistency:: Thin  Skin:  Reviewed, no issues  Last BM:  7/17  Height:   Ht Readings from Last 1 Encounters:  01/05/15 5\' 1"  (1.549 m)    Weight:   Wt Readings from Last 1 Encounters:  01/14/15 151 lb 11.2 oz (68.811 kg)    Ideal Body Weight:  47.7 kg  Wt Readings from Last 10 Encounters:  01/14/15 151 lb 11.2 oz (68.811 kg)  01/05/15 145 lb 8 oz (65.998 kg)  11/04/14 144 lb (65.318 kg)  07/22/14 143 lb 12.8 oz (65.227 kg)  06/12/14 148 lb (67.132 kg)  06/10/14 145 lb 6.4 oz (65.953 kg)  01/28/14 149 lb (67.586 kg)  01/10/14 149 lb (67.586 kg)   01/07/14 152 lb 3.2 oz (69.037 kg)  01/03/14 153 lb (69.4 kg)    BMI:  Body mass index is 28.68 kg/(m^2).  Estimated Nutritional Needs:   Kcal:  1500-1700  Protein:  75-85 gm  Fluid:  1.5-1.7 L  EDUCATION NEEDS:   No education needs identified at this time  Arthur Holms, RD, LDN Pager #: [phone] After-Hours Pager #: [phone]

## 2015-01-14 NOTE — Progress Notes (Signed)
Utilization review completed.  

## 2015-01-14 NOTE — Progress Notes (Signed)
Patient went for a MRI. RN was called by MRI to ask if the patient can have something to calm her. RN called MD and MD gave an order for Ativan 0.5 mg IV once. RN called back MRI and the staff said patient is OK and she doesn't need any medication right now. After 7PM during report, RN was called by MRI and requested Ativan for patient but because RN staff was in report, RN asked for few minutes to finish report. After report, RN called MRI but staff said patient had already done MRI.

## 2015-01-14 NOTE — Consult Note (Signed)
CARDIOLOGY CONSULT NOTE   Patient ID: [name] MRN: [id number] DOB/AGE: 03-02-1928 81 y.o.  Admit date: 01/13/2015  Primary Physician   Redge Gainer, MD Primary Cardiologist   Lorretta Harp MD Reason for Consultation  Pre op clearance for Left intertrochanteric femur fracture  HPI: [name] is a 81 y.o. female with a history of CAD, diastolic CHF, chronic RBBB, Paroxysmal AF (on coumadin and low dose amiodarone), PVD (moderate carotid and LSCA disease),  DM2, HTN, HL,COPD on home O2, anxiety, former smoker and DM who was admitted to [facility] on 7/14 for headaches and neck pain. W/U showed EColi UTI and pt received IV Rocephin. Head CT showed old lacunar infarct. She became confused/ delirium in the hospital. She had a fell 7/18 and broke her L hip (intertrochanteric fx). She was transferred to [facility] for ortho eval and surgery. She was ambulatory with a walker and lived independently at home before her recent hospitalization. She denies any chest pain or palpitation.   Heart cath in 2006 showed an occluded RCA with left-to-right collaterals & otherwise noncritical CAD with EF=25-30% at that time. Her last cath was performed by Dr. Ellouise Newer 01/25/2010 revealed a chronically totally occluded dominant RCA, 50% proximal circumflex with left to right collaterals and normal LV function > medical therapy recommended.  Most recent echo 12/2013 showed LV EF of 55-60%, mild LVH, interobasal akinesis, grade 2 DD, moderate LAE and mild MR. She was doing well on cardiac stand point of view when last seen by Dr. Gwenlyn Found 11/2013.   Past Medical History  Diagnosis Date  . Hyperlipidemia   . Hypertension   . Anxiety   . GERD (gastroesophageal reflux disease)   . CAD (coronary artery disease)     Cath in 2006 showed an occluded RCA with left-to-right collaterals & otherwise noncritical CAD with EF=25-30% at that time. EF subsequently improved to normal by 2D ECHO. Cath Aug. 2011 showed  unchanged anatomy. > medical therapy recommended  . Diastolic dysfunction     Grade II  . RBBB (right bundle branch block)     Chronic  . Hypoxemic respiratory failure, chronic     uses 2.5 liters of oxygen with sleep  . COPD (chronic obstructive pulmonary disease)     GOLD 4 - PFT 06/08/10 FEV1 0.93 (62%), FEV1% 60, TLC 2.84 (69%0, DLCO 54%, +BD) On home O2.  . Secondary pulmonary hypertension   . Edema   . Angina   . Shortness of breath   . Bradycardia 08/24/2011  . Carotid artery disease     Moderate, right greater than left which we following by duplex ultrasound. [place] 12/05/11 = RIght Bulb/Prox ICA: Moderate to severe amt of fibrous plaque elevating velocities w/in prox segment of ICA. Consistent w/a 50-69% diameter reduction. Left Bulb/Prox ICA: Moderate amt of fibrous plaque slightly elevating velocities w/in the prox segment of the ICA. Consistent w/a 0-49% diameter reduction.   Marland Kitchen PAF (paroxysmal atrial fibrillation)     In the past, on coumadin  . History of stress test 07/02/09    Mild perfusion defect seen in the Basal Inferior region(s). This is consistent with an infarct/scar.  Post-stress EF=56%. Global LV systolic function is normal.  EKG is negative for ischemia.  No significant iscemia detected. Low risk scan.  . Lower extremity edema     ECHO 07/21/12 = EF 55-60%, performed for TIA. Responding to diurectics. Continue diuresis w/ a goal dry weight of <160lb. Venous Duplex 07/27/11 = Right lower  extremity: no evidence of thrombus or thrombophlebitis. Essentially normal right lower extremity venous duplex Doppler evaluation.  . Carotid bruit   . Mitral valve regurgitation     Mild to moderate by ECHO 07/21/12  . Sleep-related hypoventilation   . COPD with emphysema   . Coronary atherosclerosis of unspecified type of vessel, native or graft   . PONV (postoperative nausea and vomiting)   . Family history of adverse reaction to anesthesia     "daughter gets PONV"  . CHF (congestive  heart failure)     Hospitalized 08/22/11-08/26/11 with CHF secondary to diastolic dysfunction & hospitilized in 07/2012 with a similar problem. TTE 08/23/11 = normal EF.  Marland Kitchen Myocardial infarction     "she's had several" (01/13/2015)  . On home oxygen therapy     "?L; prn" (01/13/2015)  . Pneumonia ~ 2013  . Type II diabetes mellitus     goal A1C is 8, to avoid hypoglycemia  . Sleep apnea   . Stroke     "/CT scan; ~ 2014; didn't even know she'd had it"  (01/13/2015)  . Arthritis     "all over"  . Chronic lower back pain   . Bleeds easily   . Fall during current hospitalization     "was at [facility]; fell; transferred to [facility]" (01/13/2015)     Past Surgical History  Procedure Laterality Date  . Shoulder open rotator cuff repair Right 07/2005  . Cardiac catheterization  2006 & 2011    Cath in 2006 showed an occluded RCA with left-to-right collaterals & otherwise noncritical CAD with EF=25-30% at that time. EF subsequently improved to normal by 2D ECHO. Cath Aug. 2011 showed unchanged anatomy. > medical therapy recommended.  . Coronary angioplasty    . Coronary angioplasty with stent placement      x2  . Carotid duplex  12/05/11    Moderate, right greater than left which we following by duplex ultrasound. [place] 12/05/11 = RIght Bulb/Prox ICA: Moderate to severe amt of fibrous plaque elevating velocities w/in prox segment of ICA. Consistent w/a 50-69% diameter reduction. Left Bulb/Prox ICA: Moderate amt of fibrous plaque slightly elevating velocities w/in the prox segment of the ICA. Consistent w/a 0-49% diameter reduction.   . Venous duplex  07/27/11    Venous Duplex 07/27/11 = Right lower extremity: no evidence of thrombus or thrombophlebitis. Essentially normal right lower extremity venous duplex Doppler evaluation.  . Transvaginal tape (tvt) removal    . Cataract extraction w/ intraocular lens  implant, bilateral Bilateral   . Tonsillectomy    . Total abdominal hysterectomy  ~ 1967    Allergies    Allergen Reactions  . Atorvastatin Other (See Comments)     muscle aches  . Codeine Nausea And Vomiting  . Morphine Nausea And Vomiting  . Other Other (See Comments)    OPIATES cause nausea and vomiting  . Rosuvastatin Other (See Comments)    muscle aches at high doses, held as of 12/2010 due to aches  . Iohexol Other (See Comments)     Desc: unknown reaction; allergic to iodine and contrast     I have reviewed the patient's current medications . amiodarone  100 mg Oral Daily  . aspirin  81 mg Oral Daily  . atorvastatin  40 mg Oral q1800  . budesonide-formoterol  1 puff Inhalation BID  . cholecalciferol  1,000 Units Oral Daily  . fluticasone  1 spray Each Nare BID  . furosemide  40 mg Oral Daily  .  insulin aspart  0-5 Units Subcutaneous QHS  . insulin aspart  0-9 Units Subcutaneous TID WC  . insulin detemir  16 Units Subcutaneous q morning - 10a  . isosorbide mononitrate  60 mg Oral Daily  . metoprolol succinate  12.5 mg Oral Daily  . NIFEdipine  30 mg Oral Daily  . pantoprazole  40 mg Oral BID  . phytonadione  2.5 mg Oral Once  . potassium chloride SA  20 mEq Oral Daily  . senna-docusate  1 tablet Oral BID  . vitamin C  500 mg Oral QHS   . sodium chloride 10 mL/hr at 01/13/15 2029   azelastine, HYDROmorphone (DILAUDID) injection, ipratropium-albuterol, polyethylene glycol  Prior to Admission medications   Medication Sig Start Date End Date Taking? Authorizing Provider  ALPRAZolam Duanne Moron) 0.25 MG tablet TAKE 1/2 TO 1 TABLET 2 TIMES A DAY AS NEEDED FOR ANXIETY 06/30/14   Chipper Herb, MD  amiodarone (PACERONE) 200 MG tablet Take 0.5 tablets (100 mg total) by mouth daily. Please keep upcoming appointment 12/30/14   Lorretta Harp, MD  aspirin 81 MG chewable tablet Chew 81 mg by mouth daily.    Historical Provider, MD  atorvastatin (LIPITOR) 40 MG tablet Take 1 tablet (40 mg total) by mouth every evening. 11/18/14   Lorretta Harp, MD  Azelastine HCl 0.15 % SOLN Place 1  spray into both nostrils 2 (two) times daily as needed (congestion).    Historical Provider, MD  budesonide-formoterol (SYMBICORT) 160-4.5 MCG/ACT inhaler Inhale 1 puff into the lungs 2 (two) times daily. 01/24/14   Chipper Herb, MD  Cholecalciferol (VITAMIN D PO) Take 1 capsule by mouth daily.     Historical Provider, MD  fluticasone (FLONASE) 50 MCG/ACT nasal spray Place 1 spray into both nostrils 2 (two) times daily. 12/24/13   Chesley Mires, MD  furosemide (LASIX) 40 MG tablet Take 1 tablet (40 mg total) by mouth daily. May take extra dose if weight increases 3.5 pounds in 24 hrs as needed for fluid and edema. 09/11/14   Lorretta Harp, MD  glucosamine-chondroitin 500-400 MG tablet Take 1 tablet by mouth 2 (two) times daily.    Historical Provider, MD  glucose blood (RELION ULTIMA TEST) test strip USE TO CHECK GLUCOSE UP TO 4 TIMES DAILY 07/21/14   Chipper Herb, MD  guaiFENesin (MUCINEX) 600 MG 12 hr tablet Take 2 tablets (1,200 mg total) by mouth 2 (two) times daily as needed for cough or to loosen phlegm. Patient not taking: Reported on 01/08/2015 12/17/13   Chesley Mires, MD  insulin aspart (NOVOLOG) 100 UNIT/ML injection Inject 0 to 8 units up to 3 times a day prior to meal per sliding scale 01/24/14   Chipper Herb, MD  insulin detemir (LEVEMIR) 100 UNIT/ML injection Inject 0.16 mLs (16 Units total) into the skin every morning. 01/24/14   Chipper Herb, MD  insulin lispro (HUMALOG KWIKPEN) 100 UNIT/ML KiwkPen Inject 0-8 Units into the skin 3 (three) times daily before meals. Per sliding scale    Historical Provider, MD  Ipratropium-Albuterol (COMBIVENT) 20-100 MCG/ACT AERS respimat Inhale 1 puff into the lungs every 6 (six) hours as needed for wheezing or shortness of breath. 01/24/14   Chipper Herb, MD  isosorbide mononitrate (IMDUR) 60 MG 24 hr tablet TAKE 1 TABLET DAILY 12/16/14   Chipper Herb, MD  metoprolol succinate (TOPROL-XL) 25 MG 24 hr tablet TAKE 1/2 TABLET DAILY 12/15/14   Chipper Herb, MD  Multiple  Vitamins-Minerals (PRESERVISION/LUTEIN) CAPS Take 1 capsule by mouth daily.     Historical Provider, MD  NIFEdipine (NIFEDICAL XL) 30 MG 24 hr tablet Take 1 tablet (30 mg total) by mouth daily. PATIENT NEEDS TO CONTACT OFFICE FOR ADDITIONAL REFILLS 12/22/14   Lorretta Harp, MD  nitroGLYCERIN (NITROSTAT) 0.4 MG SL tablet Place 1 tablet (0.4 mg total) under the tongue every 5 (five) minutes as needed for chest pain. 05/09/14   Mary-Margaret Hassell Done, FNP  pantoprazole (PROTONIX) 40 MG tablet TAKE 1 TABLET TWICE A DAY 08/22/14   Mary-Margaret Hassell Done, FNP  potassium chloride SA (K-DUR,KLOR-CON) 20 MEQ tablet Take 1 tablet (20 mEq total) by mouth daily. 01/12/15   Lorretta Harp, MD  senna-docusate (SENOKOT-S) 8.6-50 MG per tablet Take 1 tablet by mouth 2 (two) times daily.    Historical Provider, MD  simethicone (MYLICON) 80 MG chewable tablet Chew 80 mg by mouth at bedtime as needed for flatulence.    Historical Provider, MD  vitamin C (ASCORBIC ACID) 500 MG tablet Take 500 mg by mouth at bedtime.     Historical Provider, MD  warfarin (COUMADIN) 5 MG tablet TAKE (1) TABLET DAILY AFTER SUPPER. 10/16/14   Chipper Herb, MD     History   Social History  . Marital Status: Widowed    Spouse Name: N/A  . Number of Children: N/A  . Years of Education: N/A   Occupational History  . Not on file.   Social History Main Topics  . Smoking status: Former Smoker -- 1.50 packs/day for 15 years    Types: Cigarettes  . Smokeless tobacco: Never Used     Comment: Smoked 1.5 packs per day for 15 years, quit in 1995.  Marland Kitchen Alcohol Use: 3.6 oz/week    6 Glasses of wine per week     Comment: 01/13/2015 "couple glasses of wine maybe 3 days/wk"  . Drug Use: No  . Sexual Activity: No   Other Topics Concern  . Not on file   Social History Narrative   Widowed, lives with daughter    Family Status  Relation Status Death Age  . Mother Deceased 55  . Father Deceased 92  . Brother Deceased 44   . Brother Deceased 87  . Sister Deceased 1.5 years   Family History  Problem Relation Age of Onset  . Cancer Mother     Skin  . Heart disease Mother   . Emphysema Father   . Heart disease Father   . Congestive Heart Failure Father   . Heart disease Brother   . Heart disease Brother   . Early death Sister      ROS:  Full 14 point review of systems complete and found to be negative unless listed above.  Physical Exam: Blood pressure 141/52, pulse 79, temperature 97.8 F (36.6 C), temperature source Oral, resp. rate 16, weight 151 lb 11.2 oz (68.811 kg), SpO2 98 %.  General: Well developed, well nourished, female in no acute distress Head: Eyes PERRLA, No xanthomas. Normocephalic and atraumatic, oropharynx without edema or exudate.  Lungs: Resp regular and unlabored, CTA. Heart: RRR no s3, s4. SEM murmurs..   Neck: bilateral carotid bruits. No lymphadenopathy.  JVD. Abdomen: Bowel sounds present, abdomen soft and non-tender without masses or hernias noted. Msk:  No spine or cva tenderness. L leg decreased ROM Extremities: No clubbing, cyanosis or edema. DP/PT/Radials 2+ and equal bilaterally. Neuro: Alert and oriented X 3. No focal deficits noted. Psych:  Good affect, responds  appropriately Skin: No rashes or lesions noted.  Labs:   Lab Results  Component Value Date   WBC 8.3 01/13/2015   HGB 11.2* 01/13/2015   HCT 36.4 01/13/2015   MCV 99.5 01/13/2015   PLT 222 01/13/2015    Recent Labs  01/14/15 0448  INR 4.90*    Recent Labs Lab 01/13/15 2119  NA 143  K 3.9  CL 104  CO2 27  BUN 27*  CREATININE 1.32*  CALCIUM 8.8*  PROT 7.0  BILITOT 0.9  ALKPHOS 71  ALT 18  AST 24  GLUCOSE 153*  ALBUMIN 2.8*   MAGNESIUM  Date Value Ref Range Status  08/22/2011 2.0 1.5 - 2.5 mg/dL Final   No results for input(s): CKTOTAL, CKMB, TROPONINI in the last 72 hours. No results for input(s): TROPIPOC in the last 72 hours. PRO B NATRIURETIC PEPTIDE (BNP)  Date/Time  Value Ref Range Status  12/30/2013 02:39 AM 508.2* 0 - 450 pg/mL Final  12/27/2013 08:46 PM 913.7* 0 - 450 pg/mL Final   Lab Results  Component Value Date   CHOL 163 11/04/2014   HDL 100 11/04/2014   LDLCALC 48 11/04/2014   TRIG 74 11/04/2014   Lab Results  Component Value Date   DDIMER  06/04/2010    <0.22        AT THE INHOUSE ESTABLISHED CUTOFF VALUE OF 0.48 ug/mL FEU, THIS ASSAY HAS BEEN DOCUMENTED IN THE LITERATURE TO HAVE A SENSITIVITY AND NEGATIVE PREDICTIVE VALUE OF AT LEAST 98 TO 99%.  THE TEST RESULT SHOULD BE CORRELATED WITH AN ASSESSMENT OF THE CLINICAL PROBABILITY OF DVT / VTE.   LIPASE  Date/Time Value Ref Range Status  01/21/2010 01:26 PM 97 23 - 300 U/L Final   TSH  Date/Time Value Ref Range Status  12/28/2013 04:10 AM 2.100 0.350 - 4.500 uIU/mL Final   VITAMIN B-12  Date/Time Value Ref Range Status  07/22/2012 10:45 AM 237 211 - 911 pg/mL Final    Echo: 12/2013  LV EF: 55% -  60%  ------------------------------------------------------------------- Indications:   CHF - 428.0. COPD 496. Dyspnea 786.09.  ------------------------------------------------------------------- History:  PMH:  Coronary artery disease. Risk factors: Hypertension. Diabetes mellitus. Dyslipidemia.  ------------------------------------------------------------------- Study Conclusions  - Left ventricle: The cavity size was normal. Wall thickness was increased in a pattern of mild LVH. Systolic function was normal. The estimated ejection fraction was in the range of 55% to 60%. There is akinesis of the basalinferior myocardium. Features are consistent with a pseudonormal left ventricular filling pattern, with concomitant abnormal relaxation and increased filling pressure (grade 2 diastolic dysfunction). - Aortic valve: Cusp separation was mildly reduced. There was trivial regurgitation. - Mitral valve: There was mild regurgitation. - Left atrium:  The atrium was moderately dilated.  Impressions:  - Normal LV function with inferobasal akinesis; grade 2 diastolic dysfunction; moderate LAE; mild MR.  ECG:  NSR  At rate of 85 with 1st degree AV block, RBBB, TWI in all leads except I, aVR and aVL  Radiology:  Chest Portable 1 View  01/13/2015   CLINICAL DATA:  Preop for left femur fracture  EXAM: PORTABLE CHEST - 1 VIEW  COMPARISON:  01/12/2015  FINDINGS: Moderate to severe cardiac enlargement. Calcified aortic arch. Mild vascular congestion. No evidence of consolidation or edema. Minimal right lower lobe scarring or atelectasis.  IMPRESSION: No acute findings. Stable cardiac enlargement with vascular congestion but no pulmonary edema.   Electronically Signed   By: Skipper Cliche M.D.   On: 01/13/2015 21:50  ASSESSMENT AND PLAN:    Active Problems:   PAF, recurrance this admission, on chronic Amio/ Coumadin   COPD with emphysema   Chronic respiratory failure with hypoxia   HTN (hypertension)   CAD, remote RCA PCI, subsequently noted to be occluded, last cath 8/11   DM type 2 causing CKD stage 3   Hip fracture  1. PAF - at home on coumadin and amio 100mg  daily - CHADSVASc score of at least 7 (age, sex, CHF, HTN, vascular disease, DM), 9 if consider old stroke on CT of head - INR of 4.90. Coumadin held for the past 3 days per IM note. Continue Amiodarone. Tentative surgery Thursday. Consider Vitamin K. - Continue Toprol XL 12.5mg  and Procardia 30mg  tab  2. CAD, remote RCA PCI -  Cath 01/25/2010 revealed a chronically totally occluded dominant RCA, 50% proximal circumflex with left to right collaterals and normal LV function - EKG with new TWI in lead V5 and V6, could be due to RBBB. No recent anginal pain. - Continue ASA, BB, statin, imdur  3. PVD (peripheral vascular disease), moderate carotid and LSCA disease - moderate right and mild left ICA stenosis followed by duplex ultrasound on a serial basis. - stable disease on  recent carotid doppler 03/2014. Repeat study in 1 year.   4. Chronic diastolic CHF - No sign of volume overload.  - Most recent echo 12/2013 showed LV EF of 55-60%, mild LVH, interobasal akinesis, grade 2 DD, moderate LAE and mild MR. Continue current regimen.  - CXR showed No acute findings. Stable cardiac enlargement with vascular congestion but no pulmonary edema.  5. Fall/L hip fracture - Per orthopedic surgery, need to correct INR prior to surgery. She is at risk surgery given multiple medical problem.   6. HTN - stable, continue current regimen.   SignedLeanor Kail, PA 01/14/2015, 9:33 AM Pager [phone]  Co-Sign MD As above, patient seen and examined. Briefly she is an 81 year old female with a past medical history of coronary artery disease, diastolic congestive heart failure, paroxysmal atrial fibrillation, peripheral vascular disease, home O2 dependent COPD, diabetes, hypertension and hyperlipidemia who I am asked to evaluate preoperatively prior to repair of left hip fracture. Patient has chronic dyspnea on exertion and requires O2. There is no orthopnea or PND. She typically does not have pedal edema. She has had occasional rare chest pain but typically does not have chest pain with exertion. Last cardiac catheterization in August 2011 revealed a chronically occluded right coronary artery, 50% circumflex and left to right collaterals. Ejection fraction normal. Last echocardiogram July 2015 showed preserved LV function, grade 2 diastolic dysfunction and mild mitral regurgitation. Patient recently admitted to [facility] with complaints of headache and neck pain. She was also treated for a urinary tract infection. She developed confusion and then fell fracturing her left hip. She was transferred for repair. Electrocardiogram shows sinus rhythm, first-degree AV block, right bundle branch block and inferior/lateral T-wave inversion. Prior inferior infarct cannot be excluded.    Given patient's age, baseline coronary disease, home O2 dependent COPD she will be at increased risk for surgery. However she has not had any recent chest pain and last cardiac catheterization in 2011 as outlined above. Her surgery would not be considered a high risk surgery. I would therefore proceed without further cardiac evaluation. I discussed her increased risk with the patient and her daughter. We will continue preadmission medications. Continue preadmission dose of Lasix and follow for volume excess postoperatively. Note patient does have  a history of paroxysmal atrial fibrillation. She is in sinus rhythm. Continue preadmission dose of amiodarone. Resume Coumadin postoperatively. Kirk Ruths

## 2015-01-14 NOTE — Care Management Note (Addendum)
Case Management Note  Patient Details  Name: [name] MRN: [id number] Date of Birth: July 09, 1927  Subjective/Objective:   Patient has hip fx , for surgery today, Patient will likely need snf at dc.  CSW aware, states family would like [facility] if patient goes to snf.  PT/OT will be ordered after surgery.               Action/Plan:   Expected Discharge Date:                  Expected Discharge Plan:  Skilled Nursing Facility  In-House Referral:  Clinical Social Work  Discharge planning Services  CM Consult  Post Acute Care Choice:    Choice offered to:     DME Arranged:    DME Agency:     HH Arranged:    [place] Agency:     Status of Service:  In process, will continue to follow  Medicare Important Message Given:    Date Medicare IM Given:    Medicare IM give by:    Date Additional Medicare IM Given:    Additional Medicare Important Message give by:     If discussed at [place] of Stay Meetings, dates discussed:    Additional Comments:  Zenon Mayo, RN 01/14/2015, 11:18 AM

## 2015-01-14 NOTE — Progress Notes (Signed)
ANTICOAGULATION CONSULT NOTE - Initial Consult  Pharmacy Consult for Warfarin (on hold) Indication: atrial fibrillation  Allergies  Allergen Reactions  . Atorvastatin Other (See Comments)     muscle aches  . Codeine Nausea And Vomiting  . Morphine Nausea And Vomiting  . Other Other (See Comments)    OPIATES cause nausea and vomiting  . Rosuvastatin Other (See Comments)    muscle aches at high doses, held as of 12/2010 due to aches  . Iohexol Other (See Comments)     Desc: unknown reaction; allergic to iodine and contrast     Patient Measurements: Weight: 151 lb 11.2 oz (68.811 kg)   Vital Signs: Temp: 97.8 F (36.6 C) (07/20 0407) Temp Source: Oral (07/20 0407) BP: 141/52 mmHg (07/20 0407) Pulse Rate: 79 (07/20 0407)  Labs:  Recent Labs  01/13/15 2119 01/14/15 0448  HGB 11.2*  --   HCT 36.4  --   PLT 222  --   LABPROT 39.7* 44.3*  INR 4.23* 4.90*  CREATININE 1.32*  --     Estimated Creatinine Clearance: 28.1 mL/min (by C-G formula based on Cr of 1.32).   Medical History: Past Medical History  Diagnosis Date  . Hyperlipidemia   . Hypertension   . Anxiety   . GERD (gastroesophageal reflux disease)   . CAD (coronary artery disease)     Cath in 2006 showed an occluded RCA with left-to-right collaterals & otherwise noncritical CAD with EF=25-30% at that time. EF subsequently improved to normal by 2D ECHO. Cath Aug. 2011 showed unchanged anatomy. > medical therapy recommended  . Diastolic dysfunction     Grade II  . RBBB (right bundle branch block)     Chronic  . Hypoxemic respiratory failure, chronic     uses 2.5 liters of oxygen with sleep  . COPD (chronic obstructive pulmonary disease)     GOLD 4 - PFT 06/08/10 FEV1 0.93 (62%), FEV1% 60, TLC 2.84 (69%0, DLCO 54%, +BD) On home O2.  . Secondary pulmonary hypertension   . Edema   . Angina   . Shortness of breath   . Bradycardia 08/24/2011  . Carotid artery disease     Moderate, right greater than left  which we following by duplex ultrasound. [place] 12/05/11 = RIght Bulb/Prox ICA: Moderate to severe amt of fibrous plaque elevating velocities w/in prox segment of ICA. Consistent w/a 50-69% diameter reduction. Left Bulb/Prox ICA: Moderate amt of fibrous plaque slightly elevating velocities w/in the prox segment of the ICA. Consistent w/a 0-49% diameter reduction.   Marland Kitchen PAF (paroxysmal atrial fibrillation)     In the past, on coumadin  . History of stress test 07/02/09    Mild perfusion defect seen in the Basal Inferior region(s). This is consistent with an infarct/scar.  Post-stress EF=56%. Global LV systolic function is normal.  EKG is negative for ischemia.  No significant iscemia detected. Low risk scan.  . Lower extremity edema     ECHO 07/21/12 = EF 55-60%, performed for TIA. Responding to diurectics. Continue diuresis w/ a goal dry weight of <160lb. Venous Duplex 07/27/11 = Right lower extremity: no evidence of thrombus or thrombophlebitis. Essentially normal right lower extremity venous duplex Doppler evaluation.  . Carotid bruit   . Mitral valve regurgitation     Mild to moderate by ECHO 07/21/12  . Sleep-related hypoventilation   . COPD with emphysema   . Coronary atherosclerosis of unspecified type of vessel, native or graft   . PONV (postoperative nausea and vomiting)   .  Family history of adverse reaction to anesthesia     "daughter gets PONV"  . CHF (congestive heart failure)     Hospitalized 08/22/11-08/26/11 with CHF secondary to diastolic dysfunction & hospitilized in 07/2012 with a similar problem. TTE 08/23/11 = normal EF.  Marland Kitchen Myocardial infarction     "she's had several" (01/13/2015)  . On home oxygen therapy     "?L; prn" (01/13/2015)  . Pneumonia ~ 2013  . Type II diabetes mellitus     goal A1C is 8, to avoid hypoglycemia  . Sleep apnea   . Stroke     "/CT scan; ~ 2014; didn't even know she'd had it"  (01/13/2015)  . Arthritis     "all over"  . Chronic lower back pain   . Bleeds easily    . Fall during current hospitalization     "was at [facility]; fell; transferred to [facility]" (01/13/2015)    Medications:  Prescriptions prior to admission  Medication Sig Dispense Refill Last Dose  . ALPRAZolam (XANAX) 0.25 MG tablet TAKE 1/2 TO 1 TABLET 2 TIMES A DAY AS NEEDED FOR ANXIETY 30 tablet 2 Taking  . amiodarone (PACERONE) 200 MG tablet Take 0.5 tablets (100 mg total) by mouth daily. Please keep upcoming appointment 30 tablet 0 Taking  . aspirin 81 MG chewable tablet Chew 81 mg by mouth daily.   Taking  . atorvastatin (LIPITOR) 40 MG tablet Take 1 tablet (40 mg total) by mouth every evening. 30 tablet 8 Taking  . Azelastine HCl 0.15 % SOLN Place 1 spray into both nostrils 2 (two) times daily as needed (congestion).   Taking  . budesonide-formoterol (SYMBICORT) 160-4.5 MCG/ACT inhaler Inhale 1 puff into the lungs 2 (two) times daily. 3 Inhaler 2 Taking  . Cholecalciferol (VITAMIN D PO) Take 1 capsule by mouth daily.    Taking  . fluticasone (FLONASE) 50 MCG/ACT nasal spray Place 1 spray into both nostrils 2 (two) times daily. 16 g 6 Taking  . furosemide (LASIX) 40 MG tablet Take 1 tablet (40 mg total) by mouth daily. May take extra dose if weight increases 3.5 pounds in 24 hrs as needed for fluid and edema. 60 tablet 4 Taking  . glucosamine-chondroitin 500-400 MG tablet Take 1 tablet by mouth 2 (two) times daily.   Taking  . glucose blood (RELION ULTIMA TEST) test strip USE TO CHECK GLUCOSE UP TO 4 TIMES DAILY 200 each 1 Taking  . guaiFENesin (MUCINEX) 600 MG 12 hr tablet Take 2 tablets (1,200 mg total) by mouth 2 (two) times daily as needed for cough or to loosen phlegm. (Patient not taking: Reported on 01/08/2015)   Not Taking  . insulin aspart (NOVOLOG) 100 UNIT/ML injection Inject 0 to 8 units up to 3 times a day prior to meal per sliding scale 20 mL 2 Taking  . insulin detemir (LEVEMIR) 100 UNIT/ML injection Inject 0.16 mLs (16 Units total) into the skin every morning. 20 mL 2 Taking   . insulin lispro (HUMALOG KWIKPEN) 100 UNIT/ML KiwkPen Inject 0-8 Units into the skin 3 (three) times daily before meals. Per sliding scale   Not Taking  . Ipratropium-Albuterol (COMBIVENT) 20-100 MCG/ACT AERS respimat Inhale 1 puff into the lungs every 6 (six) hours as needed for wheezing or shortness of breath. 3 Inhaler 2 Taking  . isosorbide mononitrate (IMDUR) 60 MG 24 hr tablet TAKE 1 TABLET DAILY 30 tablet 4 Taking  . metoprolol succinate (TOPROL-XL) 25 MG 24 hr tablet TAKE 1/2 TABLET DAILY 15  tablet 4 Taking  . Multiple Vitamins-Minerals (PRESERVISION/LUTEIN) CAPS Take 1 capsule by mouth daily.    Taking  . NIFEdipine (NIFEDICAL XL) 30 MG 24 hr tablet Take 1 tablet (30 mg total) by mouth daily. PATIENT NEEDS TO CONTACT OFFICE FOR ADDITIONAL REFILLS 30 tablet 0 Taking  . nitroGLYCERIN (NITROSTAT) 0.4 MG SL tablet Place 1 tablet (0.4 mg total) under the tongue every 5 (five) minutes as needed for chest pain. 25 tablet 1 Taking  . pantoprazole (PROTONIX) 40 MG tablet TAKE 1 TABLET TWICE A DAY 60 tablet 4 Taking  . potassium chloride SA (K-DUR,KLOR-CON) 20 MEQ tablet Take 1 tablet (20 mEq total) by mouth daily. 30 tablet 1   . senna-docusate (SENOKOT-S) 8.6-50 MG per tablet Take 1 tablet by mouth 2 (two) times daily.   Taking  . simethicone (MYLICON) 80 MG chewable tablet Chew 80 mg by mouth at bedtime as needed for flatulence.   Taking  . vitamin C (ASCORBIC ACID) 500 MG tablet Take 500 mg by mouth at bedtime.    Taking  . warfarin (COUMADIN) 5 MG tablet TAKE (1) TABLET DAILY AFTER SUPPER. 30 tablet 2 Taking    Assessment: 56 YOF transferred from [facility] after hip fracture on Coumadin pta for Afib. INR on admission was elevated at 4.23 and has now trended up to 4.9. Coumadin currently on hold for surgery. Vitamin K 2.5 mg IV ordered today to help bring down INR.   Goal of Therapy:  INR 2-3 Monitor platelets by anticoagulation protocol: Yes   Plan:  -Hold Coumadin until further MD  orders -Monitor daily PT/INR -Monitor need for heparin bridge when INR < 2 after surgery   Albertina Parr, PharmD., BCPS Clinical Pharmacist Pager [phone]

## 2015-01-14 NOTE — Progress Notes (Addendum)
[name]  [name] QJF:[id number] DOB: April 03, 1928 DOA: 01/13/2015 PCP: Redge Gainer, MD  Assessment/Plan: Active Problems:   PAF, recurrance this admission, on chronic Amio/ Coumadin   COPD with emphysema   Chronic respiratory failure with hypoxia   HTN (hypertension)   CAD, remote RCA PCI, subsequently noted to be occluded, last cath 8/11   DM type 2 causing CKD stage 3   Hip fracture   Preop cardiovascular exam    Mechanical fall  Patient now has a hip fracture, waiting on INR to correct Also complaining of a headache and neck pain, we'll check a CT head and neck to rule out intracranial bleed and fracture If negative patient will need an MRI if able to tolerate, radiology suspects  ligamentous injury of the C-spine level, therefore an MRI of the cervical spine has been ordered Dilaudid for headache and neck pain Patient became somnolent for Flexeril at [facility] Trial of baclofen to see if it relieves the headache    History of coronary artery disease, chronic diastolic congestive heart failure without exacerbation, paroxysmal atrial fibrillation, peripheral vascular disease Cardiology consulted for clearance prior to surgery EKG shows first-degree AV block, right bundle branch block Patient will be at increased risk for surgery, however no active chest pain Cardiology recommends to proceed without further cardiac evaluation Continue amiodarone for atrial fibrillation  Supratherapeutic INR Patient will be given 2.5 mg of vitamin K, repeat INR this evening, is still greater than 2. 0 repeat another 2.5 mg of vitamin K by mouth Type and screen for possible FFP administration tomorrow  Peripheral vascular disease, on aspirin at home  Diabetes continue NovoLog and Levemir  Hypertension continue home medications  Oxygen dependent COPD, continue Symbicort, when necessary nebulizer treatments      Code Status:      Code Status  Orders        Start     Ordered   01/13/15 2020  Full code   Continuous     01/13/15 2019     Family Communication: family updated about patient's clinical progress Disposition Plan:  As above    Brief narrative: [name] is a 81 y.o. female with a history of CAD, diastolic CHF, chronic RBBB, Paroxysmal AF (on coumadin and low dose amiodarone), PVD (moderate carotid and LSCA disease), DM2, HTN, HL,COPD on home O2, anxiety, former smoker and DM who was admitted to [facility] on 7/14 for headaches and neck pain. W/U showed EColi UTI and pt received IV Rocephin. Head CT showed old lacunar infarct. She became confused/ delirium in the hospital. She had a fell 7/18 and broke her L hip (intertrochanteric fx). She was transferred to [facility] for ortho eval and surgery. She was ambulatory with a walker and lived independently at home before her recent hospitalization. She denies any chest pain or palpitation.   Heart cath in 2006 showed an occluded RCA with left-to-right collaterals & otherwise noncritical CAD with EF=25-30% at that time. Her last cath was performed by Dr. Ellouise Newer 01/25/2010 revealed a chronically totally occluded dominant RCA, 50% proximal circumflex with left to right collaterals and normal LV function > medical therapy recommended.  Most recent echo 12/2013 showed LV EF of 55-60%, mild LVH, interobasal akinesis, grade 2 DD, moderate LAE and mild MR. She was doing well on cardiac stand point of view when last seen by Dr. Gwenlyn Found 11/2013.  Consultants:  Cardiology  Orthopedics    Procedures:  None  Antibiotics: Anti-infectives    None         HPI/Subjective: Complaining of head and neck pain, daughter is by the bedside, also complaining of hip pain  Objective: Filed Vitals:   01/14/15 0023 01/14/15 0407 01/14/15 0454 01/14/15 0915  BP: 118/53 141/52    Pulse: 83 79    Temp: 98.1 F (36.7 C) 97.8 F (36.6 C)    TempSrc: Oral Oral    Resp: 16 16     Weight:   68.811 kg (151 lb 11.2 oz)   SpO2: 98% 100%  98%    Intake/Output Summary (Last 24 hours) at 01/14/15 1148 Last data filed at 01/14/15 1139  Gross per 24 hour  Intake    805 ml  Output   1100 ml  Net   -295 ml    Exam:  General: Uncomfortable Lungs: Clear to auscultation bilaterally without wheezes or crackles Cardiovascular: Regular rate and rhythm without murmur gallop or rub normal S1 and S2 Abdomen: Nontender, nondistended, soft, bowel sounds positive, no rebound, no ascites, no appreciable mass Extremities: No significant cyanosis, clubbing, or edema bilateral lower extremities     Data Review   Micro Results No results found for this or any previous visit (from the past 240 hour(s)).  Radiology Reports Chest Portable 1 View  01/13/2015   CLINICAL DATA:  Preop for left femur fracture  EXAM: PORTABLE CHEST - 1 VIEW  COMPARISON:  01/12/2015  FINDINGS: Moderate to severe cardiac enlargement. Calcified aortic arch. Mild vascular congestion. No evidence of consolidation or edema. Minimal right lower lobe scarring or atelectasis.  IMPRESSION: No acute findings. Stable cardiac enlargement with vascular congestion but no pulmonary edema.   Electronically Signed   By: Skipper Cliche M.D.   On: 01/13/2015 21:50     CBC  Recent Labs Lab 01/13/15 2119  WBC 8.3  HGB 11.2*  HCT 36.4  PLT 222  MCV 99.5  MCH 30.6  MCHC 30.8  RDW 13.7    Chemistries   Recent Labs Lab 01/13/15 2119  NA 143  K 3.9  CL 104  CO2 27  GLUCOSE 153*  BUN 27*  CREATININE 1.32*  CALCIUM 8.8*  AST 24  ALT 18  ALKPHOS 71  BILITOT 0.9   ------------------------------------------------------------------------------------------------------------------ estimated creatinine clearance is 28.1 mL/min (by C-G formula based on Cr of 1.32). ------------------------------------------------------------------------------------------------------------------ No results for input(s): HGBA1C  in the last 72 hours. ------------------------------------------------------------------------------------------------------------------ No results for input(s): CHOL, HDL, LDLCALC, TRIG, CHOLHDL, LDLDIRECT in the last 72 hours. ------------------------------------------------------------------------------------------------------------------ No results for input(s): TSH, T4TOTAL, T3FREE, THYROIDAB in the last 72 hours.  Invalid input(s): FREET3 ------------------------------------------------------------------------------------------------------------------ No results for input(s): VITAMINB12, FOLATE, FERRITIN, TIBC, IRON, RETICCTPCT in the last 72 hours.  Coagulation profile  Recent Labs Lab 01/13/15 2119 01/14/15 0448  INR 4.23* 4.90*    No results for input(s): DDIMER in the last 72 hours.  Cardiac Enzymes No results for input(s): CKMB, TROPONINI, MYOGLOBIN in the last 168 hours.  Invalid input(s): CK ------------------------------------------------------------------------------------------------------------------ Invalid input(s): POCBNP   CBG:  Recent Labs Lab 01/13/15 1830 01/13/15 2114 01/13/15 2333 01/14/15 0742  GLUCAP 125* 169* 159* 139*       Studies: Chest Portable 1 View  01/13/2015   CLINICAL DATA:  Preop for left femur fracture  EXAM: PORTABLE CHEST - 1 VIEW  COMPARISON:  01/12/2015  FINDINGS: Moderate to severe cardiac enlargement. Calcified aortic arch. Mild vascular congestion. No evidence of consolidation or edema. Minimal right lower lobe scarring or atelectasis.  IMPRESSION:  No acute findings. Stable cardiac enlargement with vascular congestion but no pulmonary edema.   Electronically Signed   By: Skipper Cliche M.D.   On: 01/13/2015 21:50      Lab Results  Component Value Date   HGBA1C 7.3 11/04/2014   HGBA1C 7..2% 06/12/2014   HGBA1C 7.0* 12/28/2013   Lab Results  Component Value Date   LDLCALC 48 11/04/2014   CREATININE 1.32*  01/13/2015       Scheduled Meds: . amiodarone  100 mg Oral Daily  . aspirin  81 mg Oral Daily  . atorvastatin  40 mg Oral q1800  . budesonide-formoterol  1 puff Inhalation BID  . cholecalciferol  1,000 Units Oral Daily  . fluticasone  1 spray Each Nare BID  . furosemide  40 mg Oral Daily  . insulin aspart  0-5 Units Subcutaneous QHS  . insulin aspart  0-9 Units Subcutaneous TID WC  . insulin detemir  16 Units Subcutaneous q morning - 10a  . isosorbide mononitrate  60 mg Oral Daily  . metoprolol succinate  12.5 mg Oral Daily  . NIFEdipine  30 mg Oral Daily  . pantoprazole  40 mg Oral BID  . potassium chloride SA  20 mEq Oral Daily  . senna-docusate  1 tablet Oral BID  . vitamin C  500 mg Oral QHS   Continuous Infusions: . sodium chloride 10 mL/hr at 01/13/15 2029    Active Problems:   PAF, recurrance this admission, on chronic Amio/ Coumadin   COPD with emphysema   Chronic respiratory failure with hypoxia   HTN (hypertension)   CAD, remote RCA PCI, subsequently noted to be occluded, last cath 8/11   DM type 2 causing CKD stage 3   Hip fracture   Preop cardiovascular exam    Time spent: 48 minutes   [facility] Pager [phone]. If 7PM-7AM, please contact night-coverage at [email or web address], password [facility] 01/14/2015, 11:48 AM  LOS: 1 day

## 2015-01-14 NOTE — H&P (Signed)
Melinda Jaffe, MD Physician Addendum Nephrology H&P 01/13/2015 7:24 PM     [facility] History and Physical  [name] ZOX:[id number] DOB: 09-Jun-1928 DOA: 01/13/2015  Referring physician: Dr Sherril Cong PCP: Melinda Gainer, MD  Chief Complaint: left hip fracture  HPI: [name] is a 81 y.o. female with hx of DM2, HTN, anxiety, CAD (as below), COPD on home O2, afib, CHF (EF 25% improved to normal) who was admitted to [facility] on 7/14 for headaches and neck pain. W/U showed EColi UTI and pt rec'd 4 days of IV Rocephin. Head CT showed old lacunar infarct. She became confused/ delirium in the hospital. She got up last night , fell and broke her L hip (intertrochanteric fx). She was transferred to [facility] for ortho eval and surgery. Mental status has improved and is oriented x 3. Family provides most hx.   No recent CP, SOB, cough, fevers. No recent n/v/d.   Heart cath in 2006 showed an occluded RCA with left-to-right collaterals & otherwise noncritical CAD with EF=25-30% at that time. EF subsequently improved to normal by 2D ECHO. Cath Aug. 2011 showed unchanged anatomy. > medical therapy recommended Patient lives home alone, cooks and cleans prior to admission Can patient participate in ADLs? yes  Past Medical History  Past Medical History  Diagnosis Date  . Hyperlipidemia   . Hypertension   . Anxiety   . GERD (gastroesophageal reflux disease)   . CAD (coronary artery disease)     Cath in 2006 showed an occluded RCA with left-to-right collaterals & otherwise noncritical CAD with EF=25-30% at that time. EF subsequently improved to normal by 2D ECHO. Cath Aug. 2011 showed unchanged anatomy. > medical therapy recommended  . Diastolic dysfunction     Grade II  . RBBB (right bundle branch block)     Chronic  . Paroxysmal atrial fibrillation   . Hypoxemic respiratory failure, chronic     uses 2.5 liters of oxygen with  sleep  . COPD (chronic obstructive pulmonary disease)     GOLD 4 - PFT 06/08/10 FEV1 0.93 (62%), FEV1% 60, TLC 2.84 (69%0, DLCO 54%, +BD) On home O2.  . Secondary pulmonary hypertension   . Edema   . Angina   . Shortness of breath   . CHF (congestive heart failure)   . Diabetes mellitus     Type II - goal A1C is 8, to avoid hypoglycemia  . Bradycardia 08/24/2011  . Carotid artery disease     Moderate, right greater than left which we following by duplex ultrasound. [place] 12/05/11 = RIght Bulb/Prox ICA: Moderate to severe amt of fibrous plaque elevating velocities w/in prox segment of ICA. Consistent w/a 50-69% diameter reduction. Left Bulb/Prox ICA: Moderate amt of fibrous plaque slightly elevating velocities w/in the prox segment of the ICA. Consistent w/a 0-49% diameter reduction.   Marland Kitchen PAF (paroxysmal atrial fibrillation)     In the past, on coumadin  . CHF (congestive heart failure)     Hospitalized 08/22/11-08/26/11 with CHF secondary to diastolic dysfunction & hospitilized in 07/2012 with a similar problem. TTE 08/23/11 = normal EF.  Marland Kitchen History of stress test 07/02/09    Mild perfusion defect seen in the Basal Inferior region(s). This is consistent with an infarct/scar. Post-stress EF=56%. Global LV systolic function is normal. EKG is negative for ischemia. No significant iscemia detected. Low risk scan.  . Lower extremity edema     ECHO 07/21/12 = EF 55-60%, performed for TIA. Responding to diurectics. Continue diuresis w/ a  goal dry weight of <160lb. Venous Duplex 07/27/11 = Right lower extremity: no evidence of thrombus or thrombophlebitis. Essentially normal right lower extremity venous duplex Doppler evaluation.  . Carotid bruit   . Mitral valve regurgitation     Mild to moderate by ECHO 07/21/12  . Sleep-related hypoventilation   . COPD with emphysema   . Coronary atherosclerosis of unspecified type of vessel, native  or graft   . Cataract    Past Surgical History  Past Surgical History  Procedure Laterality Date  . Rotator cuff repair  07/2005    right  . Cardiac catheterization  2006 & 2011    Cath in 2006 showed an occluded RCA with left-to-right collaterals & otherwise noncritical CAD with EF=25-30% at that time. EF subsequently improved to normal by 2D ECHO. Cath Aug. 2011 showed unchanged anatomy. > medical therapy recommended.  . Coronary angioplasty    . Coronary angioplasty with stent placement      x2  . Carotid duplex  12/05/11    Moderate, right greater than left which we following by duplex ultrasound. [place] 12/05/11 = RIght Bulb/Prox ICA: Moderate to severe amt of fibrous plaque elevating velocities w/in prox segment of ICA. Consistent w/a 50-69% diameter reduction. Left Bulb/Prox ICA: Moderate amt of fibrous plaque slightly elevating velocities w/in the prox segment of the ICA. Consistent w/a 0-49% diameter reduction.   . Venous duplex  07/27/11    Venous Duplex 07/27/11 = Right lower extremity: no evidence of thrombus or thrombophlebitis. Essentially normal right lower extremity venous duplex Doppler evaluation.  . Abdominal hysterectomy      complete  . Transvaginal tape (tvt) removal    . Eye surgery     Family History  Family History  Problem Relation Age of Onset  . Cancer Mother     Skin  . Heart disease Mother   . Emphysema Father   . Heart disease Father   . Congestive Heart Failure Father   . Heart disease Brother   . Heart disease Brother   . Early death Sister    Social History  reports that she quit smoking about 28 years ago. Her smoking use included Cigarettes. She has a 22.5 pack-year smoking history. She has never used smokeless tobacco. She reports that she drinks alcohol. She reports that she does not use illicit drugs. Allergies  Allergies  Allergen Reactions   . Atorvastatin Other (See Comments)    muscle aches  . Codeine Nausea And Vomiting  . Morphine Nausea And Vomiting  . Other Other (See Comments)    OPIATES cause nausea and vomiting  . Rosuvastatin Other (See Comments)    muscle aches at high doses, held as of 12/2010 due to aches  . Iohexol Other (See Comments)    Desc: unknown reaction; allergic to iodine and contrast    Home medications Prior to Admission medications   Medication Sig Start Date End Date Taking? Authorizing Provider  ALPRAZolam Duanne Moron) 0.25 MG tablet TAKE 1/2 TO 1 TABLET 2 TIMES A DAY AS NEEDED FOR ANXIETY 06/30/14   Chipper Herb, MD  amiodarone (PACERONE) 200 MG tablet Take 0.5 tablets (100 mg total) by mouth daily. Please keep upcoming appointment 12/30/14   Lorretta Harp, MD  aspirin 81 MG chewable tablet Chew 81 mg by mouth daily.    Historical Provider, MD  atorvastatin (LIPITOR) 40 MG tablet Take 1 tablet (40 mg total) by mouth every evening. 11/18/14   Lorretta Harp, MD  Azelastine HCl  0.15 % SOLN Place 1 spray into both nostrils 2 (two) times daily as needed (congestion).    Historical Provider, MD  budesonide-formoterol (SYMBICORT) 160-4.5 MCG/ACT inhaler Inhale 1 puff into the lungs 2 (two) times daily. 01/24/14   Chipper Herb, MD  Cholecalciferol (VITAMIN D PO) Take 1 capsule by mouth daily.     Historical Provider, MD  fluticasone (FLONASE) 50 MCG/ACT nasal spray Place 1 spray into both nostrils 2 (two) times daily. 12/24/13   Chesley Mires, MD  furosemide (LASIX) 40 MG tablet Take 1 tablet (40 mg total) by mouth daily. May take extra dose if weight increases 3.5 pounds in 24 hrs as needed for fluid and edema. 09/11/14   Lorretta Harp, MD  glucosamine-chondroitin 500-400 MG tablet Take 1 tablet by mouth 2 (two) times daily.    Historical Provider, MD  glucose blood (RELION ULTIMA TEST) test strip USE TO CHECK  GLUCOSE UP TO 4 TIMES DAILY 07/21/14   Chipper Herb, MD  guaiFENesin (MUCINEX) 600 MG 12 hr tablet Take 2 tablets (1,200 mg total) by mouth 2 (two) times daily as needed for cough or to loosen phlegm. Patient not taking: Reported on 01/08/2015 12/17/13   Chesley Mires, MD  insulin aspart (NOVOLOG) 100 UNIT/ML injection Inject 0 to 8 units up to 3 times a day prior to meal per sliding scale 01/24/14   Chipper Herb, MD  insulin detemir (LEVEMIR) 100 UNIT/ML injection Inject 0.16 mLs (16 Units total) into the skin every morning. 01/24/14   Chipper Herb, MD  insulin lispro (HUMALOG KWIKPEN) 100 UNIT/ML KiwkPen Inject 0-8 Units into the skin 3 (three) times daily before meals. Per sliding scale    Historical Provider, MD  Ipratropium-Albuterol (COMBIVENT) 20-100 MCG/ACT AERS respimat Inhale 1 puff into the lungs every 6 (six) hours as needed for wheezing or shortness of breath. 01/24/14   Chipper Herb, MD  isosorbide mononitrate (IMDUR) 60 MG 24 hr tablet TAKE 1 TABLET DAILY 12/16/14   Chipper Herb, MD  metoprolol succinate (TOPROL-XL) 25 MG 24 hr tablet TAKE 1/2 TABLET DAILY 12/15/14   Chipper Herb, MD  Multiple Vitamins-Minerals (PRESERVISION/LUTEIN) CAPS Take 1 capsule by mouth daily.     Historical Provider, MD  NIFEdipine (NIFEDICAL XL) 30 MG 24 hr tablet Take 1 tablet (30 mg total) by mouth daily. PATIENT NEEDS TO CONTACT OFFICE FOR ADDITIONAL REFILLS 12/22/14   Lorretta Harp, MD  nitroGLYCERIN (NITROSTAT) 0.4 MG SL tablet Place 1 tablet (0.4 mg total) under the tongue every 5 (five) minutes as needed for chest pain. 05/09/14   Mary-Margaret Hassell Done, FNP  pantoprazole (PROTONIX) 40 MG tablet TAKE 1 TABLET TWICE A DAY 08/22/14   Mary-Margaret Hassell Done, FNP  potassium chloride SA (K-DUR,KLOR-CON) 20 MEQ tablet Take 1 tablet (20 mEq total) by mouth daily. 01/12/15   Lorretta Harp, MD  senna-docusate (SENOKOT-S) 8.6-50 MG per  tablet Take 1 tablet by mouth 2 (two) times daily.    Historical Provider, MD  simethicone (MYLICON) 80 MG chewable tablet Chew 80 mg by mouth at bedtime as needed for flatulence.    Historical Provider, MD  vitamin C (ASCORBIC ACID) 500 MG tablet Take 500 mg by mouth at bedtime.     Historical Provider, MD  warfarin (COUMADIN) 5 MG tablet TAKE (1) TABLET DAILY AFTER SUPPER. 10/16/14   Chipper Herb, MD   Liver Function Tests  Last Labs     No results for input(s): AST, ALT, ALKPHOS, BILITOT,  PROT, ALBUMIN in the last 168 hours.    Last Labs     No results for input(s): LIPASE, AMYLASE in the last 168 hours.   CBC  Last Labs     No results for input(s): WBC, NEUTROABS, HGB, HCT, MCV, PLT in the last 168 hours.   Basic Metabolic Panel  Last Labs     No results for input(s): NA, K, CL, CO2, GLUCOSE, BUN, CREATININE, ALB, CALCIUM, PHOS in the last 168 hours.      Filed Vitals:   01/13/15 1746  BP: 149/63  Pulse: 78  Temp: 97.8 F (36.6 C)  TempSrc: Oral  Resp: 15  SpO2: 96%   Exam: Drowsy but easily arouses, O x3 No rash, cyanosis or gangrene Sclera anicteric, throat clear No jvd, +bilat carotid bruits Chest clear bilat no rales or wheezing RRR 2/6 SEM no RG Abd soft ntnd no mass or ascites obese +bs GU foley draining clear amber urine LE's no edema, wounds or ulcers Neuro sensation intact, L leg dec'd ROM d/t fracture  7/18 labs Na 140 K 4.0 CO2 29 BUN 24 Creat 0.98 eGFR 54 Ca 9 7/18 labs WBC 9k Hb 10.4 plt 213 EColi urine culture 7/14 S rocephin, rec'd IV Rocephin 7/15 - 7/18 INR 7/19 (today)- 4.5  Assessment: 1. Fall / L hip fracture - INR needs to be corrected 2. COPD on home O2 3. CAD - cath 2006 w occluded RCA, L> R collaterals, otherwise noncritical CAD, had EF 25-30% at the time but since improved to normal. Cath 2011 showed no change in anatomy. F/B Dr Gwenlyn Found.  4. Chronic afib - INR up at [facility], coumadin  held last 2 days per family. INR today was 4.5. Will repeat here.  5. DM 2 on insulin, cont detemir + SSI 6. HTN on MTP/ procardia  Plan - give vit K to correct INR, pain meds, stopping abx (had 4d IV abx) for UTI , recheck UA. Ortho notified (pt of Dr Jake Church, have d/w Dr Maureen Ralphs, they will see patient) and also Cardiology (f/b Dr Gwenlyn Found). Resp status stable. Get labs her, EKG/ CXR tonight. Pt clinically stable.    DVT Prophylaxis lovenox  Code Status: full  Family Communication: two daughters at bedside  Disposition Plan: rehab or SNF when stable postop    Rahaf Carbonell D [facility] Pager [phone]  If 7PM-7AM, please contact night-coverage [email or web address] Password [facility] 01/13/2015, 7:24 PM

## 2015-01-14 NOTE — Progress Notes (Signed)
I attempted to see the patient/family but she was at MRI. I have spoken with one of my partners, Dr. Netta Cedars, who will be performing the surgery. Plan was for tomorrow but her INR is worsening instead of improving. If INR improves will proceed with intramedullary nailing of left femur tomorrow. Have placed orders for her to be NPO after midnight in preparation for potential surgery if her INR improves.

## 2015-01-15 ENCOUNTER — Encounter (HOSPITAL_COMMUNITY): Payer: Self-pay | Admitting: Certified Registered Nurse Anesthetist

## 2015-01-15 ENCOUNTER — Inpatient Hospital Stay (HOSPITAL_COMMUNITY): Payer: Medicare Other | Admitting: Certified Registered Nurse Anesthetist

## 2015-01-15 ENCOUNTER — Inpatient Hospital Stay (HOSPITAL_COMMUNITY): Payer: Medicare Other

## 2015-01-15 ENCOUNTER — Encounter (HOSPITAL_COMMUNITY)
Admission: AD | Disposition: A | Discharge status: Skilled Nursing Facility | Payer: Self-pay | Source: Other Acute Inpatient Hospital | Attending: Internal Medicine

## 2015-01-15 DIAGNOSIS — S72009A Fracture of unspecified part of neck of unspecified femur, initial encounter for closed fracture: Secondary | ICD-10-CM | POA: Diagnosis present

## 2015-01-15 HISTORY — PX: FEMUR IM NAIL: SHX1597

## 2015-01-15 LAB — COMPREHENSIVE METABOLIC PANEL
ALK PHOS: 64 U/L (ref 38–126)
ALT: 16 U/L (ref 14–54)
AST: 17 U/L (ref 15–41)
Albumin: 2.5 g/dL — ABNORMAL LOW (ref 3.5–5.0)
Anion gap: 10 (ref 5–15)
BUN: 21 mg/dL — ABNORMAL HIGH (ref 6–20)
CALCIUM: 9.2 mg/dL (ref 8.9–10.3)
CO2: 33 mmol/L — ABNORMAL HIGH (ref 22–32)
Chloride: 100 mmol/L — ABNORMAL LOW (ref 101–111)
Creatinine, Ser: 1.1 mg/dL — ABNORMAL HIGH (ref 0.44–1.00)
GFR calc non Af Amer: 45 mL/min — ABNORMAL LOW (ref >60)
GFR, EST AFRICAN AMERICAN: 52 mL/min — AB (ref >60)
GLUCOSE: 99 mg/dL (ref 65–99)
POTASSIUM: 3.7 mmol/L (ref 3.5–5.1)
SODIUM: 143 mmol/L (ref 135–145)
Total Bilirubin: 0.8 mg/dL (ref 0.3–1.2)
Total Protein: 6.3 g/dL — ABNORMAL LOW (ref 6.5–8.1)

## 2015-01-15 LAB — PROTIME-INR
INR: 3.34 — AB (ref 0.00–1.49)
INR: 1.84 — AB (ref 0.00–1.49)
Prothrombin Time: 33.2 seconds — ABNORMAL HIGH (ref 11.6–15.2)
Prothrombin Time: 21.2 seconds — ABNORMAL HIGH (ref 11.6–15.2)

## 2015-01-15 LAB — BLOOD GAS, ARTERIAL
ACID-BASE EXCESS: 9.7 mmol/L — AB (ref 0.0–2.0)
Bicarbonate: 35.2 mEq/L — ABNORMAL HIGH (ref 20.0–24.0)
DRAWN BY: 283381
O2 Content: 7 L/min
O2 Saturation: 94.6 %
PH ART: 7.369 (ref 7.350–7.450)
PO2 ART: 78.2 mmHg — AB (ref 80.0–100.0)
Patient temperature: 98.6
TCO2: 37.1 mmol/L (ref 0–100)
pCO2 arterial: 62.6 mmHg (ref 35.0–45.0)

## 2015-01-15 LAB — CBC
HEMATOCRIT: 31.3 % — AB (ref 36.0–46.0)
HEMOGLOBIN: 9.6 g/dL — AB (ref 12.0–15.0)
MCH: 30.1 pg (ref 26.0–34.0)
MCHC: 30.7 g/dL (ref 30.0–36.0)
MCV: 98.1 fL (ref 78.0–100.0)
Platelets: 227 10*3/uL (ref 150–400)
RBC: 3.19 MIL/uL — ABNORMAL LOW (ref 3.87–5.11)
RDW: 13.6 % (ref 11.5–15.5)
WBC: 7.1 10*3/uL (ref 4.0–10.5)

## 2015-01-15 LAB — GLUCOSE, CAPILLARY
Glucose-Capillary: 68 mg/dL (ref 65–99)
Glucose-Capillary: 106 mg/dL — ABNORMAL HIGH (ref 65–99)
Glucose-Capillary: 145 mg/dL — ABNORMAL HIGH (ref 65–99)
Glucose-Capillary: 112 mg/dL — ABNORMAL HIGH (ref 65–99)
Glucose-Capillary: 112 mg/dL — ABNORMAL HIGH (ref 65–99)
Glucose-Capillary: 110 mg/dL — ABNORMAL HIGH (ref 65–99)

## 2015-01-15 LAB — SURGICAL PCR SCREEN
MRSA, PCR: NEGATIVE
Staphylococcus aureus: NEGATIVE

## 2015-01-15 LAB — VITAMIN D 25 HYDROXY (VIT D DEFICIENCY, FRACTURES): Vit D, 25-Hydroxy: 24.3 ng/mL — ABNORMAL LOW (ref 30.0–100.0)

## 2015-01-15 SURGERY — INSERTION, INTRAMEDULLARY ROD, FEMUR
Anesthesia: General | Site: Hip | Laterality: Left

## 2015-01-15 MED ORDER — CHLORHEXIDINE GLUCONATE 4 % EX LIQD
60.0000 mL | Freq: Once | CUTANEOUS | Status: AC
  Administered 2015-01-15: 4 via TOPICAL
  Filled 2015-01-15: qty 60

## 2015-01-15 MED ORDER — SUCCINYLCHOLINE CHLORIDE 20 MG/ML IJ SOLN
INTRAMUSCULAR | Status: DC | PRN
  Administered 2015-01-15: 80 mg via INTRAVENOUS

## 2015-01-15 MED ORDER — CEFAZOLIN SODIUM-DEXTROSE 2-3 GM-% IV SOLR
2.0000 g | Freq: Four times a day (QID) | INTRAVENOUS | Status: AC
  Administered 2015-01-15 – 2015-01-16 (×2): 2 g via INTRAVENOUS
  Filled 2015-01-15 (×2): qty 50

## 2015-01-15 MED ORDER — ONDANSETRON HCL 4 MG/2ML IJ SOLN: 4.0000 mg | Freq: Once | INTRAMUSCULAR | Status: DC | PRN

## 2015-01-15 MED ORDER — FERROUS SULFATE 325 (65 FE) MG PO TABS
325.0000 mg | ORAL_TABLET | Freq: Three times a day (TID) | ORAL | Status: DC
Start: 2015-01-16 — End: 2015-01-23
  Administered 2015-01-16 – 2015-01-23 (×22): 325 mg via ORAL
  Filled 2015-01-15 (×26): qty 1

## 2015-01-15 MED ORDER — HYDROCODONE-ACETAMINOPHEN 5-325 MG PO TABS: 1.0000 | ORAL_TABLET | Freq: Four times a day (QID) | ORAL | Status: DC | PRN

## 2015-01-15 MED ORDER — FUROSEMIDE 10 MG/ML IJ SOLN
40.0000 mg | Freq: Once | INTRAMUSCULAR | Status: AC
  Administered 2015-01-15: 40 mg via INTRAVENOUS

## 2015-01-15 MED ORDER — PHENOL 1.4 % MT LIQD
1.0000 | OROMUCOSAL | Status: DC | PRN
  Filled 2015-01-15: qty 177

## 2015-01-15 MED ORDER — INSULIN DETEMIR 100 UNIT/ML ~~LOC~~ SOLN
8.0000 [IU] | Freq: Every morning | SUBCUTANEOUS | Status: DC
Start: 2015-01-15 — End: 2015-01-23
  Administered 2015-01-15 – 2015-01-23 (×9): 8 [IU] via SUBCUTANEOUS
  Filled 2015-01-15 (×10): qty 0.08

## 2015-01-15 MED ORDER — PROPOFOL 10 MG/ML IV BOLUS
INTRAVENOUS | Status: DC | PRN
  Administered 2015-01-15: 100 mg via INTRAVENOUS

## 2015-01-15 MED ORDER — HYDROMORPHONE HCL 1 MG/ML IJ SOLN
INTRAMUSCULAR | Status: AC
  Filled 2015-01-15: qty 1

## 2015-01-15 MED ORDER — ONDANSETRON HCL 4 MG/2ML IJ SOLN
INTRAMUSCULAR | Status: DC | PRN
  Administered 2015-01-15: 4 mg via INTRAVENOUS

## 2015-01-15 MED ORDER — METOCLOPRAMIDE HCL 5 MG/ML IJ SOLN
5.0000 mg | Freq: Three times a day (TID) | INTRAMUSCULAR | Status: DC | PRN
  Filled 2015-01-15: qty 2

## 2015-01-15 MED ORDER — FENTANYL CITRATE (PF) 100 MCG/2ML IJ SOLN
25.0000 ug | INTRAMUSCULAR | Status: DC | PRN
  Administered 2015-01-15: 25 ug via INTRAVENOUS

## 2015-01-15 MED ORDER — LIDOCAINE HCL (CARDIAC) 20 MG/ML IV SOLN
INTRAVENOUS | Status: DC | PRN
  Administered 2015-01-15: 50 mg via INTRATRACHEAL
  Administered 2015-01-15: 50 mg via INTRAVENOUS

## 2015-01-15 MED ORDER — LACTATED RINGERS IV SOLN
INTRAVENOUS | Status: DC | PRN
  Administered 2015-01-15: 14:00:00 via INTRAVENOUS

## 2015-01-15 MED ORDER — WARFARIN SODIUM 7.5 MG PO TABS
7.5000 mg | ORAL_TABLET | Freq: Once | ORAL | Status: AC
  Administered 2015-01-16: 7.5 mg via ORAL
  Filled 2015-01-15: qty 1

## 2015-01-15 MED ORDER — MENTHOL 3 MG MT LOZG
1.0000 | LOZENGE | OROMUCOSAL | Status: DC | PRN
  Filled 2015-01-15: qty 9

## 2015-01-15 MED ORDER — ACETAMINOPHEN 650 MG RE SUPP: 650.0000 mg | Freq: Four times a day (QID) | RECTAL | Status: DC | PRN

## 2015-01-15 MED ORDER — HYDROCODONE-ACETAMINOPHEN 5-325 MG PO TABS
1.0000 | ORAL_TABLET | Freq: Four times a day (QID) | ORAL | Status: DC | PRN
  Administered 2015-01-16: 2 via ORAL
  Administered 2015-01-17 – 2015-01-22 (×7): 1 via ORAL
  Filled 2015-01-15 (×3): qty 1
  Filled 2015-01-15: qty 2
  Filled 2015-01-15 (×5): qty 1

## 2015-01-15 MED ORDER — ROCURONIUM BROMIDE 100 MG/10ML IV SOLN
INTRAVENOUS | Status: DC | PRN
  Administered 2015-01-15: 15 mg via INTRAVENOUS

## 2015-01-15 MED ORDER — VITAMIN K1 10 MG/ML IJ SOLN
3.0000 mg | Freq: Once | INTRAMUSCULAR | Status: AC
  Administered 2015-01-15: 3 mg via INTRAVENOUS
  Filled 2015-01-15: qty 0.3

## 2015-01-15 MED ORDER — METOCLOPRAMIDE HCL 5 MG PO TABS
5.0000 mg | ORAL_TABLET | Freq: Three times a day (TID) | ORAL | Status: DC | PRN
  Filled 2015-01-15: qty 2

## 2015-01-15 MED ORDER — FUROSEMIDE 10 MG/ML IJ SOLN
40.0000 mg | Freq: Once | INTRAMUSCULAR | Status: AC
  Administered 2015-01-16: 40 mg via INTRAVENOUS
  Filled 2015-01-15: qty 4

## 2015-01-15 MED ORDER — FENTANYL CITRATE (PF) 100 MCG/2ML IJ SOLN
25.0000 ug | INTRAMUSCULAR | Status: DC
  Administered 2015-01-15: 25 ug via INTRAVENOUS
  Filled 2015-01-15: qty 2

## 2015-01-15 MED ORDER — FENTANYL CITRATE (PF) 100 MCG/2ML IJ SOLN
INTRAMUSCULAR | Status: AC
  Administered 2015-01-15: 25 ug
  Filled 2015-01-15: qty 2

## 2015-01-15 MED ORDER — ACETAMINOPHEN 325 MG PO TABS
650.0000 mg | ORAL_TABLET | Freq: Four times a day (QID) | ORAL | Status: DC | PRN
  Administered 2015-01-17 – 2015-01-23 (×2): 650 mg via ORAL
  Filled 2015-01-15 (×2): qty 2

## 2015-01-15 MED ORDER — LACTATED RINGERS IV SOLN
INTRAVENOUS | Status: DC
  Administered 2015-01-15: 14:00:00 via INTRAVENOUS

## 2015-01-15 MED ORDER — BISACODYL 10 MG RE SUPP
10.0000 mg | Freq: Every day | RECTAL | Status: DC | PRN
  Filled 2015-01-15: qty 1

## 2015-01-15 MED ORDER — ONDANSETRON HCL 4 MG/2ML IJ SOLN: 4.0000 mg | Freq: Four times a day (QID) | INTRAMUSCULAR | Status: DC | PRN

## 2015-01-15 MED ORDER — HYDROMORPHONE HCL 1 MG/ML IJ SOLN
0.5000 mg | INTRAMUSCULAR | Status: DC | PRN
  Administered 2015-01-15 (×3): 0.25 mg via INTRAVENOUS

## 2015-01-15 MED ORDER — PHENYLEPHRINE HCL 10 MG/ML IJ SOLN
10.0000 mg | INTRAVENOUS | Status: DC | PRN
  Administered 2015-01-15: 25 ug/min via INTRAVENOUS

## 2015-01-15 MED ORDER — GLYCOPYRROLATE 0.2 MG/ML IJ SOLN
INTRAMUSCULAR | Status: DC | PRN
  Administered 2015-01-15: 0.4 mg via INTRAVENOUS

## 2015-01-15 MED ORDER — SODIUM CHLORIDE 0.9 % IV SOLN
INTRAVENOUS | Status: DC
  Administered 2015-01-15: 50 mL/h via INTRAVENOUS

## 2015-01-15 MED ORDER — NEOSTIGMINE METHYLSULFATE 10 MG/10ML IV SOLN
INTRAVENOUS | Status: DC | PRN
  Administered 2015-01-15: 3 mg via INTRAVENOUS

## 2015-01-15 MED ORDER — FENTANYL CITRATE (PF) 100 MCG/2ML IJ SOLN
INTRAMUSCULAR | Status: DC | PRN
  Administered 2015-01-15: 100 ug via INTRAVENOUS
  Administered 2015-01-15 (×2): 50 ug via INTRAVENOUS

## 2015-01-15 MED ORDER — 0.9 % SODIUM CHLORIDE (POUR BTL) OPTIME
TOPICAL | Status: DC | PRN
  Administered 2015-01-15: 1000 mL

## 2015-01-15 MED ORDER — WARFARIN SODIUM 5 MG PO TABS: 5.0000 mg | ORAL_TABLET | Freq: Every day | ORAL | Status: DC

## 2015-01-15 MED ORDER — SODIUM CHLORIDE 0.9 % IV SOLN
Freq: Once | INTRAVENOUS | Status: AC
Start: 2015-01-15 — End: 2015-01-15
  Administered 2015-01-15: 09:00:00 via INTRAVENOUS

## 2015-01-15 MED ORDER — FENTANYL CITRATE (PF) 250 MCG/5ML IJ SOLN
INTRAMUSCULAR | Status: AC
  Filled 2015-01-15: qty 5

## 2015-01-15 MED ORDER — MORPHINE SULFATE 2 MG/ML IJ SOLN
0.5000 mg | INTRAMUSCULAR | Status: DC | PRN
  Administered 2015-01-15 – 2015-01-19 (×6): 0.5 mg via INTRAVENOUS
  Filled 2015-01-15 (×6): qty 1

## 2015-01-15 MED ORDER — FUROSEMIDE 10 MG/ML IJ SOLN
INTRAMUSCULAR | Status: AC
  Filled 2015-01-15: qty 4

## 2015-01-15 MED ORDER — WARFARIN - PHARMACIST DOSING INPATIENT
Freq: Every day | Status: DC
  Administered 2015-01-17: 18:00:00
  Administered 2015-01-20: 1

## 2015-01-15 MED ORDER — ONDANSETRON HCL 4 MG PO TABS: 4.0000 mg | ORAL_TABLET | Freq: Four times a day (QID) | ORAL | Status: DC | PRN

## 2015-01-15 SURGICAL SUPPLY — 54 items
BANDAGE ELASTIC 4 VELCRO ST LF (GAUZE/BANDAGES/DRESSINGS) IMPLANT
BANDAGE ELASTIC 6 VELCRO ST LF (GAUZE/BANDAGES/DRESSINGS) IMPLANT
BIT DRILL CANN LG 4.3MM (BIT) ×1 IMPLANT
BNDG COHESIVE 3X5 TAN STRL LF (GAUZE/BANDAGES/DRESSINGS) ×3 IMPLANT
BNDG GAUZE ELAST 4 BULKY (GAUZE/BANDAGES/DRESSINGS) ×3 IMPLANT
COVER PERINEAL POST (MISCELLANEOUS) ×3 IMPLANT
COVER SURGICAL LIGHT HANDLE (MISCELLANEOUS) ×3 IMPLANT
DRAPE C-ARM 42X72 X-RAY (DRAPES) IMPLANT
DRAPE INCISE IOBAN 66X45 STRL (DRAPES) IMPLANT
DRAPE ORTHO SPLIT 77X108 STRL (DRAPES)
DRAPE PROXIMA HALF (DRAPES) IMPLANT
DRAPE STERI IOBAN 125X83 (DRAPES) IMPLANT
DRAPE SURG ORHT 6 SPLT 77X108 (DRAPES) IMPLANT
DRAPE U-SHAPE 47X51 STRL (DRAPES) IMPLANT
DRILL BIT CANN LG 4.3MM (BIT) ×3
DRSG ADAPTIC 3X8 NADH LF (GAUZE/BANDAGES/DRESSINGS) IMPLANT
DRSG MEPILEX BORDER 4X4 (GAUZE/BANDAGES/DRESSINGS) ×6 IMPLANT
DRSG MEPILEX BORDER 4X8 (GAUZE/BANDAGES/DRESSINGS) IMPLANT
DRSG PAD ABDOMINAL 8X10 ST (GAUZE/BANDAGES/DRESSINGS) ×3 IMPLANT
DURAPREP 26ML APPLICATOR (WOUND CARE) ×3 IMPLANT
ELECT REM PT RETURN 9FT ADLT (ELECTROSURGICAL) ×3
ELECTRODE REM PT RTRN 9FT ADLT (ELECTROSURGICAL) ×1 IMPLANT
GLOVE BIOGEL PI ORTHO PRO 7.5 (GLOVE) ×2
GLOVE BIOGEL PI ORTHO PRO SZ8 (GLOVE) ×2
GLOVE ORTHO TXT STRL SZ7.5 (GLOVE) ×3 IMPLANT
GLOVE PI ORTHO PRO STRL 7.5 (GLOVE) ×1 IMPLANT
GLOVE PI ORTHO PRO STRL SZ8 (GLOVE) ×1 IMPLANT
GLOVE SURG ORTHO 8.5 STRL (GLOVE) ×3 IMPLANT
GOWN STRL REUS W/ TWL LRG LVL3 (GOWN DISPOSABLE) ×3 IMPLANT
GOWN STRL REUS W/ TWL XL LVL3 (GOWN DISPOSABLE) ×2 IMPLANT
GOWN STRL REUS W/TWL LRG LVL3 (GOWN DISPOSABLE) ×9
GOWN STRL REUS W/TWL XL LVL3 (GOWN DISPOSABLE) ×6
GUIDEPIN 3.2X17.5 THRD DISP (PIN) ×3 IMPLANT
HIP FRAC NAIL LAG SCR 10.5X100 (Orthopedic Implant) ×2 IMPLANT
KIT BASIN OR (CUSTOM PROCEDURE TRAY) ×3 IMPLANT
KIT ROOM TURNOVER OR (KITS) ×3 IMPLANT
LINER BOOT UNIVERSAL DISP (MISCELLANEOUS) IMPLANT
MANIFOLD NEPTUNE II (INSTRUMENTS) ×3 IMPLANT
NAIL HIP FRACT 130D 11X180 (Screw) ×3 IMPLANT
NS IRRIG 1000ML POUR BTL (IV SOLUTION) ×3 IMPLANT
PACK GENERAL/GYN (CUSTOM PROCEDURE TRAY) ×3 IMPLANT
PAD ARMBOARD 7.5X6 YLW CONV (MISCELLANEOUS) ×3 IMPLANT
PAD CAST 4YDX4 CTTN HI CHSV (CAST SUPPLIES) ×1 IMPLANT
PADDING CAST COTTON 4X4 STRL (CAST SUPPLIES) ×3
SCREW BONE CORTICAL 5.0X3 (Screw) ×3 IMPLANT
SCREW CANN THRD AFF 10.5X100 (Orthopedic Implant) ×1 IMPLANT
STAPLER VISISTAT 35W (STAPLE) ×3 IMPLANT
SUT ETHILON 4 0 PS 2 18 (SUTURE) ×3 IMPLANT
SUT VIC AB 0 CTB1 27 (SUTURE) IMPLANT
SUT VIC AB 1 CT1 27 (SUTURE) ×3
SUT VIC AB 1 CT1 27XBRD ANBCTR (SUTURE) ×1 IMPLANT
SUT VIC AB 2-0 CT1 27 (SUTURE)
SUT VIC AB 2-0 CT1 TAPERPNT 27 (SUTURE) IMPLANT
WATER STERILE IRR 1000ML POUR (IV SOLUTION) IMPLANT

## 2015-01-15 NOTE — Anesthesia Preprocedure Evaluation (Addendum)
Anesthesia Evaluation  Patient identified by MRN, date of birth, ID band Patient confused    Reviewed: Allergy & Precautions, NPO status , Patient's Chart, lab work & pertinent test results  History of Anesthesia Complications (+) PONV  Airway Mallampati: I       Dental   Pulmonary sleep apnea , COPDformer smoker,    Pulmonary exam normal + decreased breath sounds      Cardiovascular hypertension, + CAD, + Past MI, + Peripheral Vascular Disease and +CHF Normal cardiovascular exam+ dysrhythmias     Neuro/Psych CVA, Residual Symptoms    GI/Hepatic GERD-  ,  Endo/Other  diabetes, Type 2  Renal/GU Renal InsufficiencyRenal disease     Musculoskeletal  (+) Arthritis -,   Abdominal   Peds  Hematology   Anesthesia Other Findings   Reproductive/Obstetrics                            Anesthesia Physical Anesthesia Plan  ASA: III  Anesthesia Plan: General   Post-op Pain Management:    Induction: Intravenous  Airway Management Planned: Oral ETT  Additional Equipment:   Intra-op Plan:   Post-operative Plan: Extubation in OR  Informed Consent: I have reviewed the patients History and Physical, chart, labs and discussed the procedure including the risks, benefits and alternatives for the proposed anesthesia with the patient or authorized representative who has indicated his/her understanding and acceptance.     Plan Discussed with: CRNA, Anesthesiologist and Surgeon  Anesthesia Plan Comments:         Anesthesia Quick Evaluation

## 2015-01-15 NOTE — Op Note (Signed)
NAMENYEEMA, WANT NO.:  [id number]  MEDICAL RECORD NO.:  [id number]  LOCATION:  2C01C                        FACILITY:  [facility]  PHYSICIAN:  Doran Heater. Veverly Fells, M.D. DATE OF BIRTH:  1927-10-28  DATE OF PROCEDURE:  01/13/2015 DATE OF DISCHARGE:                              OPERATIVE REPORT   PREOPERATIVE DIAGNOSIS:  Left displaced intertrochanteric femur fracture.  POSTOPERATIVE DIAGNOSIS:  Left displaced intertrochanteric femur fracture.  PROCEDURE PERFORMED:  Closed intramedullary nailing of left displaced intertrochanteric femur fracture using Biomet nail.  ATTENDING SURGEON:  Doran Heater. Veverly Fells, M.D.  ASSISTANT:  Abbott Pao. Dixon, PA-C, who has scrubbed the entire procedure and necessary for satisfactory completion of surgery.  ANESTHESIA:  General anesthesia was used.  ESTIMATED BLOOD LOSS:  100 mL.  FLUID REPLACEMENT:  1200 mL crystalloid.  INSTRUMENT COUNTS:  Correct.  COMPLICATIONS:  There were no complications.  ANTIBIOTICS:  Perioperative antibiotics given.  INDICATIONS:  The patient is an 81 year old female who suffered a fall out of bed at the [facility] where she was being treated for neck pain.  The patient had some disorientation and fell on the bed rail, was left down.  The patient suffered an intertrochanteric hip fracture and was unable to weight bear after that.  She was transferred down to [facility] for further orthopedic care.  The patient was seen initially by my partner, Dr. Gaynelle Arabian who diagnosed her with intertrochanteric femur fracture.  Unfortunately, the patient has cardiac issues requiring Coumadin and her INR was supratherapeutic.  It took them a couple of days to get her INR down.  She presents now for operative treatment with an INR of 1.8. Risks and benefits of surgical management were discussed with the patient and family.  Informed consent was obtained.  DESCRIPTION OF PROCEDURE:  After an  adequate anesthesia was achieved, the patient was positioned supine on the operating room table.  The patient was transferred to a fracture table.  Perineal post utilized and padded appropriately.  The right leg was placed in a modified lithotomy position.  The left leg was placed in a traction boot and secured. Distal traction internal rotation applied. X-ray brought in and after time-out was called, we reduced the fracture with combination of internal rotation and distal traction.  We obtained x-rays multiplanar and demonstrated appropriate intertrochanteric fracture reduction.  We then went ahead and sterilely prepped and draped the left lateral leg and applied the sterile shower curtain.  Once we had confirmed our time- out, the correct leg and correct patient, we went ahead and made a longitudinal incision proximal to the greater trochanter of the left femur.  Dissection down through the subcutaneous tissues with Bovie, we identified the tensor fascia lata, divided that in line with the skin incision.  We were able to identify the greater trochanter and placed our guide pin from the greater trochanter across the fracture site in the distal femur.  We verified its appropriate position on AP and lateral planes with the C-arm and then over drilled with a step-cut drill for the Biomet trochanteric nail.  We selected the short nail which was 11 mm in diameter and introduced that once we  had drilled with a step-cut drill.  We introduced it across the fracture site which reduced the fracture nicely. We got to the appropriate depth.  Then, we went and placed a guide pin up into the femur, low on the AP, and centered on the lateral.  Once we were pleased on that position, we drilled with a step-cut drill and then placed the appropriate length super lag screw across the fracture site and gained good purchase in the subchondral bone of the femoral head and neck.  We then went ahead and used the  compression device on the system, took a little traction off the distal femur and then compressed the fracture site and locked the screw down, the set screw and then turned it back 1 quarter so we could collapse.  We next went ahead and drilled the hole for the interlocking screw distally. This was done off the jig and was the static locking screw, this was a 4.5 screw bicortical. Good purchase, appropriate position of the screw, and appropriate length.  We verified our final implant position, removed our jig, irrigated the wound thoroughly, closed the fascia with interrupted #1 Vicryl suture followed by 2-0 Vicryl subcu and staples, and subcu in stable closure for the distal incisions as well.  We were able to get the patient transported safely to the recovery room stretcher in stable condition having tolerated the procedure well.     Doran Heater. Veverly Fells, M.D.     SRN/MEDQ  D:  01/15/2015  T:  01/15/2015  Job:  [id number]

## 2015-01-15 NOTE — Progress Notes (Addendum)
[name]  Zayleigh Stroh MWN:[id number] DOB: [birth year] DOA: 01/13/2015 PCP: Redge Gainer, MD  Assessment/Plan: Active Problems:   PAF, recurrance this admission, on chronic Amio/ Coumadin   COPD with emphysema   Chronic respiratory failure with hypoxia   HTN (hypertension)   CAD, remote RCA PCI, subsequently noted to be occluded, last cath 8/11   DM type 2 causing CKD stage 3   Hip fracture   Preop cardiovascular exam    Mechanical fall,Left intertrochanteric femur fracture Anticipated surgery at 2:45 PM this afternoon, still trying to correct INR FFP, vitamin K prior to surgery, repeat INR this afternoon Some improvement in headache and neck pain,  CT head and neck negative for intracranial bleed and fracture MRI of the cervical spine does not show any ligamentous disruption, shows facet arthropathy Dilaudid and baclofen for headache and neck pain,   Patient became somnolent for Flexeril at [facility] hospital   Addendum: patient hypoxic after hip surgery in PACU, received a call that patient will be transferred to step down post op,check CXR , abg , lasix 40 mg iv * 1   History of coronary artery disease, chronic diastolic congestive heart failure without exacerbation, paroxysmal atrial fibrillation, peripheral vascular disease Cardiology consulted for clearance prior to surgery EKG shows first-degree AV block, right bundle branch block Patient will be at increased risk for surgery, however no active chest pain Cardiology recommends to proceed without further cardiac evaluation Continue amiodarone for atrial fibrillation   Supratherapeutic INR Patient received a total of 12.5 mg of vitamin K yesterday INR is still elevated this morning Giving vitamin K 2.5 mg IV, 2 units of FFP prior to surgery    Peripheral vascular disease, on aspirin at home  Diabetes continue NovoLog and hold Levemir prior to surgery, hypoglycemic a 68 early this  morning  Hypertension continue home medications  Oxygen dependent COPD, continue Symbicort, when necessary nebulizer treatments      Code Status:      Code Status Orders        Start     Ordered   01/13/15 2020  Full code   Continuous     01/13/15 2019     Family Communication: family updated about patient's clinical progress Disposition Plan:  As above    Brief narrative: Arden Axon is a 81 y.o. female with a history of CAD, diastolic CHF, chronic RBBB, Paroxysmal AF (on coumadin and low dose amiodarone), PVD (moderate carotid and LSCA disease), DM2, HTN, HL,COPD on home O2, anxiety, former smoker and DM who was admitted to [facility] on 7/14 for headaches and neck pain. W/U showed EColi UTI and pt received IV Rocephin. Head CT showed old lacunar infarct. She became confused/ delirium in the hospital. She had a fell 7/18 and broke her L hip (intertrochanteric fx). She was transferred to [facility] for ortho eval and surgery. She was ambulatory with a walker and lived independently at home before her recent hospitalization. She denies any chest pain or palpitation.   Heart cath in 2006 showed an occluded RCA with left-to-right collaterals & otherwise noncritical CAD with EF=25-30% at that time. Her last cath was performed by Dr. Ellouise Newer 01/25/2010 revealed a chronically totally occluded dominant RCA, 50% proximal circumflex with left to right collaterals and normal LV function > medical therapy recommended.  Most recent echo 12/2013 showed LV EF of 55-60%, mild LVH, interobasal akinesis, grade 2 DD, moderate LAE and mild MR. She was doing  well on cardiac stand point of view when last seen by Dr. Gwenlyn Found 11/2013.  Consultants:  Cardiology  Orthopedics    Procedures:  None  Antibiotics: Anti-infectives    Start     Dose/Rate Route Frequency Ordered Stop   01/15/15 1430  ceFAZolin (ANCEF) IVPB 2 g/50 mL premix     2 g 100 mL/hr over 30 Minutes Intravenous To  [facility] 01/14/15 1259 01/16/15 1430         HPI/Subjective: Patient's daughter and the patient are in the room during my visit Patient is still complaining of some headache and neck pain Patient is extremely anxious and confused, requesting something for anxiety  Objective: Filed Vitals:   01/14/15 2130 01/15/15 0248 01/15/15 0533 01/15/15 0656  BP:  133/62 131/44 137/58  Pulse:  70 71   Temp:  98.4 F (36.9 C) 97.7 F (36.5 C)   TempSrc:  Oral Oral   Resp:  14 14   Weight:      SpO2: 92% 99% 96%     Intake/Output Summary (Last 24 hours) at 01/15/15 1043 Last data filed at 01/15/15 0845  Gross per 24 hour  Intake    291 ml  Output    750 ml  Net   -459 ml    Exam:  General: Uncomfortable Lungs: Clear to auscultation bilaterally without wheezes or crackles Cardiovascular: Regular rate and rhythm without murmur gallop or rub normal S1 and S2 Abdomen: Nontender, nondistended, soft, bowel sounds positive, no rebound, no ascites, no appreciable mass Extremities: No significant cyanosis, clubbing, or edema bilateral lower extremities     Data Review   Micro Results No results found for this or any previous visit (from the past 240 hour(s)).  Radiology Reports Ct Head Wo Contrast  01/14/2015   ADDENDUM REPORT: 01/14/2015 12:08  ADDENDUM: Study discussed by telephone with Dr. Reyne Dumas on 01/14/2015 at 1204 hours.   Electronically Signed   By: Genevie Ann M.D.   On: 01/14/2015 12:08   01/14/2015   CLINICAL DATA:  81 year old female who fell and was unconscious. Headache and cervical neck pain. Initial encounter.  EXAM: CT HEAD WITHOUT CONTRAST  CT CERVICAL SPINE WITHOUT CONTRAST  TECHNIQUE: Multidetector CT imaging of the head and cervical spine was performed following the standard protocol without intravenous contrast. Multiplanar CT image reconstructions of the cervical spine were also generated.  COMPARISON:  [facility] CT without contrast  718 2016, 01/08/2015. Cervical spine radiographs 09/05/2013. Cervical spine CT 07/10/2011.  FINDINGS: CT HEAD FINDINGS  Paranasal sinuses and mastoids appear stable and clear. Periapical left maxillary molar dental lucency appears inflammatory in nature. No calvarium fracture identified. No scalp hematoma. Orbits soft tissues appear stable.  Chronic Calcified atherosclerosis at the skull base. Cerebral volume is stable and normal for age. No ventriculomegaly. No midline shift, mass effect, or evidence of intracranial mass lesion. No acute intracranial hemorrhage identified. No evidence of cortically based acute infarction identified. Normal for age gray-white matter differentiation.  CT CERVICAL SPINE FINDINGS  Multilevel chronic cervical spondylolisthesis. Pronounced chronic anterolisthesis at C3-C4 appears stable since 2013. Mild anterolisthesis at C2-C3 and C7-T1 also appear stable. Associated chronic cervical facet arthropathy. Bilateral cervical posterior element alignment appears stable. Superimposed severe chronic lower cervical disc and endplate with chronic endplate osteophytosis. Degeneration occipital condyles to C1 alignment appears stable. Chronic C1-C2 degeneration may be mildly progressed since 2013. Stable C1-C2 alignment degenerative odontoid tip subchondral cysts do appear increased. No acute cervical spine fracture identified.  However, there is a small volume of abnormal prevertebral soft tissue fluid which is new. This is primarily about the C4 level (series 5, image 48).  Chronic calcified carotid atherosclerosis in the neck. Stable other noncontrast paraspinal soft tissues. Grossly intact visualized upper thoracic levels.  IMPRESSION: 1. New abnormal prevertebral fluid at the C4 level, suspicious for acute cervical anterior ligamentous injury in this setting. Cervical spine MRI without contrast would evaluate further. 2. No acute fracture or listhesis identified in the cervical spine. Chronic  advanced cervical spondylolisthesis and degenerative changes. 3. Stable and negative for age noncontrast CT appearance of the brain.  Electronically Signed: By: Genevie Ann M.D. On: 01/14/2015 11:54   Ct Cervical Spine Wo Contrast  01/14/2015   ADDENDUM REPORT: 01/14/2015 12:08  ADDENDUM: Study discussed by telephone with Dr. Reyne Dumas on 01/14/2015 at 1204 hours.   Electronically Signed   By: Genevie Ann M.D.   On: 01/14/2015 12:08   01/14/2015   CLINICAL DATA:  81 year old female who fell and was unconscious. Headache and cervical neck pain. Initial encounter.  EXAM: CT HEAD WITHOUT CONTRAST  CT CERVICAL SPINE WITHOUT CONTRAST  TECHNIQUE: Multidetector CT imaging of the head and cervical spine was performed following the standard protocol without intravenous contrast. Multiplanar CT image reconstructions of the cervical spine were also generated.  COMPARISON:  [facility] CT without contrast 718 2016, 01/08/2015. Cervical spine radiographs 09/05/2013. Cervical spine CT 07/10/2011.  FINDINGS: CT HEAD FINDINGS  Paranasal sinuses and mastoids appear stable and clear. Periapical left maxillary molar dental lucency appears inflammatory in nature. No calvarium fracture identified. No scalp hematoma. Orbits soft tissues appear stable.  Chronic Calcified atherosclerosis at the skull base. Cerebral volume is stable and normal for age. No ventriculomegaly. No midline shift, mass effect, or evidence of intracranial mass lesion. No acute intracranial hemorrhage identified. No evidence of cortically based acute infarction identified. Normal for age gray-white matter differentiation.  CT CERVICAL SPINE FINDINGS  Multilevel chronic cervical spondylolisthesis. Pronounced chronic anterolisthesis at C3-C4 appears stable since 2013. Mild anterolisthesis at C2-C3 and C7-T1 also appear stable. Associated chronic cervical facet arthropathy. Bilateral cervical posterior element alignment appears stable. Superimposed  severe chronic lower cervical disc and endplate with chronic endplate osteophytosis. Degeneration occipital condyles to C1 alignment appears stable. Chronic C1-C2 degeneration may be mildly progressed since 2013. Stable C1-C2 alignment degenerative odontoid tip subchondral cysts do appear increased. No acute cervical spine fracture identified.  However, there is a small volume of abnormal prevertebral soft tissue fluid which is new. This is primarily about the C4 level (series 5, image 48).  Chronic calcified carotid atherosclerosis in the neck. Stable other noncontrast paraspinal soft tissues. Grossly intact visualized upper thoracic levels.  IMPRESSION: 1. New abnormal prevertebral fluid at the C4 level, suspicious for acute cervical anterior ligamentous injury in this setting. Cervical spine MRI without contrast would evaluate further. 2. No acute fracture or listhesis identified in the cervical spine. Chronic advanced cervical spondylolisthesis and degenerative changes. 3. Stable and negative for age noncontrast CT appearance of the brain.  Electronically Signed: By: Genevie Ann M.D. On: 01/14/2015 11:54   Mr Cervical Spine Wo Contrast  01/14/2015   CLINICAL DATA:  Headaches and neck pain.  Fall 01/12/2015.  EXAM: MRI CERVICAL SPINE WITHOUT CONTRAST  TECHNIQUE: Multiplanar, multisequence MR imaging of the cervical spine was performed. No intravenous contrast was administered.  COMPARISON:  Cervical spine CT 01/14/2015 and MRI 07/22/2005  FINDINGS: Study is mildly to moderately motion  degraded despite repeating some sequences.  There is mild prevertebral STIR hyperintensity at C3-4 compatible with edema as seen on CT earlier today. No frank disruption of the anterior or posterior longitudinal ligaments is identified. No posterior soft tissue edema suggestive of posterior ligamentous complex injury is identified. No vertebral marrow edema is seen to suggest acute osseous injury.  Vertebral alignment is unchanged  from the 2007 MRI with grade 1 anterolisthesis of C2 on C3, C3 on C4, and C7 on T1. Vertebral body heights are preserved. Moderate to severe disc space narrowing is present from C4-5 to C6-7. Craniocervical junction is unremarkable. Cervical spinal cord is normal in signal. 11 mm T2 hyperintense right thyroid nodule is noted.  C2-3: Advanced right and mild left facet arthrosis, mild disc uncovering, and infolding of the ligamentum flavum result in moderate spinal stenosis, increased from the prior MRI.  C3-4: Anterolisthesis with disc uncovering and advanced bilateral facet arthrosis result in mild spinal stenosis, slightly more prominent than on the prior MRI. There is at least moderate bilateral neural foraminal stenosis, increased from prior MRI.  C4-5: Broad-based posterior disc osteophyte complex and uncovertebral spurring result in mild spinal stenosis asymmetric to the right and moderate to severe right and moderate left neural foraminal stenosis, grossly similar to the prior MRI.  C5-6: Broad-based posterior disc osteophyte complex asymmetric to the right and uncovertebral spurring results in mild spinal stenosis in severe bilateral neural foraminal stenosis, similar to the prior MRI.  C6-7: Broad-based posterior disc osteophyte complex and uncovertebral spurring result in mild spinal stenosis and moderate bilateral neural foraminal stenosis. The neural foraminal stenosis may have mildly progressed from the prior MRI.  C7-T1: Slight anterolisthesis and disc uncovering without significant stenosis, unchanged.  IMPRESSION: 1. Motion degraded examination. Mild prevertebral edema in the upper cervical spine without frank ligamentous disruption identified. 2. Multilevel cervical disc and facet degeneration as above, mildly progressed from the prior MRI.   Electronically Signed   By: Logan Bores   On: 01/14/2015 20:48   Chest Portable 1 View  01/13/2015   CLINICAL DATA:  Preop for left femur fracture  EXAM:  PORTABLE CHEST - 1 VIEW  COMPARISON:  01/12/2015  FINDINGS: Moderate to severe cardiac enlargement. Calcified aortic arch. Mild vascular congestion. No evidence of consolidation or edema. Minimal right lower lobe scarring or atelectasis.  IMPRESSION: No acute findings. Stable cardiac enlargement with vascular congestion but no pulmonary edema.   Electronically Signed   By: Skipper Cliche M.D.   On: 01/13/2015 21:50     CBC  Recent Labs Lab 01/13/15 2119 01/15/15 0400  WBC 8.3 7.1  HGB 11.2* 9.6*  HCT 36.4 31.3*  PLT 222 227  MCV 99.5 98.1  MCH 30.6 30.1  MCHC 30.8 30.7  RDW 13.7 13.6    Chemistries   Recent Labs Lab 01/13/15 2119 01/15/15 0400  NA 143 143  K 3.9 3.7  CL 104 100*  CO2 27 33*  GLUCOSE 153* 99  BUN 27* 21*  CREATININE 1.32* 1.10*  CALCIUM 8.8* 9.2  AST 24 17  ALT 18 16  ALKPHOS 71 64  BILITOT 0.9 0.8   ------------------------------------------------------------------------------------------------------------------ estimated creatinine clearance is 33.8 mL/min (by C-G formula based on Cr of 1.1). ------------------------------------------------------------------------------------------------------------------ No results for input(s): HGBA1C in the last 72 hours. ------------------------------------------------------------------------------------------------------------------ No results for input(s): CHOL, HDL, LDLCALC, TRIG, CHOLHDL, LDLDIRECT in the last 72 hours. ------------------------------------------------------------------------------------------------------------------ No results for input(s): TSH, T4TOTAL, T3FREE, THYROIDAB in the last 72 hours.  Invalid input(s): FREET3 ------------------------------------------------------------------------------------------------------------------  No results for input(s): VITAMINB12, FOLATE, FERRITIN, TIBC, IRON, RETICCTPCT in the last 72 hours.  Coagulation profile  Recent Labs Lab 01/13/15 2119  01/14/15 0448 01/14/15 1623 01/15/15 0400  INR 4.23* 4.90* 5.65* 3.34*    No results for input(s): DDIMER in the last 72 hours.  Cardiac Enzymes No results for input(s): CKMB, TROPONINI, MYOGLOBIN in the last 168 hours.  Invalid input(s): CK ------------------------------------------------------------------------------------------------------------------ Invalid input(s): POCBNP   CBG:  Recent Labs Lab 01/14/15 1230 01/14/15 1706 01/14/15 2209 01/15/15 0122 01/15/15 0801  GLUCAP 110* 97 75 68 106*       Studies: Ct Head Wo Contrast  01/14/2015   ADDENDUM REPORT: 01/14/2015 12:08  ADDENDUM: Study discussed by telephone with Dr. Reyne Dumas on 01/14/2015 at 1204 hours.   Electronically Signed   By: Genevie Ann M.D.   On: 01/14/2015 12:08   01/14/2015   CLINICAL DATA:  81 year old female who fell and was unconscious. Headache and cervical neck pain. Initial encounter.  EXAM: CT HEAD WITHOUT CONTRAST  CT CERVICAL SPINE WITHOUT CONTRAST  TECHNIQUE: Multidetector CT imaging of the head and cervical spine was performed following the standard protocol without intravenous contrast. Multiplanar CT image reconstructions of the cervical spine were also generated.  COMPARISON:  [facility] CT without contrast 718 2016, 01/08/2015. Cervical spine radiographs 09/05/2013. Cervical spine CT 07/10/2011.  FINDINGS: CT HEAD FINDINGS  Paranasal sinuses and mastoids appear stable and clear. Periapical left maxillary molar dental lucency appears inflammatory in nature. No calvarium fracture identified. No scalp hematoma. Orbits soft tissues appear stable.  Chronic Calcified atherosclerosis at the skull base. Cerebral volume is stable and normal for age. No ventriculomegaly. No midline shift, mass effect, or evidence of intracranial mass lesion. No acute intracranial hemorrhage identified. No evidence of cortically based acute infarction identified. Normal for age gray-white matter  differentiation.  CT CERVICAL SPINE FINDINGS  Multilevel chronic cervical spondylolisthesis. Pronounced chronic anterolisthesis at C3-C4 appears stable since 2013. Mild anterolisthesis at C2-C3 and C7-T1 also appear stable. Associated chronic cervical facet arthropathy. Bilateral cervical posterior element alignment appears stable. Superimposed severe chronic lower cervical disc and endplate with chronic endplate osteophytosis. Degeneration occipital condyles to C1 alignment appears stable. Chronic C1-C2 degeneration may be mildly progressed since 2013. Stable C1-C2 alignment degenerative odontoid tip subchondral cysts do appear increased. No acute cervical spine fracture identified.  However, there is a small volume of abnormal prevertebral soft tissue fluid which is new. This is primarily about the C4 level (series 5, image 48).  Chronic calcified carotid atherosclerosis in the neck. Stable other noncontrast paraspinal soft tissues. Grossly intact visualized upper thoracic levels.  IMPRESSION: 1. New abnormal prevertebral fluid at the C4 level, suspicious for acute cervical anterior ligamentous injury in this setting. Cervical spine MRI without contrast would evaluate further. 2. No acute fracture or listhesis identified in the cervical spine. Chronic advanced cervical spondylolisthesis and degenerative changes. 3. Stable and negative for age noncontrast CT appearance of the brain.  Electronically Signed: By: Genevie Ann M.D. On: 01/14/2015 11:54   Ct Cervical Spine Wo Contrast  01/14/2015   ADDENDUM REPORT: 01/14/2015 12:08  ADDENDUM: Study discussed by telephone with Dr. Reyne Dumas on 01/14/2015 at 1204 hours.   Electronically Signed   By: Genevie Ann M.D.   On: 01/14/2015 12:08   01/14/2015   CLINICAL DATA:  81 year old female who fell and was unconscious. Headache and cervical neck pain. Initial encounter.  EXAM: CT HEAD WITHOUT CONTRAST  CT CERVICAL SPINE WITHOUT  CONTRAST  TECHNIQUE: Multidetector CT imaging of  the head and cervical spine was performed following the standard protocol without intravenous contrast. Multiplanar CT image reconstructions of the cervical spine were also generated.  COMPARISON:  [facility] CT without contrast 718 2016, 01/08/2015. Cervical spine radiographs 09/05/2013. Cervical spine CT 07/10/2011.  FINDINGS: CT HEAD FINDINGS  Paranasal sinuses and mastoids appear stable and clear. Periapical left maxillary molar dental lucency appears inflammatory in nature. No calvarium fracture identified. No scalp hematoma. Orbits soft tissues appear stable.  Chronic Calcified atherosclerosis at the skull base. Cerebral volume is stable and normal for age. No ventriculomegaly. No midline shift, mass effect, or evidence of intracranial mass lesion. No acute intracranial hemorrhage identified. No evidence of cortically based acute infarction identified. Normal for age gray-white matter differentiation.  CT CERVICAL SPINE FINDINGS  Multilevel chronic cervical spondylolisthesis. Pronounced chronic anterolisthesis at C3-C4 appears stable since 2013. Mild anterolisthesis at C2-C3 and C7-T1 also appear stable. Associated chronic cervical facet arthropathy. Bilateral cervical posterior element alignment appears stable. Superimposed severe chronic lower cervical disc and endplate with chronic endplate osteophytosis. Degeneration occipital condyles to C1 alignment appears stable. Chronic C1-C2 degeneration may be mildly progressed since 2013. Stable C1-C2 alignment degenerative odontoid tip subchondral cysts do appear increased. No acute cervical spine fracture identified.  However, there is a small volume of abnormal prevertebral soft tissue fluid which is new. This is primarily about the C4 level (series 5, image 48).  Chronic calcified carotid atherosclerosis in the neck. Stable other noncontrast paraspinal soft tissues. Grossly intact visualized upper thoracic levels.  IMPRESSION: 1. New  abnormal prevertebral fluid at the C4 level, suspicious for acute cervical anterior ligamentous injury in this setting. Cervical spine MRI without contrast would evaluate further. 2. No acute fracture or listhesis identified in the cervical spine. Chronic advanced cervical spondylolisthesis and degenerative changes. 3. Stable and negative for age noncontrast CT appearance of the brain.  Electronically Signed: By: Genevie Ann M.D. On: 01/14/2015 11:54   Mr Cervical Spine Wo Contrast  01/14/2015   CLINICAL DATA:  Headaches and neck pain.  Fall 01/12/2015.  EXAM: MRI CERVICAL SPINE WITHOUT CONTRAST  TECHNIQUE: Multiplanar, multisequence MR imaging of the cervical spine was performed. No intravenous contrast was administered.  COMPARISON:  Cervical spine CT 01/14/2015 and MRI 07/22/2005  FINDINGS: Study is mildly to moderately motion degraded despite repeating some sequences.  There is mild prevertebral STIR hyperintensity at C3-4 compatible with edema as seen on CT earlier today. No frank disruption of the anterior or posterior longitudinal ligaments is identified. No posterior soft tissue edema suggestive of posterior ligamentous complex injury is identified. No vertebral marrow edema is seen to suggest acute osseous injury.  Vertebral alignment is unchanged from the 2007 MRI with grade 1 anterolisthesis of C2 on C3, C3 on C4, and C7 on T1. Vertebral body heights are preserved. Moderate to severe disc space narrowing is present from C4-5 to C6-7. Craniocervical junction is unremarkable. Cervical spinal cord is normal in signal. 11 mm T2 hyperintense right thyroid nodule is noted.  C2-3: Advanced right and mild left facet arthrosis, mild disc uncovering, and infolding of the ligamentum flavum result in moderate spinal stenosis, increased from the prior MRI.  C3-4: Anterolisthesis with disc uncovering and advanced bilateral facet arthrosis result in mild spinal stenosis, slightly more prominent than on the prior MRI.  There is at least moderate bilateral neural foraminal stenosis, increased from prior MRI.  C4-5: Broad-based posterior disc osteophyte complex and uncovertebral  spurring result in mild spinal stenosis asymmetric to the right and moderate to severe right and moderate left neural foraminal stenosis, grossly similar to the prior MRI.  C5-6: Broad-based posterior disc osteophyte complex asymmetric to the right and uncovertebral spurring results in mild spinal stenosis in severe bilateral neural foraminal stenosis, similar to the prior MRI.  C6-7: Broad-based posterior disc osteophyte complex and uncovertebral spurring result in mild spinal stenosis and moderate bilateral neural foraminal stenosis. The neural foraminal stenosis may have mildly progressed from the prior MRI.  C7-T1: Slight anterolisthesis and disc uncovering without significant stenosis, unchanged.  IMPRESSION: 1. Motion degraded examination. Mild prevertebral edema in the upper cervical spine without frank ligamentous disruption identified. 2. Multilevel cervical disc and facet degeneration as above, mildly progressed from the prior MRI.   Electronically Signed   By: Logan Bores   On: 01/14/2015 20:48   Chest Portable 1 View  01/13/2015   CLINICAL DATA:  Preop for left femur fracture  EXAM: PORTABLE CHEST - 1 VIEW  COMPARISON:  01/12/2015  FINDINGS: Moderate to severe cardiac enlargement. Calcified aortic arch. Mild vascular congestion. No evidence of consolidation or edema. Minimal right lower lobe scarring or atelectasis.  IMPRESSION: No acute findings. Stable cardiac enlargement with vascular congestion but no pulmonary edema.   Electronically Signed   By: Skipper Cliche M.D.   On: 01/13/2015 21:50      Lab Results  Component Value Date   HGBA1C 7.3 11/04/2014   HGBA1C 7..2% 06/12/2014   HGBA1C 7.0* 12/28/2013   Lab Results  Component Value Date   LDLCALC 48 11/04/2014   CREATININE 1.10* 01/15/2015       Scheduled Meds: .  amiodarone  100 mg Oral Daily  . aspirin  81 mg Oral Daily  . atorvastatin  40 mg Oral q1800  . budesonide-formoterol  1 puff Inhalation BID  .  ceFAZolin (ANCEF) IV  2 g Intravenous To SS-Surg  . chlorhexidine  60 mL Topical Once  . cholecalciferol  1,000 Units Oral Daily  . feeding supplement (GLUCERNA SHAKE)  237 mL Oral BID BM  . fluticasone  1 spray Each Nare BID  . furosemide  40 mg Oral Daily  . insulin aspart  0-5 Units Subcutaneous QHS  . insulin aspart  0-9 Units Subcutaneous TID WC  . insulin detemir  8 Units Subcutaneous q morning - 10a  . isosorbide mononitrate  60 mg Oral Daily  . LORazepam  0.5 mg Intravenous Once  . metoprolol succinate  12.5 mg Oral Daily  . NIFEdipine  30 mg Oral Daily  . pantoprazole  40 mg Oral BID  . phytonadione (VITAMIN K) IV  3 mg Intravenous Once  . potassium chloride SA  20 mEq Oral Daily  . senna-docusate  1 tablet Oral BID  . vitamin C  500 mg Oral QHS   Continuous Infusions: . sodium chloride 10 mL/hr at 01/13/15 2029    Active Problems:   PAF, recurrance this admission, on chronic Amio/ Coumadin   COPD with emphysema   Chronic respiratory failure with hypoxia   HTN (hypertension)   CAD, remote RCA PCI, subsequently noted to be occluded, last cath 8/11   DM type 2 causing CKD stage 3   Hip fracture   Preop cardiovascular exam    Time spent: 49 minutes   [facility] Pager [phone]. If 7PM-7AM, please contact night-coverage at [email or web address], password [facility] 01/15/2015, 10:43 AM  LOS: 2 days

## 2015-01-15 NOTE — Clinical Social Work Placement (Signed)
   CLINICAL SOCIAL WORK PLACEMENT  NOTE  Date:  01/15/2015  Patient Details  Name: [name] MRN: [id number] Date of Birth: Aug 25, 1927  Clinical Social Work is seeking post-discharge placement for this patient at the [place] level of care (*CSW will initial, date and re-position this form in  chart as items are completed):  Yes   Patient/family provided with [facility] Work Department's list of facilities offering this level of care within the geographic area requested by the patient (or if unable, by the patient's family).  Yes   Patient/family informed of their freedom to choose among providers that offer the needed level of care, that participate in Medicare, Medicaid or managed care program needed by the patient, have an available bed and are willing to accept the patient.  Yes   Patient/family informed of [facility]'s ownership interest in [facility] and [facility], as well as of the fact that they are under no obligation to receive care at these facilities.  PASRR submitted to EDS on       PASRR number received on       Existing PASRR number confirmed on 01/15/15     FL2 transmitted to all facilities in geographic area requested by pt/family on 01/15/15     FL2 transmitted to all facilities within larger geographic area on       Patient informed that his/her managed care company has contracts with or will negotiate with certain facilities, including the following:            Patient/family informed of bed offers received.  Patient chooses bed at       Physician recommends and patient chooses bed at      Patient to be transferred to   on  .  Patient to be transferred to facility by       Patient family notified on   of transfer.  Name of family member notified:        PHYSICIAN       Additional Comment:    _______________________________________________ Liz Beach MSW, [place], [place], [phone]

## 2015-01-15 NOTE — Addendum Note (Signed)
Addendum  created 01/15/15 1902 by Roberts Gaudy, MD   Modules edited: Orders, PRL Based Order Sets

## 2015-01-15 NOTE — Interval H&P Note (Signed)
History and Physical Interval Note:  01/15/2015 1:57 PM  [name]  has presented today for surgery, with the diagnosis of LEFT FEMUR FRACTURE  The various methods of treatment have been discussed with the patient and family. After consideration of risks, benefits and other options for treatment, the patient has consented to  Procedure(s): INTRAMEDULLARY (IM) NAIL FEMORAL LEFT  (Left) as a surgical intervention .  The patient's history has been reviewed, patient examined, no change in status, stable for surgery.  I have reviewed the patient's chart and labs.  Questions were answered to the patient's satisfaction.     Leinaala Catanese,STEVEN R

## 2015-01-15 NOTE — Progress Notes (Signed)
ANTICOAGULATION CONSULT NOTE - [place] for Warfarin (on hold) Indication: atrial fibrillation  Allergies  Allergen Reactions  . Atorvastatin Other (See Comments)     muscle aches  . Codeine Nausea And Vomiting  . Morphine Nausea And Vomiting  . Other Other (See Comments)    OPIATES cause nausea and vomiting  . Rosuvastatin Other (See Comments)    muscle aches at high doses, held as of 12/2010 due to aches  . Iohexol Other (See Comments)     Desc: unknown reaction; allergic to iodine and contrast     Patient Measurements: Weight: 151 lb 11.2 oz (68.811 kg)   Vital Signs: Temp: 98 F (36.7 C) (07/21 1215) Temp Source: Oral (07/21 1215) BP: 128/59 mmHg (07/21 1215) Pulse Rate: 89 (07/21 1215)  Labs:  Recent Labs  01/13/15 2119  01/14/15 1623 01/15/15 0400 01/15/15 1228  HGB 11.2*  --   --  9.6*  --   HCT 36.4  --   --  31.3*  --   PLT 222  --   --  227  --   LABPROT 39.7*  < > 48.6* 33.2* 21.2*  INR 4.23*  < > 5.65* 3.34* 1.84*  CREATININE 1.32*  --   --  1.10*  --   < > = values in this interval not displayed.  Estimated Creatinine Clearance: 33.8 mL/min (by C-G formula based on Cr of 1.1).   Medical History: Past Medical History  Diagnosis Date  . Hyperlipidemia   . Hypertension   . Anxiety   . GERD (gastroesophageal reflux disease)   . CAD (coronary artery disease)     Cath in 2006 showed an occluded RCA with left-to-right collaterals & otherwise noncritical CAD with EF=25-30% at that time. EF subsequently improved to normal by 2D ECHO. Cath Aug. 2011 showed unchanged anatomy. > medical therapy recommended  . Diastolic dysfunction     Grade II  . RBBB (right bundle branch block)     Chronic  . Hypoxemic respiratory failure, chronic     uses 2.5 liters of oxygen with sleep  . COPD (chronic obstructive pulmonary disease)     GOLD 4 - PFT 06/08/10 FEV1 0.93 (62%), FEV1% 60, TLC 2.84 (69%0, DLCO 54%, +BD) On home O2.  .  Secondary pulmonary hypertension   . Edema   . Angina   . Shortness of breath   . Bradycardia 08/24/2011  . Carotid artery disease     Moderate, right greater than left which we following by duplex ultrasound. [place] 12/05/11 = RIght Bulb/Prox ICA: Moderate to severe amt of fibrous plaque elevating velocities w/in prox segment of ICA. Consistent w/a 50-69% diameter reduction. Left Bulb/Prox ICA: Moderate amt of fibrous plaque slightly elevating velocities w/in the prox segment of the ICA. Consistent w/a 0-49% diameter reduction.   Marland Kitchen PAF (paroxysmal atrial fibrillation)     In the past, on coumadin  . History of stress test 07/02/09    Mild perfusion defect seen in the Basal Inferior region(s). This is consistent with an infarct/scar.  Post-stress EF=56%. Global LV systolic function is normal.  EKG is negative for ischemia.  No significant iscemia detected. Low risk scan.  . Lower extremity edema     ECHO 07/21/12 = EF 55-60%, performed for TIA. Responding to diurectics. Continue diuresis w/ a goal dry weight of <160lb. Venous Duplex 07/27/11 = Right lower extremity: no evidence of thrombus or thrombophlebitis. Essentially normal right lower extremity venous duplex Doppler evaluation.  Marland Kitchen  Carotid bruit   . Mitral valve regurgitation     Mild to moderate by ECHO 07/21/12  . Sleep-related hypoventilation   . COPD with emphysema   . Coronary atherosclerosis of unspecified type of vessel, native or graft   . PONV (postoperative nausea and vomiting)   . Family history of adverse reaction to anesthesia     "daughter gets PONV"  . CHF (congestive heart failure)     Hospitalized 08/22/11-08/26/11 with CHF secondary to diastolic dysfunction & hospitilized in 07/2012 with a similar problem. TTE 08/23/11 = normal EF.  Marland Kitchen Myocardial infarction     "she's had several" (01/13/2015)  . On home oxygen therapy     "?L; prn" (01/13/2015)  . Pneumonia ~ 2013  . Type II diabetes mellitus     goal A1C is 8, to avoid  hypoglycemia  . Sleep apnea   . Stroke     "/CT scan; ~ 2014; didn't even know she'd had it"  (01/13/2015)  . Arthritis     "all over"  . Chronic lower back pain   . Bleeds easily   . Fall during current hospitalization     "was at [facility]; fell; transferred to [facility]" (01/13/2015)    Medications:  Prescriptions prior to admission  Medication Sig Dispense Refill Last Dose  . ALPRAZolam (XANAX) 0.25 MG tablet TAKE 1/2 TO 1 TABLET 2 TIMES A DAY AS NEEDED FOR ANXIETY 30 tablet 2 Taking  . amiodarone (PACERONE) 200 MG tablet Take 0.5 tablets (100 mg total) by mouth daily. Please keep upcoming appointment 30 tablet 0 Taking  . aspirin 81 MG chewable tablet Chew 81 mg by mouth daily.   Taking  . atorvastatin (LIPITOR) 40 MG tablet Take 1 tablet (40 mg total) by mouth every evening. 30 tablet 8 Taking  . Azelastine HCl 0.15 % SOLN Place 1 spray into both nostrils 2 (two) times daily as needed (congestion).   Taking  . budesonide-formoterol (SYMBICORT) 160-4.5 MCG/ACT inhaler Inhale 1 puff into the lungs 2 (two) times daily. 3 Inhaler 2 Taking  . Cholecalciferol (VITAMIN D PO) Take 1 capsule by mouth daily.    Taking  . fluticasone (FLONASE) 50 MCG/ACT nasal spray Place 1 spray into both nostrils 2 (two) times daily. 16 g 6 Taking  . furosemide (LASIX) 40 MG tablet Take 1 tablet (40 mg total) by mouth daily. May take extra dose if weight increases 3.5 pounds in 24 hrs as needed for fluid and edema. 60 tablet 4 Taking  . glucosamine-chondroitin 500-400 MG tablet Take 1 tablet by mouth 2 (two) times daily.   Taking  . glucose blood (RELION ULTIMA TEST) test strip USE TO CHECK GLUCOSE UP TO 4 TIMES DAILY 200 each 1 Taking  . guaiFENesin (MUCINEX) 600 MG 12 hr tablet Take 2 tablets (1,200 mg total) by mouth 2 (two) times daily as needed for cough or to loosen phlegm. (Patient not taking: Reported on 01/08/2015)   Not Taking  . insulin aspart (NOVOLOG) 100 UNIT/ML injection Inject 0 to 8 units up to 3  times a day prior to meal per sliding scale 20 mL 2 Taking  . insulin detemir (LEVEMIR) 100 UNIT/ML injection Inject 0.16 mLs (16 Units total) into the skin every morning. 20 mL 2 Taking  . insulin lispro (HUMALOG KWIKPEN) 100 UNIT/ML KiwkPen Inject 0-8 Units into the skin 3 (three) times daily before meals. Per sliding scale   Not Taking  . Ipratropium-Albuterol (COMBIVENT) 20-100 MCG/ACT AERS respimat Inhale 1 puff  into the lungs every 6 (six) hours as needed for wheezing or shortness of breath. 3 Inhaler 2 Taking  . isosorbide mononitrate (IMDUR) 60 MG 24 hr tablet TAKE 1 TABLET DAILY 30 tablet 4 Taking  . metoprolol succinate (TOPROL-XL) 25 MG 24 hr tablet TAKE 1/2 TABLET DAILY 15 tablet 4 Taking  . Multiple Vitamins-Minerals (PRESERVISION/LUTEIN) CAPS Take 1 capsule by mouth daily.    Taking  . NIFEdipine (NIFEDICAL XL) 30 MG 24 hr tablet Take 1 tablet (30 mg total) by mouth daily. PATIENT NEEDS TO CONTACT OFFICE FOR ADDITIONAL REFILLS 30 tablet 0 Taking  . nitroGLYCERIN (NITROSTAT) 0.4 MG SL tablet Place 1 tablet (0.4 mg total) under the tongue every 5 (five) minutes as needed for chest pain. 25 tablet 1 Taking  . pantoprazole (PROTONIX) 40 MG tablet TAKE 1 TABLET TWICE A DAY 60 tablet 4 Taking  . potassium chloride SA (K-DUR,KLOR-CON) 20 MEQ tablet Take 1 tablet (20 mEq total) by mouth daily. 30 tablet 1   . senna-docusate (SENOKOT-S) 8.6-50 MG per tablet Take 1 tablet by mouth 2 (two) times daily.   Taking  . simethicone (MYLICON) 80 MG chewable tablet Chew 80 mg by mouth at bedtime as needed for flatulence.   Taking  . vitamin C (ASCORBIC ACID) 500 MG tablet Take 500 mg by mouth at bedtime.    Taking  . warfarin (COUMADIN) 5 MG tablet TAKE (1) TABLET DAILY AFTER SUPPER. 30 tablet 2 Taking    Assessment: 65 YOF transferred from [facility] after hip fracture.  Pt on Coumadin pta for Afib. INR remains elevated after 12.5mg  total of PO Vit K.  Pt has orders for IV Vit K and FFP to attempt to  reverse INR in order to have hip surgery this afternoon.     Goal of Therapy:  INR 2-3 Monitor platelets by anticoagulation protocol: Yes   Plan:  Hold Coumadin until further MD orders Monitor daily PT/INR Follow-up plans for anticoagulation after surgery   [name], Pharm.D., BCPS Clinical Pharmacist Pager [phone] 01/15/2015 1:04 PM

## 2015-01-15 NOTE — Progress Notes (Signed)
ANTICOAGULATION CONSULT NOTE - Follow-Up Consult  Pharmacy Consult for Warfarin Indication: atrial fibrillation  Allergies  Allergen Reactions  . Atorvastatin Other (See Comments)     muscle aches  . Codeine Nausea And Vomiting  . Morphine Nausea And Vomiting  . Other Other (See Comments)    OPIATES cause nausea and vomiting  . Rosuvastatin Other (See Comments)    muscle aches at high doses, held as of 12/2010 due to aches  . Iohexol Other (See Comments)     Desc: unknown reaction; allergic to iodine and contrast     Patient Measurements: Height: 5\' 1"  (154.9 cm) Weight: 147 lb 4.3 oz (66.8 kg) IBW/kg (Calculated) : 47.8   Vital Signs: Temp: 98.6 F (37 C) (07/21 2020) Temp Source: Axillary (07/21 2020) BP: 142/53 mmHg (07/21 1940) Pulse Rate: 83 (07/21 1950)  Labs:  Recent Labs  01/13/15 2119  01/14/15 1623 01/15/15 0400 01/15/15 1228  HGB 11.2*  --   --  9.6*  --   HCT 36.4  --   --  31.3*  --   PLT 222  --   --  227  --   LABPROT 39.7*  < > 48.6* 33.2* 21.2*  INR 4.23*  < > 5.65* 3.34* 1.84*  CREATININE 1.32*  --   --  1.10*  --   < > = values in this interval not displayed.  Estimated Creatinine Clearance: 33.3 mL/min (by C-G formula based on Cr of 1.1).   Medical History: Past Medical History  Diagnosis Date  . Hyperlipidemia   . Hypertension   . Anxiety   . GERD (gastroesophageal reflux disease)   . CAD (coronary artery disease)     Cath in 2006 showed an occluded RCA with left-to-right collaterals & otherwise noncritical CAD with EF=25-30% at that time. EF subsequently improved to normal by 2D ECHO. Cath Aug. 2011 showed unchanged anatomy. > medical therapy recommended  . Diastolic dysfunction     Grade II  . RBBB (right bundle branch block)     Chronic  . Hypoxemic respiratory failure, chronic     uses 2.5 liters of oxygen with sleep  . COPD (chronic obstructive pulmonary disease)     GOLD 4 - PFT 06/08/10 FEV1 0.93 (62%), FEV1% 60, TLC 2.84  (69%0, DLCO 54%, +BD) On home O2.  . Secondary pulmonary hypertension   . Edema   . Angina   . Shortness of breath   . Bradycardia 08/24/2011  . Carotid artery disease     Moderate, right greater than left which we following by duplex ultrasound. [place] 12/05/11 = RIght Bulb/Prox ICA: Moderate to severe amt of fibrous plaque elevating velocities w/in prox segment of ICA. Consistent w/a 50-69% diameter reduction. Left Bulb/Prox ICA: Moderate amt of fibrous plaque slightly elevating velocities w/in the prox segment of the ICA. Consistent w/a 0-49% diameter reduction.   Marland Kitchen PAF (paroxysmal atrial fibrillation)     In the past, on coumadin  . History of stress test 07/02/09    Mild perfusion defect seen in the Basal Inferior region(s). This is consistent with an infarct/scar.  Post-stress EF=56%. Global LV systolic function is normal.  EKG is negative for ischemia.  No significant iscemia detected. Low risk scan.  . Lower extremity edema     ECHO 07/21/12 = EF 55-60%, performed for TIA. Responding to diurectics. Continue diuresis w/ a goal dry weight of <160lb. Venous Duplex 07/27/11 = Right lower extremity: no evidence of thrombus or thrombophlebitis. Essentially normal right  lower extremity venous duplex Doppler evaluation.  . Carotid bruit   . Mitral valve regurgitation     Mild to moderate by ECHO 07/21/12  . Sleep-related hypoventilation   . COPD with emphysema   . Coronary atherosclerosis of unspecified type of vessel, native or graft   . PONV (postoperative nausea and vomiting)   . Family history of adverse reaction to anesthesia     "daughter gets PONV"  . CHF (congestive heart failure)     Hospitalized 08/22/11-08/26/11 with CHF secondary to diastolic dysfunction & hospitilized in 07/2012 with a similar problem. TTE 08/23/11 = normal EF.  Marland Kitchen Myocardial infarction     "she's had several" (01/13/2015)  . On home oxygen therapy     "?L; prn" (01/13/2015)  . Pneumonia ~ 2013  . Type II diabetes mellitus      goal A1C is 8, to avoid hypoglycemia  . Sleep apnea   . Stroke     "/CT scan; ~ 2014; didn't even know she'd had it"  (01/13/2015)  . Arthritis     "all over"  . Chronic lower back pain   . Bleeds easily   . Fall during current hospitalization     "was at [facility]; fell; transferred to [facility]" (01/13/2015)    Medications:  Prescriptions prior to admission  Medication Sig Dispense Refill Last Dose  . ALPRAZolam (XANAX) 0.25 MG tablet TAKE 1/2 TO 1 TABLET 2 TIMES A DAY AS NEEDED FOR ANXIETY 30 tablet 2 Taking  . amiodarone (PACERONE) 200 MG tablet Take 0.5 tablets (100 mg total) by mouth daily. Please keep upcoming appointment 30 tablet 0 Taking  . aspirin 81 MG chewable tablet Chew 81 mg by mouth daily.   Taking  . atorvastatin (LIPITOR) 40 MG tablet Take 1 tablet (40 mg total) by mouth every evening. 30 tablet 8 Taking  . Azelastine HCl 0.15 % SOLN Place 1 spray into both nostrils 2 (two) times daily as needed (congestion).   Taking  . budesonide-formoterol (SYMBICORT) 160-4.5 MCG/ACT inhaler Inhale 1 puff into the lungs 2 (two) times daily. 3 Inhaler 2 Taking  . Cholecalciferol (VITAMIN D PO) Take 1 capsule by mouth daily.    Taking  . fluticasone (FLONASE) 50 MCG/ACT nasal spray Place 1 spray into both nostrils 2 (two) times daily. 16 g 6 Taking  . furosemide (LASIX) 40 MG tablet Take 1 tablet (40 mg total) by mouth daily. May take extra dose if weight increases 3.5 pounds in 24 hrs as needed for fluid and edema. 60 tablet 4 Taking  . glucosamine-chondroitin 500-400 MG tablet Take 1 tablet by mouth 2 (two) times daily.   Taking  . glucose blood (RELION ULTIMA TEST) test strip USE TO CHECK GLUCOSE UP TO 4 TIMES DAILY 200 each 1 Taking  . guaiFENesin (MUCINEX) 600 MG 12 hr tablet Take 2 tablets (1,200 mg total) by mouth 2 (two) times daily as needed for cough or to loosen phlegm. (Patient not taking: Reported on 01/08/2015)   Not Taking  . insulin aspart (NOVOLOG) 100 UNIT/ML injection  Inject 0 to 8 units up to 3 times a day prior to meal per sliding scale 20 mL 2 Taking  . insulin detemir (LEVEMIR) 100 UNIT/ML injection Inject 0.16 mLs (16 Units total) into the skin every morning. 20 mL 2 Taking  . insulin lispro (HUMALOG KWIKPEN) 100 UNIT/ML KiwkPen Inject 0-8 Units into the skin 3 (three) times daily before meals. Per sliding scale   Not Taking  . Ipratropium-Albuterol (  COMBIVENT) 20-100 MCG/ACT AERS respimat Inhale 1 puff into the lungs every 6 (six) hours as needed for wheezing or shortness of breath. 3 Inhaler 2 Taking  . isosorbide mononitrate (IMDUR) 60 MG 24 hr tablet TAKE 1 TABLET DAILY 30 tablet 4 Taking  . metoprolol succinate (TOPROL-XL) 25 MG 24 hr tablet TAKE 1/2 TABLET DAILY 15 tablet 4 Taking  . Multiple Vitamins-Minerals (PRESERVISION/LUTEIN) CAPS Take 1 capsule by mouth daily.    Taking  . NIFEdipine (NIFEDICAL XL) 30 MG 24 hr tablet Take 1 tablet (30 mg total) by mouth daily. PATIENT NEEDS TO CONTACT OFFICE FOR ADDITIONAL REFILLS 30 tablet 0 Taking  . nitroGLYCERIN (NITROSTAT) 0.4 MG SL tablet Place 1 tablet (0.4 mg total) under the tongue every 5 (five) minutes as needed for chest pain. 25 tablet 1 Taking  . pantoprazole (PROTONIX) 40 MG tablet TAKE 1 TABLET TWICE A DAY 60 tablet 4 Taking  . potassium chloride SA (K-DUR,KLOR-CON) 20 MEQ tablet Take 1 tablet (20 mEq total) by mouth daily. 30 tablet 1   . senna-docusate (SENOKOT-S) 8.6-50 MG per tablet Take 1 tablet by mouth 2 (two) times daily.   Taking  . simethicone (MYLICON) 80 MG chewable tablet Chew 80 mg by mouth at bedtime as needed for flatulence.   Taking  . vitamin C (ASCORBIC ACID) 500 MG tablet Take 500 mg by mouth at bedtime.    Taking  . warfarin (COUMADIN) 5 MG tablet TAKE (1) TABLET DAILY AFTER SUPPER. 30 tablet 2 Taking    Assessment: 81yo female transferred from [facility] after hip fracture.  Pt on Coumadin PTA for Afib. Pt received 12.5mg  total of PO Vit K on 7/20, 3mg  IV Vit K on 7/21, and 2  units of FFP on 7/21 to reverse INR prior to hip surgery on afternoon of 7/21.  Most recent INR 1.84 this afternoon prior to surgery, H&H low, PLT WNL, no overt bleeding noted since surgery.  Expect higher doses of Coumadin will be needed in the coming days d/t Vit K and FFP pt received yesterday and today.  Pharmacy now consulted to resume Coumadin for Afib s/p hip surgery.  PTA Coumadin dose: 5mg  daily except NO coumadin on Saturdays (per pt and family)  Goal of Therapy:  INR 2-3 Monitor platelets by anticoagulation protocol: Yes   Plan:  Coumadin 7.5mg  x1 tonight Monitor daily INR, CBC, s/sx of bleeding  Drucie Opitz, PharmD Clinical Pharmacy Resident Pager: [phone] 01/15/2015 8:40 PM

## 2015-01-15 NOTE — Brief Op Note (Signed)
01/13/2015 - 01/15/2015  3:44 PM  PATIENT:  [name]  81 y.o. female  PRE-OPERATIVE DIAGNOSIS:  LEFT FEMUR FRACTURE, displaced intertroch   POST-OPERATIVE DIAGNOSIS:  LEFT FEMUR FRACTURE, displaced intertroch  PROCEDURE:  Procedure(s): INTRAMEDULLARY (IM) NAIL FEMORAL LEFT  (Left), short troch entry Biomet nail, 11 mm dia  SURGEON:  Surgeon(s) and Role:    * Netta Cedars, MD - Primary  PHYSICIAN ASSISTANT:   ASSISTANTS: Ventura Bruns, PA-C   ANESTHESIA:   general  EBL:  Total I/O In: 1589.7 [I.V.:661; Blood:928.7] Out: 950 [Urine:700; Blood:250]  BLOOD ADMINISTERED:none  DRAINS: none   LOCAL MEDICATIONS USED:  NONE  SPECIMEN:  No Specimen  DISPOSITION OF SPECIMEN:  N/A  COUNTS:  YES  TOURNIQUET:  * No tourniquets in log *  DICTATION: .Other Dictation: Dictation Number [id number]  PLAN OF CARE: Admit to inpatient   PATIENT DISPOSITION:  PACU - hemodynamically stable.   Delay start of Pharmacological VTE agent (>24hrs) due to surgical blood loss or risk of bleeding: no

## 2015-01-15 NOTE — Anesthesia Postprocedure Evaluation (Signed)
  Anesthesia Post-op Note  Patient: Soyla Bainter  Procedure(s) Performed: Procedure(s): INTRAMEDULLARY (IM) NAIL FEMORAL LEFT  (Left)  Patient Location: PACU  Anesthesia Type:General  Level of Consciousness: awake, sedated, patient cooperative and confused  Airway and Oxygen Therapy: Patient Spontanous Breathing  Post-op Pain: mild  Post-op Assessment: Post-op Vital signs reviewed, Patient's Cardiovascular Status Stable, Respiratory Function Stable, Patent Airway, No signs of Nausea or vomiting and Pain level controlled LLE Motor Response: Purposeful movement LLE Sensation: Pain RLE Motor Response: Purposeful movement RLE Sensation: Full sensation      Post-op Vital Signs: stable  Last Vitals:  Filed Vitals:   01/15/15 1553  BP: 105/72  Pulse: 78  Temp:   Resp: 17    Complications: No apparent anesthesia complications

## 2015-01-15 NOTE — Anesthesia Procedure Notes (Signed)
Procedure Name: Intubation Date/Time: 01/15/2015 2:32 PM Performed by: Shirlyn Goltz Pre-anesthesia Checklist: Patient identified, Emergency Drugs available, Suction available and Patient being monitored Patient Re-evaluated:Patient Re-evaluated prior to inductionOxygen Delivery Method: Circle system utilized Preoxygenation: Pre-oxygenation with 100% oxygen Intubation Type: IV induction Ventilation: Mask ventilation without difficulty Laryngoscope Size: Miller and 2 Grade View: Grade I Tube type: Oral Tube size: 7.0 mm Number of attempts: 1 Airway Equipment and Method: Stylet and LTA kit utilized Placement Confirmation: ETT inserted through vocal cords under direct vision,  positive ETCO2 and breath sounds checked- equal and bilateral Secured at: 21 cm Tube secured with: Tape Dental Injury: Teeth and Oropharynx as per pre-operative assessment

## 2015-01-15 NOTE — Transfer of Care (Signed)
Immediate Anesthesia Transfer of Care Note  Patient: [name]  Procedure(s) Performed: Procedure(s): INTRAMEDULLARY (IM) NAIL FEMORAL LEFT  (Left)  Patient Location: PACU  Anesthesia Type:General  Level of Consciousness: awake, alert , confused and responds to stimulation  Airway & Oxygen Therapy: Patient Spontanous Breathing and Patient connected to nasal cannula oxygen  Post-op Assessment: Report given to RN, Post -op Vital signs reviewed and stable and Patient moving all extremities  Post vital signs: Reviewed and stable  Last Vitals:  Filed Vitals:   01/15/15 1304  BP: 135/56  Pulse: 83  Temp: 36.7 C  Resp: 18    Complications: No apparent anesthesia complications

## 2015-01-15 NOTE — H&P (View-Only) (Signed)
Reason for Consult:Left hip fracture Referring Physician: Dr. [name] is an 81 y.o. female.  HPI: [name] is an 81 yo female with multiple medical problems who had been hospitalized at [facility] since late last week due to headache and mental status changes. Her daughter-in-law provided the history this evening. She states that the patient had severe confusion from medications and was clearing mentally as of yesterday. She was transferred to a new room yesterday and tried to get out of bed last evening. She apparently fell and the family was called as the patient sustained a hip fracture in the fall. She was ambulatory with a walker and lived independently at home before her recent hospitalization. Once learning about the hip fracture, the family requested transfer to [name] as her cardiologist and Orthopaedic group ( [name]) are here. She states that the patient has been sedate again today due to analgesics for the hip fracture but that she was able to recite, person, place and time upon arrival here.  Past Medical History  Diagnosis Date  . Hyperlipidemia   . Hypertension   . Anxiety   . GERD (gastroesophageal reflux disease)   . CAD (coronary artery disease)     Cath in 2006 showed an occluded RCA with left-to-right collaterals & otherwise noncritical CAD with EF=25-30% at that time. EF subsequently improved to normal by 2D ECHO. Cath Aug. 2011 showed unchanged anatomy. > medical therapy recommended  . Diastolic dysfunction     Grade II  . RBBB (right bundle branch block)     Chronic  . Paroxysmal atrial fibrillation   . Hypoxemic respiratory failure, chronic     uses 2.5 liters of oxygen with sleep  . COPD (chronic obstructive pulmonary disease)     GOLD 4 - PFT 06/08/10 FEV1 0.93 (62%), FEV1% 60, TLC 2.84 (69%0, DLCO 54%, +BD) On home O2.  . Secondary pulmonary hypertension   . Edema   . Angina   . Shortness of breath   . CHF (congestive heart  failure)   . Diabetes mellitus     Type II - goal A1C is 8, to avoid hypoglycemia  . Bradycardia 08/24/2011  . Carotid artery disease     Moderate, right greater than left which we following by duplex ultrasound. [place] 12/05/11 = RIght Bulb/Prox ICA: Moderate to severe amt of fibrous plaque elevating velocities w/in prox segment of ICA. Consistent w/a 50-69% diameter reduction. Left Bulb/Prox ICA: Moderate amt of fibrous plaque slightly elevating velocities w/in the prox segment of the ICA. Consistent w/a 0-49% diameter reduction.   Marland Kitchen PAF (paroxysmal atrial fibrillation)     In the past, on coumadin  . CHF (congestive heart failure)     Hospitalized 08/22/11-08/26/11 with CHF secondary to diastolic dysfunction & hospitilized in 07/2012 with a similar problem. TTE 08/23/11 = normal EF.  Marland Kitchen History of stress test 07/02/09    Mild perfusion defect seen in the Basal Inferior region(s). This is consistent with an infarct/scar.  Post-stress EF=56%. Global LV systolic function is normal.  EKG is negative for ischemia.  No significant iscemia detected. Low risk scan.  . Lower extremity edema     ECHO 07/21/12 = EF 55-60%, performed for TIA. Responding to diurectics. Continue diuresis w/ a goal dry weight of <160lb. Venous Duplex 07/27/11 = Right lower extremity: no evidence of thrombus or thrombophlebitis. Essentially normal right lower extremity venous duplex Doppler evaluation.  . Carotid bruit   . Mitral valve regurgitation  Mild to moderate by ECHO 07/21/12  . Sleep-related hypoventilation   . COPD with emphysema   . Coronary atherosclerosis of unspecified type of vessel, native or graft   . Cataract     Past Surgical History  Procedure Laterality Date  . Rotator cuff repair  07/2005    right  . Cardiac catheterization  2006 & 2011    Cath in 2006 showed an occluded RCA with left-to-right collaterals & otherwise noncritical CAD with EF=25-30% at that time. EF subsequently improved to normal by 2D ECHO.  Cath Aug. 2011 showed unchanged anatomy. > medical therapy recommended.  . Coronary angioplasty    . Coronary angioplasty with stent placement      x2  . Carotid duplex  12/05/11    Moderate, right greater than left which we following by duplex ultrasound. [place] 12/05/11 = RIght Bulb/Prox ICA: Moderate to severe amt of fibrous plaque elevating velocities w/in prox segment of ICA. Consistent w/a 50-69% diameter reduction. Left Bulb/Prox ICA: Moderate amt of fibrous plaque slightly elevating velocities w/in the prox segment of the ICA. Consistent w/a 0-49% diameter reduction.   . Venous duplex  07/27/11    Venous Duplex 07/27/11 = Right lower extremity: no evidence of thrombus or thrombophlebitis. Essentially normal right lower extremity venous duplex Doppler evaluation.  . Abdominal hysterectomy      complete  . Transvaginal tape (tvt) removal    . Eye surgery      Family History  Problem Relation Age of Onset  . Cancer Mother     Skin  . Heart disease Mother   . Emphysema Father   . Heart disease Father   . Congestive Heart Failure Father   . Heart disease Brother   . Heart disease Brother   . Early death Sister     Social History:  reports that she quit smoking about 28 years ago. Her smoking use included Cigarettes. She has a 22.5 pack-year smoking history. She has never used smokeless tobacco. She reports that she drinks alcohol. She reports that she does not use illicit drugs.  Allergies:  Allergies  Allergen Reactions  . Atorvastatin Other (See Comments)     muscle aches  . Codeine Nausea And Vomiting  . Morphine Nausea And Vomiting  . Other Other (See Comments)    OPIATES cause nausea and vomiting  . Rosuvastatin Other (See Comments)    muscle aches at high doses, held as of 12/2010 due to aches  . Iohexol Other (See Comments)     Desc: unknown reaction; allergic to iodine and contrast     Medications: I have reviewed the patient's current medications.  Results for orders  placed or performed during the hospital encounter of 01/13/15 (from the past 48 hour(s))  Glucose, capillary     Status: Abnormal   Collection Time: 01/13/15  6:30 PM  Result Value Ref Range   Glucose-Capillary 125 (H) 65 - 99 mg/dL   Comment 1 Notify RN    Comment 2 Document in Chart     No results found.  Review of Systems  Constitutional: Negative.   HENT: Positive for hearing loss.   Eyes: Negative.   Respiratory: Negative.   Cardiovascular: Negative.   Gastrointestinal: Negative.   Genitourinary: Negative.   Musculoskeletal: Positive for joint pain and falls.  Skin: Negative.   Neurological: Positive for headaches.  Endo/Heme/Allergies: Negative.   Psychiatric/Behavioral: Negative.    Blood pressure 149/63, pulse 78, temperature 97.8 F (36.6 C), temperature source Oral, resp.  rate 15, SpO2 96 %. Physical Exam  Wd female, sedated but easily arousible in NAD LLE not shortened or rotated EHL/FHL/TA/Gastroc intact 2 + DP pulse  Radiographs reviewed from [facility] on a disc show a mildly displaced left intertrochanteric femur fracture with displacement of the lesser trochanter  Assessment/Plan: Left intertrochanteric femur fracture- Patient will require operative fixation but needs medical clearance first and her coags need to normalize or near normalize prior to surgery. Will need cardiac clearance also. Would let her INR drift back to normal and not do Vitamin K as she is fairly comfortable and neurovascularly intact. I anticipate that Thursday would be the earliest that she could potentially undergo surgical treatment.  [name] 01/13/2015, 8:48 PM

## 2015-01-15 NOTE — Addendum Note (Signed)
Addendum  created 01/15/15 1629 by Kate Sable, MD   Modules edited: Orders, PRL Based Order Sets

## 2015-01-16 ENCOUNTER — Encounter (HOSPITAL_COMMUNITY): Payer: Self-pay | Admitting: Orthopedic Surgery

## 2015-01-16 LAB — CBC
HCT: 28.2 % — ABNORMAL LOW (ref 36.0–46.0)
HCT: 23.5 % — ABNORMAL LOW (ref 36.0–46.0)
Hemoglobin: 8.8 g/dL — ABNORMAL LOW (ref 12.0–15.0)
Hemoglobin: 7.4 g/dL — ABNORMAL LOW (ref 12.0–15.0)
MCH: 30.4 pg (ref 26.0–34.0)
MCH: 30.5 pg (ref 26.0–34.0)
MCHC: 31.2 g/dL (ref 30.0–36.0)
MCHC: 31.5 g/dL (ref 30.0–36.0)
MCV: 97.6 fL (ref 78.0–100.0)
MCV: 96.7 fL (ref 78.0–100.0)
Platelets: 218 10*3/uL (ref 150–400)
Platelets: 200 10*3/uL (ref 150–400)
RBC: 2.89 MIL/uL — ABNORMAL LOW (ref 3.87–5.11)
RBC: 2.43 MIL/uL — AB (ref 3.87–5.11)
RDW: 13.6 % (ref 11.5–15.5)
RDW: 13.5 % (ref 11.5–15.5)
WBC: 9 10*3/uL (ref 4.0–10.5)
WBC: 8.2 10*3/uL (ref 4.0–10.5)

## 2015-01-16 LAB — PREPARE FRESH FROZEN PLASMA
Unit division: 0
Unit division: 0

## 2015-01-16 LAB — COMPREHENSIVE METABOLIC PANEL
ALT: 16 U/L (ref 14–54)
ANION GAP: 11 (ref 5–15)
AST: 22 U/L (ref 15–41)
Albumin: 2.7 g/dL — ABNORMAL LOW (ref 3.5–5.0)
Alkaline Phosphatase: 63 U/L (ref 38–126)
BILIRUBIN TOTAL: 0.7 mg/dL (ref 0.3–1.2)
BUN: 16 mg/dL (ref 6–20)
CHLORIDE: 98 mmol/L — AB (ref 101–111)
CO2: 34 mmol/L — AB (ref 22–32)
Calcium: 8.8 mg/dL — ABNORMAL LOW (ref 8.9–10.3)
Creatinine, Ser: 1.06 mg/dL — ABNORMAL HIGH (ref 0.44–1.00)
GFR calc Af Amer: 54 mL/min — ABNORMAL LOW (ref >60)
GFR calc non Af Amer: 47 mL/min — ABNORMAL LOW (ref >60)
Glucose, Bld: 101 mg/dL — ABNORMAL HIGH (ref 65–99)
Potassium: 3.4 mmol/L — ABNORMAL LOW (ref 3.5–5.1)
SODIUM: 143 mmol/L (ref 135–145)
Total Protein: 6.3 g/dL — ABNORMAL LOW (ref 6.5–8.1)

## 2015-01-16 LAB — PROTIME-INR
INR: 1.24 (ref 0.00–1.49)
Prothrombin Time: 15.8 seconds — ABNORMAL HIGH (ref 11.6–15.2)

## 2015-01-16 LAB — GLUCOSE, CAPILLARY
GLUCOSE-CAPILLARY: 236 mg/dL — AB (ref 65–99)
GLUCOSE-CAPILLARY: 188 mg/dL — AB (ref 65–99)
Glucose-Capillary: 126 mg/dL — ABNORMAL HIGH (ref 65–99)
Glucose-Capillary: 141 mg/dL — ABNORMAL HIGH (ref 65–99)

## 2015-01-16 LAB — TROPONIN I: Troponin I: 0.27 ng/mL — ABNORMAL HIGH (ref <0.031)

## 2015-01-16 LAB — PREPARE RBC (CROSSMATCH)

## 2015-01-16 LAB — MRSA PCR SCREENING: MRSA by PCR: NEGATIVE

## 2015-01-16 MED ORDER — SODIUM CHLORIDE 0.9 % IV BOLUS (SEPSIS)
500.0000 mL | Freq: Once | INTRAVENOUS | Status: AC
  Administered 2015-01-16: 500 mL via INTRAVENOUS

## 2015-01-16 MED ORDER — LORAZEPAM 2 MG/ML IJ SOLN
0.5000 mg | INTRAMUSCULAR | Status: DC
  Administered 2015-01-16 (×2): 0.5 mg via INTRAVENOUS
  Filled 2015-01-16 (×2): qty 1

## 2015-01-16 MED ORDER — POTASSIUM CHLORIDE 10 MEQ/100ML IV SOLN
10.0000 meq | INTRAVENOUS | Status: AC
  Administered 2015-01-16 (×4): 10 meq via INTRAVENOUS
  Filled 2015-01-16 (×5): qty 100

## 2015-01-16 MED ORDER — WARFARIN SODIUM 7.5 MG PO TABS
7.5000 mg | ORAL_TABLET | Freq: Once | ORAL | Status: AC
  Filled 2015-01-16: qty 1

## 2015-01-16 MED ORDER — SODIUM CHLORIDE 0.9 % IV SOLN: Freq: Once | INTRAVENOUS | Status: DC

## 2015-01-16 MED ORDER — SODIUM CHLORIDE 0.9 % IV BOLUS (SEPSIS)
250.0000 mL | Freq: Once | INTRAVENOUS | Status: AC
  Administered 2015-01-16: 250 mL via INTRAVENOUS

## 2015-01-16 MED ORDER — ENOXAPARIN SODIUM 40 MG/0.4ML ~~LOC~~ SOLN
40.0000 mg | SUBCUTANEOUS | Status: DC
  Administered 2015-01-16 – 2015-01-17 (×2): 40 mg via SUBCUTANEOUS
  Filled 2015-01-16 (×3): qty 0.4

## 2015-01-16 MED ORDER — FENTANYL CITRATE (PF) 100 MCG/2ML IJ SOLN
25.0000 ug | INTRAMUSCULAR | Status: DC | PRN
  Administered 2015-01-16 – 2015-01-17 (×3): 25 ug via INTRAVENOUS
  Filled 2015-01-16 (×3): qty 2

## 2015-01-16 MED ORDER — FUROSEMIDE 40 MG PO TABS
40.0000 mg | ORAL_TABLET | Freq: Every day | ORAL | Status: DC
  Administered 2015-01-16: 40 mg via ORAL
  Filled 2015-01-16: qty 1

## 2015-01-16 MED ORDER — WARFARIN SODIUM 7.5 MG PO TABS
7.5000 mg | ORAL_TABLET | Freq: Once | ORAL | Status: AC
  Administered 2015-01-16: 7.5 mg via ORAL
  Filled 2015-01-16: qty 1

## 2015-01-16 NOTE — Progress Notes (Signed)
    Chart reviewed & I briefly evaluated the patent & talked with family.  Afib rate controlled on Amio & BB -- restart warfarin per Ortho.  IV Lasix last PM - probably related to intra-op IVF -- seems euvolemic, would simply restart home meds.  As there does not appear to be active cardiac concerns - will sign off.   Will be available as needed for assistance.  Leonie Man, M.D., M.S. Interventional Cardiologist   Pager # [phone]

## 2015-01-16 NOTE — Evaluation (Addendum)
Clinical/Bedside Swallow Evaluation Patient Details  Name: [name] MRN: [id number] Date of Birth: [birth year]  Today's Date: 01/16/2015 Time: SLP Start Time (ACUTE ONLY): 6789 SLP Stop Time (ACUTE ONLY): 0843 SLP Time Calculation (min) (ACUTE ONLY): 19 min  Past Medical History:  Past Medical History  Diagnosis Date  . Hyperlipidemia   . Hypertension   . Anxiety   . GERD (gastroesophageal reflux disease)   . CAD (coronary artery disease)     Cath in 2006 showed an occluded RCA with left-to-right collaterals & otherwise noncritical CAD with EF=25-30% at that time. EF subsequently improved to normal by 2D ECHO. Cath Aug. 2011 showed unchanged anatomy. > medical therapy recommended  . Diastolic dysfunction     Grade II  . RBBB (right bundle branch block)     Chronic  . Hypoxemic respiratory failure, chronic     uses 2.5 liters of oxygen with sleep  . COPD (chronic obstructive pulmonary disease)     GOLD 4 - PFT 06/08/10 FEV1 0.93 (62%), FEV1% 60, TLC 2.84 (69%0, DLCO 54%, +BD) On home O2.  . Secondary pulmonary hypertension   . Edema   . Angina   . Shortness of breath   . Bradycardia 08/24/2011  . Carotid artery disease     Moderate, right greater than left which we following by duplex ultrasound. [place] 12/05/11 = RIght Bulb/Prox ICA: Moderate to severe amt of fibrous plaque elevating velocities w/in prox segment of ICA. Consistent w/a 50-69% diameter reduction. Left Bulb/Prox ICA: Moderate amt of fibrous plaque slightly elevating velocities w/in the prox segment of the ICA. Consistent w/a 0-49% diameter reduction.   Marland Kitchen PAF (paroxysmal atrial fibrillation)     In the past, on coumadin  . History of stress test 07/02/09    Mild perfusion defect seen in the Basal Inferior region(s). This is consistent with an infarct/scar.  Post-stress EF=56%. Global LV systolic function is normal.  EKG is negative for ischemia.  No significant iscemia detected. Low risk scan.  . Lower extremity edema      ECHO 07/21/12 = EF 55-60%, performed for TIA. Responding to diurectics. Continue diuresis w/ a goal dry weight of <160lb. Venous Duplex 07/27/11 = Right lower extremity: no evidence of thrombus or thrombophlebitis. Essentially normal right lower extremity venous duplex Doppler evaluation.  . Carotid bruit   . Mitral valve regurgitation     Mild to moderate by ECHO 07/21/12  . Sleep-related hypoventilation   . COPD with emphysema   . Coronary atherosclerosis of unspecified type of vessel, native or graft   . PONV (postoperative nausea and vomiting)   . Family history of adverse reaction to anesthesia     "daughter gets PONV"  . CHF (congestive heart failure)     Hospitalized 08/22/11-08/26/11 with CHF secondary to diastolic dysfunction & hospitilized in 07/2012 with a similar problem. TTE 08/23/11 = normal EF.  Marland Kitchen Myocardial infarction     "she's had several" (01/13/2015)  . On home oxygen therapy     "?L; prn" (01/13/2015)  . Pneumonia ~ 2013  . Type II diabetes mellitus     goal A1C is 8, to avoid hypoglycemia  . Sleep apnea   . Stroke     "/CT scan; ~ 2014; didn't even know she'd had it"  (01/13/2015)  . Arthritis     "all over"  . Chronic lower back pain   . Bleeds easily   . Fall during current hospitalization     "was at [facility]; fell; transferred  to [facility]" (01/13/2015)   Past Surgical History:  Past Surgical History  Procedure Laterality Date  . Shoulder open rotator cuff repair Right 07/2005  . Cardiac catheterization  2006 & 2011    Cath in 2006 showed an occluded RCA with left-to-right collaterals & otherwise noncritical CAD with EF=25-30% at that time. EF subsequently improved to normal by 2D ECHO. Cath Aug. 2011 showed unchanged anatomy. > medical therapy recommended.  . Coronary angioplasty    . Coronary angioplasty with stent placement      x2  . Carotid duplex  12/05/11    Moderate, right greater than left which we following by duplex ultrasound. [place] 12/05/11 = RIght Bulb/Prox  ICA: Moderate to severe amt of fibrous plaque elevating velocities w/in prox segment of ICA. Consistent w/a 50-69% diameter reduction. Left Bulb/Prox ICA: Moderate amt of fibrous plaque slightly elevating velocities w/in the prox segment of the ICA. Consistent w/a 0-49% diameter reduction.   . Venous duplex  07/27/11    Venous Duplex 07/27/11 = Right lower extremity: no evidence of thrombus or thrombophlebitis. Essentially normal right lower extremity venous duplex Doppler evaluation.  . Transvaginal tape (tvt) removal    . Cataract extraction w/ intraocular lens  implant, bilateral Bilateral   . Tonsillectomy    . Total abdominal hysterectomy  ~ 1967   HPI:  81 yo female with PMH: GERD, anxiety, COPD, SOB, CHF, bradycardia, HTN admitted following fall sustaining hip fx at [facility] where she was admitted due to headache and severe confusion Family requested transfer to [facility]. Underwent IM left hip 7/21. MRI of spine revealed Mild prevertebral edema in the upper   Assessment / Plan / Recommendation Clinical Impression  Pt sleepy/drowsy/affects of anesthesia suspected. Spoke to SLP with eyes closed and followed most directions with repetition, however mild confusion persists. Coughing with large consecutive sips water, audible swallow. Max tactile cues to remove cup from pt's hand. Prognosis for unrestricted diet excellent when alertness increases. Recommend Dys 1, nectar, crush pills and full assist. Will continue to follow and upgrade diet when alertness/awareness improves.    Aspiration Risk  Moderate    Diet Recommendation Dysphagia 1 (Puree);Nectar   Medication Administration: Crushed with puree Compensations: Small sips/bites;Slow rate    Other  Recommendations Oral Care Recommendations: Oral care BID   Follow Up Recommendations       Frequency and Duration min 2x/week  2 weeks   Pertinent Vitals/Pain Leg pain, SLP removed pillow from left leg with relief          Swallow Study        Oral/Motor/Sensory Function Overall Oral Motor/Sensory Function:  (decre cooperativeness)   Ice Chips Ice chips: Impaired Presentation: Spoon Oral Phase Impairments: Reduced lingual movement/coordination Oral Phase Functional Implications: Prolonged oral transit   Thin Liquid Thin Liquid: Impaired Presentation: Cup;Spoon Oral Phase Impairments: Reduced lingual movement/coordination Pharyngeal  Phase Impairments: Cough - Immediate (audible swallow)    Nectar Thick Nectar Thick Liquid: Not tested   Honey Thick Honey Thick Liquid: Not tested   Puree Puree: Within functional limits   Solid   GO    Solid: Impaired Oral Phase Impairments: Reduced lingual movement/coordination;Impaired anterior to posterior transit Oral Phase Functional Implications:  (prolonged transit)       Jorden Minchey, Orbie Pyo 01/16/2015,8:57 AM  Orbie Pyo [employer]vin [employer] [phone]

## 2015-01-16 NOTE — Progress Notes (Signed)
Speech Language Pathology Treatment: Dysphagia  Patient Details Name: Navpreet Szczygiel MRN: [id number] DOB: 1927-12-31 Today's Date: 01/16/2015 Time: 4010-2725 SLP Time Calculation (min) (ACUTE ONLY): 12 min  Assessment / Plan / Recommendation Clinical Impression  SLP returned for second visit to determine if diet and liquid upgrade is safe and appropriate. OT invited SLP in for end of session. Pt more alert and conversive, eyes open majority of session. Cup and straw sips thin did not reveal s/s aspiration. Increased manipulation with solid texture however, continued prolonged transit. Required mod-max verbal and tactile assist for small sips. Upgraded diet to Dys 2 and thin, small sips with straw, pills whole in applesauce, full supervision and remain upright 30 min after meals due to hx GERD.   HPI Other Pertinent Information: 81 yo female with PMH: GERD, anxiety, COPD, SOB, CHF, bradycardia, HTN admitted following fall sustaining hip fx at [facility] where she was admitted due to headache and severe confusion Family requested transfer to [facility]. Underwent IM left hip 7/21. MRI of spine revealed Mild prevertebral edema in the upper   Pertinent Vitals Pain Assessment: No/denies pain Faces Pain Scale: Hurts even more Pain Location: L hip Pain Descriptors / Indicators: Aching Pain Intervention(s): Limited activity within patient's tolerance;Monitored during session;Premedicated before session;Repositioned  SLP Plan  Continue with current plan of care    Recommendations Diet recommendations: Dysphagia 2 (fine chop);Thin liquid Liquids provided via: Cup;Straw Medication Administration: Whole meds with puree Supervision: Patient able to self feed;Full supervision/cueing for compensatory strategies Compensations: Small sips/bites;Slow rate;Check for pocketing Postural Changes and/or Swallow Maneuvers: Seated upright 90 degrees;Upright 30-60 min after meal              Oral Care  Recommendations:  (TBD) Follow up Recommendations:  (TBD) Plan: Continue with current plan of care    GO     [name] 01/16/2015, 3:02 PM  Orbie Pyo [employer] [phone]

## 2015-01-16 NOTE — Care Management Important Message (Signed)
Important Message  Patient Details  Name: [name] MRN: [id number] Date of Birth: October 09, 1927   Medicare Important Message Given:  Yes-second notification given    Pricilla Handler 01/16/2015, 12:23 PM

## 2015-01-16 NOTE — Progress Notes (Signed)
BP 70/40. Spoke with MD. Will place order for 250cc bolus and will d/c metoprolol and lasix for now. Dorena Cookey, RN

## 2015-01-16 NOTE — Evaluation (Signed)
Occupational Therapy Evaluation Patient Details Name: [name] MRN: [id number] DOB: 15-Mar-1928 Today's Date: 01/16/2015    History of Present Illness Pt admitted to [facility]7/14 with UTI, fall 7/18 with Left intertrochanteric femur fx, transfer to [facility] with IM nail. PMHx: CAD, CHF, AFib, legally blind   Clinical Impression   Pt was functioning at a modified independent level despite her low vision prior to admission.  Pt presents with impaired cognition, decreased balance, generalized weakness and dependence in all ADL.  Pt will need post acute rehab prior to return home.  Will follow acutely.    Follow Up Recommendations  SNF;Supervision/Assistance - 24 hour    Equipment Recommendations       Recommendations for Other Services       Precautions / Restrictions Precautions Precautions: Fall Restrictions Weight Bearing Restrictions: Yes LLE Weight Bearing: Partial weight bearing LLE Partial Weight Bearing Percentage or Pounds: 25%      Mobility Bed Mobility Overal bed mobility: Needs Assistance;+2 for physical assistance;+ 2 for safety/equipment Bed Mobility: Supine to Sit;Sit to Supine     Supine to sit: Max assist;+2 for physical assistance Sit to supine: +2 for physical assistance;Max assist   General bed mobility comments: assist for LEs and trunk, use of pad to shift hips, +2 to pull up in bed  Transfers                      Balance Overall balance assessment: Needs assistance Sitting-balance support: Feet supported Sitting balance-Leahy Scale: Poor                                      ADL Overall ADL's : Needs assistance/impaired Eating/Feeding: Maximal assistance;Sitting Eating/Feeding Details (indicate cue type and reason): assisted with lunch meal, can drink when cup placed in hand, can bring spoon to mouth once loaded Grooming: Brushing hair;Oral care;Maximal assistance;Bed level   Upper Body Bathing: Maximal  assistance;Bed level   Lower Body Bathing: Total assistance;Bed level   Upper Body Dressing : Maximal assistance;Sitting   Lower Body Dressing: Total assistance;Bed level               Functional mobility during ADLs:  (not ambulatory)       Vision     Perception     Praxis      Pertinent Vitals/Pain Pain Assessment: No/denies pain Faces Pain Scale: Hurts even more Pain Location: L hip Pain Descriptors / Indicators: Aching Pain Intervention(s): Limited activity within patient's tolerance;Monitored during session;Premedicated before session;Repositioned     Hand Dominance Right   Extremity/Trunk Assessment Upper Extremity Assessment Upper Extremity Assessment: Generalized weakness (arthritic changes in hands)   Lower Extremity Assessment Lower Extremity Assessment: Defer to PT evaluation       Communication Communication Communication: No difficulties   Cognition Arousal/Alertness: Awake/alert Behavior During Therapy: WFL for tasks assessed/performed Overall Cognitive Status: Impaired/Different from baseline Area of Impairment: Orientation;Attention;Memory;Following commands;Safety/judgement;Awareness;Problem solving Orientation Level: Disoriented to;Place;Time;Situation Current Attention Level: Sustained Memory: Decreased short-term memory Following Commands: Follows one step commands inconsistently Safety/Judgement: Decreased awareness of safety;Decreased awareness of deficits   Problem Solving: Slow processing;Decreased initiation;Difficulty sequencing;Requires verbal cues;Requires tactile cues General Comments: pt pleasant and grateful, perseverative about seeing people in her room last night   General Comments       Exercises       Shoulder Instructions      Home Living Family/patient expects to be  discharged to:: Private residence Living Arrangements: Alone Available Help at Discharge: Family;Available PRN/intermittently Type of Home:  Apartment Home Access: Level entry     Home Layout: One level     Bathroom Shower/Tub: Teacher, early years/pre: Standard     Home Equipment: Environmental consultant - 4 wheels;Cane - single point;Shower seat   Additional Comments: Pt plans on SNF at [facility] for D/C.       Prior Functioning/Environment Level of Independence: Independent with assistive device(s)        Comments: friend helped her read her mail/pay bills    OT Diagnosis: Generalized weakness;Cognitive deficits;Disturbance of vision;Blindness and low vision;Acute pain   OT Problem List: Decreased strength;Decreased activity tolerance;Impaired balance (sitting and/or standing);Impaired vision/perception;Decreased knowledge of use of DME or AE;Decreased cognition;Decreased safety awareness;Pain;Impaired UE functional use   OT Treatment/Interventions: Self-care/ADL training;Therapeutic exercise;DME and/or AE instruction;Therapeutic activities;Cognitive remediation/compensation;Patient/family education;Balance training    OT Goals(Current goals can be found in the care plan section) Acute Rehab OT Goals Patient Stated Goal: return to normal function and walking OT Goal Formulation: With patient Time For Goal Achievement: 01/30/15 Potential to Achieve Goals: Good ADL Goals Pt Will Perform Eating: with supervision;sitting;bed level Pt Will Perform Grooming: with min assist;sitting Pt Will Transfer to Toilet: with mod assist;stand pivot transfer;bedside commode Pt/caregiver will Perform Home Exercise Program: Increased strength;Both right and left upper extremity;With minimal assist (AROM) Additional ADL Goal #1: Pt will perform bed mobility with mod assist in preparation for ADL. Additional ADL Goal #2: Pt will demonstrate selective attention during ADL.  OT Frequency: Min 2X/week   Barriers to D/C:            Co-evaluation              End of Session    Activity Tolerance: Patient tolerated treatment  well Patient left: in bed;with call bell/phone within reach;with bed alarm set;with family/visitor present   Time: 5038-8828 OT Time Calculation (min): 42 min Charges:  OT General Charges $OT Visit: 1 Procedure OT Evaluation $Initial OT Evaluation Tier I: 1 Procedure OT Treatments $Self Care/Home Management : 23-37 mins G-Codes:    Malka So 01/16/2015, 3:08 PM

## 2015-01-16 NOTE — Progress Notes (Signed)
Orthopedics Progress Note  Subjective: Patient complaining of pain during transfer  Objective:  Filed Vitals:   01/16/15 0743  BP: 150/70  Pulse: 89  Temp: 98.2 F (36.8 C)  Resp: 17    General: Awake and alert  Musculoskeletal: left hip dressing clean and dry and intact, NVI l LE Neurovascularly intact  Lab Results  Component Value Date   WBC 9.0 01/16/2015   HGB 8.8* 01/16/2015   HCT 28.2* 01/16/2015   MCV 97.6 01/16/2015   PLT 218 01/16/2015       Component Value Date/Time   NA 143 01/16/2015 0302   NA 146* 11/04/2014 1210   K 3.4* 01/16/2015 0302   CL 98* 01/16/2015 0302   CO2 34* 01/16/2015 0302   GLUCOSE 101* 01/16/2015 0302   GLUCOSE 124* 11/04/2014 1210   BUN 16 01/16/2015 0302   BUN 31* 11/04/2014 1210   CREATININE 1.06* 01/16/2015 0302   CALCIUM 8.8* 01/16/2015 0302   GFRNONAA 47* 01/16/2015 0302   GFRAA 54* 01/16/2015 0302    Lab Results  Component Value Date   INR 1.24 01/16/2015   INR 1.84* 01/15/2015   INR 3.34* 01/15/2015    Assessment/Plan: POD #1s/p Procedure(s): INTRAMEDULLARY (IM) NAIL FEMORAL LEFT  Stable overnight.  Patient will need SNF rehab as she is two person max assist at this point PT,OT DVT prophylaxis  Doran Heater. Veverly Fells, MD 01/16/2015 10:17 AM

## 2015-01-16 NOTE — Progress Notes (Signed)
Pt [facility] of [facility] able to [facility] pt.  Miqu[facility] Dunn to complete paperwork on 7/22 and is able to accept pt over the weekend if medically stable  CSW will continue to follow.  Domenica Reamer, [place] Social Worker [phone]

## 2015-01-16 NOTE — Evaluation (Signed)
Physical Therapy Evaluation Patient Details Name: [name] MRN: [id number] DOB: 1927-10-07 Today's Date: 01/16/2015   History of Present Illness  Pt admitted to [facility]/14 with UTI, fall 7/18 with Left intertrochanteric femur fx, transfer to [facility] with IM nail. PMHx: CAD, CHF, AFib  Clinical Impression  Pt confused on arrival with no awareness of place, situation or time. Pt confabulatory and reporting hallucinations from the night. Pt agitated but not striking staff. Pt with decreased ability to follow commands or mobilize at this time. Pt will benefit from acute therapy to maximize mobility, function, gait and strength to decrease burden of care and fall risk.     Follow Up Recommendations SNF;Supervision/Assistance - 24 hour    Equipment Recommendations  None recommended by PT    Recommendations for Other Services       Precautions / Restrictions Precautions Precautions: Fall Restrictions Weight Bearing Restrictions: Yes LLE Weight Bearing: Partial weight bearing LLE Partial Weight Bearing Percentage or Pounds: 25%      Mobility  Bed Mobility Overal bed mobility: Needs Assistance;+2 for physical assistance;+ 2 for safety/equipment Bed Mobility: Supine to Sit     Supine to sit: Max assist;+2 for physical assistance;+2 for safety/equipment     General bed mobility comments: max assist to pivot legs to EOB, max +2 with pad to shift pelvis to EOB and mod assist to raise trunk from surface  Transfers Overall transfer level: Needs assistance   Transfers: Lateral/Scoot Transfers          Lateral/Scoot Transfers: Max assist;+2 physical assistance;+2 safety/equipment General transfer comment: max +2 bed to chair with drop arm and lift pad to slide bed to chair with max cues. RN, grandson and Dr. Veverly Fells all present for transfer  Ambulation/Gait                Stairs            Wheelchair Mobility    Modified Rankin (Stroke Patients Only)        Balance Overall balance assessment: Needs assistance   Sitting balance-Leahy Scale: Poor                                       Pertinent Vitals/Pain Pain Assessment: Faces Pain Score: 6  Pain Location: pain in left hip with movement, no pain at rest Pain Descriptors / Indicators: Aching Pain Intervention(s): Premedicated before session;Repositioned  VSS on 6L    Home Living Family/patient expects to be discharged to:: Private residence Living Arrangements: Alone Available Help at Discharge: Family;Available PRN/intermittently Type of Home: Apartment Home Access: Level entry     Home Layout: One level Home Equipment: Walker - 4 wheels;Cane - single point;Shower seat Additional Comments: Pt plans on SNF at [facility] for D/C.     Prior Function Level of Independence: Independent with assistive device(s)               Hand Dominance        Extremity/Trunk Assessment   Upper Extremity Assessment: Generalized weakness           Lower Extremity Assessment: Generalized weakness      Cervical / Trunk Assessment: Normal  Communication      Cognition Arousal/Alertness: Awake/alert Behavior During Therapy: Agitated Overall Cognitive Status: Impaired/Different from baseline Area of Impairment: Orientation;Attention;Memory;Following commands;Safety/judgement;Awareness;Problem solving Orientation Level: Disoriented to;Place;Time;Situation Current Attention Level: Focused Memory: Decreased short-term memory Following Commands: Follows one step  commands inconsistently Safety/Judgement: Decreased awareness of safety;Decreased awareness of deficits   Problem Solving: Slow processing;Decreased initiation;Difficulty sequencing;Requires verbal cues;Requires tactile cues General Comments: pt confabulatory throughout session, not following commands, and repeatedly threatening therapist in room.     General Comments      Exercises         Assessment/Plan    PT Assessment Patient needs continued PT services  PT Diagnosis Difficulty walking;Generalized weakness;Altered mental status   PT Problem List Decreased strength;Decreased cognition;Decreased range of motion;Decreased activity tolerance;Decreased balance;Decreased mobility;Cardiopulmonary status limiting activity;Decreased coordination;Decreased safety awareness;Decreased knowledge of use of DME;Pain  PT Treatment Interventions Gait training;DME instruction;Functional mobility training;Therapeutic activities;Therapeutic exercise;Patient/family education;Cognitive remediation   PT Goals (Current goals can be found in the Care Plan section) Acute Rehab PT Goals Patient Stated Goal: return to normal function and walking PT Goal Formulation: With family Time For Goal Achievement: 01/30/15 Potential to Achieve Goals: Fair    Frequency Min 3X/week   Barriers to discharge Decreased caregiver support      Co-evaluation               End of Session Equipment Utilized During Treatment: Gait belt Activity Tolerance: Other (comment) (limited by AMS) Patient left: in chair;with call bell/phone within reach;with chair alarm set;with family/visitor present;with nursing/sitter in room Nurse Communication: Mobility status;Precautions;Weight bearing status         Time: 1000-1023 PT Time Calculation (min) (ACUTE ONLY): 23 min   Charges:   PT Evaluation $Initial PT Evaluation Tier I: 1 Procedure PT Treatments $Therapeutic Activity: 8-22 mins   PT G CodesMelford Aase 01/16/2015, 10:33 AM [name], [name]

## 2015-01-16 NOTE — Procedures (Signed)
RT attempted to placed pt on BiPAP however pt very combative and agitated.  Pt remains on 5L [place].  RN notified.

## 2015-01-16 NOTE — Progress Notes (Addendum)
[name]  [name] TKZ:[id number] DOB: 01-09-1928 DOA: 01/13/2015 PCP: Redge Gainer, MD  Assessment/Plan: Active Problems:   PAF, recurrance this admission, on chronic Amio/ Coumadin   COPD with emphysema   Chronic respiratory failure with hypoxia   HTN (hypertension)   CAD, remote RCA PCI, subsequently noted to be occluded, last cath 8/11   DM type 2 causing CKD stage 3   Hip fracture   Preop cardiovascular exam   Hip fracture requiring operative repair    Mechanical fall,Left intertrochanteric femur fracture/neck pain Patient became hypoxic requiring 100% nonrebreather in the PACU post surgery Transferred to step down and BiPAP ordered however the patient refused BiPAP and was combative and agitated Patient is oriented and calm this morning, physical therapy recommends SNF Will place social work consultation Patient received FFP and vitamin K prior to surgery Some improvement in headache and neck pain,  CT head and neck negative for intracranial bleed and fracture MRI of the cervical spine does not show any ligamentous disruption, shows facet arthropathy Dilaudid and baclofen for headache and neck pain,    Acute Hypoxemic respiratory failure Combination of GOLD stage IV COPD and pulmonary edema/acute on chronic diastolic CHF  Most recent echo 12/2013 showed LV EF of 55-60% Given couple of doses of IV Lasix last night Patient also has atelectasis, currently requiring 5 L of oxygen Start the patient on incentive spirometry and mobilize as tolerated  History of coronary artery disease, chronic diastolic congestive heart failure without exacerbation, paroxysmal atrial fibrillation, peripheral vascular disease Cardiology consulted for clearance prior to surgery EKG shows first-degree AV block, right bundle branch block Patient will be at increased risk for surgery, however no active chest pain Cardiology recommends to proceed without further  cardiac evaluation Continue amiodarone for atrial fibrillation  ABLA - hg dropped from 11.2 to 7.4 today , POD#1 Will transfuse one unit as patient is hypotensive and has underlying CAD   Supratherapeutic INR, now subtherapeutic Patient received a total of 15 mg of vitamin K since her admission to reverse coagulopathy for surgery and received FFP INR subtherapeutic this morning, restart Coumadin    Peripheral vascular disease, on aspirin at home  Diabetes continue NovoLog and Levemir  Hypertension holding  home medications as pt hypotensive after receiving lasix overnight , had to bolus 750 cc this afternoon to get her BP up, low threshold to start vasopressors as ,pt had pul congestion last night and became hypoxic , cbc , troponin to further evaluate hypotension, HR ok, afebrile   Oxygen dependent COPD, continue Symbicort, when necessary nebulizer treatments      Code Status:      Code Status Orders        Start     Ordered   01/13/15 2020  Full code   Continuous     01/13/15 2019     Family Communication: family updated about patient's clinical progress Disposition Plan:  Continue step down, transferred to telemetry tomorrow   Brief narrative: [name] is a 81 y.o. female with a history of CAD, diastolic CHF, chronic RBBB, Paroxysmal AF (on coumadin and low dose amiodarone), PVD (moderate carotid and LSCA disease), DM2, HTN, HL,COPD on home O2, anxiety, former smoker and DM who was admitted to [facility] on 7/14 for headaches and neck pain. W/U showed EColi UTI and pt received IV Rocephin. Head CT showed old lacunar infarct. She became confused/ delirium in the hospital. She had a fell 7/18  and broke her L hip (intertrochanteric fx). She was transferred to [facility] for ortho eval and surgery. She was ambulatory with a walker and lived independently at home before her recent hospitalization. She denies any chest pain or palpitation.   Heart cath in 2006 showed an  occluded RCA with left-to-right collaterals & otherwise noncritical CAD with EF=25-30% at that time. Her last cath was performed by Dr. Ellouise Newer 01/25/2010 revealed a chronically totally occluded dominant RCA, 50% proximal circumflex with left to right collaterals and normal LV function > medical therapy recommended.  Most recent echo 12/2013 showed LV EF of 55-60%, mild LVH, interobasal akinesis, grade 2 DD, moderate LAE and mild MR. She was doing well on cardiac stand point of view when last seen by Dr. Gwenlyn Found 11/2013.  Consultants:  Cardiology  Orthopedics    Procedures:  None  Antibiotics: Anti-infectives    Start     Dose/Rate Route Frequency Ordered Stop   01/15/15 2100  ceFAZolin (ANCEF) IVPB 2 g/50 mL premix     2 g 100 mL/hr over 30 Minutes Intravenous Every 6 hours 01/15/15 2027 01/16/15 0310   01/15/15 1430  ceFAZolin (ANCEF) IVPB 2 g/50 mL premix     2 g 100 mL/hr over 30 Minutes Intravenous To [facility] 01/14/15 1259 01/15/15 1450         HPI/Subjective: Calm and composed this morning, however she was agitated earlier this morning and pick at lines and surgical dressing, denies shortness of breath but complains of pain  Objective: Filed Vitals:   01/16/15 0000 01/16/15 0400 01/16/15 0743 01/16/15 1028  BP: 122/61 135/65 150/70   Pulse: 89 70 89   Temp: 98.5 F (36.9 C) 98.2 F (36.8 C) 98.2 F (36.8 C)   TempSrc: Axillary Axillary Oral   Resp: 17 15 17    Height:      Weight:      SpO2: 87% 100% 100% 96%    Intake/Output Summary (Last 24 hours) at 01/16/15 1104 Last data filed at 01/16/15 0086  Gross per 24 hour  Intake 1620.34 ml  Output   3225 ml  Net -1604.66 ml    Exam:  General: Uncomfortable Lungs: Clear to auscultation bilaterally without wheezes or crackles Cardiovascular: Regular rate and rhythm without murmur gallop or rub normal S1 and S2 Abdomen: Nontender, nondistended, soft, bowel sounds positive, no rebound, no ascites,  no appreciable mass Extremities: No significant cyanosis, clubbing, or edema bilateral lower extremities     Data Review   Micro Results Recent Results (from the past 240 hour(s))  Surgical pcr screen     Status: None   Collection Time: 01/15/15  6:10 AM  Result Value Ref Range Status   MRSA, PCR NEGATIVE NEGATIVE Final   Staphylococcus aureus NEGATIVE NEGATIVE Final    Comment:        The Xpert SA Assay (FDA approved for NASAL specimens in patients over 21 years of age), is one component of a comprehensive surveillance program.  Test performance has been validated by [facility] for patients greater than or equal to 40 year old. It is not intended to diagnose infection nor to guide or monitor treatment.   MRSA PCR Screening     Status: None   Collection Time: 01/15/15  8:56 PM  Result Value Ref Range Status   MRSA by PCR NEGATIVE NEGATIVE Final    Comment:        The GeneXpert MRSA Assay (FDA approved for NASAL specimens only), is one  component of a comprehensive MRSA colonization surveillance program. It is not intended to diagnose MRSA infection nor to guide or monitor treatment for MRSA infections.     Radiology Reports Ct Head Wo Contrast  01/14/2015   ADDENDUM REPORT: 01/14/2015 12:08  ADDENDUM: Study discussed by telephone with Dr. Reyne Dumas on 01/14/2015 at 1204 hours.   Electronically Signed   By: Genevie Ann M.D.   On: 01/14/2015 12:08   01/14/2015   CLINICAL DATA:  81 year old female who fell and was unconscious. Headache and cervical neck pain. Initial encounter.  EXAM: CT HEAD WITHOUT CONTRAST  CT CERVICAL SPINE WITHOUT CONTRAST  TECHNIQUE: Multidetector CT imaging of the head and cervical spine was performed following the standard protocol without intravenous contrast. Multiplanar CT image reconstructions of the cervical spine were also generated.  COMPARISON:  [facility] CT without contrast 718 2016, 01/08/2015. Cervical spine  radiographs 09/05/2013. Cervical spine CT 07/10/2011.  FINDINGS: CT HEAD FINDINGS  Paranasal sinuses and mastoids appear stable and clear. Periapical left maxillary molar dental lucency appears inflammatory in nature. No calvarium fracture identified. No scalp hematoma. Orbits soft tissues appear stable.  Chronic Calcified atherosclerosis at the skull base. Cerebral volume is stable and normal for age. No ventriculomegaly. No midline shift, mass effect, or evidence of intracranial mass lesion. No acute intracranial hemorrhage identified. No evidence of cortically based acute infarction identified. Normal for age gray-white matter differentiation.  CT CERVICAL SPINE FINDINGS  Multilevel chronic cervical spondylolisthesis. Pronounced chronic anterolisthesis at C3-C4 appears stable since 2013. Mild anterolisthesis at C2-C3 and C7-T1 also appear stable. Associated chronic cervical facet arthropathy. Bilateral cervical posterior element alignment appears stable. Superimposed severe chronic lower cervical disc and endplate with chronic endplate osteophytosis. Degeneration occipital condyles to C1 alignment appears stable. Chronic C1-C2 degeneration may be mildly progressed since 2013. Stable C1-C2 alignment degenerative odontoid tip subchondral cysts do appear increased. No acute cervical spine fracture identified.  However, there is a small volume of abnormal prevertebral soft tissue fluid which is new. This is primarily about the C4 level (series 5, image 48).  Chronic calcified carotid atherosclerosis in the neck. Stable other noncontrast paraspinal soft tissues. Grossly intact visualized upper thoracic levels.  IMPRESSION: 1. New abnormal prevertebral fluid at the C4 level, suspicious for acute cervical anterior ligamentous injury in this setting. Cervical spine MRI without contrast would evaluate further. 2. No acute fracture or listhesis identified in the cervical spine. Chronic advanced cervical spondylolisthesis  and degenerative changes. 3. Stable and negative for age noncontrast CT appearance of the brain.  Electronically Signed: By: Genevie Ann M.D. On: 01/14/2015 11:54   Ct Cervical Spine Wo Contrast  01/14/2015   ADDENDUM REPORT: 01/14/2015 12:08  ADDENDUM: Study discussed by telephone with Dr. Reyne Dumas on 01/14/2015 at 1204 hours.   Electronically Signed   By: Genevie Ann M.D.   On: 01/14/2015 12:08   01/14/2015   CLINICAL DATA:  81 year old female who fell and was unconscious. Headache and cervical neck pain. Initial encounter.  EXAM: CT HEAD WITHOUT CONTRAST  CT CERVICAL SPINE WITHOUT CONTRAST  TECHNIQUE: Multidetector CT imaging of the head and cervical spine was performed following the standard protocol without intravenous contrast. Multiplanar CT image reconstructions of the cervical spine were also generated.  COMPARISON:  [facility] CT without contrast 718 2016, 01/08/2015. Cervical spine radiographs 09/05/2013. Cervical spine CT 07/10/2011.  FINDINGS: CT HEAD FINDINGS  Paranasal sinuses and mastoids appear stable and clear. Periapical left maxillary molar dental  lucency appears inflammatory in nature. No calvarium fracture identified. No scalp hematoma. Orbits soft tissues appear stable.  Chronic Calcified atherosclerosis at the skull base. Cerebral volume is stable and normal for age. No ventriculomegaly. No midline shift, mass effect, or evidence of intracranial mass lesion. No acute intracranial hemorrhage identified. No evidence of cortically based acute infarction identified. Normal for age gray-white matter differentiation.  CT CERVICAL SPINE FINDINGS  Multilevel chronic cervical spondylolisthesis. Pronounced chronic anterolisthesis at C3-C4 appears stable since 2013. Mild anterolisthesis at C2-C3 and C7-T1 also appear stable. Associated chronic cervical facet arthropathy. Bilateral cervical posterior element alignment appears stable. Superimposed severe chronic lower cervical disc and  endplate with chronic endplate osteophytosis. Degeneration occipital condyles to C1 alignment appears stable. Chronic C1-C2 degeneration may be mildly progressed since 2013. Stable C1-C2 alignment degenerative odontoid tip subchondral cysts do appear increased. No acute cervical spine fracture identified.  However, there is a small volume of abnormal prevertebral soft tissue fluid which is new. This is primarily about the C4 level (series 5, image 48).  Chronic calcified carotid atherosclerosis in the neck. Stable other noncontrast paraspinal soft tissues. Grossly intact visualized upper thoracic levels.  IMPRESSION: 1. New abnormal prevertebral fluid at the C4 level, suspicious for acute cervical anterior ligamentous injury in this setting. Cervical spine MRI without contrast would evaluate further. 2. No acute fracture or listhesis identified in the cervical spine. Chronic advanced cervical spondylolisthesis and degenerative changes. 3. Stable and negative for age noncontrast CT appearance of the brain.  Electronically Signed: By: Genevie Ann M.D. On: 01/14/2015 11:54   Mr Cervical Spine Wo Contrast  01/14/2015   CLINICAL DATA:  Headaches and neck pain.  Fall 01/12/2015.  EXAM: MRI CERVICAL SPINE WITHOUT CONTRAST  TECHNIQUE: Multiplanar, multisequence MR imaging of the cervical spine was performed. No intravenous contrast was administered.  COMPARISON:  Cervical spine CT 01/14/2015 and MRI 07/22/2005  FINDINGS: Study is mildly to moderately motion degraded despite repeating some sequences.  There is mild prevertebral STIR hyperintensity at C3-4 compatible with edema as seen on CT earlier today. No frank disruption of the anterior or posterior longitudinal ligaments is identified. No posterior soft tissue edema suggestive of posterior ligamentous complex injury is identified. No vertebral marrow edema is seen to suggest acute osseous injury.  Vertebral alignment is unchanged from the 2007 MRI with grade 1  anterolisthesis of C2 on C3, C3 on C4, and C7 on T1. Vertebral body heights are preserved. Moderate to severe disc space narrowing is present from C4-5 to C6-7. Craniocervical junction is unremarkable. Cervical spinal cord is normal in signal. 11 mm T2 hyperintense right thyroid nodule is noted.  C2-3: Advanced right and mild left facet arthrosis, mild disc uncovering, and infolding of the ligamentum flavum result in moderate spinal stenosis, increased from the prior MRI.  C3-4: Anterolisthesis with disc uncovering and advanced bilateral facet arthrosis result in mild spinal stenosis, slightly more prominent than on the prior MRI. There is at least moderate bilateral neural foraminal stenosis, increased from prior MRI.  C4-5: Broad-based posterior disc osteophyte complex and uncovertebral spurring result in mild spinal stenosis asymmetric to the right and moderate to severe right and moderate left neural foraminal stenosis, grossly similar to the prior MRI.  C5-6: Broad-based posterior disc osteophyte complex asymmetric to the right and uncovertebral spurring results in mild spinal stenosis in severe bilateral neural foraminal stenosis, similar to the prior MRI.  C6-7: Broad-based posterior disc osteophyte complex and uncovertebral spurring result in mild spinal stenosis and  moderate bilateral neural foraminal stenosis. The neural foraminal stenosis may have mildly progressed from the prior MRI.  C7-T1: Slight anterolisthesis and disc uncovering without significant stenosis, unchanged.  IMPRESSION: 1. Motion degraded examination. Mild prevertebral edema in the upper cervical spine without frank ligamentous disruption identified. 2. Multilevel cervical disc and facet degeneration as above, mildly progressed from the prior MRI.   Electronically Signed   By: Logan Bores   On: 01/14/2015 20:48   Pelvis Portable  01/15/2015   CLINICAL DATA:  81 year old female with post of for left hip fracture.  EXAM: PORTABLE  PELVIS 1-2 VIEWS  COMPARISON:  Radiograph dated 12/1914  FINDINGS: There has been interval placement of an intra medullary rod and screw traversing femoral intertrochanteric fracture. The bones are osteopenic. There are osteoarthritic changes of the hip joints. No acute fracture identified.  IMPRESSION: Left femoral intertrochanteric fracture status post placement of orthopedic hardware.   Electronically Signed   By: Anner Crete M.D.   On: 01/15/2015 18:01   Dg Chest Port 1 View  01/15/2015   CLINICAL DATA:  Decreased oxygen saturation following surgery.  EXAM: PORTABLE CHEST - 1 VIEW  COMPARISON:  01/13/2015; 01/12/2015; 12/27/2013  FINDINGS: Grossly unchanged enlarged cardiac silhouette and mediastinal contours with atherosclerotic plaque within the thoracic aorta. Lung volumes are reduced with worsening bibasilar heterogeneous/consolidative opacities, left greater than right. Pulmonary vasculature appears less distinct than present examination. Trace bilateral effusions are not excluded. No pneumothorax. Unchanged bones.  IMPRESSION: Suspected mild worsening pulmonary edema and bibasilar atelectasis on this hypoventilated AP portable examination. Further evaluation with a PA and lateral chest radiograph may be obtained as clinically indicated.   Electronically Signed   By: Sandi Mariscal M.D.   On: 01/15/2015 18:32   Chest Portable 1 View  01/13/2015   CLINICAL DATA:  Preop for left femur fracture  EXAM: PORTABLE CHEST - 1 VIEW  COMPARISON:  01/12/2015  FINDINGS: Moderate to severe cardiac enlargement. Calcified aortic arch. Mild vascular congestion. No evidence of consolidation or edema. Minimal right lower lobe scarring or atelectasis.  IMPRESSION: No acute findings. Stable cardiac enlargement with vascular congestion but no pulmonary edema.   Electronically Signed   By: Skipper Cliche M.D.   On: 01/13/2015 21:50   Dg Hip Operative Unilat With Pelvis Left  01/15/2015   CLINICAL DATA:  Patient with  recent diagnosis mildly displaced left femoral intertrochanteric fracture  EXAM: OPERATIVE LEFT HIP (WITH PELVIS IF PERFORMED) 4 VIEWS  TECHNIQUE: Fluoroscopic spot image(s) were submitted for interpretation post-operatively.  COMPARISON:  Hip radiograph 01/13/2015  FINDINGS: Patient status post intra medullary rod and screw fixation of comminuted proximal left femur fracture involving the intertrochanteric region. Hardware appears in appropriate position. Improved anatomic alignment.  IMPRESSION: Patient status post ORIF proximal left femur fracture.   Electronically Signed   By: Lovey Newcomer M.D.   On: 01/15/2015 15:40     CBC  Recent Labs Lab 01/13/15 2119 01/15/15 0400 01/16/15 0302  WBC 8.3 7.1 9.0  HGB 11.2* 9.6* 8.8*  HCT 36.4 31.3* 28.2*  PLT 222 227 218  MCV 99.5 98.1 97.6  MCH 30.6 30.1 30.4  MCHC 30.8 30.7 31.2  RDW 13.7 13.6 13.6    Chemistries   Recent Labs Lab 01/13/15 2119 01/15/15 0400 01/16/15 0302  NA 143 143 143  K 3.9 3.7 3.4*  CL 104 100* 98*  CO2 27 33* 34*  GLUCOSE 153* 99 101*  BUN 27* 21* 16  CREATININE 1.32* 1.10* 1.06*  CALCIUM 8.8* 9.2 8.8*  AST 24 17 22   ALT 18 16 16   ALKPHOS 71 64 63  BILITOT 0.9 0.8 0.7   ------------------------------------------------------------------------------------------------------------------ estimated creatinine clearance is 34.6 mL/min (by C-G formula based on Cr of 1.06). ------------------------------------------------------------------------------------------------------------------ No results for input(s): HGBA1C in the last 72 hours. ------------------------------------------------------------------------------------------------------------------ No results for input(s): CHOL, HDL, LDLCALC, TRIG, CHOLHDL, LDLDIRECT in the last 72 hours. ------------------------------------------------------------------------------------------------------------------ No results for input(s): TSH, T4TOTAL, T3FREE, THYROIDAB in  the last 72 hours.  Invalid input(s): FREET3 ------------------------------------------------------------------------------------------------------------------ No results for input(s): VITAMINB12, FOLATE, FERRITIN, TIBC, IRON, RETICCTPCT in the last 72 hours.  Coagulation profile  Recent Labs Lab 01/14/15 0448 01/14/15 1623 01/15/15 0400 01/15/15 1228 01/16/15 0302  INR 4.90* 5.65* 3.34* 1.84* 1.24    No results for input(s): DDIMER in the last 72 hours.  Cardiac Enzymes No results for input(s): CKMB, TROPONINI, MYOGLOBIN in the last 168 hours.  Invalid input(s): CK ------------------------------------------------------------------------------------------------------------------ Invalid input(s): POCBNP   CBG:  Recent Labs Lab 01/15/15 1146 01/15/15 1351 01/15/15 1553 01/15/15 2150 01/16/15 0742  GLUCAP 145* 112* 112* 110* 126*       Studies: Ct Head Wo Contrast  01/14/2015   ADDENDUM REPORT: 01/14/2015 12:08  ADDENDUM: Study discussed by telephone with Dr. Reyne Dumas on 01/14/2015 at 1204 hours.   Electronically Signed   By: Genevie Ann M.D.   On: 01/14/2015 12:08   01/14/2015   CLINICAL DATA:  81 year old female who fell and was unconscious. Headache and cervical neck pain. Initial encounter.  EXAM: CT HEAD WITHOUT CONTRAST  CT CERVICAL SPINE WITHOUT CONTRAST  TECHNIQUE: Multidetector CT imaging of the head and cervical spine was performed following the standard protocol without intravenous contrast. Multiplanar CT image reconstructions of the cervical spine were also generated.  COMPARISON:  [facility] CT without contrast 718 2016, 01/08/2015. Cervical spine radiographs 09/05/2013. Cervical spine CT 07/10/2011.  FINDINGS: CT HEAD FINDINGS  Paranasal sinuses and mastoids appear stable and clear. Periapical left maxillary molar dental lucency appears inflammatory in nature. No calvarium fracture identified. No scalp hematoma. Orbits soft tissues appear  stable.  Chronic Calcified atherosclerosis at the skull base. Cerebral volume is stable and normal for age. No ventriculomegaly. No midline shift, mass effect, or evidence of intracranial mass lesion. No acute intracranial hemorrhage identified. No evidence of cortically based acute infarction identified. Normal for age gray-white matter differentiation.  CT CERVICAL SPINE FINDINGS  Multilevel chronic cervical spondylolisthesis. Pronounced chronic anterolisthesis at C3-C4 appears stable since 2013. Mild anterolisthesis at C2-C3 and C7-T1 also appear stable. Associated chronic cervical facet arthropathy. Bilateral cervical posterior element alignment appears stable. Superimposed severe chronic lower cervical disc and endplate with chronic endplate osteophytosis. Degeneration occipital condyles to C1 alignment appears stable. Chronic C1-C2 degeneration may be mildly progressed since 2013. Stable C1-C2 alignment degenerative odontoid tip subchondral cysts do appear increased. No acute cervical spine fracture identified.  However, there is a small volume of abnormal prevertebral soft tissue fluid which is new. This is primarily about the C4 level (series 5, image 48).  Chronic calcified carotid atherosclerosis in the neck. Stable other noncontrast paraspinal soft tissues. Grossly intact visualized upper thoracic levels.  IMPRESSION: 1. New abnormal prevertebral fluid at the C4 level, suspicious for acute cervical anterior ligamentous injury in this setting. Cervical spine MRI without contrast would evaluate further. 2. No acute fracture or listhesis identified in the cervical spine. Chronic advanced cervical spondylolisthesis and degenerative changes. 3. Stable and negative for age noncontrast CT appearance of the brain.  Electronically Signed: By: Genevie Ann M.D. On: 01/14/2015 11:54   Ct Cervical Spine Wo Contrast  01/14/2015   ADDENDUM REPORT: 01/14/2015 12:08  ADDENDUM: Study discussed by telephone with Dr. Reyne Dumas on 01/14/2015 at 1204 hours.   Electronically Signed   By: Genevie Ann M.D.   On: 01/14/2015 12:08   01/14/2015   CLINICAL DATA:  81 year old female who fell and was unconscious. Headache and cervical neck pain. Initial encounter.  EXAM: CT HEAD WITHOUT CONTRAST  CT CERVICAL SPINE WITHOUT CONTRAST  TECHNIQUE: Multidetector CT imaging of the head and cervical spine was performed following the standard protocol without intravenous contrast. Multiplanar CT image reconstructions of the cervical spine were also generated.  COMPARISON:  [facility] CT without contrast 718 2016, 01/08/2015. Cervical spine radiographs 09/05/2013. Cervical spine CT 07/10/2011.  FINDINGS: CT HEAD FINDINGS  Paranasal sinuses and mastoids appear stable and clear. Periapical left maxillary molar dental lucency appears inflammatory in nature. No calvarium fracture identified. No scalp hematoma. Orbits soft tissues appear stable.  Chronic Calcified atherosclerosis at the skull base. Cerebral volume is stable and normal for age. No ventriculomegaly. No midline shift, mass effect, or evidence of intracranial mass lesion. No acute intracranial hemorrhage identified. No evidence of cortically based acute infarction identified. Normal for age gray-white matter differentiation.  CT CERVICAL SPINE FINDINGS  Multilevel chronic cervical spondylolisthesis. Pronounced chronic anterolisthesis at C3-C4 appears stable since 2013. Mild anterolisthesis at C2-C3 and C7-T1 also appear stable. Associated chronic cervical facet arthropathy. Bilateral cervical posterior element alignment appears stable. Superimposed severe chronic lower cervical disc and endplate with chronic endplate osteophytosis. Degeneration occipital condyles to C1 alignment appears stable. Chronic C1-C2 degeneration may be mildly progressed since 2013. Stable C1-C2 alignment degenerative odontoid tip subchondral cysts do appear increased. No acute cervical spine fracture  identified.  However, there is a small volume of abnormal prevertebral soft tissue fluid which is new. This is primarily about the C4 level (series 5, image 48).  Chronic calcified carotid atherosclerosis in the neck. Stable other noncontrast paraspinal soft tissues. Grossly intact visualized upper thoracic levels.  IMPRESSION: 1. New abnormal prevertebral fluid at the C4 level, suspicious for acute cervical anterior ligamentous injury in this setting. Cervical spine MRI without contrast would evaluate further. 2. No acute fracture or listhesis identified in the cervical spine. Chronic advanced cervical spondylolisthesis and degenerative changes. 3. Stable and negative for age noncontrast CT appearance of the brain.  Electronically Signed: By: Genevie Ann M.D. On: 01/14/2015 11:54   Mr Cervical Spine Wo Contrast  01/14/2015   CLINICAL DATA:  Headaches and neck pain.  Fall 01/12/2015.  EXAM: MRI CERVICAL SPINE WITHOUT CONTRAST  TECHNIQUE: Multiplanar, multisequence MR imaging of the cervical spine was performed. No intravenous contrast was administered.  COMPARISON:  Cervical spine CT 01/14/2015 and MRI 07/22/2005  FINDINGS: Study is mildly to moderately motion degraded despite repeating some sequences.  There is mild prevertebral STIR hyperintensity at C3-4 compatible with edema as seen on CT earlier today. No frank disruption of the anterior or posterior longitudinal ligaments is identified. No posterior soft tissue edema suggestive of posterior ligamentous complex injury is identified. No vertebral marrow edema is seen to suggest acute osseous injury.  Vertebral alignment is unchanged from the 2007 MRI with grade 1 anterolisthesis of C2 on C3, C3 on C4, and C7 on T1. Vertebral body heights are preserved. Moderate to severe disc space narrowing is present from C4-5 to C6-7. Craniocervical junction is unremarkable. Cervical spinal  cord is normal in signal. 11 mm T2 hyperintense right thyroid nodule is noted.  C2-3:  Advanced right and mild left facet arthrosis, mild disc uncovering, and infolding of the ligamentum flavum result in moderate spinal stenosis, increased from the prior MRI.  C3-4: Anterolisthesis with disc uncovering and advanced bilateral facet arthrosis result in mild spinal stenosis, slightly more prominent than on the prior MRI. There is at least moderate bilateral neural foraminal stenosis, increased from prior MRI.  C4-5: Broad-based posterior disc osteophyte complex and uncovertebral spurring result in mild spinal stenosis asymmetric to the right and moderate to severe right and moderate left neural foraminal stenosis, grossly similar to the prior MRI.  C5-6: Broad-based posterior disc osteophyte complex asymmetric to the right and uncovertebral spurring results in mild spinal stenosis in severe bilateral neural foraminal stenosis, similar to the prior MRI.  C6-7: Broad-based posterior disc osteophyte complex and uncovertebral spurring result in mild spinal stenosis and moderate bilateral neural foraminal stenosis. The neural foraminal stenosis may have mildly progressed from the prior MRI.  C7-T1: Slight anterolisthesis and disc uncovering without significant stenosis, unchanged.  IMPRESSION: 1. Motion degraded examination. Mild prevertebral edema in the upper cervical spine without frank ligamentous disruption identified. 2. Multilevel cervical disc and facet degeneration as above, mildly progressed from the prior MRI.   Electronically Signed   By: Logan Bores   On: 01/14/2015 20:48   Pelvis Portable  01/15/2015   CLINICAL DATA:  81 year old female with post of for left hip fracture.  EXAM: PORTABLE PELVIS 1-2 VIEWS  COMPARISON:  Radiograph dated 12/1914  FINDINGS: There has been interval placement of an intra medullary rod and screw traversing femoral intertrochanteric fracture. The bones are osteopenic. There are osteoarthritic changes of the hip joints. No acute fracture identified.  IMPRESSION: Left  femoral intertrochanteric fracture status post placement of orthopedic hardware.   Electronically Signed   By: Anner Crete M.D.   On: 01/15/2015 18:01   Dg Chest Port 1 View  01/15/2015   CLINICAL DATA:  Decreased oxygen saturation following surgery.  EXAM: PORTABLE CHEST - 1 VIEW  COMPARISON:  01/13/2015; 01/12/2015; 12/27/2013  FINDINGS: Grossly unchanged enlarged cardiac silhouette and mediastinal contours with atherosclerotic plaque within the thoracic aorta. Lung volumes are reduced with worsening bibasilar heterogeneous/consolidative opacities, left greater than right. Pulmonary vasculature appears less distinct than present examination. Trace bilateral effusions are not excluded. No pneumothorax. Unchanged bones.  IMPRESSION: Suspected mild worsening pulmonary edema and bibasilar atelectasis on this hypoventilated AP portable examination. Further evaluation with a PA and lateral chest radiograph may be obtained as clinically indicated.   Electronically Signed   By: Sandi Mariscal M.D.   On: 01/15/2015 18:32   Dg Hip Operative Unilat With Pelvis Left  01/15/2015   CLINICAL DATA:  Patient with recent diagnosis mildly displaced left femoral intertrochanteric fracture  EXAM: OPERATIVE LEFT HIP (WITH PELVIS IF PERFORMED) 4 VIEWS  TECHNIQUE: Fluoroscopic spot image(s) were submitted for interpretation post-operatively.  COMPARISON:  Hip radiograph 01/13/2015  FINDINGS: Patient status post intra medullary rod and screw fixation of comminuted proximal left femur fracture involving the intertrochanteric region. Hardware appears in appropriate position. Improved anatomic alignment.  IMPRESSION: Patient status post ORIF proximal left femur fracture.   Electronically Signed   By: Lovey Newcomer M.D.   On: 01/15/2015 15:40      Lab Results  Component Value Date   HGBA1C 7.3 11/04/2014   HGBA1C 7..2% 06/12/2014   HGBA1C 7.0* 12/28/2013   Lab Results  Component Value Date   LDLCALC 48 11/04/2014    CREATININE 1.06* 01/16/2015       Scheduled Meds: . amiodarone  100 mg Oral Daily  . aspirin  81 mg Oral Daily  . atorvastatin  40 mg Oral q1800  . budesonide-formoterol  1 puff Inhalation BID  . cholecalciferol  1,000 Units Oral Daily  . feeding supplement (GLUCERNA SHAKE)  237 mL Oral BID BM  . ferrous sulfate  325 mg Oral TID PC  . fluticasone  1 spray Each Nare BID  . insulin aspart  0-5 Units Subcutaneous QHS  . insulin aspart  0-9 Units Subcutaneous TID WC  . insulin detemir  8 Units Subcutaneous q morning - 10a  . isosorbide mononitrate  60 mg Oral Daily  . LORazepam  0.5 mg Intravenous Once  . metoprolol succinate  12.5 mg Oral Daily  . NIFEdipine  30 mg Oral Daily  . pantoprazole  40 mg Oral BID  . potassium chloride  10 mEq Intravenous Q1 Hr x 4  . potassium chloride SA  20 mEq Oral Daily  . senna-docusate  1 tablet Oral BID  . vitamin C  500 mg Oral QHS  . Warfarin - Pharmacist Dosing Inpatient   Does not apply q1800   Continuous Infusions: . sodium chloride 10 mL/hr at 01/13/15 2029  . lactated ringers 10 mL/hr at 01/15/15 1409    Active Problems:   PAF, recurrance this admission, on chronic Amio/ Coumadin   COPD with emphysema   Chronic respiratory failure with hypoxia   HTN (hypertension)   CAD, remote RCA PCI, subsequently noted to be occluded, last cath 8/11   DM type 2 causing CKD stage 3   Hip fracture   Preop cardiovascular exam   Hip fracture requiring operative repair    Time spent: 65 minutes   [facility] Pager [phone]. If 7PM-7AM, please contact night-coverage at [email or web address], password [facility] 01/16/2015, 11:04 AM  LOS: 3 days

## 2015-01-16 NOTE — Progress Notes (Signed)
BP 66/37; MD notified and 500cc bolus given and STAT CBC ordered Dorena Cookey, RN

## 2015-01-16 NOTE — Progress Notes (Signed)
Pt agitated, picking at lines and surgical dressings. Pt removed distal surgical dressing, replaced with non-adhesive pad and tape. Medicated for pain. Will continue to monitor.

## 2015-01-16 NOTE — Progress Notes (Signed)
Orthopedic Tech Progress Note Patient Details:  [name] [birth year] [id number]  Patient ID: [name], female   DOB: 09/10/1927, 81 y.o.   MRN: [id number]   Irish Elders 01/16/2015, 11:56 AMunable to use device.

## 2015-01-16 NOTE — Progress Notes (Signed)
ANTICOAGULATION CONSULT NOTE - Follow-Up Consult  Pharmacy Consult for Warfarin Indication: atrial fibrillation  Allergies  Allergen Reactions  . Atorvastatin Other (See Comments)     muscle aches  . Codeine Nausea And Vomiting  . Morphine Nausea And Vomiting  . Other Other (See Comments)    OPIATES cause nausea and vomiting  . Rosuvastatin Other (See Comments)    muscle aches at high doses, held as of 12/2010 due to aches  . Iohexol Other (See Comments)     Desc: unknown reaction; allergic to iodine and contrast     Patient Measurements: Height: 5\' 1"  (154.9 cm) Weight: 147 lb 4.3 oz (66.8 kg) IBW/kg (Calculated) : 47.8   Vital Signs: Temp: 98.2 F (36.8 C) (07/22 0743) Temp Source: Oral (07/22 0743) BP: 150/70 mmHg (07/22 0743) Pulse Rate: 89 (07/22 0743)  Labs:  Recent Labs  01/13/15 2119  01/15/15 0400 01/15/15 1228 01/16/15 0302  HGB 11.2*  --  9.6*  --  8.8*  HCT 36.4  --  31.3*  --  28.2*  PLT 222  --  227  --  218  LABPROT 39.7*  < > 33.2* 21.2* 15.8*  INR 4.23*  < > 3.34* 1.84* 1.24  CREATININE 1.32*  --  1.10*  --  1.06*  < > = values in this interval not displayed.  Estimated Creatinine Clearance: 34.6 mL/min (by C-G formula based on Cr of 1.06).   Medical History: Past Medical History  Diagnosis Date  . Hyperlipidemia   . Hypertension   . Anxiety   . GERD (gastroesophageal reflux disease)   . CAD (coronary artery disease)     Cath in 2006 showed an occluded RCA with left-to-right collaterals & otherwise noncritical CAD with EF=25-30% at that time. EF subsequently improved to normal by 2D ECHO. Cath Aug. 2011 showed unchanged anatomy. > medical therapy recommended  . Diastolic dysfunction     Grade II  . RBBB (right bundle branch block)     Chronic  . Hypoxemic respiratory failure, chronic     uses 2.5 liters of oxygen with sleep  . COPD (chronic obstructive pulmonary disease)     GOLD 4 - PFT 06/08/10 FEV1 0.93 (62%), FEV1% 60, TLC 2.84  (69%0, DLCO 54%, +BD) On home O2.  . Secondary pulmonary hypertension   . Edema   . Angina   . Shortness of breath   . Bradycardia 08/24/2011  . Carotid artery disease     Moderate, right greater than left which we following by duplex ultrasound. [place] 12/05/11 = RIght Bulb/Prox ICA: Moderate to severe amt of fibrous plaque elevating velocities w/in prox segment of ICA. Consistent w/a 50-69% diameter reduction. Left Bulb/Prox ICA: Moderate amt of fibrous plaque slightly elevating velocities w/in the prox segment of the ICA. Consistent w/a 0-49% diameter reduction.   Marland Kitchen PAF (paroxysmal atrial fibrillation)     In the past, on coumadin  . History of stress test 07/02/09    Mild perfusion defect seen in the Basal Inferior region(s). This is consistent with an infarct/scar.  Post-stress EF=56%. Global LV systolic function is normal.  EKG is negative for ischemia.  No significant iscemia detected. Low risk scan.  . Lower extremity edema     ECHO 07/21/12 = EF 55-60%, performed for TIA. Responding to diurectics. Continue diuresis w/ a goal dry weight of <160lb. Venous Duplex 07/27/11 = Right lower extremity: no evidence of thrombus or thrombophlebitis. Essentially normal right lower extremity venous duplex Doppler evaluation.  Marland Kitchen  Carotid bruit   . Mitral valve regurgitation     Mild to moderate by ECHO 07/21/12  . Sleep-related hypoventilation   . COPD with emphysema   . Coronary atherosclerosis of unspecified type of vessel, native or graft   . PONV (postoperative nausea and vomiting)   . Family history of adverse reaction to anesthesia     "daughter gets PONV"  . CHF (congestive heart failure)     Hospitalized 08/22/11-08/26/11 with CHF secondary to diastolic dysfunction & hospitilized in 07/2012 with a similar problem. TTE 08/23/11 = normal EF.  Marland Kitchen Myocardial infarction     "she's had several" (01/13/2015)  . On home oxygen therapy     "?L; prn" (01/13/2015)  . Pneumonia ~ 2013  . Type II diabetes mellitus      goal A1C is 8, to avoid hypoglycemia  . Sleep apnea   . Stroke     "/CT scan; ~ 2014; didn't even know she'd had it"  (01/13/2015)  . Arthritis     "all over"  . Chronic lower back pain   . Bleeds easily   . Fall during current hospitalization     "was at [facility]; fell; transferred to [facility]" (01/13/2015)    Medications:  Prescriptions prior to admission  Medication Sig Dispense Refill Last Dose  . warfarin (COUMADIN) 5 MG tablet TAKE (1) TABLET DAILY AFTER SUPPER. (Patient taking differently: TAKE (1) TABLET DAILY AFTER SUPPER EXCEPT ON SATURDAY. NO MEDICATION ON SATURDAY) 30 tablet 2 unknown  . ALPRAZolam (XANAX) 0.25 MG tablet TAKE 1/2 TO 1 TABLET 2 TIMES A DAY AS NEEDED FOR ANXIETY 30 tablet 2 Taking  . amiodarone (PACERONE) 200 MG tablet Take 0.5 tablets (100 mg total) by mouth daily. Please keep upcoming appointment 30 tablet 0 Taking  . aspirin 81 MG chewable tablet Chew 81 mg by mouth daily.   Taking  . atorvastatin (LIPITOR) 40 MG tablet Take 1 tablet (40 mg total) by mouth every evening. 30 tablet 8 Taking  . Azelastine HCl 0.15 % SOLN Place 1 spray into both nostrils 2 (two) times daily as needed (congestion).   Taking  . budesonide-formoterol (SYMBICORT) 160-4.5 MCG/ACT inhaler Inhale 1 puff into the lungs 2 (two) times daily. 3 Inhaler 2 Taking  . Cholecalciferol (VITAMIN D PO) Take 1 capsule by mouth daily.    Taking  . fluticasone (FLONASE) 50 MCG/ACT nasal spray Place 1 spray into both nostrils 2 (two) times daily. 16 g 6 Taking  . furosemide (LASIX) 40 MG tablet Take 1 tablet (40 mg total) by mouth daily. May take extra dose if weight increases 3.5 pounds in 24 hrs as needed for fluid and edema. 60 tablet 4 Taking  . glucosamine-chondroitin 500-400 MG tablet Take 1 tablet by mouth 2 (two) times daily.   Taking  . glucose blood (RELION ULTIMA TEST) test strip USE TO CHECK GLUCOSE UP TO 4 TIMES DAILY 200 each 1 Taking  . insulin aspart (NOVOLOG) 100 UNIT/ML injection  Inject 0 to 8 units up to 3 times a day prior to meal per sliding scale 20 mL 2 Taking  . insulin detemir (LEVEMIR) 100 UNIT/ML injection Inject 0.16 mLs (16 Units total) into the skin every morning. 20 mL 2 Taking  . insulin lispro (HUMALOG KWIKPEN) 100 UNIT/ML KiwkPen Inject 0-8 Units into the skin 3 (three) times daily before meals. Per sliding scale   Not Taking  . Ipratropium-Albuterol (COMBIVENT) 20-100 MCG/ACT AERS respimat Inhale 1 puff into the lungs every 6 (six)  hours as needed for wheezing or shortness of breath. 3 Inhaler 2 Taking  . isosorbide mononitrate (IMDUR) 60 MG 24 hr tablet TAKE 1 TABLET DAILY 30 tablet 4 Taking  . metoprolol succinate (TOPROL-XL) 25 MG 24 hr tablet TAKE 1/2 TABLET DAILY 15 tablet 4 Taking  . Multiple Vitamins-Minerals (PRESERVISION/LUTEIN) CAPS Take 1 capsule by mouth daily.    Taking  . NIFEdipine (NIFEDICAL XL) 30 MG 24 hr tablet Take 1 tablet (30 mg total) by mouth daily. PATIENT NEEDS TO CONTACT OFFICE FOR ADDITIONAL REFILLS 30 tablet 0 Taking  . nitroGLYCERIN (NITROSTAT) 0.4 MG SL tablet Place 1 tablet (0.4 mg total) under the tongue every 5 (five) minutes as needed for chest pain. 25 tablet 1 Taking  . pantoprazole (PROTONIX) 40 MG tablet TAKE 1 TABLET TWICE A DAY 60 tablet 4 Taking  . potassium chloride SA (K-DUR,KLOR-CON) 20 MEQ tablet Take 1 tablet (20 mEq total) by mouth daily. 30 tablet 1   . senna-docusate (SENOKOT-S) 8.6-50 MG per tablet Take 1 tablet by mouth 2 (two) times daily.   Taking  . simethicone (MYLICON) 80 MG chewable tablet Chew 80 mg by mouth at bedtime as needed for flatulence.   Taking  . vitamin C (ASCORBIC ACID) 500 MG tablet Take 500 mg by mouth at bedtime.    Taking    Assessment: 81yo female transferred from [facility] after hip fracture.  Pt on Coumadin PTA for Afib. Pt received 12.5mg  total of PO Vit K on 7/20, 3mg  IV Vit K on 7/21, and 2 units of FFP on 7/21 to reverse INR prior to hip surgery on afternoon of 7/21.  Most  recent INR 1.84 this afternoon prior to surgery, H&H low, PLT WNL, no overt bleeding noted since surgery.  Expect higher doses of Coumadin will be needed in the coming days d/t Vit K and FFP pt received yesterday and today.  Pharmacy now consulted to resume Coumadin for Afib s/p hip surgery.  PTA Coumadin dose: 5mg  daily except NO coumadin on Saturdays (per pt and family)  INR 1.06, H/H 8.8/28.2, Plts wnl, no reported bleeding  Goal of Therapy:  INR 2-3 Monitor platelets by anticoagulation protocol: Yes   Plan:  Start Lovenox 40 mg q24h for DVT ppx per MD Coumadin 7.5mg  x1 tonight Monitor daily INR, CBC, s/sx of bleeding F/u d/c of Lovenox as appropriate  Joya San, PharmD Clinical Pharmacist Pager # [phone] 01/16/2015 11:53 AM

## 2015-01-17 ENCOUNTER — Inpatient Hospital Stay (HOSPITAL_COMMUNITY): Payer: Medicare Other

## 2015-01-17 LAB — COMPREHENSIVE METABOLIC PANEL
ALT: 6 U/L — ABNORMAL LOW (ref 14–54)
AST: 21 U/L (ref 15–41)
Albumin: 2.1 g/dL — ABNORMAL LOW (ref 3.5–5.0)
Alkaline Phosphatase: 59 U/L (ref 38–126)
Anion gap: 9 (ref 5–15)
BILIRUBIN TOTAL: 0.7 mg/dL (ref 0.3–1.2)
BUN: 27 mg/dL — ABNORMAL HIGH (ref 6–20)
CALCIUM: 8.5 mg/dL — AB (ref 8.9–10.3)
CO2: 31 mmol/L (ref 22–32)
CREATININE: 1.31 mg/dL — AB (ref 0.44–1.00)
Chloride: 97 mmol/L — ABNORMAL LOW (ref 101–111)
GFR calc non Af Amer: 36 mL/min — ABNORMAL LOW (ref >60)
GFR, EST AFRICAN AMERICAN: 42 mL/min — AB (ref >60)
Glucose, Bld: 216 mg/dL — ABNORMAL HIGH (ref 65–99)
Potassium: 3.8 mmol/L (ref 3.5–5.1)
Sodium: 137 mmol/L (ref 135–145)
Total Protein: 5.7 g/dL — ABNORMAL LOW (ref 6.5–8.1)

## 2015-01-17 LAB — BASIC METABOLIC PANEL
ANION GAP: 8 (ref 5–15)
BUN: 27 mg/dL — AB (ref 6–20)
CALCIUM: 8.5 mg/dL — AB (ref 8.9–10.3)
CHLORIDE: 97 mmol/L — AB (ref 101–111)
CO2: 34 mmol/L — ABNORMAL HIGH (ref 22–32)
CREATININE: 1.44 mg/dL — AB (ref 0.44–1.00)
GFR calc non Af Amer: 32 mL/min — ABNORMAL LOW (ref >60)
GFR, EST AFRICAN AMERICAN: 37 mL/min — AB (ref >60)
Glucose, Bld: 152 mg/dL — ABNORMAL HIGH (ref 65–99)
Potassium: 3.9 mmol/L (ref 3.5–5.1)
Sodium: 139 mmol/L (ref 135–145)

## 2015-01-17 LAB — GLUCOSE, CAPILLARY
GLUCOSE-CAPILLARY: 216 mg/dL — AB (ref 65–99)
GLUCOSE-CAPILLARY: 115 mg/dL — AB (ref 65–99)
Glucose-Capillary: 155 mg/dL — ABNORMAL HIGH (ref 65–99)
Glucose-Capillary: 136 mg/dL — ABNORMAL HIGH (ref 65–99)

## 2015-01-17 LAB — CBC
HCT: 27.8 % — ABNORMAL LOW (ref 36.0–46.0)
HEMOGLOBIN: 8.8 g/dL — AB (ref 12.0–15.0)
MCH: 30 pg (ref 26.0–34.0)
MCHC: 31.7 g/dL (ref 30.0–36.0)
MCV: 94.9 fL (ref 78.0–100.0)
PLATELETS: 211 10*3/uL (ref 150–400)
RBC: 2.93 MIL/uL — AB (ref 3.87–5.11)
RDW: 14.9 % (ref 11.5–15.5)
WBC: 9.5 10*3/uL (ref 4.0–10.5)

## 2015-01-17 LAB — PROTIME-INR
INR: 1.77 — AB (ref 0.00–1.49)
PROTHROMBIN TIME: 20.6 s — AB (ref 11.6–15.2)

## 2015-01-17 LAB — TYPE AND SCREEN
ABO/RH(D): A POS
Antibody Screen: NEGATIVE
Unit division: 0

## 2015-01-17 LAB — TROPONIN I
TROPONIN I: 0.29 ng/mL — AB (ref <0.031)
TROPONIN I: 0.3 ng/mL — AB (ref <0.031)

## 2015-01-17 LAB — HEMOGLOBIN AND HEMATOCRIT, BLOOD
HEMATOCRIT: 27 % — AB (ref 36.0–46.0)
Hemoglobin: 8.6 g/dL — ABNORMAL LOW (ref 12.0–15.0)

## 2015-01-17 MED ORDER — LORAZEPAM 2 MG/ML IJ SOLN
0.5000 mg | Freq: Once | INTRAMUSCULAR | Status: AC
  Administered 2015-01-17: 0.5 mg via INTRAVENOUS
  Filled 2015-01-17: qty 1

## 2015-01-17 MED ORDER — SODIUM CHLORIDE 0.9 % IV SOLN
INTRAVENOUS | Status: DC
  Administered 2015-01-17 (×2): via INTRAVENOUS
  Administered 2015-01-18: 50 mL/h via INTRAVENOUS
  Administered 2015-01-19 – 2015-01-20 (×2): via INTRAVENOUS

## 2015-01-17 MED ORDER — WARFARIN SODIUM 5 MG PO TABS
5.0000 mg | ORAL_TABLET | Freq: Once | ORAL | Status: AC
  Administered 2015-01-17: 5 mg via ORAL
  Filled 2015-01-17: qty 1

## 2015-01-17 NOTE — Progress Notes (Signed)
Subjective: 2 Days Post-Op Procedure(s) (LRB): INTRAMEDULLARY (IM) NAIL FEMORAL LEFT  (Left) Patient reports pain as moderate.  Reports pain L hip. No other c/o. Seen by myself and Dr. Gladstone Lighter in AM rounds.  Objective: Vital signs in last 24 hours: Temp:  [97.3 F (36.3 C)-99.4 F (37.4 C)] 97.6 F (36.4 C) (07/23 0741) Pulse Rate:  [64-76] 74 (07/23 0741) Resp:  [14-17] 15 (07/23 0741) BP: (66-112)/(37-72) 103/40 mmHg (07/23 0741) SpO2:  [93 %-100 %] 95 % (07/23 0741)  Intake/Output from previous day: 07/22 0701 - 07/23 0700 In: 1235 [Blood:335; IV Piggyback:900] Out: 725 [Urine:725] Intake/Output this shift:     Recent Labs  01/15/15 0400 01/16/15 0302 01/16/15 1710 01/17/15 0130 01/17/15 0445  HGB 9.6* 8.8* 7.4* 8.6* 8.8*    Recent Labs  01/16/15 1710 01/17/15 0130 01/17/15 0445  WBC 8.2  --  9.5  RBC 2.43*  --  2.93*  HCT 23.5* 27.0* 27.8*  PLT 200  --  211    Recent Labs  01/16/15 0302 01/17/15 0445  NA 143 139  K 3.4* 3.9  CL 98* 97*  CO2 34* 34*  BUN 16 27*  CREATININE 1.06* 1.44*  GLUCOSE 101* 152*  CALCIUM 8.8* 8.5*    Recent Labs  01/16/15 0302 01/17/15 0445  INR 1.24 1.77*    Neurologically intact ABD soft Neurovascular intact Sensation intact distally Intact pulses distally Dorsiflexion/Plantar flexion intact Incision: dressing C/D/I and scant drainage No cellulitis present Compartment soft no sign of DVT  Assessment/Plan: 2 Days Post-Op Procedure(s) (LRB): INTRAMEDULLARY (IM) NAIL FEMORAL LEFT  (Left) Advance diet Up with therapy D/C IV fluids  Coumadin for DVT ppx and PAF Plan D/C to SNF when stable and ready per medical service  Lacie Draft M. 01/17/2015, 8:01 AM

## 2015-01-17 NOTE — Progress Notes (Addendum)
[name]  [name] NKN:[id number] DOB: [birth year] DOA: 01/13/2015 PCP: Redge Gainer, MD  Assessment/Plan: Active Problems:   PAF, recurrance this admission, on chronic Amio/ Coumadin   COPD with emphysema   Chronic respiratory failure with hypoxia   HTN (hypertension)   CAD, remote RCA PCI, subsequently noted to be occluded, last cath 8/11   DM type 2 causing CKD stage 3   Hip fracture   Preop cardiovascular exam   Hip fracture requiring operative repair    Mechanical fall,Left intertrochanteric femur fracture/neck pain Patient became hypoxic requiring 100% nonrebreather in the PACU post surgery Transferred to step down and BiPAP ordered however the patient refused BiPAP and was combative and agitated Patient is oriented and calm this morning, physical therapy recommends SNF Patient would need discharge to SNF Patient received FFP and vitamin K prior to surgery Some improvement in headache and neck pain,  CT head and neck negative for intracranial bleed and fracture MRI of the cervical spine does not show any ligamentous disruption, shows facet arthropathy Dilaudid and baclofen for headache and neck pain,    Abnormal troponin Likely demand ischemia from anemia and hypotension  cardiology feels that the patient is stable from a cardiac standpoint and has signed off Troponin elevation is flat We will repeat 2-D echo to rule out wall motion abnormalities Continue aspirin  Acute Hypoxemic respiratory failure, improving Combination of GOLD stage IV COPD and pulmonary edema/acute on chronic diastolic CHF  Most recent echo 12/2013 showed LV EF of 55-60% Lasix on hold because of low blood pressure Patient also has atelectasis, currently requiring 5 to 6 L of oxygen Start the patient on incentive spirometry and mobilize as tolerated Repeat chest x-ray shows mild CHF  History of coronary artery disease, chronic diastolic congestive heart failure  without exacerbation, paroxysmal atrial fibrillation, peripheral vascular disease Cardiology consulted for clearance prior to surgery EKG shows first-degree AV block, right bundle branch block Patient will be at increased risk for surgery, however no active chest pain Cardiology recommends to proceed without further cardiac evaluation Continue amiodarone for atrial fibrillation  Acute blood loss anemia  during surgery- hg dropped from 11.2 to 7.4 today , POD#1 Status post transfusion of one unit of packed red blood cells 7/22 Repeat CBC in a.m.   Supratherapeutic INR, now subtherapeutic Patient received a total of 15 mg of vitamin K since her admission to reverse coagulopathy for surgery and received FFP INR subtherapeutic this morning, continue Coumadin Monitor INR closely , DC Lovenox if hemoglobin drops further    Peripheral vascular disease, on aspirin at home  Diabetes continue NovoLog and Levemir, CBG stable  Hypertension holding  home medications as pt hypotensive after receiving lasix overnight , patient required fluid boluses and packed red blood cells to stabilize her blood pressure. Blood pressure is better this morning, hypotension seems to have resolved low threshold to start vasopressors if  blood pressure drops again , Continue to hold antihypertensives medications   Oxygen dependent COPD, continue Symbicort, when necessary nebulizer treatments      Code Status:      Code Status Orders        Start     Ordered   01/13/15 2020  Full code   Continuous     01/13/15 2019     Family Communication: family updated about patient's clinical progress Disposition Plan:  Continue step down, transferred to telemetry tomorrow   Brief narrative: [name] is a  81 y.o. female with a history of CAD, diastolic CHF, chronic RBBB, Paroxysmal AF (on coumadin and low dose amiodarone), PVD (moderate carotid and LSCA disease), DM2, HTN, HL,COPD on home O2, anxiety,  former smoker and DM who was admitted to [facility] on 7/14 for headaches and neck pain. W/U showed EColi UTI and pt received IV Rocephin. Head CT showed old lacunar infarct. She became confused/ delirium in the hospital. She had a fell 7/18 and broke her L hip (intertrochanteric fx). She was transferred to [facility] for ortho eval and surgery. She was ambulatory with a walker and lived independently at home before her recent hospitalization. She denies any chest pain or palpitation.   Heart cath in 2006 showed an occluded RCA with left-to-right collaterals & otherwise noncritical CAD with EF=25-30% at that time. Her last cath was performed by Dr. Ellouise Newer 01/25/2010 revealed a chronically totally occluded dominant RCA, 50% proximal circumflex with left to right collaterals and normal LV function > medical therapy recommended.  Most recent echo 12/2013 showed LV EF of 55-60%, mild LVH, interobasal akinesis, grade 2 DD, moderate LAE and mild MR. She was doing well on cardiac stand point of view when last seen by Dr. Gwenlyn Found 11/2013.  Consultants:  Cardiology  Orthopedics    Procedures:  None  Antibiotics: Anti-infectives    Start     Dose/Rate Route Frequency Ordered Stop   01/15/15 2100  ceFAZolin (ANCEF) IVPB 2 g/50 mL premix     2 g 100 mL/hr over 30 Minutes Intravenous Every 6 hours 01/15/15 2027 01/16/15 0310   01/15/15 1430  ceFAZolin (ANCEF) IVPB 2 g/50 mL premix     2 g 100 mL/hr over 30 Minutes Intravenous To [facility] 01/14/15 1259 01/15/15 1450         HPI/Subjective: Patient is much more awake alert and oriented today, blood pressure still soft but stable, better after receiving transfusion last night  Objective: Filed Vitals:   01/17/15 0428 01/17/15 0500 01/17/15 0718 01/17/15 0741  BP: 98/47 112/48  103/40  Pulse: 69 69  74  Temp: 98.3 F (36.8 C)   97.6 F (36.4 C)  TempSrc: Axillary   Oral  Resp: 16 16  15   Height:      Weight:      SpO2: 97% 98%  94% 95%    Intake/Output Summary (Last 24 hours) at 01/17/15 1018 Last data filed at 01/17/15 0427  Gross per 24 hour  Intake   1135 ml  Output    725 ml  Net    410 ml    Exam:  General: Uncomfortable Lungs: Clear to auscultation bilaterally without wheezes or crackles Cardiovascular: Regular rate and rhythm without murmur gallop or rub normal S1 and S2 Abdomen: Nontender, nondistended, soft, bowel sounds positive, no rebound, no ascites, no appreciable mass Extremities: No significant cyanosis, clubbing, or edema bilateral lower extremities     Data Review   Micro Results Recent Results (from the past 240 hour(s))  Surgical pcr screen     Status: None   Collection Time: 01/15/15  6:10 AM  Result Value Ref Range Status   MRSA, PCR NEGATIVE NEGATIVE Final   Staphylococcus aureus NEGATIVE NEGATIVE Final    Comment:        The Xpert SA Assay (FDA approved for NASAL specimens in patients over 58 years of age), is one component of a comprehensive surveillance program.  Test performance has been validated by [facility] for patients greater than or  equal to 23 year old. It is not intended to diagnose infection nor to guide or monitor treatment.   MRSA PCR Screening     Status: None   Collection Time: 01/15/15  8:56 PM  Result Value Ref Range Status   MRSA by PCR NEGATIVE NEGATIVE Final    Comment:        The GeneXpert MRSA Assay (FDA approved for NASAL specimens only), is one component of a comprehensive MRSA colonization surveillance program. It is not intended to diagnose MRSA infection nor to guide or monitor treatment for MRSA infections.     Radiology Reports Ct Head Wo Contrast  01/14/2015   ADDENDUM REPORT: 01/14/2015 12:08  ADDENDUM: Study discussed by telephone with Dr. Reyne Dumas on 01/14/2015 at 1204 hours.   Electronically Signed   By: Genevie Ann M.D.   On: 01/14/2015 12:08   01/14/2015   CLINICAL DATA:  81 year old female who fell and was  unconscious. Headache and cervical neck pain. Initial encounter.  EXAM: CT HEAD WITHOUT CONTRAST  CT CERVICAL SPINE WITHOUT CONTRAST  TECHNIQUE: Multidetector CT imaging of the head and cervical spine was performed following the standard protocol without intravenous contrast. Multiplanar CT image reconstructions of the cervical spine were also generated.  COMPARISON:  [facility] CT without contrast 718 2016, 01/08/2015. Cervical spine radiographs 09/05/2013. Cervical spine CT 07/10/2011.  FINDINGS: CT HEAD FINDINGS  Paranasal sinuses and mastoids appear stable and clear. Periapical left maxillary molar dental lucency appears inflammatory in nature. No calvarium fracture identified. No scalp hematoma. Orbits soft tissues appear stable.  Chronic Calcified atherosclerosis at the skull base. Cerebral volume is stable and normal for age. No ventriculomegaly. No midline shift, mass effect, or evidence of intracranial mass lesion. No acute intracranial hemorrhage identified. No evidence of cortically based acute infarction identified. Normal for age gray-white matter differentiation.  CT CERVICAL SPINE FINDINGS  Multilevel chronic cervical spondylolisthesis. Pronounced chronic anterolisthesis at C3-C4 appears stable since 2013. Mild anterolisthesis at C2-C3 and C7-T1 also appear stable. Associated chronic cervical facet arthropathy. Bilateral cervical posterior element alignment appears stable. Superimposed severe chronic lower cervical disc and endplate with chronic endplate osteophytosis. Degeneration occipital condyles to C1 alignment appears stable. Chronic C1-C2 degeneration may be mildly progressed since 2013. Stable C1-C2 alignment degenerative odontoid tip subchondral cysts do appear increased. No acute cervical spine fracture identified.  However, there is a small volume of abnormal prevertebral soft tissue fluid which is new. This is primarily about the C4 level (series 5, image 48).  Chronic  calcified carotid atherosclerosis in the neck. Stable other noncontrast paraspinal soft tissues. Grossly intact visualized upper thoracic levels.  IMPRESSION: 1. New abnormal prevertebral fluid at the C4 level, suspicious for acute cervical anterior ligamentous injury in this setting. Cervical spine MRI without contrast would evaluate further. 2. No acute fracture or listhesis identified in the cervical spine. Chronic advanced cervical spondylolisthesis and degenerative changes. 3. Stable and negative for age noncontrast CT appearance of the brain.  Electronically Signed: By: Genevie Ann M.D. On: 01/14/2015 11:54   Ct Cervical Spine Wo Contrast  01/14/2015   ADDENDUM REPORT: 01/14/2015 12:08  ADDENDUM: Study discussed by telephone with Dr. Reyne Dumas on 01/14/2015 at 1204 hours.   Electronically Signed   By: Genevie Ann M.D.   On: 01/14/2015 12:08   01/14/2015   CLINICAL DATA:  81 year old female who fell and was unconscious. Headache and cervical neck pain. Initial encounter.  EXAM: CT HEAD WITHOUT CONTRAST  CT CERVICAL  SPINE WITHOUT CONTRAST  TECHNIQUE: Multidetector CT imaging of the head and cervical spine was performed following the standard protocol without intravenous contrast. Multiplanar CT image reconstructions of the cervical spine were also generated.  COMPARISON:  [facility] CT without contrast 718 2016, 01/08/2015. Cervical spine radiographs 09/05/2013. Cervical spine CT 07/10/2011.  FINDINGS: CT HEAD FINDINGS  Paranasal sinuses and mastoids appear stable and clear. Periapical left maxillary molar dental lucency appears inflammatory in nature. No calvarium fracture identified. No scalp hematoma. Orbits soft tissues appear stable.  Chronic Calcified atherosclerosis at the skull base. Cerebral volume is stable and normal for age. No ventriculomegaly. No midline shift, mass effect, or evidence of intracranial mass lesion. No acute intracranial hemorrhage identified. No evidence of  cortically based acute infarction identified. Normal for age gray-white matter differentiation.  CT CERVICAL SPINE FINDINGS  Multilevel chronic cervical spondylolisthesis. Pronounced chronic anterolisthesis at C3-C4 appears stable since 2013. Mild anterolisthesis at C2-C3 and C7-T1 also appear stable. Associated chronic cervical facet arthropathy. Bilateral cervical posterior element alignment appears stable. Superimposed severe chronic lower cervical disc and endplate with chronic endplate osteophytosis. Degeneration occipital condyles to C1 alignment appears stable. Chronic C1-C2 degeneration may be mildly progressed since 2013. Stable C1-C2 alignment degenerative odontoid tip subchondral cysts do appear increased. No acute cervical spine fracture identified.  However, there is a small volume of abnormal prevertebral soft tissue fluid which is new. This is primarily about the C4 level (series 5, image 48).  Chronic calcified carotid atherosclerosis in the neck. Stable other noncontrast paraspinal soft tissues. Grossly intact visualized upper thoracic levels.  IMPRESSION: 1. New abnormal prevertebral fluid at the C4 level, suspicious for acute cervical anterior ligamentous injury in this setting. Cervical spine MRI without contrast would evaluate further. 2. No acute fracture or listhesis identified in the cervical spine. Chronic advanced cervical spondylolisthesis and degenerative changes. 3. Stable and negative for age noncontrast CT appearance of the brain.  Electronically Signed: By: Genevie Ann M.D. On: 01/14/2015 11:54   Mr Cervical Spine Wo Contrast  01/14/2015   CLINICAL DATA:  Headaches and neck pain.  Fall 01/12/2015.  EXAM: MRI CERVICAL SPINE WITHOUT CONTRAST  TECHNIQUE: Multiplanar, multisequence MR imaging of the cervical spine was performed. No intravenous contrast was administered.  COMPARISON:  Cervical spine CT 01/14/2015 and MRI 07/22/2005  FINDINGS: Study is mildly to moderately motion degraded  despite repeating some sequences.  There is mild prevertebral STIR hyperintensity at C3-4 compatible with edema as seen on CT earlier today. No frank disruption of the anterior or posterior longitudinal ligaments is identified. No posterior soft tissue edema suggestive of posterior ligamentous complex injury is identified. No vertebral marrow edema is seen to suggest acute osseous injury.  Vertebral alignment is unchanged from the 2007 MRI with grade 1 anterolisthesis of C2 on C3, C3 on C4, and C7 on T1. Vertebral body heights are preserved. Moderate to severe disc space narrowing is present from C4-5 to C6-7. Craniocervical junction is unremarkable. Cervical spinal cord is normal in signal. 11 mm T2 hyperintense right thyroid nodule is noted.  C2-3: Advanced right and mild left facet arthrosis, mild disc uncovering, and infolding of the ligamentum flavum result in moderate spinal stenosis, increased from the prior MRI.  C3-4: Anterolisthesis with disc uncovering and advanced bilateral facet arthrosis result in mild spinal stenosis, slightly more prominent than on the prior MRI. There is at least moderate bilateral neural foraminal stenosis, increased from prior MRI.  C4-5: Broad-based posterior disc osteophyte complex and  uncovertebral spurring result in mild spinal stenosis asymmetric to the right and moderate to severe right and moderate left neural foraminal stenosis, grossly similar to the prior MRI.  C5-6: Broad-based posterior disc osteophyte complex asymmetric to the right and uncovertebral spurring results in mild spinal stenosis in severe bilateral neural foraminal stenosis, similar to the prior MRI.  C6-7: Broad-based posterior disc osteophyte complex and uncovertebral spurring result in mild spinal stenosis and moderate bilateral neural foraminal stenosis. The neural foraminal stenosis may have mildly progressed from the prior MRI.  C7-T1: Slight anterolisthesis and disc uncovering without significant  stenosis, unchanged.  IMPRESSION: 1. Motion degraded examination. Mild prevertebral edema in the upper cervical spine without frank ligamentous disruption identified. 2. Multilevel cervical disc and facet degeneration as above, mildly progressed from the prior MRI.   Electronically Signed   By: Logan Bores   On: 01/14/2015 20:48   Pelvis Portable  01/15/2015   CLINICAL DATA:  81 year old female with post of for left hip fracture.  EXAM: PORTABLE PELVIS 1-2 VIEWS  COMPARISON:  Radiograph dated 12/1914  FINDINGS: There has been interval placement of an intra medullary rod and screw traversing femoral intertrochanteric fracture. The bones are osteopenic. There are osteoarthritic changes of the hip joints. No acute fracture identified.  IMPRESSION: Left femoral intertrochanteric fracture status post placement of orthopedic hardware.   Electronically Signed   By: Anner Crete M.D.   On: 01/15/2015 18:01   Dg Chest Port 1 View  01/17/2015   CLINICAL DATA:  Shortness of Breath  EXAM: PORTABLE CHEST - 1 VIEW  COMPARISON:  01/15/2015  FINDINGS: Cardiac shadow is again enlarged. Mild vascular congestion is seen. No focal confluent infiltrate is noted. No acute bony abnormality is seen.  IMPRESSION: Mild CHF.   Electronically Signed   By: Inez Catalina M.D.   On: 01/17/2015 08:45   Dg Chest Port 1 View  01/15/2015   CLINICAL DATA:  Decreased oxygen saturation following surgery.  EXAM: PORTABLE CHEST - 1 VIEW  COMPARISON:  01/13/2015; 01/12/2015; 12/27/2013  FINDINGS: Grossly unchanged enlarged cardiac silhouette and mediastinal contours with atherosclerotic plaque within the thoracic aorta. Lung volumes are reduced with worsening bibasilar heterogeneous/consolidative opacities, left greater than right. Pulmonary vasculature appears less distinct than present examination. Trace bilateral effusions are not excluded. No pneumothorax. Unchanged bones.  IMPRESSION: Suspected mild worsening pulmonary edema and bibasilar  atelectasis on this hypoventilated AP portable examination. Further evaluation with a PA and lateral chest radiograph may be obtained as clinically indicated.   Electronically Signed   By: Sandi Mariscal M.D.   On: 01/15/2015 18:32   Chest Portable 1 View  01/13/2015   CLINICAL DATA:  Preop for left femur fracture  EXAM: PORTABLE CHEST - 1 VIEW  COMPARISON:  01/12/2015  FINDINGS: Moderate to severe cardiac enlargement. Calcified aortic arch. Mild vascular congestion. No evidence of consolidation or edema. Minimal right lower lobe scarring or atelectasis.  IMPRESSION: No acute findings. Stable cardiac enlargement with vascular congestion but no pulmonary edema.   Electronically Signed   By: Skipper Cliche M.D.   On: 01/13/2015 21:50   Dg Hip Operative Unilat With Pelvis Left  01/15/2015   CLINICAL DATA:  Patient with recent diagnosis mildly displaced left femoral intertrochanteric fracture  EXAM: OPERATIVE LEFT HIP (WITH PELVIS IF PERFORMED) 4 VIEWS  TECHNIQUE: Fluoroscopic spot image(s) were submitted for interpretation post-operatively.  COMPARISON:  Hip radiograph 01/13/2015  FINDINGS: Patient status post intra medullary rod and screw fixation of comminuted proximal left  femur fracture involving the intertrochanteric region. Hardware appears in appropriate position. Improved anatomic alignment.  IMPRESSION: Patient status post ORIF proximal left femur fracture.   Electronically Signed   By: Lovey Newcomer M.D.   On: 01/15/2015 15:40     CBC  Recent Labs Lab 01/13/15 2119 01/15/15 0400 01/16/15 0302 01/16/15 1710 01/17/15 0130 01/17/15 0445  WBC 8.3 7.1 9.0 8.2  --  9.5  HGB 11.2* 9.6* 8.8* 7.4* 8.6* 8.8*  HCT 36.4 31.3* 28.2* 23.5* 27.0* 27.8*  PLT 222 227 218 200  --  211  MCV 99.5 98.1 97.6 96.7  --  94.9  MCH 30.6 30.1 30.4 30.5  --  30.0  MCHC 30.8 30.7 31.2 31.5  --  31.7  RDW 13.7 13.6 13.6 13.5  --  14.9    Chemistries   Recent Labs Lab 01/13/15 2119 01/15/15 0400 01/16/15 0302  01/17/15 0445  NA 143 143 143 139  K 3.9 3.7 3.4* 3.9  CL 104 100* 98* 97*  CO2 27 33* 34* 34*  GLUCOSE 153* 99 101* 152*  BUN 27* 21* 16 27*  CREATININE 1.32* 1.10* 1.06* 1.44*  CALCIUM 8.8* 9.2 8.8* 8.5*  AST 24 17 22   --   ALT 18 16 16   --   ALKPHOS 71 64 63  --   BILITOT 0.9 0.8 0.7  --    ------------------------------------------------------------------------------------------------------------------ estimated creatinine clearance is 25.4 mL/min (by C-G formula based on Cr of 1.44). ------------------------------------------------------------------------------------------------------------------ No results for input(s): HGBA1C in the last 72 hours. ------------------------------------------------------------------------------------------------------------------ No results for input(s): CHOL, HDL, LDLCALC, TRIG, CHOLHDL, LDLDIRECT in the last 72 hours. ------------------------------------------------------------------------------------------------------------------ No results for input(s): TSH, T4TOTAL, T3FREE, THYROIDAB in the last 72 hours.  Invalid input(s): FREET3 ------------------------------------------------------------------------------------------------------------------ No results for input(s): VITAMINB12, FOLATE, FERRITIN, TIBC, IRON, RETICCTPCT in the last 72 hours.  Coagulation profile  Recent Labs Lab 01/14/15 1623 01/15/15 0400 01/15/15 1228 01/16/15 0302 01/17/15 0445  INR 5.65* 3.34* 1.84* 1.24 1.77*    No results for input(s): DDIMER in the last 72 hours.  Cardiac Enzymes  Recent Labs Lab 01/16/15 1832 01/17/15 0130 01/17/15 0445  TROPONINI 0.27* 0.29* 0.30*   ------------------------------------------------------------------------------------------------------------------ Invalid input(s): POCBNP   CBG:  Recent Labs Lab 01/16/15 0742 01/16/15 1314 01/16/15 1712 01/16/15 2308 01/17/15 0739  GLUCAP 126* 236* 188* 141* 155*        Studies: Pelvis Portable  01/15/2015   CLINICAL DATA:  81 year old female with post of for left hip fracture.  EXAM: PORTABLE PELVIS 1-2 VIEWS  COMPARISON:  Radiograph dated 12/1914  FINDINGS: There has been interval placement of an intra medullary rod and screw traversing femoral intertrochanteric fracture. The bones are osteopenic. There are osteoarthritic changes of the hip joints. No acute fracture identified.  IMPRESSION: Left femoral intertrochanteric fracture status post placement of orthopedic hardware.   Electronically Signed   By: Anner Crete M.D.   On: 01/15/2015 18:01   Dg Chest Port 1 View  01/17/2015   CLINICAL DATA:  Shortness of Breath  EXAM: PORTABLE CHEST - 1 VIEW  COMPARISON:  01/15/2015  FINDINGS: Cardiac shadow is again enlarged. Mild vascular congestion is seen. No focal confluent infiltrate is noted. No acute bony abnormality is seen.  IMPRESSION: Mild CHF.   Electronically Signed   By: Inez Catalina M.D.   On: 01/17/2015 08:45   Dg Chest Port 1 View  01/15/2015   CLINICAL DATA:  Decreased oxygen saturation following surgery.  EXAM: PORTABLE CHEST - 1 VIEW  COMPARISON:  01/13/2015;  01/12/2015; 12/27/2013  FINDINGS: Grossly unchanged enlarged cardiac silhouette and mediastinal contours with atherosclerotic plaque within the thoracic aorta. Lung volumes are reduced with worsening bibasilar heterogeneous/consolidative opacities, left greater than right. Pulmonary vasculature appears less distinct than present examination. Trace bilateral effusions are not excluded. No pneumothorax. Unchanged bones.  IMPRESSION: Suspected mild worsening pulmonary edema and bibasilar atelectasis on this hypoventilated AP portable examination. Further evaluation with a PA and lateral chest radiograph may be obtained as clinically indicated.   Electronically Signed   By: Sandi Mariscal M.D.   On: 01/15/2015 18:32   Dg Hip Operative Unilat With Pelvis Left  01/15/2015   CLINICAL DATA:  Patient  with recent diagnosis mildly displaced left femoral intertrochanteric fracture  EXAM: OPERATIVE LEFT HIP (WITH PELVIS IF PERFORMED) 4 VIEWS  TECHNIQUE: Fluoroscopic spot image(s) were submitted for interpretation post-operatively.  COMPARISON:  Hip radiograph 01/13/2015  FINDINGS: Patient status post intra medullary rod and screw fixation of comminuted proximal left femur fracture involving the intertrochanteric region. Hardware appears in appropriate position. Improved anatomic alignment.  IMPRESSION: Patient status post ORIF proximal left femur fracture.   Electronically Signed   By: Lovey Newcomer M.D.   On: 01/15/2015 15:40      Lab Results  Component Value Date   HGBA1C 7.3 11/04/2014   HGBA1C 7..2% 06/12/2014   HGBA1C 7.0* 12/28/2013   Lab Results  Component Value Date   LDLCALC 48 11/04/2014   CREATININE 1.44* 01/17/2015       Scheduled Meds: . sodium chloride   Intravenous Once  . amiodarone  100 mg Oral Daily  . aspirin  81 mg Oral Daily  . atorvastatin  40 mg Oral q1800  . budesonide-formoterol  1 puff Inhalation BID  . cholecalciferol  1,000 Units Oral Daily  . enoxaparin (LOVENOX) injection  40 mg Subcutaneous Q24H  . feeding supplement (GLUCERNA SHAKE)  237 mL Oral BID BM  . ferrous sulfate  325 mg Oral TID PC  . fluticasone  1 spray Each Nare BID  . insulin aspart  0-5 Units Subcutaneous QHS  . insulin aspart  0-9 Units Subcutaneous TID WC  . insulin detemir  8 Units Subcutaneous q morning - 10a  . isosorbide mononitrate  60 mg Oral Daily  . LORazepam  0.5 mg Intravenous Once  . NIFEdipine  30 mg Oral Daily  . pantoprazole  40 mg Oral BID  . potassium chloride SA  20 mEq Oral Daily  . senna-docusate  1 tablet Oral BID  . vitamin C  500 mg Oral QHS  . Warfarin - Pharmacist Dosing Inpatient   Does not apply q1800   Continuous Infusions: . sodium chloride 10 mL/hr at 01/13/15 2029  . lactated ringers 10 mL/hr at 01/15/15 1409    Active Problems:   PAF,  recurrance this admission, on chronic Amio/ Coumadin   COPD with emphysema   Chronic respiratory failure with hypoxia   HTN (hypertension)   CAD, remote RCA PCI, subsequently noted to be occluded, last cath 8/11   DM type 2 causing CKD stage 3   Hip fracture   Preop cardiovascular exam   Hip fracture requiring operative repair    Time spent: 47 minutes   [facility] Pager [phone]. If 7PM-7AM, please contact night-coverage at [email or web address], password [facility] 01/17/2015, 10:18 AM  LOS: 4 days

## 2015-01-17 NOTE — Progress Notes (Signed)
ANTICOAGULATION CONSULT NOTE - Follow-Up Consult  Pharmacy Consult for Warfarin Indication: atrial fibrillation  Allergies  Allergen Reactions  . Atorvastatin Other (See Comments)     muscle aches  . Codeine Nausea And Vomiting  . Morphine Nausea And Vomiting  . Other Other (See Comments)    OPIATES cause nausea and vomiting  . Rosuvastatin Other (See Comments)    muscle aches at high doses, held as of 12/2010 due to aches  . Iohexol Other (See Comments)     Desc: unknown reaction; allergic to iodine and contrast     Patient Measurements: Height: 5\' 1"  (154.9 cm) Weight: 147 lb 4.3 oz (66.8 kg) IBW/kg (Calculated) : 47.8   Vital Signs: Temp: 97.9 F (36.6 C) (07/23 1227) Temp Source: Oral (07/23 1227) BP: 109/47 mmHg (07/23 1300) Pulse Rate: 90 (07/23 1300)  Labs:  Recent Labs  01/15/15 1228 01/16/15 0302 01/16/15 1710 01/16/15 1832 01/17/15 0130 01/17/15 0445 01/17/15 1115  HGB  --  8.8* 7.4*  --  8.6* 8.8*  --   HCT  --  28.2* 23.5*  --  27.0* 27.8*  --   PLT  --  218 200  --   --  211  --   LABPROT 21.2* 15.8*  --   --   --  20.6*  --   INR 1.84* 1.24  --   --   --  1.77*  --   CREATININE  --  1.06*  --   --   --  1.44* 1.31*  TROPONINI  --   --   --  0.27* 0.29* 0.30*  --     Estimated Creatinine Clearance: 28 mL/min (by C-G formula based on Cr of 1.31).   Medical History: Past Medical History  Diagnosis Date  . Hyperlipidemia   . Hypertension   . Anxiety   . GERD (gastroesophageal reflux disease)   . CAD (coronary artery disease)     Cath in 2006 showed an occluded RCA with left-to-right collaterals & otherwise noncritical CAD with EF=25-30% at that time. EF subsequently improved to normal by 2D ECHO. Cath Aug. 2011 showed unchanged anatomy. > medical therapy recommended  . Diastolic dysfunction     Grade II  . RBBB (right bundle branch block)     Chronic  . Hypoxemic respiratory failure, chronic     uses 2.5 liters of oxygen with sleep  .  COPD (chronic obstructive pulmonary disease)     GOLD 4 - PFT 06/08/10 FEV1 0.93 (62%), FEV1% 60, TLC 2.84 (69%0, DLCO 54%, +BD) On home O2.  . Secondary pulmonary hypertension   . Edema   . Angina   . Shortness of breath   . Bradycardia 08/24/2011  . Carotid artery disease     Moderate, right greater than left which we following by duplex ultrasound. [place] 12/05/11 = RIght Bulb/Prox ICA: Moderate to severe amt of fibrous plaque elevating velocities w/in prox segment of ICA. Consistent w/a 50-69% diameter reduction. Left Bulb/Prox ICA: Moderate amt of fibrous plaque slightly elevating velocities w/in the prox segment of the ICA. Consistent w/a 0-49% diameter reduction.   Marland Kitchen PAF (paroxysmal atrial fibrillation)     In the past, on coumadin  . History of stress test 07/02/09    Mild perfusion defect seen in the Basal Inferior region(s). This is consistent with an infarct/scar.  Post-stress EF=56%. Global LV systolic function is normal.  EKG is negative for ischemia.  No significant iscemia detected. Low risk scan.  . Lower  extremity edema     ECHO 07/21/12 = EF 55-60%, performed for TIA. Responding to diurectics. Continue diuresis w/ a goal dry weight of <160lb. Venous Duplex 07/27/11 = Right lower extremity: no evidence of thrombus or thrombophlebitis. Essentially normal right lower extremity venous duplex Doppler evaluation.  . Carotid bruit   . Mitral valve regurgitation     Mild to moderate by ECHO 07/21/12  . Sleep-related hypoventilation   . COPD with emphysema   . Coronary atherosclerosis of unspecified type of vessel, native or graft   . PONV (postoperative nausea and vomiting)   . Family history of adverse reaction to anesthesia     "daughter gets PONV"  . CHF (congestive heart failure)     Hospitalized 08/22/11-08/26/11 with CHF secondary to diastolic dysfunction & hospitilized in 07/2012 with a similar problem. TTE 08/23/11 = normal EF.  Marland Kitchen Myocardial infarction     "she's had several" (01/13/2015)   . On home oxygen therapy     "?L; prn" (01/13/2015)  . Pneumonia ~ 2013  . Type II diabetes mellitus     goal A1C is 8, to avoid hypoglycemia  . Sleep apnea   . Stroke     "/CT scan; ~ 2014; didn't even know she'd had it"  (01/13/2015)  . Arthritis     "all over"  . Chronic lower back pain   . Bleeds easily   . Fall during current hospitalization     "was at [facility]; fell; transferred to [facility]" (01/13/2015)    Medications:  Prescriptions prior to admission  Medication Sig Dispense Refill Last Dose  . warfarin (COUMADIN) 5 MG tablet TAKE (1) TABLET DAILY AFTER SUPPER. (Patient taking differently: TAKE (1) TABLET DAILY AFTER SUPPER EXCEPT ON SATURDAY. NO MEDICATION ON SATURDAY) 30 tablet 2 unknown  . ALPRAZolam (XANAX) 0.25 MG tablet TAKE 1/2 TO 1 TABLET 2 TIMES A DAY AS NEEDED FOR ANXIETY 30 tablet 2 Taking  . amiodarone (PACERONE) 200 MG tablet Take 0.5 tablets (100 mg total) by mouth daily. Please keep upcoming appointment 30 tablet 0 Taking  . aspirin 81 MG chewable tablet Chew 81 mg by mouth daily.   Taking  . atorvastatin (LIPITOR) 40 MG tablet Take 1 tablet (40 mg total) by mouth every evening. 30 tablet 8 Taking  . Azelastine HCl 0.15 % SOLN Place 1 spray into both nostrils 2 (two) times daily as needed (congestion).   Taking  . budesonide-formoterol (SYMBICORT) 160-4.5 MCG/ACT inhaler Inhale 1 puff into the lungs 2 (two) times daily. 3 Inhaler 2 Taking  . Cholecalciferol (VITAMIN D PO) Take 1 capsule by mouth daily.    Taking  . fluticasone (FLONASE) 50 MCG/ACT nasal spray Place 1 spray into both nostrils 2 (two) times daily. 16 g 6 Taking  . furosemide (LASIX) 40 MG tablet Take 1 tablet (40 mg total) by mouth daily. May take extra dose if weight increases 3.5 pounds in 24 hrs as needed for fluid and edema. 60 tablet 4 Taking  . glucosamine-chondroitin 500-400 MG tablet Take 1 tablet by mouth 2 (two) times daily.   Taking  . glucose blood (RELION ULTIMA TEST) test strip USE TO  CHECK GLUCOSE UP TO 4 TIMES DAILY 200 each 1 Taking  . insulin aspart (NOVOLOG) 100 UNIT/ML injection Inject 0 to 8 units up to 3 times a day prior to meal per sliding scale 20 mL 2 Taking  . insulin detemir (LEVEMIR) 100 UNIT/ML injection Inject 0.16 mLs (16 Units total) into the skin every  morning. 20 mL 2 Taking  . insulin lispro (HUMALOG KWIKPEN) 100 UNIT/ML KiwkPen Inject 0-8 Units into the skin 3 (three) times daily before meals. Per sliding scale   Not Taking  . Ipratropium-Albuterol (COMBIVENT) 20-100 MCG/ACT AERS respimat Inhale 1 puff into the lungs every 6 (six) hours as needed for wheezing or shortness of breath. 3 Inhaler 2 Taking  . isosorbide mononitrate (IMDUR) 60 MG 24 hr tablet TAKE 1 TABLET DAILY 30 tablet 4 Taking  . metoprolol succinate (TOPROL-XL) 25 MG 24 hr tablet TAKE 1/2 TABLET DAILY 15 tablet 4 Taking  . Multiple Vitamins-Minerals (PRESERVISION/LUTEIN) CAPS Take 1 capsule by mouth daily.    Taking  . NIFEdipine (NIFEDICAL XL) 30 MG 24 hr tablet Take 1 tablet (30 mg total) by mouth daily. PATIENT NEEDS TO CONTACT OFFICE FOR ADDITIONAL REFILLS 30 tablet 0 Taking  . nitroGLYCERIN (NITROSTAT) 0.4 MG SL tablet Place 1 tablet (0.4 mg total) under the tongue every 5 (five) minutes as needed for chest pain. 25 tablet 1 Taking  . pantoprazole (PROTONIX) 40 MG tablet TAKE 1 TABLET TWICE A DAY 60 tablet 4 Taking  . potassium chloride SA (K-DUR,KLOR-CON) 20 MEQ tablet Take 1 tablet (20 mEq total) by mouth daily. 30 tablet 1   . senna-docusate (SENOKOT-S) 8.6-50 MG per tablet Take 1 tablet by mouth 2 (two) times daily.   Taking  . simethicone (MYLICON) 80 MG chewable tablet Chew 80 mg by mouth at bedtime as needed for flatulence.   Taking  . vitamin C (ASCORBIC ACID) 500 MG tablet Take 500 mg by mouth at bedtime.    Taking    Assessment: 81yo female transferred from [facility] after hip fracture.  Pt on Coumadin PTA for Afib. Pt received 12.5mg  total of PO Vit K on 7/20, 3mg  IV Vit K on  7/21, and 2 units of FFP on 7/21 to reverse INR prior to hip surgery on afternoon of 7/21.  Most recent INR 1.84 this afternoon prior to surgery, H&H low, PLT WNL, no overt bleeding noted since surgery.  Expect higher doses of Coumadin will be needed in the coming days d/t Vit K and FFP pt received yesterday and today.  Pharmacy now consulted to resume Coumadin for Afib s/p hip surgery.  PTA Coumadin dose: 5mg  daily except NO coumadin on Saturdays (per pt and family)  INR 1.77, H/H 8.8/27.8, Plts wnl, no reported bleeding  Goal of Therapy:  INR 2-3 Monitor platelets by anticoagulation protocol: Yes   Plan:  Continue Lovenox 40 mg q24h for DVT ppx per MD Coumadin 5mg  x1 tonight Monitor daily INR, CBC, s/sx of bleeding F/u d/c of Lovenox as appropriate  Kee Drudge C. Lennox Grumbles, PharmD Pharmacy Resident  Pager: [phone] 01/17/2015 3:05 PM

## 2015-01-17 NOTE — Progress Notes (Signed)
Orthopedic Tech Progress Note Patient Details:  [name] 1927/11/22 [id number]  Pt unable to use trapeze bar patient helper  [name] 01/17/2015, 7:16 AM

## 2015-01-18 LAB — COMPREHENSIVE METABOLIC PANEL
ALT: 9 U/L — ABNORMAL LOW (ref 14–54)
AST: 22 U/L (ref 15–41)
Albumin: 2.3 g/dL — ABNORMAL LOW (ref 3.5–5.0)
Alkaline Phosphatase: 70 U/L (ref 38–126)
Anion gap: 8 (ref 5–15)
BUN: 20 mg/dL (ref 6–20)
CO2: 32 mmol/L (ref 22–32)
Calcium: 8.9 mg/dL (ref 8.9–10.3)
Chloride: 101 mmol/L (ref 101–111)
Creatinine, Ser: 0.95 mg/dL (ref 0.44–1.00)
GFR calc Af Amer: >60 mL/min (ref >60)
GFR calc non Af Amer: 53 mL/min — ABNORMAL LOW (ref >60)
Glucose, Bld: 146 mg/dL — ABNORMAL HIGH (ref 65–99)
Potassium: 3.8 mmol/L (ref 3.5–5.1)
Sodium: 141 mmol/L (ref 135–145)
Total Bilirubin: 1.2 mg/dL (ref 0.3–1.2)
Total Protein: 6.7 g/dL (ref 6.5–8.1)

## 2015-01-18 LAB — BLOOD GAS, ARTERIAL
Acid-Base Excess: 7.1 mmol/L — ABNORMAL HIGH (ref 0.0–2.0)
Bicarbonate: 31.9 mEq/L — ABNORMAL HIGH (ref 20.0–24.0)
Drawn by: 10552
O2 Content: 6 L/min
O2 Saturation: 93.1 %
Patient temperature: 98.6
TCO2: 33.5 mmol/L (ref 0–100)
pCO2 arterial: 52.9 mmHg — ABNORMAL HIGH (ref 35.0–45.0)
pH, Arterial: 7.398 (ref 7.350–7.450)
pO2, Arterial: 63.5 mmHg — ABNORMAL LOW (ref 80.0–100.0)

## 2015-01-18 LAB — CBC
HCT: 27.9 % — ABNORMAL LOW (ref 36.0–46.0)
Hemoglobin: 8.9 g/dL — ABNORMAL LOW (ref 12.0–15.0)
MCH: 30.3 pg (ref 26.0–34.0)
MCHC: 31.9 g/dL (ref 30.0–36.0)
MCV: 94.9 fL (ref 78.0–100.0)
Platelets: 241 10*3/uL (ref 150–400)
RBC: 2.94 MIL/uL — ABNORMAL LOW (ref 3.87–5.11)
RDW: 14.4 % (ref 11.5–15.5)
WBC: 10.1 10*3/uL (ref 4.0–10.5)

## 2015-01-18 LAB — GLUCOSE, CAPILLARY
GLUCOSE-CAPILLARY: 134 mg/dL — AB (ref 65–99)
Glucose-Capillary: 137 mg/dL — ABNORMAL HIGH (ref 65–99)
Glucose-Capillary: 189 mg/dL — ABNORMAL HIGH (ref 65–99)
Glucose-Capillary: 82 mg/dL (ref 65–99)

## 2015-01-18 LAB — PROTIME-INR
INR: 2.77 — ABNORMAL HIGH (ref 0.00–1.49)
PROTHROMBIN TIME: 28.8 s — AB (ref 11.6–15.2)

## 2015-01-18 MED ORDER — LORAZEPAM 2 MG/ML IJ SOLN
0.5000 mg | Freq: Once | INTRAMUSCULAR | Status: AC
  Administered 2015-01-18: 0.5 mg via INTRAVENOUS
  Filled 2015-01-18: qty 1

## 2015-01-18 MED ORDER — WARFARIN SODIUM 1 MG PO TABS
1.0000 mg | ORAL_TABLET | Freq: Once | ORAL | Status: AC
  Administered 2015-01-18: 1 mg via ORAL
  Filled 2015-01-18: qty 1

## 2015-01-18 MED ORDER — BISACODYL 10 MG RE SUPP: 10.0000 mg | Freq: Every day | RECTAL | Status: DC | PRN

## 2015-01-18 MED ORDER — FUROSEMIDE 10 MG/ML IJ SOLN
20.0000 mg | Freq: Once | INTRAMUSCULAR | Status: AC
  Administered 2015-01-18: 20 mg via INTRAVENOUS
  Filled 2015-01-18: qty 2

## 2015-01-18 NOTE — Progress Notes (Signed)
Pt removed nasal cannula, O2 sats dropped as low as 57 on room air, did not rebound well, up to 78% on 6L [place]. Pt placed on NRB, sats up to 100%.

## 2015-01-18 NOTE — Progress Notes (Signed)
[name]  [name] QXI:[id number] DOB: [birth year] DOA: 01/13/2015 PCP: Redge Gainer, MD  Assessment/Plan: Active Problems:   PAF, recurrance this admission, on chronic Amio/ Coumadin   COPD with emphysema   Chronic respiratory failure with hypoxia   HTN (hypertension)   CAD, remote RCA PCI, subsequently noted to be occluded, last cath 8/11   DM type 2 causing CKD stage 3   Hip fracture   Preop cardiovascular exam   Hip fracture requiring operative repair     Mechanical fall,Left intertrochanteric femur fracture/neck pain Patient became hypoxic requiring 100% nonrebreather in the PACU post surgery Transferred to step down and BiPAP ordered however the patient refused BiPAP and was combative and agitated  physical therapy recommends SNF Patient would need discharge to SNF Patient received FFP and vitamin K prior to surgery Some improvement in headache and neck pain,  CT head and neck negative for intracranial bleed and fracture MRI of the cervical spine does not show any ligamentous disruption, shows facet arthropathy Dilaudid and baclofen for headache and neck pain,    Abnormal troponin Likely demand ischemia from anemia and hypotension  cardiology feels that the patient is stable from a cardiac standpoint and has signed off Troponin elevation is flat 2-D echo pending Continue aspirin  Acute Hypoxemic hypercapnic respiratory failure, waxing and waning Combination of GOLD stage IV COPD and pulmonary edema/acute on chronic diastolic CHF  Most recent echo 12/2013 showed LV EF of 55-60% Resume low dose IV Lasix twice a Patient also has atelectasis, currently requiring 5 to 6 L of oxygen, nonrebreather last night Start the patient on incentive spirometry and mobilize as tolerated Repeat chest x-ray shows mild CHF   History of coronary artery disease, chronic diastolic congestive heart failure without exacerbation, paroxysmal atrial fibrillation,  peripheral vascular disease Cardiology consulted for clearance prior to surgery EKG shows first-degree AV block, right bundle branch block Patient will be at increased risk for surgery, however no active chest pain, Cardiology recommended to proceed with surgery without further cardiac evaluation Continue amiodarone for atrial fibrillation Mild increase in the patient's troponin likely secondary to demand ischemia in the setting of anemia and hypotension Repeat 2-D echo, Hold  imdur  and nifedipine as blood pressure is soft, and diuretics have been resumed   Acute blood loss anemia  during surgery- hg dropped from 11.2 to 7.4 today , on POD#1 Status post transfusion of one unit of packed red blood cells 7/22 Follow-up CBC stable   Supratherapeutic INR, now subtherapeutic Patient received a total of 15 mg of vitamin K since her admission to reverse coagulopathy for surgery and received FFP INR  therapeutic this morning, continue Coumadin, discontinue Lovenox     Peripheral vascular disease, on aspirin at home  Diabetes continue NovoLog and Levemir, CBG stable  Hypertension holding nifedipine ,  IMDUR , Toprol-XL, as pt blood pressure has been borderline low after surgery ,  patient required fluid boluses and packed red blood cells to stabilize her blood pressure.  Blood pressure is better this morning, hypotension seems to have resolved low threshold to start vasopressors if  blood pressure drops again , Continue to hold antihypertensives medications  Acute delirium: multifactorial due to all of the above   Oxygen dependent COPD, continue Symbicort, when necessary nebulizer treatments   Code Status:      Code Status Orders        Start     Ordered   01/13/15 2020  Full code   Continuous     01/13/15 2019     Family Communication: family updated about patient's clinical progress Disposition Plan:  Continue step down, transferred to telemetry if better  tomorrow   Brief narrative: [name] is a 81 y.o. female with a history of CAD, diastolic CHF, chronic RBBB, Paroxysmal AF (on coumadin and low dose amiodarone), PVD (moderate carotid and LSCA disease), DM2, HTN, HL,COPD on home O2, anxiety, former smoker and DM who was admitted to [facility] on 7/14 for headaches and neck pain. W/U showed EColi UTI and pt received IV Rocephin. Head CT showed old lacunar infarct. She became confused/ delirium in the hospital. She had a fell 7/18 and broke her L hip (intertrochanteric fx). She was transferred to [facility] for ortho eval and surgery. She was ambulatory with a walker and lived independently at home before her recent hospitalization. She denies any chest pain or palpitation.   Heart cath in 2006 showed an occluded RCA with left-to-right collaterals & otherwise noncritical CAD with EF=25-30% at that time. Her last cath was performed by Dr. Ellouise Newer 01/25/2010 revealed a chronically totally occluded dominant RCA, 50% proximal circumflex with left to right collaterals and normal LV function > medical therapy recommended.  Most recent echo 12/2013 showed LV EF of 55-60%, mild LVH, interobasal akinesis, grade 2 DD, moderate LAE and mild MR. She was doing well on cardiac stand point of view when last seen by Dr. Gwenlyn Found 11/2013.  Consultants:  Cardiology  Orthopedics    Procedures:  None  Antibiotics: Anti-infectives    Start     Dose/Rate Route Frequency Ordered Stop   01/15/15 2100  ceFAZolin (ANCEF) IVPB 2 g/50 mL premix     2 g 100 mL/hr over 30 Minutes Intravenous Every 6 hours 01/15/15 2027 01/16/15 0310   01/15/15 1430  ceFAZolin (ANCEF) IVPB 2 g/50 mL premix     2 g 100 mL/hr over 30 Minutes Intravenous To [facility] 01/14/15 1259 01/15/15 1450         HPI/Subjective: Patient required a nonrebreather last night, very hypoxic, denies any chest pain, very confused   Objective: Filed Vitals:   01/18/15 0009 01/18/15  0405 01/18/15 0738 01/18/15 0800  BP: 112/64 136/72    Pulse: 74 92    Temp: 97.4 F (36.3 C) 98.4 F (36.9 C)  97.5 F (36.4 C)  TempSrc: Axillary Axillary  Oral  Resp: 21 21    Height:      Weight:      SpO2: 97% 99% 99%     Intake/Output Summary (Last 24 hours) at 01/18/15 0851 Last data filed at 01/18/15 0600  Gross per 24 hour  Intake  967.5 ml  Output   1275 ml  Net -307.5 ml    Exam:  General: Uncomfortable, confused , disoriented  Lungs: Clear to auscultation bilaterally without wheezes or crackles Cardiovascular: Regular rate and rhythm without murmur gallop or rub normal S1 and S2 Abdomen: Nontender, nondistended, soft, bowel sounds positive, no rebound, no ascites, no appreciable mass Extremities: No significant cyanosis, clubbing, or edema bilateral lower extremities     Data Review   Micro Results Recent Results (from the past 240 hour(s))  Surgical pcr screen     Status: None   Collection Time: 01/15/15  6:10 AM  Result Value Ref Range Status   MRSA, PCR NEGATIVE NEGATIVE Final   Staphylococcus aureus NEGATIVE NEGATIVE Final    Comment:  The Xpert SA Assay (FDA approved for NASAL specimens in patients over 31 years of age), is one component of a comprehensive surveillance program.  Test performance has been validated by [facility] for patients greater than or equal to 94 year old. It is not intended to diagnose infection nor to guide or monitor treatment.   MRSA PCR Screening     Status: None   Collection Time: 01/15/15  8:56 PM  Result Value Ref Range Status   MRSA by PCR NEGATIVE NEGATIVE Final    Comment:        The GeneXpert MRSA Assay (FDA approved for NASAL specimens only), is one component of a comprehensive MRSA colonization surveillance program. It is not intended to diagnose MRSA infection nor to guide or monitor treatment for MRSA infections.     Radiology Reports Ct Head Wo Contrast  01/14/2015   ADDENDUM  REPORT: 01/14/2015 12:08  ADDENDUM: Study discussed by telephone with Dr. Reyne Dumas on 01/14/2015 at 1204 hours.   Electronically Signed   By: Genevie Ann M.D.   On: 01/14/2015 12:08   01/14/2015   CLINICAL DATA:  81 year old female who fell and was unconscious. Headache and cervical neck pain. Initial encounter.  EXAM: CT HEAD WITHOUT CONTRAST  CT CERVICAL SPINE WITHOUT CONTRAST  TECHNIQUE: Multidetector CT imaging of the head and cervical spine was performed following the standard protocol without intravenous contrast. Multiplanar CT image reconstructions of the cervical spine were also generated.  COMPARISON:  [facility] CT without contrast 718 2016, 01/08/2015. Cervical spine radiographs 09/05/2013. Cervical spine CT 07/10/2011.  FINDINGS: CT HEAD FINDINGS  Paranasal sinuses and mastoids appear stable and clear. Periapical left maxillary molar dental lucency appears inflammatory in nature. No calvarium fracture identified. No scalp hematoma. Orbits soft tissues appear stable.  Chronic Calcified atherosclerosis at the skull base. Cerebral volume is stable and normal for age. No ventriculomegaly. No midline shift, mass effect, or evidence of intracranial mass lesion. No acute intracranial hemorrhage identified. No evidence of cortically based acute infarction identified. Normal for age gray-white matter differentiation.  CT CERVICAL SPINE FINDINGS  Multilevel chronic cervical spondylolisthesis. Pronounced chronic anterolisthesis at C3-C4 appears stable since 2013. Mild anterolisthesis at C2-C3 and C7-T1 also appear stable. Associated chronic cervical facet arthropathy. Bilateral cervical posterior element alignment appears stable. Superimposed severe chronic lower cervical disc and endplate with chronic endplate osteophytosis. Degeneration occipital condyles to C1 alignment appears stable. Chronic C1-C2 degeneration may be mildly progressed since 2013. Stable C1-C2 alignment degenerative  odontoid tip subchondral cysts do appear increased. No acute cervical spine fracture identified.  However, there is a small volume of abnormal prevertebral soft tissue fluid which is new. This is primarily about the C4 level (series 5, image 48).  Chronic calcified carotid atherosclerosis in the neck. Stable other noncontrast paraspinal soft tissues. Grossly intact visualized upper thoracic levels.  IMPRESSION: 1. New abnormal prevertebral fluid at the C4 level, suspicious for acute cervical anterior ligamentous injury in this setting. Cervical spine MRI without contrast would evaluate further. 2. No acute fracture or listhesis identified in the cervical spine. Chronic advanced cervical spondylolisthesis and degenerative changes. 3. Stable and negative for age noncontrast CT appearance of the brain.  Electronically Signed: By: Genevie Ann M.D. On: 01/14/2015 11:54   Ct Cervical Spine Wo Contrast  01/14/2015   ADDENDUM REPORT: 01/14/2015 12:08  ADDENDUM: Study discussed by telephone with Dr. Reyne Dumas on 01/14/2015 at 1204 hours.   Electronically Signed   By: Lemmie Evens  [place] Crane M.D.   On: 01/14/2015 12:08   01/14/2015   CLINICAL DATA:  81 year old female who fell and was unconscious. Headache and cervical neck pain. Initial encounter.  EXAM: CT HEAD WITHOUT CONTRAST  CT CERVICAL SPINE WITHOUT CONTRAST  TECHNIQUE: Multidetector CT imaging of the head and cervical spine was performed following the standard protocol without intravenous contrast. Multiplanar CT image reconstructions of the cervical spine were also generated.  COMPARISON:  [facility] CT without contrast 718 2016, 01/08/2015. Cervical spine radiographs 09/05/2013. Cervical spine CT 07/10/2011.  FINDINGS: CT HEAD FINDINGS  Paranasal sinuses and mastoids appear stable and clear. Periapical left maxillary molar dental lucency appears inflammatory in nature. No calvarium fracture identified. No scalp hematoma. Orbits soft tissues appear stable.   Chronic Calcified atherosclerosis at the skull base. Cerebral volume is stable and normal for age. No ventriculomegaly. No midline shift, mass effect, or evidence of intracranial mass lesion. No acute intracranial hemorrhage identified. No evidence of cortically based acute infarction identified. Normal for age gray-white matter differentiation.  CT CERVICAL SPINE FINDINGS  Multilevel chronic cervical spondylolisthesis. Pronounced chronic anterolisthesis at C3-C4 appears stable since 2013. Mild anterolisthesis at C2-C3 and C7-T1 also appear stable. Associated chronic cervical facet arthropathy. Bilateral cervical posterior element alignment appears stable. Superimposed severe chronic lower cervical disc and endplate with chronic endplate osteophytosis. Degeneration occipital condyles to C1 alignment appears stable. Chronic C1-C2 degeneration may be mildly progressed since 2013. Stable C1-C2 alignment degenerative odontoid tip subchondral cysts do appear increased. No acute cervical spine fracture identified.  However, there is a small volume of abnormal prevertebral soft tissue fluid which is new. This is primarily about the C4 level (series 5, image 48).  Chronic calcified carotid atherosclerosis in the neck. Stable other noncontrast paraspinal soft tissues. Grossly intact visualized upper thoracic levels.  IMPRESSION: 1. New abnormal prevertebral fluid at the C4 level, suspicious for acute cervical anterior ligamentous injury in this setting. Cervical spine MRI without contrast would evaluate further. 2. No acute fracture or listhesis identified in the cervical spine. Chronic advanced cervical spondylolisthesis and degenerative changes. 3. Stable and negative for age noncontrast CT appearance of the brain.  Electronically Signed: By: Genevie Ann M.D. On: 01/14/2015 11:54   Mr Cervical Spine Wo Contrast  01/14/2015   CLINICAL DATA:  Headaches and neck pain.  Fall 01/12/2015.  EXAM: MRI CERVICAL SPINE WITHOUT  CONTRAST  TECHNIQUE: Multiplanar, multisequence MR imaging of the cervical spine was performed. No intravenous contrast was administered.  COMPARISON:  Cervical spine CT 01/14/2015 and MRI 07/22/2005  FINDINGS: Study is mildly to moderately motion degraded despite repeating some sequences.  There is mild prevertebral STIR hyperintensity at C3-4 compatible with edema as seen on CT earlier today. No frank disruption of the anterior or posterior longitudinal ligaments is identified. No posterior soft tissue edema suggestive of posterior ligamentous complex injury is identified. No vertebral marrow edema is seen to suggest acute osseous injury.  Vertebral alignment is unchanged from the 2007 MRI with grade 1 anterolisthesis of C2 on C3, C3 on C4, and C7 on T1. Vertebral body heights are preserved. Moderate to severe disc space narrowing is present from C4-5 to C6-7. Craniocervical junction is unremarkable. Cervical spinal cord is normal in signal. 11 mm T2 hyperintense right thyroid nodule is noted.  C2-3: Advanced right and mild left facet arthrosis, mild disc uncovering, and infolding of the ligamentum flavum result in moderate spinal stenosis, increased from the prior MRI.  C3-4: Anterolisthesis with disc uncovering and  advanced bilateral facet arthrosis result in mild spinal stenosis, slightly more prominent than on the prior MRI. There is at least moderate bilateral neural foraminal stenosis, increased from prior MRI.  C4-5: Broad-based posterior disc osteophyte complex and uncovertebral spurring result in mild spinal stenosis asymmetric to the right and moderate to severe right and moderate left neural foraminal stenosis, grossly similar to the prior MRI.  C5-6: Broad-based posterior disc osteophyte complex asymmetric to the right and uncovertebral spurring results in mild spinal stenosis in severe bilateral neural foraminal stenosis, similar to the prior MRI.  C6-7: Broad-based posterior disc osteophyte complex  and uncovertebral spurring result in mild spinal stenosis and moderate bilateral neural foraminal stenosis. The neural foraminal stenosis may have mildly progressed from the prior MRI.  C7-T1: Slight anterolisthesis and disc uncovering without significant stenosis, unchanged.  IMPRESSION: 1. Motion degraded examination. Mild prevertebral edema in the upper cervical spine without frank ligamentous disruption identified. 2. Multilevel cervical disc and facet degeneration as above, mildly progressed from the prior MRI.   Electronically Signed   By: Logan Bores   On: 01/14/2015 20:48   Pelvis Portable  01/15/2015   CLINICAL DATA:  81 year old female with post of for left hip fracture.  EXAM: PORTABLE PELVIS 1-2 VIEWS  COMPARISON:  Radiograph dated 12/1914  FINDINGS: There has been interval placement of an intra medullary rod and screw traversing femoral intertrochanteric fracture. The bones are osteopenic. There are osteoarthritic changes of the hip joints. No acute fracture identified.  IMPRESSION: Left femoral intertrochanteric fracture status post placement of orthopedic hardware.   Electronically Signed   By: Anner Crete M.D.   On: 01/15/2015 18:01   Dg Chest Port 1 View  01/17/2015   CLINICAL DATA:  Shortness of Breath  EXAM: PORTABLE CHEST - 1 VIEW  COMPARISON:  01/15/2015  FINDINGS: Cardiac shadow is again enlarged. Mild vascular congestion is seen. No focal confluent infiltrate is noted. No acute bony abnormality is seen.  IMPRESSION: Mild CHF.   Electronically Signed   By: Inez Catalina M.D.   On: 01/17/2015 08:45   Dg Chest Port 1 View  01/15/2015   CLINICAL DATA:  Decreased oxygen saturation following surgery.  EXAM: PORTABLE CHEST - 1 VIEW  COMPARISON:  01/13/2015; 01/12/2015; 12/27/2013  FINDINGS: Grossly unchanged enlarged cardiac silhouette and mediastinal contours with atherosclerotic plaque within the thoracic aorta. Lung volumes are reduced with worsening bibasilar  heterogeneous/consolidative opacities, left greater than right. Pulmonary vasculature appears less distinct than present examination. Trace bilateral effusions are not excluded. No pneumothorax. Unchanged bones.  IMPRESSION: Suspected mild worsening pulmonary edema and bibasilar atelectasis on this hypoventilated AP portable examination. Further evaluation with a PA and lateral chest radiograph may be obtained as clinically indicated.   Electronically Signed   By: Sandi Mariscal M.D.   On: 01/15/2015 18:32   Chest Portable 1 View  01/13/2015   CLINICAL DATA:  Preop for left femur fracture  EXAM: PORTABLE CHEST - 1 VIEW  COMPARISON:  01/12/2015  FINDINGS: Moderate to severe cardiac enlargement. Calcified aortic arch. Mild vascular congestion. No evidence of consolidation or edema. Minimal right lower lobe scarring or atelectasis.  IMPRESSION: No acute findings. Stable cardiac enlargement with vascular congestion but no pulmonary edema.   Electronically Signed   By: Skipper Cliche M.D.   On: 01/13/2015 21:50   Dg Hip Operative Unilat With Pelvis Left  01/15/2015   CLINICAL DATA:  Patient with recent diagnosis mildly displaced left femoral intertrochanteric fracture  EXAM: OPERATIVE LEFT  HIP (WITH PELVIS IF PERFORMED) 4 VIEWS  TECHNIQUE: Fluoroscopic spot image(s) were submitted for interpretation post-operatively.  COMPARISON:  Hip radiograph 01/13/2015  FINDINGS: Patient status post intra medullary rod and screw fixation of comminuted proximal left femur fracture involving the intertrochanteric region. Hardware appears in appropriate position. Improved anatomic alignment.  IMPRESSION: Patient status post ORIF proximal left femur fracture.   Electronically Signed   By: Lovey Newcomer M.D.   On: 01/15/2015 15:40     CBC  Recent Labs Lab 01/15/15 0400 01/16/15 0302 01/16/15 1710 01/17/15 0130 01/17/15 0445 01/18/15 0435  WBC 7.1 9.0 8.2  --  9.5 10.1  HGB 9.6* 8.8* 7.4* 8.6* 8.8* 8.9*  HCT 31.3* 28.2*  23.5* 27.0* 27.8* 27.9*  PLT 227 218 200  --  211 241  MCV 98.1 97.6 96.7  --  94.9 94.9  MCH 30.1 30.4 30.5  --  30.0 30.3  MCHC 30.7 31.2 31.5  --  31.7 31.9  RDW 13.6 13.6 13.5  --  14.9 14.4    Chemistries   Recent Labs Lab 01/13/15 2119 01/15/15 0400 01/16/15 0302 01/17/15 0445 01/17/15 1115  NA 143 143 143 139 137  K 3.9 3.7 3.4* 3.9 3.8  CL 104 100* 98* 97* 97*  CO2 27 33* 34* 34* 31  GLUCOSE 153* 99 101* 152* 216*  BUN 27* 21* 16 27* 27*  CREATININE 1.32* 1.10* 1.06* 1.44* 1.31*  CALCIUM 8.8* 9.2 8.8* 8.5* 8.5*  AST 24 17 22   --  21  ALT 18 16 16   --  6*  ALKPHOS 71 64 63  --  59  BILITOT 0.9 0.8 0.7  --  0.7   ------------------------------------------------------------------------------------------------------------------ estimated creatinine clearance is 28 mL/min (by C-G formula based on Cr of 1.31). ------------------------------------------------------------------------------------------------------------------ No results for input(s): HGBA1C in the last 72 hours. ------------------------------------------------------------------------------------------------------------------ No results for input(s): CHOL, HDL, LDLCALC, TRIG, CHOLHDL, LDLDIRECT in the last 72 hours. ------------------------------------------------------------------------------------------------------------------ No results for input(s): TSH, T4TOTAL, T3FREE, THYROIDAB in the last 72 hours.  Invalid input(s): FREET3 ------------------------------------------------------------------------------------------------------------------ No results for input(s): VITAMINB12, FOLATE, FERRITIN, TIBC, IRON, RETICCTPCT in the last 72 hours.  Coagulation profile  Recent Labs Lab 01/15/15 0400 01/15/15 1228 01/16/15 0302 01/17/15 0445 01/18/15 0435  INR 3.34* 1.84* 1.24 1.77* 2.77*    No results for input(s): DDIMER in the last 72 hours.  Cardiac Enzymes  Recent Labs Lab 01/16/15 1832  01/17/15 0130 01/17/15 0445  TROPONINI 0.27* 0.29* 0.30*   ------------------------------------------------------------------------------------------------------------------ Invalid input(s): POCBNP   CBG:  Recent Labs Lab 01/17/15 0739 01/17/15 1225 01/17/15 1651 01/17/15 2103 01/18/15 0746  GLUCAP 155* 216* 115* 136* 137*       Studies: Dg Chest Port 1 View  01/17/2015   CLINICAL DATA:  Shortness of Breath  EXAM: PORTABLE CHEST - 1 VIEW  COMPARISON:  01/15/2015  FINDINGS: Cardiac shadow is again enlarged. Mild vascular congestion is seen. No focal confluent infiltrate is noted. No acute bony abnormality is seen.  IMPRESSION: Mild CHF.   Electronically Signed   By: Inez Catalina M.D.   On: 01/17/2015 08:45      Lab Results  Component Value Date   HGBA1C 7.3 11/04/2014   HGBA1C 7..2% 06/12/2014   HGBA1C 7.0* 12/28/2013   Lab Results  Component Value Date   LDLCALC 48 11/04/2014   CREATININE 1.31* 01/17/2015       Scheduled Meds: . amiodarone  100 mg Oral Daily  . aspirin  81 mg Oral Daily  . atorvastatin  40  mg Oral q1800  . budesonide-formoterol  1 puff Inhalation BID  . cholecalciferol  1,000 Units Oral Daily  . enoxaparin (LOVENOX) injection  40 mg Subcutaneous Q24H  . feeding supplement (GLUCERNA SHAKE)  237 mL Oral BID BM  . ferrous sulfate  325 mg Oral TID PC  . fluticasone  1 spray Each Nare BID  . furosemide  20 mg Intravenous Once  . furosemide  20 mg Intravenous Once  . insulin aspart  0-5 Units Subcutaneous QHS  . insulin aspart  0-9 Units Subcutaneous TID WC  . insulin detemir  8 Units Subcutaneous q morning - 10a  . isosorbide mononitrate  60 mg Oral Daily  . LORazepam  0.5 mg Intravenous Once  . NIFEdipine  30 mg Oral Daily  . pantoprazole  40 mg Oral BID  . potassium chloride SA  20 mEq Oral Daily  . senna-docusate  1 tablet Oral BID  . vitamin C  500 mg Oral QHS  . Warfarin - Pharmacist Dosing Inpatient   Does not apply q1800    Continuous Infusions: . sodium chloride 50 mL/hr at 01/17/15 1042  . lactated ringers 10 mL/hr at 01/15/15 1409    Active Problems:   PAF, recurrance this admission, on chronic Amio/ Coumadin   COPD with emphysema   Chronic respiratory failure with hypoxia   HTN (hypertension)   CAD, remote RCA PCI, subsequently noted to be occluded, last cath 8/11   DM type 2 causing CKD stage 3   Hip fracture   Preop cardiovascular exam   Hip fracture requiring operative repair    Time spent: 53 minutes   [facility] Pager [phone]. If 7PM-7AM, please contact night-coverage at [email or web address], password [email or web address] 01/18/2015, 8:51 AM  LOS: 5 days

## 2015-01-18 NOTE — Progress Notes (Signed)
Pt confused all day. Attempt to reorient numerous times throughout the shift. Pt seems to be more agitated when family  is here. Pt kept taking off her NRB mask so placed on 6 L n/c. Sats for most of day anywhere from 91 to 95 %. When pt takes her 02 off desats as low as 59. Placed mittens on pt briefly so she would leave 02 on. Daughter said last admit that they went through the same thing with their mother ( pt). On other admissions & that she didn't sleep for 4 days. BP stable. Given Dulcolax supp today with fair results.

## 2015-01-18 NOTE — Progress Notes (Signed)
ANTICOAGULATION CONSULT NOTE - Follow-Up Consult  Pharmacy Consult for Warfarin Indication: atrial fibrillation  Allergies  Allergen Reactions  . Atorvastatin Other (See Comments)     muscle aches  . Codeine Nausea And Vomiting  . Morphine Nausea And Vomiting  . Other Other (See Comments)    OPIATES cause nausea and vomiting  . Rosuvastatin Other (See Comments)    muscle aches at high doses, held as of 12/2010 due to aches  . Iohexol Other (See Comments)     Desc: unknown reaction; allergic to iodine and contrast     Patient Measurements: Height: 5\' 1"  (154.9 cm) Weight: 147 lb 4.3 oz (66.8 kg) IBW/kg (Calculated) : 47.8   Vital Signs: Temp: 97.7 F (36.5 C) (07/24 1236) Temp Source: Oral (07/24 1236) BP: 160/72 mmHg (07/24 1236) Pulse Rate: 92 (07/24 0405)  Labs:  Recent Labs  01/16/15 0302 01/16/15 1710 01/16/15 1832 01/17/15 0130 01/17/15 0445 01/17/15 1115 01/18/15 0435 01/18/15 0955  HGB 8.8* 7.4*  --  8.6* 8.8*  --  8.9*  --   HCT 28.2* 23.5*  --  27.0* 27.8*  --  27.9*  --   PLT 218 200  --   --  211  --  241  --   LABPROT 15.8*  --   --   --  20.6*  --  28.8*  --   INR 1.24  --   --   --  1.77*  --  2.77*  --   CREATININE 1.06*  --   --   --  1.44* 1.31*  --  0.95  TROPONINI  --   --  0.27* 0.29* 0.30*  --   --   --     Estimated Creatinine Clearance: 38.6 mL/min (by C-G formula based on Cr of 0.95).   Medical History: Past Medical History  Diagnosis Date  . Hyperlipidemia   . Hypertension   . Anxiety   . GERD (gastroesophageal reflux disease)   . CAD (coronary artery disease)     Cath in 2006 showed an occluded RCA with left-to-right collaterals & otherwise noncritical CAD with EF=25-30% at that time. EF subsequently improved to normal by 2D ECHO. Cath Aug. 2011 showed unchanged anatomy. > medical therapy recommended  . Diastolic dysfunction     Grade II  . RBBB (right bundle branch block)     Chronic  . Hypoxemic respiratory failure,  chronic     uses 2.5 liters of oxygen with sleep  . COPD (chronic obstructive pulmonary disease)     GOLD 4 - PFT 06/08/10 FEV1 0.93 (62%), FEV1% 60, TLC 2.84 (69%0, DLCO 54%, +BD) On home O2.  . Secondary pulmonary hypertension   . Edema   . Angina   . Shortness of breath   . Bradycardia 08/24/2011  . Carotid artery disease     Moderate, right greater than left which we following by duplex ultrasound. [place] 12/05/11 = RIght Bulb/Prox ICA: Moderate to severe amt of fibrous plaque elevating velocities w/in prox segment of ICA. Consistent w/a 50-69% diameter reduction. Left Bulb/Prox ICA: Moderate amt of fibrous plaque slightly elevating velocities w/in the prox segment of the ICA. Consistent w/a 0-49% diameter reduction.   Marland Kitchen PAF (paroxysmal atrial fibrillation)     In the past, on coumadin  . History of stress test 07/02/09    Mild perfusion defect seen in the Basal Inferior region(s). This is consistent with an infarct/scar.  Post-stress EF=56%. Global LV systolic function is normal.  EKG  is negative for ischemia.  No significant iscemia detected. Low risk scan.  . Lower extremity edema     ECHO 07/21/12 = EF 55-60%, performed for TIA. Responding to diurectics. Continue diuresis w/ a goal dry weight of <160lb. Venous Duplex 07/27/11 = Right lower extremity: no evidence of thrombus or thrombophlebitis. Essentially normal right lower extremity venous duplex Doppler evaluation.  . Carotid bruit   . Mitral valve regurgitation     Mild to moderate by ECHO 07/21/12  . Sleep-related hypoventilation   . COPD with emphysema   . Coronary atherosclerosis of unspecified type of vessel, native or graft   . PONV (postoperative nausea and vomiting)   . Family history of adverse reaction to anesthesia     "daughter gets PONV"  . CHF (congestive heart failure)     Hospitalized 08/22/11-08/26/11 with CHF secondary to diastolic dysfunction & hospitilized in 07/2012 with a similar problem. TTE 08/23/11 = normal EF.  Marland Kitchen  Myocardial infarction     "she's had several" (01/13/2015)  . On home oxygen therapy     "?L; prn" (01/13/2015)  . Pneumonia ~ 2013  . Type II diabetes mellitus     goal A1C is 8, to avoid hypoglycemia  . Sleep apnea   . Stroke     "/CT scan; ~ 2014; didn't even know she'd had it"  (01/13/2015)  . Arthritis     "all over"  . Chronic lower back pain   . Bleeds easily   . Fall during current hospitalization     "was at [facility]; fell; transferred to [facility]" (01/13/2015)    Medications:  Prescriptions prior to admission  Medication Sig Dispense Refill Last Dose  . warfarin (COUMADIN) 5 MG tablet TAKE (1) TABLET DAILY AFTER SUPPER. (Patient taking differently: TAKE (1) TABLET DAILY AFTER SUPPER EXCEPT ON SATURDAY. NO MEDICATION ON SATURDAY) 30 tablet 2 unknown  . ALPRAZolam (XANAX) 0.25 MG tablet TAKE 1/2 TO 1 TABLET 2 TIMES A DAY AS NEEDED FOR ANXIETY 30 tablet 2 Taking  . amiodarone (PACERONE) 200 MG tablet Take 0.5 tablets (100 mg total) by mouth daily. Please keep upcoming appointment 30 tablet 0 Taking  . aspirin 81 MG chewable tablet Chew 81 mg by mouth daily.   Taking  . atorvastatin (LIPITOR) 40 MG tablet Take 1 tablet (40 mg total) by mouth every evening. 30 tablet 8 Taking  . Azelastine HCl 0.15 % SOLN Place 1 spray into both nostrils 2 (two) times daily as needed (congestion).   Taking  . budesonide-formoterol (SYMBICORT) 160-4.5 MCG/ACT inhaler Inhale 1 puff into the lungs 2 (two) times daily. 3 Inhaler 2 Taking  . Cholecalciferol (VITAMIN D PO) Take 1 capsule by mouth daily.    Taking  . fluticasone (FLONASE) 50 MCG/ACT nasal spray Place 1 spray into both nostrils 2 (two) times daily. 16 g 6 Taking  . furosemide (LASIX) 40 MG tablet Take 1 tablet (40 mg total) by mouth daily. May take extra dose if weight increases 3.5 pounds in 24 hrs as needed for fluid and edema. 60 tablet 4 Taking  . glucosamine-chondroitin 500-400 MG tablet Take 1 tablet by mouth 2 (two) times daily.   Taking   . glucose blood (RELION ULTIMA TEST) test strip USE TO CHECK GLUCOSE UP TO 4 TIMES DAILY 200 each 1 Taking  . insulin aspart (NOVOLOG) 100 UNIT/ML injection Inject 0 to 8 units up to 3 times a day prior to meal per sliding scale 20 mL 2 Taking  . insulin  detemir (LEVEMIR) 100 UNIT/ML injection Inject 0.16 mLs (16 Units total) into the skin every morning. 20 mL 2 Taking  . insulin lispro (HUMALOG KWIKPEN) 100 UNIT/ML KiwkPen Inject 0-8 Units into the skin 3 (three) times daily before meals. Per sliding scale   Not Taking  . Ipratropium-Albuterol (COMBIVENT) 20-100 MCG/ACT AERS respimat Inhale 1 puff into the lungs every 6 (six) hours as needed for wheezing or shortness of breath. 3 Inhaler 2 Taking  . isosorbide mononitrate (IMDUR) 60 MG 24 hr tablet TAKE 1 TABLET DAILY 30 tablet 4 Taking  . metoprolol succinate (TOPROL-XL) 25 MG 24 hr tablet TAKE 1/2 TABLET DAILY 15 tablet 4 Taking  . Multiple Vitamins-Minerals (PRESERVISION/LUTEIN) CAPS Take 1 capsule by mouth daily.    Taking  . NIFEdipine (NIFEDICAL XL) 30 MG 24 hr tablet Take 1 tablet (30 mg total) by mouth daily. PATIENT NEEDS TO CONTACT OFFICE FOR ADDITIONAL REFILLS 30 tablet 0 Taking  . nitroGLYCERIN (NITROSTAT) 0.4 MG SL tablet Place 1 tablet (0.4 mg total) under the tongue every 5 (five) minutes as needed for chest pain. 25 tablet 1 Taking  . pantoprazole (PROTONIX) 40 MG tablet TAKE 1 TABLET TWICE A DAY 60 tablet 4 Taking  . potassium chloride SA (K-DUR,KLOR-CON) 20 MEQ tablet Take 1 tablet (20 mEq total) by mouth daily. 30 tablet 1   . senna-docusate (SENOKOT-S) 8.6-50 MG per tablet Take 1 tablet by mouth 2 (two) times daily.   Taking  . simethicone (MYLICON) 80 MG chewable tablet Chew 80 mg by mouth at bedtime as needed for flatulence.   Taking  . vitamin C (ASCORBIC ACID) 500 MG tablet Take 500 mg by mouth at bedtime.    Taking    Assessment: 81yo female transferred from [facility] after hip fracture.  Pt on Coumadin PTA for Afib. Pt  received 12.5mg  total of PO Vit K on 7/20, 3mg  IV Vit K on 7/21, and 2 units of FFP on 7/21 to reverse INR prior to hip surgery on afternoon of 7/21. Coumadin resumed post surgery.  Expected higher doses of Coumadin would be needed d/t Vit K and FFP but INR has responded well after resuming Coumadin.    PTA Coumadin dose: 5mg  daily except NO coumadin on Saturdays (per pt and family)  INR 1.77>>2.77, H/H 8.9/27.9, Plts wnl, no reported bleeding  Goal of Therapy:  INR 2-3 Monitor platelets by anticoagulation protocol: Yes   Plan:  Lovenox stopped  Coumadin 1mg  x1 tonight Monitor daily INR, CBC, s/sx of bleeding   Albertina Parr, PharmD., BCPS Clinical Pharmacist Pager [phone]

## 2015-01-18 NOTE — Progress Notes (Signed)
Pt's daughter here calming pt & helping her keep her oxygen NRB on

## 2015-01-19 ENCOUNTER — Ambulatory Visit: Payer: Medicare Other | Admitting: Physician Assistant

## 2015-01-19 LAB — GLUCOSE, CAPILLARY
GLUCOSE-CAPILLARY: 119 mg/dL — AB (ref 65–99)
GLUCOSE-CAPILLARY: 101 mg/dL — AB (ref 65–99)
Glucose-Capillary: 125 mg/dL — ABNORMAL HIGH (ref 65–99)
Glucose-Capillary: 196 mg/dL — ABNORMAL HIGH (ref 65–99)

## 2015-01-19 LAB — CBC
HEMATOCRIT: 28.5 % — AB (ref 36.0–46.0)
HEMOGLOBIN: 9.1 g/dL — AB (ref 12.0–15.0)
MCH: 31 pg (ref 26.0–34.0)
MCHC: 31.9 g/dL (ref 30.0–36.0)
MCV: 96.9 fL (ref 78.0–100.0)
PLATELETS: 239 10*3/uL (ref 150–400)
RBC: 2.94 MIL/uL — ABNORMAL LOW (ref 3.87–5.11)
RDW: 14.2 % (ref 11.5–15.5)
WBC: 10 10*3/uL (ref 4.0–10.5)

## 2015-01-19 LAB — BASIC METABOLIC PANEL
Anion gap: 8 (ref 5–15)
BUN: 16 mg/dL (ref 6–20)
CO2: 35 mmol/L — AB (ref 22–32)
Calcium: 8.6 mg/dL — ABNORMAL LOW (ref 8.9–10.3)
Chloride: 98 mmol/L — ABNORMAL LOW (ref 101–111)
Creatinine, Ser: 0.86 mg/dL (ref 0.44–1.00)
Glucose, Bld: 134 mg/dL — ABNORMAL HIGH (ref 65–99)
Potassium: 3.3 mmol/L — ABNORMAL LOW (ref 3.5–5.1)
Sodium: 141 mmol/L (ref 135–145)

## 2015-01-19 LAB — PROTIME-INR
INR: 3.28 — ABNORMAL HIGH (ref 0.00–1.49)
PROTHROMBIN TIME: 32.7 s — AB (ref 11.6–15.2)

## 2015-01-19 MED ORDER — FUROSEMIDE 10 MG/ML IJ SOLN
20.0000 mg | Freq: Two times a day (BID) | INTRAMUSCULAR | Status: DC
Start: 2015-01-19 — End: 2015-01-21
  Administered 2015-01-19 – 2015-01-21 (×5): 20 mg via INTRAVENOUS
  Filled 2015-01-19 (×7): qty 2

## 2015-01-19 MED ORDER — METOPROLOL TARTRATE 12.5 MG HALF TABLET
12.5000 mg | ORAL_TABLET | Freq: Two times a day (BID) | ORAL | Status: DC
  Administered 2015-01-19 – 2015-01-23 (×7): 12.5 mg via ORAL
  Filled 2015-01-19 (×9): qty 1

## 2015-01-19 MED ORDER — POTASSIUM CHLORIDE CRYS ER 20 MEQ PO TBCR
40.0000 meq | EXTENDED_RELEASE_TABLET | Freq: Every day | ORAL | Status: DC
  Administered 2015-01-19 – 2015-01-23 (×5): 40 meq via ORAL
  Filled 2015-01-19 (×5): qty 2

## 2015-01-19 NOTE — Discharge Instructions (Signed)
Partial weight bearing  (25%) left leg  Supervised walking using a walker only for short distance  Wheel chair ambulation for more extended ambulation  Ice to the left hip.  Follow up with Dr Veverly Fells in two weeks in the office  [phone]  Information on my medicine - Coumadin   (Warfarin)  This medication education was reviewed with me or my healthcare representative as part of my discharge preparation.  The pharmacist that spoke with me during my hospital stay was:  Kem Parkinson, [place]  Why was Coumadin prescribed for you? Coumadin was prescribed for you because you have a blood clot or a medical condition that can cause an increased risk of forming blood clots. Blood clots can cause serious health problems by blocking the flow of blood to the heart, lung, or brain. Coumadin can prevent harmful blood clots from forming. As a reminder your indication for Coumadin is:   Stroke Prevention Because Of Atrial Fibrillation  What test will check on my response to Coumadin? While on Coumadin (warfarin) you will need to have an INR test regularly to ensure that your dose is keeping you in the desired range. The INR (international normalized ratio) number is calculated from the result of the laboratory test called prothrombin time (PT).  If an INR APPOINTMENT HAS NOT ALREADY BEEN MADE FOR YOU please schedule an appointment to have this lab work done by your health care provider within 7 days. Your INR goal is usually a number between:  2 to 3 or your provider may give you a more narrow range like 2-2.5.  Ask your health care provider during an office visit what your goal INR is.  What  do you need to  know  About  COUMADIN? Take Coumadin (warfarin) exactly as prescribed by your healthcare provider about the same time each day.  DO NOT stop taking without talking to the doctor who prescribed the medication.  Stopping without other blood clot prevention medication to take the place of Coumadin may  increase your risk of developing a new clot or stroke.  Get refills before you run out.  What do you do if you miss a dose? If you miss a dose, take it as soon as you remember on the same day then continue your regularly scheduled regimen the next day.  Do not take two doses of Coumadin at the same time.  Important Safety Information A possible side effect of Coumadin (Warfarin) is an increased risk of bleeding. You should call your healthcare provider right away if you experience any of the following: ? Bleeding from an injury or your nose that does not stop. ? Unusual colored urine (red or dark brown) or unusual colored stools (red or black). ? Unusual bruising for unknown reasons. ? A serious fall or if you hit your head (even if there is no bleeding).  Some foods or medicines interact with Coumadin (warfarin) and might alter your response to warfarin. To help avoid this: ? Eat a balanced diet, maintaining a consistent amount of Vitamin K. ? Notify your provider about major diet changes you plan to make. ? Avoid alcohol or limit your intake to 1 drink for women and 2 drinks for men per day. (1 drink is 5 oz. wine, 12 oz. beer, or 1.5 oz. liquor.)  Make sure that ANY health care provider who prescribes medication for you knows that you are taking Coumadin (warfarin).  Also make sure the healthcare provider who is monitoring your  Coumadin knows when you have started a new medication including herbals and non-prescription products.  Coumadin (Warfarin)  Major Drug Interactions  Increased Warfarin Effect Decreased Warfarin Effect  Alcohol (large quantities) Antibiotics (esp. Septra/Bactrim, Flagyl, Cipro) Amiodarone (Cordarone) Aspirin (ASA) Cimetidine (Tagamet) Megestrol (Megace) NSAIDs (ibuprofen, naproxen, etc.) Piroxicam (Feldene) Propafenone (Rythmol SR) Propranolol (Inderal) Isoniazid (INH) Posaconazole (Noxafil) Barbiturates (Phenobarbital) Carbamazepine  (Tegretol) Chlordiazepoxide (Librium) Cholestyramine (Questran) Griseofulvin Oral Contraceptives Rifampin Sucralfate (Carafate) Vitamin K   Coumadin (Warfarin) Major Herbal Interactions  Increased Warfarin Effect Decreased Warfarin Effect  Garlic Ginseng Ginkgo biloba Coenzyme Q10 Green tea St. Johns wort    Coumadin (Warfarin) FOOD Interactions  Eat a consistent number of servings per week of foods HIGH in Vitamin K (1 serving =  cup)  Collards (cooked, or boiled & drained) Kale (cooked, or boiled & drained) Mustard greens (cooked, or boiled & drained) Parsley *serving size only =  cup Spinach (cooked, or boiled & drained) Swiss chard (cooked, or boiled & drained) Turnip greens (cooked, or boiled & drained)  Eat a consistent number of servings per week of foods MEDIUM-HIGH in Vitamin K (1 serving = 1 cup)  Asparagus (cooked, or boiled & drained) Broccoli (cooked, boiled & drained, or raw & chopped) Brussel sprouts (cooked, or boiled & drained) *serving size only =  cup Lettuce, raw (green leaf, endive, romaine) Spinach, raw Turnip greens, raw & chopped   These websites have more information on Coumadin (warfarin):  FailFactory.se; VeganReport.com.au;

## 2015-01-19 NOTE — Progress Notes (Signed)
Physical Therapy Treatment Patient Details Name: [name] MRN: [id number] DOB: 08/30/1927 Today's Date: 01/19/2015    History of Present Illness Pt admitted to [facility]7/14 with UTI, fall 7/18 with Left intertrochanteric femur fx, transfer to [facility] with IM nail. PMHx: CAD, CHF, AFib, legally blind    PT Comments    Pt with improved cognition and mobility from evaluation with ability to assist with transfers today but continues to require 2 person assist for mobility with great difficulty trying to adhere to PWB 25% with attempts at standing. Will continue to follow and educated for HEP.   Follow Up Recommendations  SNF;Supervision/Assistance - 24 hour     Equipment Recommendations       Recommendations for Other Services       Precautions / Restrictions Precautions Precautions: Fall Restrictions Weight Bearing Restrictions: Yes LLE Weight Bearing: Partial weight bearing LLE Partial Weight Bearing Percentage or Pounds: 25%    Mobility  Bed Mobility Overal bed mobility: Needs Assistance Bed Mobility: Supine to Sit     Supine to sit: Mod assist;+2 for physical assistance     General bed mobility comments: cues for seqeunce with assist to bring bil LE to EOb, pt able to bend Right knee and scoot hips toward EOB with min assist, use of rail to elevate trunk from surface. min assist to fully scoot to EOB  Transfers Overall transfer level: Needs assistance   Transfers: Sit to/from [employer] Transfers Sit to Stand: Mod assist;+2 physical assistance Stand pivot transfers: Max assist;+2 physical assistance       General transfer comment: 2 person assist to stand from bed for pivot to recliner toward right with P.T foot under pt LLE to limit weightbearing. Pt with difficulty offloading weight onto bil UE with use of pad and belt to control and direct pelvis to chair. Pt needing to void in chair and stood x 3 with 2 person assist for use of bedpan with 3rd person for  pericare. Recommend lateral scoot pivot or lift back to bed with nursing  Ambulation/Gait                 Stairs            Wheelchair Mobility    Modified Rankin (Stroke Patients Only)       Balance Overall balance assessment: Needs assistance   Sitting balance-Leahy Scale: Fair       Standing balance-Leahy Scale: Poor                      Cognition Arousal/Alertness: Awake/alert Behavior During Therapy: WFL for tasks assessed/performed Overall Cognitive Status: Impaired/Different from baseline Area of Impairment: Memory;Safety/judgement;Problem solving   Current Attention Level: Selective Memory: Decreased short-term memory Following Commands: Follows one step commands consistently Safety/Judgement: Decreased awareness of deficits;Decreased awareness of safety   Problem Solving: Slow processing;Difficulty sequencing;Requires verbal cues;Requires tactile cues General Comments: pt pleasant, oriented today but at times having difficulty recalling relationship to grandgtr in room    Exercises General Exercises - Lower Extremity Long Arc Quad: AROM;AAROM;Right;Left;15 reps;Seated (AAROM on LLE) Hip Flexion/Marching: AROM;PROM;Seated;Right;Left;15 reps (PROM on LLE)    General Comments        Pertinent Vitals/Pain Pain Assessment: No/denies pain Pain Score: 3  Pain Location: left hip with transfers Pain Descriptors / Indicators: Aching Pain Intervention(s): Repositioned  sats 88-91% on 6L HR 80    Home Living  Prior Function            PT Goals (current goals can now be found in the care plan section) Progress towards PT goals: Progressing toward goals    Frequency       PT Plan Current plan remains appropriate    Co-evaluation             End of Session Equipment Utilized During Treatment: Gait belt;Oxygen Activity Tolerance: Patient tolerated treatment well Patient left: in chair;with call  bell/phone within reach;with chair alarm set;with nursing/sitter in room     Time: 1021-1053 PT Time Calculation (min) (ACUTE ONLY): 32 min  Charges:  $Therapeutic Exercise: 8-22 mins $Therapeutic Activity: 8-22 mins                    G Codes:      [name] January 23, 2015, 11:49 AM [name], [place]

## 2015-01-19 NOTE — Progress Notes (Signed)
Received call back from Ginger and gave her report on [place]. Will transfer to 5W34 by bed.

## 2015-01-19 NOTE — Progress Notes (Signed)
Faxed updated clinicals to [facility] still able to accept pt when medically stable  CSW will continue to follow.  Domenica Reamer, [place] Social Worker [phone]

## 2015-01-19 NOTE — Progress Notes (Addendum)
[name] [id number] Admission Data: 01/19/2015 4:54 PM Attending Provider: Reyne Dumas, MD  XNA:TFTDD, Elenore Rota, MD Consults/ Treatment Team: Treatment Team:  Netta Cedars, MD  [name] is a 81 y.o. female patient admitted from ED awake, alert  & orientated  X 3,  Full Code, VSS - Blood pressure 116/52, pulse 71, temperature 98.1 F (36.7 C), temperature source Oral, resp. rate 21, height 5\' 1"  (1.549 m), weight 66.8 kg (147 lb 4.3 oz), SpO2 91 %., O2    4 L nasal cannular, no c/o shortness of breath, no c/o chest pain, no distress noted. Tele 21 placed running NSR.    IV site WDL:  upper arm right, condition patent and no redness with a transparent dsg that's clean dry and intact.  Allergies:   Allergies  Allergen Reactions  . Atorvastatin Other (See Comments)     muscle aches  . Codeine Nausea And Vomiting  . Morphine Nausea And Vomiting  . Other Other (See Comments)    OPIATES cause nausea and vomiting  . Rosuvastatin Other (See Comments)    muscle aches at high doses, held as of 12/2010 due to aches  . Iohexol Other (See Comments)     Desc: unknown reaction; allergic to iodine and contrast      Past Medical History  Diagnosis Date  . Hyperlipidemia   . Hypertension   . Anxiety   . GERD (gastroesophageal reflux disease)   . CAD (coronary artery disease)     Cath in 2006 showed an occluded RCA with left-to-right collaterals & otherwise noncritical CAD with EF=25-30% at that time. EF subsequently improved to normal by 2D ECHO. Cath Aug. 2011 showed unchanged anatomy. > medical therapy recommended  . Diastolic dysfunction     Grade II  . RBBB (right bundle branch block)     Chronic  . Hypoxemic respiratory failure, chronic     uses 2.5 liters of oxygen with sleep  . COPD (chronic obstructive pulmonary disease)     GOLD 4 - PFT 06/08/10 FEV1 0.93 (62%), FEV1% 60, TLC 2.84 (69%0, DLCO 54%, +BD) On home O2.  . Secondary pulmonary hypertension   . Edema   . Angina    . Shortness of breath   . Bradycardia 08/24/2011  . Carotid artery disease     Moderate, right greater than left which we following by duplex ultrasound. [place] 12/05/11 = RIght Bulb/Prox ICA: Moderate to severe amt of fibrous plaque elevating velocities w/in prox segment of ICA. Consistent w/a 50-69% diameter reduction. Left Bulb/Prox ICA: Moderate amt of fibrous plaque slightly elevating velocities w/in the prox segment of the ICA. Consistent w/a 0-49% diameter reduction.   Marland Kitchen PAF (paroxysmal atrial fibrillation)     In the past, on coumadin  . History of stress test 07/02/09    Mild perfusion defect seen in the Basal Inferior region(s). This is consistent with an infarct/scar.  Post-stress EF=56%. Global LV systolic function is normal.  EKG is negative for ischemia.  No significant iscemia detected. Low risk scan.  . Lower extremity edema     ECHO 07/21/12 = EF 55-60%, performed for TIA. Responding to diurectics. Continue diuresis w/ a goal dry weight of <160lb. Venous Duplex 07/27/11 = Right lower extremity: no evidence of thrombus or thrombophlebitis. Essentially normal right lower extremity venous duplex Doppler evaluation.  . Carotid bruit   . Mitral valve regurgitation     Mild to moderate by ECHO 07/21/12  . Sleep-related hypoventilation   . COPD with emphysema   .  Coronary atherosclerosis of unspecified type of vessel, native or graft   . PONV (postoperative nausea and vomiting)   . Family history of adverse reaction to anesthesia     "daughter gets PONV"  . CHF (congestive heart failure)     Hospitalized 08/22/11-08/26/11 with CHF secondary to diastolic dysfunction & hospitilized in 07/2012 with a similar problem. TTE 08/23/11 = normal EF.  Marland Kitchen Myocardial infarction     "she's had several" (01/13/2015)  . On home oxygen therapy     "?L; prn" (01/13/2015)  . Pneumonia ~ 2013  . Type II diabetes mellitus     goal A1C is 8, to avoid hypoglycemia  . Sleep apnea   . Stroke     "/CT scan; ~ 2014;  didn't even know she'd had it"  (01/13/2015)  . Arthritis     "all over"  . Chronic lower back pain   . Bleeds easily   . Fall during current hospitalization     "was at [facility]; fell; transferred to [facility]" (01/13/2015)    History:  obtained from the patient.  Pt orientation to unit, room and routine. Information packet given to patient/family and safety video watched.  Admission INP armband ID verified with patient/family, and in place. SR up x 2, fall risk assessment complete with Patient and family verbalizing understanding of risks associated with falls. Pt verbalizes an understanding of how to use the call bell and to call for help before getting out of bed.  Skin, clean-dry- intact without evidence of bruising, or skin tears.   No evidence of skin break down noted on exam. Incision on L hip. no rashes, no ecchymoses    Will cont to monitor and assist as needed.  Carrol Hougland Margaretha Sheffield, RN 01/19/2015 4:54 PM

## 2015-01-19 NOTE — Care Management Important Message (Signed)
Important Message  Patient Details  Name: [name] MRN: [id number] Date of Birth: 09/03/1927   Medicare Important Message Given:  Yes-third notification given    Pricilla Handler 01/19/2015, 2:10 PM

## 2015-01-19 NOTE — Progress Notes (Signed)
Second call placed to 5W to give report. Informed that Ginger was not notified to call me back. She asked if she could check on her patients and then call me back.

## 2015-01-19 NOTE — Progress Notes (Signed)
Placed a call to unit [facility] W to give report on our patient in 2C01. Was told that her nurse was on lunch and that she would call back in 15 minutes.

## 2015-01-19 NOTE — Progress Notes (Signed)
   Subjective: 4 Days Post-Op Procedure(s) (LRB): INTRAMEDULLARY (IM) NAIL FEMORAL LEFT  (Left)  Pt alert but disoriented Denies any new symptoms or issues Mild pain to left hip Patient reports pain as mild.  Objective:   VITALS:   Filed Vitals:   01/19/15 0600  BP: 130/47  Pulse: 81  Temp:   Resp: 19    Left hip incisions healing well Minimal drainage from distal incision Dressings in place by nursing nv intact distally  LABS  Recent Labs  01/17/15 0445 01/18/15 0435 01/19/15 0325  HGB 8.8* 8.9* 9.1*  HCT 27.8* 27.9* 28.5*  WBC 9.5 10.1 10.0  PLT 211 241 239     Recent Labs  01/17/15 1115 01/18/15 0955 01/19/15 0325  NA 137 141 141  K 3.8 3.8 3.3*  BUN 27* 20 16  CREATININE 1.31* 0.95 0.86  GLUCOSE 216* 146* 134*     Assessment/Plan: 4 Days Post-Op Procedure(s) (LRB): INTRAMEDULLARY (IM) NAIL FEMORAL LEFT  (Left) PT/OT as able non weight bearing left lower extremity D/c planning to snf when medically stable Pain control as needed Plan for f/u in 2 weeks once discharged   [place], [place], PA-C  01/19/2015, 8:27 AM

## 2015-01-19 NOTE — Progress Notes (Signed)
Received report from [place] on [facility]

## 2015-01-19 NOTE — Progress Notes (Signed)
Speech Language Pathology Treatment: Dysphagia  Patient Details Name: [name] MRN: [id number] DOB: Oct 15, 1927 Today's Date: 01/19/2015 Time: 7588-3254 SLP Time Calculation (min) (ACUTE ONLY): 20 min  Assessment / Plan / Recommendation Clinical Impression  Pt awake, moderately confused and cooperative. Penetration/aspiration suspected indicated by consistent and strong cough and delayed throat clear following thin via straw mitigated by cup sips. She required mod-max verbal and tactile cues to control and decrease sip size. Fair mastication and transit with Dys 2 texture however, given continued confusion and periods of lethargy, recommend continuing Dys 2 texture and thin, no straws, pills whole in applesauce and full supervision.   HPI Other Pertinent Information: 81 yo female with PMH: GERD, anxiety, COPD, SOB, CHF, bradycardia, HTN admitted following fall sustaining hip fx at [facility] where she was admitted due to headache and severe confusion Family requested transfer to [facility]. Underwent IM left hip 7/21. MRI of spine revealed Mild prevertebral edema in the upper   Pertinent Vitals Pain Assessment: No/denies pain  SLP Plan  Continue with current plan of care    Recommendations Diet recommendations: Dysphagia 2 (fine chop);Thin liquid Liquids provided via: Cup;No straw Medication Administration: Whole meds with puree Supervision: Patient able to self feed;Full supervision/cueing for compensatory strategies Compensations: Small sips/bites;Slow rate;Check for pocketing Postural Changes and/or Swallow Maneuvers: Seated upright 90 degrees;Upright 30-60 min after meal              Oral Care Recommendations: Oral care BID Follow up Recommendations:  (TBD) Plan: Continue with current plan of care    GO     [name] 01/19/2015, 10:42 AM   [name].Ed [employer] [phone]

## 2015-01-19 NOTE — Progress Notes (Signed)
[name]  [name] WLS:[id number] DOB: [birth year] DOA: 01/13/2015 PCP: Redge Gainer, MD  Assessment/Plan: Active Problems:   PAF, recurrance this admission, on chronic Amio/ Coumadin   COPD with emphysema   Chronic respiratory failure with hypoxia   HTN (hypertension)   CAD, remote RCA PCI, subsequently noted to be occluded, last cath 8/11   DM type 2 causing CKD stage 3   Hip fracture   Preop cardiovascular exam   Hip fracture requiring operative repair     Mechanical fall,Left intertrochanteric femur fracture/neck pain Patient became hypoxic requiring 100% nonrebreather in the PACU post surgery Transferred to step down and BiPAP ordered however the patient refused BiPAP and was combative and agitated  physical therapy recommends SNF Anticipate discharge to SNF in the next 1-2 days Patient received FFP and vitamin K prior to surgery Some improvement in headache and neck pain,  CT head and neck negative for intracranial bleed and fracture MRI of the cervical spine does not show any ligamentous disruption, shows facet arthropathy Dilaudid and baclofen for headache and neck pain,     Abnormal troponin Likely demand ischemia from anemia and hypotension  cardiology feels that the patient is stable from a cardiac standpoint and has signed off Troponin elevation is flat 2-D echo still pending Continue aspirin  Acute Hypoxemic hypercapnic respiratory failure, waxing and waning Combination of GOLD stage IV COPD and pulmonary edema/acute on chronic diastolic CHF  Most recent echo 12/2013 showed LV EF of 55-60% Resumed 20 mg Lasix IV twice a day and continue slow diuresis Patient also has atelectasis, currently requiring 5 to 6 L of oxygen, nonrebreather last night Start the patient on incentive spirometry and mobilize as tolerated Tried to wean the patient's oxygen down to 3-4 L today  Hypokalemia Replete   History of coronary artery disease,  chronic diastolic congestive heart failure without exacerbation, paroxysmal atrial fibrillation, peripheral vascular disease Cardiology consulted for clearance prior to surgery EKG shows first-degree AV block, right bundle branch block Patient will be at increased risk for surgery, however no active chest pain, Cardiology recommended to proceed with surgery without further cardiac evaluation Continue amiodarone for atrial fibrillation Mild increase in the patient's troponin likely secondary to demand ischemia in the setting of anemia and hypotension Repeat 2-D echo, Hold  imdur  and nifedipine as blood pressure is soft, and diuretics have been resumed   Acute blood loss anemia  during surgery- hg dropped from 11.2 to 7.4 today , on POD#1 Status post transfusion of one unit of packed red blood cells 7/22, hemoglobin now improving 9.1 today Follow-up CBC stable   Supratherapeutic INR, now subtherapeutic Patient received a total of 15 mg of vitamin K since her admission to reverse coagulopathy for surgery and received FFP INR  supratherapeutic this morning, hold  Coumadin,      Peripheral vascular disease, on aspirin at home  Diabetes continue NovoLog and Levemir, CBG stable  Hypertension holding nifedipine ,  IMDUR , Toprol-XL, as pt blood pressure has been borderline low after surgery ,  patient required fluid boluses and packed red blood cells to stabilize her blood pressure.  Blood pressure is better this morning, hypotension seems to have resolved Restart metoprolol ,  Acute delirium: multifactorial due to all of the above , better this am   Oxygen dependent COPD, continue Symbicort, when necessary nebulizer treatments, o2 requirements improving    Code Status:      Code Status Orders  Start     Ordered   01/13/15 2020  Full code   Continuous     01/13/15 2019     Family Communication: family updated about patient's clinical progress Disposition Plan:     Transfer  to telemetry    Brief narrative: [name] is a 81 y.o. female with a history of CAD, diastolic CHF, chronic RBBB, Paroxysmal AF (on coumadin and low dose amiodarone), PVD (moderate carotid and LSCA disease), DM2, HTN, HL,COPD on home O2, anxiety, former smoker and DM who was admitted to [facility] on 7/14 for headaches and neck pain. W/U showed EColi UTI and pt received IV Rocephin. Head CT showed old lacunar infarct. She became confused/ delirium in the hospital. She had a fell 7/18 and broke her L hip (intertrochanteric fx). She was transferred to [facility] for ortho eval and surgery. She was ambulatory with a walker and lived independently at home before her recent hospitalization. She denies any chest pain or palpitation.   Heart cath in 2006 showed an occluded RCA with left-to-right collaterals & otherwise noncritical CAD with EF=25-30% at that time. Her last cath was performed by Dr. Ellouise Newer 01/25/2010 revealed a chronically totally occluded dominant RCA, 50% proximal circumflex with left to right collaterals and normal LV function > medical therapy recommended.  Most recent echo 12/2013 showed LV EF of 55-60%, mild LVH, interobasal akinesis, grade 2 DD, moderate LAE and mild MR. She was doing well on cardiac stand point of view when last seen by Dr. Gwenlyn Found 11/2013.  Consultants:  Cardiology  Orthopedics    Procedures:  None  Antibiotics: Anti-infectives    Start     Dose/Rate Route Frequency Ordered Stop   01/15/15 2100  ceFAZolin (ANCEF) IVPB 2 g/50 mL premix     2 g 100 mL/hr over 30 Minutes Intravenous Every 6 hours 01/15/15 2027 01/16/15 0310   01/15/15 1430  ceFAZolin (ANCEF) IVPB 2 g/50 mL premix     2 g 100 mL/hr over 30 Minutes Intravenous To [facility] 01/14/15 1259 01/15/15 1450         HPI/Subjective: Patient agitated and confused last night, somewhat alert and oriented this morning, received 1 dose of IV Ativan because of agitation  last night, grand daughter by the bedside   Objective: Filed Vitals:   01/19/15 0200 01/19/15 0413 01/19/15 0600 01/19/15 0700  BP: 127/62 138/48 130/47 164/72  Pulse: 81 81 81 104  Temp:  99.3 F (37.4 C)  98.1 F (36.7 C)  TempSrc:  Oral  Oral  Resp: 22 20 19 21   Height:      Weight:      SpO2: 98% 97% 97% 93%    Intake/Output Summary (Last 24 hours) at 01/19/15 0855 Last data filed at 01/19/15 0700  Gross per 24 hour  Intake   1425 ml  Output   3050 ml  Net  -1625 ml    Exam:  General: Uncomfortable, confused , disoriented  Lungs: Clear to auscultation bilaterally without wheezes or crackles Cardiovascular: Regular rate and rhythm without murmur gallop or rub normal S1 and S2 Abdomen: Nontender, nondistended, soft, bowel sounds positive, no rebound, no ascites, no appreciable mass Extremities: No significant cyanosis, clubbing, or edema bilateral lower extremities     Data Review   Micro Results Recent Results (from the past 240 hour(s))  Surgical pcr screen     Status: None   Collection Time: 01/15/15  6:10 AM  Result Value Ref Range Status  MRSA, PCR NEGATIVE NEGATIVE Final   Staphylococcus aureus NEGATIVE NEGATIVE Final    Comment:        The Xpert SA Assay (FDA approved for NASAL specimens in patients over 64 years of age), is one component of a comprehensive surveillance program.  Test performance has been validated by [facility] for patients greater than or equal to 32 year old. It is not intended to diagnose infection nor to guide or monitor treatment.   MRSA PCR Screening     Status: None   Collection Time: 01/15/15  8:56 PM  Result Value Ref Range Status   MRSA by PCR NEGATIVE NEGATIVE Final    Comment:        The GeneXpert MRSA Assay (FDA approved for NASAL specimens only), is one component of a comprehensive MRSA colonization surveillance program. It is not intended to diagnose MRSA infection nor to guide or monitor treatment  for MRSA infections.     Radiology Reports Ct Head Wo Contrast  01/14/2015   ADDENDUM REPORT: 01/14/2015 12:08  ADDENDUM: Study discussed by telephone with Dr. Reyne Dumas on 01/14/2015 at 1204 hours.   Electronically Signed   By: Genevie Ann M.D.   On: 01/14/2015 12:08   01/14/2015   CLINICAL DATA:  81 year old female who fell and was unconscious. Headache and cervical neck pain. Initial encounter.  EXAM: CT HEAD WITHOUT CONTRAST  CT CERVICAL SPINE WITHOUT CONTRAST  TECHNIQUE: Multidetector CT imaging of the head and cervical spine was performed following the standard protocol without intravenous contrast. Multiplanar CT image reconstructions of the cervical spine were also generated.  COMPARISON:  [facility] CT without contrast 718 2016, 01/08/2015. Cervical spine radiographs 09/05/2013. Cervical spine CT 07/10/2011.  FINDINGS: CT HEAD FINDINGS  Paranasal sinuses and mastoids appear stable and clear. Periapical left maxillary molar dental lucency appears inflammatory in nature. No calvarium fracture identified. No scalp hematoma. Orbits soft tissues appear stable.  Chronic Calcified atherosclerosis at the skull base. Cerebral volume is stable and normal for age. No ventriculomegaly. No midline shift, mass effect, or evidence of intracranial mass lesion. No acute intracranial hemorrhage identified. No evidence of cortically based acute infarction identified. Normal for age gray-white matter differentiation.  CT CERVICAL SPINE FINDINGS  Multilevel chronic cervical spondylolisthesis. Pronounced chronic anterolisthesis at C3-C4 appears stable since 2013. Mild anterolisthesis at C2-C3 and C7-T1 also appear stable. Associated chronic cervical facet arthropathy. Bilateral cervical posterior element alignment appears stable. Superimposed severe chronic lower cervical disc and endplate with chronic endplate osteophytosis. Degeneration occipital condyles to C1 alignment appears stable. Chronic C1-C2  degeneration may be mildly progressed since 2013. Stable C1-C2 alignment degenerative odontoid tip subchondral cysts do appear increased. No acute cervical spine fracture identified.  However, there is a small volume of abnormal prevertebral soft tissue fluid which is new. This is primarily about the C4 level (series 5, image 48).  Chronic calcified carotid atherosclerosis in the neck. Stable other noncontrast paraspinal soft tissues. Grossly intact visualized upper thoracic levels.  IMPRESSION: 1. New abnormal prevertebral fluid at the C4 level, suspicious for acute cervical anterior ligamentous injury in this setting. Cervical spine MRI without contrast would evaluate further. 2. No acute fracture or listhesis identified in the cervical spine. Chronic advanced cervical spondylolisthesis and degenerative changes. 3. Stable and negative for age noncontrast CT appearance of the brain.  Electronically Signed: By: Genevie Ann M.D. On: 01/14/2015 11:54   Ct Cervical Spine Wo Contrast  01/14/2015   ADDENDUM REPORT: 01/14/2015 12:08  ADDENDUM: Study discussed by telephone with Dr. Reyne Dumas on 01/14/2015 at 1204 hours.   Electronically Signed   By: Genevie Ann M.D.   On: 01/14/2015 12:08   01/14/2015   CLINICAL DATA:  81 year old female who fell and was unconscious. Headache and cervical neck pain. Initial encounter.  EXAM: CT HEAD WITHOUT CONTRAST  CT CERVICAL SPINE WITHOUT CONTRAST  TECHNIQUE: Multidetector CT imaging of the head and cervical spine was performed following the standard protocol without intravenous contrast. Multiplanar CT image reconstructions of the cervical spine were also generated.  COMPARISON:  [facility] CT without contrast 718 2016, 01/08/2015. Cervical spine radiographs 09/05/2013. Cervical spine CT 07/10/2011.  FINDINGS: CT HEAD FINDINGS  Paranasal sinuses and mastoids appear stable and clear. Periapical left maxillary molar dental lucency appears inflammatory in nature. No  calvarium fracture identified. No scalp hematoma. Orbits soft tissues appear stable.  Chronic Calcified atherosclerosis at the skull base. Cerebral volume is stable and normal for age. No ventriculomegaly. No midline shift, mass effect, or evidence of intracranial mass lesion. No acute intracranial hemorrhage identified. No evidence of cortically based acute infarction identified. Normal for age gray-white matter differentiation.  CT CERVICAL SPINE FINDINGS  Multilevel chronic cervical spondylolisthesis. Pronounced chronic anterolisthesis at C3-C4 appears stable since 2013. Mild anterolisthesis at C2-C3 and C7-T1 also appear stable. Associated chronic cervical facet arthropathy. Bilateral cervical posterior element alignment appears stable. Superimposed severe chronic lower cervical disc and endplate with chronic endplate osteophytosis. Degeneration occipital condyles to C1 alignment appears stable. Chronic C1-C2 degeneration may be mildly progressed since 2013. Stable C1-C2 alignment degenerative odontoid tip subchondral cysts do appear increased. No acute cervical spine fracture identified.  However, there is a small volume of abnormal prevertebral soft tissue fluid which is new. This is primarily about the C4 level (series 5, image 48).  Chronic calcified carotid atherosclerosis in the neck. Stable other noncontrast paraspinal soft tissues. Grossly intact visualized upper thoracic levels.  IMPRESSION: 1. New abnormal prevertebral fluid at the C4 level, suspicious for acute cervical anterior ligamentous injury in this setting. Cervical spine MRI without contrast would evaluate further. 2. No acute fracture or listhesis identified in the cervical spine. Chronic advanced cervical spondylolisthesis and degenerative changes. 3. Stable and negative for age noncontrast CT appearance of the brain.  Electronically Signed: By: Genevie Ann M.D. On: 01/14/2015 11:54   Mr Cervical Spine Wo Contrast  01/14/2015   CLINICAL DATA:   Headaches and neck pain.  Fall 01/12/2015.  EXAM: MRI CERVICAL SPINE WITHOUT CONTRAST  TECHNIQUE: Multiplanar, multisequence MR imaging of the cervical spine was performed. No intravenous contrast was administered.  COMPARISON:  Cervical spine CT 01/14/2015 and MRI 07/22/2005  FINDINGS: Study is mildly to moderately motion degraded despite repeating some sequences.  There is mild prevertebral STIR hyperintensity at C3-4 compatible with edema as seen on CT earlier today. No frank disruption of the anterior or posterior longitudinal ligaments is identified. No posterior soft tissue edema suggestive of posterior ligamentous complex injury is identified. No vertebral marrow edema is seen to suggest acute osseous injury.  Vertebral alignment is unchanged from the 2007 MRI with grade 1 anterolisthesis of C2 on C3, C3 on C4, and C7 on T1. Vertebral body heights are preserved. Moderate to severe disc space narrowing is present from C4-5 to C6-7. Craniocervical junction is unremarkable. Cervical spinal cord is normal in signal. 11 mm T2 hyperintense right thyroid nodule is noted.  C2-3: Advanced right and mild left facet arthrosis, mild disc uncovering,  and infolding of the ligamentum flavum result in moderate spinal stenosis, increased from the prior MRI.  C3-4: Anterolisthesis with disc uncovering and advanced bilateral facet arthrosis result in mild spinal stenosis, slightly more prominent than on the prior MRI. There is at least moderate bilateral neural foraminal stenosis, increased from prior MRI.  C4-5: Broad-based posterior disc osteophyte complex and uncovertebral spurring result in mild spinal stenosis asymmetric to the right and moderate to severe right and moderate left neural foraminal stenosis, grossly similar to the prior MRI.  C5-6: Broad-based posterior disc osteophyte complex asymmetric to the right and uncovertebral spurring results in mild spinal stenosis in severe bilateral neural foraminal stenosis,  similar to the prior MRI.  C6-7: Broad-based posterior disc osteophyte complex and uncovertebral spurring result in mild spinal stenosis and moderate bilateral neural foraminal stenosis. The neural foraminal stenosis may have mildly progressed from the prior MRI.  C7-T1: Slight anterolisthesis and disc uncovering without significant stenosis, unchanged.  IMPRESSION: 1. Motion degraded examination. Mild prevertebral edema in the upper cervical spine without frank ligamentous disruption identified. 2. Multilevel cervical disc and facet degeneration as above, mildly progressed from the prior MRI.   Electronically Signed   By: Logan Bores   On: 01/14/2015 20:48   Pelvis Portable  01/15/2015   CLINICAL DATA:  81 year old female with post of for left hip fracture.  EXAM: PORTABLE PELVIS 1-2 VIEWS  COMPARISON:  Radiograph dated 12/1914  FINDINGS: There has been interval placement of an intra medullary rod and screw traversing femoral intertrochanteric fracture. The bones are osteopenic. There are osteoarthritic changes of the hip joints. No acute fracture identified.  IMPRESSION: Left femoral intertrochanteric fracture status post placement of orthopedic hardware.   Electronically Signed   By: Anner Crete M.D.   On: 01/15/2015 18:01   Dg Chest Port 1 View  01/17/2015   CLINICAL DATA:  Shortness of Breath  EXAM: PORTABLE CHEST - 1 VIEW  COMPARISON:  01/15/2015  FINDINGS: Cardiac shadow is again enlarged. Mild vascular congestion is seen. No focal confluent infiltrate is noted. No acute bony abnormality is seen.  IMPRESSION: Mild CHF.   Electronically Signed   By: Inez Catalina M.D.   On: 01/17/2015 08:45   Dg Chest Port 1 View  01/15/2015   CLINICAL DATA:  Decreased oxygen saturation following surgery.  EXAM: PORTABLE CHEST - 1 VIEW  COMPARISON:  01/13/2015; 01/12/2015; 12/27/2013  FINDINGS: Grossly unchanged enlarged cardiac silhouette and mediastinal contours with atherosclerotic plaque within the thoracic  aorta. Lung volumes are reduced with worsening bibasilar heterogeneous/consolidative opacities, left greater than right. Pulmonary vasculature appears less distinct than present examination. Trace bilateral effusions are not excluded. No pneumothorax. Unchanged bones.  IMPRESSION: Suspected mild worsening pulmonary edema and bibasilar atelectasis on this hypoventilated AP portable examination. Further evaluation with a PA and lateral chest radiograph may be obtained as clinically indicated.   Electronically Signed   By: Sandi Mariscal M.D.   On: 01/15/2015 18:32   Chest Portable 1 View  01/13/2015   CLINICAL DATA:  Preop for left femur fracture  EXAM: PORTABLE CHEST - 1 VIEW  COMPARISON:  01/12/2015  FINDINGS: Moderate to severe cardiac enlargement. Calcified aortic arch. Mild vascular congestion. No evidence of consolidation or edema. Minimal right lower lobe scarring or atelectasis.  IMPRESSION: No acute findings. Stable cardiac enlargement with vascular congestion but no pulmonary edema.   Electronically Signed   By: Skipper Cliche M.D.   On: 01/13/2015 21:50   Dg Hip Operative Unilat With  Pelvis Left  01/15/2015   CLINICAL DATA:  Patient with recent diagnosis mildly displaced left femoral intertrochanteric fracture  EXAM: OPERATIVE LEFT HIP (WITH PELVIS IF PERFORMED) 4 VIEWS  TECHNIQUE: Fluoroscopic spot image(s) were submitted for interpretation post-operatively.  COMPARISON:  Hip radiograph 01/13/2015  FINDINGS: Patient status post intra medullary rod and screw fixation of comminuted proximal left femur fracture involving the intertrochanteric region. Hardware appears in appropriate position. Improved anatomic alignment.  IMPRESSION: Patient status post ORIF proximal left femur fracture.   Electronically Signed   By: Lovey Newcomer M.D.   On: 01/15/2015 15:40     CBC  Recent Labs Lab 01/16/15 0302 01/16/15 1710 01/17/15 0130 01/17/15 0445 01/18/15 0435 01/19/15 0325  WBC 9.0 8.2  --  9.5 10.1  10.0  HGB 8.8* 7.4* 8.6* 8.8* 8.9* 9.1*  HCT 28.2* 23.5* 27.0* 27.8* 27.9* 28.5*  PLT 218 200  --  211 241 239  MCV 97.6 96.7  --  94.9 94.9 96.9  MCH 30.4 30.5  --  30.0 30.3 31.0  MCHC 31.2 31.5  --  31.7 31.9 31.9  RDW 13.6 13.5  --  14.9 14.4 14.2    Chemistries   Recent Labs Lab 01/13/15 2119 01/15/15 0400 01/16/15 0302 01/17/15 0445 01/17/15 1115 01/18/15 0955 01/19/15 0325  NA 143 143 143 139 137 141 141  K 3.9 3.7 3.4* 3.9 3.8 3.8 3.3*  CL 104 100* 98* 97* 97* 101 98*  CO2 27 33* 34* 34* 31 32 35*  GLUCOSE 153* 99 101* 152* 216* 146* 134*  BUN 27* 21* 16 27* 27* 20 16  CREATININE 1.32* 1.10* 1.06* 1.44* 1.31* 0.95 0.86  CALCIUM 8.8* 9.2 8.8* 8.5* 8.5* 8.9 8.6*  AST 24 17 22   --  21 22  --   ALT 18 16 16   --  6* 9*  --   ALKPHOS 71 64 63  --  59 70  --   BILITOT 0.9 0.8 0.7  --  0.7 1.2  --    ------------------------------------------------------------------------------------------------------------------ estimated creatinine clearance is 42.6 mL/min (by C-G formula based on Cr of 0.86). ------------------------------------------------------------------------------------------------------------------ No results for input(s): HGBA1C in the last 72 hours. ------------------------------------------------------------------------------------------------------------------ No results for input(s): CHOL, HDL, LDLCALC, TRIG, CHOLHDL, LDLDIRECT in the last 72 hours. ------------------------------------------------------------------------------------------------------------------ No results for input(s): TSH, T4TOTAL, T3FREE, THYROIDAB in the last 72 hours.  Invalid input(s): FREET3 ------------------------------------------------------------------------------------------------------------------ No results for input(s): VITAMINB12, FOLATE, FERRITIN, TIBC, IRON, RETICCTPCT in the last 72 hours.  Coagulation profile  Recent Labs Lab 01/15/15 1228 01/16/15 0302  01/17/15 0445 01/18/15 0435 01/19/15 0325  INR 1.84* 1.24 1.77* 2.77* 3.28*    No results for input(s): DDIMER in the last 72 hours.  Cardiac Enzymes  Recent Labs Lab 01/16/15 1832 01/17/15 0130 01/17/15 0445  TROPONINI 0.27* 0.29* 0.30*   ------------------------------------------------------------------------------------------------------------------ Invalid input(s): POCBNP   CBG:  Recent Labs Lab 01/18/15 0746 01/18/15 1222 01/18/15 1655 01/18/15 2210 01/19/15 0759  GLUCAP 137* 189* 82 134* 125*       Studies: No results found.    Lab Results  Component Value Date   HGBA1C 7.3 11/04/2014   HGBA1C 7..2% 06/12/2014   HGBA1C 7.0* 12/28/2013   Lab Results  Component Value Date   LDLCALC 48 11/04/2014   CREATININE 0.86 01/19/2015       Scheduled Meds: . amiodarone  100 mg Oral Daily  . aspirin  81 mg Oral Daily  . atorvastatin  40 mg Oral q1800  . budesonide-formoterol  1 puff Inhalation  BID  . cholecalciferol  1,000 Units Oral Daily  . feeding supplement (GLUCERNA SHAKE)  237 mL Oral BID BM  . ferrous sulfate  325 mg Oral TID PC  . fluticasone  1 spray Each Nare BID  . insulin aspart  0-5 Units Subcutaneous QHS  . insulin aspart  0-9 Units Subcutaneous TID WC  . insulin detemir  8 Units Subcutaneous q morning - 10a  . LORazepam  0.5 mg Intravenous Once  . pantoprazole  40 mg Oral BID  . potassium chloride SA  20 mEq Oral Daily  . senna-docusate  1 tablet Oral BID  . vitamin C  500 mg Oral QHS  . Warfarin - Pharmacist Dosing Inpatient   Does not apply q1800   Continuous Infusions: . sodium chloride 50 mL/hr (01/18/15 1155)  . lactated ringers 10 mL/hr at 01/15/15 1409    Active Problems:   PAF, recurrance this admission, on chronic Amio/ Coumadin   COPD with emphysema   Chronic respiratory failure with hypoxia   HTN (hypertension)   CAD, remote RCA PCI, subsequently noted to be occluded, last cath 8/11   DM type 2 causing CKD stage  3   Hip fracture   Preop cardiovascular exam   Hip fracture requiring operative repair    Time spent: 16 minutes   [facility] Pager [phone]. If 7PM-7AM, please contact night-coverage at [email or web address], password [facility] 01/19/2015, 8:54 AM  LOS: 6 days

## 2015-01-19 NOTE — Progress Notes (Signed)
ANTICOAGULATION CONSULT NOTE - Follow Up Consult  Pharmacy Consult for Coumadin Indication: atrial fibrillation  Allergies  Allergen Reactions  . Atorvastatin Other (See Comments)     muscle aches  . Codeine Nausea And Vomiting  . Morphine Nausea And Vomiting  . Other Other (See Comments)    OPIATES cause nausea and vomiting  . Rosuvastatin Other (See Comments)    muscle aches at high doses, held as of 12/2010 due to aches  . Iohexol Other (See Comments)     Desc: unknown reaction; allergic to iodine and contrast     Patient Measurements: Height: 5\' 1"  (154.9 cm) Weight: 147 lb 4.3 oz (66.8 kg) IBW/kg (Calculated) : 47.8 Heparin Dosing Weight:    Vital Signs: Temp: 98.1 F (36.7 C) (07/25 1210) Temp Source: Oral (07/25 1210) BP: 123/61 mmHg (07/25 1210) Pulse Rate: 82 (07/25 1210)  Labs:  Recent Labs  01/16/15 1832 01/17/15 0130  01/17/15 0445 01/17/15 1115 01/18/15 0435 01/18/15 0955 01/19/15 0325  HGB  --  8.6*  --  8.8*  --  8.9*  --  9.1*  HCT  --  27.0*  --  27.8*  --  27.9*  --  28.5*  PLT  --   --   --  211  --  241  --  239  LABPROT  --   --   --  20.6*  --  28.8*  --  32.7*  INR  --   --   --  1.77*  --  2.77*  --  3.28*  CREATININE  --   --   < > 1.44* 1.31*  --  0.95 0.86  TROPONINI 0.27* 0.29*  --  0.30*  --   --   --   --   < > = values in this interval not displayed.  Estimated Creatinine Clearance: 42.6 mL/min (by C-G formula based on Cr of 0.86).  Assessment: AC: Afib on warfarin PTA 5 mg daily except none on Saturdays. Note from PharmD at [facility] from 7/11 had INR of 1.2. Per family warfarin was held last 2 days. Reversed INR for surgery 7/21.  INR 1.77>>2.77, Lovenox for DVT ppx stopped. INR up today to 3.28.  Goal of Therapy:  INR 2-3 Monitor platelets by anticoagulation protocol: Yes   Plan:  Hold Coumadin today   Laetitia Schnepf S. Alford Highland, PharmD, BCPS Clinical Staff Pharmacist Pager [phone]  Eilene Ghazi  Stillinger 01/19/2015,2:09 PM

## 2015-01-20 ENCOUNTER — Ambulatory Visit (HOSPITAL_COMMUNITY): Payer: No Typology Code available for payment source | Attending: Internal Medicine

## 2015-01-20 DIAGNOSIS — I10 Essential (primary) hypertension: Secondary | ICD-10-CM | POA: Diagnosis not present

## 2015-01-20 DIAGNOSIS — R079 Chest pain, unspecified: Secondary | ICD-10-CM | POA: Insufficient documentation

## 2015-01-20 DIAGNOSIS — E785 Hyperlipidemia, unspecified: Secondary | ICD-10-CM | POA: Diagnosis not present

## 2015-01-20 DIAGNOSIS — I4510 Unspecified right bundle-branch block: Secondary | ICD-10-CM | POA: Insufficient documentation

## 2015-01-20 DIAGNOSIS — I081 Rheumatic disorders of both mitral and tricuspid valves: Secondary | ICD-10-CM | POA: Diagnosis not present

## 2015-01-20 DIAGNOSIS — E119 Type 2 diabetes mellitus without complications: Secondary | ICD-10-CM | POA: Insufficient documentation

## 2015-01-20 DIAGNOSIS — J449 Chronic obstructive pulmonary disease, unspecified: Secondary | ICD-10-CM | POA: Diagnosis not present

## 2015-01-20 LAB — GLUCOSE, CAPILLARY
GLUCOSE-CAPILLARY: 118 mg/dL — AB (ref 65–99)
GLUCOSE-CAPILLARY: 121 mg/dL — AB (ref 65–99)
Glucose-Capillary: 130 mg/dL — ABNORMAL HIGH (ref 65–99)
Glucose-Capillary: 120 mg/dL — ABNORMAL HIGH (ref 65–99)

## 2015-01-20 LAB — BASIC METABOLIC PANEL
Anion gap: 8 (ref 5–15)
BUN: 16 mg/dL (ref 6–20)
CO2: 34 mmol/L — AB (ref 22–32)
Calcium: 8.2 mg/dL — ABNORMAL LOW (ref 8.9–10.3)
Chloride: 100 mmol/L — ABNORMAL LOW (ref 101–111)
Creatinine, Ser: 0.91 mg/dL (ref 0.44–1.00)
GFR, EST NON AFRICAN AMERICAN: 56 mL/min — AB (ref >60)
Glucose, Bld: 129 mg/dL — ABNORMAL HIGH (ref 65–99)
Potassium: 3.6 mmol/L (ref 3.5–5.1)
SODIUM: 142 mmol/L (ref 135–145)

## 2015-01-20 LAB — PROTIME-INR
INR: 3.35 — AB (ref 0.00–1.49)
Prothrombin Time: 33.3 seconds — ABNORMAL HIGH (ref 11.6–15.2)

## 2015-01-20 MED ORDER — PERFLUTREN LIPID MICROSPHERE
1.0000 mL | INTRAVENOUS | Status: AC | PRN
  Administered 2015-01-20: 2 mL via INTRAVENOUS
  Filled 2015-01-20: qty 10

## 2015-01-20 MED ORDER — METOPROLOL SUCCINATE 12.5 MG HALF TABLET
12.5000 mg | ORAL_TABLET | Freq: Every day | ORAL | Status: DC
  Administered 2015-01-20: 12.5 mg via ORAL
  Filled 2015-01-20: qty 1

## 2015-01-20 NOTE — Progress Notes (Signed)
Speech Language Pathology Treatment: Dysphagia  Patient Details Name: Jenica Costilow MRN: [id number] DOB: 1928-06-24 Today's Date: 01/20/2015 Time: 0900-0930 SLP Time Calculation (min) (ACUTE ONLY): 30 min  Assessment / Plan / Recommendation Clinical Impression  Pt tolerating small sips and regular solids well with no coughing or pocketing today. Pt very articulate. No SLP f/u needed, continue regular diet and thin liquids with basic precautions.    HPI Other Pertinent Information: 81 yo female with PMH: GERD, anxiety, COPD, SOB, CHF, bradycardia, HTN admitted following fall sustaining hip fx at [facility] where she was admitted due to headache and severe confusion Family requested transfer to [facility]. Underwent IM left hip 7/21. MRI of spine revealed Mild prevertebral edema in the upper   Pertinent Vitals Pain Assessment: Faces Faces Pain Scale: Hurts little more Pain Intervention(s): Repositioned  SLP Plan  All goals met    Recommendations Diet recommendations: Regular;Thin liquid Liquids provided via: Cup;No straw Medication Administration: Whole meds with puree Supervision: Patient able to self feed;Full supervision/cueing for compensatory strategies Compensations: Slow rate;Small sips/bites Postural Changes and/or Swallow Maneuvers: Seated upright 90 degrees              Plan: All goals met    GO    Herbie Baltimore, MA CCC-SLP [phone]  Lynann Beaver 01/20/2015, 9:31 AM

## 2015-01-20 NOTE — Progress Notes (Signed)
[name]  [name] MVH:[id number] DOB: [birth year] DOA: 01/13/2015 PCP: Redge Gainer, MD  Assessment/Plan: Active Problems:   PAF, recurrance this admission, on chronic Amio/ Coumadin   COPD with emphysema   Chronic respiratory failure with hypoxia   HTN (hypertension)   CAD, remote RCA PCI, subsequently noted to be occluded, last cath 8/11   DM type 2 causing CKD stage 3   Hip fracture   Preop cardiovascular exam   Hip fracture requiring operative repair     Mechanical fall at an outside facility,Left intertrochanteric femur fracture/neck pain Status post closed intramedullary nailing of the left intertrochanteric fracture on 7/21 Protracted postoperative course, Patient became hypoxic requiring 100% nonrebreather in the PACU post surgery Transferred to step down and BiPAP ordered however the patient refused BiPAP and was combative and agitated and remained on 6 L of nasal cannula Now transferred back to telemetry and hypoxemia is improving  physical therapy recommends SNF Anticipate discharge to SNF in the next 1-2 days Patient received FFP and vitamin K prior to surgery Had significant headache and neck pain after her  fall,   CT head and neck negative for intracranial bleed and fracture MRI of the cervical spine does not show any ligamentous disruption, shows facet arthropathy Dilaudid and baclofen for headache and neck pain,     Abnormal troponin Likely demand ischemia from anemia and hypotension  cardiology feels that the patient is stable from a cardiac standpoint and has signed off Troponin elevation is flat 2-D echo still pending Continue aspirin  Acute Hypoxemic hypercapnic respiratory failure, improving Combination of GOLD stage IV COPD and pulmonary edema/acute on chronic diastolic CHF  Most recent echo 12/2013 showed LV EF of 55-60% Resumed 20 mg Lasix IV twice a day and continue slow diuresis Patient also has atelectasis,  oxygen requirements is improved from 6 L down to 4 L with the help of diuresis  Continue incentive spirometry Ambulate as tolerated SLP recommends regular diet with thin liquids  Hypokalemia Replete   History of coronary artery disease, chronic diastolic congestive heart failure without exacerbation, paroxysmal atrial fibrillation, peripheral vascular disease Cardiology consulted for clearance prior to surgery EKG shows first-degree AV block, right bundle branch block Patient will be at increased risk for surgery, however no active chest pain, Cardiology recommended to proceed with surgery without further cardiac evaluation Continue amiodarone for atrial fibrillation Mild increase in the patient's troponin likely secondary to demand ischemia in the setting of anemia and hypotension Repeat 2-D echo pending, Hold  imdur  and nifedipine as blood pressure is soft, and diuretics have been resumed   Acute blood loss anemia  during surgery- hg dropped from 11.2 to 7.4 today , on POD#1 Status post transfusion of one unit of packed red blood cells 7/22, hemoglobin now improving 9.1 today Follow-up CBC stable  Atrial fibrillation on anticoagulation-rate controlled Supratherapeutic INR upon admission Patient received a total of 15 mg of vitamin K since her admission to reverse coagulopathy for surgery and received FFP INR reversed for surgery, restarted Coumadin, now INR is supratherapeutic and Coumadin is on hold  Peripheral vascular disease, on aspirin at home  Diabetes continue NovoLog and Levemir, CBG stable  Hypertension holding nifedipine ,  IMDUR , as blood pressure was soft. resume Toprol-XL as blood pressure now trending up,   patient required fluid boluses and packed red blood cells to stabilize her blood pressure.  Blood pressure is better this morning, hypotension seems to  have resolved Restart metoprolol ,  Acute delirium: multifactorial due to all of the above , better this  am   Oxygen dependent COPD, continue Symbicort, when necessary nebulizer treatments, o2 requirements improving    Code Status:      Code Status Orders        Start     Ordered   01/13/15 2020  Full code   Continuous     01/13/15 2019     Family Communication: family updated about patient's clinical progress Disposition Plan:    Anticipated to discharge to SNF tomorrow   Brief narrative: [name] is a 81 y.o. female with a history of CAD, diastolic CHF, chronic RBBB, Paroxysmal AF (on coumadin and low dose amiodarone), PVD (moderate carotid and LSCA disease), DM2, HTN, HL,COPD on home O2, anxiety, former smoker and DM who was admitted to [facility] on 7/14 for headaches and neck pain. W/U showed EColi UTI and pt received IV Rocephin. Head CT showed old lacunar infarct. She became confused/ delirium in the hospital. She had a fell 7/18 and broke her L hip (intertrochanteric fx). She was transferred to [facility] for ortho eval and surgery. She was ambulatory with a walker and lived independently at home before her recent hospitalization. She denies any chest pain or palpitation.   Heart cath in 2006 showed an occluded RCA with left-to-right collaterals & otherwise noncritical CAD with EF=25-30% at that time. Her last cath was performed by Dr. Ellouise Newer 01/25/2010 revealed a chronically totally occluded dominant RCA, 50% proximal circumflex with left to right collaterals and normal LV function > medical therapy recommended.  Most recent echo 12/2013 showed LV EF of 55-60%, mild LVH, interobasal akinesis, grade 2 DD, moderate LAE and mild MR. She was doing well on cardiac stand point of view when last seen by Dr. Gwenlyn Found 11/2013.  Consultants:  Cardiology  Orthopedics    Procedures:  None  Antibiotics: Anti-infectives    Start     Dose/Rate Route Frequency Ordered Stop   01/15/15 2100  ceFAZolin (ANCEF) IVPB 2 g/50 mL premix     2 g 100 mL/hr over 30 Minutes Intravenous  Every 6 hours 01/15/15 2027 01/16/15 0310   01/15/15 1430  ceFAZolin (ANCEF) IVPB 2 g/50 mL premix     2 g 100 mL/hr over 30 Minutes Intravenous To [facility] 01/14/15 1259 01/15/15 1450         HPI/Subjective: Less  confused last night, complaining of neck pain Objective: Filed Vitals:   01/19/15 2318 01/20/15 0543 01/20/15 0606 01/20/15 0907  BP:  152/66    Pulse:  66    Temp:  97.8 F (36.6 C)    TempSrc:  Oral    Resp:  18    Height:      Weight: 67.4 kg (148 lb 9.4 oz)  65.7 kg (144 lb 13.5 oz)   SpO2:    98%    Intake/Output Summary (Last 24 hours) at 01/20/15 1019 Last data filed at 01/20/15 0606  Gross per 24 hour  Intake    440 ml  Output    850 ml  Net   -410 ml    Exam:  General: Uncomfortable, confused , disoriented  Lungs: Clear to auscultation bilaterally without wheezes or crackles Cardiovascular: Regular rate and rhythm without murmur gallop or rub normal S1 and S2 Abdomen: Nontender, nondistended, soft, bowel sounds positive, no rebound, no ascites, no appreciable mass Extremities: No significant cyanosis, clubbing, or edema bilateral lower extremities  Data Review   Micro Results Recent Results (from the past 240 hour(s))  Surgical pcr screen     Status: None   Collection Time: 01/15/15  6:10 AM  Result Value Ref Range Status   MRSA, PCR NEGATIVE NEGATIVE Final   Staphylococcus aureus NEGATIVE NEGATIVE Final    Comment:        The Xpert SA Assay (FDA approved for NASAL specimens in patients over 63 years of age), is one component of a comprehensive surveillance program.  Test performance has been validated by [facility] for patients greater than or equal to 38 year old. It is not intended to diagnose infection nor to guide or monitor treatment.   MRSA PCR Screening     Status: None   Collection Time: 01/15/15  8:56 PM  Result Value Ref Range Status   MRSA by PCR NEGATIVE NEGATIVE Final    Comment:        The  GeneXpert MRSA Assay (FDA approved for NASAL specimens only), is one component of a comprehensive MRSA colonization surveillance program. It is not intended to diagnose MRSA infection nor to guide or monitor treatment for MRSA infections.     Radiology Reports Ct Head Wo Contrast  01/14/2015   ADDENDUM REPORT: 01/14/2015 12:08  ADDENDUM: Study discussed by telephone with Dr. Reyne Dumas on 01/14/2015 at 1204 hours.   Electronically Signed   By: Genevie Ann M.D.   On: 01/14/2015 12:08   01/14/2015   CLINICAL DATA:  81 year old female who fell and was unconscious. Headache and cervical neck pain. Initial encounter.  EXAM: CT HEAD WITHOUT CONTRAST  CT CERVICAL SPINE WITHOUT CONTRAST  TECHNIQUE: Multidetector CT imaging of the head and cervical spine was performed following the standard protocol without intravenous contrast. Multiplanar CT image reconstructions of the cervical spine were also generated.  COMPARISON:  [facility] CT without contrast 718 2016, 01/08/2015. Cervical spine radiographs 09/05/2013. Cervical spine CT 07/10/2011.  FINDINGS: CT HEAD FINDINGS  Paranasal sinuses and mastoids appear stable and clear. Periapical left maxillary molar dental lucency appears inflammatory in nature. No calvarium fracture identified. No scalp hematoma. Orbits soft tissues appear stable.  Chronic Calcified atherosclerosis at the skull base. Cerebral volume is stable and normal for age. No ventriculomegaly. No midline shift, mass effect, or evidence of intracranial mass lesion. No acute intracranial hemorrhage identified. No evidence of cortically based acute infarction identified. Normal for age gray-white matter differentiation.  CT CERVICAL SPINE FINDINGS  Multilevel chronic cervical spondylolisthesis. Pronounced chronic anterolisthesis at C3-C4 appears stable since 2013. Mild anterolisthesis at C2-C3 and C7-T1 also appear stable. Associated chronic cervical facet arthropathy. Bilateral  cervical posterior element alignment appears stable. Superimposed severe chronic lower cervical disc and endplate with chronic endplate osteophytosis. Degeneration occipital condyles to C1 alignment appears stable. Chronic C1-C2 degeneration may be mildly progressed since 2013. Stable C1-C2 alignment degenerative odontoid tip subchondral cysts do appear increased. No acute cervical spine fracture identified.  However, there is a small volume of abnormal prevertebral soft tissue fluid which is new. This is primarily about the C4 level (series 5, image 48).  Chronic calcified carotid atherosclerosis in the neck. Stable other noncontrast paraspinal soft tissues. Grossly intact visualized upper thoracic levels.  IMPRESSION: 1. New abnormal prevertebral fluid at the C4 level, suspicious for acute cervical anterior ligamentous injury in this setting. Cervical spine MRI without contrast would evaluate further. 2. No acute fracture or listhesis identified in the cervical spine. Chronic advanced cervical spondylolisthesis and degenerative changes.  3. Stable and negative for age noncontrast CT appearance of the brain.  Electronically Signed: By: Genevie Ann M.D. On: 01/14/2015 11:54   Ct Cervical Spine Wo Contrast  01/14/2015   ADDENDUM REPORT: 01/14/2015 12:08  ADDENDUM: Study discussed by telephone with Dr. Reyne Dumas on 01/14/2015 at 1204 hours.   Electronically Signed   By: Genevie Ann M.D.   On: 01/14/2015 12:08   01/14/2015   CLINICAL DATA:  81 year old female who fell and was unconscious. Headache and cervical neck pain. Initial encounter.  EXAM: CT HEAD WITHOUT CONTRAST  CT CERVICAL SPINE WITHOUT CONTRAST  TECHNIQUE: Multidetector CT imaging of the head and cervical spine was performed following the standard protocol without intravenous contrast. Multiplanar CT image reconstructions of the cervical spine were also generated.  COMPARISON:  [facility] CT without contrast 718 2016, 01/08/2015. Cervical  spine radiographs 09/05/2013. Cervical spine CT 07/10/2011.  FINDINGS: CT HEAD FINDINGS  Paranasal sinuses and mastoids appear stable and clear. Periapical left maxillary molar dental lucency appears inflammatory in nature. No calvarium fracture identified. No scalp hematoma. Orbits soft tissues appear stable.  Chronic Calcified atherosclerosis at the skull base. Cerebral volume is stable and normal for age. No ventriculomegaly. No midline shift, mass effect, or evidence of intracranial mass lesion. No acute intracranial hemorrhage identified. No evidence of cortically based acute infarction identified. Normal for age gray-white matter differentiation.  CT CERVICAL SPINE FINDINGS  Multilevel chronic cervical spondylolisthesis. Pronounced chronic anterolisthesis at C3-C4 appears stable since 2013. Mild anterolisthesis at C2-C3 and C7-T1 also appear stable. Associated chronic cervical facet arthropathy. Bilateral cervical posterior element alignment appears stable. Superimposed severe chronic lower cervical disc and endplate with chronic endplate osteophytosis. Degeneration occipital condyles to C1 alignment appears stable. Chronic C1-C2 degeneration may be mildly progressed since 2013. Stable C1-C2 alignment degenerative odontoid tip subchondral cysts do appear increased. No acute cervical spine fracture identified.  However, there is a small volume of abnormal prevertebral soft tissue fluid which is new. This is primarily about the C4 level (series 5, image 48).  Chronic calcified carotid atherosclerosis in the neck. Stable other noncontrast paraspinal soft tissues. Grossly intact visualized upper thoracic levels.  IMPRESSION: 1. New abnormal prevertebral fluid at the C4 level, suspicious for acute cervical anterior ligamentous injury in this setting. Cervical spine MRI without contrast would evaluate further. 2. No acute fracture or listhesis identified in the cervical spine. Chronic advanced cervical  spondylolisthesis and degenerative changes. 3. Stable and negative for age noncontrast CT appearance of the brain.  Electronically Signed: By: Genevie Ann M.D. On: 01/14/2015 11:54   Mr Cervical Spine Wo Contrast  01/14/2015   CLINICAL DATA:  Headaches and neck pain.  Fall 01/12/2015.  EXAM: MRI CERVICAL SPINE WITHOUT CONTRAST  TECHNIQUE: Multiplanar, multisequence MR imaging of the cervical spine was performed. No intravenous contrast was administered.  COMPARISON:  Cervical spine CT 01/14/2015 and MRI 07/22/2005  FINDINGS: Study is mildly to moderately motion degraded despite repeating some sequences.  There is mild prevertebral STIR hyperintensity at C3-4 compatible with edema as seen on CT earlier today. No frank disruption of the anterior or posterior longitudinal ligaments is identified. No posterior soft tissue edema suggestive of posterior ligamentous complex injury is identified. No vertebral marrow edema is seen to suggest acute osseous injury.  Vertebral alignment is unchanged from the 2007 MRI with grade 1 anterolisthesis of C2 on C3, C3 on C4, and C7 on T1. Vertebral body heights are preserved. Moderate to severe disc space  narrowing is present from C4-5 to C6-7. Craniocervical junction is unremarkable. Cervical spinal cord is normal in signal. 11 mm T2 hyperintense right thyroid nodule is noted.  C2-3: Advanced right and mild left facet arthrosis, mild disc uncovering, and infolding of the ligamentum flavum result in moderate spinal stenosis, increased from the prior MRI.  C3-4: Anterolisthesis with disc uncovering and advanced bilateral facet arthrosis result in mild spinal stenosis, slightly more prominent than on the prior MRI. There is at least moderate bilateral neural foraminal stenosis, increased from prior MRI.  C4-5: Broad-based posterior disc osteophyte complex and uncovertebral spurring result in mild spinal stenosis asymmetric to the right and moderate to severe right and moderate left  neural foraminal stenosis, grossly similar to the prior MRI.  C5-6: Broad-based posterior disc osteophyte complex asymmetric to the right and uncovertebral spurring results in mild spinal stenosis in severe bilateral neural foraminal stenosis, similar to the prior MRI.  C6-7: Broad-based posterior disc osteophyte complex and uncovertebral spurring result in mild spinal stenosis and moderate bilateral neural foraminal stenosis. The neural foraminal stenosis may have mildly progressed from the prior MRI.  C7-T1: Slight anterolisthesis and disc uncovering without significant stenosis, unchanged.  IMPRESSION: 1. Motion degraded examination. Mild prevertebral edema in the upper cervical spine without frank ligamentous disruption identified. 2. Multilevel cervical disc and facet degeneration as above, mildly progressed from the prior MRI.   Electronically Signed   By: Logan Bores   On: 01/14/2015 20:48   Pelvis Portable  01/15/2015   CLINICAL DATA:  81 year old female with post of for left hip fracture.  EXAM: PORTABLE PELVIS 1-2 VIEWS  COMPARISON:  Radiograph dated 12/1914  FINDINGS: There has been interval placement of an intra medullary rod and screw traversing femoral intertrochanteric fracture. The bones are osteopenic. There are osteoarthritic changes of the hip joints. No acute fracture identified.  IMPRESSION: Left femoral intertrochanteric fracture status post placement of orthopedic hardware.   Electronically Signed   By: Anner Crete M.D.   On: 01/15/2015 18:01   Dg Chest Port 1 View  01/17/2015   CLINICAL DATA:  Shortness of Breath  EXAM: PORTABLE CHEST - 1 VIEW  COMPARISON:  01/15/2015  FINDINGS: Cardiac shadow is again enlarged. Mild vascular congestion is seen. No focal confluent infiltrate is noted. No acute bony abnormality is seen.  IMPRESSION: Mild CHF.   Electronically Signed   By: Inez Catalina M.D.   On: 01/17/2015 08:45   Dg Chest Port 1 View  01/15/2015   CLINICAL DATA:  Decreased  oxygen saturation following surgery.  EXAM: PORTABLE CHEST - 1 VIEW  COMPARISON:  01/13/2015; 01/12/2015; 12/27/2013  FINDINGS: Grossly unchanged enlarged cardiac silhouette and mediastinal contours with atherosclerotic plaque within the thoracic aorta. Lung volumes are reduced with worsening bibasilar heterogeneous/consolidative opacities, left greater than right. Pulmonary vasculature appears less distinct than present examination. Trace bilateral effusions are not excluded. No pneumothorax. Unchanged bones.  IMPRESSION: Suspected mild worsening pulmonary edema and bibasilar atelectasis on this hypoventilated AP portable examination. Further evaluation with a PA and lateral chest radiograph may be obtained as clinically indicated.   Electronically Signed   By: Sandi Mariscal M.D.   On: 01/15/2015 18:32   Chest Portable 1 View  01/13/2015   CLINICAL DATA:  Preop for left femur fracture  EXAM: PORTABLE CHEST - 1 VIEW  COMPARISON:  01/12/2015  FINDINGS: Moderate to severe cardiac enlargement. Calcified aortic arch. Mild vascular congestion. No evidence of consolidation or edema. Minimal right lower lobe scarring or  atelectasis.  IMPRESSION: No acute findings. Stable cardiac enlargement with vascular congestion but no pulmonary edema.   Electronically Signed   By: Skipper Cliche M.D.   On: 01/13/2015 21:50   Dg Hip Operative Unilat With Pelvis Left  01/15/2015   CLINICAL DATA:  Patient with recent diagnosis mildly displaced left femoral intertrochanteric fracture  EXAM: OPERATIVE LEFT HIP (WITH PELVIS IF PERFORMED) 4 VIEWS  TECHNIQUE: Fluoroscopic spot image(s) were submitted for interpretation post-operatively.  COMPARISON:  Hip radiograph 01/13/2015  FINDINGS: Patient status post intra medullary rod and screw fixation of comminuted proximal left femur fracture involving the intertrochanteric region. Hardware appears in appropriate position. Improved anatomic alignment.  IMPRESSION: Patient status post ORIF  proximal left femur fracture.   Electronically Signed   By: Lovey Newcomer M.D.   On: 01/15/2015 15:40     CBC  Recent Labs Lab 01/16/15 0302 01/16/15 1710 01/17/15 0130 01/17/15 0445 01/18/15 0435 01/19/15 0325  WBC 9.0 8.2  --  9.5 10.1 10.0  HGB 8.8* 7.4* 8.6* 8.8* 8.9* 9.1*  HCT 28.2* 23.5* 27.0* 27.8* 27.9* 28.5*  PLT 218 200  --  211 241 239  MCV 97.6 96.7  --  94.9 94.9 96.9  MCH 30.4 30.5  --  30.0 30.3 31.0  MCHC 31.2 31.5  --  31.7 31.9 31.9  RDW 13.6 13.5  --  14.9 14.4 14.2    Chemistries   Recent Labs Lab 01/13/15 2119 01/15/15 0400 01/16/15 0302 01/17/15 0445 01/17/15 1115 01/18/15 0955 01/19/15 0325 01/20/15 0519  NA 143 143 143 139 137 141 141 142  K 3.9 3.7 3.4* 3.9 3.8 3.8 3.3* 3.6  CL 104 100* 98* 97* 97* 101 98* 100*  CO2 27 33* 34* 34* 31 32 35* 34*  GLUCOSE 153* 99 101* 152* 216* 146* 134* 129*  BUN 27* 21* 16 27* 27* 20 16 16   CREATININE 1.32* 1.10* 1.06* 1.44* 1.31* 0.95 0.86 0.91  CALCIUM 8.8* 9.2 8.8* 8.5* 8.5* 8.9 8.6* 8.2*  AST 24 17 22   --  21 22  --   --   ALT 18 16 16   --  6* 9*  --   --   ALKPHOS 71 64 63  --  59 70  --   --   BILITOT 0.9 0.8 0.7  --  0.7 1.2  --   --    ------------------------------------------------------------------------------------------------------------------ estimated creatinine clearance is 40 mL/min (by C-G formula based on Cr of 0.91). ------------------------------------------------------------------------------------------------------------------ No results for input(s): HGBA1C in the last 72 hours. ------------------------------------------------------------------------------------------------------------------ No results for input(s): CHOL, HDL, LDLCALC, TRIG, CHOLHDL, LDLDIRECT in the last 72 hours. ------------------------------------------------------------------------------------------------------------------ No results for input(s): TSH, T4TOTAL, T3FREE, THYROIDAB in the last 72 hours.  Invalid  input(s): FREET3 ------------------------------------------------------------------------------------------------------------------ No results for input(s): VITAMINB12, FOLATE, FERRITIN, TIBC, IRON, RETICCTPCT in the last 72 hours.  Coagulation profile  Recent Labs Lab 01/16/15 0302 01/17/15 0445 01/18/15 0435 01/19/15 0325 01/20/15 0519  INR 1.24 1.77* 2.77* 3.28* 3.35*    No results for input(s): DDIMER in the last 72 hours.  Cardiac Enzymes  Recent Labs Lab 01/16/15 1832 01/17/15 0130 01/17/15 0445  TROPONINI 0.27* 0.29* 0.30*   ------------------------------------------------------------------------------------------------------------------ Invalid input(s): POCBNP   CBG:  Recent Labs Lab 01/19/15 0759 01/19/15 1259 01/19/15 1614 01/19/15 2123 01/20/15 0746  GLUCAP 125* 196* 119* 101* 130*       Studies: No results found.    Lab Results  Component Value Date   HGBA1C 7.3 11/04/2014   HGBA1C 7..2% 06/12/2014  HGBA1C 7.0* 12/28/2013   Lab Results  Component Value Date   LDLCALC 48 11/04/2014   CREATININE 0.91 01/20/2015       Scheduled Meds: . amiodarone  100 mg Oral Daily  . aspirin  81 mg Oral Daily  . atorvastatin  40 mg Oral q1800  . budesonide-formoterol  1 puff Inhalation BID  . cholecalciferol  1,000 Units Oral Daily  . feeding supplement (GLUCERNA SHAKE)  237 mL Oral BID BM  . ferrous sulfate  325 mg Oral TID PC  . fluticasone  1 spray Each Nare BID  . furosemide  20 mg Intravenous Q12H  . insulin aspart  0-5 Units Subcutaneous QHS  . insulin aspart  0-9 Units Subcutaneous TID WC  . insulin detemir  8 Units Subcutaneous q morning - 10a  . LORazepam  0.5 mg Intravenous Once  . metoprolol tartrate  12.5 mg Oral BID  . pantoprazole  40 mg Oral BID  . potassium chloride SA  40 mEq Oral Daily  . senna-docusate  1 tablet Oral BID  . vitamin C  500 mg Oral QHS  . Warfarin - Pharmacist Dosing Inpatient   Does not apply q1800    Continuous Infusions: . sodium chloride 50 mL/hr at 01/19/15 2323  . lactated ringers 10 mL/hr at 01/15/15 1409    Active Problems:   PAF, recurrance this admission, on chronic Amio/ Coumadin   COPD with emphysema   Chronic respiratory failure with hypoxia   HTN (hypertension)   CAD, remote RCA PCI, subsequently noted to be occluded, last cath 8/11   DM type 2 causing CKD stage 3   Hip fracture   Preop cardiovascular exam   Hip fracture requiring operative repair    Time spent: 42 minutes   [facility] Pager [phone]. If 7PM-7AM, please contact night-coverage at [email or web address], password [facility] 01/20/2015, 10:19 AM  LOS: 7 days

## 2015-01-20 NOTE — Progress Notes (Signed)
ANTICOAGULATION CONSULT NOTE - Follow Up Consult  Pharmacy Consult for Coumadin Indication: atrial fibrillation  Patient Measurements: Height: 5\' 1"  (154.9 cm) Weight: 144 lb 13.5 oz (65.7 kg) IBW/kg (Calculated) : 47.8  Labs:  Recent Labs  01/18/15 0435 01/18/15 0955 01/19/15 0325 01/20/15 0519  HGB 8.9*  --  9.1*  --   HCT 27.9*  --  28.5*  --   PLT 241  --  239  --   LABPROT 28.8*  --  32.7* 33.3*  INR 2.77*  --  3.28* 3.35*  CREATININE  --  0.95 0.86 0.91    Estimated Creatinine Clearance: 40 mL/min (by C-G formula based on Cr of 0.91).  Assessment:   On Coumadin for afib prior to admission.   Home regimen: 5 mg daily except none on Saturdays. Note from PharmD at [facility] from 7/11 had INR of 1.2.    INR 4.23 on admit 01/13/15. Reversed INR for surgery 7/21.    Coumadin resumed post-op 7/21 pm.  Quick rise in INR, into supratherapeutic range on 7/25 and dose held. INR up a bit more today at 3.35.  No bleeding noted.  Goal of Therapy:  INR 2-3 Monitor platelets by anticoagulation protocol: Yes   Plan:   No Coumadin again today.  Daily PT/INR.  Arty Baumgartner,  Pager: [phone] 01/20/2015,2:08 PM

## 2015-01-20 NOTE — Care Management Note (Signed)
Case Management Note  Patient Details  Name: [name] MRN: [id number] Date of Birth: Jun 11, 1928  Subjective/Objective:         Patient will be going to [facility] at discharge, SNF, CSW following.             Action/Plan:   Expected Discharge Date:                  Expected Discharge Plan:  Skilled Nursing Facility  In-House Referral:  Clinical Social Work  Discharge planning Services  CM Consult  Post Acute Care Choice:    Choice offered to:     DME Arranged:    DME Agency:     HH Arranged:    [place] Agency:     Status of Service:  Completed, signed off  Medicare Important Message Given:  Yes-third notification given Date Medicare IM Given:    Medicare IM give by:    Date Additional Medicare IM Given:    Additional Medicare Important Message give by:     If discussed at [place] of Stay Meetings, dates discussed:    Additional Comments:  Zenon Mayo, RN 01/20/2015, 12:15 PM

## 2015-01-20 NOTE — Progress Notes (Addendum)
Nutrition Follow-up  DOCUMENTATION CODES:   Not applicable  INTERVENTION:   -Continue with Glucerna Shake po BID, each supplement provides 220 kcal and 10 grams of protein  NUTRITION DIAGNOSIS:   Increased nutrient needs related to chronic illness (hip fracture) as evidenced by estimated needs.  Ongoing  GOAL:   Patient will meet greater than or equal to 90% of their needs  Unmet  MONITOR:   PO intake, Supplement acceptance, Labs, Weight trends, I & O's  REASON FOR ASSESSMENT:   Consult Hip fracture protocol  ASSESSMENT:   81 y.o. Female with a history of CAD, CHF, PVD, DM2, COPD on home O2, anxiety, former smoker and DM who was admitted to [facility] on 7/14 for headaches and neck pain. Head CT showed old lacunar infarct. She became confused/ delirium in the hospital. She had a fell 7/18 and broke her L hip (intertrochanteric fx). She was transferred to [facility] for ortho eval and surgery.  S/p Procedure(s) on 01/15/15: INTRAMEDULLARY (IM) NAIL FEMORAL LEFT (Left)  Pt in with therapy at time of visit.   Reviewed SLP noted from 01/20/15. Pt was upgraded from a dysphagia 1 diet with nectar thick liquids to a dysphagia 2 diet with thin liquids. She was just advanced to a regular consistency diet this AM. Intake is very poor (0-10%). Pt is refusing glucerna shakes per MAR.   Per chart review, pt is very agitated, which is likely contributing to poor po intake. Suspect intake will improve with diet advancement and resolution of symptoms.   CSW following. Plan is to d/c to [facility] once medically stable.   Diet Order:  Diet regular Room service appropriate?: Yes; Fluid consistency:: Thin  Skin:  Reviewed, no issues (closed lt hip incision)  Last BM:  01/19/15  Height:   Ht Readings from Last 1 Encounters:  01/15/15 5\' 1"  (1.549 m)    Weight:   Wt Readings from Last 1 Encounters:  01/20/15 144 lb 13.5 oz (65.7 kg)    Ideal Body Weight:  47.7 kg  Wt  Readings from Last 10 Encounters:  01/20/15 144 lb 13.5 oz (65.7 kg)  01/05/15 145 lb 8 oz (65.998 kg)  11/04/14 144 lb (65.318 kg)  07/22/14 143 lb 12.8 oz (65.227 kg)  06/12/14 148 lb (67.132 kg)  06/10/14 145 lb 6.4 oz (65.953 kg)  01/28/14 149 lb (67.586 kg)  01/10/14 149 lb (67.586 kg)  01/07/14 152 lb 3.2 oz (69.037 kg)  01/03/14 153 lb (69.4 kg)    BMI:  Body mass index is 27.38 kg/(m^2).  Estimated Nutritional Needs:   Kcal:  1500-1700  Protein:  75-85 gm  Fluid:  1.5-1.7 L  EDUCATION NEEDS:   No education needs identified at this time  Marcianna Daily A. Jimmye Norman, RD, LDN, CDE Pager: [phone] After hours Pager: [phone]

## 2015-01-20 NOTE — Progress Notes (Addendum)
Occupational Therapy Treatment Patient Details Name: [name] MRN: [id number] DOB: 03/19/1928 Today's Date: 01/20/2015    History of present illness Pt admitted to [facility]/14 with UTI, fall 7/18 with Left intertrochanteric femur fx, transfer to [facility] with IM nail. PMHx: CAD, CHF, AFib, legally blind   OT comments  Pt progressing and able to sit EOB and perform grooming tasks. Continue to recommend SNF for rehab.   Follow Up Recommendations  SNF;Supervision/Assistance - 24 hour    Equipment Recommendations  Other (comment) (defer to next venue)    Recommendations for Other Services      Precautions / Restrictions Precautions Precautions: Fall Restrictions Weight Bearing Restrictions: Yes LLE Weight Bearing: Partial weight bearing LLE Partial Weight Bearing Percentage or Pounds: 25%       Mobility Bed Mobility Overal bed mobility: Needs Assistance Bed Mobility: Supine to Sit     Supine to sit: Max assist Sit to supine: Mod assist   General bed mobility comments: cues for technique. Assist with hips/trunk to get out of bed and assist to move LLE. Assist to return to bed and to scoot HOB.                   Balance Overall balance assessment: Needs assistance     Min assist for balance (pt leaning to right/feet supported) at times, sitting EOB during grooming.                                 ADL Overall ADL's : Needs assistance/impaired     Grooming: Wash/dry face;Brushing hair;Oral care;Minimal assistance (applied chapstick) Grooming Details (indicate cue type and reason): assist for balance; handed her items                                      Vision                     Perception     Praxis      Cognition  Awake/Alert Behavior During Therapy: WFL for tasks assessed/performed Overall Cognitive Status: No family/caregiver present to determine baseline cognitive functioning Area of Impairment:  Orientation;Problem solving Orientation Level: Disoriented to;Time (unsure of date)            Problem Solving: Slow processing;Requires verbal cues;Requires tactile cues      Extremity/Trunk Assessment               Exercises     Shoulder Instructions       General Comments      Pertinent Vitals/ Pain       Pain Assessment: 0-10 Pain Score: 8  Pain Location: neck/bilateral shoulder areas; head Pain Descriptors / Indicators: Headache;Throbbing Pain Intervention(s): Monitored during session;Repositioned;Patient requesting pain meds-RN notified   Pt on O2 in session; O2 monitor in room reading in 70s at times (unsure of accuracy)-checked on OT's pulse oximeter and O2 in 80s-90s. Home Living                                          Prior Functioning/Environment              Frequency Min 2X/week     Progress Toward Goals  OT Goals(current goals can now be found  in the care plan section)  Progress towards OT goals: Progressing toward goals  Acute Rehab OT Goals Patient Stated Goal: not stated OT Goal Formulation: With patient Time For Goal Achievement: 01/30/15 Potential to Achieve Goals: Good ADL Goals Pt Will Perform Eating: with supervision;sitting;bed level Pt Will Perform Grooming: with min assist;sitting Pt Will Transfer to Toilet: with mod assist;stand pivot transfer;bedside commode Pt/caregiver will Perform Home Exercise Program: Increased strength;Both right and left upper extremity;With minimal assist (AROM) Additional ADL Goal #1: Pt will perform bed mobility with mod assist in preparation for ADL. Additional ADL Goal #2: Pt will demonstrate selective attention during ADL.  Plan Discharge plan remains appropriate    Co-evaluation                 End of Session Equipment Utilized During Treatment: Oxygen   Activity Tolerance Patient tolerated treatment well   Patient Left in bed;with call bell/phone within  reach;with bed alarm set; SCDs reapplied   Nurse Communication Other (comment) (wants a sprite; tech getting her on bedpan-OT spoke with tech about this; OT noted pt had fluids on bed pads; pt wants pain meds; O2 sats)        Time: 3532-9924 OT Time Calculation (min): 20 min  Charges: OT General Charges $OT Visit: 1 Procedure OT Treatments $Self Care/Home Management : 8-22 mins  Benito Mccreedy OTR/L [phone] 01/20/2015, 11:38 AM

## 2015-01-20 NOTE — Progress Notes (Signed)
   Subjective: 5 Days Post-Op Procedure(s) (LRB): INTRAMEDULLARY (IM) NAIL FEMORAL LEFT  (Left)  Pt awake but disoriented today Minimal pain to left hip when prompted Noted therapy notes from earlier today Patient reports pain as mild.  Objective:   VITALS:   Filed Vitals:   01/20/15 1302  BP: 148/69  Pulse: 69  Temp: 98.2 F (36.8 C)  Resp: 18    Left hip incisions healing well nv intact distally Mild soreness with rom  LABS  Recent Labs  01/18/15 0435 01/19/15 0325  HGB 8.9* 9.1*  HCT 27.9* 28.5*  WBC 10.1 10.0  PLT 241 239     Recent Labs  01/18/15 0955 01/19/15 0325 01/20/15 0519  NA 141 141 142  K 3.8 3.3* 3.6  BUN 20 16 16   CREATININE 0.95 0.86 0.91  GLUCOSE 146* 134* 129*     Assessment/Plan: 5 Days Post-Op Procedure(s) (LRB): INTRAMEDULLARY (IM) NAIL FEMORAL LEFT  (Left) Continue PT/OT Pain control as needed Agree with SNF placement    Brad Bo Teicher, MPAS, PA-C  01/20/2015, 3:12 PM

## 2015-01-20 NOTE — Progress Notes (Signed)
  Echocardiogram 2D Echocardiogram has been performed.  Darlina Sicilian M 01/20/2015, 4:51 PM

## 2015-01-20 NOTE — Clinical Social Work Note (Signed)
Patient still has bed with [facility] SNF once stable for DC. Report left for covering CSW.  Liz Beach MSW, [place], [place], [phone]

## 2015-01-21 DIAGNOSIS — M542 Cervicalgia: Secondary | ICD-10-CM | POA: Insufficient documentation

## 2015-01-21 DIAGNOSIS — D62 Acute posthemorrhagic anemia: Secondary | ICD-10-CM | POA: Insufficient documentation

## 2015-01-21 DIAGNOSIS — S72001D Fracture of unspecified part of neck of right femur, subsequent encounter for closed fracture with routine healing: Secondary | ICD-10-CM

## 2015-01-21 DIAGNOSIS — I10 Essential (primary) hypertension: Secondary | ICD-10-CM

## 2015-01-21 DIAGNOSIS — J439 Emphysema, unspecified: Secondary | ICD-10-CM

## 2015-01-21 DIAGNOSIS — Z7901 Long term (current) use of anticoagulants: Secondary | ICD-10-CM

## 2015-01-21 DIAGNOSIS — S72142A Displaced intertrochanteric fracture of left femur, initial encounter for closed fracture: Principal | ICD-10-CM

## 2015-01-21 LAB — BASIC METABOLIC PANEL
Anion gap: 7 (ref 5–15)
BUN: 18 mg/dL (ref 6–20)
CO2: 33 mmol/L — AB (ref 22–32)
Calcium: 8.6 mg/dL — ABNORMAL LOW (ref 8.9–10.3)
Chloride: 101 mmol/L (ref 101–111)
Creatinine, Ser: 1.06 mg/dL — ABNORMAL HIGH (ref 0.44–1.00)
GFR calc Af Amer: 54 mL/min — ABNORMAL LOW (ref >60)
GFR calc non Af Amer: 47 mL/min — ABNORMAL LOW (ref >60)
Glucose, Bld: 124 mg/dL — ABNORMAL HIGH (ref 65–99)
POTASSIUM: 3.6 mmol/L (ref 3.5–5.1)
SODIUM: 141 mmol/L (ref 135–145)

## 2015-01-21 LAB — CBC
HEMATOCRIT: 30 % — AB (ref 36.0–46.0)
Hemoglobin: 9.4 g/dL — ABNORMAL LOW (ref 12.0–15.0)
MCH: 31 pg (ref 26.0–34.0)
MCHC: 31.3 g/dL (ref 30.0–36.0)
MCV: 99 fL (ref 78.0–100.0)
Platelets: 316 10*3/uL (ref 150–400)
RBC: 3.03 MIL/uL — ABNORMAL LOW (ref 3.87–5.11)
RDW: 14.2 % (ref 11.5–15.5)
WBC: 9.9 10*3/uL (ref 4.0–10.5)

## 2015-01-21 LAB — GLUCOSE, CAPILLARY
Glucose-Capillary: 93 mg/dL (ref 65–99)
Glucose-Capillary: 166 mg/dL — ABNORMAL HIGH (ref 65–99)
Glucose-Capillary: 110 mg/dL — ABNORMAL HIGH (ref 65–99)
Glucose-Capillary: 192 mg/dL — ABNORMAL HIGH (ref 65–99)

## 2015-01-21 LAB — MAGNESIUM: Magnesium: 1.9 mg/dL (ref 1.7–2.4)

## 2015-01-21 LAB — PROTIME-INR
INR: 3.04 — AB (ref 0.00–1.49)
PROTHROMBIN TIME: 30.9 s — AB (ref 11.6–15.2)

## 2015-01-21 MED ORDER — WARFARIN SODIUM 1 MG PO TABS
1.0000 mg | ORAL_TABLET | Freq: Once | ORAL | Status: AC
  Administered 2015-01-21: 1 mg via ORAL
  Filled 2015-01-21: qty 1

## 2015-01-21 MED ORDER — FUROSEMIDE 20 MG PO TABS: 20.0000 mg | ORAL_TABLET | Freq: Two times a day (BID) | ORAL | Status: DC

## 2015-01-21 MED ORDER — FUROSEMIDE 10 MG/ML IJ SOLN
20.0000 mg | Freq: Two times a day (BID) | INTRAMUSCULAR | Status: DC
  Administered 2015-01-21: 20 mg via INTRAVENOUS

## 2015-01-21 NOTE — Care Management Important Message (Signed)
Important Message  Patient Details  Name: Kalyse Meharg MRN: [id number] Date of Birth: [birth year]   Medicare Important Message Given:  Yes-fourth notification given    Nathen May 01/21/2015, 12:16 [place] Message  Patient Details  Name: Laycie Schriner MRN: [id number] Date of Birth: September 21, 1927   Medicare Important Message Given:  Yes-fourth notification given    Nathen May 01/21/2015, 12:16 PM

## 2015-01-21 NOTE — Progress Notes (Addendum)
CSW (Clinical Education officer, museum) notified that pt potentially ready for dc today. CSW notified facility and also called pt daughter Santiago Glad to update. CSW also text paged MD to notify bed is available if pt is medically stable.  [place], [place]

## 2015-01-21 NOTE — Progress Notes (Signed)
ANTICOAGULATION CONSULT NOTE - Follow Up Consult  Pharmacy Consult for Coumadin Indication: atrial fibrillation  Patient Measurements: Height: 5\' 1"  (154.9 cm) Weight: 144 lb 13.5 oz (65.7 kg) IBW/kg (Calculated) : 47.8  Labs:  Recent Labs  01/19/15 0325 01/20/15 0519 01/21/15 0543  HGB 9.1*  --   --   HCT 28.5*  --   --   PLT 239  --   --   LABPROT 32.7* 33.3* 30.9*  INR 3.28* 3.35* 3.04*  CREATININE 0.86 0.91  --     Estimated Creatinine Clearance: 40 mL/min (by C-G formula based on Cr of 0.91).  Assessment:   On Coumadin for afib prior to admission.   Home regimen: 5 mg daily except none on Saturdays. Note from PharmD at [facility] from 7/11 had INR of 1.2, but some prior INRs had been within target range on same regimen.   INR 4.23 on admit 01/13/15. Reversed INR for surgery 7/21.    Coumadin resumed post-op 7/21 pm.  Quick rise in INR, into supratherapeutic range on 7/25 and doses held x 2 days. INR is just above therapeutic range today at 3.04. Expect further decline by am. No bleeding noted.  Goal of Therapy:  INR 2-3 Monitor platelets by anticoagulation protocol: Yes   Plan:   Small dose of Coumadin 1 mg today.  Daily PT/INR; CBC in am.   Arty Baumgartner, [place] Pager: [phone] 01/21/2015,10:19 AM

## 2015-01-21 NOTE — Progress Notes (Signed)
Physical Therapy Treatment Patient Details Name: [name] MRN: [id number] DOB: 11-25-1927 Today's Date: 01/21/2015    History of Present Illness Pt admitted to [facility]/14 with UTI, fall 7/18 with Left intertrochanteric femur fx, transfer to [facility] with IM nail. PMHx: CAD, CHF, AFib, legally blind    PT Comments    Pt progressing slowly towards physical therapy goals. Was able to perform transfers with +2 assist, however did not appear to be able to maintain PWB status well in standing. Will continue to follow and progress as able per POC.   Follow Up Recommendations  SNF;Supervision/Assistance - 24 hour     Equipment Recommendations  None recommended by PT    Recommendations for Other Services       Precautions / Restrictions Precautions Precautions: Fall Restrictions Weight Bearing Restrictions: Yes LLE Weight Bearing: Partial weight bearing LLE Partial Weight Bearing Percentage or Pounds: 25%    Mobility  Bed Mobility Overal bed mobility: Needs Assistance Bed Mobility: Supine to Sit     Supine to sit: Mod assist;+2 for physical assistance     General bed mobility comments: VC's for sequencing and technique. Assist for LE off EOB as well as for trunk elevation to full sitting position.   Transfers Overall transfer level: Needs assistance Equipment used: 2 person hand held assist Transfers: Sit to/from [facility] Sit to Stand: Mod assist;+2 physical assistance Stand pivot transfers: Mod assist;+2 physical assistance       General transfer comment: Pt was able to stand and take a few pivotal steps around to the chair. +2 assist required for safety and support. Pt was reminded of PWB status, however do not feel she was maintaining throughout transfer.   Ambulation/Gait                 Stairs            Wheelchair Mobility    Modified Rankin (Stroke Patients Only)       Balance Overall balance assessment: Needs  assistance Sitting-balance support: Feet supported;Bilateral upper extremity supported Sitting balance-Leahy Scale: Poor     Standing balance support: Bilateral upper extremity supported;During functional activity Standing balance-Leahy Scale: Poor                      Cognition Arousal/Alertness: Awake/alert Behavior During Therapy: WFL for tasks assessed/performed Overall Cognitive Status: History of cognitive impairments - at baseline       Memory: Decreased short-term memory Following Commands: Follows one step commands consistently;Follows one step commands with increased time Safety/Judgement: Decreased awareness of deficits;Decreased awareness of safety     General Comments: Pt admits to having difficulty with memory and staying on task at times.     Exercises      General Comments        Pertinent Vitals/Pain Pain Assessment: Faces Faces Pain Scale: Hurts little more Pain Location: L hip with movement Pain Descriptors / Indicators: Aching Pain Intervention(s): Limited activity within patient's tolerance;Monitored during session;Repositioned    Home Living                      Prior Function            PT Goals (current goals can now be found in the care plan section) Acute Rehab PT Goals Patient Stated Goal: not stated PT Goal Formulation: With family Time For Goal Achievement: 01/30/15 Potential to Achieve Goals: Fair Progress towards PT goals: Progressing toward goals  Frequency  Min 3X/week    PT Plan Current plan remains appropriate    Co-evaluation             End of Session Equipment Utilized During Treatment: Gait belt;Oxygen Activity Tolerance: Patient tolerated treatment well Patient left: in chair;with call bell/phone within reach;with chair alarm set;with family/visitor present     Time: 4709-2957 PT Time Calculation (min) (ACUTE ONLY): 29 min  Charges:  $Therapeutic Activity: 23-37 mins                     G Codes:      Rolinda Roan 2015/01/30, 12:06 PM   Rolinda Roan, PT, DPT Acute Rehabilitation Services Pager: [phone]

## 2015-01-21 NOTE — Progress Notes (Signed)
[facility] PROGRESS NOTE  [name] IRW:[id number] DOB: Oct 24, 1927 DOA: 01/13/2015 PCP: Redge Gainer, MD  Assessment/Plan: #1 acute hypoxemic per copy respiratory failure Multifactorial in nature secondary to stage IV COPD and acute on chronic diastolic CHF exacerbation. Patient with clinical improvement. 2-D echo 01/20/2015 with EF= 45-50% with no wall motion abnormalities. Patient diuresed well. Patient is -4.7 L during this hospitalization. Continue gentle diuresis with IV Lasix. Keep O2 between 89-94%. Continue Symbicort, Lopressor.  #2 left intertrochanteric femur fracture/neck pain Secondary to mechanical fall. Patient is status post IM nail of the left intertrochanteric fracture 01/15/2015. Per orthopedics. Patient may use heating pad for neck pain. CT head and neck was negative for intracranial bleed or fracture. MRI of the C-spine showed facet arthropathy. No ligamentous disruption. BenGay as needed.  #3 elevated troponin Likely secondary to demand ischemia in the setting of anemia and hypotension. Cardiology signed off felt patient was stable from a cardiac standpoint. Troponin plateaued. 2-D echo with EF 45-50%. Continue aspirin.  #4 acute on chronic diastolic CHF  Unknown etiology. Patient with clinical improvement. 2-D echo from 01/20/2015 with an ejection fraction of 45-50% with no wall motion abnormalities. Patient is -4.7 L during this hospitalization. Continue gentle diuresis with IV Lasix. Follow.  #5 hypokalemia Secondary to diureses. Repleted.  #6 history of coronary artery disease/paroxysmal atrial fibrillation/peripherovascular disease Patient was cleared by cardiology for surgery. EKG showed a first-degree block and right bundle branch block. Patient was noted to have a mild increase in troponin felt to be secondary to demand ischemia in the center of anemia and hypotension. 2-D echo with ejection fraction 45-50% with no warm wash and abnormalities. Medical  management. Continue amiodarone and Lopressor for rate control for atrial fibrillation.Imdur and nifedipine on hold secondary to soft blood pressure.  #7 postop acute blood loss anemia Status post 1 unit packed red blood cell 01/16/2015. Hemoglobin currently stable at 9.4. Follow.  #8 atrial fibrillation CHAS2VASC= 5. Continue amiodarone and Lopressor for rate control. Patient's INR was supratherapeutic on admission and had to be reversed with vitamin K and FFP. INR still supratherapeutic at 3.04. Coumadin per pharmacy.  #9 hypertension BP stable. Imdur and nifedipine on hold secondary to self blood pressure. Beta blocker has been resumed. Follow.  #10 acute delirium Result. Back to baseline.  #11 COPD Stable. On home O2. Continue Symbicort, nebulizer treatments.  #12 prophylaxis INR is supratherapeutic for DVT prophylaxis.   Code Status: Full Family Communication: Updated patient and daughter at bedside. Disposition Plan: Back to skilled nursing facility once respiratory status is back to baseline.   Consultants:  Orthopedics: Dr. Wynelle Link 01/13/2015  Cardiology: Dr. Stanford Breed 01/14/2015  Procedures:  CT C-spine CT head 01/14/2015  Chest x-ray 01/13/2015, 01/15/2015, 01/17/2015  MRI C-spine 01/14/2015  2-D echo 01/20/2015  IM Nail left IT fracture 01/15/15 Dr Veverly Fells  2 units FFP 01/15/2015  1 unit packed red blood cells 01/16/2015  Antibiotics:  None  HPI/Subjective: Patient states breathing has improved. Patient denies any chest pain. Patient states heating pad helped with neck pain.  Objective: Filed Vitals:   01/21/15 1345  BP: 129/92  Pulse: 59  Temp:   Resp: 24    Intake/Output Summary (Last 24 hours) at 01/21/15 1633 Last data filed at 01/21/15 1514  Gross per 24 hour  Intake    520 ml  Output   1100 ml  Net   -580 ml   Filed Weights   01/15/15 2020 01/19/15 2318 01/20/15 0606  Weight: 66.8 kg (147  lb 4.3 oz) 67.4 kg (148 lb 9.4 oz) 65.7 kg  (144 lb 13.5 oz)    Exam:   General:  nad  Cardiovascular: RRR  Respiratory: Bibasilar crackles. No wheezing.  Abdomen: Soft, nontender, nondistended, positive bowel sounds.  Musculoskeletal: No clubbing cyanosis or edema.  Data Reviewed: Basic Metabolic Panel:  Recent Labs Lab 01/17/15 1115 01/18/15 0955 01/19/15 0325 01/20/15 0519 01/21/15 0912  NA 137 141 141 142 141  K 3.8 3.8 3.3* 3.6 3.6  CL 97* 101 98* 100* 101  CO2 31 32 35* 34* 33*  GLUCOSE 216* 146* 134* 129* 124*  BUN 27* 20 16 16 18   CREATININE 1.31* 0.95 0.86 0.91 1.06*  CALCIUM 8.5* 8.9 8.6* 8.2* 8.6*  MG  --   --   --   --  1.9   Liver Function Tests:  Recent Labs Lab 01/15/15 0400 01/16/15 0302 01/17/15 1115 01/18/15 0955  AST 17 22 21 22   ALT 16 16 6* 9*  ALKPHOS 64 63 59 70  BILITOT 0.8 0.7 0.7 1.2  PROT 6.3* 6.3* 5.7* 6.7  ALBUMIN 2.5* 2.7* 2.1* 2.3*   No results for input(s): LIPASE, AMYLASE in the last 168 hours. No results for input(s): AMMONIA in the last 168 hours. CBC:  Recent Labs Lab 01/16/15 1710 01/17/15 0130 01/17/15 0445 01/18/15 0435 01/19/15 0325 01/21/15 0912  WBC 8.2  --  9.5 10.1 10.0 9.9  HGB 7.4* 8.6* 8.8* 8.9* 9.1* 9.4*  HCT 23.5* 27.0* 27.8* 27.9* 28.5* 30.0*  MCV 96.7  --  94.9 94.9 96.9 99.0  PLT 200  --  211 241 239 316   Cardiac Enzymes:  Recent Labs Lab 01/16/15 1832 01/17/15 0130 01/17/15 0445  TROPONINI 0.27* 0.29* 0.30*   BNP (last 3 results) No results for input(s): BNP in the last 8760 hours.  ProBNP (last 3 results) No results for input(s): PROBNP in the last 8760 hours.  CBG:  Recent Labs Lab 01/20/15 1129 01/20/15 1642 01/20/15 2206 01/21/15 0808 01/21/15 1153  GLUCAP 120* 118* 121* 93 166*    Recent Results (from the past 240 hour(s))  Surgical pcr screen     Status: None   Collection Time: 01/15/15  6:10 AM  Result Value Ref Range Status   MRSA, PCR NEGATIVE NEGATIVE Final   Staphylococcus aureus NEGATIVE  NEGATIVE Final    Comment:        The Xpert SA Assay (FDA approved for NASAL specimens in patients over 74 years of age), is one component of a comprehensive surveillance program.  Test performance has been validated by [facility] for patients greater than or equal to 31 year old. It is not intended to diagnose infection nor to guide or monitor treatment.   MRSA PCR Screening     Status: None   Collection Time: 01/15/15  8:56 PM  Result Value Ref Range Status   MRSA by PCR NEGATIVE NEGATIVE Final    Comment:        The GeneXpert MRSA Assay (FDA approved for NASAL specimens only), is one component of a comprehensive MRSA colonization surveillance program. It is not intended to diagnose MRSA infection nor to guide or monitor treatment for MRSA infections.      Studies: No results found.  Scheduled Meds: . amiodarone  100 mg Oral Daily  . aspirin  81 mg Oral Daily  . atorvastatin  40 mg Oral q1800  . budesonide-formoterol  1 puff Inhalation BID  . cholecalciferol  1,000 Units Oral Daily  .  feeding supplement (GLUCERNA SHAKE)  237 mL Oral BID BM  . ferrous sulfate  325 mg Oral TID PC  . fluticasone  1 spray Each Nare BID  . furosemide  20 mg Intravenous Q12H  . insulin aspart  0-5 Units Subcutaneous QHS  . insulin aspart  0-9 Units Subcutaneous TID WC  . insulin detemir  8 Units Subcutaneous q morning - 10a  . LORazepam  0.5 mg Intravenous Once  . metoprolol tartrate  12.5 mg Oral BID  . pantoprazole  40 mg Oral BID  . potassium chloride SA  40 mEq Oral Daily  . senna-docusate  1 tablet Oral BID  . vitamin C  500 mg Oral QHS  . warfarin  1 mg Oral ONCE-1800  . Warfarin - Pharmacist Dosing Inpatient   Does not apply q1800   Continuous Infusions: . lactated ringers 10 mL/hr at 01/15/15 1409    Principal Problem:   Hip fracture requiring operative repair Active Problems:   PAF, recurrance this admission, on chronic Amio/ Coumadin   COPD with emphysema    Chronic respiratory failure with hypoxia   HTN (hypertension)   CAD, remote RCA PCI, subsequently noted to be occluded, last cath 8/11   DM type 2 causing CKD stage 3   Hip fracture   Preop cardiovascular exam    Time spent: 55 minutes    THOMPSON,DANIEL M.D. [facility] Pager [phone]. If 7PM-7AM, please contact night-coverage at [email or web address], password [facility] 01/21/2015, 4:33 PM  LOS: 8 days

## 2015-01-22 ENCOUNTER — Encounter: Payer: Self-pay | Admitting: *Deleted

## 2015-01-22 DIAGNOSIS — S72002A Fracture of unspecified part of neck of left femur, initial encounter for closed fracture: Secondary | ICD-10-CM

## 2015-01-22 DIAGNOSIS — E1122 Type 2 diabetes mellitus with diabetic chronic kidney disease: Secondary | ICD-10-CM

## 2015-01-22 DIAGNOSIS — N183 Chronic kidney disease, stage 3 (moderate): Secondary | ICD-10-CM

## 2015-01-22 DIAGNOSIS — S72002D Fracture of unspecified part of neck of left femur, subsequent encounter for closed fracture with routine healing: Secondary | ICD-10-CM

## 2015-01-22 LAB — BASIC METABOLIC PANEL
ANION GAP: 8 (ref 5–15)
BUN: 20 mg/dL (ref 6–20)
CO2: 30 mmol/L (ref 22–32)
Calcium: 8.7 mg/dL — ABNORMAL LOW (ref 8.9–10.3)
Chloride: 102 mmol/L (ref 101–111)
Creatinine, Ser: 1.09 mg/dL — ABNORMAL HIGH (ref 0.44–1.00)
GFR calc non Af Amer: 45 mL/min — ABNORMAL LOW (ref >60)
GFR, EST AFRICAN AMERICAN: 52 mL/min — AB (ref >60)
Glucose, Bld: 129 mg/dL — ABNORMAL HIGH (ref 65–99)
POTASSIUM: 4.4 mmol/L (ref 3.5–5.1)
Sodium: 140 mmol/L (ref 135–145)

## 2015-01-22 LAB — GLUCOSE, CAPILLARY
GLUCOSE-CAPILLARY: 110 mg/dL — AB (ref 65–99)
GLUCOSE-CAPILLARY: 119 mg/dL — AB (ref 65–99)
GLUCOSE-CAPILLARY: 224 mg/dL — AB (ref 65–99)
GLUCOSE-CAPILLARY: 62 mg/dL — AB (ref 65–99)
GLUCOSE-CAPILLARY: 98 mg/dL (ref 65–99)

## 2015-01-22 LAB — CBC
HCT: 28.6 % — ABNORMAL LOW (ref 36.0–46.0)
Hemoglobin: 8.9 g/dL — ABNORMAL LOW (ref 12.0–15.0)
MCH: 30.7 pg (ref 26.0–34.0)
MCHC: 31.1 g/dL (ref 30.0–36.0)
MCV: 98.6 fL (ref 78.0–100.0)
Platelets: 343 10*3/uL (ref 150–400)
RBC: 2.9 MIL/uL — ABNORMAL LOW (ref 3.87–5.11)
RDW: 14.1 % (ref 11.5–15.5)
WBC: 9.7 10*3/uL (ref 4.0–10.5)

## 2015-01-22 LAB — PROTIME-INR
INR: 3.43 — ABNORMAL HIGH (ref 0.00–1.49)
Prothrombin Time: 33.9 seconds — ABNORMAL HIGH (ref 11.6–15.2)

## 2015-01-22 MED ORDER — FUROSEMIDE 40 MG PO TABS
40.0000 mg | ORAL_TABLET | Freq: Every day | ORAL | Status: DC
  Administered 2015-01-22 – 2015-01-23 (×2): 40 mg via ORAL
  Filled 2015-01-22 (×2): qty 1

## 2015-01-22 NOTE — Progress Notes (Signed)
Attempts to ween patient's 02. Patient refusing continuous pulse 0x after education. Patient will pull it off finger. Sp02 90% on 5L.

## 2015-01-22 NOTE — Progress Notes (Signed)
   Subjective: 7 Days Post-Op Procedure(s) (LRB): INTRAMEDULLARY (IM) NAIL FEMORAL LEFT  (Left)  Pt alert but still disoriented Minimal pain to left hip and thigh Noted cardiology and internal medicine notes about health status Patient reports pain as mild.  Objective:   VITALS:   Filed Vitals:   01/22/15 0559  BP: 153/50  Pulse: 58  Temp: 98.7 F (37.1 C)  Resp: 18    Left hip incisions healing well nv intact distally No rashes  Trace edema distally  LABS  Recent Labs  01/21/15 0912 01/22/15 0528  HGB 9.4* 8.9*  HCT 30.0* 28.6*  WBC 9.9 9.7  PLT 316 343     Recent Labs  01/20/15 0519 01/21/15 0912 01/22/15 0528  NA 142 141 140  K 3.6 3.6 4.4  BUN 16 18 20   CREATININE 0.91 1.06* 1.09*  GLUCOSE 129* 124* 129*     Assessment/Plan: 7 Days Post-Op Procedure(s) (LRB): INTRAMEDULLARY (IM) NAIL FEMORAL LEFT  (Left) Continue OT as able D/c planning Pain management Plan for f/u 2 weeks after discharge once medically stable    Brad Dixon, MPAS, PA-C  01/22/2015, 8:24 AM

## 2015-01-22 NOTE — Progress Notes (Signed)
ANTICOAGULATION CONSULT NOTE - Follow Up Consult  Pharmacy Consult for Coumadin Indication: atrial fibrillation  Patient Measurements: Height: 5\' 1"  (154.9 cm) Weight: 144 lb 13.5 oz (65.7 kg) IBW/kg (Calculated) : 47.8  Labs:  Recent Labs  01/20/15 0519 01/21/15 0543 01/21/15 0912 01/22/15 0528  HGB  --   --  9.4* 8.9*  HCT  --   --  30.0* 28.6*  PLT  --   --  316 343  LABPROT 33.3* 30.9*  --  33.9*  INR 3.35* 3.04*  --  3.43*  CREATININE 0.91  --  1.06* 1.09*    Estimated Creatinine Clearance: 33.4 mL/min (by C-G formula based on Cr of 1.09).  Assessment:   On Coumadin for afib prior to admission.   Home regimen: 5 mg daily except none on Saturdays. Note from PharmD at [facility] from 7/11 had INR of 1.2, but some prior INRs had been within target range on same regimen.   INR 4.23 on admit 01/13/15. Reversed INR for surgery 7/21.    Coumadin resumed post-op 7/21 pm.  Quick rise in INR, into supratherapeutic range on 7/25 and doses held x 2 days. Small dose of Coumadin 1 mg given on 7/27 when INR 3.04, but INR up to 3.43 today.  Hgb trended down some. On iron TID. No bleeding noted. Patient reports occasional nosebleeds at home, but none here.  Goal of Therapy:  INR 2-3 Monitor platelets by anticoagulation protocol: Yes   Plan:   No Coumadin today.  Daily PT/INR.  Follow up for any bleeding.  Arty Baumgartner, [place] Pager: [phone] 01/22/2015,11:42 AM

## 2015-01-22 NOTE — Progress Notes (Signed)
[facility] PROGRESS NOTE  [name] NUU:[id number] DOB: Dec 07, 1927 DOA: 01/13/2015 PCP: Redge Gainer, MD  Assessment/Plan: #1 acute hypoxemic respiratory failure Multifactorial in nature secondary to stage IV COPD and acute on chronic diastolic CHF exacerbation. Patient with clinical improvement. 2-D echo 01/20/2015 with EF= 45-50% with no wall motion abnormalities. Patient diuresed well. Patient is -5.3 L during this hospitalization. Change IV Lasix to oral Lasix. Keep O2 between 89-94%. Continue Symbicort, Lopressor.  #2 left intertrochanteric femur fracture/neck pain Secondary to mechanical fall. Patient is status post IM nail of the left intertrochanteric fracture 01/15/2015. Per orthopedics. Patient may use heating pad for neck pain. CT head and neck was negative for intracranial bleed or fracture. MRI of the C-spine showed facet arthropathy. No ligamentous disruption. BenGay as needed.  #3 elevated troponin Likely secondary to demand ischemia in the setting of anemia and hypotension. Cardiology signed off felt patient was stable from a cardiac standpoint. Troponin plateaued. 2-D echo with EF 45-50%. Continue aspirin.  #4 acute on chronic diastolic CHF  Unknown etiology. Patient with clinical improvement. 2-D echo from 01/20/2015 with an ejection fraction of 45-50% with no wall motion abnormalities. Patient is -5.3 L during this hospitalization. Change IV Lasix to oral home dose Lasix. Follow.  #5 hypokalemia Secondary to diureses. Repleted.  #6 history of coronary artery disease/paroxysmal atrial fibrillation/peripherovascular disease Patient was cleared by cardiology for surgery. EKG showed a first-degree block and right bundle branch block. Patient was noted to have a mild increase in troponin felt to be secondary to demand ischemia in the center of anemia and hypotension. 2-D echo with ejection fraction 45-50% with no warm wash and abnormalities. Medical management. Continue  amiodarone and Lopressor for rate control for atrial fibrillation.Imdur and nifedipine on hold secondary to soft blood pressure.  #7 postop acute blood loss anemia Status post 1 unit packed red blood cell 01/16/2015. Hemoglobin currently stable at 8.9. Follow.  #8 atrial fibrillation CHAS2VASC= 5. Continue amiodarone and Lopressor for rate control. Patient's INR was supratherapeutic on admission and had to be reversed with vitamin K and FFP. INR still supratherapeutic at 3.43. Coumadin per pharmacy. Coumadin on hold.  #9 hypertension BP stable. Imdur and nifedipine on hold secondary to soft blood pressure. Beta blocker has been resumed. Follow.  #10 acute delirium Result. Back to baseline.  #11 COPD Stable. On home O2. Continue Symbicort, nebulizer treatments.  #12 prophylaxis INR is supratherapeutic for DVT prophylaxis.   Code Status: Full Family Communication: Updated patient. No family at bedside. Disposition Plan: Back to skilled nursing facility once respiratory status is back to baseline, hopefully tomorrow.   Consultants:  Orthopedics: Dr. Wynelle Link 01/13/2015  Cardiology: Dr. Stanford Breed 01/14/2015  Procedures:  CT C-spine CT head 01/14/2015  Chest x-ray 01/13/2015, 01/15/2015, 01/17/2015  MRI C-spine 01/14/2015  2-D echo 01/20/2015  IM Nail left IT fracture 01/15/15 Dr Veverly Fells  2 units FFP 01/15/2015  1 unit packed red blood cells 01/16/2015  Antibiotics:  None  HPI/Subjective: Patient states breathing has improved. Patient denies chest pain. Patient states neck pain has improved. Patient asking when she can go home.  Objective: Filed Vitals:   01/22/15 1427  BP: 104/56  Pulse: 57  Temp: 98.7 F (37.1 C)  Resp: 20    Intake/Output Summary (Last 24 hours) at 01/22/15 1450 Last data filed at 01/22/15 3474  Gross per 24 hour  Intake    420 ml  Output    900 ml  Net   -480 ml   [employer]  01/15/15 2020 01/19/15 2318 01/20/15 0606  Weight:  66.8 kg (147 lb 4.3 oz) 67.4 kg (148 lb 9.4 oz) 65.7 kg (144 lb 13.5 oz)    Exam:   General:  nad  Cardiovascular: RRR  Respiratory: mimimal Bibasilar crackles. No wheezing.  Abdomen: Soft, nontender, nondistended, positive bowel sounds.  Musculoskeletal: No clubbing cyanosis or edema.  Data Reviewed: Basic Metabolic Panel:  Recent Labs Lab 01/18/15 0955 01/19/15 0325 01/20/15 0519 01/21/15 0912 01/22/15 0528  NA 141 141 142 141 140  K 3.8 3.3* 3.6 3.6 4.4  CL 101 98* 100* 101 102  CO2 32 35* 34* 33* 30  GLUCOSE 146* 134* 129* 124* 129*  BUN 20 16 16 18 20   CREATININE 0.95 0.86 0.91 1.06* 1.09*  CALCIUM 8.9 8.6* 8.2* 8.6* 8.7*  MG  --   --   --  1.9  --    Liver Function Tests:  Recent Labs Lab 01/16/15 0302 01/17/15 1115 01/18/15 0955  AST 22 21 22   ALT 16 6* 9*  ALKPHOS 63 59 70  BILITOT 0.7 0.7 1.2  PROT 6.3* 5.7* 6.7  ALBUMIN 2.7* 2.1* 2.3*   No results for input(s): LIPASE, AMYLASE in the last 168 hours. No results for input(s): AMMONIA in the last 168 hours. CBC:  Recent Labs Lab 01/17/15 0445 01/18/15 0435 01/19/15 0325 01/21/15 0912 01/22/15 0528  WBC 9.5 10.1 10.0 9.9 9.7  HGB 8.8* 8.9* 9.1* 9.4* 8.9*  HCT 27.8* 27.9* 28.5* 30.0* 28.6*  MCV 94.9 94.9 96.9 99.0 98.6  PLT 211 241 239 316 343   Cardiac Enzymes:  Recent Labs Lab 01/16/15 1832 01/17/15 0130 01/17/15 0445  TROPONINI 0.27* 0.29* 0.30*   BNP (last 3 results) No results for input(s): BNP in the last 8760 hours.  ProBNP (last 3 results) No results for input(s): PROBNP in the last 8760 hours.  CBG:  Recent Labs Lab 01/21/15 1153 01/21/15 1657 01/21/15 2218 01/22/15 0802 01/22/15 1151  GLUCAP 166* 110* 192* 119* 224*    Recent Results (from the past 240 hour(s))  Surgical pcr screen     Status: None   Collection Time: 01/15/15  6:10 AM  Result Value Ref Range Status   MRSA, PCR NEGATIVE NEGATIVE Final   Staphylococcus aureus NEGATIVE NEGATIVE Final     Comment:        The Xpert SA Assay (FDA approved for NASAL specimens in patients over 9 years of age), is one component of a comprehensive surveillance program.  Test performance has been validated by [facility] for patients greater than or equal to 15 year old. It is not intended to diagnose infection nor to guide or monitor treatment.   MRSA PCR Screening     Status: None   Collection Time: 01/15/15  8:56 PM  Result Value Ref Range Status   MRSA by PCR NEGATIVE NEGATIVE Final    Comment:        The GeneXpert MRSA Assay (FDA approved for NASAL specimens only), is one component of a comprehensive MRSA colonization surveillance program. It is not intended to diagnose MRSA infection nor to guide or monitor treatment for MRSA infections.      Studies: No results found.  Scheduled Meds: . amiodarone  100 mg Oral Daily  . aspirin  81 mg Oral Daily  . atorvastatin  40 mg Oral q1800  . budesonide-formoterol  1 puff Inhalation BID  . cholecalciferol  1,000 Units Oral Daily  . feeding supplement (GLUCERNA SHAKE)  237 mL  Oral BID BM  . ferrous sulfate  325 mg Oral TID PC  . fluticasone  1 spray Each Nare BID  . furosemide  40 mg Oral Daily  . insulin aspart  0-5 Units Subcutaneous QHS  . insulin aspart  0-9 Units Subcutaneous TID WC  . insulin detemir  8 Units Subcutaneous q morning - 10a  . LORazepam  0.5 mg Intravenous Once  . metoprolol tartrate  12.5 mg Oral BID  . pantoprazole  40 mg Oral BID  . potassium chloride SA  40 mEq Oral Daily  . senna-docusate  1 tablet Oral BID  . vitamin C  500 mg Oral QHS  . Warfarin - Pharmacist Dosing Inpatient   Does not apply q1800   Continuous Infusions: . lactated ringers 10 mL/hr at 01/15/15 1409    Principal Problem:   Hip fracture requiring operative repair Active Problems:   PAF, recurrance this admission, on chronic Amio/ Coumadin   COPD with emphysema   Chronic respiratory failure with hypoxia   HTN  (hypertension)   CAD, remote RCA PCI, subsequently noted to be occluded, last cath 8/11   DM type 2 causing CKD stage 3   Hip fracture   Preop cardiovascular exam   Intertrochanteric fracture of left hip   Neck pain   Postoperative anemia due to acute blood loss    Time spent: 45 minutes    THOMPSON,DANIEL M.D. [facility] Pager [phone]. If 7PM-7AM, please contact night-coverage at [email or web address], password [facility] 01/22/2015, 2:50 PM  LOS: 9 days

## 2015-01-23 DIAGNOSIS — R1312 Dysphagia, oropharyngeal phase: Secondary | ICD-10-CM | POA: Diagnosis not present

## 2015-01-23 DIAGNOSIS — M6281 Muscle weakness (generalized): Secondary | ICD-10-CM | POA: Diagnosis not present

## 2015-01-23 DIAGNOSIS — I259 Chronic ischemic heart disease, unspecified: Secondary | ICD-10-CM | POA: Diagnosis not present

## 2015-01-23 DIAGNOSIS — K5900 Constipation, unspecified: Secondary | ICD-10-CM | POA: Diagnosis not present

## 2015-01-23 DIAGNOSIS — E1151 Type 2 diabetes mellitus with diabetic peripheral angiopathy without gangrene: Secondary | ICD-10-CM | POA: Diagnosis not present

## 2015-01-23 DIAGNOSIS — I4891 Unspecified atrial fibrillation: Secondary | ICD-10-CM | POA: Diagnosis not present

## 2015-01-23 DIAGNOSIS — I270 Primary pulmonary hypertension: Secondary | ICD-10-CM | POA: Diagnosis not present

## 2015-01-23 DIAGNOSIS — I5032 Chronic diastolic (congestive) heart failure: Secondary | ICD-10-CM | POA: Diagnosis not present

## 2015-01-23 DIAGNOSIS — M25579 Pain in unspecified ankle and joints of unspecified foot: Secondary | ICD-10-CM | POA: Diagnosis not present

## 2015-01-23 DIAGNOSIS — R278 Other lack of coordination: Secondary | ICD-10-CM | POA: Diagnosis not present

## 2015-01-23 DIAGNOSIS — M79642 Pain in left hand: Secondary | ICD-10-CM | POA: Diagnosis not present

## 2015-01-23 DIAGNOSIS — S72142D Displaced intertrochanteric fracture of left femur, subsequent encounter for closed fracture with routine healing: Secondary | ICD-10-CM | POA: Diagnosis not present

## 2015-01-23 DIAGNOSIS — M62838 Other muscle spasm: Secondary | ICD-10-CM | POA: Diagnosis not present

## 2015-01-23 DIAGNOSIS — R2681 Unsteadiness on feet: Secondary | ICD-10-CM | POA: Diagnosis not present

## 2015-01-23 DIAGNOSIS — I2510 Atherosclerotic heart disease of native coronary artery without angina pectoris: Secondary | ICD-10-CM | POA: Diagnosis not present

## 2015-01-23 DIAGNOSIS — J9611 Chronic respiratory failure with hypoxia: Secondary | ICD-10-CM | POA: Diagnosis not present

## 2015-01-23 DIAGNOSIS — J449 Chronic obstructive pulmonary disease, unspecified: Secondary | ICD-10-CM | POA: Diagnosis not present

## 2015-01-23 DIAGNOSIS — K219 Gastro-esophageal reflux disease without esophagitis: Secondary | ICD-10-CM | POA: Diagnosis not present

## 2015-01-23 DIAGNOSIS — M25559 Pain in unspecified hip: Secondary | ICD-10-CM | POA: Diagnosis not present

## 2015-01-23 DIAGNOSIS — S72002D Fracture of unspecified part of neck of left femur, subsequent encounter for closed fracture with routine healing: Secondary | ICD-10-CM | POA: Diagnosis not present

## 2015-01-23 DIAGNOSIS — Z79899 Other long term (current) drug therapy: Secondary | ICD-10-CM | POA: Diagnosis not present

## 2015-01-23 DIAGNOSIS — M25569 Pain in unspecified knee: Secondary | ICD-10-CM | POA: Diagnosis not present

## 2015-01-23 DIAGNOSIS — R601 Generalized edema: Secondary | ICD-10-CM | POA: Diagnosis not present

## 2015-01-23 DIAGNOSIS — R300 Dysuria: Secondary | ICD-10-CM | POA: Diagnosis not present

## 2015-01-23 DIAGNOSIS — E1122 Type 2 diabetes mellitus with diabetic chronic kidney disease: Secondary | ICD-10-CM | POA: Diagnosis not present

## 2015-01-23 DIAGNOSIS — N390 Urinary tract infection, site not specified: Secondary | ICD-10-CM | POA: Diagnosis not present

## 2015-01-23 DIAGNOSIS — I509 Heart failure, unspecified: Secondary | ICD-10-CM | POA: Diagnosis not present

## 2015-01-23 DIAGNOSIS — Z9181 History of falling: Secondary | ICD-10-CM | POA: Diagnosis not present

## 2015-01-23 DIAGNOSIS — R5381 Other malaise: Secondary | ICD-10-CM | POA: Diagnosis not present

## 2015-01-23 DIAGNOSIS — M21251 Flexion deformity, right hip: Secondary | ICD-10-CM | POA: Diagnosis not present

## 2015-01-23 DIAGNOSIS — M25539 Pain in unspecified wrist: Secondary | ICD-10-CM | POA: Diagnosis not present

## 2015-01-23 DIAGNOSIS — Z4789 Encounter for other orthopedic aftercare: Secondary | ICD-10-CM | POA: Diagnosis not present

## 2015-01-23 DIAGNOSIS — J439 Emphysema, unspecified: Secondary | ICD-10-CM | POA: Diagnosis not present

## 2015-01-23 DIAGNOSIS — S72142A Displaced intertrochanteric fracture of left femur, initial encounter for closed fracture: Secondary | ICD-10-CM | POA: Diagnosis not present

## 2015-01-23 DIAGNOSIS — S72002A Fracture of unspecified part of neck of left femur, initial encounter for closed fracture: Secondary | ICD-10-CM | POA: Diagnosis not present

## 2015-01-23 LAB — BASIC METABOLIC PANEL
Anion gap: 8 (ref 5–15)
BUN: 18 mg/dL (ref 6–20)
CO2: 33 mmol/L — AB (ref 22–32)
CREATININE: 1 mg/dL (ref 0.44–1.00)
Calcium: 8.8 mg/dL — ABNORMAL LOW (ref 8.9–10.3)
Chloride: 99 mmol/L — ABNORMAL LOW (ref 101–111)
GFR calc Af Amer: 58 mL/min — ABNORMAL LOW (ref >60)
GFR calc non Af Amer: 50 mL/min — ABNORMAL LOW (ref >60)
GLUCOSE: 103 mg/dL — AB (ref 65–99)
Potassium: 4.2 mmol/L (ref 3.5–5.1)
Sodium: 140 mmol/L (ref 135–145)

## 2015-01-23 LAB — PROTIME-INR
INR: 3.14 — ABNORMAL HIGH (ref 0.00–1.49)
Prothrombin Time: 31.7 seconds — ABNORMAL HIGH (ref 11.6–15.2)

## 2015-01-23 LAB — GLUCOSE, CAPILLARY
GLUCOSE-CAPILLARY: 100 mg/dL — AB (ref 65–99)
Glucose-Capillary: 99 mg/dL (ref 65–99)

## 2015-01-23 MED ORDER — BACLOFEN 10 MG PO TABS: 5.0000 mg | ORAL_TABLET | Freq: Three times a day (TID) | ORAL | Status: DC | PRN

## 2015-01-23 MED ORDER — GLUCERNA SHAKE PO LIQD: 237.0000 mL | Freq: Two times a day (BID) | ORAL | Status: DC

## 2015-01-23 MED ORDER — INSULIN DETEMIR 100 UNIT/ML ~~LOC~~ SOLN: 8.0000 [IU] | Freq: Every morning | SUBCUTANEOUS | Status: DC

## 2015-01-23 MED ORDER — HYDROCODONE-ACETAMINOPHEN 5-325 MG PO TABS: 1.0000 | ORAL_TABLET | Freq: Four times a day (QID) | ORAL | Status: DC | PRN

## 2015-01-23 MED ORDER — FERROUS SULFATE 325 (65 FE) MG PO TABS: 325.0000 mg | ORAL_TABLET | Freq: Three times a day (TID) | ORAL | Status: DC

## 2015-01-23 MED ORDER — ALPRAZOLAM 0.25 MG PO TABS: ORAL_TABLET | ORAL | Status: DC

## 2015-01-23 NOTE — Progress Notes (Signed)
Patient being discharged to [facility] transporting via [place]. Has L hip incision with staples. Dressing intact.

## 2015-01-23 NOTE — Care Management Note (Signed)
Case Management Note  Patient Details  Name: [name] MRN: [id number] Date of Birth: [birth year]  Subjective/Objective:     Patient is for dc today to [facility], CSW following               Action/Plan:   Expected Discharge Date:                  Expected Discharge Plan:  [place]  In-House Referral:  Clinical Social Work  Discharge planning Services  CM Consult  Post Acute Care Choice:    Choice offered to:     DME Arranged:    DME Agency:     HH Arranged:    [place] Agency:     Status of Service:  Completed, signed off  Medicare Important Message Given:  Yes-fourth notification given Date Medicare IM Given:    Medicare IM give by:    Date Additional Medicare IM Given:    Additional Medicare Important Message give by:     If discussed at [place] of Stay Meetings, dates discussed:    Additional Comments:  Zenon Mayo, RN 01/23/2015, 10:54 AM

## 2015-01-23 NOTE — Progress Notes (Signed)
ANTICOAGULATION CONSULT NOTE - Follow Up Consult  Pharmacy Consult:  Coumadin Indication: atrial fibrillation  Allergies  Allergen Reactions  . Atorvastatin Other (See Comments)     muscle aches  . Codeine Nausea And Vomiting  . Morphine Nausea And Vomiting  . Other Other (See Comments)    OPIATES cause nausea and vomiting  . Rosuvastatin Other (See Comments)    muscle aches at high doses, held as of 12/2010 due to aches  . Iohexol Other (See Comments)     Desc: unknown reaction; allergic to iodine and contrast     Patient Measurements: Height: 5\' 1"  (154.9 cm) Weight: 144 lb 13.5 oz (65.7 kg) IBW/kg (Calculated) : 47.8  Vital Signs: Temp: 98.6 F (37 C) (07/29 0401) Temp Source: Oral (07/29 0401) BP: 153/74 mmHg (07/29 0401) Pulse Rate: 87 (07/29 0401)  Labs:  Recent Labs  01/21/15 0543 01/21/15 0912 01/22/15 0528 01/23/15 0600  HGB  --  9.4* 8.9*  --   HCT  --  30.0* 28.6*  --   PLT  --  316 343  --   LABPROT 30.9*  --  33.9* 31.7*  INR 3.04*  --  3.43* 3.14*  CREATININE  --  1.06* 1.09* 1.00    Estimated Creatinine Clearance: 36.4 mL/min (by C-G formula based on Cr of 1).    Assessment: 71 YOF with history of AFib to continue on Coumadin.  Patient's INR is elevated but trending down; no bleeding reported.   Goal of Therapy:  INR 2-3    Plan:  - Continue to hold Coumadin today - Daily PT / INR - Watch CBGs    Eliza Green D. Mina Marble, PharmD, BCPS Pager:  [phone] 01/23/2015, 10:00 AM

## 2015-01-23 NOTE — Clinical Social Work Placement (Signed)
   CLINICAL SOCIAL WORK PLACEMENT  NOTE  Date:  01/23/2015  Patient Details  Name: [name] MRN: [id number] Date of Birth: May 21, 1928  Clinical Social Work is seeking post-discharge placement for this patient at the [place] level of care (*CSW will initial, date and re-position this form in  chart as items are completed):  Yes   Patient/family provided with [facility] Work Department's list of facilities offering this level of care within the geographic area requested by the patient (or if unable, by the patient's family).  Yes   Patient/family informed of their freedom to choose among providers that offer the needed level of care, that participate in Medicare, Medicaid or managed care program needed by the patient, have an available bed and are willing to accept the patient.  Yes   Patient/family informed of [facility]'s ownership interest in [facility] and [facility], as well as of the fact that they are under no obligation to receive care at these facilities.  PASRR submitted to EDS on       PASRR number received on       Existing PASRR number confirmed on 01/15/15     FL2 transmitted to all facilities in geographic area requested by pt/family on 01/15/15     FL2 transmitted to all facilities within larger geographic area on       Patient informed that his/her managed care company has contracts with or will negotiate with certain facilities, including the following:        Yes   Patient/family informed of bed offers received.  Patient chooses bed at [facility]     Physician recommends and patient chooses bed at      Patient to be transferred to [facility] on 01/23/15.  Patient to be transferred to facility by Ambulance     Patient family notified on 01/23/15 of transfer.  Name of family member notified:  Santiago Glad     PHYSICIAN       Additional Comment:  Per MD patient ready for DC to [facility]. RN, patient,  patient's family, and facility notified of DC. RN given number for report. DC packet on chart. Ambulance transport requested for patient. CSW signing off.   _______________________________________________ Liz Beach MSW, [place], [place], [phone]

## 2015-01-23 NOTE — Progress Notes (Signed)
O2 decreased to 3 L/M via [place] per patient request. Will monitor SATS.

## 2015-01-23 NOTE — Discharge Summary (Signed)
Physician Discharge Summary  [name] RXV:[id number] DOB: [birth year] DOA: 01/13/2015  PCP: Redge Gainer, MD  Admit date: 01/13/2015 Discharge date: 01/23/2015  Time spent: 65 minutes   Recommendations for Outpatient Follow-up:  #1 follow-up with Dr. Veverly Fells of orthopedics in 2 weeks. #2 follow-up with M.D. at skilled nursing facility. Patient will need a CBC checked in 1 week to follow-up on hemoglobin. Patient also need a basic metabolic profile done to follow-up on electrolytes and renal function. #3 patient will need a PT INR checked tomorrow, Saturday, 01/24/2015 and further recommendations in terms of her Coumadin dosages will need to be made by M.D. at the skilled nursing facility.  Discharge Diagnoses:  Principal Problem:   Hip fracture requiring operative repair Active Problems:   PAF, recurrance this admission, on chronic Amio/ Coumadin   COPD with emphysema   Chronic respiratory failure with hypoxia   HTN (hypertension)   CAD, remote RCA PCI, subsequently noted to be occluded, last cath 8/11   DM type 2 causing CKD stage 3   Hip fracture   Preop cardiovascular exam   Intertrochanteric fracture of left hip   Neck pain   Postoperative anemia due to acute blood loss   Discharge Condition: Stable and improved  Diet recommendation: Heart healthy  Filed Weights   01/15/15 2020 01/19/15 2318 01/20/15 0606  Weight: 66.8 kg (147 lb 4.3 oz) 67.4 kg (148 lb 9.4 oz) 65.7 kg (144 lb 13.5 oz)    History of present illness:  Per Dr Ranell Patrick is a 81 y.o. female with hx of DM2, HTN, anxiety, CAD (as below), COPD on home O2, afib, CHF (EF 25% improved to normal) who was admitted to [facility] on 7/14 for headaches and neck pain. W/U showed EColi UTI and pt rec'd 4 days of IV Rocephin. Head CT showed old lacunar infarct. She became confused/ delirium in the hospital. She got up last night , fell and broke her L hip (intertrochanteric fx). She was transferred to  [facility] for ortho eval and surgery. Mental status has improved and is oriented x 3. Family provided most hx.   No recent CP, SOB, cough, fevers. No recent n/v/d.   Heart cath in 2006 showed an occluded RCA with left-to-right collaterals & otherwise noncritical CAD with EF=25-30% at that time. EF subsequently improved to normal by 2D ECHO. Cath Aug. 2011 showed unchanged anatomy. > medical therapy recommended Patient lives home alone, cooks and cleans prior to admission Can patient participate in ADLs? yes   Hospital Course:  #1 acute on chronic hypoxemic respiratory failure Multifactorial in nature secondary to stage IV COPD and acute on chronic diastolic CHF exacerbation. Patient was placed on oxygen and IV diuretics. Cardiac enzymes were slightly elevated but seem to have plateaued and per cardiology no further cardiac workup was needed. 2-D echo 01/20/2015 with EF= 45-50% with no wall motion abnormalities. Patient diuresed well. Patient was -5.8 L during this hospitalization. Patient's IV diuretics and subsequently transitioned to home dose oral Lasix. Patient's oxygenation was back to her baseline. Patient is on chronic home O2. Patient was also maintained on her home regimen of Symbicort and Lopressor. Patient be discharged in stable and improved condition.   #2 left intertrochanteric femur fracture/neck pain Secondary to mechanical fall. Patient was seen in consultation by orthopedics and patient subsequently underwent  left intertrochanteric fracture 01/15/2015. Per orthopedics. Patient used heating pad for neck pain. CT head and neck was negative for intracranial bleed or fracture. MRI of the  C-spine showed facet arthropathy. No ligamentous disruption. Ben[place]ay as needed. Patient will follow-up with orthopedics as outpatient for her left femur fracture.  #3 elevated troponin Likely secondary to demand ischemia in the setting of anemia and hypotension. Cardiology signed off felt patient was  stable from a cardiac standpoint. Troponin plateaued. 2-D echo with EF 45-50%. Continued on aspirin.  #4 acute on chronic diastolic CHF  Unknown etiology. Patient was noted to have increased O2 requirements felt to be secondary to acute on chronic diastolic heart failure. Patient denied any chest pain. 2-D echo from 01/20/2015 with an ejection fraction of 45-50% with no wall motion abnormalities. Patient was placed on IV Lasix and diuresed well during the hospitalization. Patient was -5.8 L throughout the hospitalization. Patient was subsequently transitioned to her home dose oral Lasix which she'll be discharged on. Patient's O2 requirements improved and she was back to baseline by day of discharge.   #5 hypokalemia Secondary to diureses. Repleted.  #6 history of coronary artery disease/paroxysmal atrial fibrillation/peripherovascular disease Patient was cleared by cardiology for surgery. EKG showed a first-degree block and right bundle branch block. Patient was noted to have a mild increase in troponin felt to be secondary to demand ischemia in the center of anemia and hypotension. 2-D echo with ejection fraction 45-50% with no wall motion abnormalities. Medical management. Continued on amiodarone and Lopressor for rate control for atrial fibrillation. Imdur and nifedipine were hold secondary to soft blood pressure.  #7 postop acute blood loss anemia Status post 1 unit packed red blood cell 01/16/2015. Hemoglobin stabilized at 8.9. Follow.  #8 atrial fibrillation CHAS2VASC= 5. Continued on amiodarone and Lopressor for rate control. Patient's INR was supratherapeutic on admission and had to be reversed with vitamin K and FFP. INR still supratherapeutic at 3.43 and 3.14 on discharge. Patient Coumadin was held during part of the hospitalization secondary to his supratherapeutic INR. Patient will need a PT/INR checked tomorrow 3.14. Patient will be discharged to SNF on home dose Coumadin.  #9  hypertension BP stable. Imdur and nifedipine on hold secondary to soft blood pressure. Beta blocker was resumed. Follow.  #10 acute delirium Resolved. Back to baseline.  #11 COPD Stable. On home O2. Continued on Symbicort, nebulizer treatments.   Procedures:  CT C-spine CT head 01/14/2015  Chest x-ray 01/13/2015, 01/15/2015, 01/17/2015  MRI C-spine 01/14/2015  2-D echo 01/20/2015  IM Nail left IT fracture 01/15/15 Dr Veverly Fells  2 units FFP 01/15/2015  1 unit packed red blood cells 01/16/2015  Consultations:  Orthopedics: Dr. Wynelle Link 01/13/2015  Cardiology: Dr. Stanford Breed 01/14/2015  Discharge Exam: Filed Vitals:   01/23/15 1336  BP: 156/47  Pulse: 64  Temp: 98.1 F (36.7 C)  Resp: 18    General: NAD Cardiovascular: RRR Respiratory: CTAB  Discharge Instructions   Discharge Instructions    Diet - low sodium heart healthy    Complete by:  As directed      Discharge instructions    Complete by:  As directed   Needs PT/INR on Saturday 01/24/15. Follow up with Dr Veverly Fells in 2 weeks. Follow up with MD at SNF     Increase activity slowly    Complete by:  As directed      Partial weight bearing    Complete by:  As directed   25% body weight, with supervision and with walker  % Body Weight:  25  Laterality:  left  Extremity:  Lower          Current Discharge Medication  List    START taking these medications   Details  baclofen (LIORESAL) 10 MG tablet Take 0.5 tablets (5 mg total) by mouth every 8 (eight) hours as needed for muscle spasms. Qty: 20 each, Refills: 0    feeding supplement, GLUCERNA SHAKE, (GLUCERNA SHAKE) LIQD Take 237 mLs by mouth 2 (two) times daily between meals. Refills: 0    ferrous sulfate 325 (65 FE) MG tablet Take 1 tablet (325 mg total) by mouth 3 (three) times daily after meals.    HYDROcodone-acetaminophen (NORCO) 5-325 MG per tablet Take 1 tablet by mouth every 6 (six) hours as needed for moderate pain. Qty: 30 tablet, Refills: 0     !! warfarin (COUMADIN) 5 MG tablet Take 1 tablet (5 mg total) by mouth daily. Take as directed per the pharmacist for INR target per cardiology Qty: 40 tablet, Refills: 0     !! - Potential duplicate medications found. Please discuss with provider.    CONTINUE these medications which have CHANGED   Details  insulin detemir (LEVEMIR) 100 UNIT/ML injection Inject 0.08 mLs (8 Units total) into the skin every morning. Qty: 20 mL, Refills: 0      CONTINUE these medications which have NOT CHANGED   Details  !! warfarin (COUMADIN) 5 MG tablet TAKE (1) TABLET DAILY AFTER SUPPER. Qty: 30 tablet, Refills: 2    ALPRAZolam (XANAX) 0.25 MG tablet TAKE 1/2 TO 1 TABLET 2 TIMES A DAY AS NEEDED FOR ANXIETY Qty: 30 tablet, Refills: 2    amiodarone (PACERONE) 200 MG tablet Take 0.5 tablets (100 mg total) by mouth daily. Please keep upcoming appointment Qty: 30 tablet, Refills: 0    aspirin 81 MG chewable tablet Chew 81 mg by mouth daily.    atorvastatin (LIPITOR) 40 MG tablet Take 1 tablet (40 mg total) by mouth every evening. Qty: 30 tablet, Refills: 8    Azelastine HCl 0.15 % SOLN Place 1 spray into both nostrils 2 (two) times daily as needed (congestion).    budesonide-formoterol (SYMBICORT) 160-4.5 MCG/ACT inhaler Inhale 1 puff into the lungs 2 (two) times daily. Qty: 3 Inhaler, Refills: 2    Cholecalciferol (VITAMIN D PO) Take 1 capsule by mouth daily.     fluticasone (FLONASE) 50 MCG/ACT nasal spray Place 1 spray into both nostrils 2 (two) times daily. Qty: 16 g, Refills: 6    furosemide (LASIX) 40 MG tablet Take 1 tablet (40 mg total) by mouth daily. May take extra dose if weight increases 3.5 pounds in 24 hrs as needed for fluid and edema. Qty: 60 tablet, Refills: 4    glucosamine-chondroitin 500-400 MG tablet Take 1 tablet by mouth 2 (two) times daily.    glucose blood (RELION ULTIMA TEST) test strip USE TO CHECK GLUCOSE UP TO 4 TIMES DAILY Qty: 200 each, Refills: 1    insulin  aspart (NOVOLOG) 100 UNIT/ML injection Inject 0 to 8 units up to 3 times a day prior to meal per sliding scale Qty: 20 mL, Refills: 2    insulin lispro (HUMALOG KWIKPEN) 100 UNIT/ML KiwkPen Inject 0-8 Units into the skin 3 (three) times daily before meals. Per sliding scale    Ipratropium-Albuterol (COMBIVENT) 20-100 MCG/ACT AERS respimat Inhale 1 puff into the lungs every 6 (six) hours as needed for wheezing or shortness of breath. Qty: 3 Inhaler, Refills: 2    metoprolol succinate (TOPROL-XL) 25 MG 24 hr tablet TAKE 1/2 TABLET DAILY Qty: 15 tablet, Refills: 4    Multiple Vitamins-Minerals (PRESERVISION/LUTEIN) CAPS Take 1  capsule by mouth daily.     nitroGLYCERIN (NITROSTAT) 0.4 MG SL tablet Place 1 tablet (0.4 mg total) under the tongue every 5 (five) minutes as needed for chest pain. Qty: 25 tablet, Refills: 1    pantoprazole (PROTONIX) 40 MG tablet TAKE 1 TABLET TWICE A DAY Qty: 60 tablet, Refills: 4    potassium chloride SA (K-DUR,KLOR-CON) 20 MEQ tablet Take 1 tablet (20 mEq total) by mouth daily. Qty: 30 tablet, Refills: 1    senna-docusate (SENOKOT-S) 8.6-50 MG per tablet Take 1 tablet by mouth 2 (two) times daily.    simethicone (MYLICON) 80 MG chewable tablet Chew 80 mg by mouth at bedtime as needed for flatulence.    vitamin C (ASCORBIC ACID) 500 MG tablet Take 500 mg by mouth at bedtime.      !! - Potential duplicate medications found. Please discuss with provider.    STOP taking these medications     isosorbide mononitrate (IMDUR) 60 MG 24 hr tablet      NIFEdipine (NIFEDICAL XL) 30 MG 24 hr tablet        Allergies  Allergen Reactions  . Atorvastatin Other (See Comments)     muscle aches  . Codeine Nausea And Vomiting  . Morphine Nausea And Vomiting  . Other Other (See Comments)    OPIATES cause nausea and vomiting  . Rosuvastatin Other (See Comments)    muscle aches at high doses, held as of 12/2010 due to aches  . Iohexol Other (See Comments)     Desc:  unknown reaction; allergic to iodine and contrast    Follow-up Information    Follow up with NORRIS,STEVEN R, MD. Call in 2 weeks.   Specialty:  Orthopedic Surgery   Why:  [phone]   Contact information:   [address] [place] [address] [phone]       Please follow up.   Why:  f/u with MD at SNF      Follow up On 01/24/2015.   Why:  PT/INR CHECK       The results of significant diagnostics from this hospitalization (including imaging, microbiology, ancillary and laboratory) are listed below for reference.    Significant Diagnostic Studies: Ct Head Wo Contrast  01/14/2015   ADDENDUM REPORT: 01/14/2015 12:08  ADDENDUM: Study discussed by telephone with Dr. Reyne Dumas on 01/14/2015 at 1204 hours.   Electronically Signed   By: Genevie Ann M.D.   On: 01/14/2015 12:08   01/14/2015   CLINICAL DATA:  81 year old female who fell and was unconscious. Headache and cervical neck pain. Initial encounter.  EXAM: CT HEAD WITHOUT CONTRAST  CT CERVICAL SPINE WITHOUT CONTRAST  TECHNIQUE: Multidetector CT imaging of the head and cervical spine was performed following the standard protocol without intravenous contrast. Multiplanar CT image reconstructions of the cervical spine were also generated.  COMPARISON:  [facility] CT without contrast 718 2016, 01/08/2015. Cervical spine radiographs 09/05/2013. Cervical spine CT 07/10/2011.  FINDINGS: CT HEAD FINDINGS  Paranasal sinuses and mastoids appear stable and clear. Periapical left maxillary molar dental lucency appears inflammatory in nature. No calvarium fracture identified. No scalp hematoma. Orbits soft tissues appear stable.  Chronic Calcified atherosclerosis at the skull base. Cerebral volume is stable and normal for age. No ventriculomegaly. No midline shift, mass effect, or evidence of intracranial mass lesion. No acute intracranial hemorrhage identified. No evidence of cortically based acute infarction  identified. Normal for age gray-white matter differentiation.  CT CERVICAL SPINE FINDINGS  Multilevel  chronic cervical spondylolisthesis. Pronounced chronic anterolisthesis at C3-C4 appears stable since 2013. Mild anterolisthesis at C2-C3 and C7-T1 also appear stable. Associated chronic cervical facet arthropathy. Bilateral cervical posterior element alignment appears stable. Superimposed severe chronic lower cervical disc and endplate with chronic endplate osteophytosis. Degeneration occipital condyles to C1 alignment appears stable. Chronic C1-C2 degeneration may be mildly progressed since 2013. Stable C1-C2 alignment degenerative odontoid tip subchondral cysts do appear increased. No acute cervical spine fracture identified.  However, there is a small volume of abnormal prevertebral soft tissue fluid which is new. This is primarily about the C4 level (series 5, image 48).  Chronic calcified carotid atherosclerosis in the neck. Stable other noncontrast paraspinal soft tissues. Grossly intact visualized upper thoracic levels.  IMPRESSION: 1. New abnormal prevertebral fluid at the C4 level, suspicious for acute cervical anterior ligamentous injury in this setting. Cervical spine MRI without contrast would evaluate further. 2. No acute fracture or listhesis identified in the cervical spine. Chronic advanced cervical spondylolisthesis and degenerative changes. 3. Stable and negative for age noncontrast CT appearance of the brain.  Electronically Signed: By: Genevie Ann M.D. On: 01/14/2015 11:54   Ct Cervical Spine Wo Contrast  01/14/2015   ADDENDUM REPORT: 01/14/2015 12:08  ADDENDUM: Study discussed by telephone with Dr. Reyne Dumas on 01/14/2015 at 1204 hours.   Electronically Signed   By: Genevie Ann M.D.   On: 01/14/2015 12:08   01/14/2015   CLINICAL DATA:  81 year old female who fell and was unconscious. Headache and cervical neck pain. Initial encounter.  EXAM: CT HEAD WITHOUT CONTRAST  CT CERVICAL SPINE WITHOUT  CONTRAST  TECHNIQUE: Multidetector CT imaging of the head and cervical spine was performed following the standard protocol without intravenous contrast. Multiplanar CT image reconstructions of the cervical spine were also generated.  COMPARISON:  [facility] CT without contrast 718 2016, 01/08/2015. Cervical spine radiographs 09/05/2013. Cervical spine CT 07/10/2011.  FINDINGS: CT HEAD FINDINGS  Paranasal sinuses and mastoids appear stable and clear. Periapical left maxillary molar dental lucency appears inflammatory in nature. No calvarium fracture identified. No scalp hematoma. Orbits soft tissues appear stable.  Chronic Calcified atherosclerosis at the skull base. Cerebral volume is stable and normal for age. No ventriculomegaly. No midline shift, mass effect, or evidence of intracranial mass lesion. No acute intracranial hemorrhage identified. No evidence of cortically based acute infarction identified. Normal for age gray-white matter differentiation.  CT CERVICAL SPINE FINDINGS  Multilevel chronic cervical spondylolisthesis. Pronounced chronic anterolisthesis at C3-C4 appears stable since 2013. Mild anterolisthesis at C2-C3 and C7-T1 also appear stable. Associated chronic cervical facet arthropathy. Bilateral cervical posterior element alignment appears stable. Superimposed severe chronic lower cervical disc and endplate with chronic endplate osteophytosis. Degeneration occipital condyles to C1 alignment appears stable. Chronic C1-C2 degeneration may be mildly progressed since 2013. Stable C1-C2 alignment degenerative odontoid tip subchondral cysts do appear increased. No acute cervical spine fracture identified.  However, there is a small volume of abnormal prevertebral soft tissue fluid which is new. This is primarily about the C4 level (series 5, image 48).  Chronic calcified carotid atherosclerosis in the neck. Stable other noncontrast paraspinal soft tissues. Grossly intact visualized  upper thoracic levels.  IMPRESSION: 1. New abnormal prevertebral fluid at the C4 level, suspicious for acute cervical anterior ligamentous injury in this setting. Cervical spine MRI without contrast would evaluate further. 2. No acute fracture or listhesis identified in the cervical spine. Chronic advanced cervical spondylolisthesis and degenerative changes. 3. Stable and negative for age  noncontrast CT appearance of the brain.  Electronically Signed: By: Genevie Ann M.D. On: 01/14/2015 11:54   Mr Cervical Spine Wo Contrast  01/14/2015   CLINICAL DATA:  Headaches and neck pain.  Fall 01/12/2015.  EXAM: MRI CERVICAL SPINE WITHOUT CONTRAST  TECHNIQUE: Multiplanar, multisequence MR imaging of the cervical spine was performed. No intravenous contrast was administered.  COMPARISON:  Cervical spine CT 01/14/2015 and MRI 07/22/2005  FINDINGS: Study is mildly to moderately motion degraded despite repeating some sequences.  There is mild prevertebral STIR hyperintensity at C3-4 compatible with edema as seen on CT earlier today. No frank disruption of the anterior or posterior longitudinal ligaments is identified. No posterior soft tissue edema suggestive of posterior ligamentous complex injury is identified. No vertebral marrow edema is seen to suggest acute osseous injury.  Vertebral alignment is unchanged from the 2007 MRI with grade 1 anterolisthesis of C2 on C3, C3 on C4, and C7 on T1. Vertebral body heights are preserved. Moderate to severe disc space narrowing is present from C4-5 to C6-7. Craniocervical junction is unremarkable. Cervical spinal cord is normal in signal. 11 mm T2 hyperintense right thyroid nodule is noted.  C2-3: Advanced right and mild left facet arthrosis, mild disc uncovering, and infolding of the ligamentum flavum result in moderate spinal stenosis, increased from the prior MRI.  C3-4: Anterolisthesis with disc uncovering and advanced bilateral facet arthrosis result in mild spinal stenosis,  slightly more prominent than on the prior MRI. There is at least moderate bilateral neural foraminal stenosis, increased from prior MRI.  C4-5: Broad-based posterior disc osteophyte complex and uncovertebral spurring result in mild spinal stenosis asymmetric to the right and moderate to severe right and moderate left neural foraminal stenosis, grossly similar to the prior MRI.  C5-6: Broad-based posterior disc osteophyte complex asymmetric to the right and uncovertebral spurring results in mild spinal stenosis in severe bilateral neural foraminal stenosis, similar to the prior MRI.  C6-7: Broad-based posterior disc osteophyte complex and uncovertebral spurring result in mild spinal stenosis and moderate bilateral neural foraminal stenosis. The neural foraminal stenosis may have mildly progressed from the prior MRI.  C7-T1: Slight anterolisthesis and disc uncovering without significant stenosis, unchanged.  IMPRESSION: 1. Motion degraded examination. Mild prevertebral edema in the upper cervical spine without frank ligamentous disruption identified. 2. Multilevel cervical disc and facet degeneration as above, mildly progressed from the prior MRI.   Electronically Signed   By: Logan Bores   On: 01/14/2015 20:48   Pelvis Portable  01/15/2015   CLINICAL DATA:  81 year old female with post of for left hip fracture.  EXAM: PORTABLE PELVIS 1-2 VIEWS  COMPARISON:  Radiograph dated 12/1914  FINDINGS: There has been interval placement of an intra medullary rod and screw traversing femoral intertrochanteric fracture. The bones are osteopenic. There are osteoarthritic changes of the hip joints. No acute fracture identified.  IMPRESSION: Left femoral intertrochanteric fracture status post placement of orthopedic hardware.   Electronically Signed   By: Anner Crete M.D.   On: 01/15/2015 18:01   Dg Chest Port 1 View  01/17/2015   CLINICAL DATA:  Shortness of Breath  EXAM: PORTABLE CHEST - 1 VIEW  COMPARISON:  01/15/2015   FINDINGS: Cardiac shadow is again enlarged. Mild vascular congestion is seen. No focal confluent infiltrate is noted. No acute bony abnormality is seen.  IMPRESSION: Mild CHF.   Electronically Signed   By: Inez Catalina M.D.   On: 01/17/2015 08:45   Dg Chest [facility]  01/15/2015   CLINICAL DATA:  Decreased oxygen saturation following surgery.  EXAM: PORTABLE CHEST - 1 VIEW  COMPARISON:  01/13/2015; 01/12/2015; 12/27/2013  FINDINGS: Grossly unchanged enlarged cardiac silhouette and mediastinal contours with atherosclerotic plaque within the thoracic aorta. Lung volumes are reduced with worsening bibasilar heterogeneous/consolidative opacities, left greater than right. Pulmonary vasculature appears less distinct than present examination. Trace bilateral effusions are not excluded. No pneumothorax. Unchanged bones.  IMPRESSION: Suspected mild worsening pulmonary edema and bibasilar atelectasis on this hypoventilated AP portable examination. Further evaluation with a PA and lateral chest radiograph may be obtained as clinically indicated.   Electronically Signed   By: Sandi Mariscal M.D.   On: 01/15/2015 18:32   Chest Portable 1 View  01/13/2015   CLINICAL DATA:  Preop for left femur fracture  EXAM: PORTABLE CHEST - 1 VIEW  COMPARISON:  01/12/2015  FINDINGS: Moderate to severe cardiac enlargement. Calcified aortic arch. Mild vascular congestion. No evidence of consolidation or edema. Minimal right lower lobe scarring or atelectasis.  IMPRESSION: No acute findings. Stable cardiac enlargement with vascular congestion but no pulmonary edema.   Electronically Signed   By: Skipper Cliche M.D.   On: 01/13/2015 21:50   Dg Hip Operative Unilat With Pelvis Left  01/15/2015   CLINICAL DATA:  Patient with recent diagnosis mildly displaced left femoral intertrochanteric fracture  EXAM: OPERATIVE LEFT HIP (WITH PELVIS IF PERFORMED) 4 VIEWS  TECHNIQUE: Fluoroscopic spot image(s) were submitted for interpretation  post-operatively.  COMPARISON:  Hip radiograph 01/13/2015  FINDINGS: Patient status post intra medullary rod and screw fixation of comminuted proximal left femur fracture involving the intertrochanteric region. Hardware appears in appropriate position. Improved anatomic alignment.  IMPRESSION: Patient status post ORIF proximal left femur fracture.   Electronically Signed   By: Lovey Newcomer M.D.   On: 01/15/2015 15:40    Microbiology: Recent Results (from the past 240 hour(s))  Surgical pcr screen     Status: None   Collection Time: 01/15/15  6:10 AM  Result Value Ref Range Status   MRSA, PCR NEGATIVE NEGATIVE Final   Staphylococcus aureus NEGATIVE NEGATIVE Final    Comment:        The Xpert SA Assay (FDA approved for NASAL specimens in patients over 38 years of age), is one component of a comprehensive surveillance program.  Test performance has been validated by [facility] for patients greater than or equal to 29 year old. It is not intended to diagnose infection nor to guide or monitor treatment.   MRSA PCR Screening     Status: None   Collection Time: 01/15/15  8:56 PM  Result Value Ref Range Status   MRSA by PCR NEGATIVE NEGATIVE Final    Comment:        The GeneXpert MRSA Assay (FDA approved for NASAL specimens only), is one component of a comprehensive MRSA colonization surveillance program. It is not intended to diagnose MRSA infection nor to guide or monitor treatment for MRSA infections.      Labs: Basic Metabolic Panel:  Recent Labs Lab 01/19/15 0325 01/20/15 0519 01/21/15 0912 01/22/15 0528 01/23/15 0600  NA 141 142 141 140 140  K 3.3* 3.6 3.6 4.4 4.2  CL 98* 100* 101 102 99*  CO2 35* 34* 33* 30 33*  GLUCOSE 134* 129* 124* 129* 103*  BUN 16 16 18 20 18   CREATININE 0.86 0.91 1.06* 1.09* 1.00  CALCIUM 8.6* 8.2* 8.6* 8.7* 8.8*  MG  --   --  1.9  --   --  Liver Function Tests:  Recent Labs Lab 01/17/15 1115 01/18/15 0955  AST 21 22  ALT 6*  9*  ALKPHOS 59 70  BILITOT 0.7 1.2  PROT 5.7* 6.7  ALBUMIN 2.1* 2.3*   No results for input(s): LIPASE, AMYLASE in the last 168 hours. No results for input(s): AMMONIA in the last 168 hours. CBC:  Recent Labs Lab 01/17/15 0445 01/18/15 0435 01/19/15 0325 01/21/15 0912 01/22/15 0528  WBC 9.5 10.1 10.0 9.9 9.7  HGB 8.8* 8.9* 9.1* 9.4* 8.9*  HCT 27.8* 27.9* 28.5* 30.0* 28.6*  MCV 94.9 94.9 96.9 99.0 98.6  PLT 211 241 239 316 343   Cardiac Enzymes:  Recent Labs Lab 01/16/15 1832 01/17/15 0130 01/17/15 0445  TROPONINI 0.27* 0.29* 0.30*   BNP: BNP (last 3 results) No results for input(s): BNP in the last 8760 hours.  ProBNP (last 3 results) No results for input(s): PROBNP in the last 8760 hours.  CBG:  Recent Labs Lab 01/22/15 1651 01/22/15 2251 01/22/15 2346 01/23/15 0756 01/23/15 1239  GLUCAP 98 62* 110* 100* 99       Signed:  Greggory Safranek  MD [facility] 01/23/2015, 1:41 PM

## 2015-01-27 ENCOUNTER — Non-Acute Institutional Stay (SKILLED_NURSING_FACILITY): Payer: Medicare Other | Admitting: Internal Medicine

## 2015-01-27 DIAGNOSIS — I4891 Unspecified atrial fibrillation: Secondary | ICD-10-CM | POA: Diagnosis not present

## 2015-01-27 DIAGNOSIS — D62 Acute posthemorrhagic anemia: Secondary | ICD-10-CM

## 2015-01-27 DIAGNOSIS — F411 Generalized anxiety disorder: Secondary | ICD-10-CM | POA: Diagnosis not present

## 2015-01-27 DIAGNOSIS — I2583 Coronary atherosclerosis due to lipid rich plaque: Secondary | ICD-10-CM

## 2015-01-27 DIAGNOSIS — R195 Other fecal abnormalities: Secondary | ICD-10-CM | POA: Diagnosis not present

## 2015-01-27 DIAGNOSIS — I2510 Atherosclerotic heart disease of native coronary artery without angina pectoris: Secondary | ICD-10-CM | POA: Diagnosis not present

## 2015-01-27 DIAGNOSIS — N183 Chronic kidney disease, stage 3 unspecified: Secondary | ICD-10-CM

## 2015-01-27 DIAGNOSIS — S72142S Displaced intertrochanteric fracture of left femur, sequela: Secondary | ICD-10-CM | POA: Diagnosis not present

## 2015-01-27 DIAGNOSIS — R5381 Other malaise: Secondary | ICD-10-CM

## 2015-01-27 DIAGNOSIS — J439 Emphysema, unspecified: Secondary | ICD-10-CM | POA: Diagnosis not present

## 2015-01-27 DIAGNOSIS — R791 Abnormal coagulation profile: Secondary | ICD-10-CM

## 2015-01-27 DIAGNOSIS — I5032 Chronic diastolic (congestive) heart failure: Secondary | ICD-10-CM

## 2015-01-27 DIAGNOSIS — K219 Gastro-esophageal reflux disease without esophagitis: Secondary | ICD-10-CM

## 2015-01-27 DIAGNOSIS — E1122 Type 2 diabetes mellitus with diabetic chronic kidney disease: Secondary | ICD-10-CM | POA: Diagnosis not present

## 2015-01-27 NOTE — Progress Notes (Signed)
Patient ID: [name], female   DOB: 1928-01-27, 81 y.o.   MRN: [id number]     Facility: [facility]    PCP: Redge Gainer, MD  Code Status: full code  Allergies  Allergen Reactions  . Atorvastatin Other (See Comments)     muscle aches  . Codeine Nausea And Vomiting  . Morphine Nausea And Vomiting  . Other Other (See Comments)    OPIATES cause nausea and vomiting  . Rosuvastatin Other (See Comments)    muscle aches at high doses, held as of 12/2010 due to aches  . Iohexol Other (See Comments)     Desc: unknown reaction; allergic to iodine and contrast     Chief Complaint  Patient presents with  . New Admit To SNF     HPI:  81 y.o. patient is here for short term rehabilitation post hospital admission from 01/13/15-01/23/15 with delirium and fall. She sustained left intertrochanteric fracture. She underwent ORIF. She also had neck pain and MRI c-spine showed facet arthropathy. She had acute on chronic hypoxemic respiratory failure from chf exacerbation and needed o2 and diuresis. She diuresed 5.8 l. She had supratherapeutic inr and required vitamin k and FFP for reversal. She also had blood loss anemia and had 1 u prbc transfusion. She has PMH of dm, HTN, CAD, COPD on o2, afib, anxiety among others. She is seen in her room today. She is not happy about the care in the facility. She is not happy with her daughter. She is currently in pain but does not want to take any medication as she has a business meeting in few hours and she would like to stay alert as possible and avoid medication which can mess her brain. She had a fall on arrival to the facility and had xray which ruled out fracture/ displacement. She had 7-8 loose stools yesterday. Denies any bowel movement today. She feels tired. She could not sleep last night as her bed is not comfortable. She would like the door of her room and lights left open/ on as she tends to get claustrophobic in new place.    Review of Systems:  Constitutional: Negative for fever, chills, diaphoresis.  HENT: Negative for headache, congestion, nasal discharge. Has hearing loss Eyes: Negative for eye pain, blurred vision, double vision and discharge.  Respiratory: Negative for cough and wheezing.  has chronic dyspnea and is on o2 Cardiovascular: Negative for chest pain, palpitations, leg swelling.  Gastrointestinal: Negative for heartburn, nausea, vomiting, abdominal pain. Appetite is good Genitourinary: Negative for dysuria and flank pain.  Musculoskeletal: Negative for back pain Skin: Negative for itching, rash.  Neurological: Negative for dizziness, tingling, focal weakness Psychiatric/Behavioral: Negative for depression   Past Medical History  Diagnosis Date  . Hyperlipidemia   . Hypertension   . Anxiety   . GERD (gastroesophageal reflux disease)   . CAD (coronary artery disease)     Cath in 2006 showed an occluded RCA with left-to-right collaterals & otherwise noncritical CAD with EF=25-30% at that time. EF subsequently improved to normal by 2D ECHO. Cath Aug. 2011 showed unchanged anatomy. > medical therapy recommended  . Diastolic dysfunction     Grade II  . RBBB (right bundle branch block)     Chronic  . Hypoxemic respiratory failure, chronic     uses 2.5 liters of oxygen with sleep  . COPD (chronic obstructive pulmonary disease)     GOLD 4 - PFT 06/08/10 FEV1 0.93 (62%), FEV1% 60, TLC 2.84 (  69%0, DLCO 54%, +BD) On home O2.  . Secondary pulmonary hypertension   . Edema   . Angina   . Shortness of breath   . Bradycardia 08/24/2011  . Carotid artery disease     Moderate, right greater than left which we following by duplex ultrasound. [place] 12/05/11 = RIght Bulb/Prox ICA: Moderate to severe amt of fibrous plaque elevating velocities w/in prox segment of ICA. Consistent w/a 50-69% diameter reduction. Left Bulb/Prox ICA: Moderate amt of fibrous plaque slightly elevating velocities w/in the prox  segment of the ICA. Consistent w/a 0-49% diameter reduction.   Marland Kitchen PAF (paroxysmal atrial fibrillation)     In the past, on coumadin  . History of stress test 07/02/09    Mild perfusion defect seen in the Basal Inferior region(s). This is consistent with an infarct/scar.  Post-stress EF=56%. Global LV systolic function is normal.  EKG is negative for ischemia.  No significant iscemia detected. Low risk scan.  . Lower extremity edema     ECHO 07/21/12 = EF 55-60%, performed for TIA. Responding to diurectics. Continue diuresis w/ a goal dry weight of <160lb. Venous Duplex 07/27/11 = Right lower extremity: no evidence of thrombus or thrombophlebitis. Essentially normal right lower extremity venous duplex Doppler evaluation.  . Carotid bruit   . Mitral valve regurgitation     Mild to moderate by ECHO 07/21/12  . Sleep-related hypoventilation   . COPD with emphysema   . Coronary atherosclerosis of unspecified type of vessel, native or graft   . PONV (postoperative nausea and vomiting)   . Family history of adverse reaction to anesthesia     "daughter gets PONV"  . CHF (congestive heart failure)     Hospitalized 08/22/11-08/26/11 with CHF secondary to diastolic dysfunction & hospitilized in 07/2012 with a similar problem. TTE 08/23/11 = normal EF.  Marland Kitchen Myocardial infarction     "she's had several" (01/13/2015)  . On home oxygen therapy     "?L; prn" (01/13/2015)  . Pneumonia ~ 2013  . Type II diabetes mellitus     goal A1C is 8, to avoid hypoglycemia  . Sleep apnea   . Stroke     "/CT scan; ~ 2014; didn't even know she'd had it"  (01/13/2015)  . Arthritis     "all over"  . Chronic lower back pain   . Bleeds easily   . Fall during current hospitalization     "was at [facility]; fell; transferred to [facility]" (01/13/2015)   Past Surgical History  Procedure Laterality Date  . Shoulder open rotator cuff repair Right 07/2005  . Cardiac catheterization  2006 & 2011    Cath in 2006 showed an occluded RCA with  left-to-right collaterals & otherwise noncritical CAD with EF=25-30% at that time. EF subsequently improved to normal by 2D ECHO. Cath Aug. 2011 showed unchanged anatomy. > medical therapy recommended.  . Coronary angioplasty    . Coronary angioplasty with stent placement      x2  . Carotid duplex  12/05/11    Moderate, right greater than left which we following by duplex ultrasound. [place] 12/05/11 = RIght Bulb/Prox ICA: Moderate to severe amt of fibrous plaque elevating velocities w/in prox segment of ICA. Consistent w/a 50-69% diameter reduction. Left Bulb/Prox ICA: Moderate amt of fibrous plaque slightly elevating velocities w/in the prox segment of the ICA. Consistent w/a 0-49% diameter reduction.   . Venous duplex  07/27/11    Venous Duplex 07/27/11 = Right lower extremity: no evidence of thrombus or  thrombophlebitis. Essentially normal right lower extremity venous duplex Doppler evaluation.  . Transvaginal tape (tvt) removal    . Cataract extraction w/ intraocular lens  implant, bilateral Bilateral   . Tonsillectomy    . Total abdominal hysterectomy  ~ 1967  . Femur im nail Left 01/15/2015    Procedure: INTRAMEDULLARY (IM) NAIL FEMORAL LEFT ;  Surgeon: Netta Cedars, MD;  Location: [place];  Service: Orthopedics;  Laterality: Left;   Social History:   reports that she has quit smoking. Her smoking use included Cigarettes. She has a 22.5 pack-year smoking history. She has never used smokeless tobacco. She reports that she drinks about 3.6 oz of alcohol per week. She reports that she does not use illicit drugs.  Family History  Problem Relation Age of Onset  . Cancer Mother     Skin  . Heart disease Mother   . Emphysema Father   . Heart disease Father   . Congestive Heart Failure Father   . Heart disease Brother   . Heart disease Brother   . Early death Sister     Medications:   Medication List       This list is accurate as of: 01/27/15  1:44 PM.  Always use your most recent med list.                 ALPRAZolam 0.25 MG tablet  Commonly known as:  XANAX  TAKE 1/2 TO 1 TABLET 2 TIMES A DAY AS NEEDED FOR ANXIETY     amiodarone 200 MG tablet  Commonly known as:  PACERONE  Take 0.5 tablets (100 mg total) by mouth daily. Please keep upcoming appointment     aspirin 81 MG chewable tablet  Chew 81 mg by mouth daily.     atorvastatin 40 MG tablet  Commonly known as:  LIPITOR  Take 1 tablet (40 mg total) by mouth every evening.     Azelastine HCl 0.15 % Soln  Place 1 spray into both nostrils 2 (two) times daily as needed (congestion).     baclofen 10 MG tablet  Commonly known as:  LIORESAL  Take 0.5 tablets (5 mg total) by mouth every 8 (eight) hours as needed for muscle spasms.     budesonide-formoterol 160-4.5 MCG/ACT inhaler  Commonly known as:  SYMBICORT  Inhale 1 puff into the lungs 2 (two) times daily.     feeding supplement (GLUCERNA SHAKE) Liqd  Take 237 mLs by mouth 2 (two) times daily between meals.     ferrous sulfate 325 (65 FE) MG tablet  Take 1 tablet (325 mg total) by mouth 3 (three) times daily after meals.     fluticasone 50 MCG/ACT nasal spray  Commonly known as:  FLONASE  Place 1 spray into both nostrils 2 (two) times daily.     furosemide 40 MG tablet  Commonly known as:  LASIX  Take 1 tablet (40 mg total) by mouth daily. May take extra dose if weight increases 3.5 pounds in 24 hrs as needed for fluid and edema.     glucosamine-chondroitin 500-400 MG tablet  Take 1 tablet by mouth 2 (two) times daily.     glucose blood test strip  Commonly known as:  RELION ULTIMA TEST  USE TO CHECK GLUCOSE UP TO 4 TIMES DAILY     HUMALOG KWIKPEN 100 UNIT/ML KiwkPen  Generic drug:  insulin lispro  Inject 0-8 Units into the skin 3 (three) times daily before meals. Per sliding scale  HYDROcodone-acetaminophen 5-325 MG per tablet  Commonly known as:  NORCO  Take 1 tablet by mouth every 6 (six) hours as needed for moderate pain.     insulin aspart  100 UNIT/ML injection  Commonly known as:  NOVOLOG  Inject 0 to 8 units up to 3 times a day prior to meal per sliding scale     insulin detemir 100 UNIT/ML injection  Commonly known as:  LEVEMIR  Inject 0.08 mLs (8 Units total) into the skin every morning.     Ipratropium-Albuterol 20-100 MCG/ACT Aers respimat  Commonly known as:  COMBIVENT  Inhale 1 puff into the lungs every 6 (six) hours as needed for wheezing or shortness of breath.     metoprolol succinate 25 MG 24 hr tablet  Commonly known as:  TOPROL-XL  TAKE 1/2 TABLET DAILY     nitroGLYCERIN 0.4 MG SL tablet  Commonly known as:  NITROSTAT  Place 1 tablet (0.4 mg total) under the tongue every 5 (five) minutes as needed for chest pain.     pantoprazole 40 MG tablet  Commonly known as:  PROTONIX  TAKE 1 TABLET TWICE A DAY     potassium chloride SA 20 MEQ tablet  Commonly known as:  K-DUR,KLOR-CON  Take 1 tablet (20 mEq total) by mouth daily.     PRESERVISION/LUTEIN Caps  Take 1 capsule by mouth daily.     senna-docusate 8.6-50 MG per tablet  Commonly known as:  Senokot-S  Take 1 tablet by mouth 2 (two) times daily.     simethicone 80 MG chewable tablet  Commonly known as:  MYLICON  Chew 80 mg by mouth at bedtime as needed for flatulence.     vitamin C 500 MG tablet  Commonly known as:  ASCORBIC ACID  Take 500 mg by mouth at bedtime.     VITAMIN D PO  Take 1 capsule by mouth daily.     warfarin 5 MG tablet  Commonly known as:  COUMADIN  TAKE (1) TABLET DAILY AFTER SUPPER.     warfarin 5 MG tablet  Commonly known as:  COUMADIN  Take 1 tablet (5 mg total) by mouth daily. Take as directed per the pharmacist for INR target per cardiology         Physical Exam: Filed Vitals:   01/27/15 1343  BP: 129/76  Pulse: 88  Temp: 97.6 F (36.4 C)  Resp: 18  Weight: 146 lb 6.4 oz (66.407 kg)  SpO2: 97%    General- elderly female, obese, in no acute distress Head- normocephalic, atraumatic Nose- normal nasal  mucosa, no maxillary or frontal sinus tenderness, no nasal discharge Throat- moist mucus membrane Eyes- no pallor, no icterus, no discharge, normal conjunctiva, normal sclera Neck- no cervical lymphadenopathy Cardiovascular- normal s1,s2, no murmurs, palpable dorsalis pedis and radial pulses, left trace leg edema Respiratory- bilateral clear to auscultation, on o2, no wheeze, no rhonchi, no crackles, no use of accessory muscles Abdomen- bowel sounds present, soft, non tender Musculoskeletal- able to move all 4 extremities, left leg movement restricted  Neurological- no focal deficit, alert and oriented to person, place and time Skin- warm and dry, left thigh incision with mild erythema and serangenous drainage, no signs of infection at present Psychiatry- appears anxious   Labs reviewed: Basic Metabolic Panel:  Recent Labs  01/21/15 0912 01/22/15 0528 01/23/15 0600  NA 141 140 140  K 3.6 4.4 4.2  CL 101 102 99*  CO2 33* 30 33*  GLUCOSE 124* 129* 103*  BUN 18 20 18   CREATININE 1.06* 1.09* 1.00  CALCIUM 8.6* 8.7* 8.8*  MG 1.9  --   --    Liver Function Tests:  Recent Labs  01/16/15 0302 01/17/15 1115 01/18/15 0955  AST 22 21 22   ALT 16 6* 9*  ALKPHOS 63 59 70  BILITOT 0.7 0.7 1.2  PROT 6.3* 5.7* 6.7  ALBUMIN 2.7* 2.1* 2.3*   No results for input(s): LIPASE, AMYLASE in the last 8760 hours. No results for input(s): AMMONIA in the last 8760 hours. CBC:  Recent Labs  11/04/14 1237  01/19/15 0325 01/21/15 0912 01/22/15 0528  WBC 6.5  < > 10.0 9.9 9.7  NEUTROABS 4.3  --   --   --   --   HGB  --   < > 9.1* 9.4* 8.9*  HCT 34.4  < > 28.5* 30.0* 28.6*  MCV  --   < > 96.9 99.0 98.6  PLT  --   < > 239 316 343  < > = values in this interval not displayed. Cardiac Enzymes:  Recent Labs  01/16/15 1832 01/17/15 0130 01/17/15 0445  TROPONINI 0.27* 0.29* 0.30*   BNP: Invalid input(s): POCBNP CBG:  Recent Labs  01/22/15 2346 01/23/15 0756 01/23/15 1239    GLUCAP 110* 100* 99    Radiological Exams: Ct Head Wo Contrast  01/14/2015   ADDENDUM REPORT: 01/14/2015 12:08  ADDENDUM: Study discussed by telephone with Dr. Reyne Dumas on 01/14/2015 at 1204 hours.   Electronically Signed   By: Genevie Ann M.D.   On: 01/14/2015 12:08   01/14/2015   CLINICAL DATA:  81 year old female who fell and was unconscious. Headache and cervical neck pain. Initial encounter.  EXAM: CT HEAD WITHOUT CONTRAST  CT CERVICAL SPINE WITHOUT CONTRAST  TECHNIQUE: Multidetector CT imaging of the head and cervical spine was performed following the standard protocol without intravenous contrast. Multiplanar CT image reconstructions of the cervical spine were also generated.  COMPARISON:  [facility] CT without contrast 718 2016, 01/08/2015. Cervical spine radiographs 09/05/2013. Cervical spine CT 07/10/2011.  FINDINGS: CT HEAD FINDINGS  Paranasal sinuses and mastoids appear stable and clear. Periapical left maxillary molar dental lucency appears inflammatory in nature. No calvarium fracture identified. No scalp hematoma. Orbits soft tissues appear stable.  Chronic Calcified atherosclerosis at the skull base. Cerebral volume is stable and normal for age. No ventriculomegaly. No midline shift, mass effect, or evidence of intracranial mass lesion. No acute intracranial hemorrhage identified. No evidence of cortically based acute infarction identified. Normal for age gray-white matter differentiation.  CT CERVICAL SPINE FINDINGS  Multilevel chronic cervical spondylolisthesis. Pronounced chronic anterolisthesis at C3-C4 appears stable since 2013. Mild anterolisthesis at C2-C3 and C7-T1 also appear stable. Associated chronic cervical facet arthropathy. Bilateral cervical posterior element alignment appears stable. Superimposed severe chronic lower cervical disc and endplate with chronic endplate osteophytosis. Degeneration occipital condyles to C1 alignment appears stable. Chronic C1-C2  degeneration may be mildly progressed since 2013. Stable C1-C2 alignment degenerative odontoid tip subchondral cysts do appear increased. No acute cervical spine fracture identified.  However, there is a small volume of abnormal prevertebral soft tissue fluid which is new. This is primarily about the C4 level (series 5, image 48).  Chronic calcified carotid atherosclerosis in the neck. Stable other noncontrast paraspinal soft tissues. Grossly intact visualized upper thoracic levels.  IMPRESSION: 1. New abnormal prevertebral fluid at the C4 level, suspicious for acute cervical anterior ligamentous injury in this setting. Cervical spine MRI without contrast  would evaluate further. 2. No acute fracture or listhesis identified in the cervical spine. Chronic advanced cervical spondylolisthesis and degenerative changes. 3. Stable and negative for age noncontrast CT appearance of the brain.  Electronically Signed: By: Genevie Ann M.D. On: 01/14/2015 11:54   Ct Cervical Spine Wo Contrast  01/14/2015   ADDENDUM REPORT: 01/14/2015 12:08  ADDENDUM: Study discussed by telephone with Dr. Reyne Dumas on 01/14/2015 at 1204 hours.   Electronically Signed   By: Genevie Ann M.D.   On: 01/14/2015 12:08   01/14/2015   CLINICAL DATA:  81 year old female who fell and was unconscious. Headache and cervical neck pain. Initial encounter.  EXAM: CT HEAD WITHOUT CONTRAST  CT CERVICAL SPINE WITHOUT CONTRAST  TECHNIQUE: Multidetector CT imaging of the head and cervical spine was performed following the standard protocol without intravenous contrast. Multiplanar CT image reconstructions of the cervical spine were also generated.  COMPARISON:  [facility] CT without contrast 718 2016, 01/08/2015. Cervical spine radiographs 09/05/2013. Cervical spine CT 07/10/2011.  FINDINGS: CT HEAD FINDINGS  Paranasal sinuses and mastoids appear stable and clear. Periapical left maxillary molar dental lucency appears inflammatory in nature. No  calvarium fracture identified. No scalp hematoma. Orbits soft tissues appear stable.  Chronic Calcified atherosclerosis at the skull base. Cerebral volume is stable and normal for age. No ventriculomegaly. No midline shift, mass effect, or evidence of intracranial mass lesion. No acute intracranial hemorrhage identified. No evidence of cortically based acute infarction identified. Normal for age gray-white matter differentiation.  CT CERVICAL SPINE FINDINGS  Multilevel chronic cervical spondylolisthesis. Pronounced chronic anterolisthesis at C3-C4 appears stable since 2013. Mild anterolisthesis at C2-C3 and C7-T1 also appear stable. Associated chronic cervical facet arthropathy. Bilateral cervical posterior element alignment appears stable. Superimposed severe chronic lower cervical disc and endplate with chronic endplate osteophytosis. Degeneration occipital condyles to C1 alignment appears stable. Chronic C1-C2 degeneration may be mildly progressed since 2013. Stable C1-C2 alignment degenerative odontoid tip subchondral cysts do appear increased. No acute cervical spine fracture identified.  However, there is a small volume of abnormal prevertebral soft tissue fluid which is new. This is primarily about the C4 level (series 5, image 48).  Chronic calcified carotid atherosclerosis in the neck. Stable other noncontrast paraspinal soft tissues. Grossly intact visualized upper thoracic levels.  IMPRESSION: 1. New abnormal prevertebral fluid at the C4 level, suspicious for acute cervical anterior ligamentous injury in this setting. Cervical spine MRI without contrast would evaluate further. 2. No acute fracture or listhesis identified in the cervical spine. Chronic advanced cervical spondylolisthesis and degenerative changes. 3. Stable and negative for age noncontrast CT appearance of the brain.  Electronically Signed: By: Genevie Ann M.D. On: 01/14/2015 11:54   Mr Cervical Spine Wo Contrast  01/14/2015   CLINICAL DATA:   Headaches and neck pain.  Fall 01/12/2015.  EXAM: MRI CERVICAL SPINE WITHOUT CONTRAST  TECHNIQUE: Multiplanar, multisequence MR imaging of the cervical spine was performed. No intravenous contrast was administered.  COMPARISON:  Cervical spine CT 01/14/2015 and MRI 07/22/2005  FINDINGS: Study is mildly to moderately motion degraded despite repeating some sequences.  There is mild prevertebral STIR hyperintensity at C3-4 compatible with edema as seen on CT earlier today. No frank disruption of the anterior or posterior longitudinal ligaments is identified. No posterior soft tissue edema suggestive of posterior ligamentous complex injury is identified. No vertebral marrow edema is seen to suggest acute osseous injury.  Vertebral alignment is unchanged from the 2007 MRI with grade 1 anterolisthesis  of C2 on C3, C3 on C4, and C7 on T1. Vertebral body heights are preserved. Moderate to severe disc space narrowing is present from C4-5 to C6-7. Craniocervical junction is unremarkable. Cervical spinal cord is normal in signal. 11 mm T2 hyperintense right thyroid nodule is noted.  C2-3: Advanced right and mild left facet arthrosis, mild disc uncovering, and infolding of the ligamentum flavum result in moderate spinal stenosis, increased from the prior MRI.  C3-4: Anterolisthesis with disc uncovering and advanced bilateral facet arthrosis result in mild spinal stenosis, slightly more prominent than on the prior MRI. There is at least moderate bilateral neural foraminal stenosis, increased from prior MRI.  C4-5: Broad-based posterior disc osteophyte complex and uncovertebral spurring result in mild spinal stenosis asymmetric to the right and moderate to severe right and moderate left neural foraminal stenosis, grossly similar to the prior MRI.  C5-6: Broad-based posterior disc osteophyte complex asymmetric to the right and uncovertebral spurring results in mild spinal stenosis in severe bilateral neural foraminal stenosis,  similar to the prior MRI.  C6-7: Broad-based posterior disc osteophyte complex and uncovertebral spurring result in mild spinal stenosis and moderate bilateral neural foraminal stenosis. The neural foraminal stenosis may have mildly progressed from the prior MRI.  C7-T1: Slight anterolisthesis and disc uncovering without significant stenosis, unchanged.  IMPRESSION: 1. Motion degraded examination. Mild prevertebral edema in the upper cervical spine without frank ligamentous disruption identified. 2. Multilevel cervical disc and facet degeneration as above, mildly progressed from the prior MRI.   Electronically Signed   By: Logan Bores   On: 01/14/2015 20:48   Chest Portable 1 View  01/13/2015   CLINICAL DATA:  Preop for left femur fracture  EXAM: PORTABLE CHEST - 1 VIEW  COMPARISON:  01/12/2015  FINDINGS: Moderate to severe cardiac enlargement. Calcified aortic arch. Mild vascular congestion. No evidence of consolidation or edema. Minimal right lower lobe scarring or atelectasis.  IMPRESSION: No acute findings. Stable cardiac enlargement with vascular congestion but no pulmonary edema.   Electronically Signed   By: Skipper Cliche M.D.   On: 01/13/2015 21:50     Assessment/Plan  Physical deconditioning Will have patient work with PT/OT as tolerated to regain strength and restore function.  Fall precautions are in place. Will order gel mattress to help prevent pressure sores with her limited mobility  Left intertrochanteric fracture Post fall. S/p ORIF. Will have her work with physical therapy and occupational therapy team to help with gait training and muscle strengthening exercises.fall precautions. Skin care. Encourage to be out of bed. Has follow up with orthopedics. Continue tylenol 650 mg q4h prn pain, norco 5-325 q6h prn pain, baclofen 5 mg q8h prn muscle spasm. Continue vitamin d supplement. Continue coumadin for dvt prophylaxis  Blood loss anemia Post op and with supratherapeutic inr. S/p  blood transfusion PRBC and FFP in hospital. Monitor h&h. Continue ferrous sulfate 325 mg tid   CHF Weight stable since arrival to facility, monitor weight, continue lasix 40 mg daily with additional 40 mg for weight gain of 3.5 lbs in 24 hrs. Continue metoprolol 25 mg daily. Continue kcl supplement. Monitor bmp  afib Rate controlled. Continue amiodarone 100 mg daily  supratherapeutic inr inr 5.2. Coumadin on hold, inr due 01/28/15. No signs of bleed at present. Monitor  Copd Breathing stable, continue her o2, continue symbicort and combivent  GAD On xanax 0.25 mg 1 tablet q12h prn  CAD Remains chest pain free. Continue aspirin, lipitor, prn NTG and metoprolol  gerd Stable,  continue protonix 40 mg bid  Loose stool Currently on senna s bid, d/c this and have her on colace 100 mg bid prn only for now.  Dm  Monitor cbg, continue levemir 8 u with SSI with meals for now.  Lab Results  Component Value Date   HGBA1C 7.3 11/04/2014    Goals of care: short term rehabilitation   Labs/tests ordered: cbc, inr, bmp  Family/ staff Communication: reviewed care plan with patient and nursing supervisor. Discussed about patient's concerns with nursing supervisor and DON. Concerns to be addressed.     Blanchie Serve, MD  [facility] [phone] (Monday-Friday 8 am - 5 pm) [phone] (afterhours)

## 2015-01-28 ENCOUNTER — Ambulatory Visit: Payer: Self-pay | Admitting: Cardiovascular Disease

## 2015-01-29 DIAGNOSIS — S72142D Displaced intertrochanteric fracture of left femur, subsequent encounter for closed fracture with routine healing: Secondary | ICD-10-CM | POA: Diagnosis not present

## 2015-01-29 DIAGNOSIS — Z4789 Encounter for other orthopedic aftercare: Secondary | ICD-10-CM | POA: Diagnosis not present

## 2015-02-09 ENCOUNTER — Ambulatory Visit: Payer: Self-pay | Admitting: Pulmonary Disease

## 2015-02-17 DIAGNOSIS — S72142D Displaced intertrochanteric fracture of left femur, subsequent encounter for closed fracture with routine healing: Secondary | ICD-10-CM | POA: Diagnosis not present

## 2015-02-17 DIAGNOSIS — Z4789 Encounter for other orthopedic aftercare: Secondary | ICD-10-CM | POA: Diagnosis not present

## 2015-02-27 ENCOUNTER — Non-Acute Institutional Stay (SKILLED_NURSING_FACILITY): Payer: Medicare Other | Admitting: Internal Medicine

## 2015-02-27 DIAGNOSIS — J9611 Chronic respiratory failure with hypoxia: Secondary | ICD-10-CM | POA: Diagnosis not present

## 2015-02-27 DIAGNOSIS — R300 Dysuria: Secondary | ICD-10-CM | POA: Diagnosis not present

## 2015-02-27 DIAGNOSIS — S72002D Fracture of unspecified part of neck of left femur, subsequent encounter for closed fracture with routine healing: Secondary | ICD-10-CM

## 2015-02-27 DIAGNOSIS — K5900 Constipation, unspecified: Secondary | ICD-10-CM | POA: Diagnosis not present

## 2015-02-27 DIAGNOSIS — N183 Chronic kidney disease, stage 3 (moderate): Secondary | ICD-10-CM | POA: Diagnosis not present

## 2015-02-27 DIAGNOSIS — K219 Gastro-esophageal reflux disease without esophagitis: Secondary | ICD-10-CM | POA: Diagnosis not present

## 2015-02-27 DIAGNOSIS — E1122 Type 2 diabetes mellitus with diabetic chronic kidney disease: Secondary | ICD-10-CM | POA: Diagnosis not present

## 2015-02-27 DIAGNOSIS — I509 Heart failure, unspecified: Secondary | ICD-10-CM | POA: Diagnosis not present

## 2015-02-27 DIAGNOSIS — D62 Acute posthemorrhagic anemia: Secondary | ICD-10-CM

## 2015-02-27 NOTE — Progress Notes (Signed)
Patient ID: [name], female   DOB: 12-Jun-1928, 81 y.o.   MRN: [id number]     Facility: [facility]    PCP: Redge Gainer, MD  Code Status: full code  Allergies  Allergen Reactions  . Atorvastatin Other (See Comments)     muscle aches  . Codeine Nausea And Vomiting  . Morphine Nausea And Vomiting  . Other Other (See Comments)    OPIATES cause nausea and vomiting  . Rosuvastatin Other (See Comments)    muscle aches at high doses, held as of 12/2010 due to aches  . Iohexol Other (See Comments)     Desc: unknown reaction; allergic to iodine and contrast     Chief Complaint  Patient presents with  . Medical Management of Chronic Issues     HPI:  81 y.o. patient is seen for routine visit. She complaints of feeling bloated and constipated. Her leg swelling has not subsided. Reviewed her cbg 96-136. She had some sore throat yesterdayay and gargled with salt and water and feels better today. Her reflux symptom is stable. She was recently treated for UTI and has completed her antibiotic course. The symptoms had resolved but she has been having burning and pain with urine for 2 days now. No other concerns. She is here for short term rehabilitation post hospital admission with left intertrochanteric fracture and acute respiratory failure in setting of chf exacerbation. She is status post ORIF and pain is under control. She is working wit therapy team.  Review of Systems:  Constitutional: Negative for fever, chills, diaphoresis.  HENT: Negative for headache, congestion, nasal discharge. Has hearing loss Eyes: Negative for eye pain, blurred vision, double vision and discharge.  Respiratory: Negative for cough and wheezing.  has chronic dyspnea and is on o2 Cardiovascular: Negative for chest pain, palpitations. Positive for leg swelling.  Gastrointestinal: Negative for heartburn, nausea, vomiting, abdominal pain. Appetite is good Genitourinary: Negative for  flank pain.  Musculoskeletal: Negative for back pain Skin: Negative for itching, rash.  Neurological: Negative for dizziness, tingling, focal weakness Psychiatric/Behavioral: Negative for depression   Past Medical History  Diagnosis Date  . Hyperlipidemia   . Hypertension   . Anxiety   . GERD (gastroesophageal reflux disease)   . CAD (coronary artery disease)     Cath in 2006 showed an occluded RCA with left-to-right collaterals & otherwise noncritical CAD with EF=25-30% at that time. EF subsequently improved to normal by 2D ECHO. Cath Aug. 2011 showed unchanged anatomy. > medical therapy recommended  . Diastolic dysfunction     Grade II  . RBBB (right bundle branch block)     Chronic  . Hypoxemic respiratory failure, chronic     uses 2.5 liters of oxygen with sleep  . COPD (chronic obstructive pulmonary disease)     GOLD 4 - PFT 06/08/10 FEV1 0.93 (62%), FEV1% 60, TLC 2.84 (69%0, DLCO 54%, +BD) On home O2.  . Secondary pulmonary hypertension   . Edema   . Angina   . Shortness of breath   . Bradycardia 08/24/2011  . Carotid artery disease     Moderate, right greater than left which we following by duplex ultrasound. [place] 12/05/11 = RIght Bulb/Prox ICA: Moderate to severe amt of fibrous plaque elevating velocities w/in prox segment of ICA. Consistent w/a 50-69% diameter reduction. Left Bulb/Prox ICA: Moderate amt of fibrous plaque slightly elevating velocities w/in the prox segment of the ICA. Consistent w/a 0-49% diameter reduction.   Marland Kitchen PAF (paroxysmal atrial  fibrillation)     In the past, on coumadin  . History of stress test 07/02/09    Mild perfusion defect seen in the Basal Inferior region(s). This is consistent with an infarct/scar.  Post-stress EF=56%. Global LV systolic function is normal.  EKG is negative for ischemia.  No significant iscemia detected. Low risk scan.  . Lower extremity edema     ECHO 07/21/12 = EF 55-60%, performed for TIA. Responding to diurectics. Continue  diuresis w/ a goal dry weight of <160lb. Venous Duplex 07/27/11 = Right lower extremity: no evidence of thrombus or thrombophlebitis. Essentially normal right lower extremity venous duplex Doppler evaluation.  . Carotid bruit   . Mitral valve regurgitation     Mild to moderate by ECHO 07/21/12  . Sleep-related hypoventilation   . COPD with emphysema   . Coronary atherosclerosis of unspecified type of vessel, native or graft   . PONV (postoperative nausea and vomiting)   . Family history of adverse reaction to anesthesia     "daughter gets PONV"  . CHF (congestive heart failure)     Hospitalized 08/22/11-08/26/11 with CHF secondary to diastolic dysfunction & hospitilized in 07/2012 with a similar problem. TTE 08/23/11 = normal EF.  Marland Kitchen Myocardial infarction     "she's had several" (01/13/2015)  . On home oxygen therapy     "?L; prn" (01/13/2015)  . Pneumonia ~ 2013  . Type II diabetes mellitus     goal A1C is 8, to avoid hypoglycemia  . Sleep apnea   . Stroke     "/CT scan; ~ 2014; didn't even know she'd had it"  (01/13/2015)  . Arthritis     "all over"  . Chronic lower back pain   . Bleeds easily   . Fall during current hospitalization     "was at [facility]; fell; transferred to [facility]" (01/13/2015)   Past Surgical History  Procedure Laterality Date  . Shoulder open rotator cuff repair Right 07/2005  . Cardiac catheterization  2006 & 2011    Cath in 2006 showed an occluded RCA with left-to-right collaterals & otherwise noncritical CAD with EF=25-30% at that time. EF subsequently improved to normal by 2D ECHO. Cath Aug. 2011 showed unchanged anatomy. > medical therapy recommended.  . Coronary angioplasty    . Coronary angioplasty with stent placement      x2  . Carotid duplex  12/05/11    Moderate, right greater than left which we following by duplex ultrasound. [place] 12/05/11 = RIght Bulb/Prox ICA: Moderate to severe amt of fibrous plaque elevating velocities w/in prox segment of ICA. Consistent  w/a 50-69% diameter reduction. Left Bulb/Prox ICA: Moderate amt of fibrous plaque slightly elevating velocities w/in the prox segment of the ICA. Consistent w/a 0-49% diameter reduction.   . Venous duplex  07/27/11    Venous Duplex 07/27/11 = Right lower extremity: no evidence of thrombus or thrombophlebitis. Essentially normal right lower extremity venous duplex Doppler evaluation.  . Transvaginal tape (tvt) removal    . Cataract extraction w/ intraocular lens  implant, bilateral Bilateral   . Tonsillectomy    . Total abdominal hysterectomy  ~ 1967  . Femur im nail Left 01/15/2015    Procedure: INTRAMEDULLARY (IM) NAIL FEMORAL LEFT ;  Surgeon: Netta Cedars, MD;  Location: [place];  Service: Orthopedics;  Laterality: Left;    Medications:   Medication List       This list is accurate as of: 02/27/15  5:26 PM.  Always use your most recent  med list.               ALPRAZolam 0.25 MG tablet  Commonly known as:  XANAX  TAKE 1/2 TO 1 TABLET 2 TIMES A DAY AS NEEDED FOR ANXIETY     amiodarone 200 MG tablet  Commonly known as:  PACERONE  Take 0.5 tablets (100 mg total) by mouth daily. Please keep upcoming appointment     atorvastatin 40 MG tablet  Commonly known as:  LIPITOR  Take 1 tablet (40 mg total) by mouth every evening.     Azelastine HCl 0.15 % Soln  Place 1 spray into both nostrils 2 (two) times daily as needed (congestion).     baclofen 10 MG tablet  Commonly known as:  LIORESAL  Take 0.5 tablets (5 mg total) by mouth every 8 (eight) hours as needed for muscle spasms.     budesonide-formoterol 160-4.5 MCG/ACT inhaler  Commonly known as:  SYMBICORT  Inhale 1 puff into the lungs 2 (two) times daily.     docusate sodium 100 MG capsule  Commonly known as:  COLACE  Take 100 mg by mouth 2 (two) times daily.     feeding supplement (GLUCERNA SHAKE) Liqd  Take 237 mLs by mouth 2 (two) times daily between meals.     ferrous sulfate 325 (65 FE) MG tablet  Take 1 tablet (325 mg  total) by mouth 3 (three) times daily after meals.     fluticasone 50 MCG/ACT nasal spray  Commonly known as:  FLONASE  Place 1 spray into both nostrils 2 (two) times daily.     furosemide 40 MG tablet  Commonly known as:  LASIX  Take 1 tablet (40 mg total) by mouth daily. May take extra dose if weight increases 3.5 pounds in 24 hrs as needed for fluid and edema.     glucosamine-chondroitin 500-400 MG tablet  Take 1 tablet by mouth 2 (two) times daily.     glucose blood test strip  Commonly known as:  RELION ULTIMA TEST  USE TO CHECK GLUCOSE UP TO 4 TIMES DAILY     HUMALOG KWIKPEN 100 UNIT/ML KiwkPen  Generic drug:  insulin lispro  Inject 0-8 Units into the skin 3 (three) times daily before meals. Per sliding scale     insulin aspart 100 UNIT/ML injection  Commonly known as:  NOVOLOG  Inject 0 to 8 units up to 3 times a day prior to meal per sliding scale     insulin detemir 100 UNIT/ML injection  Commonly known as:  LEVEMIR  Inject 0.08 mLs (8 Units total) into the skin every morning.     Ipratropium-Albuterol 20-100 MCG/ACT Aers respimat  Commonly known as:  COMBIVENT  Inhale 1 puff into the lungs every 6 (six) hours as needed for wheezing or shortness of breath.     metoprolol succinate 25 MG 24 hr tablet  Commonly known as:  TOPROL-XL  TAKE 1/2 TABLET DAILY     nitroGLYCERIN 0.4 MG SL tablet  Commonly known as:  NITROSTAT  Place 1 tablet (0.4 mg total) under the tongue every 5 (five) minutes as needed for chest pain.     pantoprazole 40 MG tablet  Commonly known as:  PROTONIX  TAKE 1 TABLET TWICE A DAY     potassium chloride SA 20 MEQ tablet  Commonly known as:  K-DUR,KLOR-CON  Take 1 tablet (20 mEq total) by mouth daily.     PRESERVISION/LUTEIN Caps  Take 1 capsule by mouth daily.  simethicone 80 MG chewable tablet  Commonly known as:  MYLICON  Chew 80 mg by mouth at bedtime as needed for flatulence.     vitamin C 500 MG tablet  Commonly known as:   ASCORBIC ACID  Take 500 mg by mouth at bedtime.     VITAMIN D PO  Take 1 capsule by mouth daily.     warfarin 5 MG tablet  Commonly known as:  COUMADIN  TAKE (1) TABLET DAILY AFTER SUPPER.     warfarin 5 MG tablet  Commonly known as:  COUMADIN  Take 1 tablet (5 mg total) by mouth daily. Take as directed per the pharmacist for INR target per cardiology         Physical Exam: Filed Vitals:   02/27/15 1713  BP: 131/69  Pulse: 70  Temp: 97.7 F (36.5 C)  Resp: 18  Weight: 142 lb 6.4 oz (64.592 kg)  SpO2: 98%   Wt Readings from Last 3 Encounters:  02/27/15 142 lb 6.4 oz (64.592 kg)  01/27/15 146 lb 6.4 oz (66.407 kg)  01/20/15 144 lb 13.5 oz (65.7 kg)   General- elderly female, obese, in no acute distress Head- normocephalic, atraumatic Nose- normal nasal mucosa, no maxillary or frontal sinus tenderness, no nasal discharge Throat- moist mucus membrane Eyes- no pallor, no icterus, no discharge, normal conjunctiva, normal sclera Neck- no cervical lymphadenopathy, no JVD Cardiovascular- normal s1,s2, no murmurs, palpable dorsalis pedis and radial pulses, bilateral 1+ leg edema Respiratory- bilateral clear to auscultation, on o2, no wheeze, no rhonchi, no crackles, no use of accessory muscles, on o2 Abdomen- bowel sounds present, soft, non tender Musculoskeletal- able to move all 4 extremities, left leg movement restricted, on wheelchair Neurological- no focal deficit, alert and oriented to person, place and time Skin- warm and dry Psychiatry- calm this visit   Labs reviewed: Basic Metabolic Panel:  Recent Labs  01/21/15 0912 01/22/15 0528 01/23/15 0600  NA 141 140 140  K 3.6 4.4 4.2  CL 101 102 99*  CO2 33* 30 33*  GLUCOSE 124* 129* 103*  BUN 18 20 18   CREATININE 1.06* 1.09* 1.00  CALCIUM 8.6* 8.7* 8.8*  MG 1.9  --   --    Liver Function Tests:  Recent Labs  01/16/15 0302 01/17/15 1115 01/18/15 0955  AST 22 21 22   ALT 16 6* 9*  ALKPHOS 63 59 70    BILITOT 0.7 0.7 1.2  PROT 6.3* 5.7* 6.7  ALBUMIN 2.7* 2.1* 2.3*   No results for input(s): LIPASE, AMYLASE in the last 8760 hours. No results for input(s): AMMONIA in the last 8760 hours. CBC:  Recent Labs  11/04/14 1237  01/19/15 0325 01/21/15 0912 01/22/15 0528  WBC 6.5  < > 10.0 9.9 9.7  NEUTROABS 4.3  --   --   --   --   HGB  --   < > 9.1* 9.4* 8.9*  HCT 34.4  < > 28.5* 30.0* 28.6*  MCV  --   < > 96.9 99.0 98.6  PLT  --   < > 239 316 343  < > = values in this interval not displayed. Cardiac Enzymes:  Recent Labs  01/16/15 1832 01/17/15 0130 01/17/15 0445  TROPONINI 0.27* 0.29* 0.30*   BNP: Invalid input(s): POCBNP CBG:  Recent Labs  01/22/15 2346 01/23/15 0756 01/23/15 1239  GLUCAP 110* 100* 99   01/28/15 wbc 2.82, hb 8.4, hct 26.3, plt 444, na 144, k 4.5, glu 108, bun 25, cr 1.18  02/25/15 a1c 5.9   Assessment/Plan  CHF On review has lost few lbs. Denies dyspnea. No crackles on exam. Has 1+ leg edema. Currently on metoprolol 25 mg daily with lasix 40 mg daily. Will change her lasix to 40 mg in am and 20 mg in pm x 3 days and then 40 mg daily. Continue daily weight check for now  Chronic respiratory failure On o2, as per pt at home was on o2 only at night, will have her weaned off o2 as tolerated to keep o2 sat > 90% with rest and > 88% with exertion. Continue bronchodilator treatment  Left intertrochanteric fracture S/p ORIF. Pain under control with tylenol and baclofen. Continue to work with therapy team for gait training and strengthening. Fall prevention. Continue coumadin for dvt prophylaxis  Anemia Likely from blood loss during surgery. Check cbc next lab to assess further and continue ferrous sulfate 325 mg tid for now  DM a1c 5.9. cbg s/o controlled DM. Decrease levemir to 5 u. D/ c SSI. Monitor cbg bid for now  Dysuria Send u/a with c/s, hydration encouraged, hold off on antibiotics until culture report is reviewed  Constipation On colace  100 mg bid prn. D/c this and have her on colace 100 mg bid. Hold off on starting senna s as this had caused her to have loose stool last time  afib Rate controlled. Continue metoprolol 25 mg daily, amiodarone 100 mg daily and coumadin with goal inr 2-3  gerd Stable, continue protonix 40 mg bid   Goals of care: short term rehabilitation   Labs/tests ordered: cbc, bmp    Blanchie Serve, MD  [facility] [phone] (Monday-Friday 8 am - 5 pm) [phone] (afterhours)

## 2015-03-09 ENCOUNTER — Non-Acute Institutional Stay (SKILLED_NURSING_FACILITY): Payer: Medicare Other | Admitting: Nurse Practitioner

## 2015-03-09 DIAGNOSIS — K5900 Constipation, unspecified: Secondary | ICD-10-CM

## 2015-03-09 DIAGNOSIS — N183 Chronic kidney disease, stage 3 unspecified: Secondary | ICD-10-CM

## 2015-03-09 DIAGNOSIS — D62 Acute posthemorrhagic anemia: Secondary | ICD-10-CM | POA: Diagnosis not present

## 2015-03-09 DIAGNOSIS — I509 Heart failure, unspecified: Secondary | ICD-10-CM | POA: Diagnosis not present

## 2015-03-09 DIAGNOSIS — R300 Dysuria: Secondary | ICD-10-CM

## 2015-03-09 DIAGNOSIS — I4891 Unspecified atrial fibrillation: Secondary | ICD-10-CM | POA: Diagnosis not present

## 2015-03-09 DIAGNOSIS — S72002D Fracture of unspecified part of neck of left femur, subsequent encounter for closed fracture with routine healing: Secondary | ICD-10-CM

## 2015-03-09 DIAGNOSIS — R5381 Other malaise: Secondary | ICD-10-CM

## 2015-03-09 DIAGNOSIS — E1122 Type 2 diabetes mellitus with diabetic chronic kidney disease: Secondary | ICD-10-CM | POA: Diagnosis not present

## 2015-03-09 MED ORDER — WARFARIN SODIUM 3 MG PO TABS: 3.0000 mg | ORAL_TABLET | Freq: Every day | ORAL | Status: DC

## 2015-03-09 MED ORDER — ATORVASTATIN CALCIUM 20 MG PO TABS: 20.0000 mg | ORAL_TABLET | Freq: Every evening | ORAL | Status: DC

## 2015-03-09 NOTE — Progress Notes (Signed)
Patient ID: [name], female   DOB: 03/08/1928, 81 y.o.   MRN: [id number]    Nursing Home Location:  [place] of Service: SNF (31)  PCP: Redge Gainer, MD  Allergies  Allergen Reactions  . Atorvastatin Other (See Comments)     muscle aches  . Codeine Nausea And Vomiting  . Morphine Nausea And Vomiting  . Other Other (See Comments)    OPIATES cause nausea and vomiting  . Rosuvastatin Other (See Comments)    muscle aches at high doses, held as of 12/2010 due to aches  . Iohexol Other (See Comments)     Desc: unknown reaction; allergic to iodine and contrast     Chief Complaint  Patient presents with  . Discharge Note    HPI:  Patient is a 81 y.o. female seen today at [facility] for discharge home. She is here for short term rehabilitation post hospital admission with left intertrochanteric fracture and acute respiratory failure in setting of chf exacerbation. She is status post ORIF and pain is under control. No constipation noted.  Patient currently doing well with therapy, now stable to discharge home with home health. Pt reports she is is still having LE edema however it has improved, reports it takes a long time to get the swelling completely down and would like to cont an increase in her lasix. No dysuria. No shortness of breath or chest pains.   Review of Systems:  Review of Systems  Constitutional: Negative for activity change, appetite change, fatigue and unexpected weight change.  HENT: Negative for congestion and hearing loss.   Eyes: Negative.   Respiratory: Negative for cough and shortness of breath.   Cardiovascular: Positive for leg swelling. Negative for chest pain and palpitations.  Gastrointestinal: Negative for abdominal pain, diarrhea and constipation.  Genitourinary: Negative for dysuria and difficulty urinating.  Musculoskeletal: Negative for myalgias and arthralgias.  Skin: Negative for color change and  wound.  Neurological: Negative for dizziness and weakness.  Psychiatric/Behavioral: Negative for behavioral problems, confusion and agitation.    Past Medical History  Diagnosis Date  . Hyperlipidemia   . Hypertension   . Anxiety   . GERD (gastroesophageal reflux disease)   . CAD (coronary artery disease)     Cath in 2006 showed an occluded RCA with left-to-right collaterals & otherwise noncritical CAD with EF=25-30% at that time. EF subsequently improved to normal by 2D ECHO. Cath Aug. 2011 showed unchanged anatomy. > medical therapy recommended  . Diastolic dysfunction     Grade II  . RBBB (right bundle branch block)     Chronic  . Hypoxemic respiratory failure, chronic     uses 2.5 liters of oxygen with sleep  . COPD (chronic obstructive pulmonary disease)     GOLD 4 - PFT 06/08/10 FEV1 0.93 (62%), FEV1% 60, TLC 2.84 (69%0, DLCO 54%, +BD) On home O2.  . Secondary pulmonary hypertension   . Edema   . Angina   . Shortness of breath   . Bradycardia 08/24/2011  . Carotid artery disease     Moderate, right greater than left which we following by duplex ultrasound. [place] 12/05/11 = RIght Bulb/Prox ICA: Moderate to severe amt of fibrous plaque elevating velocities w/in prox segment of ICA. Consistent w/a 50-69% diameter reduction. Left Bulb/Prox ICA: Moderate amt of fibrous plaque slightly elevating velocities w/in the prox segment of the ICA. Consistent w/a 0-49% diameter reduction.   Marland Kitchen PAF (paroxysmal atrial  fibrillation)     In the past, on coumadin  . History of stress test 07/02/09    Mild perfusion defect seen in the Basal Inferior region(s). This is consistent with an infarct/scar.  Post-stress EF=56%. Global LV systolic function is normal.  EKG is negative for ischemia.  No significant iscemia detected. Low risk scan.  . Lower extremity edema     ECHO 07/21/12 = EF 55-60%, performed for TIA. Responding to diurectics. Continue diuresis w/ a goal dry weight of <160lb. Venous Duplex 07/27/11  = Right lower extremity: no evidence of thrombus or thrombophlebitis. Essentially normal right lower extremity venous duplex Doppler evaluation.  . Carotid bruit   . Mitral valve regurgitation     Mild to moderate by ECHO 07/21/12  . Sleep-related hypoventilation   . COPD with emphysema   . Coronary atherosclerosis of unspecified type of vessel, native or graft   . PONV (postoperative nausea and vomiting)   . Family history of adverse reaction to anesthesia     "daughter gets PONV"  . CHF (congestive heart failure)     Hospitalized 08/22/11-08/26/11 with CHF secondary to diastolic dysfunction & hospitilized in 07/2012 with a similar problem. TTE 08/23/11 = normal EF.  Marland Kitchen Myocardial infarction     "she's had several" (01/13/2015)  . On home oxygen therapy     "?L; prn" (01/13/2015)  . Pneumonia ~ 2013  . Type II diabetes mellitus     goal A1C is 8, to avoid hypoglycemia  . Sleep apnea   . Stroke     "/CT scan; ~ 2014; didn't even know she'd had it"  (01/13/2015)  . Arthritis     "all over"  . Chronic lower back pain   . Bleeds easily   . Fall during current hospitalization     "was at [facility]; fell; transferred to [facility]" (01/13/2015)   Past Surgical History  Procedure Laterality Date  . Shoulder open rotator cuff repair Right 07/2005  . Cardiac catheterization  2006 & 2011    Cath in 2006 showed an occluded RCA with left-to-right collaterals & otherwise noncritical CAD with EF=25-30% at that time. EF subsequently improved to normal by 2D ECHO. Cath Aug. 2011 showed unchanged anatomy. > medical therapy recommended.  . Coronary angioplasty    . Coronary angioplasty with stent placement      x2  . Carotid duplex  12/05/11    Moderate, right greater than left which we following by duplex ultrasound. [place] 12/05/11 = RIght Bulb/Prox ICA: Moderate to severe amt of fibrous plaque elevating velocities w/in prox segment of ICA. Consistent w/a 50-69% diameter reduction. Left Bulb/Prox ICA: Moderate amt  of fibrous plaque slightly elevating velocities w/in the prox segment of the ICA. Consistent w/a 0-49% diameter reduction.   . Venous duplex  07/27/11    Venous Duplex 07/27/11 = Right lower extremity: no evidence of thrombus or thrombophlebitis. Essentially normal right lower extremity venous duplex Doppler evaluation.  . Transvaginal tape (tvt) removal    . Cataract extraction w/ intraocular lens  implant, bilateral Bilateral   . Tonsillectomy    . Total abdominal hysterectomy  ~ 1967  . Femur im nail Left 01/15/2015    Procedure: INTRAMEDULLARY (IM) NAIL FEMORAL LEFT ;  Surgeon: Netta Cedars, MD;  Location: [place];  Service: Orthopedics;  Laterality: Left;   Social History:   reports that she has quit smoking. Her smoking use included Cigarettes. She has a 22.5 pack-year smoking history. She has never used smokeless tobacco.  She reports that she drinks about 3.6 oz of alcohol per week. She reports that she does not use illicit drugs.  Family History  Problem Relation Age of Onset  . Cancer Mother     Skin  . Heart disease Mother   . Emphysema Father   . Heart disease Father   . Congestive Heart Failure Father   . Heart disease Brother   . Heart disease Brother   . Early death Sister     Medications: Patient's Medications  New Prescriptions   No medications on file  Previous Medications   ALPRAZOLAM (XANAX) 0.25 MG TABLET    TAKE 1/2 TO 1 TABLET 2 TIMES A DAY AS NEEDED FOR ANXIETY   AMIODARONE (PACERONE) 200 MG TABLET    Take 0.5 tablets (100 mg total) by mouth daily. Please keep upcoming appointment   AZELASTINE HCL 0.15 % SOLN    Place 1 spray into both nostrils 2 (two) times daily as needed (congestion).   BACLOFEN (LIORESAL) 10 MG TABLET    Take 0.5 tablets (5 mg total) by mouth every 8 (eight) hours as needed for muscle spasms.   BUDESONIDE-FORMOTEROL (SYMBICORT) 160-4.5 MCG/ACT INHALER    Inhale 1 puff into the lungs 2 (two) times daily.   CHOLECALCIFEROL (VITAMIN D PO)     Take 1 capsule by mouth daily.    DOCUSATE SODIUM (COLACE) 100 MG CAPSULE    Take 100 mg by mouth 2 (two) times daily.   FEEDING SUPPLEMENT, GLUCERNA SHAKE, (GLUCERNA SHAKE) LIQD    Take 237 mLs by mouth 2 (two) times daily between meals.   FERROUS SULFATE 325 (65 FE) MG TABLET    Take 1 tablet (325 mg total) by mouth 3 (three) times daily after meals.   FLUTICASONE (FLONASE) 50 MCG/ACT NASAL SPRAY    Place 1 spray into both nostrils 2 (two) times daily.   FUROSEMIDE (LASIX) 40 MG TABLET    Take 1 tablet (40 mg total) by mouth daily. May take extra dose if weight increases 3.5 pounds in 24 hrs as needed for fluid and edema.   GLUCOSAMINE-CHONDROITIN 500-400 MG TABLET    Take 1 tablet by mouth 2 (two) times daily.   GLUCOSE BLOOD (RELION ULTIMA TEST) TEST STRIP    USE TO CHECK GLUCOSE UP TO 4 TIMES DAILY   INSULIN ASPART (NOVOLOG) 100 UNIT/ML INJECTION    Inject 0 to 8 units up to 3 times a day prior to meal per sliding scale   INSULIN DETEMIR (LEVEMIR) 100 UNIT/ML INJECTION    Inject 5 Units into the skin at bedtime.   IPRATROPIUM-ALBUTEROL (COMBIVENT) 20-100 MCG/ACT AERS RESPIMAT    Inhale 1 puff into the lungs every 6 (six) hours as needed for wheezing or shortness of breath.   METOPROLOL SUCCINATE (TOPROL-XL) 25 MG 24 HR TABLET    TAKE 1/2 TABLET DAILY   MULTIPLE VITAMINS-MINERALS (PRESERVISION/LUTEIN) CAPS    Take 1 capsule by mouth daily.    NITROGLYCERIN (NITROSTAT) 0.4 MG SL TABLET    Place 1 tablet (0.4 mg total) under the tongue every 5 (five) minutes as needed for chest pain.   PANTOPRAZOLE (PROTONIX) 40 MG TABLET    TAKE 1 TABLET TWICE A DAY   POTASSIUM CHLORIDE SA (K-DUR,KLOR-CON) 20 MEQ TABLET    Take 1 tablet (20 mEq total) by mouth daily.   SIMETHICONE (MYLICON) 80 MG CHEWABLE TABLET    Chew 80 mg by mouth at bedtime as needed for flatulence.   VITAMIN C (ASCORBIC  ACID) 500 MG TABLET    Take 500 mg by mouth at bedtime.   Modified Medications   Modified Medication Previous Medication    ATORVASTATIN (LIPITOR) 20 MG TABLET atorvastatin (LIPITOR) 40 MG tablet      Take 1 tablet (20 mg total) by mouth every evening.    Take 1 tablet (40 mg total) by mouth every evening.   WARFARIN (COUMADIN) 3 MG TABLET warfarin (COUMADIN) 5 MG tablet      Take 1 tablet (3 mg total) by mouth daily at 6 PM.    TAKE (1) TABLET DAILY AFTER SUPPER.  Discontinued Medications   INSULIN DETEMIR (LEVEMIR) 100 UNIT/ML INJECTION    Inject 0.08 mLs (8 Units total) into the skin every morning.   INSULIN LISPRO (HUMALOG KWIKPEN) 100 UNIT/ML KIWKPEN    Inject 0-8 Units into the skin 3 (three) times daily before meals. Per sliding scale   WARFARIN (COUMADIN) 5 MG TABLET    Take 1 tablet (5 mg total) by mouth daily. Take as directed per the pharmacist for INR target per cardiology     Physical Exam: Filed Vitals:   03/09/15 1148  BP: 146/92  Pulse: 72  Temp: 98.7 F (37.1 C)  Resp: 20  Weight: 144 lb (65.318 kg)    Physical Exam  Constitutional: She is oriented to person, place, and time. She appears well-developed and well-nourished. No distress.  HENT:  Head: Normocephalic and atraumatic.  Mouth/Throat: Oropharynx is clear and moist. No oropharyngeal exudate.  Eyes: Conjunctivae are normal. Pupils are equal, round, and reactive to light.  Neck: Normal range of motion. Neck supple.  Cardiovascular: Normal rate, regular rhythm and normal heart sounds.   Pulmonary/Chest: Effort normal and breath sounds normal.  Abdominal: Soft. Bowel sounds are normal.  Musculoskeletal: She exhibits no tenderness. Edema: +1 pitting edema bilaterally.  Neurological: She is alert and oriented to person, place, and time.  Skin: Skin is warm and dry. She is not diaphoretic.  Healed incision to left hip   Psychiatric: She has a normal mood and affect.    Labs reviewed: Basic Metabolic Panel:  Recent Labs  01/21/15 0912 01/22/15 0528 01/23/15 0600  NA 141 140 140  K 3.6 4.4 4.2  CL 101 102 99*  CO2 33* 30  33*  GLUCOSE 124* 129* 103*  BUN 18 20 18   CREATININE 1.06* 1.09* 1.00  CALCIUM 8.6* 8.7* 8.8*  MG 1.9  --   --    Liver Function Tests:  Recent Labs  01/16/15 0302 01/17/15 1115 01/18/15 0955  AST 22 21 22   ALT 16 6* 9*  ALKPHOS 63 59 70  BILITOT 0.7 0.7 1.2  PROT 6.3* 5.7* 6.7  ALBUMIN 2.7* 2.1* 2.3*   No results for input(s): LIPASE, AMYLASE in the last 8760 hours. No results for input(s): AMMONIA in the last 8760 hours. CBC:  Recent Labs  11/04/14 1237  01/19/15 0325 01/21/15 0912 01/22/15 0528  WBC 6.5  < > 10.0 9.9 9.7  NEUTROABS 4.3  --   --   --   --   HGB  --   < > 9.1* 9.4* 8.9*  HCT 34.4  < > 28.5* 30.0* 28.6*  MCV  --   < > 96.9 99.0 98.6  PLT  --   < > 239 316 343  < > = values in this interval not displayed. TSH: No results for input(s): TSH in the last 8760 hours. A1C: Lab Results  Component Value Date  HGBA1C 7.3 11/04/2014   Lipid Panel:  Recent Labs  06/12/14 1224 11/04/14 1210  CHOL 150 163  HDL 80 100  LDLCALC 51 48  TRIG 96 74  CHOLHDL 1.9 1.6   CBC (without Differential) Final 03/03/2015 4:27PM TEST RESULT REF RANGE UNIT White Blood Cell (WBC) 5.8 3.8-10.8 k/uL Red Blood Cell (RBC) L 3.0 3.5-5.5 M/uL Hemoglobin (HGB) L 9.3 11.4-16.0 g/dL Hematocrit (HCT) L 32.1 33.0-48.0 % Mean Corpuscular Volume (MCV) H 107.0 79.0-100.0 fL Mean Corpuscular HGB (MCH) 31.0 26.8-33.2 pg Mean Corpuscular HGB Conc (MCHC) L 29.0 30.5-36.0 g/dL RBC Dist Width (RDW) H 16.8 9.0-15.0 % Platelet 322 150-450 k/uL Basic Metabolic Panel Final 00/17/4944 2:29PM TEST RESULT REF RANGE UNIT SODIUM 146 135-146 mmol/L POTASSIUM 3.9 3.5-5.3 mmol/L CHLORIDE 104 95-109 mmol/L CO2 H 34.2 21.0-31.0 mmol/L Anion Gap 8 5-21 meq/L GLUCOSE H 115 70-105 mg/dL UREA NITROGEN (BUN) 22 5-25 mg/dL Creatinine, Serum 1.17 0.60-1.30 mg/dL BUN/CREATININE RATIO 18.8 6.0-25.0 Ratio CALCIUM 9.1 8.2-10.5 mg/dL Glomerular Filtration Rate L 46.73 >60.00 mls/min/1.73  m2 Glomerular Filtration Rate - African American L 56.54 >60.00 mls/min/1.73 m2 Assessment/Plan  1. Hip fracture, left, closed, with routine healing, subsequent encounter S/p ORIF. Pain under control. Continue coumadin for dvt prophylaxis  2. Congestive heart failure, unspecified congestive heart failure chronicity, unspecified congestive heart failure type Weight has been stable, no worsening shortness of breath or chest pains, noted LE edema but pt reports overall this has actually improved. Lasix was increased from 40 mg daily to 40 mg in am and 20 mg in pm for 3 days but did not improved edema, will cont this for 7 more days then pt to resume previous dosing of 40 mg daily. Pt to elevate LE during the day as tolerates.   3. DM type 2 causing CKD stage 3 -diabetes well controlled. SSI was stopped and cont on levemir 5 units only.   4. Dysuria Resolved, no UTI  5. Constipation, unspecified constipation type Stable, without complaints.   6. Acute blood loss anemia hgb stable, to cont iron TID  7. Atrial fibrillation, unspecified -rate controlled on current regimen, INR 1.9 on 3 mg, to cont 3 mg coumadin and to have INR checked on Friday.   8. Physical deconditioning -has improved, still needing help and will require some care when she goes home. Has caregivers set up to come to her house. pt is stable for discharge-will need PT/OT/Nursing per home health. DME needed for WC. Rx written.  will need to follow up with PCP within 2 weeks.     Carlos American. Harle Battiest  [facility] [phone] 8 am - 5 pm) [phone] (after hours)

## 2015-03-11 ENCOUNTER — Other Ambulatory Visit: Payer: Self-pay

## 2015-03-11 DIAGNOSIS — Z794 Long term (current) use of insulin: Secondary | ICD-10-CM | POA: Diagnosis not present

## 2015-03-11 DIAGNOSIS — I509 Heart failure, unspecified: Secondary | ICD-10-CM | POA: Diagnosis not present

## 2015-03-11 DIAGNOSIS — E119 Type 2 diabetes mellitus without complications: Secondary | ICD-10-CM | POA: Diagnosis not present

## 2015-03-11 DIAGNOSIS — Z7901 Long term (current) use of anticoagulants: Secondary | ICD-10-CM | POA: Diagnosis not present

## 2015-03-11 DIAGNOSIS — Z9181 History of falling: Secondary | ICD-10-CM | POA: Diagnosis not present

## 2015-03-11 DIAGNOSIS — Z9981 Dependence on supplemental oxygen: Secondary | ICD-10-CM | POA: Diagnosis not present

## 2015-03-11 DIAGNOSIS — S72112D Displaced fracture of greater trochanter of left femur, subsequent encounter for closed fracture with routine healing: Secondary | ICD-10-CM | POA: Diagnosis not present

## 2015-03-11 DIAGNOSIS — Z5181 Encounter for therapeutic drug level monitoring: Secondary | ICD-10-CM | POA: Diagnosis not present

## 2015-03-11 DIAGNOSIS — M62838 Other muscle spasm: Secondary | ICD-10-CM | POA: Diagnosis not present

## 2015-03-11 DIAGNOSIS — I4891 Unspecified atrial fibrillation: Secondary | ICD-10-CM | POA: Diagnosis not present

## 2015-03-11 MED ORDER — ATORVASTATIN CALCIUM 20 MG PO TABS: 20.0000 mg | ORAL_TABLET | Freq: Every evening | ORAL | Status: DC

## 2015-03-13 ENCOUNTER — Ambulatory Visit (INDEPENDENT_AMBULATORY_CARE_PROVIDER_SITE_OTHER): Payer: Medicare Other | Admitting: Pharmacist

## 2015-03-13 ENCOUNTER — Telehealth: Payer: Self-pay | Admitting: *Deleted

## 2015-03-13 DIAGNOSIS — I509 Heart failure, unspecified: Secondary | ICD-10-CM | POA: Diagnosis not present

## 2015-03-13 DIAGNOSIS — Z7901 Long term (current) use of anticoagulants: Secondary | ICD-10-CM | POA: Diagnosis not present

## 2015-03-13 DIAGNOSIS — I4891 Unspecified atrial fibrillation: Secondary | ICD-10-CM | POA: Diagnosis not present

## 2015-03-13 DIAGNOSIS — M62838 Other muscle spasm: Secondary | ICD-10-CM | POA: Diagnosis not present

## 2015-03-13 DIAGNOSIS — Z9181 History of falling: Secondary | ICD-10-CM | POA: Diagnosis not present

## 2015-03-13 DIAGNOSIS — E119 Type 2 diabetes mellitus without complications: Secondary | ICD-10-CM | POA: Diagnosis not present

## 2015-03-13 DIAGNOSIS — Z794 Long term (current) use of insulin: Secondary | ICD-10-CM | POA: Diagnosis not present

## 2015-03-13 DIAGNOSIS — S72112D Displaced fracture of greater trochanter of left femur, subsequent encounter for closed fracture with routine healing: Secondary | ICD-10-CM | POA: Diagnosis not present

## 2015-03-13 DIAGNOSIS — Z9981 Dependence on supplemental oxygen: Secondary | ICD-10-CM | POA: Diagnosis not present

## 2015-03-13 DIAGNOSIS — Z5181 Encounter for therapeutic drug level monitoring: Secondary | ICD-10-CM | POA: Diagnosis not present

## 2015-03-13 LAB — POCT INR: INR: 1.5

## 2015-03-13 NOTE — Telephone Encounter (Signed)
Recommend take 7.5mg  today = 1 and 1/2 tablets of warfarin 5mg . Then restart warfarin 5mg  1 tablet daily. Recheck INR next week 03/18/15.  Maria with [facility] notified and patient notified.

## 2015-03-13 NOTE — Telephone Encounter (Signed)
Per cal from [facility] INR 1.5 PT  18.5  Pt is currently taking Warfarin 3mg  1.5 tablets daily Per nurse, she was supposed to be taking Warfarin 5mg  1.5 tablets daily but got RX bottles mixed up  Please call to advise

## 2015-03-16 ENCOUNTER — Telehealth: Payer: Self-pay | Admitting: Family Medicine

## 2015-03-16 NOTE — Telephone Encounter (Signed)
Patient was called but she had already located the number for [place].

## 2015-03-17 DIAGNOSIS — M62838 Other muscle spasm: Secondary | ICD-10-CM | POA: Diagnosis not present

## 2015-03-17 DIAGNOSIS — Z7901 Long term (current) use of anticoagulants: Secondary | ICD-10-CM | POA: Diagnosis not present

## 2015-03-17 DIAGNOSIS — Z5181 Encounter for therapeutic drug level monitoring: Secondary | ICD-10-CM | POA: Diagnosis not present

## 2015-03-17 DIAGNOSIS — E119 Type 2 diabetes mellitus without complications: Secondary | ICD-10-CM | POA: Diagnosis not present

## 2015-03-17 DIAGNOSIS — S72112D Displaced fracture of greater trochanter of left femur, subsequent encounter for closed fracture with routine healing: Secondary | ICD-10-CM | POA: Diagnosis not present

## 2015-03-17 DIAGNOSIS — I509 Heart failure, unspecified: Secondary | ICD-10-CM | POA: Diagnosis not present

## 2015-03-17 DIAGNOSIS — Z9981 Dependence on supplemental oxygen: Secondary | ICD-10-CM | POA: Diagnosis not present

## 2015-03-17 DIAGNOSIS — Z794 Long term (current) use of insulin: Secondary | ICD-10-CM | POA: Diagnosis not present

## 2015-03-17 DIAGNOSIS — Z9181 History of falling: Secondary | ICD-10-CM | POA: Diagnosis not present

## 2015-03-17 DIAGNOSIS — I4891 Unspecified atrial fibrillation: Secondary | ICD-10-CM | POA: Diagnosis not present

## 2015-03-17 DIAGNOSIS — S72142D Displaced intertrochanteric fracture of left femur, subsequent encounter for closed fracture with routine healing: Secondary | ICD-10-CM | POA: Diagnosis not present

## 2015-03-18 DIAGNOSIS — S72112D Displaced fracture of greater trochanter of left femur, subsequent encounter for closed fracture with routine healing: Secondary | ICD-10-CM | POA: Diagnosis not present

## 2015-03-18 DIAGNOSIS — Z9981 Dependence on supplemental oxygen: Secondary | ICD-10-CM | POA: Diagnosis not present

## 2015-03-18 DIAGNOSIS — E119 Type 2 diabetes mellitus without complications: Secondary | ICD-10-CM | POA: Diagnosis not present

## 2015-03-18 DIAGNOSIS — I4891 Unspecified atrial fibrillation: Secondary | ICD-10-CM | POA: Diagnosis not present

## 2015-03-18 DIAGNOSIS — Z9181 History of falling: Secondary | ICD-10-CM | POA: Diagnosis not present

## 2015-03-18 DIAGNOSIS — Z7901 Long term (current) use of anticoagulants: Secondary | ICD-10-CM | POA: Diagnosis not present

## 2015-03-18 DIAGNOSIS — Z5181 Encounter for therapeutic drug level monitoring: Secondary | ICD-10-CM | POA: Diagnosis not present

## 2015-03-18 DIAGNOSIS — I509 Heart failure, unspecified: Secondary | ICD-10-CM | POA: Diagnosis not present

## 2015-03-18 DIAGNOSIS — M62838 Other muscle spasm: Secondary | ICD-10-CM | POA: Diagnosis not present

## 2015-03-18 DIAGNOSIS — Z794 Long term (current) use of insulin: Secondary | ICD-10-CM | POA: Diagnosis not present

## 2015-03-19 ENCOUNTER — Telehealth: Payer: Self-pay | Admitting: *Deleted

## 2015-03-19 ENCOUNTER — Encounter: Payer: Self-pay | Admitting: Family Medicine

## 2015-03-19 ENCOUNTER — Ambulatory Visit (INDEPENDENT_AMBULATORY_CARE_PROVIDER_SITE_OTHER): Payer: Medicare Other | Admitting: Family Medicine

## 2015-03-19 ENCOUNTER — Ambulatory Visit (INDEPENDENT_AMBULATORY_CARE_PROVIDER_SITE_OTHER): Payer: Medicare Other | Admitting: Pharmacist

## 2015-03-19 VITALS — BP 101/55 | HR 72 | Temp 98.2°F | Ht 61.0 in | Wt 138.0 lb

## 2015-03-19 DIAGNOSIS — I480 Paroxysmal atrial fibrillation: Secondary | ICD-10-CM

## 2015-03-19 DIAGNOSIS — L03115 Cellulitis of right lower limb: Secondary | ICD-10-CM | POA: Diagnosis not present

## 2015-03-19 LAB — POCT INR
INR: 2.5
INR: 2.7
INR: 2.7
INR: 2.7
INR: 2.7
INR: 2.7
INR: 2.7
INR: 2.7

## 2015-03-19 MED ORDER — SULFAMETHOXAZOLE-TRIMETHOPRIM 800-160 MG PO TABS: 1.0000 | ORAL_TABLET | Freq: Two times a day (BID) | ORAL | Status: DC

## 2015-03-19 NOTE — Telephone Encounter (Signed)
Protime 32.2  INR 2.7

## 2015-03-19 NOTE — Progress Notes (Signed)
BP 101/55 mmHg  Pulse 72  Temp(Src) 98.2 F (36.8 C) (Oral)  Ht 5\' 1"  (1.549 m)  Wt 138 lb (62.596 kg)  BMI 26.09 kg/m2   Subjective:    Patient ID: [name], female    DOB: [birth year], 81 y.o.   MRN: [id number]  HPI: [name] is a 81 y.o. female presenting on 03/19/2015 for Nosebleeds and Redness and Edema in legs   HPI Skin infection and wound Patient has had swelling for some time because of her CHF but now she is starting to have redness and warmth and an open wound on her right lower extremity. This is been going on for the past month but is worse and most recently. She takes Lasix for her swelling which helped some but not completely successful. She also has significant tenderness throughout the lower extremity. She has a small wound on her right lateral lower leg that has been draining thick purulent drainage. She has recently been in the hospital for a left hip fracture and then went to rehabilitation facility for a month after that for that issue. The pain associated in the right lower extremity from the swelling is 3 out of 10.  Nosebleeds Patient has been having intermittent nosebleeds over the past month. None of her nosebleeds have been persistent or blood for very long but she was just concerned about her Coumadin levels because of those nosebleeds. She is currently taking 5 mg every night. We get an INR checked today and it came back at 2.5.  Relevant past medical, surgical, family and social history reviewed and updated as indicated. Interim medical history since our last visit reviewed. Allergies and medications reviewed and updated.  Review of Systems  Constitutional: Negative for fever and chills.  HENT: Positive for nosebleeds. Negative for congestion, ear discharge and ear pain.   Eyes: Negative for redness and visual disturbance.  Respiratory: Negative for chest tightness and shortness of breath.   Cardiovascular: Negative for chest pain and leg  swelling.  Genitourinary: Negative for dysuria and difficulty urinating.  Musculoskeletal: Negative for back pain and gait problem.  Skin: Positive for color change and wound. Negative for rash.  Neurological: Negative for light-headedness and headaches.  Psychiatric/Behavioral: Negative for behavioral problems and agitation.  All other systems reviewed and are negative.   Per HPI unless specifically indicated above     Medication List       This list is accurate as of: 03/19/15  1:19 PM.  Always use your most recent med list.               ALPRAZolam 0.25 MG tablet  Commonly known as:  XANAX  TAKE 1/2 TO 1 TABLET 2 TIMES A DAY AS NEEDED FOR ANXIETY     amiodarone 200 MG tablet  Commonly known as:  PACERONE  Take 0.5 tablets (100 mg total) by mouth daily. Please keep upcoming appointment     atorvastatin 20 MG tablet  Commonly known as:  LIPITOR  Take 1 tablet (20 mg total) by mouth every evening.     Azelastine HCl 0.15 % Soln  Place 1 spray into both nostrils 2 (two) times daily as needed (congestion).     baclofen 10 MG tablet  Commonly known as:  LIORESAL  Take 0.5 tablets (5 mg total) by mouth every 8 (eight) hours as needed for muscle spasms.     budesonide-formoterol 160-4.5 MCG/ACT inhaler  Commonly known as:  SYMBICORT  Inhale 1 puff into the  lungs 2 (two) times daily.     docusate sodium 100 MG capsule  Commonly known as:  COLACE  Take 100 mg by mouth 2 (two) times daily.     feeding supplement (GLUCERNA SHAKE) Liqd  Take 237 mLs by mouth 2 (two) times daily between meals.     ferrous sulfate 325 (65 FE) MG tablet  Take 1 tablet (325 mg total) by mouth 3 (three) times daily after meals.     fluticasone 50 MCG/ACT nasal spray  Commonly known as:  FLONASE  Place 1 spray into both nostrils 2 (two) times daily.     furosemide 40 MG tablet  Commonly known as:  LASIX  Take 1 tablet (40 mg total) by mouth daily. May take extra dose if weight increases  3.5 pounds in 24 hrs as needed for fluid and edema.     glucosamine-chondroitin 500-400 MG tablet  Take 1 tablet by mouth 2 (two) times daily.     glucose blood test strip  Commonly known as:  RELION ULTIMA TEST  USE TO CHECK GLUCOSE UP TO 4 TIMES DAILY     insulin aspart 100 UNIT/ML injection  Commonly known as:  NOVOLOG  Inject 0 to 8 units up to 3 times a day prior to meal per sliding scale     insulin detemir 100 UNIT/ML injection  Commonly known as:  LEVEMIR  Inject 5 Units into the skin at bedtime.     Ipratropium-Albuterol 20-100 MCG/ACT Aers respimat  Commonly known as:  COMBIVENT  Inhale 1 puff into the lungs every 6 (six) hours as needed for wheezing or shortness of breath.     metoprolol succinate 25 MG 24 hr tablet  Commonly known as:  TOPROL-XL  TAKE 1/2 TABLET DAILY     nitroGLYCERIN 0.4 MG SL tablet  Commonly known as:  NITROSTAT  Place 1 tablet (0.4 mg total) under the tongue every 5 (five) minutes as needed for chest pain.     pantoprazole 40 MG tablet  Commonly known as:  PROTONIX  TAKE 1 TABLET TWICE A DAY     potassium chloride SA 20 MEQ tablet  Commonly known as:  K-DUR,KLOR-CON  Take 1 tablet (20 mEq total) by mouth daily.     PRESERVISION/LUTEIN Caps  Take 1 capsule by mouth daily.     simethicone 80 MG chewable tablet  Commonly known as:  MYLICON  Chew 80 mg by mouth at bedtime as needed for flatulence.     sulfamethoxazole-trimethoprim 800-160 MG per tablet  Commonly known as:  BACTRIM DS  Take 1 tablet by mouth 2 (two) times daily.     vitamin C 500 MG tablet  Commonly known as:  ASCORBIC ACID  Take 500 mg by mouth at bedtime.     VITAMIN D PO  Take 1 capsule by mouth daily.     warfarin 3 MG tablet  Commonly known as:  COUMADIN  Take 1 tablet (3 mg total) by mouth daily at 6 PM.           Objective:    BP 101/55 mmHg  Pulse 72  Temp(Src) 98.2 F (36.8 C) (Oral)  Ht 5\' 1"  (1.549 m)  Wt 138 lb (62.596 kg)  BMI 26.09 kg/m2   Wt Readings from Last 3 Encounters:  03/19/15 138 lb (62.596 kg)  03/09/15 144 lb (65.318 kg)  02/27/15 142 lb 6.4 oz (64.592 kg)    Physical Exam  Constitutional: She is oriented to person, place, and  time. She appears well-developed and well-nourished. No distress.  HENT:  Right Ear: External ear normal.  Left Ear: External ear normal.  Nose: Nose normal.  Mouth/Throat: Oropharynx is clear and moist.  Eyes: Conjunctivae and EOM are normal. Pupils are equal, round, and reactive to light.  Cardiovascular: Normal rate and regular rhythm.   No murmur heard. Pulmonary/Chest: Effort normal and breath sounds normal. No respiratory distress. She has no wheezes.  Musculoskeletal: Normal range of motion. She exhibits no edema or tenderness.  Neurological: She is alert and oriented to person, place, and time. Coordination normal.  Skin: Skin is warm and dry. No rash noted. She is not diaphoretic.     Psychiatric: She has a normal mood and affect. Her behavior is normal.  Vitals reviewed.   Results for orders placed or performed in visit on 03/19/15  POCT INR  Result Value Ref Range   INR 2.5     Wound Dressing: Wound care performed using DuoDERM and topical antibiotic ointment and then rolled gauze. Patient instructed to change rolled gauze as it becomes saturated, but to leave the DuoDERM on for 3 days and change it then and then return here in 6 days second see how is doing. Patient also instructed to do wraps on both lower legs and she is sleeping.    Assessment & Plan:   Problem List Items Addressed This Visit    None    Visit Diagnoses    Cellulitis of right lower extremity    -  Primary    She has what appears to be cellulitis on her right lower extremity, open wound with purulent drainage on lateral lower leg. Significant LE swelling, leg wraps    Relevant Orders    Anaerobic and Aerobic Culture    PAF (paroxysmal atrial fibrillation)        Patient's INR is 2.5 today. She  takes 5 mg Coumadin daily and will continue that for now.        Follow up plan: Return in about 1 week (around 03/26/2015), or if symptoms worsen or fail to improve.  Caryl Pina, MD [facility] 03/19/2015, 1:19 PM

## 2015-03-19 NOTE — Telephone Encounter (Signed)
Patient prescribed Bactrim for leg infection today.  Since it can increase INR recommended that patient decrease warfarin to 1/2 tablet of the 5mg  warfarin while taking Bactrim. Recheck 03/21/15.  [name] notified Patient notified.

## 2015-03-19 NOTE — Patient Instructions (Signed)

## 2015-03-20 ENCOUNTER — Telehealth: Payer: Self-pay | Admitting: Family Medicine

## 2015-03-20 DIAGNOSIS — Z794 Long term (current) use of insulin: Secondary | ICD-10-CM | POA: Diagnosis not present

## 2015-03-20 DIAGNOSIS — S72112D Displaced fracture of greater trochanter of left femur, subsequent encounter for closed fracture with routine healing: Secondary | ICD-10-CM | POA: Diagnosis not present

## 2015-03-20 DIAGNOSIS — Z7901 Long term (current) use of anticoagulants: Secondary | ICD-10-CM | POA: Diagnosis not present

## 2015-03-20 DIAGNOSIS — Z9181 History of falling: Secondary | ICD-10-CM | POA: Diagnosis not present

## 2015-03-20 DIAGNOSIS — M62838 Other muscle spasm: Secondary | ICD-10-CM | POA: Diagnosis not present

## 2015-03-20 DIAGNOSIS — I509 Heart failure, unspecified: Secondary | ICD-10-CM | POA: Diagnosis not present

## 2015-03-20 DIAGNOSIS — E119 Type 2 diabetes mellitus without complications: Secondary | ICD-10-CM | POA: Diagnosis not present

## 2015-03-20 DIAGNOSIS — Z5181 Encounter for therapeutic drug level monitoring: Secondary | ICD-10-CM | POA: Diagnosis not present

## 2015-03-20 DIAGNOSIS — Z9981 Dependence on supplemental oxygen: Secondary | ICD-10-CM | POA: Diagnosis not present

## 2015-03-20 DIAGNOSIS — I4891 Unspecified atrial fibrillation: Secondary | ICD-10-CM | POA: Diagnosis not present

## 2015-03-20 NOTE — Telephone Encounter (Signed)
Called [facility]

## 2015-03-21 ENCOUNTER — Other Ambulatory Visit: Payer: Self-pay | Admitting: Cardiovascular Disease

## 2015-03-23 ENCOUNTER — Telehealth: Payer: Self-pay

## 2015-03-23 ENCOUNTER — Telehealth: Payer: Self-pay | Admitting: *Deleted

## 2015-03-23 ENCOUNTER — Ambulatory Visit (INDEPENDENT_AMBULATORY_CARE_PROVIDER_SITE_OTHER): Payer: Medicare Other | Admitting: Pharmacist

## 2015-03-23 ENCOUNTER — Telehealth: Payer: Self-pay | Admitting: Cardiovascular Disease

## 2015-03-23 DIAGNOSIS — Z794 Long term (current) use of insulin: Secondary | ICD-10-CM | POA: Diagnosis not present

## 2015-03-23 DIAGNOSIS — I509 Heart failure, unspecified: Secondary | ICD-10-CM | POA: Diagnosis not present

## 2015-03-23 DIAGNOSIS — Z9181 History of falling: Secondary | ICD-10-CM | POA: Diagnosis not present

## 2015-03-23 DIAGNOSIS — Z5181 Encounter for therapeutic drug level monitoring: Secondary | ICD-10-CM | POA: Diagnosis not present

## 2015-03-23 DIAGNOSIS — S72112D Displaced fracture of greater trochanter of left femur, subsequent encounter for closed fracture with routine healing: Secondary | ICD-10-CM | POA: Diagnosis not present

## 2015-03-23 DIAGNOSIS — I4891 Unspecified atrial fibrillation: Secondary | ICD-10-CM | POA: Diagnosis not present

## 2015-03-23 DIAGNOSIS — E119 Type 2 diabetes mellitus without complications: Secondary | ICD-10-CM | POA: Diagnosis not present

## 2015-03-23 DIAGNOSIS — Z7901 Long term (current) use of anticoagulants: Secondary | ICD-10-CM | POA: Diagnosis not present

## 2015-03-23 DIAGNOSIS — M62838 Other muscle spasm: Secondary | ICD-10-CM | POA: Diagnosis not present

## 2015-03-23 DIAGNOSIS — Z9981 Dependence on supplemental oxygen: Secondary | ICD-10-CM | POA: Diagnosis not present

## 2015-03-23 LAB — ANAEROBIC AND AEROBIC CULTURE

## 2015-03-23 LAB — POCT INR: INR: 2.5

## 2015-03-23 NOTE — Telephone Encounter (Signed)
Stew aware at [facility].

## 2015-03-23 NOTE — Progress Notes (Signed)
Patient informed and voices understanding 

## 2015-03-23 NOTE — Telephone Encounter (Signed)
I would continue to withhold the Procardia since her vital signs and blood pressure were good at the most recent visit and restart this if needed in the future or if she sees the cardiologist in the future if he sees it is necessary----please call the pharmacist with this information

## 2015-03-23 NOTE — Telephone Encounter (Signed)
Stew at [place] fills her pill box, her Procardia Xl was not on her med list when she returned from  Rehab, is there a reason? Should we continue

## 2015-03-23 NOTE — Telephone Encounter (Signed)
INR therpeutic.  Continue warfarin 5mg  1/2 tablet through 03/26/15 while taking Bactrim.  After finished switch back to warfarin 5mg  1 tablet daily.  Recheck INR 03/30/15. Maria with [facility] notified.  Patient's daughter in law  - Coralyn Mark notified.

## 2015-03-23 NOTE — Telephone Encounter (Signed)
Richardson Landry the Physical therapist from [place] said [name] wants to put her PT on hold until you treat her Cellulitis  He needs an order if this is OK

## 2015-03-23 NOTE — Telephone Encounter (Signed)
I believe that the physical therapy will help her swelling improved which will help the cellulitis improved quicker but if she insists that she wants the note and we can give the order to hold off until treated. Caryl Pina, MD [facility] 03/23/2015, 4:06 PM

## 2015-03-23 NOTE — Telephone Encounter (Signed)
Please call today,concerning her prescription.

## 2015-03-23 NOTE — Telephone Encounter (Signed)
Pharmacist calling to see if we showed Procardia on patients med list before he filled the Rx.

## 2015-03-23 NOTE — Telephone Encounter (Signed)
[name] with [facility] called with INR of 2.5. States patient is taking coumadin 2.5mg  daily. Please advise and contact [name] at [phone]

## 2015-03-24 NOTE — Telephone Encounter (Signed)
Spoke with patient, she will continue, [facility] aware

## 2015-03-25 ENCOUNTER — Encounter: Payer: Self-pay | Admitting: Family Medicine

## 2015-03-25 ENCOUNTER — Ambulatory Visit (INDEPENDENT_AMBULATORY_CARE_PROVIDER_SITE_OTHER): Payer: Medicare Other | Admitting: Family Medicine

## 2015-03-25 ENCOUNTER — Other Ambulatory Visit: Payer: Self-pay | Admitting: Family Medicine

## 2015-03-25 VITALS — BP 121/84 | HR 78 | Temp 97.1°F | Ht 61.0 in | Wt 140.6 lb

## 2015-03-25 DIAGNOSIS — S81801D Unspecified open wound, right lower leg, subsequent encounter: Secondary | ICD-10-CM | POA: Diagnosis not present

## 2015-03-25 DIAGNOSIS — S72112D Displaced fracture of greater trochanter of left femur, subsequent encounter for closed fracture with routine healing: Secondary | ICD-10-CM | POA: Diagnosis not present

## 2015-03-25 DIAGNOSIS — R609 Edema, unspecified: Secondary | ICD-10-CM

## 2015-03-25 DIAGNOSIS — S81802D Unspecified open wound, left lower leg, subsequent encounter: Secondary | ICD-10-CM

## 2015-03-25 DIAGNOSIS — Z794 Long term (current) use of insulin: Secondary | ICD-10-CM | POA: Diagnosis not present

## 2015-03-25 DIAGNOSIS — M62838 Other muscle spasm: Secondary | ICD-10-CM | POA: Diagnosis not present

## 2015-03-25 DIAGNOSIS — Z5181 Encounter for therapeutic drug level monitoring: Secondary | ICD-10-CM | POA: Diagnosis not present

## 2015-03-25 DIAGNOSIS — Z9181 History of falling: Secondary | ICD-10-CM | POA: Diagnosis not present

## 2015-03-25 DIAGNOSIS — E119 Type 2 diabetes mellitus without complications: Secondary | ICD-10-CM | POA: Diagnosis not present

## 2015-03-25 DIAGNOSIS — I4891 Unspecified atrial fibrillation: Secondary | ICD-10-CM | POA: Diagnosis not present

## 2015-03-25 DIAGNOSIS — Z9981 Dependence on supplemental oxygen: Secondary | ICD-10-CM | POA: Diagnosis not present

## 2015-03-25 DIAGNOSIS — I509 Heart failure, unspecified: Secondary | ICD-10-CM | POA: Diagnosis not present

## 2015-03-25 DIAGNOSIS — Z7901 Long term (current) use of anticoagulants: Secondary | ICD-10-CM | POA: Diagnosis not present

## 2015-03-25 NOTE — Progress Notes (Signed)
BP 121/84 mmHg  Pulse 78  Temp(Src) 97.1 F (36.2 C) (Oral)  Ht 5\' 1"  (1.549 m)  Wt 140 lb 9.6 oz (63.776 kg)  BMI 26.58 kg/m2   Subjective:    Patient ID: [name], female    DOB: November 28, 1927, 81 y.o.   MRN: [id number]  HPI: [name] is a 81 y.o. female presenting on 03/25/2015 for Followup legs   HPI Leg wound Patient has wound on her anterior right leg that we have been taking care of. And is here for recheck today. She has continued to do her Ace wraps to help with the leg swelling and then has been sticking an antibiotic and band aid over top of the wound. Last visit we put a Mepilex on it but they did not keep repeating that. The Ace wraps have significantly reduce the swelling in her legs.  Leg swelling Patient has bilateral lower extremity swelling that is chronic. It recently has caused the wounds in her legs where she is having a lot of seeping. This is also likely the cause of her skin breakdown.  Relevant past medical, surgical, family and social history reviewed and updated as indicated. Interim medical history since our last visit reviewed. Allergies and medications reviewed and updated.  Review of Systems  Constitutional: Negative for fever and chills.  HENT: Positive for nosebleeds. Negative for congestion, ear discharge and ear pain.   Eyes: Negative for redness and visual disturbance.  Respiratory: Negative for chest tightness and shortness of breath.   Cardiovascular: Negative for chest pain and leg swelling.  Genitourinary: Negative for dysuria and difficulty urinating.  Musculoskeletal: Negative for back pain and gait problem.  Skin: Positive for wound. Negative for color change and rash.  Neurological: Negative for light-headedness and headaches.  Psychiatric/Behavioral: Negative for behavioral problems and agitation.  All other systems reviewed and are negative.   Per HPI unless specifically indicated above     Medication List         This list is accurate as of: 03/25/15 10:48 AM.  Always use your most recent med list.               ALPRAZolam 0.25 MG tablet  Commonly known as:  XANAX  TAKE 1/2 TO 1 TABLET 2 TIMES A DAY AS NEEDED FOR ANXIETY     amiodarone 200 MG tablet  Commonly known as:  PACERONE  Take 0.5 tablets (100 mg total) by mouth daily. Please keep upcoming appointment     atorvastatin 20 MG tablet  Commonly known as:  LIPITOR  Take 1 tablet (20 mg total) by mouth every evening.     Azelastine HCl 0.15 % Soln  Place 1 spray into both nostrils 2 (two) times daily as needed (congestion).     baclofen 10 MG tablet  Commonly known as:  LIORESAL  Take 0.5 tablets (5 mg total) by mouth every 8 (eight) hours as needed for muscle spasms.     budesonide-formoterol 160-4.5 MCG/ACT inhaler  Commonly known as:  SYMBICORT  Inhale 1 puff into the lungs 2 (two) times daily.     docusate sodium 100 MG capsule  Commonly known as:  COLACE  Take 100 mg by mouth 2 (two) times daily.     feeding supplement (GLUCERNA SHAKE) Liqd  Take 237 mLs by mouth 2 (two) times daily between meals.     ferrous sulfate 325 (65 FE) MG tablet  Take 1 tablet (325 mg total) by mouth 3 (three) times  daily after meals.     fluticasone 50 MCG/ACT nasal spray  Commonly known as:  FLONASE  Place 1 spray into both nostrils 2 (two) times daily.     furosemide 40 MG tablet  Commonly known as:  LASIX  Take 1 tablet (40 mg total) by mouth daily. May take extra dose if weight increases 3.5 pounds in 24 hrs as needed for fluid and edema.     glucosamine-chondroitin 500-400 MG tablet  Take 1 tablet by mouth 2 (two) times daily.     glucose blood test strip  Commonly known as:  RELION ULTIMA TEST  USE TO CHECK GLUCOSE UP TO 4 TIMES DAILY     insulin aspart 100 UNIT/ML injection  Commonly known as:  NOVOLOG  Inject 0 to 8 units up to 3 times a day prior to meal per sliding scale     insulin detemir 100 UNIT/ML injection  Commonly  known as:  LEVEMIR  Inject 5 Units into the skin at bedtime.     Ipratropium-Albuterol 20-100 MCG/ACT Aers respimat  Commonly known as:  COMBIVENT  Inhale 1 puff into the lungs every 6 (six) hours as needed for wheezing or shortness of breath.     metoprolol succinate 25 MG 24 hr tablet  Commonly known as:  TOPROL-XL  TAKE 1/2 TABLET DAILY     NIFEDICAL XL 30 MG 24 hr tablet  Generic drug:  NIFEdipine  TAKE 1 TABLET DAILY     nitroGLYCERIN 0.4 MG SL tablet  Commonly known as:  NITROSTAT  Place 1 tablet (0.4 mg total) under the tongue every 5 (five) minutes as needed for chest pain.     pantoprazole 40 MG tablet  Commonly known as:  PROTONIX  TAKE 1 TABLET TWICE A DAY     potassium chloride SA 20 MEQ tablet  Commonly known as:  K-DUR,KLOR-CON  Take 1 tablet (20 mEq total) by mouth daily.     PRESERVISION/LUTEIN Caps  Take 1 capsule by mouth daily.     simethicone 80 MG chewable tablet  Commonly known as:  MYLICON  Chew 80 mg by mouth at bedtime as needed for flatulence.     sulfamethoxazole-trimethoprim 800-160 MG tablet  Commonly known as:  BACTRIM DS  Take 1 tablet by mouth 2 (two) times daily.     vitamin C 500 MG tablet  Commonly known as:  ASCORBIC ACID  Take 500 mg by mouth at bedtime.     VITAMIN D PO  Take 1 capsule by mouth daily.     warfarin 3 MG tablet  Commonly known as:  COUMADIN  Take 1 tablet (3 mg total) by mouth daily at 6 PM.           Objective:    BP 121/84 mmHg  Pulse 78  Temp(Src) 97.1 F (36.2 C) (Oral)  Ht 5\' 1"  (1.549 m)  Wt 140 lb 9.6 oz (63.776 kg)  BMI 26.58 kg/m2  Wt Readings from Last 3 Encounters:  03/25/15 140 lb 9.6 oz (63.776 kg)  03/19/15 138 lb (62.596 kg)  03/09/15 144 lb (65.318 kg)    Physical Exam  Constitutional: She is oriented to person, place, and time. She appears well-developed and well-nourished. No distress.  HENT:  Right Ear: External ear normal.  Left Ear: External ear normal.  Nose: Nose normal.   Mouth/Throat: Oropharynx is clear and moist.  Eyes: Conjunctivae and EOM are normal. Pupils are equal, round, and reactive to light.  Cardiovascular: Normal rate,  regular rhythm and normal heart sounds.   No murmur heard. Pulmonary/Chest: Effort normal and breath sounds normal. No respiratory distress. She has no wheezes.  Musculoskeletal: Normal range of motion. She exhibits no edema or tenderness.  Neurological: She is alert and oriented to person, place, and time. Coordination normal.  Skin: Skin is warm and dry. No rash noted. She is not diaphoretic.     Psychiatric: She has a normal mood and affect. Her behavior is normal.  Vitals reviewed.   Results for orders placed or performed in visit on 03/23/15  POCT INR  Result Value Ref Range   INR 2.5     Wound care: Cut and apply a small section of Mepilex/DuoDERM foam dressing to cover small opening on right anterior leg. Then apply Unna boot wraps with compression to help reduce the swelling. Mental list can stay on for 3 days and then be changed. Unna boot can also stay on for same time. Have home health care nurse aide do the same every 3 days.    Assessment & Plan:   Problem List Items Addressed This Visit    None    Visit Diagnoses    Swelling    -  Primary    We'll do Unna boot and continue to do Ace bandage wraps.    Wound, open, leg, right, subsequent encounter        Apply DuoDERM and Unna boot and continue to wrap to decrease swelling. Return in one to 2 weeks after vacation.        Follow up plan: Return in about 2 weeks (around 04/08/2015), or if symptoms worsen or fail to improve, for wound recheck.  Caryl Pina, MD [facility] 03/25/2015, 10:48 AM

## 2015-03-25 NOTE — Telephone Encounter (Signed)
May refill all 3. I do not have the dose of hydrocodone that she was on so we will need this dose before we can refill it but go ahead and do refills for all 3 and I will sign them. Thanks Caryl Pina, MD [facility] 03/25/2015, 4:42 PM

## 2015-03-26 DIAGNOSIS — Z5181 Encounter for therapeutic drug level monitoring: Secondary | ICD-10-CM | POA: Diagnosis not present

## 2015-03-26 DIAGNOSIS — E119 Type 2 diabetes mellitus without complications: Secondary | ICD-10-CM | POA: Diagnosis not present

## 2015-03-26 DIAGNOSIS — Z9981 Dependence on supplemental oxygen: Secondary | ICD-10-CM | POA: Diagnosis not present

## 2015-03-26 DIAGNOSIS — S72112D Displaced fracture of greater trochanter of left femur, subsequent encounter for closed fracture with routine healing: Secondary | ICD-10-CM | POA: Diagnosis not present

## 2015-03-26 DIAGNOSIS — Z7901 Long term (current) use of anticoagulants: Secondary | ICD-10-CM | POA: Diagnosis not present

## 2015-03-26 DIAGNOSIS — I4891 Unspecified atrial fibrillation: Secondary | ICD-10-CM | POA: Diagnosis not present

## 2015-03-26 DIAGNOSIS — Z9181 History of falling: Secondary | ICD-10-CM | POA: Diagnosis not present

## 2015-03-26 DIAGNOSIS — M62838 Other muscle spasm: Secondary | ICD-10-CM | POA: Diagnosis not present

## 2015-03-26 DIAGNOSIS — I509 Heart failure, unspecified: Secondary | ICD-10-CM | POA: Diagnosis not present

## 2015-03-26 DIAGNOSIS — Z794 Long term (current) use of insulin: Secondary | ICD-10-CM | POA: Diagnosis not present

## 2015-03-26 MED ORDER — HYDROCODONE-ACETAMINOPHEN 5-325 MG PO TABS: ORAL_TABLET | ORAL | Status: DC

## 2015-03-26 MED ORDER — ALPRAZOLAM 0.25 MG PO TABS: ORAL_TABLET | ORAL | Status: DC

## 2015-03-26 MED ORDER — BACLOFEN 10 MG PO TABS: 5.0000 mg | ORAL_TABLET | Freq: Three times a day (TID) | ORAL | Status: DC | PRN

## 2015-03-26 NOTE — Telephone Encounter (Signed)
Scripts for baclofen and xanax called to [facility].  Hydrocodone script ready. Patient will send her daughter to [facility] for this script.

## 2015-03-26 NOTE — Telephone Encounter (Signed)
Verbal orders givien to [place] at [facility]

## 2015-03-27 ENCOUNTER — Encounter (INDEPENDENT_AMBULATORY_CARE_PROVIDER_SITE_OTHER): Payer: Medicare Other | Admitting: Family Medicine

## 2015-03-27 DIAGNOSIS — Z5181 Encounter for therapeutic drug level monitoring: Secondary | ICD-10-CM | POA: Diagnosis not present

## 2015-03-27 DIAGNOSIS — M62838 Other muscle spasm: Secondary | ICD-10-CM

## 2015-03-27 DIAGNOSIS — Z794 Long term (current) use of insulin: Secondary | ICD-10-CM | POA: Diagnosis not present

## 2015-03-27 DIAGNOSIS — Z9181 History of falling: Secondary | ICD-10-CM | POA: Diagnosis not present

## 2015-03-27 DIAGNOSIS — S72112D Displaced fracture of greater trochanter of left femur, subsequent encounter for closed fracture with routine healing: Secondary | ICD-10-CM

## 2015-03-27 DIAGNOSIS — E119 Type 2 diabetes mellitus without complications: Secondary | ICD-10-CM | POA: Diagnosis not present

## 2015-03-27 DIAGNOSIS — I509 Heart failure, unspecified: Secondary | ICD-10-CM

## 2015-03-27 DIAGNOSIS — Z9981 Dependence on supplemental oxygen: Secondary | ICD-10-CM | POA: Diagnosis not present

## 2015-03-27 DIAGNOSIS — I4891 Unspecified atrial fibrillation: Secondary | ICD-10-CM

## 2015-03-27 DIAGNOSIS — Z7901 Long term (current) use of anticoagulants: Secondary | ICD-10-CM | POA: Diagnosis not present

## 2015-03-28 DIAGNOSIS — Z9181 History of falling: Secondary | ICD-10-CM | POA: Diagnosis not present

## 2015-03-28 DIAGNOSIS — Z9981 Dependence on supplemental oxygen: Secondary | ICD-10-CM | POA: Diagnosis not present

## 2015-03-28 DIAGNOSIS — Z5181 Encounter for therapeutic drug level monitoring: Secondary | ICD-10-CM | POA: Diagnosis not present

## 2015-03-28 DIAGNOSIS — S72112D Displaced fracture of greater trochanter of left femur, subsequent encounter for closed fracture with routine healing: Secondary | ICD-10-CM | POA: Diagnosis not present

## 2015-03-28 DIAGNOSIS — I509 Heart failure, unspecified: Secondary | ICD-10-CM | POA: Diagnosis not present

## 2015-03-28 DIAGNOSIS — Z7901 Long term (current) use of anticoagulants: Secondary | ICD-10-CM | POA: Diagnosis not present

## 2015-03-28 DIAGNOSIS — M62838 Other muscle spasm: Secondary | ICD-10-CM | POA: Diagnosis not present

## 2015-03-28 DIAGNOSIS — Z794 Long term (current) use of insulin: Secondary | ICD-10-CM | POA: Diagnosis not present

## 2015-03-28 DIAGNOSIS — E119 Type 2 diabetes mellitus without complications: Secondary | ICD-10-CM | POA: Diagnosis not present

## 2015-03-28 DIAGNOSIS — I4891 Unspecified atrial fibrillation: Secondary | ICD-10-CM | POA: Diagnosis not present

## 2015-03-30 ENCOUNTER — Telehealth: Payer: Self-pay | Admitting: *Deleted

## 2015-03-30 DIAGNOSIS — S72112D Displaced fracture of greater trochanter of left femur, subsequent encounter for closed fracture with routine healing: Secondary | ICD-10-CM | POA: Diagnosis not present

## 2015-03-30 DIAGNOSIS — Z794 Long term (current) use of insulin: Secondary | ICD-10-CM | POA: Diagnosis not present

## 2015-03-30 DIAGNOSIS — Z9181 History of falling: Secondary | ICD-10-CM | POA: Diagnosis not present

## 2015-03-30 DIAGNOSIS — I4891 Unspecified atrial fibrillation: Secondary | ICD-10-CM | POA: Diagnosis not present

## 2015-03-30 DIAGNOSIS — E119 Type 2 diabetes mellitus without complications: Secondary | ICD-10-CM | POA: Diagnosis not present

## 2015-03-30 DIAGNOSIS — Z7901 Long term (current) use of anticoagulants: Secondary | ICD-10-CM | POA: Diagnosis not present

## 2015-03-30 DIAGNOSIS — I509 Heart failure, unspecified: Secondary | ICD-10-CM | POA: Diagnosis not present

## 2015-03-30 DIAGNOSIS — Z5181 Encounter for therapeutic drug level monitoring: Secondary | ICD-10-CM | POA: Diagnosis not present

## 2015-03-30 DIAGNOSIS — Z9981 Dependence on supplemental oxygen: Secondary | ICD-10-CM | POA: Diagnosis not present

## 2015-03-30 DIAGNOSIS — M62838 Other muscle spasm: Secondary | ICD-10-CM | POA: Diagnosis not present

## 2015-03-30 NOTE — Telephone Encounter (Signed)
Received call from patient's daughter Melina Schools who is requesting an order for additional home health PT sessions.  She states her mother was told by the [place] that she was approaching discharge, and would need an order to continue [facility] PT.  Daughter states patient has been progressing, but has been slow due to cellulitis.  Will send to Dr. Warrick Parisian to advise.  Please send back to [facility], as patient's daughter requests a call back when this has been addressed. Thanks.

## 2015-03-30 NOTE — Telephone Encounter (Signed)
Okay to send an order based off of weakness. Caryl Pina, MD [facility] 03/30/2015, 5:01 PM

## 2015-03-31 ENCOUNTER — Telehealth: Payer: Self-pay | Admitting: Pharmacist Clinician (PhC)/ Clinical Pharmacy Specialist

## 2015-03-31 ENCOUNTER — Ambulatory Visit (INDEPENDENT_AMBULATORY_CARE_PROVIDER_SITE_OTHER): Payer: Medicare Other | Admitting: *Deleted

## 2015-03-31 ENCOUNTER — Telehealth: Payer: Self-pay

## 2015-03-31 DIAGNOSIS — Z7901 Long term (current) use of anticoagulants: Secondary | ICD-10-CM | POA: Diagnosis not present

## 2015-03-31 DIAGNOSIS — Z5181 Encounter for therapeutic drug level monitoring: Secondary | ICD-10-CM | POA: Diagnosis not present

## 2015-03-31 DIAGNOSIS — I480 Paroxysmal atrial fibrillation: Secondary | ICD-10-CM

## 2015-03-31 DIAGNOSIS — Z794 Long term (current) use of insulin: Secondary | ICD-10-CM | POA: Diagnosis not present

## 2015-03-31 DIAGNOSIS — K625 Hemorrhage of anus and rectum: Secondary | ICD-10-CM

## 2015-03-31 DIAGNOSIS — R109 Unspecified abdominal pain: Secondary | ICD-10-CM | POA: Diagnosis not present

## 2015-03-31 DIAGNOSIS — S72112D Displaced fracture of greater trochanter of left femur, subsequent encounter for closed fracture with routine healing: Secondary | ICD-10-CM | POA: Diagnosis not present

## 2015-03-31 DIAGNOSIS — E119 Type 2 diabetes mellitus without complications: Secondary | ICD-10-CM | POA: Diagnosis not present

## 2015-03-31 DIAGNOSIS — Z9181 History of falling: Secondary | ICD-10-CM | POA: Diagnosis not present

## 2015-03-31 DIAGNOSIS — I509 Heart failure, unspecified: Secondary | ICD-10-CM | POA: Diagnosis not present

## 2015-03-31 DIAGNOSIS — I4891 Unspecified atrial fibrillation: Secondary | ICD-10-CM | POA: Diagnosis not present

## 2015-03-31 DIAGNOSIS — Z9981 Dependence on supplemental oxygen: Secondary | ICD-10-CM | POA: Diagnosis not present

## 2015-03-31 DIAGNOSIS — M62838 Other muscle spasm: Secondary | ICD-10-CM | POA: Diagnosis not present

## 2015-03-31 LAB — POCT URINALYSIS DIPSTICK
Bilirubin, UA: NEGATIVE
Glucose, UA: NEGATIVE
Ketones, UA: NEGATIVE
NITRITE UA: NEGATIVE
RBC UA: NEGATIVE
Spec Grav, UA: 1.015
UROBILINOGEN UA: NEGATIVE
pH, UA: 5

## 2015-03-31 LAB — POCT UA - MICROSCOPIC ONLY
Casts, Ur, LPF, POC: NEGATIVE
Crystals, Ur, HPF, POC: NEGATIVE
Mucus, UA: NEGATIVE
Yeast, UA: NEGATIVE

## 2015-03-31 LAB — POCT INR: INR: 1.1

## 2015-03-31 MED ORDER — CIPROFLOXACIN HCL 500 MG PO TABS: 500.0000 mg | ORAL_TABLET | Freq: Two times a day (BID) | ORAL | Status: DC

## 2015-03-31 NOTE — Telephone Encounter (Signed)
Instructed patient in office to take 1 1/2 tablets of warfarin today and tomorrow.  Patient had been taking AB and her warfarin dose was cut in half until 9/29 when she then resumed taking 1 tablet a day.  It remains thick and will be re-checked tomorrow when she has an appointment with Dr. Laurance Flatten.

## 2015-03-31 NOTE — Telephone Encounter (Signed)
Marie at [facility] called to verify that patient had came in today to have PT/INR checked and instructions that were given.  I verified that patient did come in today, labwork was done, and patient is to take 1-1/2 tabs of Warfarin today and tomorrow and is coming back tomorrow for visit with DWM and levels will be rechecked at that time.

## 2015-04-01 ENCOUNTER — Ambulatory Visit: Payer: Medicare Other | Admitting: Family Medicine

## 2015-04-01 ENCOUNTER — Telehealth: Payer: Self-pay | Admitting: *Deleted

## 2015-04-01 ENCOUNTER — Ambulatory Visit: Payer: Medicare Other | Admitting: Cardiovascular Disease

## 2015-04-01 ENCOUNTER — Other Ambulatory Visit: Payer: Self-pay

## 2015-04-01 DIAGNOSIS — Z7901 Long term (current) use of anticoagulants: Secondary | ICD-10-CM | POA: Diagnosis not present

## 2015-04-01 DIAGNOSIS — E119 Type 2 diabetes mellitus without complications: Secondary | ICD-10-CM | POA: Diagnosis not present

## 2015-04-01 DIAGNOSIS — Z794 Long term (current) use of insulin: Secondary | ICD-10-CM | POA: Diagnosis not present

## 2015-04-01 DIAGNOSIS — Z5181 Encounter for therapeutic drug level monitoring: Secondary | ICD-10-CM | POA: Diagnosis not present

## 2015-04-01 DIAGNOSIS — Z9981 Dependence on supplemental oxygen: Secondary | ICD-10-CM | POA: Diagnosis not present

## 2015-04-01 DIAGNOSIS — I509 Heart failure, unspecified: Secondary | ICD-10-CM | POA: Diagnosis not present

## 2015-04-01 DIAGNOSIS — S72112D Displaced fracture of greater trochanter of left femur, subsequent encounter for closed fracture with routine healing: Secondary | ICD-10-CM | POA: Diagnosis not present

## 2015-04-01 DIAGNOSIS — M62838 Other muscle spasm: Secondary | ICD-10-CM | POA: Diagnosis not present

## 2015-04-01 DIAGNOSIS — Z9181 History of falling: Secondary | ICD-10-CM | POA: Diagnosis not present

## 2015-04-01 DIAGNOSIS — I4891 Unspecified atrial fibrillation: Secondary | ICD-10-CM | POA: Diagnosis not present

## 2015-04-01 LAB — CBC WITH DIFFERENTIAL/PLATELET
BASOS: 2 %
Basophils Absolute: 0.2 10*3/uL (ref 0.0–0.2)
EOS (ABSOLUTE): 0.2 10*3/uL (ref 0.0–0.4)
EOS: 3 %
HEMATOCRIT: 33.5 % — AB (ref 34.0–46.6)
Hemoglobin: 10.3 g/dL — ABNORMAL LOW (ref 11.1–15.9)
IMMATURE GRANULOCYTES: 0 %
Immature Grans (Abs): 0 10*3/uL (ref 0.0–0.1)
Lymphocytes Absolute: 1.4 10*3/uL (ref 0.7–3.1)
Lymphs: 22 %
MCH: 30.6 pg (ref 26.6–33.0)
MCHC: 30.7 g/dL — ABNORMAL LOW (ref 31.5–35.7)
MCV: 99 fL — AB (ref 79–97)
MONOS ABS: 0.6 10*3/uL (ref 0.1–0.9)
Monocytes: 9 %
NEUTROS PCT: 64 %
Neutrophils Absolute: 4 10*3/uL (ref 1.4–7.0)
Platelets: 315 10*3/uL (ref 150–379)
RBC: 3.37 x10E6/uL — ABNORMAL LOW (ref 3.77–5.28)
RDW: 14.7 % (ref 12.3–15.4)
WBC: 6.3 10*3/uL (ref 3.4–10.8)

## 2015-04-01 MED ORDER — FUROSEMIDE 40 MG PO TABS: 40.0000 mg | ORAL_TABLET | Freq: Every day | ORAL | Status: DC

## 2015-04-01 NOTE — Telephone Encounter (Signed)
Called Verdis Frederickson gave verbal orders from dr Laurance Flatten

## 2015-04-01 NOTE — Telephone Encounter (Signed)
This is okay to make this change

## 2015-04-01 NOTE — Telephone Encounter (Signed)
[name] from [facility] would like to change her dressing orders to leg. She would like to change to calcium alginate, gauze and ace wraps. We are currently doing duoderm and unna boot.

## 2015-04-02 ENCOUNTER — Telehealth: Payer: Self-pay | Admitting: *Deleted

## 2015-04-02 DIAGNOSIS — Z7901 Long term (current) use of anticoagulants: Secondary | ICD-10-CM | POA: Diagnosis not present

## 2015-04-02 DIAGNOSIS — Z5181 Encounter for therapeutic drug level monitoring: Secondary | ICD-10-CM | POA: Diagnosis not present

## 2015-04-02 DIAGNOSIS — Z9181 History of falling: Secondary | ICD-10-CM | POA: Diagnosis not present

## 2015-04-02 DIAGNOSIS — Z9981 Dependence on supplemental oxygen: Secondary | ICD-10-CM | POA: Diagnosis not present

## 2015-04-02 DIAGNOSIS — I509 Heart failure, unspecified: Secondary | ICD-10-CM | POA: Diagnosis not present

## 2015-04-02 DIAGNOSIS — E119 Type 2 diabetes mellitus without complications: Secondary | ICD-10-CM | POA: Diagnosis not present

## 2015-04-02 DIAGNOSIS — Z794 Long term (current) use of insulin: Secondary | ICD-10-CM | POA: Diagnosis not present

## 2015-04-02 DIAGNOSIS — I4891 Unspecified atrial fibrillation: Secondary | ICD-10-CM | POA: Diagnosis not present

## 2015-04-02 DIAGNOSIS — M62838 Other muscle spasm: Secondary | ICD-10-CM | POA: Diagnosis not present

## 2015-04-02 DIAGNOSIS — S72112D Displaced fracture of greater trochanter of left femur, subsequent encounter for closed fracture with routine healing: Secondary | ICD-10-CM | POA: Diagnosis not present

## 2015-04-02 NOTE — Telephone Encounter (Signed)
When do we need the next protime on her? Her last notes are confusing

## 2015-04-02 NOTE — Telephone Encounter (Signed)
Spoke to [employer]

## 2015-04-02 NOTE — Telephone Encounter (Signed)
Called [name] at [facility], gave new orders

## 2015-04-02 NOTE — Telephone Encounter (Signed)
Spoke with Verdis Frederickson - INR ordered for tomorrow 04/03/2015.  Patient was instructed by [name] to take 1 and 1/2 tablets for 2 days (06/30/2014 and 04/01/2015) I will call Mrs. Sek and have her take 1 tablet today - 04/02/2015.  Will adjust as needed based on tomorrow's INR results.

## 2015-04-02 NOTE — Telephone Encounter (Signed)
[facility] nurse is at pts home, increased edema in both ankles, 2+. Gained 4 lbs overnight per patient. She takes lasix 40 in am and 20 in pm.

## 2015-04-02 NOTE — Telephone Encounter (Signed)
Okay to increase to 40 twice a day until swelling improves. If continues despite that she will need to be seen again. Caryl Pina, MD [facility] 04/02/2015, 4:32 PM

## 2015-04-02 NOTE — Progress Notes (Signed)
Pt walked in today stating she saw one spot of blood on her nightgown this morning and nothing when she wiped and she hasn't had any other episodes of bleeding since. CBC, U/A, Microscopic and INR ordered per DWM and pt was given appt with DWM tomorrow.

## 2015-04-03 ENCOUNTER — Ambulatory Visit (INDEPENDENT_AMBULATORY_CARE_PROVIDER_SITE_OTHER): Payer: Medicare Other | Admitting: Pharmacist

## 2015-04-03 ENCOUNTER — Telehealth: Payer: Self-pay | Admitting: *Deleted

## 2015-04-03 DIAGNOSIS — I509 Heart failure, unspecified: Secondary | ICD-10-CM | POA: Diagnosis not present

## 2015-04-03 DIAGNOSIS — Z9181 History of falling: Secondary | ICD-10-CM | POA: Diagnosis not present

## 2015-04-03 DIAGNOSIS — Z794 Long term (current) use of insulin: Secondary | ICD-10-CM | POA: Diagnosis not present

## 2015-04-03 DIAGNOSIS — S72112D Displaced fracture of greater trochanter of left femur, subsequent encounter for closed fracture with routine healing: Secondary | ICD-10-CM | POA: Diagnosis not present

## 2015-04-03 DIAGNOSIS — I4891 Unspecified atrial fibrillation: Secondary | ICD-10-CM | POA: Diagnosis not present

## 2015-04-03 DIAGNOSIS — E119 Type 2 diabetes mellitus without complications: Secondary | ICD-10-CM | POA: Diagnosis not present

## 2015-04-03 DIAGNOSIS — Z7901 Long term (current) use of anticoagulants: Secondary | ICD-10-CM | POA: Diagnosis not present

## 2015-04-03 DIAGNOSIS — Z9981 Dependence on supplemental oxygen: Secondary | ICD-10-CM | POA: Diagnosis not present

## 2015-04-03 DIAGNOSIS — M62838 Other muscle spasm: Secondary | ICD-10-CM | POA: Diagnosis not present

## 2015-04-03 DIAGNOSIS — Z5181 Encounter for therapeutic drug level monitoring: Secondary | ICD-10-CM | POA: Diagnosis not present

## 2015-04-03 LAB — POCT INR: INR: 1.1

## 2015-04-03 NOTE — Progress Notes (Signed)
No charge for labs or visit - INR was checked in patient's home and reported to our office by home health nurse.

## 2015-04-03 NOTE — Telephone Encounter (Signed)
INR 1.1

## 2015-04-03 NOTE — Telephone Encounter (Signed)
subtherapeutic INR -  Patient states she is taking warfarin 5mg  1 tablet daily. Recommended - Take 2 tablets today and tomorrow, then restart 5mg  once daily Recheck in 4 days.

## 2015-04-06 ENCOUNTER — Emergency Department (HOSPITAL_COMMUNITY)
Admission: EM | Admit: 2015-04-06 | Discharge: 2015-04-06 | Disposition: A | Discharge status: Home / Self Care | Payer: Medicare Other | Attending: Emergency Medicine | Admitting: Emergency Medicine

## 2015-04-06 ENCOUNTER — Other Ambulatory Visit: Payer: Self-pay | Admitting: Nurse Practitioner

## 2015-04-06 ENCOUNTER — Other Ambulatory Visit: Payer: Self-pay | Admitting: Pharmacist

## 2015-04-06 ENCOUNTER — Telehealth: Payer: Self-pay | Admitting: *Deleted

## 2015-04-06 ENCOUNTER — Emergency Department (HOSPITAL_COMMUNITY): Payer: Medicare Other

## 2015-04-06 ENCOUNTER — Ambulatory Visit (INDEPENDENT_AMBULATORY_CARE_PROVIDER_SITE_OTHER): Payer: Medicare Other | Admitting: Pharmacist

## 2015-04-06 ENCOUNTER — Encounter (HOSPITAL_COMMUNITY): Payer: Self-pay | Admitting: Emergency Medicine

## 2015-04-06 DIAGNOSIS — Z9861 Coronary angioplasty status: Secondary | ICD-10-CM | POA: Diagnosis not present

## 2015-04-06 DIAGNOSIS — Z7951 Long term (current) use of inhaled steroids: Secondary | ICD-10-CM | POA: Diagnosis not present

## 2015-04-06 DIAGNOSIS — Z8701 Personal history of pneumonia (recurrent): Secondary | ICD-10-CM | POA: Insufficient documentation

## 2015-04-06 DIAGNOSIS — S72112D Displaced fracture of greater trochanter of left femur, subsequent encounter for closed fracture with routine healing: Secondary | ICD-10-CM | POA: Diagnosis not present

## 2015-04-06 DIAGNOSIS — Z794 Long term (current) use of insulin: Secondary | ICD-10-CM | POA: Diagnosis not present

## 2015-04-06 DIAGNOSIS — K219 Gastro-esophageal reflux disease without esophagitis: Secondary | ICD-10-CM | POA: Insufficient documentation

## 2015-04-06 DIAGNOSIS — Z87891 Personal history of nicotine dependence: Secondary | ICD-10-CM | POA: Insufficient documentation

## 2015-04-06 DIAGNOSIS — I10 Essential (primary) hypertension: Secondary | ICD-10-CM | POA: Diagnosis not present

## 2015-04-06 DIAGNOSIS — M158 Other polyosteoarthritis: Secondary | ICD-10-CM | POA: Insufficient documentation

## 2015-04-06 DIAGNOSIS — G8929 Other chronic pain: Secondary | ICD-10-CM | POA: Insufficient documentation

## 2015-04-06 DIAGNOSIS — F419 Anxiety disorder, unspecified: Secondary | ICD-10-CM | POA: Insufficient documentation

## 2015-04-06 DIAGNOSIS — E119 Type 2 diabetes mellitus without complications: Secondary | ICD-10-CM | POA: Diagnosis not present

## 2015-04-06 DIAGNOSIS — I509 Heart failure, unspecified: Secondary | ICD-10-CM

## 2015-04-06 DIAGNOSIS — Z79899 Other long term (current) drug therapy: Secondary | ICD-10-CM | POA: Diagnosis not present

## 2015-04-06 DIAGNOSIS — I340 Nonrheumatic mitral (valve) insufficiency: Secondary | ICD-10-CM | POA: Insufficient documentation

## 2015-04-06 DIAGNOSIS — Z9889 Other specified postprocedural states: Secondary | ICD-10-CM | POA: Insufficient documentation

## 2015-04-06 DIAGNOSIS — I252 Old myocardial infarction: Secondary | ICD-10-CM | POA: Diagnosis not present

## 2015-04-06 DIAGNOSIS — I4891 Unspecified atrial fibrillation: Secondary | ICD-10-CM | POA: Diagnosis not present

## 2015-04-06 DIAGNOSIS — I2510 Atherosclerotic heart disease of native coronary artery without angina pectoris: Secondary | ICD-10-CM | POA: Diagnosis not present

## 2015-04-06 DIAGNOSIS — R079 Chest pain, unspecified: Secondary | ICD-10-CM | POA: Diagnosis present

## 2015-04-06 DIAGNOSIS — Z8673 Personal history of transient ischemic attack (TIA), and cerebral infarction without residual deficits: Secondary | ICD-10-CM | POA: Diagnosis not present

## 2015-04-06 DIAGNOSIS — Z7901 Long term (current) use of anticoagulants: Secondary | ICD-10-CM | POA: Insufficient documentation

## 2015-04-06 DIAGNOSIS — M62838 Other muscle spasm: Secondary | ICD-10-CM | POA: Diagnosis not present

## 2015-04-06 DIAGNOSIS — R069 Unspecified abnormalities of breathing: Secondary | ICD-10-CM | POA: Diagnosis not present

## 2015-04-06 DIAGNOSIS — J449 Chronic obstructive pulmonary disease, unspecified: Secondary | ICD-10-CM | POA: Diagnosis not present

## 2015-04-06 DIAGNOSIS — E785 Hyperlipidemia, unspecified: Secondary | ICD-10-CM | POA: Insufficient documentation

## 2015-04-06 DIAGNOSIS — Z9181 History of falling: Secondary | ICD-10-CM | POA: Diagnosis not present

## 2015-04-06 DIAGNOSIS — Z5181 Encounter for therapeutic drug level monitoring: Secondary | ICD-10-CM | POA: Diagnosis not present

## 2015-04-06 DIAGNOSIS — R0602 Shortness of breath: Secondary | ICD-10-CM | POA: Diagnosis not present

## 2015-04-06 DIAGNOSIS — Z9981 Dependence on supplemental oxygen: Secondary | ICD-10-CM | POA: Diagnosis not present

## 2015-04-06 LAB — BASIC METABOLIC PANEL
Anion gap: 7 (ref 5–15)
BUN: 26 mg/dL — AB (ref 6–20)
CHLORIDE: 106 mmol/L (ref 101–111)
CO2: 28 mmol/L (ref 22–32)
Calcium: 9.2 mg/dL (ref 8.9–10.3)
Creatinine, Ser: 1.32 mg/dL — ABNORMAL HIGH (ref 0.44–1.00)
GFR calc Af Amer: 41 mL/min — ABNORMAL LOW (ref >60)
GFR calc non Af Amer: 36 mL/min — ABNORMAL LOW (ref >60)
GLUCOSE: 119 mg/dL — AB (ref 65–99)
POTASSIUM: 3.8 mmol/L (ref 3.5–5.1)
Sodium: 141 mmol/L (ref 135–145)

## 2015-04-06 LAB — CBC
HEMATOCRIT: 33.1 % — AB (ref 36.0–46.0)
Hemoglobin: 10 g/dL — ABNORMAL LOW (ref 12.0–15.0)
MCH: 31 pg (ref 26.0–34.0)
MCHC: 30.2 g/dL (ref 30.0–36.0)
MCV: 102.5 fL — AB (ref 78.0–100.0)
Platelets: 255 10*3/uL (ref 150–400)
RBC: 3.23 MIL/uL — ABNORMAL LOW (ref 3.87–5.11)
RDW: 14 % (ref 11.5–15.5)
WBC: 5.8 10*3/uL (ref 4.0–10.5)

## 2015-04-06 LAB — URINALYSIS, ROUTINE W REFLEX MICROSCOPIC
Bilirubin Urine: NEGATIVE
Glucose, UA: NEGATIVE mg/dL
Hgb urine dipstick: NEGATIVE
Ketones, ur: NEGATIVE mg/dL
LEUKOCYTES UA: NEGATIVE
NITRITE: NEGATIVE
PH: 6 (ref 5.0–8.0)
Protein, ur: 30 mg/dL — AB
SPECIFIC GRAVITY, URINE: 1.012 (ref 1.005–1.030)
Urobilinogen, UA: 0.2 mg/dL (ref 0.0–1.0)

## 2015-04-06 LAB — URINE MICROSCOPIC-ADD ON

## 2015-04-06 LAB — I-STAT TROPONIN, ED: Troponin i, poc: 0.06 ng/mL (ref 0.00–0.08)

## 2015-04-06 LAB — BRAIN NATRIURETIC PEPTIDE: B Natriuretic Peptide: 343.5 pg/mL — ABNORMAL HIGH (ref 0.0–100.0)

## 2015-04-06 LAB — POCT INR: INR: 2.1

## 2015-04-06 MED ORDER — FUROSEMIDE 10 MG/ML IJ SOLN
40.0000 mg | Freq: Once | INTRAMUSCULAR | Status: AC
  Administered 2015-04-06: 40 mg via INTRAVENOUS
  Filled 2015-04-06: qty 4

## 2015-04-06 MED ORDER — FUROSEMIDE 40 MG PO TABS: 40.0000 mg | ORAL_TABLET | Freq: Two times a day (BID) | ORAL | Status: DC

## 2015-04-06 NOTE — Progress Notes (Signed)
EDCM spoke to patient at bedside and family at bedside.  Patient's family reports patient lives at [place], "But is geared towards older people."  Patient has a chord she can pull for assistance if needed.  Patient has home health services with [facility] for RN and PT.  Patient reports she is able to perform her own ADLs without difficulty.  Patient cooks for herself.  Patient wears oxygen at home supplied by [facility].  Patient reports she has a Corporate investment banker, wheelchair and glucometer at home.  Patient was discharged from [facility] three weeks ago.  Patient's family reports they has spoken to [facility] and have reordered services for patient.  Patient reports her pcp is Dr. Laurance Flatten.  Patient and family deny and further dme or home health needs at this time.

## 2015-04-06 NOTE — ED Notes (Signed)
Patient was alert, oriented and stable upon discharge. RN went over AVS and patient had no further questions.  

## 2015-04-06 NOTE — ED Provider Notes (Signed)
CSN: [id number]     Arrival date & time 04/06/15  1546 History   First MD Initiated Contact with Patient 04/06/15 1637     Chief Complaint  Patient presents with  . Chest Pain     (Consider location/radiation/quality/duration/timing/severity/associated sxs/prior Treatment) HPI Patient has had increasing shortness of breath and lower extremity swelling despite increased dosing of Lasix. She has had Lasix 40 mg twice a day for the past 3 days. At baseline she does wear oxygen and is becoming very short of breath with mild exertion. She has had several episodes of chest pain. Last episode was approximately one night ago and was relieved by nitroglycerin. They have also been wrapping her legs and elevating without relief of swelling. There was a small wound that developed on the right lower extremity. That has been getting regular dressing changes and is improving. The patient was evaluated by home health nurse today and found to have increased edema and abnormal breath sounds, she was advised to come to the emergency department for further assessment. She has been on antibiotics approximately 8 days for a recently diagnosed UTI. She denies she's having a further symptoms of pain burning or urgency. The patient is currently doing rehabilitation for a hip fracture. She reports she has been doing well and getting up and doing her exercises regularly without significant difficulty aside from increasing shortness of breath. Incidentally, on the way to the emergency department the patient became much more short of breath and EMS was called. At that time she was found to have an empty oxygen tank. With supplemental oxygen the patient's shortness of breath and hypoxia improved significantly. Past Medical History  Diagnosis Date  . Hyperlipidemia   . Hypertension   . Anxiety   . GERD (gastroesophageal reflux disease)   . CAD (coronary artery disease)     Cath in 2006 showed an occluded RCA with left-to-right  collaterals & otherwise noncritical CAD with EF=25-30% at that time. EF subsequently improved to normal by 2D ECHO. Cath Aug. 2011 showed unchanged anatomy. > medical therapy recommended  . Diastolic dysfunction     Grade II  . RBBB (right bundle branch block)     Chronic  . Hypoxemic respiratory failure, chronic (HCC)     uses 2.5 liters of oxygen with sleep  . COPD (chronic obstructive pulmonary disease) (HCC)     GOLD 4 - PFT 06/08/10 FEV1 0.93 (62%), FEV1% 60, TLC 2.84 (69%0, DLCO 54%, +BD) On home O2.  . Secondary pulmonary hypertension ([place])   . Edema   . Angina   . Shortness of breath   . Bradycardia 08/24/2011  . Carotid artery disease (HCC)     Moderate, right greater than left which we following by duplex ultrasound. [place] 12/05/11 = RIght Bulb/Prox ICA: Moderate to severe amt of fibrous plaque elevating velocities w/in prox segment of ICA. Consistent w/a 50-69% diameter reduction. Left Bulb/Prox ICA: Moderate amt of fibrous plaque slightly elevating velocities w/in the prox segment of the ICA. Consistent w/a 0-49% diameter reduction.   Marland Kitchen PAF (paroxysmal atrial fibrillation) (HCC)     In the past, on coumadin  . History of stress test 07/02/09    Mild perfusion defect seen in the Basal Inferior region(s). This is consistent with an infarct/scar.  Post-stress EF=56%. Global LV systolic function is normal.  EKG is negative for ischemia.  No significant iscemia detected. Low risk scan.  . Lower extremity edema     ECHO 07/21/12 = EF 55-60%, performed  for TIA. Responding to diurectics. Continue diuresis w/ a goal dry weight of <160lb. Venous Duplex 07/27/11 = Right lower extremity: no evidence of thrombus or thrombophlebitis. Essentially normal right lower extremity venous duplex Doppler evaluation.  . Carotid bruit   . Mitral valve regurgitation     Mild to moderate by ECHO 07/21/12  . Sleep-related hypoventilation   . COPD with emphysema ([place])   . Coronary atherosclerosis of unspecified type  of vessel, native or graft   . PONV (postoperative nausea and vomiting)   . Family history of adverse reaction to anesthesia     "daughter gets PONV"  . CHF (congestive heart failure) ([place])     Hospitalized 08/22/11-08/26/11 with CHF secondary to diastolic dysfunction & hospitilized in 07/2012 with a similar problem. TTE 08/23/11 = normal EF.  Marland Kitchen Myocardial infarction [facility])     "she's had several" (01/13/2015)  . On home oxygen therapy     "?L; prn" (01/13/2015)  . Pneumonia ~ 2013  . Type II diabetes mellitus (HCC)     goal A1C is 8, to avoid hypoglycemia  . Sleep apnea   . Stroke [facility])     "/CT scan; ~ 2014; didn't even know she'd had it"  (01/13/2015)  . Arthritis     "all over"  . Chronic lower back pain   . Bleeds easily ([place])   . Fall during current hospitalization     "was at [facility]; fell; transferred to [facility]" (01/13/2015)   Past Surgical History  Procedure Laterality Date  . Shoulder open rotator cuff repair Right 07/2005  . Cardiac catheterization  2006 & 2011    Cath in 2006 showed an occluded RCA with left-to-right collaterals & otherwise noncritical CAD with EF=25-30% at that time. EF subsequently improved to normal by 2D ECHO. Cath Aug. 2011 showed unchanged anatomy. > medical therapy recommended.  . Coronary angioplasty    . Coronary angioplasty with stent placement      x2  . Carotid duplex  12/05/11    Moderate, right greater than left which we following by duplex ultrasound. [place] 12/05/11 = RIght Bulb/Prox ICA: Moderate to severe amt of fibrous plaque elevating velocities w/in prox segment of ICA. Consistent w/a 50-69% diameter reduction. Left Bulb/Prox ICA: Moderate amt of fibrous plaque slightly elevating velocities w/in the prox segment of the ICA. Consistent w/a 0-49% diameter reduction.   . Venous duplex  07/27/11    Venous Duplex 07/27/11 = Right lower extremity: no evidence of thrombus or thrombophlebitis. Essentially normal right lower extremity venous duplex Doppler  evaluation.  . Transvaginal tape (tvt) removal    . Cataract extraction w/ intraocular lens  implant, bilateral Bilateral   . Tonsillectomy    . Total abdominal hysterectomy  ~ 1967  . Femur im nail Left 01/15/2015    Procedure: INTRAMEDULLARY (IM) NAIL FEMORAL LEFT ;  Surgeon: Netta Cedars, MD;  Location: [place];  Service: Orthopedics;  Laterality: Left;   Family History  Problem Relation Age of Onset  . Cancer Mother     Skin  . Heart disease Mother   . Emphysema Father   . Heart disease Father   . Congestive Heart Failure Father   . Heart disease Brother   . Heart disease Brother   . Early death Sister    Social History  Substance Use Topics  . Smoking status: Former Smoker -- 1.50 packs/day for 15 years    Types: Cigarettes  . Smokeless tobacco: Never Used     Comment:  Smoked 1.5 packs per day for 15 years, quit in 1995.  Marland Kitchen Alcohol Use: 3.6 oz/week    6 Glasses of wine per week     Comment: 01/13/2015 "couple glasses of wine maybe 3 days/wk"   OB History    No data available     Review of Systems  10 Systems reviewed and are negative for acute change except as noted in the HPI.   Allergies  Atorvastatin; Codeine; Morphine; Other; Rosuvastatin; and Iohexol  Home Medications   Prior to Admission medications   Medication Sig Start Date End Date Taking? Authorizing Provider  ALPRAZolam (XANAX) 0.25 MG tablet TAKE 1/2 TO 1 TABLET 2 TIMES A DAY AS NEEDED FOR ANXIETY 03/26/15   Fransisca Kaufmann Dettinger, MD  amiodarone (PACERONE) 200 MG tablet Take 0.5 tablets (100 mg total) by mouth daily. Please keep upcoming appointment 12/30/14   Lorretta Harp, MD  atorvastatin (LIPITOR) 20 MG tablet Take 1 tablet (20 mg total) by mouth every evening. 03/11/15   Lorretta Harp, MD  Azelastine HCl 0.15 % SOLN Place 1 spray into both nostrils 2 (two) times daily as needed (congestion).    Historical Provider, MD  baclofen (LIORESAL) 10 MG tablet Take 0.5 tablets (5 mg total) by mouth every 8  (eight) hours as needed for muscle spasms. 03/26/15   Fransisca Kaufmann Dettinger, MD  budesonide-formoterol (SYMBICORT) 160-4.5 MCG/ACT inhaler Inhale 1 puff into the lungs 2 (two) times daily. 01/24/14   Chipper Herb, MD  Cholecalciferol (VITAMIN D PO) Take 1 capsule by mouth daily.     Historical Provider, MD  ciprofloxacin (CIPRO) 500 MG tablet Take 1 tablet (500 mg total) by mouth 2 (two) times daily. Patient not taking: Reported on 04/09/2015 03/31/15   Chipper Herb, MD  docusate sodium (COLACE) 100 MG capsule Take 100 mg by mouth 2 (two) times daily.    Historical Provider, MD  feeding supplement, GLUCERNA SHAKE, (GLUCERNA SHAKE) LIQD Take 237 mLs by mouth 2 (two) times daily between meals. 01/23/15   Eugenie Filler, MD  ferrous sulfate 325 (65 FE) MG tablet Take 1 tablet (325 mg total) by mouth 3 (three) times daily after meals. 01/23/15   Eugenie Filler, MD  fluticasone (FLONASE) 50 MCG/ACT nasal spray Place 1 spray into both nostrils 2 (two) times daily. 12/24/13   Chesley Mires, MD  furosemide (LASIX) 40 MG tablet Take 1 tablet (40 mg total) by mouth daily. May take extra dose if weight increases 3.5 pounds in 24 hrs as needed for fluid and edema. Patient taking differently: Take 40 mg by mouth daily. 80 mg every morning, 40 mg every evening. May take extra dose if weight increases 3.5 pounds in 24 hrs as needed for fluid and edema. 04/01/15   Fransisca Kaufmann Dettinger, MD  furosemide (LASIX) 40 MG tablet Take 1 tablet (40 mg total) by mouth 2 (two) times daily. 04/06/15   Charlesetta Shanks, MD  glucosamine-chondroitin 500-400 MG tablet Take 1 tablet by mouth 2 (two) times daily.    Historical Provider, MD  glucose blood (RELION ULTIMA TEST) test strip USE TO CHECK GLUCOSE UP TO 4 TIMES DAILY 07/21/14   Chipper Herb, MD  HUMALOG KWIKPEN 100 UNIT/ML KiwkPen INJECT UP TO 8 UNITS BEFORE MEALS AS DIRECTED BY SLIDING SCALE. MAX 24 UNITS PER DAY 04/07/15   Fransisca Kaufmann Dettinger, MD  HYDROcodone-acetaminophen  (NORCO/VICODIN) 5-325 MG tablet Take one tablet every six hours for moderate to severe pain prn 03/26/15  Fransisca Kaufmann Dettinger, MD  insulin aspart (NOVOLOG) 100 UNIT/ML injection Inject 0 to 8 units up to 3 times a day prior to meal per sliding scale 01/24/14   Chipper Herb, MD  insulin detemir (LEVEMIR) 100 UNIT/ML injection Inject 5 Units into the skin at bedtime.    Historical Provider, MD  Ipratropium-Albuterol (COMBIVENT) 20-100 MCG/ACT AERS respimat Inhale 1 puff into the lungs every 6 (six) hours as needed for wheezing or shortness of breath. 01/24/14   Chipper Herb, MD  LEVEMIR FLEXTOUCH 100 UNIT/ML Pen INJECT 20 UNITS SQ EVERY MORNING 04/07/15   Fransisca Kaufmann Dettinger, MD  metoprolol succinate (TOPROL-XL) 25 MG 24 hr tablet TAKE 1/2 TABLET DAILY 12/15/14   Chipper Herb, MD  Multiple Vitamins-Minerals (PRESERVISION/LUTEIN) CAPS Take 1 capsule by mouth daily.     Historical Provider, MD  NIFEDICAL XL 30 MG 24 hr tablet TAKE 1 TABLET DAILY 03/23/15   Lorretta Harp, MD  nitroGLYCERIN (NITROSTAT) 0.4 MG SL tablet Place 1 tablet (0.4 mg total) under the tongue every 5 (five) minutes as needed for chest pain. 05/09/14   Mary-Margaret Hassell Done, FNP  pantoprazole (PROTONIX) 40 MG tablet TAKE 1 TABLET TWICE A DAY 08/22/14   Mary-Margaret Hassell Done, FNP  potassium chloride SA (K-DUR,KLOR-CON) 20 MEQ tablet Take 1 tablet (20 mEq total) by mouth daily. 01/12/15   Lorretta Harp, MD  simethicone (MYLICON) 80 MG chewable tablet Chew 80 mg by mouth at bedtime as needed for flatulence.    Historical Provider, MD  vitamin C (ASCORBIC ACID) 500 MG tablet Take 500 mg by mouth at bedtime.     Historical Provider, MD  warfarin (COUMADIN) 3 MG tablet Take 1 tablet (3 mg total) by mouth daily at 6 PM. Patient taking differently: Take 5 mg by mouth daily at 6 PM. 1/2 tablet daily 03/09/15   Lauree Chandler, NP   BP 114/74 mmHg  Pulse 48  Temp(Src) 98.3 F (36.8 C) (Oral)  Resp 15  SpO2 100% Physical Exam   Constitutional: She is oriented to person, place, and time. She appears well-developed and well-nourished.  HENT:  Head: Normocephalic and atraumatic.  Eyes: EOM are normal. Pupils are equal, round, and reactive to light.  Neck: Neck supple.  Cardiovascular: Normal rate, regular rhythm and intact distal pulses.   2/6 systolic ejection murmur.  Pulmonary/Chest: Effort normal.  Fine basilar rales bilaterally.  Abdominal: Soft. Bowel sounds are normal. She exhibits no distension. There is no tenderness.  Musculoskeletal: Normal range of motion. She exhibits edema. She exhibits no tenderness.  Patient has approximately 1+ to 2+ pitting edema bilateral lower extremities. She has been wearing a ace compression bandage on the right lower extremity as well as a special dressing for a venous ulcer. The area of ulcer is actually significantly healed. This is approximate 1 cm clean, dry and scabbed. No localizing erythema around it to suggest associated cellulitis.  Neurological: She is alert and oriented to person, place, and time. She has normal strength. Coordination normal. GCS eye subscore is 4. GCS verbal subscore is 5. GCS motor subscore is 6.  Skin: Skin is warm, dry and intact.  Psychiatric: She has a normal mood and affect.    ED Course  Procedures (including critical care time) Labs Review Labs Reviewed  BASIC METABOLIC PANEL - Abnormal; Notable for the following:    Glucose, Bld 119 (*)    BUN 26 (*)    Creatinine, Ser 1.32 (*)    GFR calc  non Af Amer 36 (*)    GFR calc Af Amer 41 (*)    All other components within normal limits  CBC - Abnormal; Notable for the following:    RBC 3.23 (*)    Hemoglobin 10.0 (*)    HCT 33.1 (*)    MCV 102.5 (*)    All other components within normal limits  BRAIN NATRIURETIC PEPTIDE - Abnormal; Notable for the following:    B Natriuretic Peptide 343.5 (*)    All other components within normal limits  URINALYSIS, ROUTINE W REFLEX MICROSCOPIC (NOT  AT [facility]) - Abnormal; Notable for the following:    Protein, ur 30 (*)    All other components within normal limits  URINE MICROSCOPIC-ADD ON - Abnormal; Notable for the following:    Casts HYALINE CASTS (*)    All other components within normal limits  I-STAT TROPOININ, ED    Imaging Review No results found. I have personally reviewed and evaluated these images and lab results as part of my medical decision-making.   EKG Interpretation   Date/Time:  Monday April 06 2015 16:31:43 EDT Ventricular Rate:  56 PR Interval:  209 QRS Duration: 154 QT Interval:  473 QTC Calculation: 456 R Axis:   87 Text Interpretation:  Sinus rhythm Right bundle branch block agree. no  changr from old. Confirmed by Johnney Killian, MD, Jeannie Done [phone]) on 04/06/2015  4:38:28 PM      MDM   Final diagnoses:  Acute on chronic congestive heart failure, unspecified congestive heart failure type [facility])   Patient presents with complaint of increasing lower extremity edema despite taking her Lasix. She has also been expressing some increasing exertional dyspnea. The patient has known congestive heart failure. She had run out of oxygen on the way to the emergency department causing her to become very short of breath. Upon resumption of her oxygen the patient improved significantly. After evaluating diagnostic results and the patient's clinical condition, she appears safe to increase her Lasix temp rarely for mild CHF exacerbation. She is alert and appropriate without respiratory distress at rest. The patient is advised for close family physician follow-up to assess for response to treatment. She is discharged in stable condition with clear mental status and no acute respiratory distress.    Charlesetta Shanks, MD 04/09/15 [phone]

## 2015-04-06 NOTE — ED Notes (Signed)
Bed: [facility] Expected date:  Expected time:  Means of arrival:  Comments: EMS/37M/wound problem

## 2015-04-06 NOTE — ED Notes (Signed)
Helped pt to the bedside commode.

## 2015-04-06 NOTE — Telephone Encounter (Signed)
Terry notified - continue warfarin 5mg  take 1 tablet daily.

## 2015-04-06 NOTE — ED Notes (Signed)
Patient's daughter Melina Schools phone number: [phone]. Has medical power of attorney.

## 2015-04-06 NOTE — Discharge Instructions (Signed)
Heart Failure You'll take an extra dose of Lasix for the next 2 days. Take 80 mg in the morning (2 tablets) and your usual evening dose of 40 mg. See your doctor for recheck on Thursday. Return to the emergency department if your symptoms are worsening or new concerning symptoms are developing. Heart failure is a condition in which the heart has trouble pumping blood. This means your heart does not pump blood efficiently for your body to work well. In some cases of heart failure, fluid may back up into your lungs or you may have swelling (edema) in your lower legs. Heart failure is usually a long-term (chronic) condition. It is important for you to take good care of yourself and follow your health care provider's treatment plan. CAUSES  Some health conditions can cause heart failure. Those health conditions include:  High blood pressure (hypertension). Hypertension causes the heart muscle to work harder than normal. When pressure in the blood vessels is high, the heart needs to pump (contract) with more force in order to circulate blood throughout the body. High blood pressure eventually causes the heart to become stiff and weak.  Coronary artery disease (CAD). CAD is the buildup of cholesterol and fat (plaque) in the arteries of the heart. The blockage in the arteries deprives the heart muscle of oxygen and blood. This can cause chest pain and may lead to a heart attack. High blood pressure can also contribute to CAD.  Heart attack (myocardial infarction). A heart attack occurs when one or more arteries in the heart become blocked. The loss of oxygen damages the muscle tissue of the heart. When this happens, part of the heart muscle dies. The injured tissue does not contract as well and weakens the heart's ability to pump blood.  Abnormal heart valves. When the heart valves do not open and close properly, it can cause heart failure. This makes the heart muscle pump harder to keep the blood  flowing.  Heart muscle disease (cardiomyopathy or myocarditis). Heart muscle disease is damage to the heart muscle from a variety of causes. These can include drug or alcohol abuse, infections, or unknown reasons. These can increase the risk of heart failure.  Lung disease. Lung disease makes the heart work harder because the lungs do not work properly. This can cause a strain on the heart, leading it to fail.  Diabetes. Diabetes increases the risk of heart failure. High blood sugar contributes to high fat (lipid) levels in the blood. Diabetes can also cause slow damage to tiny blood vessels that carry important nutrients to the heart muscle. When the heart does not get enough oxygen and food, it can cause the heart to become weak and stiff. This leads to a heart that does not contract efficiently.  Other conditions can contribute to heart failure. These include abnormal heart rhythms, thyroid problems, and low blood counts (anemia). Certain unhealthy behaviors can increase the risk of heart failure, including:  Being overweight.  Smoking or chewing tobacco.  Eating foods high in fat and cholesterol.  Abusing illicit drugs or alcohol.  Lacking physical activity. SYMPTOMS  Heart failure symptoms may vary and can be hard to detect. Symptoms may include:  Shortness of breath with activity, such as climbing stairs.  Persistent cough.  Swelling of the feet, ankles, legs, or abdomen.  Unexplained weight gain.  Difficulty breathing when lying flat (orthopnea).  Waking from sleep because of the need to sit up and get more air.  Rapid heartbeat.  Fatigue  and loss of energy.  Feeling light-headed, dizzy, or close to fainting.  Loss of appetite.  Nausea.  Increased urination during the night (nocturia). DIAGNOSIS  A diagnosis of heart failure is based on your history, symptoms, physical examination, and diagnostic tests. Diagnostic tests for heart failure may  include:  Echocardiography.  Electrocardiography.  Chest X-ray.  Blood tests.  Exercise stress test.  Cardiac angiography.  Radionuclide scans. TREATMENT  Treatment is aimed at managing the symptoms of heart failure. Medicines, behavioral changes, or surgical intervention may be necessary to treat heart failure.  Medicines to help treat heart failure may include:  Angiotensin-converting enzyme (ACE) inhibitors. This type of medicine blocks the effects of a blood protein called angiotensin-converting enzyme. ACE inhibitors relax (dilate) the blood vessels and help lower blood pressure.  Angiotensin receptor blockers (ARBs). This type of medicine blocks the actions of a blood protein called angiotensin. Angiotensin receptor blockers dilate the blood vessels and help lower blood pressure.  Water pills (diuretics). Diuretics cause the kidneys to remove salt and water from the blood. The extra fluid is removed through urination. This loss of extra fluid lowers the volume of blood the heart pumps.  Beta blockers. These prevent the heart from beating too fast and improve heart muscle strength.  Digitalis. This increases the force of the heartbeat.  Healthy behavior changes include:  Obtaining and maintaining a healthy weight.  Stopping smoking or chewing tobacco.  Eating heart-healthy foods.  Limiting or avoiding alcohol.  Stopping illicit drug use.  Physical activity as directed by your health care provider.  Surgical treatment for heart failure may include:  A procedure to open blocked arteries, repair damaged heart valves, or remove damaged heart muscle tissue.  A pacemaker to improve heart muscle function and control certain abnormal heart rhythms.  An internal cardioverter defibrillator to treat certain serious abnormal heart rhythms.  A left ventricular assist device (LVAD) to assist the pumping ability of the heart. HOME CARE INSTRUCTIONS   Take medicines only  as directed by your health care provider. Medicines are important in reducing the workload of your heart, slowing the progression of heart failure, and improving your symptoms.  Do not stop taking your medicine unless directed by your health care provider.  Do not skip any dose of medicine.  Refill your prescriptions before you run out of medicine. Your medicines are needed every day.  Engage in moderate physical activity if directed by your health care provider. Moderate physical activity can benefit some people. The elderly and people with severe heart failure should consult with a health care provider for physical activity recommendations.  Eat heart-healthy foods. Food choices should be free of trans fat and low in saturated fat, cholesterol, and salt (sodium). Healthy choices include fresh or frozen fruits and vegetables, fish, lean meats, legumes, fat-free or low-fat dairy products, and whole grain or high fiber foods. Talk to a dietitian to learn more about heart-healthy foods.  Limit sodium if directed by your health care provider. Sodium restriction may reduce symptoms of heart failure in some people. Talk to a dietitian to learn more about heart-healthy seasonings.  Use healthy cooking methods. Healthy cooking methods include roasting, grilling, broiling, baking, poaching, steaming, or stir-frying. Talk to a dietitian to learn more about healthy cooking methods.  Limit fluids if directed by your health care provider. Fluid restriction may reduce symptoms of heart failure in some people.  Weigh yourself every day. Daily weights are important in the early recognition of excess fluid.  You should weigh yourself every morning after you urinate and before you eat breakfast. Wear the same amount of clothing each time you weigh yourself. Record your daily weight. Provide your health care provider with your weight record.  Monitor and record your blood pressure if directed by your health care  provider.  Check your pulse if directed by your health care provider.  Lose weight if directed by your health care provider. Weight loss may reduce symptoms of heart failure in some people.  Stop smoking or chewing tobacco. Nicotine makes your heart work harder by causing your blood vessels to constrict. Do not use nicotine gum or patches before talking to your health care provider.  Keep all follow-up visits as directed by your health care provider. This is important.  Limit alcohol intake to no more than 1 drink per day for nonpregnant women and 2 drinks per day for men. One drink equals 12 ounces of beer, 5 ounces of wine, or 1 ounces of hard liquor. Drinking more than that is harmful to your heart. Tell your health care provider if you drink alcohol several times a week. Talk with your health care provider about whether alcohol is safe for you. If your heart has already been damaged by alcohol or you have severe heart failure, drinking alcohol should be stopped completely.  Stop illicit drug use.  Stay up-to-date with immunizations. It is especially important to prevent respiratory infections through current pneumococcal and influenza immunizations.  Manage other health conditions such as hypertension, diabetes, thyroid disease, or abnormal heart rhythms as directed by your health care provider.  Learn to manage stress.  Plan rest periods when fatigued.  Learn strategies to manage high temperatures. If the weather is extremely hot:  Avoid vigorous physical activity.  Use air conditioning or fans or seek a cooler location.  Avoid caffeine and alcohol.  Wear loose-fitting, lightweight, and light-colored clothing.  Learn strategies to manage cold temperatures. If the weather is extremely cold:  Avoid vigorous physical activity.  Layer clothes.  Wear mittens or gloves, a hat, and a scarf when going outside.  Avoid alcohol.  Obtain ongoing education and support as  needed.  Participate in or seek rehabilitation as needed to maintain or improve independence and quality of life. SEEK MEDICAL CARE IF:   You have a rapid weight gain.  You have increasing shortness of breath that is unusual for you.  You are unable to participate in your usual physical activities.  You tire easily.  You cough more than normal, especially with physical activity.  You have any or more swelling in areas such as your hands, feet, ankles, or abdomen.  You are unable to sleep because it is hard to breathe.  You feel like your heart is beating fast (palpitations).  You become dizzy or light-headed upon standing up. SEEK IMMEDIATE MEDICAL CARE IF:   You have difficulty breathing.  There is a change in mental status such as decreased alertness or difficulty with concentration.  You have a pain or discomfort in your chest.  You have an episode of fainting (syncope). MAKE SURE YOU:   Understand these instructions.  Will watch your condition.  Will get help right away if you are not doing well or get worse.   This information is not intended to replace advice given to you by your health care provider. Make sure you discuss any questions you have with your health care provider.   Document Released: 06/13/2005 Document Revised: 10/28/2014 Document Reviewed:  07/13/2012 Elsevier Interactive Patient Education [employer].

## 2015-04-06 NOTE — ED Notes (Addendum)
Per EMS pt with current UTI called out for SOB, on arrival pt's O2 tank was empty and O2 sat on room air was 88% on room air, pt wears 2.5 L/min O2 all day, when EMS administered O2, hypoxia resolved. Pt got out of rehab a few weeks ago where she was for fractured hip. Family also complains of some increased edema, pt did not want to come to ED.  In contrast to above note, pt states that her home health nurse heard abnormal heart sounds, called MD, and was instructed to send pt to ED. Pericardial rub heard during S1. Present when pt holds breath.  Family adds that patient has had chest pain for a few days, pt now adds she has pain radiating up right arm.

## 2015-04-06 NOTE — ED Notes (Signed)
Patient refuses treatment including cardiac monitoring until family arrives. Attempted to place pt on cardiac monitor and obtain EKG, pt refused. When this RN tried to explain the importance of EKG and cardiac monitoring, pt became increasingly agitated until she would no longer allow RN to speak through pt's demands for family.

## 2015-04-06 NOTE — Telephone Encounter (Signed)
Maria from [facility] called and states that [name] BP is 180/60 P48 and she is feeling irritated today. She also states that her weight is down from 139 to 136. She still has 2+ edema in her lower extremities. She states that she has diminished sounds on left chest and is worried that she is going into CHF. I spoke with Dr. Warrick Parisian and he advised that patient be evaluated by the ER. I called and spoke with Coralyn Mark her daughter in law and she is going to get her and take her to the ER for evaluation.  INR today was 2.2  She took 10mg  Friday and Saturday and 5mg  on Sunday.

## 2015-04-07 ENCOUNTER — Ambulatory Visit: Payer: Medicare Other | Admitting: Family Medicine

## 2015-04-08 ENCOUNTER — Telehealth: Payer: Self-pay | Admitting: *Deleted

## 2015-04-08 ENCOUNTER — Ambulatory Visit: Payer: Medicare Other | Admitting: Family Medicine

## 2015-04-08 DIAGNOSIS — I509 Heart failure, unspecified: Secondary | ICD-10-CM | POA: Diagnosis not present

## 2015-04-08 DIAGNOSIS — M62838 Other muscle spasm: Secondary | ICD-10-CM | POA: Diagnosis not present

## 2015-04-08 NOTE — Telephone Encounter (Signed)
[name] to check INR tomorrow 04/09/2015 - notified [name] with order

## 2015-04-08 NOTE — Telephone Encounter (Signed)
Discharging patient on 04/09/15. Please call Verdis Frederickson with next protime, it wasn't in last notes.

## 2015-04-09 ENCOUNTER — Telehealth: Payer: Self-pay

## 2015-04-09 ENCOUNTER — Ambulatory Visit (INDEPENDENT_AMBULATORY_CARE_PROVIDER_SITE_OTHER): Payer: Medicare Other | Admitting: Family Medicine

## 2015-04-09 ENCOUNTER — Telehealth: Payer: Self-pay | Admitting: Pharmacist

## 2015-04-09 ENCOUNTER — Ambulatory Visit (INDEPENDENT_AMBULATORY_CARE_PROVIDER_SITE_OTHER): Payer: Medicare Other

## 2015-04-09 ENCOUNTER — Encounter: Payer: Self-pay | Admitting: Family Medicine

## 2015-04-09 VITALS — BP 111/58 | HR 52 | Temp 97.7°F | Ht 61.0 in | Wt 143.4 lb

## 2015-04-09 DIAGNOSIS — E119 Type 2 diabetes mellitus without complications: Secondary | ICD-10-CM | POA: Diagnosis not present

## 2015-04-09 DIAGNOSIS — Z9181 History of falling: Secondary | ICD-10-CM | POA: Diagnosis not present

## 2015-04-09 DIAGNOSIS — M62838 Other muscle spasm: Secondary | ICD-10-CM | POA: Diagnosis not present

## 2015-04-09 DIAGNOSIS — I4891 Unspecified atrial fibrillation: Secondary | ICD-10-CM | POA: Diagnosis not present

## 2015-04-09 DIAGNOSIS — Z9981 Dependence on supplemental oxygen: Secondary | ICD-10-CM | POA: Diagnosis not present

## 2015-04-09 DIAGNOSIS — M25552 Pain in left hip: Secondary | ICD-10-CM

## 2015-04-09 DIAGNOSIS — Z7901 Long term (current) use of anticoagulants: Secondary | ICD-10-CM | POA: Diagnosis not present

## 2015-04-09 DIAGNOSIS — Z23 Encounter for immunization: Secondary | ICD-10-CM | POA: Diagnosis not present

## 2015-04-09 DIAGNOSIS — Z794 Long term (current) use of insulin: Secondary | ICD-10-CM | POA: Diagnosis not present

## 2015-04-09 DIAGNOSIS — I509 Heart failure, unspecified: Secondary | ICD-10-CM | POA: Diagnosis not present

## 2015-04-09 DIAGNOSIS — S72112D Displaced fracture of greater trochanter of left femur, subsequent encounter for closed fracture with routine healing: Secondary | ICD-10-CM | POA: Diagnosis not present

## 2015-04-09 DIAGNOSIS — S81801D Unspecified open wound, right lower leg, subsequent encounter: Secondary | ICD-10-CM | POA: Diagnosis not present

## 2015-04-09 DIAGNOSIS — Z5181 Encounter for therapeutic drug level monitoring: Secondary | ICD-10-CM | POA: Diagnosis not present

## 2015-04-09 LAB — POCT INR: INR: 3.1

## 2015-04-09 NOTE — Progress Notes (Signed)
BP 111/58 mmHg  Pulse 52  Temp(Src) 97.7 F (36.5 C) (Oral)  Ht 5\' 1"  (1.549 m)  Wt 143 lb 6.4 oz (65.046 kg)  BMI 27.11 kg/m2   Subjective:    Patient ID: [name], female    DOB: 1927/10/28, 81 y.o.   MRN: [id number]  HPI: [name] is a 81 y.o. female presenting on 04/09/2015 for Followup right leg   HPI Wound recheck Wound on anterior right shin appears to be well healed over and just has a scab in the area. Her swelling looks much improved. She does have significantly dry skin in that area on both legs. No erythema or drainage noted  Left hip pain Patient also complains of left hip pain which is where she had her hip replaced just recently after her fracture. She has been working a lot with physical therapy and rehabilitation and pain has been stemming from those sessions. She would like an x-ray to make sure that nothing is shifted.  Relevant past medical, surgical, family and social history reviewed and updated as indicated. Interim medical history since our last visit reviewed. Allergies and medications reviewed and updated.  Review of Systems  Constitutional: Negative for fever and chills.  HENT: Negative for congestion, ear discharge and ear pain.   Eyes: Negative for redness and visual disturbance.  Respiratory: Negative for chest tightness and shortness of breath.   Cardiovascular: Negative for chest pain and leg swelling.  Genitourinary: Negative for dysuria and difficulty urinating.  Musculoskeletal: Positive for arthralgias. Negative for back pain and gait problem.  Skin: Positive for wound (well-healed). Negative for color change and rash.  Neurological: Negative for light-headedness and headaches.  Psychiatric/Behavioral: Negative for behavioral problems and agitation.  All other systems reviewed and are negative.   Per HPI unless specifically indicated above     Medication List       This list is accurate as of: 04/09/15  2:17 PM.  Always  use your most recent med list.               ALPRAZolam 0.25 MG tablet  Commonly known as:  XANAX  TAKE 1/2 TO 1 TABLET 2 TIMES A DAY AS NEEDED FOR ANXIETY     amiodarone 200 MG tablet  Commonly known as:  PACERONE  Take 0.5 tablets (100 mg total) by mouth daily. Please keep upcoming appointment     atorvastatin 20 MG tablet  Commonly known as:  LIPITOR  Take 1 tablet (20 mg total) by mouth every evening.     Azelastine HCl 0.15 % Soln  Place 1 spray into both nostrils 2 (two) times daily as needed (congestion).     baclofen 10 MG tablet  Commonly known as:  LIORESAL  Take 0.5 tablets (5 mg total) by mouth every 8 (eight) hours as needed for muscle spasms.     budesonide-formoterol 160-4.5 MCG/ACT inhaler  Commonly known as:  SYMBICORT  Inhale 1 puff into the lungs 2 (two) times daily.     ciprofloxacin 500 MG tablet  Commonly known as:  CIPRO  Take 1 tablet (500 mg total) by mouth 2 (two) times daily.     docusate sodium 100 MG capsule  Commonly known as:  COLACE  Take 100 mg by mouth 2 (two) times daily.     feeding supplement (GLUCERNA SHAKE) Liqd  Take 237 mLs by mouth 2 (two) times daily between meals.     ferrous sulfate 325 (65 FE) MG tablet  Take  1 tablet (325 mg total) by mouth 3 (three) times daily after meals.     fluticasone 50 MCG/ACT nasal spray  Commonly known as:  FLONASE  Place 1 spray into both nostrils 2 (two) times daily.     furosemide 40 MG tablet  Commonly known as:  LASIX  Take 1 tablet (40 mg total) by mouth daily. May take extra dose if weight increases 3.5 pounds in 24 hrs as needed for fluid and edema.     furosemide 40 MG tablet  Commonly known as:  LASIX  Take 1 tablet (40 mg total) by mouth 2 (two) times daily.     glucosamine-chondroitin 500-400 MG tablet  Take 1 tablet by mouth 2 (two) times daily.     glucose blood test strip  Commonly known as:  RELION ULTIMA TEST  USE TO CHECK GLUCOSE UP TO 4 TIMES DAILY     HUMALOG  KWIKPEN 100 UNIT/ML KiwkPen  Generic drug:  insulin lispro  INJECT UP TO 8 UNITS BEFORE MEALS AS DIRECTED BY SLIDING SCALE. MAX 24 UNITS PER DAY     HYDROcodone-acetaminophen 5-325 MG tablet  Commonly known as:  NORCO/VICODIN  Take one tablet every six hours for moderate to severe pain prn     insulin aspart 100 UNIT/ML injection  Commonly known as:  NOVOLOG  Inject 0 to 8 units up to 3 times a day prior to meal per sliding scale     insulin detemir 100 UNIT/ML injection  Commonly known as:  LEVEMIR  Inject 5 Units into the skin at bedtime.     LEVEMIR FLEXTOUCH 100 UNIT/ML Pen  Generic drug:  Insulin Detemir  INJECT 20 UNITS SQ EVERY MORNING     Ipratropium-Albuterol 20-100 MCG/ACT Aers respimat  Commonly known as:  COMBIVENT  Inhale 1 puff into the lungs every 6 (six) hours as needed for wheezing or shortness of breath.     metoprolol succinate 25 MG 24 hr tablet  Commonly known as:  TOPROL-XL  TAKE 1/2 TABLET DAILY     NIFEDICAL XL 30 MG 24 hr tablet  Generic drug:  NIFEdipine  TAKE 1 TABLET DAILY     nitroGLYCERIN 0.4 MG SL tablet  Commonly known as:  NITROSTAT  Place 1 tablet (0.4 mg total) under the tongue every 5 (five) minutes as needed for chest pain.     pantoprazole 40 MG tablet  Commonly known as:  PROTONIX  TAKE 1 TABLET TWICE A DAY     potassium chloride SA 20 MEQ tablet  Commonly known as:  K-DUR,KLOR-CON  Take 1 tablet (20 mEq total) by mouth daily.     PRESERVISION/LUTEIN Caps  Take 1 capsule by mouth daily.     simethicone 80 MG chewable tablet  Commonly known as:  MYLICON  Chew 80 mg by mouth at bedtime as needed for flatulence.     vitamin C 500 MG tablet  Commonly known as:  ASCORBIC ACID  Take 500 mg by mouth at bedtime.     VITAMIN D PO  Take 1 capsule by mouth daily.     warfarin 3 MG tablet  Commonly known as:  COUMADIN  Take 1 tablet (3 mg total) by mouth daily at 6 PM.           Objective:    BP 111/58 mmHg  Pulse 52   Temp(Src) 97.7 F (36.5 C) (Oral)  Ht 5\' 1"  (1.549 m)  Wt 143 lb 6.4 oz (65.046 kg)  BMI 27.11  kg/m2  Wt Readings from Last 3 Encounters:  04/09/15 143 lb 6.4 oz (65.046 kg)  03/25/15 140 lb 9.6 oz (63.776 kg)  03/19/15 138 lb (62.596 kg)    Physical Exam  Constitutional: She is oriented to person, place, and time. She appears well-developed and well-nourished. No distress.  Eyes: Conjunctivae and EOM are normal. Pupils are equal, round, and reactive to light.  Cardiovascular: Normal rate, regular rhythm, normal heart sounds and intact distal pulses.   No murmur heard. Pulmonary/Chest: Effort normal and breath sounds normal. No respiratory distress. She has no wheezes.  Musculoskeletal: Normal range of motion. She exhibits no edema.       Left hip: She exhibits tenderness (Pain in the anterior groin of left hip with internal and external rotation.). She exhibits normal range of motion, normal strength, no swelling, no crepitus and no deformity.       Legs: Neurological: She is alert and oriented to person, place, and time. Coordination normal.  Skin: Skin is warm and dry. No rash noted. She is not diaphoretic.  Psychiatric: She has a normal mood and affect. Her behavior is normal.  Vitals reviewed.   Results for orders placed or performed during the hospital encounter of 09/32/67  Basic metabolic panel  Result Value Ref Range   Sodium 141 135 - 145 mmol/L   Potassium 3.8 3.5 - 5.1 mmol/L   Chloride 106 101 - 111 mmol/L   CO2 28 22 - 32 mmol/L   Glucose, Bld 119 (H) 65 - 99 mg/dL   BUN 26 (H) 6 - 20 mg/dL   Creatinine, Ser 1.32 (H) 0.44 - 1.00 mg/dL   Calcium 9.2 8.9 - 10.3 mg/dL   GFR calc non Af Amer 36 (L) >60 mL/min   GFR calc Af Amer 41 (L) >60 mL/min   Anion gap 7 5 - 15  CBC  Result Value Ref Range   WBC 5.8 4.0 - 10.5 K/uL   RBC 3.23 (L) 3.87 - 5.11 MIL/uL   Hemoglobin 10.0 (L) 12.0 - 15.0 g/dL   HCT 33.1 (L) 36.0 - 46.0 %   MCV 102.5 (H) 78.0 - 100.0 fL   MCH  31.0 26.0 - 34.0 pg   MCHC 30.2 30.0 - 36.0 g/dL   RDW 14.0 11.5 - 15.5 %   Platelets 255 150 - 400 K/uL  Brain natriuretic peptide  Result Value Ref Range   B Natriuretic Peptide 343.5 (H) 0.0 - 100.0 pg/mL  Urinalysis, Routine w reflex microscopic (not at [facility])  Result Value Ref Range   Color, Urine YELLOW YELLOW   APPearance CLEAR CLEAR   Specific Gravity, Urine 1.012 1.005 - 1.030   pH 6.0 5.0 - 8.0   Glucose, UA NEGATIVE NEGATIVE mg/dL   Hgb urine dipstick NEGATIVE NEGATIVE   Bilirubin Urine NEGATIVE NEGATIVE   Ketones, ur NEGATIVE NEGATIVE mg/dL   Protein, ur 30 (A) NEGATIVE mg/dL   Urobilinogen, UA 0.2 0.0 - 1.0 mg/dL   Nitrite NEGATIVE NEGATIVE   Leukocytes, UA NEGATIVE NEGATIVE  Urine microscopic-add on  Result Value Ref Range   Squamous Epithelial / LPF RARE RARE   WBC, UA 0-2 <3 WBC/hpf   RBC / HPF 0-2 <3 RBC/hpf   Bacteria, UA RARE RARE   Casts HYALINE CASTS (A) NEGATIVE  I-stat troponin, ED  Result Value Ref Range   Troponin i, poc 0.06 0.00 - 0.08 ng/mL   Comment 3           Left hip x-ray:  Left hip x-ray on preliminary by Dr. Warrick Parisian. The x-ray shows hardware from when she had her repair after a hip fracture. I do not see any bony abnormalities. Await final read by cardiology    Assessment & Plan:   Problem List Items Addressed This Visit    None    Visit Diagnoses    Wound of right leg, subsequent encounter    -  Primary    well healed, just use lotion and b/l wraps at night to prevent swelling.     Left hip pain        Relevant Orders    DG HIP UNILAT W OR W/O PELVIS 2-3 VIEWS LEFT        Follow up plan: Return if symptoms worsen or fail to improve.  Caryl Pina, MD [facility] 04/09/2015, 2:17 PM

## 2015-04-09 NOTE — Telephone Encounter (Signed)
I have received fax.  INR was 3.1 today.  Patient is taking warfarin 5mg  1 tablet daily. This has increased from 2.1 in just 3 days.  Recommend decrease warfarin to 1/2 tablet = 2.5mg  on Thursdays and 1 tablet all other days.  RTC in 1 week to checked INR.

## 2015-04-09 NOTE — Patient Instructions (Signed)
well healed, just use lotion and b/l wraps at night to prevent swelling.

## 2015-04-09 NOTE — Telephone Encounter (Signed)
Daughter with patient today at appointment with Dr. Warrick Parisian.  She said that last year you had helped them get assistance through the county with Ms. Soule's insulin when she went into the donut hole with Medicare.  She would like for you to begin this process again for them for this year.

## 2015-04-09 NOTE — Telephone Encounter (Signed)
Patient is not actually in the Medicare coverage gap and must be in coverage gap before the patient assistance program can be used.  Spoke with [facility] and the issue is that patient is on such a low dose of insulin that 1 box of insulin pens is 60 to 90 days supply and her insurance changes a copay for each month or 30 days.  So a box of Levemir or Humalog pens #5 pens = 1500 units is a over a 90 days supply its cost is about $125.   I am going to talk with patient next week - maybe we can assist with samples and I can explain better.  She told me today that she has plenty of insulin right now.

## 2015-04-10 DIAGNOSIS — M62838 Other muscle spasm: Secondary | ICD-10-CM | POA: Diagnosis not present

## 2015-04-10 DIAGNOSIS — I509 Heart failure, unspecified: Secondary | ICD-10-CM | POA: Diagnosis not present

## 2015-04-10 DIAGNOSIS — Z9981 Dependence on supplemental oxygen: Secondary | ICD-10-CM | POA: Diagnosis not present

## 2015-04-10 DIAGNOSIS — E119 Type 2 diabetes mellitus without complications: Secondary | ICD-10-CM | POA: Diagnosis not present

## 2015-04-10 DIAGNOSIS — Z5181 Encounter for therapeutic drug level monitoring: Secondary | ICD-10-CM | POA: Diagnosis not present

## 2015-04-10 DIAGNOSIS — I4891 Unspecified atrial fibrillation: Secondary | ICD-10-CM | POA: Diagnosis not present

## 2015-04-10 DIAGNOSIS — Z794 Long term (current) use of insulin: Secondary | ICD-10-CM | POA: Diagnosis not present

## 2015-04-10 DIAGNOSIS — Z7901 Long term (current) use of anticoagulants: Secondary | ICD-10-CM | POA: Diagnosis not present

## 2015-04-10 DIAGNOSIS — S72112D Displaced fracture of greater trochanter of left femur, subsequent encounter for closed fracture with routine healing: Secondary | ICD-10-CM | POA: Diagnosis not present

## 2015-04-10 DIAGNOSIS — Z9181 History of falling: Secondary | ICD-10-CM | POA: Diagnosis not present

## 2015-04-15 ENCOUNTER — Other Ambulatory Visit: Payer: Self-pay | Admitting: Cardiovascular Disease

## 2015-04-15 ENCOUNTER — Other Ambulatory Visit: Payer: Self-pay | Admitting: *Deleted

## 2015-04-15 DIAGNOSIS — M62838 Other muscle spasm: Secondary | ICD-10-CM | POA: Diagnosis not present

## 2015-04-15 DIAGNOSIS — I509 Heart failure, unspecified: Secondary | ICD-10-CM | POA: Diagnosis not present

## 2015-04-15 DIAGNOSIS — S72112D Displaced fracture of greater trochanter of left femur, subsequent encounter for closed fracture with routine healing: Secondary | ICD-10-CM | POA: Diagnosis not present

## 2015-04-15 DIAGNOSIS — Z794 Long term (current) use of insulin: Secondary | ICD-10-CM | POA: Diagnosis not present

## 2015-04-15 DIAGNOSIS — E119 Type 2 diabetes mellitus without complications: Secondary | ICD-10-CM | POA: Diagnosis not present

## 2015-04-15 DIAGNOSIS — Z5181 Encounter for therapeutic drug level monitoring: Secondary | ICD-10-CM | POA: Diagnosis not present

## 2015-04-15 DIAGNOSIS — Z7901 Long term (current) use of anticoagulants: Secondary | ICD-10-CM | POA: Diagnosis not present

## 2015-04-15 DIAGNOSIS — Z9981 Dependence on supplemental oxygen: Secondary | ICD-10-CM | POA: Diagnosis not present

## 2015-04-15 DIAGNOSIS — Z9181 History of falling: Secondary | ICD-10-CM | POA: Diagnosis not present

## 2015-04-15 DIAGNOSIS — I4891 Unspecified atrial fibrillation: Secondary | ICD-10-CM | POA: Diagnosis not present

## 2015-04-15 MED ORDER — NIFEDIPINE ER OSMOTIC RELEASE 30 MG PO TB24: 30.0000 mg | ORAL_TABLET | Freq: Every day | ORAL | Status: DC

## 2015-04-16 ENCOUNTER — Telehealth: Payer: Self-pay | Admitting: Pharmacist

## 2015-04-16 ENCOUNTER — Ambulatory Visit (INDEPENDENT_AMBULATORY_CARE_PROVIDER_SITE_OTHER): Payer: Medicare Other

## 2015-04-16 ENCOUNTER — Encounter: Payer: Self-pay | Admitting: Family Medicine

## 2015-04-16 ENCOUNTER — Ambulatory Visit (INDEPENDENT_AMBULATORY_CARE_PROVIDER_SITE_OTHER): Payer: Medicare Other | Admitting: Family Medicine

## 2015-04-16 ENCOUNTER — Ambulatory Visit (INDEPENDENT_AMBULATORY_CARE_PROVIDER_SITE_OTHER): Payer: Medicare Other | Admitting: Pharmacist

## 2015-04-16 VITALS — BP 132/60 | HR 55 | Temp 97.3°F | Ht 61.0 in | Wt 131.0 lb

## 2015-04-16 DIAGNOSIS — I480 Paroxysmal atrial fibrillation: Secondary | ICD-10-CM | POA: Diagnosis not present

## 2015-04-16 DIAGNOSIS — B370 Candidal stomatitis: Secondary | ICD-10-CM | POA: Diagnosis not present

## 2015-04-16 DIAGNOSIS — S72142D Displaced intertrochanteric fracture of left femur, subsequent encounter for closed fracture with routine healing: Secondary | ICD-10-CM

## 2015-04-16 DIAGNOSIS — E119 Type 2 diabetes mellitus without complications: Secondary | ICD-10-CM

## 2015-04-16 DIAGNOSIS — Z794 Long term (current) use of insulin: Principal | ICD-10-CM

## 2015-04-16 LAB — POCT INR: INR: 3.4

## 2015-04-16 MED ORDER — FLUCONAZOLE 150 MG PO TABS: 150.0000 mg | ORAL_TABLET | Freq: Every day | ORAL | Status: DC

## 2015-04-16 NOTE — Telephone Encounter (Signed)
[name] wanted me to call her after [name]'s appt today with any changes.

## 2015-04-16 NOTE — Progress Notes (Signed)
BP 132/60 mmHg  Pulse 55  Temp(Src) 97.3 F (36.3 C) (Oral)  Ht 5\' 1"  (1.549 m)  Wt 131 lb (59.421 kg)  BMI 24.76 kg/m2   Subjective:    Patient ID: [name], female    DOB: 1928/01/20, 81 y.o.   MRN: [id number]  HPI: [name] is a 81 y.o. female presenting on 04/16/2015 for Sore Throat   HPI Sore throat  Patient presents today with sore throat that has been increasing since she stopped the antibiotic for her leg wound. She has not really been coughing but just been more sore, she denies any fevers or chills. She is concerned about congestion that she's been having her lungs but that's been ongoing.  Relevant past medical, surgical, family and social history reviewed and updated as indicated. Interim medical history since our last visit reviewed. Allergies and medications reviewed and updated.  Review of Systems  Constitutional: Negative for fever and chills.  HENT: Positive for sore throat and voice change. Negative for congestion, ear discharge, ear pain, mouth sores, postnasal drip, rhinorrhea, sinus pressure and sneezing.   Eyes: Negative for redness and visual disturbance.  Respiratory: Negative for chest tightness and shortness of breath.   Cardiovascular: Negative for chest pain and leg swelling.  Genitourinary: Negative for dysuria and difficulty urinating.  Musculoskeletal: Negative for back pain and gait problem.  Skin: Negative for rash.  Neurological: Negative for light-headedness and headaches.  Psychiatric/Behavioral: Negative for behavioral problems and agitation.  All other systems reviewed and are negative.   Per HPI unless specifically indicated above     Medication List       This list is accurate as of: 04/16/15 11:45 AM.  Always use your most recent med list.               ALPRAZolam 0.25 MG tablet  Commonly known as:  XANAX  TAKE 1/2 TO 1 TABLET 2 TIMES A DAY AS NEEDED FOR ANXIETY     amiodarone 200 MG tablet  Commonly known  as:  PACERONE  Take 0.5 tablets (100 mg total) by mouth daily. Please keep upcoming appointment     atorvastatin 20 MG tablet  Commonly known as:  LIPITOR  Take 1 tablet (20 mg total) by mouth every evening.     Azelastine HCl 0.15 % Soln  Place 1 spray into both nostrils 2 (two) times daily as needed (congestion).     baclofen 10 MG tablet  Commonly known as:  LIORESAL  Take 0.5 tablets (5 mg total) by mouth every 8 (eight) hours as needed for muscle spasms.     budesonide-formoterol 160-4.5 MCG/ACT inhaler  Commonly known as:  SYMBICORT  Inhale 1 puff into the lungs 2 (two) times daily.     ciprofloxacin 500 MG tablet  Commonly known as:  CIPRO  Take 1 tablet (500 mg total) by mouth 2 (two) times daily.     docusate sodium 100 MG capsule  Commonly known as:  COLACE  Take 100 mg by mouth 2 (two) times daily.     feeding supplement (GLUCERNA SHAKE) Liqd  Take 237 mLs by mouth 2 (two) times daily between meals.     ferrous sulfate 325 (65 FE) MG tablet  Take 1 tablet (325 mg total) by mouth 3 (three) times daily after meals.     fluconazole 150 MG tablet  Commonly known as:  DIFLUCAN  Take 1 tablet (150 mg total) by mouth daily.     fluticasone 50  MCG/ACT nasal spray  Commonly known as:  FLONASE  Place 1 spray into both nostrils 2 (two) times daily.     furosemide 40 MG tablet  Commonly known as:  LASIX  Take 1 tablet (40 mg total) by mouth daily. May take extra dose if weight increases 3.5 pounds in 24 hrs as needed for fluid and edema.     furosemide 40 MG tablet  Commonly known as:  LASIX  Take 1 tablet (40 mg total) by mouth 2 (two) times daily.     glucosamine-chondroitin 500-400 MG tablet  Take 1 tablet by mouth 2 (two) times daily.     glucose blood test strip  Commonly known as:  RELION ULTIMA TEST  USE TO CHECK GLUCOSE UP TO 4 TIMES DAILY     HUMALOG KWIKPEN 100 UNIT/ML KiwkPen  Generic drug:  insulin lispro  INJECT UP TO 8 UNITS BEFORE MEALS AS  DIRECTED BY SLIDING SCALE. MAX 24 UNITS PER DAY     HYDROcodone-acetaminophen 5-325 MG tablet  Commonly known as:  NORCO/VICODIN  Take one tablet every six hours for moderate to severe pain prn     insulin aspart 100 UNIT/ML injection  Commonly known as:  NOVOLOG  Inject 0 to 8 units up to 3 times a day prior to meal per sliding scale     insulin detemir 100 UNIT/ML injection  Commonly known as:  LEVEMIR  Inject 5 Units into the skin at bedtime.     LEVEMIR FLEXTOUCH 100 UNIT/ML Pen  Generic drug:  Insulin Detemir  INJECT 20 UNITS SQ EVERY MORNING     Ipratropium-Albuterol 20-100 MCG/ACT Aers respimat  Commonly known as:  COMBIVENT  Inhale 1 puff into the lungs every 6 (six) hours as needed for wheezing or shortness of breath.     metoprolol succinate 25 MG 24 hr tablet  Commonly known as:  TOPROL-XL  TAKE 1/2 TABLET DAILY     NIFEdipine 30 MG 24 hr tablet  Commonly known as:  NIFEDICAL XL  Take 1 tablet (30 mg total) by mouth daily.     nitroGLYCERIN 0.4 MG SL tablet  Commonly known as:  NITROSTAT  Place 1 tablet (0.4 mg total) under the tongue every 5 (five) minutes as needed for chest pain.     pantoprazole 40 MG tablet  Commonly known as:  PROTONIX  TAKE 1 TABLET TWICE A DAY     potassium chloride SA 20 MEQ tablet  Commonly known as:  K-DUR,KLOR-CON  Take 1 tablet (20 mEq total) by mouth daily.     PRESERVISION/LUTEIN Caps  Take 1 capsule by mouth daily.     simethicone 80 MG chewable tablet  Commonly known as:  MYLICON  Chew 80 mg by mouth at bedtime as needed for flatulence.     vitamin C 500 MG tablet  Commonly known as:  ASCORBIC ACID  Take 500 mg by mouth at bedtime.     VITAMIN D PO  Take 1 capsule by mouth daily.     warfarin 3 MG tablet  Commonly known as:  COUMADIN  Take 1 tablet (3 mg total) by mouth daily at 6 PM.           Objective:    BP 132/60 mmHg  Pulse 55  Temp(Src) 97.3 F (36.3 C) (Oral)  Ht 5\' 1"  (1.549 m)  Wt 131 lb  (59.421 kg)  BMI 24.76 kg/m2  Wt Readings from Last 3 Encounters:  04/16/15 131 lb (59.421 kg)  04/09/15 143 lb 6.4 oz (65.046 kg)  03/25/15 140 lb 9.6 oz (63.776 kg)    Physical Exam  Constitutional: She is oriented to person, place, and time. She appears well-developed and well-nourished. No distress.  HENT:  Right Ear: Tympanic membrane normal.  Left Ear: Tympanic membrane normal.  Nose: Nose normal. Right sinus exhibits no maxillary sinus tenderness and no frontal sinus tenderness. Left sinus exhibits no maxillary sinus tenderness and no frontal sinus tenderness.  Mouth/Throat: Uvula is midline and mucous membranes are normal. Oropharyngeal exudate (thin white film over the pnsistent with thrush) present.  Eyes: Conjunctivae and EOM are normal. Pupils are equal, round, and reactive to light.  Cardiovascular: Normal rate, regular rhythm, normal heart sounds and intact distal pulses.   No murmur heard. Pulmonary/Chest: Effort normal and breath sounds normal. No respiratory distress. She has no wheezes.  Musculoskeletal: Normal range of motion. She exhibits no edema or tenderness.  Neurological: She is alert and oriented to person, place, and time. Coordination normal.  Skin: Skin is warm and dry. No rash noted. She is not diaphoretic.  Psychiatric: She has a normal mood and affect. Her behavior is normal.  Vitals reviewed.   Results for orders placed or performed in visit on 04/09/15  POCT INR  Result Value Ref Range   INR 3.1       Assessment & Plan:   Problem List Items Addressed This Visit    None    Visit Diagnoses    Oral thrush    -  Primary    Likely from Symbicort inhaler, instructed to rinse after every use, send Diflucan. Tammy will adjust her Coumadin.    Relevant Medications    fluconazole (DIFLUCAN) 150 MG tablet        Follow up plan: Return if symptoms worsen or fail to improve.  Caryl Pina, MD [facility] 04/16/2015,  11:45 AM

## 2015-04-16 NOTE — Patient Instructions (Addendum)
Anticoagulation Dose Instructions as of 04/16/2015      Dorene Grebe Tue Wed Thu Fri Sat   New Dose 2.5 mg 2.5 mg 2.5 mg 2.5 mg 2.5 mg 2.5 mg 2.5 mg    Description        No warfarin today, then decrease warfarin to 1/2 tablet once daily while taking diflucan.

## 2015-04-16 NOTE — Progress Notes (Signed)
No charge - patient was triaged to Dr Dettinger for sore throat / possible thrush

## 2015-04-17 ENCOUNTER — Other Ambulatory Visit: Payer: Self-pay | Admitting: Pharmacist

## 2015-04-17 ENCOUNTER — Other Ambulatory Visit: Payer: Self-pay | Admitting: *Deleted

## 2015-04-17 DIAGNOSIS — Z5181 Encounter for therapeutic drug level monitoring: Secondary | ICD-10-CM | POA: Diagnosis not present

## 2015-04-17 DIAGNOSIS — I4891 Unspecified atrial fibrillation: Secondary | ICD-10-CM | POA: Diagnosis not present

## 2015-04-17 DIAGNOSIS — Z7901 Long term (current) use of anticoagulants: Secondary | ICD-10-CM | POA: Diagnosis not present

## 2015-04-17 DIAGNOSIS — Z9181 History of falling: Secondary | ICD-10-CM | POA: Diagnosis not present

## 2015-04-17 DIAGNOSIS — M62838 Other muscle spasm: Secondary | ICD-10-CM | POA: Diagnosis not present

## 2015-04-17 DIAGNOSIS — Z9981 Dependence on supplemental oxygen: Secondary | ICD-10-CM | POA: Diagnosis not present

## 2015-04-17 DIAGNOSIS — I509 Heart failure, unspecified: Secondary | ICD-10-CM | POA: Diagnosis not present

## 2015-04-17 DIAGNOSIS — Z794 Long term (current) use of insulin: Secondary | ICD-10-CM | POA: Diagnosis not present

## 2015-04-17 DIAGNOSIS — S72112D Displaced fracture of greater trochanter of left femur, subsequent encounter for closed fracture with routine healing: Secondary | ICD-10-CM | POA: Diagnosis not present

## 2015-04-17 DIAGNOSIS — E119 Type 2 diabetes mellitus without complications: Secondary | ICD-10-CM | POA: Diagnosis not present

## 2015-04-17 MED ORDER — FERROUS SULFATE 325 (65 FE) MG PO TABS: 325.0000 mg | ORAL_TABLET | Freq: Three times a day (TID) | ORAL | Status: DC

## 2015-04-17 MED ORDER — ATORVASTATIN CALCIUM 20 MG PO TABS: 20.0000 mg | ORAL_TABLET | Freq: Every evening | ORAL | Status: DC

## 2015-04-17 MED ORDER — METOPROLOL SUCCINATE ER 25 MG PO TB24: 12.5000 mg | ORAL_TABLET | Freq: Every day | ORAL | Status: DC

## 2015-04-20 ENCOUNTER — Other Ambulatory Visit: Payer: Self-pay | Admitting: Family Medicine

## 2015-04-21 ENCOUNTER — Telehealth: Payer: Self-pay | Admitting: Family Medicine

## 2015-04-21 DIAGNOSIS — S72112D Displaced fracture of greater trochanter of left femur, subsequent encounter for closed fracture with routine healing: Secondary | ICD-10-CM | POA: Diagnosis not present

## 2015-04-21 DIAGNOSIS — I509 Heart failure, unspecified: Secondary | ICD-10-CM | POA: Diagnosis not present

## 2015-04-21 DIAGNOSIS — Z7901 Long term (current) use of anticoagulants: Secondary | ICD-10-CM | POA: Diagnosis not present

## 2015-04-21 DIAGNOSIS — I4891 Unspecified atrial fibrillation: Secondary | ICD-10-CM | POA: Diagnosis not present

## 2015-04-21 DIAGNOSIS — Z5181 Encounter for therapeutic drug level monitoring: Secondary | ICD-10-CM | POA: Diagnosis not present

## 2015-04-21 DIAGNOSIS — Z794 Long term (current) use of insulin: Secondary | ICD-10-CM | POA: Diagnosis not present

## 2015-04-21 DIAGNOSIS — E119 Type 2 diabetes mellitus without complications: Secondary | ICD-10-CM | POA: Diagnosis not present

## 2015-04-21 DIAGNOSIS — M62838 Other muscle spasm: Secondary | ICD-10-CM | POA: Diagnosis not present

## 2015-04-21 DIAGNOSIS — Z9981 Dependence on supplemental oxygen: Secondary | ICD-10-CM | POA: Diagnosis not present

## 2015-04-21 DIAGNOSIS — Z9181 History of falling: Secondary | ICD-10-CM | POA: Diagnosis not present

## 2015-04-21 NOTE — Telephone Encounter (Signed)
Gave verbal order per Dr. Laurance Flatten

## 2015-04-22 ENCOUNTER — Encounter (INDEPENDENT_AMBULATORY_CARE_PROVIDER_SITE_OTHER): Payer: Self-pay

## 2015-04-22 ENCOUNTER — Encounter: Payer: Self-pay | Admitting: Family

## 2015-04-22 ENCOUNTER — Ambulatory Visit (INDEPENDENT_AMBULATORY_CARE_PROVIDER_SITE_OTHER): Payer: Medicare Other | Admitting: Pharmacist

## 2015-04-22 ENCOUNTER — Ambulatory Visit (INDEPENDENT_AMBULATORY_CARE_PROVIDER_SITE_OTHER): Payer: Medicare Other | Admitting: Family

## 2015-04-22 VITALS — BP 156/70 | HR 63 | Temp 97.1°F | Ht 61.0 in | Wt 134.6 lb

## 2015-04-22 DIAGNOSIS — S72142S Displaced intertrochanteric fracture of left femur, sequela: Secondary | ICD-10-CM

## 2015-04-22 DIAGNOSIS — I480 Paroxysmal atrial fibrillation: Secondary | ICD-10-CM

## 2015-04-22 DIAGNOSIS — S39011A Strain of muscle, fascia and tendon of abdomen, initial encounter: Secondary | ICD-10-CM | POA: Diagnosis not present

## 2015-04-22 DIAGNOSIS — R300 Dysuria: Secondary | ICD-10-CM

## 2015-04-22 DIAGNOSIS — R609 Edema, unspecified: Secondary | ICD-10-CM | POA: Diagnosis not present

## 2015-04-22 DIAGNOSIS — S72002D Fracture of unspecified part of neck of left femur, subsequent encounter for closed fracture with routine healing: Secondary | ICD-10-CM

## 2015-04-22 DIAGNOSIS — S76219A Strain of adductor muscle, fascia and tendon of unspecified thigh, initial encounter: Secondary | ICD-10-CM

## 2015-04-22 LAB — POCT UA - MICROSCOPIC ONLY
Bacteria, U Microscopic: NEGATIVE
CASTS, UR, LPF, POC: NEGATIVE
Crystals, Ur, HPF, POC: NEGATIVE
RBC, urine, microscopic: NEGATIVE
WBC, Ur, HPF, POC: NEGATIVE
YEAST UA: NEGATIVE

## 2015-04-22 LAB — POCT URINALYSIS DIPSTICK
Bilirubin, UA: NEGATIVE
Glucose, UA: NEGATIVE
Ketones, UA: NEGATIVE
LEUKOCYTES UA: NEGATIVE
NITRITE UA: NEGATIVE
PH UA: 5
RBC UA: NEGATIVE
Spec Grav, UA: 1.015
UROBILINOGEN UA: NEGATIVE

## 2015-04-22 LAB — POCT INR: INR: 5.5

## 2015-04-22 MED ORDER — TRAMADOL HCL 50 MG PO TABS: 50.0000 mg | ORAL_TABLET | Freq: Two times a day (BID) | ORAL | Status: DC | PRN

## 2015-04-22 NOTE — Progress Notes (Signed)
   Subjective:    Patient ID: [name], female    DOB: February 20, 1928, 80 y.o.   MRN: [id number]  Pt presents to the office today for left hip pain. Pt had a hip replacement 5 months ago. Pt  Is currently working with PT and states about two weeks ago she was stretching and is currently having left groin pain.  Hip Pain  The incident occurred more than 1 week ago. The pain is present in the left hip. The quality of the pain is described as aching. The pain is at a severity of 9/10. The pain is moderate. The pain has been intermittent since onset. The symptoms are aggravated by movement. She has tried acetaminophen and elevation for the symptoms. The treatment provided mild relief.      Review of Systems  Constitutional: Negative.   HENT: Negative.   Eyes: Negative.   Respiratory: Negative.  Negative for shortness of breath.   Cardiovascular: Negative.  Negative for palpitations.  Gastrointestinal: Negative.   Endocrine: Negative.   Genitourinary: Negative.   Musculoskeletal: Negative.   Neurological: Negative.  Negative for headaches.  Hematological: Negative.   Psychiatric/Behavioral: Negative.   All other systems reviewed and are negative.      Objective:   Physical Exam  Constitutional: She is oriented to person, place, and time. She appears well-developed and well-nourished. No distress.  HENT:  Head: Normocephalic and atraumatic.  Eyes: Pupils are equal, round, and reactive to light.  Neck: Normal range of motion. Neck supple. No thyromegaly present.  Cardiovascular: Normal rate, regular rhythm, normal heart sounds and intact distal pulses.   No murmur heard. Pulmonary/Chest: Effort normal and breath sounds normal. No respiratory distress. She has no wheezes.  Abdominal: Soft. Bowel sounds are normal. She exhibits no distension. There is no tenderness.  Musculoskeletal: Normal range of motion. She exhibits edema (3+ in BLE). She exhibits no tenderness.  Neurological: She  is alert and oriented to person, place, and time. She has normal reflexes. No cranial nerve deficit.  Skin: Skin is warm and dry.  Psychiatric: She has a normal mood and affect. Her behavior is normal. Judgment and thought content normal.  Vitals reviewed.     BP 156/70 mmHg  Pulse 63  Temp(Src) 97.1 F (36.2 C) (Oral)  Ht 5\' 1"  (1.549 m)  Wt 134 lb 9.6 oz (61.054 kg)  BMI 25.45 kg/m2     Assessment & Plan:  1. Intertrochanteric fracture of left hip, sequela - Ambulatory referral to Physical Therapy  2. Hip fracture, left, closed, with routine healing, subsequent encounter - Ambulatory referral to Physical Therapy  3. Groin strain, initial encounter -Rest -Ice -Continue with PT -Pt to stop Norco related to hallucinations- Ultram given today - Ambulatory referral to Physical Therapy - traMADol (ULTRAM) 50 MG tablet; Take 1 tablet (50 mg total) by mouth every 12 (twelve) hours as needed.  Dispense: 45 tablet; Refill: 2  4. Peripheral edema -Pt encouraged to weigh daily -Pt currently taking laxix 40 mg in AM and 20 mg in evening- Discussed if weight gain of 3lbs or more in a day or more than 5 lbs in a week to taking 40 mg in AM and 40 mg in evening -Keep feel elevated above heart -Keep chronic follow up with PCP and Cardiologists - traMADol (ULTRAM) 50 MG tablet; Take 1 tablet (50 mg total) by mouth every 12 (twelve) hours as needed.  Dispense: 45 tablet; Refill: [place], FNP

## 2015-04-22 NOTE — Patient Instructions (Signed)
Groin Strain  A groin strain (also called a groin pull) is an injury to the muscles or tendon on the upper inner part of the thigh. These muscles are called the adductor muscles or groin muscles. They are responsible for moving the leg across the body. A muscle strain occurs when a muscle is overstretched and some muscle fibers are torn. A groin strain can range from mild to severe depending on how many muscle fibers are affected and whether the muscle fibers are partially or completely torn.   Groin strains usually occur during exercise or participation in sports. The injury often happens when a sudden, violent force is placed on a muscle, stretching the muscle too far. A strain is more likely to occur when your muscles are not warmed up or if you are not properly conditioned. Depending on the severity of the groin strain, recovery time may vary from a few weeks to several weeks. Severe injuries often require 4-6 weeks for recovery. In these cases, complete healing can take 4-5 months.   CAUSES    Stretching the groin muscles too far or too suddenly, often during side-to-side motion with an abrupt change in direction.   Putting repeated stress on the groin muscles over a long period of time.   Performing vigorous activity without properly stretching the groin muscles beforehand.  SYMPTOMS    Pain and tenderness in the groin area. This begins as sharp pain and persists as a dull ache.   Popping or snapping feeling when the injury occurs (for severe strains).   Swelling or bruising.   Muscle spasms.   Weakness in the leg.   Stiffness in the groin area with decreased ability to move the affected muscles.  DIAGNOSIS   Your caregiver will perform a physical exam to diagnose a groin strain. You will be asked about your symptoms and how the injury occurred. X-rays are sometimes needed to rule out a broken bone or cartilage problems. Your caregiver may order a CT scan or MRI if a complete muscle tear is  suspected.  TREATMENT   A groin strain will often heal on its own. Your caregiver may prescribe medicines to help manage pain and swelling (anti-inflammatory medicine). You may be told to use crutches for the first few days to minimize your pain.  HOME CARE INSTRUCTIONS    Rest. Do not use the strained muscle if it causes pain.   Put ice on the injured area.   Put ice in a plastic bag.   Place a towel between your skin and the bag.   Leave the ice on for 15-20 minutes, every 2-3 hours. Do this for the first 2 days after the injury.   Only take over-the-counter or prescription medicines as directed by your caregiver.   Wrap the injured area with an elastic bandage as directed by your caregiver.   Keep the injured leg raised (elevated).   Walk, stretch, and perform range-of-motion exercises to improve blood flow to the injured area. Only perform these activities if you can do so without any pain.  To prevent muscle strains:   Warm up before exercise.   Develop proper conditioning and strength in the groin muscles.  SEEK IMMEDIATE MEDICAL CARE IF:    You have increased pain or swelling in the affected area.    Your symptoms are not improving or are getting worse.  MAKE SURE YOU:    Understand these instructions.   Will watch your condition.   Will get help   right away if you are not doing well or get worse.     This information is not intended to replace advice given to you by your health care provider. Make sure you discuss any questions you have with your health care provider.     Document Released: 02/09/2004 Document Revised: 05/30/2012 Document Reviewed: 02/15/2012  Elsevier Interactive Patient Education 2016 [employer]

## 2015-04-22 NOTE — Progress Notes (Signed)
Patient was triaged to Melinda Dun, NP for hip pain and UTI. No charge for my visit

## 2015-04-22 NOTE — Patient Instructions (Signed)
Anticoagulation Dose Instructions as of 04/22/2015      Dorene Grebe Tue Wed Thu Fri Sat   New Dose 2.5 mg 2.5 mg 2.5 mg 0 mg 0 mg 0 mg 2.5 mg   Alt Week 0 mg 2.5 mg 0 mg 2.5 mg 0 mg 2.5 mg 0 mg    Description        No warfarin for next 3 days.  Take 1/2 tablet alternating with holding until finished with fluconazole.      INR was 5.5 today   Decrease Levemir to 7 units daily.  If you continue to have blood glucose reading in the 80's or less then decrease to 6 units daily.

## 2015-04-23 ENCOUNTER — Ambulatory Visit: Payer: Self-pay | Admitting: Pharmacist

## 2015-04-23 ENCOUNTER — Other Ambulatory Visit: Payer: Self-pay | Admitting: Cardiovascular Disease

## 2015-04-23 DIAGNOSIS — I6523 Occlusion and stenosis of bilateral carotid arteries: Secondary | ICD-10-CM

## 2015-04-27 ENCOUNTER — Telehealth: Payer: Self-pay | Admitting: Nurse Practitioner

## 2015-04-27 DIAGNOSIS — M62838 Other muscle spasm: Secondary | ICD-10-CM | POA: Diagnosis not present

## 2015-04-27 DIAGNOSIS — I4891 Unspecified atrial fibrillation: Secondary | ICD-10-CM | POA: Diagnosis not present

## 2015-04-27 DIAGNOSIS — Z9981 Dependence on supplemental oxygen: Secondary | ICD-10-CM | POA: Diagnosis not present

## 2015-04-27 DIAGNOSIS — Z9181 History of falling: Secondary | ICD-10-CM | POA: Diagnosis not present

## 2015-04-27 DIAGNOSIS — S72112D Displaced fracture of greater trochanter of left femur, subsequent encounter for closed fracture with routine healing: Secondary | ICD-10-CM | POA: Diagnosis not present

## 2015-04-27 DIAGNOSIS — Z7901 Long term (current) use of anticoagulants: Secondary | ICD-10-CM | POA: Diagnosis not present

## 2015-04-27 DIAGNOSIS — E119 Type 2 diabetes mellitus without complications: Secondary | ICD-10-CM | POA: Diagnosis not present

## 2015-04-27 DIAGNOSIS — Z5181 Encounter for therapeutic drug level monitoring: Secondary | ICD-10-CM | POA: Diagnosis not present

## 2015-04-27 DIAGNOSIS — I509 Heart failure, unspecified: Secondary | ICD-10-CM | POA: Diagnosis not present

## 2015-04-27 DIAGNOSIS — Z794 Long term (current) use of insulin: Secondary | ICD-10-CM | POA: Diagnosis not present

## 2015-04-27 NOTE — Telephone Encounter (Signed)
Patient has a protime recheck for November 1,2016.

## 2015-04-28 ENCOUNTER — Ambulatory Visit (INDEPENDENT_AMBULATORY_CARE_PROVIDER_SITE_OTHER): Payer: Medicare Other | Admitting: Pharmacist Clinician (PhC)/ Clinical Pharmacy Specialist

## 2015-04-28 DIAGNOSIS — I480 Paroxysmal atrial fibrillation: Secondary | ICD-10-CM

## 2015-04-28 LAB — POCT INR: INR: 1.4

## 2015-04-28 NOTE — Patient Instructions (Signed)
Anticoagulation Dose Instructions as of 04/28/2015      [name] Tue Wed Thu Fri Sat   New Dose 2.5 mg 2.5 mg 5 mg 2.5 mg 5 mg 2.5 mg 5 mg    Description        Changed schedule today.  Follow for next 1 week

## 2015-04-29 ENCOUNTER — Encounter: Payer: Self-pay | Admitting: Cardiovascular Disease

## 2015-04-29 ENCOUNTER — Telehealth: Payer: Self-pay | Admitting: Cardiovascular Disease

## 2015-04-29 ENCOUNTER — Ambulatory Visit (HOSPITAL_COMMUNITY)
Admission: RE | Admit: 2015-04-29 | Discharge: 2015-04-29 | Disposition: A | Discharge status: Home / Self Care | Payer: Medicare Other | Source: Ambulatory Visit | Attending: Internal Medicine | Admitting: Internal Medicine

## 2015-04-29 ENCOUNTER — Ambulatory Visit (INDEPENDENT_AMBULATORY_CARE_PROVIDER_SITE_OTHER): Payer: Medicare Other | Admitting: Cardiovascular Disease

## 2015-04-29 VITALS — BP 126/64 | HR 57 | Ht 60.0 in | Wt 125.0 lb

## 2015-04-29 DIAGNOSIS — I2510 Atherosclerotic heart disease of native coronary artery without angina pectoris: Secondary | ICD-10-CM

## 2015-04-29 DIAGNOSIS — E785 Hyperlipidemia, unspecified: Secondary | ICD-10-CM | POA: Diagnosis not present

## 2015-04-29 DIAGNOSIS — I10 Essential (primary) hypertension: Secondary | ICD-10-CM

## 2015-04-29 DIAGNOSIS — I2583 Coronary atherosclerosis due to lipid rich plaque: Secondary | ICD-10-CM

## 2015-04-29 DIAGNOSIS — I6523 Occlusion and stenosis of bilateral carotid arteries: Secondary | ICD-10-CM

## 2015-04-29 DIAGNOSIS — I739 Peripheral vascular disease, unspecified: Secondary | ICD-10-CM

## 2015-04-29 DIAGNOSIS — I480 Paroxysmal atrial fibrillation: Secondary | ICD-10-CM | POA: Diagnosis not present

## 2015-04-29 DIAGNOSIS — E119 Type 2 diabetes mellitus without complications: Secondary | ICD-10-CM | POA: Diagnosis not present

## 2015-04-29 NOTE — Assessment & Plan Note (Signed)
History of CAD status post cath in 2006 revealing an occluded RCA with left right collaterals otherwise noncritical CAD. Her EF at that time was 25-30% which subsequently normalized by 2-D echo. Cath performed 01/25/10 showed unchanged anatomy.  She denies chest pain.

## 2015-04-29 NOTE — Patient Instructions (Signed)

## 2015-04-29 NOTE — Progress Notes (Signed)
04/29/2015 [name]   02/10/1928  [id number]  Primary Physician Redge Gainer, MD Primary Cardiologist: Lorretta Harp MD [name]  HPI:   The patient is an 81 year old, moderately overweight, widowed Caucasian female, mother of 2, grandmother to 4 grandchildren who I last saw in the office 5 months ago. She has a history of CAD status post cath in 2006 revealing an occluded RCA with left-to-right collaterals and otherwise noncritical CAD with EF of 25% to 30% at that time. EF subsequently improved to normal by 2D echo. Cath performed January 25, 2010, showed unchanged anatomy. Her other problems include hypertension, hyperlipidemia and insulin-dependent diabetes. She has COPD on home O2, quit smoking many years ago. She has moderate carotid disease right greater than left which we are following by duplex ultrasound. She is neurologically asymptomatic. She has had paroxysmal A-fib in the past on Coumadin anticoagulation. Dr. Halford Chessman follows her pulmonary status. She was hospitalized August 22, 2011 through August 26, 2011, with congestive heart failure secondary to diastolic dysfunction . Since I saw her back in June she has fallen and fractured her left hip on July 25 and had ORIF by Dr. Veverly Fells . She's recuperated slowly.she does have lower extremity edema which is being treated with increasing diuretic doses.    Current Outpatient Prescriptions  Medication Sig Dispense Refill  . ACCU-CHEK AVIVA PLUS test strip USE TO CHECK GLUCOSE UP TO 4 TIMES DAILY 200 each 2  . ALPRAZolam (XANAX) 0.25 MG tablet TAKE 1/2 TO 1 TABLET 2 TIMES A DAY AS NEEDED FOR ANXIETY 60 tablet 1  . amiodarone (PACERONE) 200 MG tablet Take 0.5 tablets (100 mg total) by mouth daily. Please keep upcoming appointment 30 tablet 0  . atorvastatin (LIPITOR) 20 MG tablet Take 1 tablet (20 mg total) by mouth every evening. 90 tablet 1  . Azelastine HCl 0.15 % SOLN Place 1 spray into both nostrils 2 (two) times  daily as needed (congestion).    . baclofen (LIORESAL) 10 MG tablet Take 0.5 tablets (5 mg total) by mouth every 8 (eight) hours as needed for muscle spasms. 45 each 0  . budesonide-formoterol (SYMBICORT) 160-4.5 MCG/ACT inhaler Inhale 1 puff into the lungs 2 (two) times daily. 3 Inhaler 2  . Cholecalciferol (VITAMIN D PO) Take 1 capsule by mouth daily.     Marland Kitchen docusate sodium (COLACE) 100 MG capsule Take 100 mg by mouth 2 (two) times daily.    . feeding supplement, GLUCERNA SHAKE, (GLUCERNA SHAKE) LIQD Take 237 mLs by mouth 2 (two) times daily between meals.  0  . ferrous sulfate 325 (65 FE) MG tablet Take 1 tablet (325 mg total) by mouth 3 (three) times daily after meals. 100 tablet 3  . fluconazole (DIFLUCAN) 150 MG tablet Take 1 tablet (150 mg total) by mouth daily. 14 tablet 0  . fluticasone (FLONASE) 50 MCG/ACT nasal spray Place 1 spray into both nostrils 2 (two) times daily. 16 g 6  . furosemide (LASIX) 40 MG tablet Take 1 tablet (40 mg total) by mouth daily. May take extra dose if weight increases 3.5 pounds in 24 hrs as needed for fluid and edema. (Patient taking differently: Take 80 mg by mouth daily. 40 mg every morning, 20 mg every evening, and 20 mg at night. May take extra dose if weight increases 3.5 pounds in 24 hrs as needed for fluid and edema.) 60 tablet 4  . glucosamine-chondroitin 500-400 MG tablet Take 1 tablet by mouth 2 (two) times daily.    Marland Kitchen  HUMALOG KWIKPEN 100 UNIT/ML KiwkPen INJECT UP TO 8 UNITS BEFORE MEALS AS DIRECTED BY SLIDING SCALE. MAX 24 UNITS PER DAY 15 mL 2  . Insulin Detemir (LEVEMIR FLEXTOUCH) 100 UNIT/ML Pen INJECT 7 UNITS SQ EVERY MORNING 15 mL 2  . Ipratropium-Albuterol (COMBIVENT) 20-100 MCG/ACT AERS respimat Inhale 1 puff into the lungs every 6 (six) hours as needed for wheezing or shortness of breath. 3 Inhaler 2  . metoprolol succinate (TOPROL-XL) 25 MG 24 hr tablet Take 0.5 tablets (12.5 mg total) by mouth daily. 45 tablet 1  . Multiple Vitamins-Minerals  (PRESERVISION/LUTEIN) CAPS Take 1 capsule by mouth daily.     Marland Kitchen NIFEdipine (NIFEDICAL XL) 30 MG 24 hr tablet Take 1 tablet (30 mg total) by mouth daily. 30 tablet 0  . nitroGLYCERIN (NITROSTAT) 0.4 MG SL tablet Place 1 tablet (0.4 mg total) under the tongue every 5 (five) minutes as needed for chest pain. 25 tablet 1  . pantoprazole (PROTONIX) 40 MG tablet Take 1 tablet (40 mg total) by mouth daily. 60 tablet 4  . potassium chloride SA (K-DUR,KLOR-CON) 20 MEQ tablet Take 1 tablet (20 mEq total) by mouth daily. 30 tablet 1  . simethicone (MYLICON) 80 MG chewable tablet Chew 80 mg by mouth at bedtime as needed for flatulence.    . traMADol (ULTRAM) 50 MG tablet Take 1 tablet (50 mg total) by mouth every 12 (twelve) hours as needed. (Patient taking differently: Take 25 mg by mouth at bedtime. ) 45 tablet 2  . vitamin C (ASCORBIC ACID) 500 MG tablet Take 500 mg by mouth at bedtime.     Marland Kitchen warfarin (COUMADIN) 3 MG tablet Take 1 tablet (3 mg total) by mouth daily at 6 PM. (Patient taking differently: Take 5 mg by mouth daily at 6 PM. 1/2 tablet daily)     No current facility-administered medications for this visit.    Allergies  Allergen Reactions  . Atorvastatin Other (See Comments)     muscle aches  . Codeine Nausea And Vomiting  . Morphine Nausea And Vomiting  . Other Other (See Comments)    OPIATES cause nausea and vomiting  . Rosuvastatin Other (See Comments)    muscle aches at high doses, held as of 12/2010 due to aches  . Iohexol Other (See Comments)     Desc: unknown reaction; allergic to iodine and contrast     Social History   Social History  . Marital Status: Widowed    Spouse Name: N/A  . Number of Children: N/A  . Years of Education: N/A   Occupational History  . Not on file.   Social History Main Topics  . Smoking status: Former Smoker -- 1.50 packs/day for 15 years    Types: Cigarettes  . Smokeless tobacco: Never Used     Comment: Smoked 1.5 packs per day for 15  years, quit in 1995.  Marland Kitchen Alcohol Use: 3.6 oz/week    6 Glasses of wine per week     Comment: 01/13/2015 "couple glasses of wine maybe 3 days/wk"  . Drug Use: No  . Sexual Activity: No   Other Topics Concern  . Not on file   Social History Narrative   Widowed, lives with daughter     Review of Systems: General: negative for chills, fever, night sweats or weight changes.  Cardiovascular: negative for chest pain, dyspnea on exertion, edema, orthopnea, palpitations, paroxysmal nocturnal dyspnea or shortness of breath Dermatological: negative for rash Respiratory: negative for cough or wheezing Urologic: negative  for hematuria Abdominal: negative for nausea, vomiting, diarrhea, bright red blood per rectum, melena, or hematemesis Neurologic: negative for visual changes, syncope, or dizziness All other systems reviewed and are otherwise negative except as noted above.    Blood pressure 126/64, pulse 57, height 5' (1.524 m), weight 125 lb (56.7 kg).  General appearance: alert and no distress Neck: no adenopathy, no JVD, supple, symmetrical, trachea midline, thyroid not enlarged, symmetric, no tenderness/mass/nodules and bilateral carotid bruits left louder than right Lungs: clear to auscultation bilaterally Heart: regular rate and rhythm, S1, S2 normal, no murmur, click, rub or gallop Extremities: 1+ edema bilaterally  EKG not performed today  ASSESSMENT AND PLAN:   PVD (peripheral vascular disease), moderate carotid and LSCA disease Miss Solecki  has moderate carotid disease right greater than left which we followed by duplex ultrasound. She was neurologically asymptomatic.  PAF, recurrance this admission, on chronic Amio/ Coumadin History of paroxysmal atrial fibrillation in the past on Coumadin anticoagulation  HYPERLIPIDEMIA, statin intol History of hyperlipidemia on atorvastatin.Most recent lab work performed 11/04/14 revealed an LDL 48 with an HDL of 100.  HTN  (hypertension) History of hypertension blood pressure measured at 126/64. She is on nifedipine and metoprolol.  CAD, remote RCA PCI, subsequently noted to be occluded, last cath 8/11 History of CAD status post cath in 2006 revealing an occluded RCA with left right collaterals otherwise noncritical CAD. Her EF at that time was 25-30% which subsequently normalized by 2-D echo. Cath performed 01/25/10 showed unchanged anatomy.  She denies chest pain.      Lorretta Harp MD FACP,FACC,FAHA, [facility] 04/29/2015 4:26 PM

## 2015-04-29 NOTE — Assessment & Plan Note (Signed)
History of paroxysmal atrial fibrillation in the past on Coumadin anticoagulation

## 2015-04-29 NOTE — Assessment & Plan Note (Signed)
History of hypertension blood pressure measured at 126/64. She is on nifedipine and metoprolol.

## 2015-04-29 NOTE — Assessment & Plan Note (Signed)
[name]  has moderate carotid disease right greater than left which we followed by duplex ultrasound. She was neurologically asymptomatic.

## 2015-04-29 NOTE — Telephone Encounter (Signed)
[name] called in stating that she faxed over some paper work regarding the pt's oxygen levels and if Dr. Gwenlyn Found would mind managing it. Please make sure that Dr. Gwenlyn Found discusses these options with the pt  Thanks

## 2015-04-29 NOTE — Assessment & Plan Note (Addendum)
History of hyperlipidemia on atorvastatin.Most recent lab work performed 11/04/14 revealed an LDL 48 with an HDL of 100.

## 2015-04-29 NOTE — Telephone Encounter (Signed)
Returned call to [name] spoke to [name] advised PCP needs to manage O2 levels.

## 2015-04-30 DIAGNOSIS — Z7901 Long term (current) use of anticoagulants: Secondary | ICD-10-CM | POA: Diagnosis not present

## 2015-04-30 DIAGNOSIS — Z9181 History of falling: Secondary | ICD-10-CM | POA: Diagnosis not present

## 2015-04-30 DIAGNOSIS — I509 Heart failure, unspecified: Secondary | ICD-10-CM | POA: Diagnosis not present

## 2015-04-30 DIAGNOSIS — Z9981 Dependence on supplemental oxygen: Secondary | ICD-10-CM | POA: Diagnosis not present

## 2015-04-30 DIAGNOSIS — M62838 Other muscle spasm: Secondary | ICD-10-CM | POA: Diagnosis not present

## 2015-04-30 DIAGNOSIS — Z5181 Encounter for therapeutic drug level monitoring: Secondary | ICD-10-CM | POA: Diagnosis not present

## 2015-04-30 DIAGNOSIS — S72112D Displaced fracture of greater trochanter of left femur, subsequent encounter for closed fracture with routine healing: Secondary | ICD-10-CM | POA: Diagnosis not present

## 2015-04-30 DIAGNOSIS — I4891 Unspecified atrial fibrillation: Secondary | ICD-10-CM | POA: Diagnosis not present

## 2015-04-30 DIAGNOSIS — E119 Type 2 diabetes mellitus without complications: Secondary | ICD-10-CM | POA: Diagnosis not present

## 2015-04-30 DIAGNOSIS — Z794 Long term (current) use of insulin: Secondary | ICD-10-CM | POA: Diagnosis not present

## 2015-05-04 DIAGNOSIS — M62838 Other muscle spasm: Secondary | ICD-10-CM | POA: Diagnosis not present

## 2015-05-04 DIAGNOSIS — I509 Heart failure, unspecified: Secondary | ICD-10-CM | POA: Diagnosis not present

## 2015-05-04 DIAGNOSIS — Z794 Long term (current) use of insulin: Secondary | ICD-10-CM | POA: Diagnosis not present

## 2015-05-04 DIAGNOSIS — Z7901 Long term (current) use of anticoagulants: Secondary | ICD-10-CM | POA: Diagnosis not present

## 2015-05-04 DIAGNOSIS — I4891 Unspecified atrial fibrillation: Secondary | ICD-10-CM | POA: Diagnosis not present

## 2015-05-04 DIAGNOSIS — Z5181 Encounter for therapeutic drug level monitoring: Secondary | ICD-10-CM | POA: Diagnosis not present

## 2015-05-04 DIAGNOSIS — Z9181 History of falling: Secondary | ICD-10-CM | POA: Diagnosis not present

## 2015-05-04 DIAGNOSIS — Z9981 Dependence on supplemental oxygen: Secondary | ICD-10-CM | POA: Diagnosis not present

## 2015-05-04 DIAGNOSIS — S72112D Displaced fracture of greater trochanter of left femur, subsequent encounter for closed fracture with routine healing: Secondary | ICD-10-CM | POA: Diagnosis not present

## 2015-05-04 DIAGNOSIS — E119 Type 2 diabetes mellitus without complications: Secondary | ICD-10-CM | POA: Diagnosis not present

## 2015-05-05 ENCOUNTER — Encounter: Payer: Self-pay | Admitting: Family Medicine

## 2015-05-05 ENCOUNTER — Ambulatory Visit (INDEPENDENT_AMBULATORY_CARE_PROVIDER_SITE_OTHER): Payer: Medicare Other | Admitting: Family Medicine

## 2015-05-05 ENCOUNTER — Ambulatory Visit (INDEPENDENT_AMBULATORY_CARE_PROVIDER_SITE_OTHER): Payer: Medicare Other

## 2015-05-05 VITALS — BP 107/60 | HR 60 | Temp 97.6°F | Ht 60.0 in | Wt 125.0 lb

## 2015-05-05 DIAGNOSIS — M25521 Pain in right elbow: Secondary | ICD-10-CM

## 2015-05-05 DIAGNOSIS — S5000XA Contusion of unspecified elbow, initial encounter: Secondary | ICD-10-CM

## 2015-05-05 NOTE — Progress Notes (Signed)
Subjective:    Patient ID: [name], female    DOB: 1927-12-05, 81 y.o.   MRN: [id number]  HPI Patient here today for right elbow pain that started about 3 days ago. She is still having some on-going groin and hip pain on the left side. The patient had recent x-rays of her left hip on October 13. Everything was stable according to the radiologist. These x-rays were again reviewed with the patient. The patient says that she got her right elbow stuck in a handicap bar recently.      Patient Active Problem List   Diagnosis Date Noted  . Intertrochanteric fracture of left hip ([place])   . Neck pain   . Postoperative anemia due to acute blood loss   . Hip fracture requiring operative repair ([place]) 01/15/2015  . Preop cardiovascular exam 01/14/2015  . Hip fracture ([place]) 01/13/2015  . Osteopenia 01/05/2015  . Primary osteoarthritis involving multiple joints 06/12/2014  . CHF (congestive heart failure) ([place]) 12/28/2013  . Atypical chest pain 12/18/2013  . Upper airway cough syndrome 05/20/2013  . Pain in joint, ankle and foot 12/06/2012  . Olecranon bursitis of left elbow 08/26/2012  . Scalp lesion 08/26/2012  . Counseling regarding advanced directives 08/26/2012  . DM type 2 causing CKD stage 3 ([place]) 07/30/2012  . Sacral fracture, closed ([place]) 07/22/2012  . Ataxia 07/20/2012  . Weakness of both legs 07/20/2012  . Falls frequently 07/20/2012  . RBBB 08/25/2011  . CAD, remote RCA PCI, subsequently noted to be occluded, last cath 8/11 08/25/2011  . PVD (peripheral vascular disease), moderate carotid and LSCA disease 08/25/2011  . HTN (hypertension) 08/22/2011  . AK (actinic keratosis) 07/17/2011  . Shoulder pain 04/03/2011  . Gait instability 10/11/2010  . PAF, recurrance this admission, on chronic Amio/ Coumadin 07/06/2010  . COPD with emphysema ([place]) 06/16/2010  . Chronic respiratory failure with hypoxia ([place]) 06/07/2010  . HYPERLIPIDEMIA, statin intol 02/11/2010   Outpatient  Encounter Prescriptions as of 05/05/2015  Medication Sig  . ACCU-CHEK AVIVA PLUS test strip USE TO CHECK GLUCOSE UP TO 4 TIMES DAILY  . ALPRAZolam (XANAX) 0.25 MG tablet TAKE 1/2 TO 1 TABLET 2 TIMES A DAY AS NEEDED FOR ANXIETY  . amiodarone (PACERONE) 200 MG tablet Take 0.5 tablets (100 mg total) by mouth daily. Please keep upcoming appointment  . atorvastatin (LIPITOR) 20 MG tablet Take 1 tablet (20 mg total) by mouth every evening.  . Azelastine HCl 0.15 % SOLN Place 1 spray into both nostrils 2 (two) times daily as needed (congestion).  . baclofen (LIORESAL) 10 MG tablet Take 0.5 tablets (5 mg total) by mouth every 8 (eight) hours as needed for muscle spasms.  . budesonide-formoterol (SYMBICORT) 160-4.5 MCG/ACT inhaler Inhale 1 puff into the lungs 2 (two) times daily.  . Cholecalciferol (VITAMIN D PO) Take 1 capsule by mouth daily.   Marland Kitchen docusate sodium (COLACE) 100 MG capsule Take 100 mg by mouth 2 (two) times daily.  . feeding supplement, GLUCERNA SHAKE, (GLUCERNA SHAKE) LIQD Take 237 mLs by mouth 2 (two) times daily between meals.  . ferrous sulfate 325 (65 FE) MG tablet Take 1 tablet (325 mg total) by mouth 3 (three) times daily after meals.  . fluconazole (DIFLUCAN) 150 MG tablet Take 1 tablet (150 mg total) by mouth daily.  . fluticasone (FLONASE) 50 MCG/ACT nasal spray Place 1 spray into both nostrils 2 (two) times daily.  . furosemide (LASIX) 40 MG tablet Take 1 tablet (40 mg total) by mouth  daily. May take extra dose if weight increases 3.5 pounds in 24 hrs as needed for fluid and edema. (Patient taking differently: Take 80 mg by mouth daily. 40 mg every morning, 20 mg every evening, and 20 mg at night. May take extra dose if weight increases 3.5 pounds in 24 hrs as needed for fluid and edema.)  . glucosamine-chondroitin 500-400 MG tablet Take 1 tablet by mouth 2 (two) times daily.  Marland Kitchen HUMALOG KWIKPEN 100 UNIT/ML KiwkPen INJECT UP TO 8 UNITS BEFORE MEALS AS DIRECTED BY SLIDING SCALE. MAX 24  UNITS PER DAY  . Insulin Detemir (LEVEMIR FLEXTOUCH) 100 UNIT/ML Pen INJECT 7 UNITS SQ EVERY MORNING  . Ipratropium-Albuterol (COMBIVENT) 20-100 MCG/ACT AERS respimat Inhale 1 puff into the lungs every 6 (six) hours as needed for wheezing or shortness of breath.  . metoprolol succinate (TOPROL-XL) 25 MG 24 hr tablet Take 0.5 tablets (12.5 mg total) by mouth daily.  . Multiple Vitamins-Minerals (PRESERVISION/LUTEIN) CAPS Take 1 capsule by mouth daily.   Marland Kitchen NIFEdipine (NIFEDICAL XL) 30 MG 24 hr tablet Take 1 tablet (30 mg total) by mouth daily.  . nitroGLYCERIN (NITROSTAT) 0.4 MG SL tablet Place 1 tablet (0.4 mg total) under the tongue every 5 (five) minutes as needed for chest pain.  . pantoprazole (PROTONIX) 40 MG tablet Take 1 tablet (40 mg total) by mouth daily.  . potassium chloride SA (K-DUR,KLOR-CON) 20 MEQ tablet Take 1 tablet (20 mEq total) by mouth daily.  . simethicone (MYLICON) 80 MG chewable tablet Chew 80 mg by mouth at bedtime as needed for flatulence.  . traMADol (ULTRAM) 50 MG tablet Take 1 tablet (50 mg total) by mouth every 12 (twelve) hours as needed. (Patient taking differently: Take 25 mg by mouth at bedtime. )  . vitamin C (ASCORBIC ACID) 500 MG tablet Take 500 mg by mouth at bedtime.   Marland Kitchen warfarin (COUMADIN) 3 MG tablet Take 1 tablet (3 mg total) by mouth daily at 6 PM. (Patient taking differently: Take 5 mg by mouth daily at 6 PM. 1/2 tablet daily)   No facility-administered encounter medications on file as of 05/05/2015.     Review of Systems  Constitutional: Negative.   HENT: Negative.   Eyes: Negative.   Respiratory: Negative.   Cardiovascular: Negative.   Gastrointestinal: Negative.   Endocrine: Negative.   Genitourinary: Negative.   Musculoskeletal: Positive for arthralgias (right elbow pain).  Skin: Negative.   Allergic/Immunologic: Negative.   Neurological: Negative.   Hematological: Negative.   Psychiatric/Behavioral: Negative.        Objective:    Physical Exam  Constitutional: She is oriented to person, place, and time. She appears well-developed and well-nourished. No distress.  The patient is alert and pleasant  HENT:  Head: Normocephalic and atraumatic.  Cardiovascular: Normal rate, regular rhythm and normal heart sounds.   No murmur heard. At 60/m  Pulmonary/Chest: Effort normal and breath sounds normal. She has no wheezes. She has no rales.  Musculoskeletal: She exhibits tenderness. She exhibits no edema.  There is bruising about the right elbow. There is slight warmth. There is good range of motion.  Neurological: She is alert and oriented to person, place, and time.  Skin: Skin is dry. No rash noted. No erythema. No pallor.  Psychiatric: She has a normal mood and affect. Her behavior is normal. Thought content normal.  Nursing note and vitals reviewed.  BP 107/60 mmHg  Pulse 60  Temp(Src) 97.6 F (36.4 C) (Oral)  Ht 5' (1.524 m)  Wt 125 lb (56.7 kg)  BMI 24.41 kg/m2  [facility] reading (PRIMARY) by  DMoore-right elbow no fracture observed                                        Assessment & Plan:  1. Right elbow pain - DG Elbow 2 Views Right; Future  2. Elbow contusion, unspecified laterality, initial encounter -Use warm wet compresses take Tylenol as needed for pain -This should get better with time.  Patient Instructions  The patient was reassured that there was no fracture in the right elbow area that was observed. An over read will be done by the radiologist. The pain most likely represents a bruise or contusion to the elbow and was sometime this should get better. She could use some warm wet compresses 20 minutes 3 or 4 times daily and this may help some. She should not use the right arm to push up with R to pull with for a few more days. She should use the left arm for any increase physical activity.    Arrie Senate MD

## 2015-05-05 NOTE — Patient Instructions (Addendum)
The patient was reassured that there was no fracture in the right elbow area that was observed. An over read will be done by the radiologist. The pain most likely represents a bruise or contusion to the elbow and was sometime this should get better. She could use some warm wet compresses 20 minutes 3 or 4 times daily and this may help some. She should not use the right arm to push up with R to pull with for a few more days. She should use the left arm for any increase physical activity.

## 2015-05-06 ENCOUNTER — Ambulatory Visit: Payer: Medicare Other | Admitting: Family Medicine

## 2015-05-06 ENCOUNTER — Other Ambulatory Visit: Payer: Self-pay | Admitting: Family Medicine

## 2015-05-07 ENCOUNTER — Ambulatory Visit: Payer: Medicare Other | Admitting: Family Medicine

## 2015-05-08 ENCOUNTER — Telehealth: Payer: Self-pay | Admitting: Family Medicine

## 2015-05-08 DIAGNOSIS — Z7901 Long term (current) use of anticoagulants: Secondary | ICD-10-CM | POA: Diagnosis not present

## 2015-05-08 DIAGNOSIS — I509 Heart failure, unspecified: Secondary | ICD-10-CM | POA: Diagnosis not present

## 2015-05-08 DIAGNOSIS — E119 Type 2 diabetes mellitus without complications: Secondary | ICD-10-CM | POA: Diagnosis not present

## 2015-05-08 DIAGNOSIS — M62838 Other muscle spasm: Secondary | ICD-10-CM | POA: Diagnosis not present

## 2015-05-08 DIAGNOSIS — Z9981 Dependence on supplemental oxygen: Secondary | ICD-10-CM | POA: Diagnosis not present

## 2015-05-08 DIAGNOSIS — Z5181 Encounter for therapeutic drug level monitoring: Secondary | ICD-10-CM | POA: Diagnosis not present

## 2015-05-08 DIAGNOSIS — Z9181 History of falling: Secondary | ICD-10-CM | POA: Diagnosis not present

## 2015-05-08 DIAGNOSIS — Z794 Long term (current) use of insulin: Secondary | ICD-10-CM | POA: Diagnosis not present

## 2015-05-08 DIAGNOSIS — S72112D Displaced fracture of greater trochanter of left femur, subsequent encounter for closed fracture with routine healing: Secondary | ICD-10-CM | POA: Diagnosis not present

## 2015-05-08 DIAGNOSIS — I4891 Unspecified atrial fibrillation: Secondary | ICD-10-CM | POA: Diagnosis not present

## 2015-05-08 NOTE — Telephone Encounter (Signed)
Call given to home health nurse

## 2015-05-09 DIAGNOSIS — I509 Heart failure, unspecified: Secondary | ICD-10-CM | POA: Diagnosis not present

## 2015-05-09 DIAGNOSIS — M62838 Other muscle spasm: Secondary | ICD-10-CM | POA: Diagnosis not present

## 2015-05-12 DIAGNOSIS — Z9181 History of falling: Secondary | ICD-10-CM | POA: Diagnosis not present

## 2015-05-12 DIAGNOSIS — Z9981 Dependence on supplemental oxygen: Secondary | ICD-10-CM | POA: Diagnosis not present

## 2015-05-12 DIAGNOSIS — Z7901 Long term (current) use of anticoagulants: Secondary | ICD-10-CM | POA: Diagnosis not present

## 2015-05-12 DIAGNOSIS — I4891 Unspecified atrial fibrillation: Secondary | ICD-10-CM | POA: Diagnosis not present

## 2015-05-12 DIAGNOSIS — Z5181 Encounter for therapeutic drug level monitoring: Secondary | ICD-10-CM | POA: Diagnosis not present

## 2015-05-12 DIAGNOSIS — S72112D Displaced fracture of greater trochanter of left femur, subsequent encounter for closed fracture with routine healing: Secondary | ICD-10-CM | POA: Diagnosis not present

## 2015-05-12 DIAGNOSIS — M62838 Other muscle spasm: Secondary | ICD-10-CM | POA: Diagnosis not present

## 2015-05-12 DIAGNOSIS — E119 Type 2 diabetes mellitus without complications: Secondary | ICD-10-CM | POA: Diagnosis not present

## 2015-05-12 DIAGNOSIS — I509 Heart failure, unspecified: Secondary | ICD-10-CM | POA: Diagnosis not present

## 2015-05-12 DIAGNOSIS — Z794 Long term (current) use of insulin: Secondary | ICD-10-CM | POA: Diagnosis not present

## 2015-05-14 DIAGNOSIS — Z9181 History of falling: Secondary | ICD-10-CM | POA: Diagnosis not present

## 2015-05-14 DIAGNOSIS — M62838 Other muscle spasm: Secondary | ICD-10-CM | POA: Diagnosis not present

## 2015-05-14 DIAGNOSIS — Z794 Long term (current) use of insulin: Secondary | ICD-10-CM | POA: Diagnosis not present

## 2015-05-14 DIAGNOSIS — I509 Heart failure, unspecified: Secondary | ICD-10-CM | POA: Diagnosis not present

## 2015-05-14 DIAGNOSIS — S72112D Displaced fracture of greater trochanter of left femur, subsequent encounter for closed fracture with routine healing: Secondary | ICD-10-CM | POA: Diagnosis not present

## 2015-05-14 DIAGNOSIS — Z7901 Long term (current) use of anticoagulants: Secondary | ICD-10-CM | POA: Diagnosis not present

## 2015-05-14 DIAGNOSIS — I4891 Unspecified atrial fibrillation: Secondary | ICD-10-CM | POA: Diagnosis not present

## 2015-05-14 DIAGNOSIS — Z5181 Encounter for therapeutic drug level monitoring: Secondary | ICD-10-CM | POA: Diagnosis not present

## 2015-05-14 DIAGNOSIS — E119 Type 2 diabetes mellitus without complications: Secondary | ICD-10-CM | POA: Diagnosis not present

## 2015-05-14 DIAGNOSIS — Z9981 Dependence on supplemental oxygen: Secondary | ICD-10-CM | POA: Diagnosis not present

## 2015-05-16 ENCOUNTER — Other Ambulatory Visit: Payer: Self-pay | Admitting: Cardiovascular Disease

## 2015-05-18 ENCOUNTER — Other Ambulatory Visit: Payer: Self-pay | Admitting: Family Medicine

## 2015-05-19 DIAGNOSIS — S72112D Displaced fracture of greater trochanter of left femur, subsequent encounter for closed fracture with routine healing: Secondary | ICD-10-CM | POA: Diagnosis not present

## 2015-05-19 DIAGNOSIS — Z9981 Dependence on supplemental oxygen: Secondary | ICD-10-CM | POA: Diagnosis not present

## 2015-05-19 DIAGNOSIS — Z5181 Encounter for therapeutic drug level monitoring: Secondary | ICD-10-CM | POA: Diagnosis not present

## 2015-05-19 DIAGNOSIS — I509 Heart failure, unspecified: Secondary | ICD-10-CM | POA: Diagnosis not present

## 2015-05-19 DIAGNOSIS — I4891 Unspecified atrial fibrillation: Secondary | ICD-10-CM | POA: Diagnosis not present

## 2015-05-19 DIAGNOSIS — E119 Type 2 diabetes mellitus without complications: Secondary | ICD-10-CM | POA: Diagnosis not present

## 2015-05-19 DIAGNOSIS — Z9181 History of falling: Secondary | ICD-10-CM | POA: Diagnosis not present

## 2015-05-19 DIAGNOSIS — Z794 Long term (current) use of insulin: Secondary | ICD-10-CM | POA: Diagnosis not present

## 2015-05-19 DIAGNOSIS — M62838 Other muscle spasm: Secondary | ICD-10-CM | POA: Diagnosis not present

## 2015-05-19 DIAGNOSIS — Z7901 Long term (current) use of anticoagulants: Secondary | ICD-10-CM | POA: Diagnosis not present

## 2015-05-20 ENCOUNTER — Encounter: Payer: Self-pay | Admitting: Pharmacist

## 2015-05-20 ENCOUNTER — Ambulatory Visit (INDEPENDENT_AMBULATORY_CARE_PROVIDER_SITE_OTHER): Payer: Medicare Other | Admitting: Pharmacist

## 2015-05-20 ENCOUNTER — Other Ambulatory Visit: Payer: Self-pay

## 2015-05-20 ENCOUNTER — Telehealth: Payer: Self-pay | Admitting: Pharmacist

## 2015-05-20 DIAGNOSIS — R300 Dysuria: Secondary | ICD-10-CM

## 2015-05-20 DIAGNOSIS — E11319 Type 2 diabetes mellitus with unspecified diabetic retinopathy without macular edema: Secondary | ICD-10-CM | POA: Diagnosis not present

## 2015-05-20 DIAGNOSIS — I480 Paroxysmal atrial fibrillation: Secondary | ICD-10-CM

## 2015-05-20 DIAGNOSIS — Z794 Long term (current) use of insulin: Secondary | ICD-10-CM

## 2015-05-20 LAB — POCT UA - MICROSCOPIC ONLY
CASTS, UR, LPF, POC: NEGATIVE
CRYSTALS, UR, HPF, POC: NEGATIVE
Mucus, UA: NEGATIVE
Yeast, UA: NEGATIVE

## 2015-05-20 LAB — POCT INR: INR: 1.8

## 2015-05-20 LAB — POCT URINALYSIS DIPSTICK
Bilirubin, UA: NEGATIVE
GLUCOSE UA: NEGATIVE
KETONES UA: NEGATIVE
Nitrite, UA: NEGATIVE
Protein, UA: NEGATIVE
RBC UA: NEGATIVE
SPEC GRAV UA: 1.02
UROBILINOGEN UA: NEGATIVE
pH, UA: 6

## 2015-05-20 LAB — POCT GLYCOSYLATED HEMOGLOBIN (HGB A1C): HEMOGLOBIN A1C: 7.1

## 2015-05-20 NOTE — Patient Instructions (Signed)
Anticoagulation Dose Instructions as of 05/20/2015      [name] Tue Wed Thu Fri Sat   New Dose 2.5 mg 2.5 mg 5 mg 2.5 mg 5 mg 2.5 mg 5 mg    Description        Take 1 tablet (5mg ) today Wednesday May 20, 2015. Then continue current dosing of 1/2 tablet (2.5mg ) on Sunday, Monday, Wednesday, and Friday. Take 1 tablet (5mg ) on Tuesday, Thursday, and Saturday.     INR today was 1.8

## 2015-05-20 NOTE — Telephone Encounter (Signed)
Notified Coralyn Mark about urinalysis and that we are sending for culture and sensitivity

## 2015-05-20 NOTE — Progress Notes (Signed)
See anticoagulation notes.

## 2015-05-22 DIAGNOSIS — Z5181 Encounter for therapeutic drug level monitoring: Secondary | ICD-10-CM | POA: Diagnosis not present

## 2015-05-22 DIAGNOSIS — I509 Heart failure, unspecified: Secondary | ICD-10-CM | POA: Diagnosis not present

## 2015-05-22 DIAGNOSIS — Z794 Long term (current) use of insulin: Secondary | ICD-10-CM | POA: Diagnosis not present

## 2015-05-22 DIAGNOSIS — S72112D Displaced fracture of greater trochanter of left femur, subsequent encounter for closed fracture with routine healing: Secondary | ICD-10-CM | POA: Diagnosis not present

## 2015-05-22 DIAGNOSIS — E119 Type 2 diabetes mellitus without complications: Secondary | ICD-10-CM | POA: Diagnosis not present

## 2015-05-22 DIAGNOSIS — Z7901 Long term (current) use of anticoagulants: Secondary | ICD-10-CM | POA: Diagnosis not present

## 2015-05-22 DIAGNOSIS — M62838 Other muscle spasm: Secondary | ICD-10-CM | POA: Diagnosis not present

## 2015-05-22 DIAGNOSIS — Z9181 History of falling: Secondary | ICD-10-CM | POA: Diagnosis not present

## 2015-05-22 DIAGNOSIS — I4891 Unspecified atrial fibrillation: Secondary | ICD-10-CM | POA: Diagnosis not present

## 2015-05-22 DIAGNOSIS — Z9981 Dependence on supplemental oxygen: Secondary | ICD-10-CM | POA: Diagnosis not present

## 2015-05-22 LAB — URINE CULTURE

## 2015-05-25 ENCOUNTER — Other Ambulatory Visit: Payer: Self-pay | Admitting: Family Medicine

## 2015-05-25 ENCOUNTER — Other Ambulatory Visit: Payer: Self-pay | Admitting: Pharmacist

## 2015-05-25 MED ORDER — PEN NEEDLES 31G X 6 MM MISC: Status: DC

## 2015-05-26 DIAGNOSIS — Z9181 History of falling: Secondary | ICD-10-CM | POA: Diagnosis not present

## 2015-05-26 DIAGNOSIS — Z5181 Encounter for therapeutic drug level monitoring: Secondary | ICD-10-CM | POA: Diagnosis not present

## 2015-05-26 DIAGNOSIS — Z9981 Dependence on supplemental oxygen: Secondary | ICD-10-CM | POA: Diagnosis not present

## 2015-05-26 DIAGNOSIS — I4891 Unspecified atrial fibrillation: Secondary | ICD-10-CM | POA: Diagnosis not present

## 2015-05-26 DIAGNOSIS — S72112D Displaced fracture of greater trochanter of left femur, subsequent encounter for closed fracture with routine healing: Secondary | ICD-10-CM | POA: Diagnosis not present

## 2015-05-26 DIAGNOSIS — Z794 Long term (current) use of insulin: Secondary | ICD-10-CM | POA: Diagnosis not present

## 2015-05-26 DIAGNOSIS — E119 Type 2 diabetes mellitus without complications: Secondary | ICD-10-CM | POA: Diagnosis not present

## 2015-05-26 DIAGNOSIS — Z7901 Long term (current) use of anticoagulants: Secondary | ICD-10-CM | POA: Diagnosis not present

## 2015-05-26 DIAGNOSIS — M62838 Other muscle spasm: Secondary | ICD-10-CM | POA: Diagnosis not present

## 2015-05-26 DIAGNOSIS — I509 Heart failure, unspecified: Secondary | ICD-10-CM | POA: Diagnosis not present

## 2015-05-27 ENCOUNTER — Telehealth: Payer: Self-pay | Admitting: Family Medicine

## 2015-05-27 NOTE — Telephone Encounter (Signed)
Patient aware of results.

## 2015-05-29 DIAGNOSIS — Z9981 Dependence on supplemental oxygen: Secondary | ICD-10-CM | POA: Diagnosis not present

## 2015-05-29 DIAGNOSIS — Z9181 History of falling: Secondary | ICD-10-CM | POA: Diagnosis not present

## 2015-05-29 DIAGNOSIS — Z7901 Long term (current) use of anticoagulants: Secondary | ICD-10-CM | POA: Diagnosis not present

## 2015-05-29 DIAGNOSIS — I509 Heart failure, unspecified: Secondary | ICD-10-CM | POA: Diagnosis not present

## 2015-05-29 DIAGNOSIS — M62838 Other muscle spasm: Secondary | ICD-10-CM | POA: Diagnosis not present

## 2015-05-29 DIAGNOSIS — Z794 Long term (current) use of insulin: Secondary | ICD-10-CM | POA: Diagnosis not present

## 2015-05-29 DIAGNOSIS — S72112D Displaced fracture of greater trochanter of left femur, subsequent encounter for closed fracture with routine healing: Secondary | ICD-10-CM | POA: Diagnosis not present

## 2015-05-29 DIAGNOSIS — I4891 Unspecified atrial fibrillation: Secondary | ICD-10-CM | POA: Diagnosis not present

## 2015-05-29 DIAGNOSIS — Z5181 Encounter for therapeutic drug level monitoring: Secondary | ICD-10-CM | POA: Diagnosis not present

## 2015-05-29 DIAGNOSIS — E119 Type 2 diabetes mellitus without complications: Secondary | ICD-10-CM | POA: Diagnosis not present

## 2015-06-04 ENCOUNTER — Other Ambulatory Visit: Payer: Self-pay

## 2015-06-04 MED ORDER — POTASSIUM CHLORIDE CRYS ER 20 MEQ PO TBCR: EXTENDED_RELEASE_TABLET | ORAL | Status: DC

## 2015-06-08 DIAGNOSIS — I509 Heart failure, unspecified: Secondary | ICD-10-CM | POA: Diagnosis not present

## 2015-06-08 DIAGNOSIS — M62838 Other muscle spasm: Secondary | ICD-10-CM | POA: Diagnosis not present

## 2015-06-10 ENCOUNTER — Encounter (INDEPENDENT_AMBULATORY_CARE_PROVIDER_SITE_OTHER): Payer: Self-pay

## 2015-06-10 ENCOUNTER — Ambulatory Visit (INDEPENDENT_AMBULATORY_CARE_PROVIDER_SITE_OTHER): Payer: Medicare Other | Admitting: Pharmacist

## 2015-06-10 VITALS — Wt 126.5 lb

## 2015-06-10 DIAGNOSIS — N63 Unspecified lump in breast: Secondary | ICD-10-CM | POA: Diagnosis not present

## 2015-06-10 DIAGNOSIS — N6320 Unspecified lump in the left breast, unspecified quadrant: Secondary | ICD-10-CM

## 2015-06-10 DIAGNOSIS — I480 Paroxysmal atrial fibrillation: Secondary | ICD-10-CM

## 2015-06-10 LAB — POCT INR: INR: 1.5

## 2015-06-10 NOTE — Patient Instructions (Signed)
Anticoagulation Dose Instructions as of 06/10/2015      Dorene Grebe Tue Wed Thu Fri Sat   New Dose 5 mg 2.5 mg 5 mg 2.5 mg 5 mg 2.5 mg 5 mg    Description        Take 1 tablet instead of 1/2 tablet today - Wednesday, December 14th, then start new dose of 1 tablet on sundays, tuesdays, thursdays and saturdays.  Take 1/2 tablet on mondays, wednesdays and fridays.     INR was 1.5 today

## 2015-06-10 NOTE — Progress Notes (Signed)
Patient reports that the lump on her left breast feels larger.  Referral sent for mammogram / diagnostic to [facility].

## 2015-06-16 ENCOUNTER — Other Ambulatory Visit: Payer: Self-pay | Admitting: Cardiovascular Disease

## 2015-06-23 ENCOUNTER — Other Ambulatory Visit: Payer: Self-pay | Admitting: Family Medicine

## 2015-06-23 ENCOUNTER — Encounter: Payer: Self-pay | Admitting: Pharmacist

## 2015-06-23 ENCOUNTER — Ambulatory Visit (INDEPENDENT_AMBULATORY_CARE_PROVIDER_SITE_OTHER): Payer: Medicare Other | Admitting: Pharmacist

## 2015-06-23 DIAGNOSIS — N6320 Unspecified lump in the left breast, unspecified quadrant: Secondary | ICD-10-CM

## 2015-06-23 DIAGNOSIS — I480 Paroxysmal atrial fibrillation: Secondary | ICD-10-CM | POA: Diagnosis not present

## 2015-06-23 LAB — POCT INR: INR: 1.1

## 2015-06-23 NOTE — Patient Instructions (Addendum)
Anticoagulation Dose Instructions as of 06/23/2015      [name] Tue Wed Thu Fri Sat   New Dose 2.5 mg 5 mg 2.5 mg 5 mg 2.5 mg 5 mg 5 mg    Description        Take warfarin 2 tablets today - Tuesday, December 27th and tomorrow - Wednesday, December 28th.  Then restart usual dose of 5mg  - take 1/2 on sundays, tuesdays and thursdays.  Take 1 tablet on mondays, wednesdays, fridays and saturdays.

## 2015-06-23 NOTE — Progress Notes (Signed)
Subjective:     Indication: atrial fibrillation Bleeding signs/symptoms: None Thromboembolic signs/symptoms: None  Missed Coumadin doses: patient is adamant that she has not missed any doses  Medication changes: no Dietary changes: yes - more green beans Bacterial/viral infection: no Other concerns: no  The following portions of the patient's history were reviewed and updated as appropriate: allergies, current medications and problem list.   Objective:    INR Today: 1.1 Current dose: warfarin 5mg  - take 1/2 sundays, tuesdays and thrusdays.  Take 1 tablet all other days.  Assessment:    Subtherapeutic INR for goal of 2-3   Plan:    1. New dose: take 2 tablets for next 2 days, then restart 1/2 tablet Sunday, Tuesday and thrusday and 1 tablet all other days   2. Next INR: 1 week

## 2015-06-26 ENCOUNTER — Ambulatory Visit
Admission: RE | Admit: 2015-06-26 | Discharge: 2015-06-26 | Disposition: A | Discharge status: Home / Self Care | Payer: Medicare Other | Source: Ambulatory Visit | Attending: Family Medicine | Admitting: Family Medicine

## 2015-06-26 DIAGNOSIS — N63 Unspecified lump in breast: Secondary | ICD-10-CM | POA: Diagnosis not present

## 2015-06-26 DIAGNOSIS — N6320 Unspecified lump in the left breast, unspecified quadrant: Secondary | ICD-10-CM

## 2015-06-27 ENCOUNTER — Encounter (INDEPENDENT_AMBULATORY_CARE_PROVIDER_SITE_OTHER): Payer: Medicare Other | Admitting: Family Medicine

## 2015-06-27 DIAGNOSIS — S72112D Displaced fracture of greater trochanter of left femur, subsequent encounter for closed fracture with routine healing: Secondary | ICD-10-CM

## 2015-06-27 DIAGNOSIS — I4891 Unspecified atrial fibrillation: Secondary | ICD-10-CM

## 2015-06-27 DIAGNOSIS — I509 Heart failure, unspecified: Secondary | ICD-10-CM

## 2015-06-27 DIAGNOSIS — M62838 Other muscle spasm: Secondary | ICD-10-CM

## 2015-07-03 ENCOUNTER — Other Ambulatory Visit: Payer: Self-pay | Admitting: Family Medicine

## 2015-07-03 NOTE — Telephone Encounter (Signed)
rx called into pharmacy

## 2015-07-03 NOTE — Telephone Encounter (Signed)
Last filled 05/04/15, last seen 04/22/15. Route to pool, call in at [facility]

## 2015-07-08 ENCOUNTER — Ambulatory Visit (INDEPENDENT_AMBULATORY_CARE_PROVIDER_SITE_OTHER): Payer: Medicare Other | Admitting: Pharmacist

## 2015-07-08 DIAGNOSIS — I480 Paroxysmal atrial fibrillation: Secondary | ICD-10-CM

## 2015-07-08 LAB — POCT INR: INR: 1.1

## 2015-07-08 NOTE — Patient Instructions (Addendum)
Anticoagulation Dose Instructions as of 07/08/2015      Dorene Grebe Tue Wed Thu Fri Sat   07/08/2015 thru 07/11/2015    10 mg  2 tablets 10 mg  2 tablets 5 mg  1  tablet 5 mg  1  tablet   Start this dose 07/12/2015 and continue until your next visit 5 mg  1  tablet 5 mg  1  tablet 5 mg  1  tablet 5 mg  1  tablet 5 mg  1  tablet 5 mg  1  tablet 5 mg  1  tablet    Description        Take warfarin 2 tablets today - Wednesday, January 11th and tomorrow, Thursday, January 12th.  Then start new dose of warfarin 5mg  take 1 tablet once a day.   INR was 1.1 today (too thick)

## 2015-07-09 DIAGNOSIS — M62838 Other muscle spasm: Secondary | ICD-10-CM | POA: Diagnosis not present

## 2015-07-09 DIAGNOSIS — I509 Heart failure, unspecified: Secondary | ICD-10-CM | POA: Diagnosis not present

## 2015-07-23 ENCOUNTER — Other Ambulatory Visit: Payer: Self-pay | Admitting: Cardiovascular Disease

## 2015-07-23 ENCOUNTER — Ambulatory Visit (INDEPENDENT_AMBULATORY_CARE_PROVIDER_SITE_OTHER): Payer: Medicare Other | Admitting: Pharmacist

## 2015-07-23 DIAGNOSIS — D6489 Other specified anemias: Secondary | ICD-10-CM

## 2015-07-23 DIAGNOSIS — R791 Abnormal coagulation profile: Secondary | ICD-10-CM | POA: Diagnosis not present

## 2015-07-23 LAB — POCT INR: INR: 5.7

## 2015-07-23 NOTE — Progress Notes (Signed)
Subjective:     Indication: atrial fibrillation Bleeding signs/symptoms: Yes - dark stools though this also could be related to iron supplement she takes tid Thromboembolic signs/symptoms: None  Missed Coumadin doses: None Medication changes: no Dietary changes: no Bacterial/viral infection: no Other concerns: no   Objective:    INR Today: 5.7 Current dose: warfarin 5mg  daily  Assessment:    Supratherapeutic INR for goal of 2-3   INR has fluctuated quite a bit lately.  Patient has declined NOAC's in past due to cost.    Plan:    1. New dose: No warfarin for next 2 days, then take 1/2 tablet Saturday, then change warfarin to 1/2 tablet MWF and 1 tablet all other days.   2. Next INR: 1 week    Orders Placed This Encounter  Procedures  . CBC with Differential/Platelet

## 2015-07-23 NOTE — Patient Instructions (Signed)
Anticoagulation Dose Instructions as of 07/23/2015      Dorene Grebe Tue Wed Thu Fri Sat   01/26 thru 01/28     Hold Hold 2.5 mg 1/2   01/30 thru 02/06 5 mg 1 2.5 mg 1/2 5 mg 1 2.5 mg 1/2 5mg  1 2.5 mg 1/2 5 mg 1    Description        No warfarin for 2 days, then decrease dose to 1/2 tablet on Monday, Wednesdays and Fridays and 1 tablet all other days.

## 2015-07-24 LAB — CBC WITH DIFFERENTIAL/PLATELET
BASOS ABS: 0.1 10*3/uL (ref 0.0–0.2)
BASOS: 1 %
EOS (ABSOLUTE): 0.1 10*3/uL (ref 0.0–0.4)
Eos: 2 %
Hematocrit: 36.9 % (ref 34.0–46.6)
Hemoglobin: 12.1 g/dL (ref 11.1–15.9)
IMMATURE GRANS (ABS): 0 10*3/uL (ref 0.0–0.1)
Immature Granulocytes: 0 %
LYMPHS: 21 %
Lymphocytes Absolute: 1.3 10*3/uL (ref 0.7–3.1)
MCH: 31.3 pg (ref 26.6–33.0)
MCHC: 32.8 g/dL (ref 31.5–35.7)
MCV: 96 fL (ref 79–97)
MONOS ABS: 0.6 10*3/uL (ref 0.1–0.9)
Monocytes: 9 %
NEUTROS ABS: 4.1 10*3/uL (ref 1.4–7.0)
Neutrophils: 67 %
PLATELETS: 265 10*3/uL (ref 150–379)
RBC: 3.86 x10E6/uL (ref 3.77–5.28)
RDW: 14 % (ref 12.3–15.4)
WBC: 6.1 10*3/uL (ref 3.4–10.8)

## 2015-07-24 MED ORDER — FERROUS SULFATE 325 (65 FE) MG PO TABS: 325.0000 mg | ORAL_TABLET | Freq: Every day | ORAL | Status: DC

## 2015-07-24 NOTE — Addendum Note (Signed)
Addended by: Cherre Robins on: 07/24/2015 09:32 AM   Modules accepted: Orders

## 2015-07-30 ENCOUNTER — Ambulatory Visit (INDEPENDENT_AMBULATORY_CARE_PROVIDER_SITE_OTHER): Payer: Medicare Other

## 2015-07-30 ENCOUNTER — Encounter: Payer: Self-pay | Admitting: Pharmacist

## 2015-07-30 ENCOUNTER — Ambulatory Visit (INDEPENDENT_AMBULATORY_CARE_PROVIDER_SITE_OTHER): Payer: Medicare Other | Admitting: Family Medicine

## 2015-07-30 ENCOUNTER — Encounter: Payer: Self-pay | Admitting: Family Medicine

## 2015-07-30 VITALS — BP 109/66 | HR 70 | Temp 97.3°F | Ht 60.0 in | Wt 125.2 lb

## 2015-07-30 DIAGNOSIS — K5900 Constipation, unspecified: Secondary | ICD-10-CM

## 2015-07-30 DIAGNOSIS — R109 Unspecified abdominal pain: Secondary | ICD-10-CM

## 2015-07-30 DIAGNOSIS — K641 Second degree hemorrhoids: Secondary | ICD-10-CM

## 2015-07-30 DIAGNOSIS — I480 Paroxysmal atrial fibrillation: Secondary | ICD-10-CM

## 2015-07-30 LAB — POCT INR: INR: 2.3

## 2015-07-30 MED ORDER — HYDROCORTISONE ACETATE 25 MG RE SUPP: 25.0000 mg | Freq: Two times a day (BID) | RECTAL | Status: AC | PRN

## 2015-07-30 MED ORDER — POLYETHYLENE GLYCOL 3350 17 GM/SCOOP PO POWD: 17.0000 g | Freq: Every day | ORAL | Status: DC | PRN

## 2015-07-30 NOTE — Progress Notes (Signed)
BP 109/66 mmHg  Pulse 70  Temp(Src) 97.3 F (36.3 C) (Oral)  Ht 5' (1.524 m)  Wt 125 lb 3.2 oz (56.79 kg)  BMI 24.45 kg/m2   Subjective:    Patient ID: [name], female    DOB: 11-Sep-1927, 82 y.o.   MRN: [id number]  HPI: [name] is a 82 y.o. female presenting on 07/30/2015 for Coagulation Disorder; Diarrhea & constipation; and Abdominal Cramping   HPI Abdominal cramping and diarrhea and constipation Patient has had intermittent abdominal cramping and diarrhea and constipation. She just has diffuse abdominal issues and was concerned about that intermittently. She says she does get backed up a lot since she's been on the iron or iron was just most recently reduced from 3 pills a day to one pill a day and that has improved her symptoms. She has started to loosen up and having more stools over the past couple days. She has noticed some bright red blood in her stool and some soreness in her bottom. The bright red blood is more from when she wipes.  Proximal A. Fib Patient is coming in today for a recheck on her INR for her A. Fib that she is currently on. Her INR came back as 2.3 today. She denies any issues with increased bruising or bleeding besides the hemorrhoids from her rectum that she's been having.  Relevant past medical, surgical, family and social history reviewed and updated as indicated. Interim medical history since our last visit reviewed. Allergies and medications reviewed and updated.  Review of Systems  Constitutional: Negative for fever and chills.  HENT: Negative for congestion, ear discharge and ear pain.   Eyes: Negative for redness and visual disturbance.  Respiratory: Negative for chest tightness and shortness of breath.   Cardiovascular: Negative for chest pain and leg swelling.  Gastrointestinal: Positive for abdominal pain, diarrhea, constipation, anal bleeding and rectal pain. Negative for nausea, vomiting and abdominal distention.  Genitourinary:  Negative for dysuria and difficulty urinating.  Musculoskeletal: Negative for back pain and gait problem.  Skin: Negative for rash.  Neurological: Negative for dizziness, light-headedness and headaches.  Psychiatric/Behavioral: Negative for behavioral problems and agitation.  All other systems reviewed and are negative.   Per HPI unless specifically indicated above     Medication List       This list is accurate as of: 07/30/15  2:20 PM.  Always use your most recent med list.               ACCU-CHEK AVIVA PLUS test strip  Generic drug:  glucose blood  USE TO CHECK GLUCOSE UP TO 4 TIMES DAILY     ALPRAZolam 0.25 MG tablet  Commonly known as:  XANAX  TAKE 1/2 TO 1 TABLET 2 TIMES A DAY AS NEEDED FOR ANXIETY     amiodarone 200 MG tablet  Commonly known as:  PACERONE  Take 1/2 tablet daily. Please keep upcoming appointment     atorvastatin 20 MG tablet  Commonly known as:  LIPITOR  Take 1 tablet (20 mg total) by mouth every evening.     Azelastine HCl 0.15 % Soln  Place 1 spray into both nostrils 2 (two) times daily as needed (congestion).     baclofen 10 MG tablet  Commonly known as:  LIORESAL  Take 0.5 tablets (5 mg total) by mouth every 8 (eight) hours as needed for muscle spasms.     budesonide-formoterol 160-4.5 MCG/ACT inhaler  Commonly known as:  SYMBICORT  Inhale 1  puff into the lungs 2 (two) times daily.     docusate sodium 100 MG capsule  Commonly known as:  COLACE  Take 100 mg by mouth 2 (two) times daily.     feeding supplement (GLUCERNA SHAKE) Liqd  Take 237 mLs by mouth 2 (two) times daily between meals.     ferrous sulfate 325 (65 FE) MG tablet  Take 1 tablet (325 mg total) by mouth daily with breakfast.     fluticasone 50 MCG/ACT nasal spray  Commonly known as:  FLONASE  Place 1 spray into both nostrils 2 (two) times daily.     furosemide 40 MG tablet  Commonly known as:  LASIX  Take 1 tablet (40 mg total) by mouth daily. May take extra dose if  weight increases 3.5 pounds in 24 hrs as needed for fluid and edema.     glucosamine-chondroitin 500-400 MG tablet  Take 1 tablet by mouth 2 (two) times daily.     HUMALOG KWIKPEN 100 UNIT/ML KiwkPen  Generic drug:  insulin lispro  INJECT UP TO 8 UNITS BEFORE MEALS AS DIRECTED BY SLIDING SCALE. MAX 24 UNITS PER DAY     hydrocortisone 25 MG suppository  Commonly known as:  ANUSOL-HC  Place 1 suppository (25 mg total) rectally 2 (two) times daily as needed for hemorrhoids or itching.     Ipratropium-Albuterol 20-100 MCG/ACT Aers respimat  Commonly known as:  COMBIVENT  Inhale 1 puff into the lungs every 6 (six) hours as needed for wheezing or shortness of breath.     LEVEMIR FLEXTOUCH 100 UNIT/ML Pen  Generic drug:  Insulin Detemir  INJECT 7 UNITS SQ EVERY MORNING     metoprolol succinate 25 MG 24 hr tablet  Commonly known as:  TOPROL-XL  Take 0.5 tablets (12.5 mg total) by mouth daily.     NIFEDICAL XL 30 MG 24 hr tablet  Generic drug:  NIFEdipine  Take 1 tablet (30 mg total) by mouth daily.     nitroGLYCERIN 0.4 MG SL tablet  Commonly known as:  NITROSTAT  Place 1 tablet (0.4 mg total) under the tongue every 5 (five) minutes as needed for chest pain.     pantoprazole 40 MG tablet  Commonly known as:  PROTONIX  Take 1 tablet (40 mg total) by mouth daily.     Pen Needles 31G X 6 MM Misc  Use to administer insulin up to qid     polyethylene glycol powder powder  Commonly known as:  GLYCOLAX/MIRALAX  Take 17 g by mouth daily as needed.     potassium chloride SA 20 MEQ tablet  Commonly known as:  K-DUR,KLOR-CON  Take 1 tablet (20 mEq total) by mouth daily.     PRESERVISION/LUTEIN Caps  Take 1 capsule by mouth daily.     PRODIGY TWIST TOP LANCETS 28G Misc  CHECK BLOOD SUGAR UP TO 4 TIMES A DAY     simethicone 80 MG chewable tablet  Commonly known as:  MYLICON  Chew 80 mg by mouth at bedtime as needed for flatulence.     traMADol 50 MG tablet  Commonly known as:   ULTRAM  Take 1 tablet (50 mg total) by mouth every 12 (twelve) hours as needed.     vitamin C 500 MG tablet  Commonly known as:  ASCORBIC ACID  Take 500 mg by mouth at bedtime.     VITAMIN D PO  Take 1 capsule by mouth daily.     warfarin 5 MG tablet  Commonly known as:  COUMADIN  TAKE (1) TABLET DAILY AFTER SUPPER.           Objective:    BP 109/66 mmHg  Pulse 70  Temp(Src) 97.3 F (36.3 C) (Oral)  Ht 5' (1.524 m)  Wt 125 lb 3.2 oz (56.79 kg)  BMI 24.45 kg/m2  Wt Readings from Last 3 Encounters:  07/30/15 125 lb 3.2 oz (56.79 kg)  06/10/15 126 lb 8 oz (57.38 kg)  05/05/15 125 lb (56.7 kg)    Physical Exam  Constitutional: She is oriented to person, place, and time. She appears well-developed and well-nourished. No distress.  Eyes: Conjunctivae and EOM are normal. Pupils are equal, round, and reactive to light.  Neck: Neck supple. No thyromegaly present.  Cardiovascular: Normal rate, regular rhythm, normal heart sounds and intact distal pulses.   No murmur heard. Pulmonary/Chest: Effort normal and breath sounds normal. No respiratory distress. She has no wheezes.  Abdominal: Soft. Bowel sounds are normal. She exhibits no distension and no mass. There is no hepatosplenomegaly. There is generalized tenderness (Mild diffuse pain). There is no rigidity, no rebound, no guarding, no CVA tenderness, no tenderness at McBurney's point and negative Murphy's sign.  Genitourinary: Rectal exam shows external hemorrhoid (Patient has external hemorrhoid and a borderline start to prolapse. It is not sticking out at this point.) and tenderness.  Musculoskeletal: Normal range of motion. She exhibits no edema or tenderness.  Lymphadenopathy:    She has no cervical adenopathy.  Neurological: She is alert and oriented to person, place, and time. Coordination normal.  Skin: Skin is warm and dry. No rash noted. She is not diaphoretic.  Psychiatric: She has a normal mood and affect. Her  behavior is normal.  Nursing note and vitals reviewed.   Results for orders placed or performed in visit on 07/30/15  POCT INR  Result Value Ref Range   INR 2.3       Assessment & Plan:   Problem List Items Addressed This Visit    None    Visit Diagnoses    Abdominal cramping    -  Primary    Intermittent constipation since iron pills. try miralax and docusate as needed.    Relevant Medications    polyethylene glycol powder (GLYCOLAX/MIRALAX) powder    Other Relevant Orders    DG Abd 1 View (Completed)    PAF (paroxysmal atrial fibrillation) (HCC)        INR is normal, resume dose    Constipation, unspecified constipation type        Relevant Medications    polyethylene glycol powder (GLYCOLAX/MIRALAX) powder    Other Relevant Orders    DG Abd 1 View (Completed)    Second degree hemorrhoids        Relevant Medications    hydrocortisone (ANUSOL-HC) 25 MG suppository        Follow up plan: Return if symptoms worsen or fail to improve.  Counseling provided for all of the vaccine components Orders Placed This Encounter  Procedures  . DG Abd [place], MD [facility] 07/30/2015, 2:20 PM

## 2015-08-09 DIAGNOSIS — M62838 Other muscle spasm: Secondary | ICD-10-CM | POA: Diagnosis not present

## 2015-08-09 DIAGNOSIS — I509 Heart failure, unspecified: Secondary | ICD-10-CM | POA: Diagnosis not present

## 2015-08-11 ENCOUNTER — Other Ambulatory Visit: Payer: Self-pay | Admitting: Family Medicine

## 2015-08-12 ENCOUNTER — Other Ambulatory Visit: Payer: Self-pay

## 2015-08-12 MED ORDER — PANTOPRAZOLE SODIUM 40 MG PO TBEC: 40.0000 mg | DELAYED_RELEASE_TABLET | Freq: Every day | ORAL | Status: DC

## 2015-08-26 ENCOUNTER — Ambulatory Visit (INDEPENDENT_AMBULATORY_CARE_PROVIDER_SITE_OTHER): Payer: Medicare Other | Admitting: Pharmacist

## 2015-08-26 DIAGNOSIS — I480 Paroxysmal atrial fibrillation: Secondary | ICD-10-CM | POA: Diagnosis not present

## 2015-08-26 LAB — POCT INR: INR: 1

## 2015-08-26 NOTE — Patient Instructions (Addendum)
Anticoagulation Dose Instructions as of 08/26/2015      Dorene Grebe Tue Wed Thu Fri Sat       3/1 7.5 mg 1 and 1/2 tablets  3/2 7.5 mg 1 and 1/2 tablets 3/3 2.5 mg 1/2 tablet 3/4 5 mg 1 tablet    3/5 5 mg 1 tablet  3/6 2.5 mg 1/2 tablet 3/7 5 mg 1 tablet 3/8 5 mg 1 tablet 3/9 5 mg 1 tablet 3/10 2.5 mg 1/2 tablet 3/11 5 mg 1 tablet    3/12 5mg  1 tablet  3/13 2.5mg   1/2 tablet 3/14 5mg  1 tablet 3/15 5mg  1 tablet 3/16 5mg  1 tablet 3/17 2.5mg  1/2 tablet 3/18 5mg  1 tablet    Description        Take 1 and 1/2 tablets today (08/26/15) and tomorrow (08/27/15).  Then increase warfarin dose to 1/2 on just Mondays and Fridays and 1 tablet all other days.      INR was 1.0 today (too thick)

## 2015-08-31 ENCOUNTER — Other Ambulatory Visit: Payer: Self-pay | Admitting: Family

## 2015-09-06 DIAGNOSIS — I509 Heart failure, unspecified: Secondary | ICD-10-CM | POA: Diagnosis not present

## 2015-09-06 DIAGNOSIS — M62838 Other muscle spasm: Secondary | ICD-10-CM | POA: Diagnosis not present

## 2015-09-09 ENCOUNTER — Ambulatory Visit (INDEPENDENT_AMBULATORY_CARE_PROVIDER_SITE_OTHER): Payer: Medicare Other | Admitting: Pharmacist

## 2015-09-09 DIAGNOSIS — D492 Neoplasm of unspecified behavior of bone, soft tissue, and skin: Secondary | ICD-10-CM | POA: Diagnosis not present

## 2015-09-09 DIAGNOSIS — I480 Paroxysmal atrial fibrillation: Secondary | ICD-10-CM

## 2015-09-09 LAB — COAGUCHEK XS/INR WAIVED
INR: 1.6 — AB (ref 0.9–1.1)
PROTHROMBIN TIME: 19.1 s

## 2015-09-09 NOTE — Patient Instructions (Signed)
Anticoagulation Dose Instructions as of 09/09/2015      [name] Tue Wed Thu Fri Sat                        5 mg 2.5 mg 5 mg 5 mg 5 mg 2.5 mg 5 mg    Description        Take 1 and 1/2 tablets today (09/09/2015) Then continue current warfarin dose of 1/2 on just Mondays and Fridays and 1 tablet all other days.

## 2015-09-09 NOTE — Progress Notes (Signed)
Subjective:     Indication: atrial fibrillation Bleeding signs/symptoms: None Thromboembolic signs/symptoms: None  Missed Coumadin doses: None Medication changes: no Dietary changes: no Bacterial/viral infection: no Other concerns: yes - patient has growth on inside of the left side of the bridge of her nose close to left eye.  She is concern because it is red and has been bother her more often lately  The following portions of the patient's history were reviewed and updated as appropriate: allergies, current medications and problem list.   Objective:    INR Today: 1.6 (improved from 1.1 at last visit Current dose:  warfarin 5mg  - take 1/2 tablet mondays and fridays and 1 tablet all other days.  Assessment:    Therapeutic INR for goal of 2-3   Plan:    1. New dose: take 1 and 1/2 tablets today then resume current dose   2. Next INR: 1 month   3.  Dr Laurance Flatten looked at growth on bridge of nose and would like patient to see dermatologist for evaluation  Referral sent.

## 2015-09-10 ENCOUNTER — Other Ambulatory Visit: Payer: Self-pay

## 2015-09-10 NOTE — Patient Outreach (Signed)
[facility]) Care Management  09/10/2015  [name] [birth year] [id number]   Telephone call to patient for high risk referral from [facility].  Spoke with patient explained purpose for the call.  Patient reports she has enough care from her doctor and does not need anymore nurses and hung up.  Plan: RN Health Coach will send letter and brochure for future reference. RN Health Coach will notify care management assistant to close case.   RN Health Coach will notify physician of case closure.    Jone Baseman, RN, MSN [place] [phone]

## 2015-09-22 ENCOUNTER — Other Ambulatory Visit: Payer: Self-pay | Admitting: Family Medicine

## 2015-09-23 ENCOUNTER — Other Ambulatory Visit: Payer: Self-pay | Admitting: *Deleted

## 2015-09-23 ENCOUNTER — Other Ambulatory Visit: Payer: Medicare Other

## 2015-09-23 DIAGNOSIS — R300 Dysuria: Secondary | ICD-10-CM

## 2015-09-23 LAB — MICROSCOPIC EXAMINATION
Bacteria, UA: NONE SEEN
RBC, UA: NONE SEEN /hpf (ref 0–?)

## 2015-09-23 LAB — URINALYSIS, COMPLETE
Bilirubin, UA: NEGATIVE
Glucose, UA: NEGATIVE
KETONES UA: NEGATIVE
Nitrite, UA: NEGATIVE
RBC, UA: NEGATIVE
SPEC GRAV UA: 1.01 (ref 1.005–1.030)
Urobilinogen, Ur: 0.2 mg/dL (ref 0.2–1.0)
pH, UA: 5.5 (ref 5.0–7.5)

## 2015-09-25 ENCOUNTER — Other Ambulatory Visit: Payer: Self-pay | Admitting: *Deleted

## 2015-09-25 LAB — URINE CULTURE

## 2015-09-25 MED ORDER — AMOXICILLIN-POT CLAVULANATE 875-125 MG PO TABS: 1.0000 | ORAL_TABLET | Freq: Two times a day (BID) | ORAL | Status: DC

## 2015-10-05 DIAGNOSIS — L738 Other specified follicular disorders: Secondary | ICD-10-CM | POA: Diagnosis not present

## 2015-10-05 DIAGNOSIS — L570 Actinic keratosis: Secondary | ICD-10-CM | POA: Diagnosis not present

## 2015-10-07 DIAGNOSIS — I509 Heart failure, unspecified: Secondary | ICD-10-CM | POA: Diagnosis not present

## 2015-10-07 DIAGNOSIS — M62838 Other muscle spasm: Secondary | ICD-10-CM | POA: Diagnosis not present

## 2015-10-08 ENCOUNTER — Other Ambulatory Visit: Payer: Medicare Other

## 2015-10-08 DIAGNOSIS — R300 Dysuria: Secondary | ICD-10-CM

## 2015-10-08 LAB — URINALYSIS, COMPLETE
Bilirubin, UA: NEGATIVE
GLUCOSE, UA: NEGATIVE
KETONES UA: NEGATIVE
Leukocytes, UA: NEGATIVE
NITRITE UA: NEGATIVE
PROTEIN UA: NEGATIVE
RBC, UA: NEGATIVE
SPEC GRAV UA: 1.01 (ref 1.005–1.030)
Urobilinogen, Ur: 0.2 mg/dL (ref 0.2–1.0)
pH, UA: 5 (ref 5.0–7.5)

## 2015-10-08 LAB — MICROSCOPIC EXAMINATION
Bacteria, UA: NONE SEEN
Epithelial Cells (non renal): NONE SEEN /hpf (ref 0–10)
RBC MICROSCOPIC, UA: NONE SEEN /HPF (ref 0–?)

## 2015-10-14 ENCOUNTER — Ambulatory Visit (INDEPENDENT_AMBULATORY_CARE_PROVIDER_SITE_OTHER): Payer: Medicare Other | Admitting: Pharmacist

## 2015-10-14 DIAGNOSIS — R300 Dysuria: Secondary | ICD-10-CM | POA: Diagnosis not present

## 2015-10-14 DIAGNOSIS — I480 Paroxysmal atrial fibrillation: Secondary | ICD-10-CM | POA: Diagnosis not present

## 2015-10-14 LAB — COAGUCHEK XS/INR WAIVED
INR: 1.1 (ref 0.9–1.1)
PROTHROMBIN TIME: 13.3 s

## 2015-10-14 NOTE — Patient Instructions (Signed)
Anticoagulation Dose Instructions as of 10/14/2015      [name] Tue Wed Thu Fri Sat   New Dose 5 mg 5 mg 5 mg 5 mg 5 mg 5 mg 5 mg    Description        Increase warfarin to 5mg  to 1 tablet daily.      INR was 1.1 today   [employer] will start filling warfarin in your medication packages.

## 2015-10-15 LAB — URINE CULTURE

## 2015-10-16 ENCOUNTER — Telehealth: Payer: Self-pay | Admitting: Pulmonary Disease

## 2015-10-16 NOTE — Telephone Encounter (Signed)
Spoke with patient-states for the past 3-4 months she has been in and out of the hospital due to UTI's and then ended up having an injury due to a fall. Pt has been trying to get in with [facility] for sometime now; I was able to add patient to 12-09-15 at 3:15pm. Nothing more needed at this time.

## 2015-11-04 ENCOUNTER — Ambulatory Visit (INDEPENDENT_AMBULATORY_CARE_PROVIDER_SITE_OTHER): Payer: Medicare Other | Admitting: Family Medicine

## 2015-11-04 ENCOUNTER — Other Ambulatory Visit: Payer: Self-pay | Admitting: Family Medicine

## 2015-11-04 ENCOUNTER — Encounter: Payer: Self-pay | Admitting: Family Medicine

## 2015-11-04 VITALS — BP 154/68 | HR 58 | Temp 97.5°F | Ht 60.0 in | Wt 122.8 lb

## 2015-11-04 DIAGNOSIS — M159 Polyosteoarthritis, unspecified: Secondary | ICD-10-CM

## 2015-11-04 DIAGNOSIS — I70209 Unspecified atherosclerosis of native arteries of extremities, unspecified extremity: Secondary | ICD-10-CM

## 2015-11-04 DIAGNOSIS — E785 Hyperlipidemia, unspecified: Secondary | ICD-10-CM

## 2015-11-04 DIAGNOSIS — R300 Dysuria: Secondary | ICD-10-CM

## 2015-11-04 DIAGNOSIS — N183 Chronic kidney disease, stage 3 (moderate): Secondary | ICD-10-CM

## 2015-11-04 DIAGNOSIS — E559 Vitamin D deficiency, unspecified: Secondary | ICD-10-CM | POA: Diagnosis not present

## 2015-11-04 DIAGNOSIS — M150 Primary generalized (osteo)arthritis: Secondary | ICD-10-CM

## 2015-11-04 DIAGNOSIS — E1122 Type 2 diabetes mellitus with diabetic chronic kidney disease: Secondary | ICD-10-CM | POA: Diagnosis not present

## 2015-11-04 DIAGNOSIS — I509 Heart failure, unspecified: Secondary | ICD-10-CM | POA: Diagnosis not present

## 2015-11-04 DIAGNOSIS — J439 Emphysema, unspecified: Secondary | ICD-10-CM | POA: Diagnosis not present

## 2015-11-04 DIAGNOSIS — J029 Acute pharyngitis, unspecified: Secondary | ICD-10-CM | POA: Diagnosis not present

## 2015-11-04 DIAGNOSIS — E11319 Type 2 diabetes mellitus with unspecified diabetic retinopathy without macular edema: Secondary | ICD-10-CM | POA: Diagnosis not present

## 2015-11-04 DIAGNOSIS — I10 Essential (primary) hypertension: Secondary | ICD-10-CM | POA: Diagnosis not present

## 2015-11-04 DIAGNOSIS — Z794 Long term (current) use of insulin: Secondary | ICD-10-CM | POA: Diagnosis not present

## 2015-11-04 DIAGNOSIS — I480 Paroxysmal atrial fibrillation: Secondary | ICD-10-CM | POA: Diagnosis not present

## 2015-11-04 LAB — URINALYSIS, COMPLETE
Bilirubin, UA: NEGATIVE
Glucose, UA: NEGATIVE
Ketones, UA: NEGATIVE
LEUKOCYTES UA: NEGATIVE
Nitrite, UA: NEGATIVE
PH UA: 5.5 (ref 5.0–7.5)
Protein, UA: NEGATIVE
RBC UA: NEGATIVE
Specific Gravity, UA: 1.01 (ref 1.005–1.030)
Urobilinogen, Ur: 0.2 mg/dL (ref 0.2–1.0)

## 2015-11-04 LAB — MICROSCOPIC EXAMINATION
Bacteria, UA: NONE SEEN
EPITHELIAL CELLS (NON RENAL): NONE SEEN /HPF (ref 0–10)
RBC MICROSCOPIC, UA: NONE SEEN /HPF (ref 0–?)

## 2015-11-04 LAB — RAPID STREP SCREEN (MED CTR MEBANE ONLY): Strep Gp A Ag, IA W/Reflex: NEGATIVE

## 2015-11-04 LAB — COAGUCHEK XS/INR WAIVED
INR: 3.3 — AB (ref 0.9–1.1)
Prothrombin Time: 39.1 s

## 2015-11-04 LAB — CULTURE, GROUP A STREP

## 2015-11-04 LAB — BAYER DCA HB A1C WAIVED: HB A1C: 6.9 % (ref <7.0)

## 2015-11-04 NOTE — Progress Notes (Signed)
Subjective:    Patient ID: [name], female    DOB: August 26, 1927, 82 y.o.   MRN: [id number]  HPI Patient is here today for her 4 month follow up on her hypertension, hyperlipidemia, and diabetes. Patient is also complaining with a sore throat, and congestion.This is the patient's only complaint today. She takes multiple medicines unfortunately for multiple problems primarily cardiac and lung in nature. She has had some urinary tract symptoms complaints but the urines that we have done have always been negative. Despite her multiple medical problems she has a positive attitude about life and living and I am in at a ration about this. We will check a urinalysis today. In her review of systems she denied any chest pain shortness of breath trouble swallowing heartburn indigestion nausea vomiting diarrhea or blood in the stool. She uses her cane for ambulation. He sees a cardiologist regularly and is followed by the clinical pharmacist for her Coumadin therapy. She is been intolerant to multiple statins. The patient denies any chest pain. She case he has some right arm pain but no left arm pain. She denies any shortness of breath. She has no problems with heartburn or indigestion and [place] on eating her highly spiced [place] food. Her bowels are moving regularly although they're dark in color. She has occasional loose stool and then has normal stools and this is a normal for her. She does take iron. She is passing her water without problems but has had some frequency in the past. All urines checked have been normal and we will recheck another one today. She is not had any further falls or fractures. She is active around her house.   Review of Systems  Constitutional: Negative.   HENT: Positive for congestion and sore throat.   Eyes: Negative.   Respiratory: Negative.   Cardiovascular: Negative.   Gastrointestinal: Negative.   Endocrine: Negative.   Genitourinary: Negative.   Musculoskeletal:  Negative.   Skin: Negative.   Allergic/Immunologic: Negative.   Neurological: Negative.   Hematological: Negative.   Psychiatric/Behavioral: Negative.         Patient Active Problem List   Diagnosis Date Noted  . Intertrochanteric fracture of left hip ([place])   . Neck pain   . Postoperative anemia due to acute blood loss   . Hip fracture requiring operative repair ([facility]) 01/15/2015  . Preop cardiovascular exam 01/14/2015  . Hip fracture ([place]) 01/13/2015  . Osteopenia 01/05/2015  . Primary osteoarthritis involving multiple joints 06/12/2014  . CHF (congestive heart failure) ([place]) 12/28/2013  . Atypical chest pain 12/18/2013  . Upper airway cough syndrome 05/20/2013  . Pain in joint, ankle and foot 12/06/2012  . Olecranon bursitis of left elbow 08/26/2012  . Scalp lesion 08/26/2012  . Counseling regarding advanced directives 08/26/2012  . DM type 2 causing CKD stage 3 ([place]) 07/30/2012  . Sacral fracture, closed ([place]) 07/22/2012  . Ataxia 07/20/2012  . Weakness of both legs 07/20/2012  . Falls frequently 07/20/2012  . RBBB 08/25/2011  . CAD, remote RCA PCI, subsequently noted to be occluded, last cath 8/11 08/25/2011  . PVD (peripheral vascular disease), moderate carotid and LSCA disease 08/25/2011  . HTN (hypertension) 08/22/2011  . AK (actinic keratosis) 07/17/2011  . Shoulder pain 04/03/2011  . Gait instability 10/11/2010  . PAF, recurrance this admission, on chronic Amio/ Coumadin 07/06/2010  . COPD with emphysema ([place]) 06/16/2010  . Chronic respiratory failure with hypoxia ([place]) 06/07/2010  . HYPERLIPIDEMIA, statin intol 02/11/2010   Outpatient Encounter  Prescriptions as of 11/04/2015  Medication Sig  . ACCU-CHEK AVIVA PLUS test strip USE TO CHECK GLUCOSE UP TO 4 TIMES DAILY  . ALPRAZolam (XANAX) 0.25 MG tablet TAKE 1/2 TO 1 TABLET 2 TIMES A DAY AS NEEDED FOR ANXIETY  . amiodarone (PACERONE) 200 MG tablet Take 1/2 tablet daily. Please keep upcoming appointment  .  atorvastatin (LIPITOR) 20 MG tablet Take 1 tablet (20 mg total) by mouth every evening.  . Azelastine HCl 0.15 % SOLN Place 1 spray into both nostrils 2 (two) times daily as needed (congestion).  . baclofen (LIORESAL) 10 MG tablet Take 0.5 tablets (5 mg total) by mouth every 8 (eight) hours as needed for muscle spasms.  . budesonide-formoterol (SYMBICORT) 160-4.5 MCG/ACT inhaler Inhale 1 puff into the lungs 2 (two) times daily.  . Cholecalciferol (VITAMIN D PO) Take 1 capsule by mouth daily.   Marland Kitchen docusate sodium (COLACE) 100 MG capsule Take 100 mg by mouth 2 (two) times daily.  . feeding supplement, GLUCERNA SHAKE, (GLUCERNA SHAKE) LIQD Take 237 mLs by mouth 2 (two) times daily between meals.  . ferrous sulfate 325 (65 FE) MG tablet Take 1 tablet (325 mg total) by mouth daily with breakfast.  . fluticasone (FLONASE) 50 MCG/ACT nasal spray Place 1 spray into both nostrils 2 (two) times daily.  . furosemide (LASIX) 40 MG tablet Take 1 tablet (40 mg total) by mouth 2 (two) times daily.  Marland Kitchen glucosamine-chondroitin 500-400 MG tablet Take 1 tablet by mouth 2 (two) times daily.  Marland Kitchen HUMALOG KWIKPEN 100 UNIT/ML KiwkPen INJECT UP TO 8 UNITS BEFORE MEALS AS DIRECTED BY SLIDING SCALE. MAX 24 UNITS PER DAY  . hydrocortisone (ANUSOL-HC) 25 MG suppository Place 1 suppository (25 mg total) rectally 2 (two) times daily as needed for hemorrhoids or itching.  . Insulin Detemir (LEVEMIR FLEXTOUCH) 100 UNIT/ML Pen INJECT 7 UNITS SQ EVERY MORNING  . Insulin Pen Needle (PEN NEEDLES) 31G X 6 MM MISC Use to administer insulin up to qid  . Ipratropium-Albuterol (COMBIVENT) 20-100 MCG/ACT AERS respimat Inhale 1 puff into the lungs every 6 (six) hours as needed for wheezing or shortness of breath.  . metoprolol succinate (TOPROL-XL) 25 MG 24 hr tablet Take 0.5 tablets (12.5 mg total) by mouth daily.  . Multiple Vitamins-Minerals (PRESERVISION/LUTEIN) CAPS Take 1 capsule by mouth daily.   Marland Kitchen NIFEDICAL XL 30 MG 24 hr tablet Take 1  tablet (30 mg total) by mouth daily.  . nitroGLYCERIN (NITROSTAT) 0.4 MG SL tablet Place 1 tablet (0.4 mg total) under the tongue every 5 (five) minutes as needed for chest pain.  . pantoprazole (PROTONIX) 40 MG tablet Take 1 tablet (40 mg total) by mouth daily.  . polyethylene glycol powder (GLYCOLAX/MIRALAX) powder Take 17 g by mouth daily as needed.  . potassium chloride SA (K-DUR,KLOR-CON) 20 MEQ tablet TAKE 1 TABLET DAILY  . PRODIGY TWIST TOP LANCETS 28G MISC CHECK BLOOD SUGAR UP TO 4 TIMES A DAY  . simethicone (MYLICON) 80 MG chewable tablet Chew 80 mg by mouth at bedtime as needed for flatulence.  . traMADol (ULTRAM) 50 MG tablet Take 1 tablet (50 mg total) by mouth every 12 (twelve) hours as needed. (Patient taking differently: Take 25 mg by mouth at bedtime. )  . vitamin C (ASCORBIC ACID) 500 MG tablet Take 500 mg by mouth at bedtime.   Marland Kitchen warfarin (COUMADIN) 5 MG tablet TAKE (1) TABLET DAILY AFTER SUPPER.  . [DISCONTINUED] amoxicillin-clavulanate (AUGMENTIN) 875-125 MG tablet Take 1 tablet by mouth 2 (  two) times daily.   No facility-administered encounter medications on file as of 11/04/2015.       Objective:   Physical Exam  Constitutional: She is oriented to person, place, and time. She appears well-developed and well-nourished. No distress.  Pleasant and alert and mentally with it.  HENT:  Head: Normocephalic and atraumatic.  Right Ear: External ear normal.  Left Ear: External ear normal.  Nose: Nose normal.  Mouth/Throat: Oropharynx is clear and moist. No oropharyngeal exudate.  Eyes: Conjunctivae and EOM are normal. Pupils are equal, round, and reactive to light. Right eye exhibits no discharge. Left eye exhibits no discharge. No scleral icterus.  Neck: Normal range of motion. Neck supple. No thyromegaly present.  Cardiovascular: Normal rate, regular rhythm and normal heart sounds.   No murmur heard. The rhythm was regular at 60/m  Pulmonary/Chest: Effort normal and breath  sounds normal. No respiratory distress. She has no wheezes. She has no rales. She exhibits no tenderness.  Clear anteriorly and posteriorly  Abdominal: Soft. Bowel sounds are normal. She exhibits no mass. There is no tenderness. There is no rebound and no guarding.  No abdominal tenderness liver or spleen enlargement or suprapubic tenderness.  Musculoskeletal: She exhibits no edema or tenderness.  The patient uses a cane for ambulation  Lymphadenopathy:    She has no cervical adenopathy.  Neurological: She is alert and oriented to person, place, and time.  Skin: Skin is warm and dry. No rash noted.  Psychiatric: She has a normal mood and affect. Her behavior is normal. Judgment and thought content normal.  Mood and affect are positive.  Nursing note and vitals reviewed.  BP 154/68 mmHg  Pulse 58  Temp(Src) 97.5 F (36.4 C) (Oral)  Ht 5' (1.524 m)  Wt 122 lb 12.8 oz (55.702 kg)  BMI 23.98 kg/m2        Assessment & Plan:  1. Type 2 diabetes mellitus with retinopathy, with long-term current use of insulin, macular edema presence unspecified, unspecified laterality, unspecified retinopathy severity ([place]) -Continue to monitor blood sugars closely and bring readings with you to the next pro time visit++++ - Bayer DCA Hb A1c Waived - CBC with Differential/Platelet  2. Essential hypertension -The blood pressure slightly up on the systolic side today and he'll be no change in treatment - CBC with Differential/Platelet  3. Hyperlipidemia -Continue current aggressive therapeutic lifestyle changes pending results of lab work - BMP8+EGFR - Hepatic function panel - Lipid panel  4. Vitamin D deficiency -Continue vitamin D replacement pending results of lab work - VITAMIN D 25 Hydroxy (Vit-D Deficiency, Fractures) - CBC with Differential/Platelet  5. Primary osteoarthritis involving multiple joints -Continue with exercise and Tylenol  6. Chronic congestive heart failure, unspecified  congestive heart failure type ([place]) -Continue follow-up with cardiology  7. Pulmonary emphysema, unspecified emphysema type ([place]) -Continue follow-up with pulmonology  8. Type 2 diabetes mellitus with stage 3 chronic kidney disease, without long-term current use of insulin (H[facility]) -Monitor blood sugars closely  9. Paroxysmal atrial fibrillation ([facility]) -Continue follow-up with cardiology. Patient had a regular rate and rhythm today in the office at 60/m - CoaguChek XS/INR Waived  10. Hyperlipidemia LDL goal <70 -The patient is statin intolerant and will continue with aggressive therapeutic lifestyle changes  11. Atherosclerotic peripheral vascular disease ([place]) -Aggressive therapeutic lifestyle changes  12. Sore throat -Rapid strep was negative and she will use nasal saline - Rapid strep screen (not at [facility])  13. Dysuria -A urinalysis will be done again  today because of her frequency. - Urinalysis, Complete - Urine culture   Patient Instructions   Anticoagulation Dose Instructions as of 11/04/2015      Dorene Grebe Tue Wed Thu Fri Sat   New Dose 5 mg 2.5 mg 5 mg 5 mg 5 mg 5 mg 5 mg    Description        No warfarin tomorrow - Thursdays, May 11th.  Then decrease dose to 1/2 tablet Mondays and 1 tablet all other days (dose changes called to [facility])      INR was 3.3 today  Continue current medications. Continue good therapeutic lifestyle changes which include good diet and exercise. Fall precautions discussed with patient. If an FOBT was given today- please return it to our front desk. If you are over 18 years old - you may need Prevnar 38 or the adult Pneumonia vaccine.  Flu Shots will be available at our office starting mid- September. Please call and schedule a FLU CLINIC APPOINTMENT.                        Medicare Annual Wellness Visit  [facility] and the medical providers at [facility] strive to bring you the best medical care.  In  doing so we not only want to address your current medical conditions and concerns but also to detect new conditions early and prevent illness, disease and health-related problems.    Medicare offers a yearly Wellness Visit which allows our clinical staff to assess your need for preventative services including immunizations, lifestyle education, counseling to decrease risk of preventable diseases and screening for fall risk and other medical concerns.    This visit is provided free of charge (no copay) for all Medicare recipients. The clinical pharmacists at [facility] have begun to conduct these Wellness Visits which will also include a thorough review of all your medications.    As you primary medical provider recommend that you make an appointment for your Annual Wellness Visit if you have not done so already this year.  You may set up this appointment before you leave today or you may call back [phone]) and schedule an appointment.  Please make sure when you call that you mention that you are scheduling your Annual Wellness Visit with the clinical pharmacist so that the appointment may be made for the proper length of time.    Monitor blood sugars closely especially the next few days since there was a low reading this morning Adjust Coumadin to the pharmacy and this is already been taking care of with your packaged medicines ProTime rechecked will be arranged with daughter-in-law over the next few days Stay as active as possible and just take Tylenol as needed for pain Avoid tranquilizers and muscle relaxants as much as possible   Arrie Senate MD

## 2015-11-04 NOTE — Patient Instructions (Addendum)
Anticoagulation Dose Instructions as of 11/04/2015      Dorene Grebe Tue Wed Thu Fri Sat   New Dose 5 mg 2.5 mg 5 mg 5 mg 5 mg 5 mg 5 mg    Description        No warfarin tomorrow - Thursdays, May 11th.  Then decrease dose to 1/2 tablet Mondays and 1 tablet all other days (dose changes called to [facility])      INR was 3.3 today  Continue current medications. Continue good therapeutic lifestyle changes which include good diet and exercise. Fall precautions discussed with patient. If an FOBT was given today- please return it to our front desk. If you are over 52 years old - you may need Prevnar 20 or the adult Pneumonia vaccine.  Flu Shots will be available at our office starting mid- September. Please call and schedule a FLU CLINIC APPOINTMENT.                        Medicare Annual Wellness Visit  [facility] and the medical providers at [facility] strive to bring you the best medical care.  In doing so we not only want to address your current medical conditions and concerns but also to detect new conditions early and prevent illness, disease and health-related problems.    Medicare offers a yearly Wellness Visit which allows our clinical staff to assess your need for preventative services including immunizations, lifestyle education, counseling to decrease risk of preventable diseases and screening for fall risk and other medical concerns.    This visit is provided free of charge (no copay) for all Medicare recipients. The clinical pharmacists at [facility] have begun to conduct these Wellness Visits which will also include a thorough review of all your medications.    As you primary medical provider recommend that you make an appointment for your Annual Wellness Visit if you have not done so already this year.  You may set up this appointment before you leave today or you may call back [phone] and schedule an appointment.  Please  make sure when you call that you mention that you are scheduling your Annual Wellness Visit with the clinical pharmacist so that the appointment may be made for the proper length of time.    Monitor blood sugars closely especially the next few days since there was a low reading this morning Adjust Coumadin to the pharmacy and this is already been taking care of with your packaged medicines ProTime rechecked will be arranged with daughter-in-law over the next few days Stay as active as possible and just take Tylenol as needed for pain Avoid tranquilizers and muscle relaxants as much as possible

## 2015-11-05 LAB — URINE CULTURE

## 2015-11-05 LAB — CBC WITH DIFFERENTIAL/PLATELET
BASOS ABS: 0.1 10*3/uL (ref 0.0–0.2)
Basos: 2 %
EOS (ABSOLUTE): 0.1 10*3/uL (ref 0.0–0.4)
Eos: 2 %
Hematocrit: 37.5 % (ref 34.0–46.6)
Hemoglobin: 12.2 g/dL (ref 11.1–15.9)
Immature Grans (Abs): 0 10*3/uL (ref 0.0–0.1)
Immature Granulocytes: 0 %
LYMPHS ABS: 1.2 10*3/uL (ref 0.7–3.1)
LYMPHS: 23 %
MCH: 30.8 pg (ref 26.6–33.0)
MCHC: 32.5 g/dL (ref 31.5–35.7)
MCV: 95 fL (ref 79–97)
Monocytes Absolute: 0.4 10*3/uL (ref 0.1–0.9)
Monocytes: 8 %
NEUTROS ABS: 3.3 10*3/uL (ref 1.4–7.0)
Neutrophils: 65 %
Platelets: 225 10*3/uL (ref 150–379)
RBC: 3.96 x10E6/uL (ref 3.77–5.28)
RDW: 13.1 % (ref 12.3–15.4)
WBC: 5.1 10*3/uL (ref 3.4–10.8)

## 2015-11-05 LAB — BMP8+EGFR
BUN/Creatinine Ratio: 19 (ref 12–28)
BUN: 26 mg/dL (ref 8–27)
CHLORIDE: 102 mmol/L (ref 96–106)
CO2: 25 mmol/L (ref 18–29)
Calcium: 9.8 mg/dL (ref 8.7–10.3)
Creatinine, Ser: 1.38 mg/dL — ABNORMAL HIGH (ref 0.57–1.00)
GFR calc non Af Amer: 35 mL/min/{1.73_m2} — ABNORMAL LOW (ref >59)
GFR, EST AFRICAN AMERICAN: 40 mL/min/{1.73_m2} — AB (ref >59)
GLUCOSE: 155 mg/dL — AB (ref 65–99)
Potassium: 4.2 mmol/L (ref 3.5–5.2)
Sodium: 144 mmol/L (ref 134–144)

## 2015-11-05 LAB — VITAMIN D 25 HYDROXY (VIT D DEFICIENCY, FRACTURES): VIT D 25 HYDROXY: 32.5 ng/mL (ref 30.0–100.0)

## 2015-11-05 LAB — HEPATIC FUNCTION PANEL
ALK PHOS: 97 IU/L (ref 39–117)
ALT: 20 IU/L (ref 0–32)
AST: 19 IU/L (ref 0–40)
Albumin: 4.2 g/dL (ref 3.5–4.7)
BILIRUBIN TOTAL: 0.5 mg/dL (ref 0.0–1.2)
BILIRUBIN, DIRECT: 0.14 mg/dL (ref 0.00–0.40)
Total Protein: 6.4 g/dL (ref 6.0–8.5)

## 2015-11-05 LAB — LIPID PANEL
Chol/HDL Ratio: 2 ratio units (ref 0.0–4.4)
Cholesterol, Total: 180 mg/dL (ref 100–199)
HDL: 92 mg/dL (ref >39)
LDL CALC: 71 mg/dL (ref 0–99)
Triglycerides: 86 mg/dL (ref 0–149)
VLDL CHOLESTEROL CAL: 17 mg/dL (ref 5–40)

## 2015-11-06 ENCOUNTER — Telehealth: Payer: Self-pay | Admitting: Family Medicine

## 2015-11-06 DIAGNOSIS — I509 Heart failure, unspecified: Secondary | ICD-10-CM | POA: Diagnosis not present

## 2015-11-06 DIAGNOSIS — M62838 Other muscle spasm: Secondary | ICD-10-CM | POA: Diagnosis not present

## 2015-11-06 NOTE — Telephone Encounter (Signed)
Reviewed test results with pt

## 2015-11-13 ENCOUNTER — Other Ambulatory Visit: Payer: Self-pay | Admitting: Cardiovascular Disease

## 2015-11-13 NOTE — Telephone Encounter (Signed)
Rx(s) sent to pharmacy electronically.  

## 2015-11-18 ENCOUNTER — Encounter (INDEPENDENT_AMBULATORY_CARE_PROVIDER_SITE_OTHER): Payer: Self-pay

## 2015-11-19 ENCOUNTER — Ambulatory Visit (INDEPENDENT_AMBULATORY_CARE_PROVIDER_SITE_OTHER): Payer: Medicare Other | Admitting: Pharmacist

## 2015-11-19 DIAGNOSIS — I480 Paroxysmal atrial fibrillation: Secondary | ICD-10-CM

## 2015-11-19 LAB — COAGUCHEK XS/INR WAIVED
INR: 1.7 — AB (ref 0.9–1.1)
PROTHROMBIN TIME: 20.2 s

## 2015-11-19 NOTE — Patient Instructions (Signed)
Anticoagulation Dose Instructions as of 11/19/2015      [name] Tue Wed Thu Fri Sat   New Dose 5 mg 5 mg 5 mg 5 mg 5 mg 5 mg 5 mg    Description        Increase to warfarin 5mg  - take 1 tablet daily      INR was 1.7 today

## 2015-11-27 ENCOUNTER — Other Ambulatory Visit: Payer: Self-pay | Admitting: Family Medicine

## 2015-11-28 ENCOUNTER — Other Ambulatory Visit: Payer: Self-pay | Admitting: Family Medicine

## 2015-12-02 ENCOUNTER — Encounter: Payer: Self-pay | Admitting: Pharmacist

## 2015-12-07 ENCOUNTER — Other Ambulatory Visit: Payer: Self-pay | Admitting: Family Medicine

## 2015-12-07 DIAGNOSIS — I509 Heart failure, unspecified: Secondary | ICD-10-CM | POA: Diagnosis not present

## 2015-12-07 DIAGNOSIS — M62838 Other muscle spasm: Secondary | ICD-10-CM | POA: Diagnosis not present

## 2015-12-09 ENCOUNTER — Encounter: Payer: Self-pay | Admitting: Pulmonary Disease

## 2015-12-09 ENCOUNTER — Ambulatory Visit (INDEPENDENT_AMBULATORY_CARE_PROVIDER_SITE_OTHER): Payer: Medicare Other | Admitting: Pulmonary Disease

## 2015-12-09 VITALS — BP 110/66 | HR 65 | Ht 61.0 in | Wt 123.0 lb

## 2015-12-09 DIAGNOSIS — J432 Centrilobular emphysema: Secondary | ICD-10-CM | POA: Diagnosis not present

## 2015-12-09 DIAGNOSIS — J9611 Chronic respiratory failure with hypoxia: Secondary | ICD-10-CM

## 2015-12-09 DIAGNOSIS — R05 Cough: Secondary | ICD-10-CM | POA: Diagnosis not present

## 2015-12-09 DIAGNOSIS — R058 Other specified cough: Secondary | ICD-10-CM

## 2015-12-09 NOTE — Patient Instructions (Signed)
Follow up in 6 months 

## 2015-12-09 NOTE — Progress Notes (Signed)
Current Outpatient Prescriptions on File Prior to Visit  Medication Sig  . ACCU-CHEK AVIVA PLUS test strip Test tid. Dx E11.9  . ALPRAZolam (XANAX) 0.25 MG tablet TAKE 1/2 TO 1 TABLET 2 TIMES A DAY AS NEEDED FOR ANXIETY  . amiodarone (PACERONE) 200 MG tablet Take 0.5 tablets (100 mg total) by mouth daily. PLEASE CONTACT OFFICE FOR ADDITIONAL REFILLS  . atorvastatin (LIPITOR) 20 MG tablet TAKE 1 TABLET DAILY IN THE EVENING  . Azelastine HCl 0.15 % SOLN Place 1 spray into both nostrils 2 (two) times daily as needed (congestion).  . baclofen (LIORESAL) 10 MG tablet Take 0.5 tablets (5 mg total) by mouth every 8 (eight) hours as needed for muscle spasms.  . budesonide-formoterol (SYMBICORT) 160-4.5 MCG/ACT inhaler Inhale 1 puff into the lungs 2 (two) times daily.  . Cholecalciferol (VITAMIN D PO) Take 1 capsule by mouth daily.   Marland Kitchen docusate sodium (COLACE) 100 MG capsule Take 100 mg by mouth 2 (two) times daily.  . feeding supplement, GLUCERNA SHAKE, (GLUCERNA SHAKE) LIQD Take 237 mLs by mouth 2 (two) times daily between meals.  . ferrous sulfate 325 (65 FE) MG tablet Take 1 tablet (325 mg total) by mouth daily with breakfast.  . fluticasone (FLONASE) 50 MCG/ACT nasal spray Place 1 spray into both nostrils 2 (two) times daily.  . furosemide (LASIX) 40 MG tablet Take 1 tablet (40 mg total) by mouth 2 (two) times daily.  Marland Kitchen glucosamine-chondroitin 500-400 MG tablet Take 1 tablet by mouth 2 (two) times daily.  Marland Kitchen HUMALOG KWIKPEN 100 UNIT/ML KiwkPen INJECT UP TO 8 UNITS BEFORE MEALS AS DIRECTED BY SLIDING SCALE. MAX 24 UNITS PER DAY  . hydrocortisone (ANUSOL-HC) 25 MG suppository Place 1 suppository (25 mg total) rectally 2 (two) times daily as needed for hemorrhoids or itching.  . Insulin Detemir (LEVEMIR FLEXTOUCH) 100 UNIT/ML Pen INJECT 7 UNITS SQ EVERY MORNING  . Insulin Pen Needle (PEN NEEDLES) 31G X 6 MM MISC Use to administer insulin up to qid  . Ipratropium-Albuterol (COMBIVENT) 20-100 MCG/ACT AERS  respimat Inhale 1 puff into the lungs every 6 (six) hours as needed for wheezing or shortness of breath.  . metoprolol succinate (TOPROL-XL) 25 MG 24 hr tablet TAKE (1/2) TABLET DAILY.  . Multiple Vitamins-Minerals (PRESERVISION/LUTEIN) CAPS Take 1 capsule by mouth daily.   Marland Kitchen NIFEDICAL XL 30 MG 24 hr tablet Take 1 tablet (30 mg total) by mouth daily.  . nitroGLYCERIN (NITROSTAT) 0.4 MG SL tablet Place 1 tablet (0.4 mg total) under the tongue every 5 (five) minutes as needed for chest pain.  . pantoprazole (PROTONIX) 40 MG tablet Take 1 tablet (40 mg total) by mouth daily.  . polyethylene glycol powder (GLYCOLAX/MIRALAX) powder Take 17 g by mouth daily as needed.  . potassium chloride SA (K-DUR,KLOR-CON) 20 MEQ tablet TAKE 1 TABLET DAILY  . PRODIGY TWIST TOP LANCETS 28G MISC CHECK BLOOD SUGAR UP TO 4 TIMES A DAY  . simethicone (MYLICON) 80 MG chewable tablet Chew 80 mg by mouth at bedtime as needed for flatulence.  . traMADol (ULTRAM) 50 MG tablet Take 1 tablet (50 mg total) by mouth every 12 (twelve) hours as needed. (Patient taking differently: Take 25 mg by mouth at bedtime. )  . vitamin C (ASCORBIC ACID) 500 MG tablet Take 500 mg by mouth at bedtime.   Marland Kitchen warfarin (COUMADIN) 5 MG tablet TAKE (1) TABLET DAILY AFTER SUPPER.   No current facility-administered medications on file prior to visit.    Chief Complaint  Patient presents with  . Follow-up    Pt states that she feels like she has a cold, scratchy throat, mucus production x 1-2 months. Denies SOB, wheezing, CP or tightness. Using O2 as directed. Using meds as directed.    Tests PFT 06/08/10>>FEV1 0.93(62%), FEV1% 60, TLC 2.84(69%), DLCO 54%, +BD Echo 08/23/11>>mild LVH, EF 55 to 60%, mild AS, mild MR, mod/severe LA dilation ONO with 2.5 liters 05/07/12>>Test time 3 hrs 47 min. Mean SpO2 99%, low SpO2 96%. Echo 01/20/15 >> EF 45 to 50%, PAS 55 mmHg  Past medical history HTN. HLD, CAD, diastolic CHF, RBBB, PAF, DM, Anxiety,  GERD  Past surgical history, Family history, Social history, Allergies reviewed.  Vital signs BP 110/66 mmHg  Pulse 65  Ht 5\' 1"  (1.549 m)  Wt 123 lb (55.792 kg)  BMI 23.25 kg/m2  SpO2 97%   History of Present Illness: [name] is a 82 y.o. female former smoker with GOLD 4 COPD, and chronic respiratory failure with hypoxia.  She was in hospital for several months after hip fracture.  She is slowly improving.  She is not having much trouble with her breathing >> not much cough or sputum.  She does still have sinus drainage and post nasal drip.  She uses her oxygen at night and when she does activity such as vacuuming.  She doesn't need to use combivent much.  She has a small sore on her right leg.   Physical Exam:  General - Pleasant HEENT - no sinus tenderness, no oral exudate, no LAN Cardiac - s1s2 Chest - no wheeze/rales Abdomen - soft, nontender Extremities - no edema Skin - small ulcer Rt mid calf anteriorly, no warmth/drainage/fluctuation Neurologic - normal strength Psychiatric - normal mood, behavior   Assessment/Plan:  COPD with emphysema. - she can continue symbicort with prn combivent  Chronic hypoxic respiratory failure. - continue 2.5 liters oxygen with sleep and exertion  Upper airway cough syndrome. - she can continue flonase, azelastine  Rt leg ulcer. - appears to be healing - advised her to avoid overusing cortisone cream - if persists, then f/u with PCP   Patient Instructions  Follow up in 6 months     Chesley Mires, MD [place] 12/09/2015, 3:56 PM Pager:  [phone]

## 2015-12-15 ENCOUNTER — Encounter: Payer: Self-pay | Admitting: Pharmacist

## 2015-12-16 ENCOUNTER — Telehealth: Payer: Self-pay | Admitting: Pulmonary Disease

## 2015-12-16 NOTE — Telephone Encounter (Signed)
Received paperwork from [facility] regarding the patient's O2 and requalification.  Pt only needs ONO on Room Air to qualify for O2 per VS.  Pt was not walked during last OV so we have no qualifying saturations on file.  Pt patient predominantly uses O2 at night at 2.5 liters.  LM for [name]/[facility] [phone]) to discuss this

## 2015-12-18 ENCOUNTER — Other Ambulatory Visit: Payer: Self-pay | Admitting: Cardiovascular Disease

## 2015-12-22 ENCOUNTER — Ambulatory Visit (INDEPENDENT_AMBULATORY_CARE_PROVIDER_SITE_OTHER): Payer: Medicare Other | Admitting: Pharmacist

## 2015-12-22 DIAGNOSIS — N183 Chronic kidney disease, stage 3 unspecified: Secondary | ICD-10-CM

## 2015-12-22 DIAGNOSIS — Z794 Long term (current) use of insulin: Secondary | ICD-10-CM | POA: Diagnosis not present

## 2015-12-22 DIAGNOSIS — E1122 Type 2 diabetes mellitus with diabetic chronic kidney disease: Secondary | ICD-10-CM | POA: Diagnosis not present

## 2015-12-22 DIAGNOSIS — I480 Paroxysmal atrial fibrillation: Secondary | ICD-10-CM

## 2015-12-22 DIAGNOSIS — R300 Dysuria: Secondary | ICD-10-CM

## 2015-12-22 LAB — COAGUCHEK XS/INR WAIVED
INR: 2.7 — ABNORMAL HIGH (ref 0.9–1.1)
PROTHROMBIN TIME: 32.2 s

## 2015-12-22 NOTE — Patient Instructions (Signed)
Anticoagulation Dose Instructions as of 12/22/2015      Dorene Grebe Tue Wed Thu Fri Sat   New Dose 5 mg 5 mg 5 mg 5 mg 5 mg 5 mg 5 mg    Description        Continue warfarin 5mg  - take 1 tablet once daily      INR was 2.7 today

## 2015-12-24 NOTE — Telephone Encounter (Signed)
Spoke with [name] at [facility], states that medicare is needing qualifying sats since the patient has portable O2 they are paying for. At last OV it was discussed that hopefully the insurance would accept the order and cover the O2 as written for Nocturnal and PRN daytime. Patient states that she will come in on Monday July 3rd at 11am for a qualifying walk test.   [name] is faxing the form for Dr Halford Chessman to sign upon his return - aware that he is out for 2 weeks. States that as long as I have the sats in the computer that will be fine until VS can sign the form.   Will hold in my box to follow up

## 2015-12-25 ENCOUNTER — Telehealth: Payer: Self-pay | Admitting: *Deleted

## 2015-12-25 NOTE — Telephone Encounter (Signed)
Pt needs letter for phone company stating she is legally blind Letter ok to write per [facility] Letter written to front for pt pick up Pt's daughter in law notified

## 2015-12-26 ENCOUNTER — Emergency Department (HOSPITAL_BASED_OUTPATIENT_CLINIC_OR_DEPARTMENT_OTHER): Payer: Medicare Other

## 2015-12-26 ENCOUNTER — Emergency Department (HOSPITAL_BASED_OUTPATIENT_CLINIC_OR_DEPARTMENT_OTHER)
Admission: EM | Admit: 2015-12-26 | Discharge: 2015-12-26 | Disposition: A | Discharge status: Home / Self Care | Payer: Medicare Other | Attending: Emergency Medicine | Admitting: Emergency Medicine

## 2015-12-26 ENCOUNTER — Encounter (HOSPITAL_BASED_OUTPATIENT_CLINIC_OR_DEPARTMENT_OTHER): Payer: Self-pay

## 2015-12-26 DIAGNOSIS — I509 Heart failure, unspecified: Secondary | ICD-10-CM | POA: Diagnosis not present

## 2015-12-26 DIAGNOSIS — S2232XA Fracture of one rib, left side, initial encounter for closed fracture: Secondary | ICD-10-CM | POA: Diagnosis not present

## 2015-12-26 DIAGNOSIS — E119 Type 2 diabetes mellitus without complications: Secondary | ICD-10-CM | POA: Insufficient documentation

## 2015-12-26 DIAGNOSIS — S299XXA Unspecified injury of thorax, initial encounter: Secondary | ICD-10-CM | POA: Diagnosis not present

## 2015-12-26 DIAGNOSIS — W228XXA Striking against or struck by other objects, initial encounter: Secondary | ICD-10-CM | POA: Diagnosis not present

## 2015-12-26 DIAGNOSIS — Z794 Long term (current) use of insulin: Secondary | ICD-10-CM | POA: Insufficient documentation

## 2015-12-26 DIAGNOSIS — S3991XA Unspecified injury of abdomen, initial encounter: Secondary | ICD-10-CM | POA: Diagnosis not present

## 2015-12-26 DIAGNOSIS — Z79899 Other long term (current) drug therapy: Secondary | ICD-10-CM | POA: Insufficient documentation

## 2015-12-26 DIAGNOSIS — Z87891 Personal history of nicotine dependence: Secondary | ICD-10-CM | POA: Insufficient documentation

## 2015-12-26 DIAGNOSIS — Y939 Activity, unspecified: Secondary | ICD-10-CM | POA: Insufficient documentation

## 2015-12-26 DIAGNOSIS — Y929 Unspecified place or not applicable: Secondary | ICD-10-CM | POA: Diagnosis not present

## 2015-12-26 DIAGNOSIS — Z7984 Long term (current) use of oral hypoglycemic drugs: Secondary | ICD-10-CM | POA: Diagnosis not present

## 2015-12-26 DIAGNOSIS — I252 Old myocardial infarction: Secondary | ICD-10-CM | POA: Insufficient documentation

## 2015-12-26 DIAGNOSIS — I110 Hypertensive heart disease with heart failure: Secondary | ICD-10-CM | POA: Insufficient documentation

## 2015-12-26 DIAGNOSIS — R0781 Pleurodynia: Secondary | ICD-10-CM | POA: Diagnosis not present

## 2015-12-26 DIAGNOSIS — Y999 Unspecified external cause status: Secondary | ICD-10-CM | POA: Diagnosis not present

## 2015-12-26 DIAGNOSIS — I2510 Atherosclerotic heart disease of native coronary artery without angina pectoris: Secondary | ICD-10-CM | POA: Insufficient documentation

## 2015-12-26 DIAGNOSIS — I480 Paroxysmal atrial fibrillation: Secondary | ICD-10-CM | POA: Diagnosis not present

## 2015-12-26 DIAGNOSIS — R109 Unspecified abdominal pain: Secondary | ICD-10-CM | POA: Diagnosis not present

## 2015-12-26 DIAGNOSIS — J449 Chronic obstructive pulmonary disease, unspecified: Secondary | ICD-10-CM | POA: Diagnosis not present

## 2015-12-26 DIAGNOSIS — E785 Hyperlipidemia, unspecified: Secondary | ICD-10-CM | POA: Insufficient documentation

## 2015-12-26 LAB — COMPREHENSIVE METABOLIC PANEL
ALK PHOS: 92 U/L (ref 38–126)
ALT: 22 U/L (ref 14–54)
AST: 20 U/L (ref 15–41)
Albumin: 4.2 g/dL (ref 3.5–5.0)
Anion gap: 10 (ref 5–15)
BUN: 41 mg/dL — AB (ref 6–20)
CALCIUM: 9.5 mg/dL (ref 8.9–10.3)
CHLORIDE: 100 mmol/L — AB (ref 101–111)
CO2: 31 mmol/L (ref 22–32)
CREATININE: 1.45 mg/dL — AB (ref 0.44–1.00)
GFR calc Af Amer: 37 mL/min — ABNORMAL LOW (ref >60)
GFR calc non Af Amer: 32 mL/min — ABNORMAL LOW (ref >60)
Glucose, Bld: 126 mg/dL — ABNORMAL HIGH (ref 65–99)
Potassium: 3.9 mmol/L (ref 3.5–5.1)
SODIUM: 141 mmol/L (ref 135–145)
Total Bilirubin: 0.6 mg/dL (ref 0.3–1.2)
Total Protein: 7.7 g/dL (ref 6.5–8.1)

## 2015-12-26 LAB — CBC WITH DIFFERENTIAL/PLATELET
Basophils Absolute: 0.1 10*3/uL (ref 0.0–0.1)
Basophils Relative: 1 %
Eosinophils Absolute: 0.1 10*3/uL (ref 0.0–0.7)
Eosinophils Relative: 1 %
HEMATOCRIT: 41.5 % (ref 36.0–46.0)
HEMOGLOBIN: 13.2 g/dL (ref 12.0–15.0)
LYMPHS ABS: 1.3 10*3/uL (ref 0.7–4.0)
Lymphocytes Relative: 17 %
MCH: 31.2 pg (ref 26.0–34.0)
MCHC: 31.8 g/dL (ref 30.0–36.0)
MCV: 98.1 fL (ref 78.0–100.0)
MONOS PCT: 8 %
Monocytes Absolute: 0.6 10*3/uL (ref 0.1–1.0)
NEUTROS ABS: 5.4 10*3/uL (ref 1.7–7.7)
NEUTROS PCT: 73 %
Platelets: 223 10*3/uL (ref 150–400)
RBC: 4.23 MIL/uL (ref 3.87–5.11)
RDW: 13 % (ref 11.5–15.5)
WBC: 7.4 10*3/uL (ref 4.0–10.5)

## 2015-12-26 LAB — PROTIME-INR
INR: 2.09 — ABNORMAL HIGH (ref 0.00–1.49)
Prothrombin Time: 23.3 seconds — ABNORMAL HIGH (ref 11.6–15.2)

## 2015-12-26 MED ORDER — FENTANYL CITRATE (PF) 100 MCG/2ML IJ SOLN
12.5000 ug | Freq: Once | INTRAMUSCULAR | Status: AC
  Administered 2015-12-26: 12.5 ug via INTRAVENOUS
  Filled 2015-12-26: qty 2

## 2015-12-26 MED ORDER — ONDANSETRON HCL 4 MG/2ML IJ SOLN
4.0000 mg | Freq: Once | INTRAMUSCULAR | Status: AC
  Administered 2015-12-26: 4 mg via INTRAVENOUS
  Filled 2015-12-26: qty 2

## 2015-12-26 MED ORDER — TRAMADOL HCL 50 MG PO TABS: 50.0000 mg | ORAL_TABLET | Freq: Four times a day (QID) | ORAL | Status: AC | PRN

## 2015-12-26 NOTE — ED Notes (Signed)
Pt assisted to bathroom with steady.

## 2015-12-26 NOTE — ED Provider Notes (Signed)
CSN: [id number]     Arrival date & time 12/26/15  2016 History  By signing my name below, I, Evelene Croon, attest that this documentation has been prepared under the direction and in the presence of Julianne Rice, MD . Electronically Signed: Evelene Croon, Scribe. 12/26/2015. 9:11 PM.    Chief Complaint  Patient presents with  . left rib pain     The history is provided by the patient. No language interpreter was used.     HPI Comments:  [name] is a 82 y.o. female with a history of AFIB, CAD and CHF, who presents to the Emergency Department complaining of moderate, constant, left sided rib pain s/p injury ~ 2-3 hours PTA. Pt states a table she was resting on collapsed and she struck the left side of her ribs on the metal bar of her walker. She denies head injury, LOC, SOB, nausea, and vomiting. Pt has no other acute complaints or symptoms at this time.  No alleviating factors noted.  Pt is currently on coumadin for afib and wears home O2 at night.    Past Medical History  Diagnosis Date  . Hyperlipidemia   . Hypertension   . Anxiety   . GERD (gastroesophageal reflux disease)   . CAD (coronary artery disease)     Cath in 2006 showed an occluded RCA with left-to-right collaterals & otherwise noncritical CAD with EF=25-30% at that time. EF subsequently improved to normal by 2D ECHO. Cath Aug. 2011 showed unchanged anatomy. > medical therapy recommended  . Diastolic dysfunction     Grade II  . RBBB (right bundle branch block)     Chronic  . Hypoxemic respiratory failure, chronic (HCC)     uses 2.5 liters of oxygen with sleep  . COPD (chronic obstructive pulmonary disease) (HCC)     GOLD 4 - PFT 06/08/10 FEV1 0.93 (62%), FEV1% 60, TLC 2.84 (69%0, DLCO 54%, +BD) On home O2.  . Secondary pulmonary hypertension ([place])   . Edema   . Angina   . Shortness of breath   . Bradycardia 08/24/2011  . Carotid artery disease (HCC)     Moderate, right greater than left which we following by  duplex ultrasound. [place] 12/05/11 = RIght Bulb/Prox ICA: Moderate to severe amt of fibrous plaque elevating velocities w/in prox segment of ICA. Consistent w/a 50-69% diameter reduction. Left Bulb/Prox ICA: Moderate amt of fibrous plaque slightly elevating velocities w/in the prox segment of the ICA. Consistent w/a 0-49% diameter reduction.   Marland Kitchen PAF (paroxysmal atrial fibrillation) (HCC)     In the past, on coumadin  . History of stress test 07/02/09    Mild perfusion defect seen in the Basal Inferior region(s). This is consistent with an infarct/scar.  Post-stress EF=56%. Global LV systolic function is normal.  EKG is negative for ischemia.  No significant iscemia detected. Low risk scan.  . Lower extremity edema     ECHO 07/21/12 = EF 55-60%, performed for TIA. Responding to diurectics. Continue diuresis w/ a goal dry weight of <160lb. Venous Duplex 07/27/11 = Right lower extremity: no evidence of thrombus or thrombophlebitis. Essentially normal right lower extremity venous duplex Doppler evaluation.  . Carotid bruit   . Mitral valve regurgitation     Mild to moderate by ECHO 07/21/12  . Sleep-related hypoventilation   . COPD with emphysema ([place])   . Coronary atherosclerosis of unspecified type of vessel, native or graft   . PONV (postoperative nausea and vomiting)   . Family history  of adverse reaction to anesthesia     "daughter gets PONV"  . CHF (congestive heart failure) ([place])     Hospitalized 08/22/11-08/26/11 with CHF secondary to diastolic dysfunction & hospitilized in 07/2012 with a similar problem. TTE 08/23/11 = normal EF.  Marland Kitchen Myocardial infarction [facility])     "she's had several" (01/13/2015)  . On home oxygen therapy     "?L; prn" (01/13/2015)  . Pneumonia ~ 2013  . Type II diabetes mellitus (HCC)     goal A1C is 8, to avoid hypoglycemia  . Sleep apnea   . Stroke [facility])     "/CT scan; ~ 2014; didn't even know she'd had it"  (01/13/2015)  . Arthritis     "all over"  . Chronic lower back pain   .  Bleeds easily ([place])   . Fall during current hospitalization     "was at [facility]; fell; transferred to [facility]" (01/13/2015)   Past Surgical History  Procedure Laterality Date  . Shoulder open rotator cuff repair Right 07/2005  . Cardiac catheterization  2006 & 2011    Cath in 2006 showed an occluded RCA with left-to-right collaterals & otherwise noncritical CAD with EF=25-30% at that time. EF subsequently improved to normal by 2D ECHO. Cath Aug. 2011 showed unchanged anatomy. > medical therapy recommended.  . Coronary angioplasty    . Coronary angioplasty with stent placement      x2  . Carotid duplex  12/05/11    Moderate, right greater than left which we following by duplex ultrasound. [place] 12/05/11 = RIght Bulb/Prox ICA: Moderate to severe amt of fibrous plaque elevating velocities w/in prox segment of ICA. Consistent w/a 50-69% diameter reduction. Left Bulb/Prox ICA: Moderate amt of fibrous plaque slightly elevating velocities w/in the prox segment of the ICA. Consistent w/a 0-49% diameter reduction.   . Venous duplex  07/27/11    Venous Duplex 07/27/11 = Right lower extremity: no evidence of thrombus or thrombophlebitis. Essentially normal right lower extremity venous duplex Doppler evaluation.  . Transvaginal tape (tvt) removal    . Cataract extraction w/ intraocular lens  implant, bilateral Bilateral   . Tonsillectomy    . Total abdominal hysterectomy  ~ 1967  . Femur im nail Left 01/15/2015    Procedure: INTRAMEDULLARY (IM) NAIL FEMORAL LEFT ;  Surgeon: Netta Cedars, MD;  Location: [place];  Service: Orthopedics;  Laterality: Left;   Family History  Problem Relation Age of Onset  . Cancer Mother     Skin  . Heart disease Mother   . Emphysema Father   . Heart disease Father   . Congestive Heart Failure Father   . Heart disease Brother   . Heart disease Brother   . Early death Sister    Social History  Substance Use Topics  . Smoking status: Former Smoker -- 1.50 packs/day for 15  years    Types: Cigarettes  . Smokeless tobacco: Never Used     Comment: Smoked 1.5 packs per day for 15 years, quit in 1995.  Marland Kitchen Alcohol Use: 3.6 oz/week    6 Glasses of wine per week     Comment: 01/13/2015 "couple glasses of wine maybe 3 days/wk"   OB History    No data available     Review of Systems  Constitutional: Negative for fever and chills.  Respiratory: Negative for shortness of breath.   Cardiovascular: Positive for chest pain (rib pain).  Gastrointestinal: Positive for abdominal pain. Negative for nausea, vomiting, diarrhea and constipation.  Genitourinary: Negative for dysuria, hematuria and flank pain.  Musculoskeletal: Positive for myalgias and back pain. Negative for neck pain and neck stiffness.  Skin: Negative for rash and wound.  Neurological: Negative for dizziness, syncope, weakness, numbness and headaches.  All other systems reviewed and are negative.  Allergies  Atorvastatin; Codeine; Morphine; Other; Rosuvastatin; and Iohexol  Home Medications   Prior to Admission medications   Medication Sig Start Date End Date Taking? Authorizing Provider  ACCU-CHEK AVIVA PLUS test strip Test tid. Dx E11.9 11/27/15   Chipper Herb, MD  ALPRAZolam Duanne Moron) 0.25 MG tablet TAKE 1/2 TO 1 TABLET 2 TIMES A DAY AS NEEDED FOR ANXIETY 07/03/15   Sharion Balloon, FNP  amiodarone (PACERONE) 200 MG tablet TAKE (1/2) TABLET DAILY. 12/18/15   Lorretta Harp, MD  atorvastatin (LIPITOR) 20 MG tablet TAKE 1 TABLET DAILY IN THE EVENING 11/30/15   Chipper Herb, MD  Azelastine HCl 0.15 % SOLN Place 1 spray into both nostrils 2 (two) times daily as needed (congestion).    Historical Provider, MD  baclofen (LIORESAL) 10 MG tablet Take 0.5 tablets (5 mg total) by mouth every 8 (eight) hours as needed for muscle spasms. 03/26/15   Fransisca Kaufmann Dettinger, MD  budesonide-formoterol (SYMBICORT) 160-4.5 MCG/ACT inhaler Inhale 1 puff into the lungs 2 (two) times daily. 01/24/14   Chipper Herb, MD   Cholecalciferol (VITAMIN D PO) Take 1 capsule by mouth daily.     Historical Provider, MD  docusate sodium (COLACE) 100 MG capsule Take 100 mg by mouth 2 (two) times daily.    Historical Provider, MD  feeding supplement, GLUCERNA SHAKE, (GLUCERNA SHAKE) LIQD Take 237 mLs by mouth 2 (two) times daily between meals. 01/23/15   Eugenie Filler, MD  ferrous sulfate 325 (65 FE) MG tablet Take 1 tablet (325 mg total) by mouth daily with breakfast. 07/24/15   Tammy Eckard, PHARMD  fluticasone (FLONASE) 50 MCG/ACT nasal spray Place 1 spray into both nostrils 2 (two) times daily. 12/24/13   Chesley Mires, MD  furosemide (LASIX) 40 MG tablet Take 1 tablet (40 mg total) by mouth 2 (two) times daily. 11/05/15   Chipper Herb, MD  glucosamine-chondroitin 500-400 MG tablet Take 1 tablet by mouth 2 (two) times daily.    Historical Provider, MD  HUMALOG KWIKPEN 100 UNIT/ML KiwkPen INJECT UP TO 8 UNITS BEFORE MEALS AS DIRECTED BY SLIDING SCALE. MAX 24 UNITS PER DAY 04/07/15   Fransisca Kaufmann Dettinger, MD  hydrocortisone (ANUSOL-HC) 25 MG suppository Place 1 suppository (25 mg total) rectally 2 (two) times daily as needed for hemorrhoids or itching. 07/30/15   Fransisca Kaufmann Dettinger, MD  Insulin Detemir (LEVEMIR FLEXTOUCH) 100 UNIT/ML Pen INJECT 7 UNITS SQ EVERY MORNING 04/22/15   Tammy Eckard, PHARMD  Insulin Pen Needle (PEN NEEDLES) 31G X 6 MM MISC Use to administer insulin up to qid 05/25/15   Chipper Herb, MD  Ipratropium-Albuterol (COMBIVENT) 20-100 MCG/ACT AERS respimat Inhale 1 puff into the lungs every 6 (six) hours as needed for wheezing or shortness of breath. 01/24/14   Chipper Herb, MD  metoprolol succinate (TOPROL-XL) 25 MG 24 hr tablet TAKE (1/2) TABLET DAILY. 12/08/15   Chipper Herb, MD  Multiple Vitamins-Minerals (PRESERVISION/LUTEIN) CAPS Take 1 capsule by mouth daily.     Historical Provider, MD  NIFEDICAL XL 30 MG 24 hr tablet Take 1 tablet (30 mg total) by mouth daily. 05/18/15   Lorretta Harp, MD   nitroGLYCERIN (NITROSTAT) 0.4  MG SL tablet Place 1 tablet (0.4 mg total) under the tongue every 5 (five) minutes as needed for chest pain. 05/09/14   Mary-Margaret Hassell Done, FNP  pantoprazole (PROTONIX) 40 MG tablet Take 1 tablet (40 mg total) by mouth daily. 08/12/15   Chipper Herb, MD  polyethylene glycol powder (GLYCOLAX/MIRALAX) powder Take 17 g by mouth daily as needed. 07/30/15   Fransisca Kaufmann Dettinger, MD  potassium chloride SA (K-DUR,KLOR-CON) 20 MEQ tablet TAKE 1 TABLET DAILY 09/22/15   Fransisca Kaufmann Dettinger, MD  PRODIGY TWIST TOP LANCETS 28G MISC CHECK BLOOD SUGAR UP TO 4 TIMES A DAY 05/19/15   Chipper Herb, MD  simethicone (MYLICON) 80 MG chewable tablet Chew 80 mg by mouth at bedtime as needed for flatulence.    Historical Provider, MD  traMADol (ULTRAM) 50 MG tablet Take 1 tablet (50 mg total) by mouth every 12 (twelve) hours as needed. Patient taking differently: Take 25 mg by mouth at bedtime.  04/22/15   Sharion Balloon, FNP  traMADol (ULTRAM) 50 MG tablet Take 1 tablet (50 mg total) by mouth every 6 (six) hours as needed for severe pain. 12/26/15   Julianne Rice, MD  vitamin C (ASCORBIC ACID) 500 MG tablet Take 500 mg by mouth at bedtime.     Historical Provider, MD  warfarin (COUMADIN) 5 MG tablet TAKE (1) TABLET DAILY AFTER SUPPER. 11/05/15   Chipper Herb, MD   BP 147/100 mmHg  Pulse 74  Temp(Src) 97.9 F (36.6 C) (Oral)  Resp 18  Ht 5\' 1"  (1.549 m)  Wt 121 lb (54.885 kg)  BMI 22.87 kg/m2  SpO2 95% Physical Exam  Constitutional: She is oriented to person, place, and time. She appears well-developed and well-nourished. No distress.  HENT:  Head: Normocephalic and atraumatic.  Mouth/Throat: Oropharynx is clear and moist. No oropharyngeal exudate.  No evidence of any head injury.  Eyes: EOM are normal. Pupils are equal, round, and reactive to light.  Neck: Normal range of motion. Neck supple.  No posterior midline cervical tenderness to palpation.  Cardiovascular: Normal rate  and regular rhythm.  Exam reveals no gallop and no friction rub.   No murmur heard. Pulmonary/Chest: Effort normal and breath sounds normal. No respiratory distress. She has no wheezes. She has no rales. She exhibits tenderness (patient with a left anterior inferior rib tenderness. There is no crepitance or deformity.).  Abdominal: Soft. Bowel sounds are normal. She exhibits no distension and no mass. There is no tenderness. There is no rebound and no guarding.  Musculoskeletal: Normal range of motion. She exhibits tenderness. She exhibits no edema.  Patient has lumbar paraspinal tenderness to palpation left greater than right. No lower extremity shortening. Pelvis is stable. Distal pulses are equal and intact.  Neurological: She is alert and oriented to person, place, and time.  5/5 motor in all extremities. Sensation is fully intact.  Skin: Skin is warm and dry. No rash noted. No erythema.  Psychiatric: She has a normal mood and affect. Her behavior is normal.  Nursing note and vitals reviewed.   ED Course  Procedures  DIAGNOSTIC STUDIES:  Oxygen Saturation is 95% on RA, adequate by my interpretation.    COORDINATION OF CARE:  9:04 PM Discussed treatment plan with pt at bedside and pt agreed to plan.  Labs Review Labs Reviewed  COMPREHENSIVE METABOLIC PANEL - Abnormal; Notable for the following:    Chloride 100 (*)    Glucose, Bld 126 (*)    BUN 41 (*)  Creatinine, Ser 1.45 (*)    GFR calc non Af Amer 32 (*)    GFR calc Af Amer 37 (*)    All other components within normal limits  PROTIME-INR - Abnormal; Notable for the following:    Prothrombin Time 23.3 (*)    INR 2.09 (*)    All other components within normal limits  CBC WITH DIFFERENTIAL/PLATELET    Imaging Review Ct Abdomen Pelvis Wo Contrast  12/26/2015  CLINICAL DATA:  Left-sided rib pain after injury after fall on left side onto her walker EXAM: CT ABDOMEN AND PELVIS WITHOUT CONTRAST TECHNIQUE: Multidetector CT  imaging of the abdomen and pelvis was performed following the standard protocol without IV contrast. COMPARISON:  07/10/2011 FINDINGS: Lower chest:  No evidence of pleural effusion. Hepatobiliary: No focal abnormality in the liver on this study without intravenous contrast. No evidence of hepatomegaly. Layering sludge and potential tiny stones in the lumen of the gallbladder. No intrahepatic or extrahepatic biliary dilation. Pancreas: No focal mass lesion. No dilatation of the main duct. No intraparenchymal cyst. No peripancreatic edema. Spleen: No splenomegaly. No focal mass lesion. Adrenals/Urinary Tract: Thickening of the adrenal glands noted without a discrete nodule. Cortical atrophy noted in both kidneys with vascular calcification in the renal hila bilaterally. 2 mm nonobstructing stone identified lower pole right kidney. 2-3 mm stone identified lower pole left kidney with a 2 mm left renal stone near the junction of the interpolar region and lower pole. No ureteral or bladder stones. Stomach/Bowel: Small hiatal hernia noted. Duodenum is normally positioned as is the ligament of Treitz. No small bowel wall thickening. No small bowel dilatation. The terminal ileum is normal. The appendix is normal. Diverticular changes are noted in the left colon without evidence of diverticulitis. Vascular/Lymphatic: There is abdominal aortic atherosclerosis without aneurysm. There is no gastrohepatic or hepatoduodenal ligament lymphadenopathy. No intraperitoneal or retroperitoneal lymphadenopathy. No pelvic sidewall lymphadenopathy. Reproductive: Uterus surgically absent.  There is no adnexal mass. Other: No intraperitoneal free fluid. Musculoskeletal: Patient is status post left hip dynamic hip screw placement. Bones are diffusely demineralized. Diffuse degenerative changes noted in the lumbar spine. Although the lower left ribs have been incompletely visualized, there is an acute nondisplaced fracture of the anterior left  seventh rib. IMPRESSION: 1. Acute nondisplaced fracture of the anterior left seventh rib with incomplete visualization of the lower left ribs. 2. No acute findings in the abdomen or pelvis. 3. Nonobstructing bilateral renal stones. 4. Abdominal aortic atherosclerosis 5. Small hiatal hernia. Electronically Signed   By: Misty Stanley M.D.   On: 12/26/2015 22:39   Dg Chest 2 View  12/26/2015  CLINICAL DATA:  Left-sided rib pain after mild trauma. EXAM: CHEST  2 VIEW COMPARISON:  04/06/2015 FINDINGS: Lungs are adequately inflated without consolidation, effusion or pneumothorax. Mild stable cardiomegaly. Calcified plaque is present over the thoracic aorta. There are degenerative changes of the spine. IMPRESSION: No acute cardiopulmonary disease. Stable cardiomegaly. Aortic atherosclerosis. Electronically Signed   By: Marin Olp M.D.   On: 12/26/2015 21:08   I have personally reviewed and evaluated these images and lab results as part of my medical decision-making.   EKG Interpretation None      MDM   Final diagnoses:  Left rib fracture, closed, initial encounter    I personally performed the services described in this documentation, which was scribed in my presence. The recorded information has been reviewed and is accurate.   CT to rule out underlying abdominal injury. Anterior seventh rib fracture  visualized. No underlying injury. Vital signs remained stable. Stable hemoglobin. Patient's pain is improved after fentanyl. She tolerated Ultram in the past. Will discharge with Ultram and incentive spirometry. Advised to follow-up with her primary physician. Return precautions given.  Julianne Rice, MD 12/26/15 [phone]

## 2015-12-26 NOTE — ED Notes (Signed)
Patient complains of left sided rib pain after hitting side on metal bar with her walker. Bruising noted to left side, tender to touch, no shortness of breath

## 2015-12-26 NOTE — ED Notes (Signed)
Patient transported to CT 

## 2015-12-26 NOTE — ED Notes (Signed)
Pt reports pain is increasing. Dr. Lita Mains made aware.

## 2015-12-26 NOTE — ED Notes (Signed)
MD at bedside. 

## 2015-12-26 NOTE — Discharge Instructions (Signed)

## 2015-12-28 ENCOUNTER — Ambulatory Visit: Payer: Medicare Other

## 2015-12-30 ENCOUNTER — Other Ambulatory Visit: Payer: Medicare Other

## 2015-12-30 ENCOUNTER — Other Ambulatory Visit: Payer: Self-pay | Admitting: Family Medicine

## 2015-12-30 ENCOUNTER — Ambulatory Visit: Payer: Medicare Other

## 2015-12-30 DIAGNOSIS — N183 Chronic kidney disease, stage 3 unspecified: Secondary | ICD-10-CM

## 2015-12-30 DIAGNOSIS — R300 Dysuria: Secondary | ICD-10-CM | POA: Diagnosis not present

## 2015-12-30 DIAGNOSIS — E1122 Type 2 diabetes mellitus with diabetic chronic kidney disease: Secondary | ICD-10-CM

## 2015-12-30 DIAGNOSIS — Z794 Long term (current) use of insulin: Secondary | ICD-10-CM

## 2015-12-30 LAB — URINALYSIS, ROUTINE W REFLEX MICROSCOPIC
BILIRUBIN UA: NEGATIVE
GLUCOSE, UA: NEGATIVE
KETONES UA: NEGATIVE
LEUKOCYTES UA: NEGATIVE
Nitrite, UA: NEGATIVE
PROTEIN UA: NEGATIVE
RBC UA: NEGATIVE
SPEC GRAV UA: 1.01 (ref 1.005–1.030)
UUROB: 0.2 mg/dL (ref 0.2–1.0)
pH, UA: 5 (ref 5.0–7.5)

## 2015-12-30 LAB — MICROSCOPIC EXAMINATION: Bacteria, UA: NONE SEEN

## 2015-12-31 LAB — MICROALBUMIN / CREATININE URINE RATIO
CREATININE, UR: 27.9 mg/dL
MICROALB/CREAT RATIO: 50.2 mg/g{creat} — AB (ref 0.0–30.0)
Microalbumin, Urine: 14 ug/mL

## 2015-12-31 LAB — URINE CULTURE

## 2016-01-05 ENCOUNTER — Encounter: Payer: Self-pay | Admitting: Nurse Practitioner

## 2016-01-05 ENCOUNTER — Ambulatory Visit (INDEPENDENT_AMBULATORY_CARE_PROVIDER_SITE_OTHER): Payer: Medicare Other | Admitting: Nurse Practitioner

## 2016-01-05 VITALS — BP 147/82 | HR 80 | Temp 96.6°F | Ht 61.0 in | Wt 122.0 lb

## 2016-01-05 DIAGNOSIS — M62830 Muscle spasm of back: Secondary | ICD-10-CM | POA: Diagnosis not present

## 2016-01-05 LAB — URINALYSIS
Bilirubin, UA: NEGATIVE
Glucose, UA: NEGATIVE
Ketones, UA: NEGATIVE
Nitrite, UA: NEGATIVE
RBC, UA: NEGATIVE
Specific Gravity, UA: 1.02 (ref 1.005–1.030)
Urobilinogen, Ur: 0.2 mg/dL (ref 0.2–1.0)
pH, UA: 5.5 (ref 5.0–7.5)

## 2016-01-05 NOTE — Patient Instructions (Signed)

## 2016-01-05 NOTE — Progress Notes (Signed)
   Subjective:    Patient ID: Kalysa Plummer, female    DOB: 03-31-1928, 82 y.o.   MRN: [id number]  HPI  Patient  In c/o bacK pain- says that it is spasms that come and go.- started last week after she fell against her walker. Her sons took her to ER and was diagnosed with rib "Crack". But last week she started having spasms in mid back that come and go. Only last a around 30 seconds at a time. Rates pain 10/10 when it occurs. Pain is in same place everytime but does not radiate anywhere.   Review of Systems  Constitutional: Negative.   HENT: Negative.   Respiratory: Negative.   Cardiovascular: Negative.   Genitourinary: Negative.   Neurological: Negative.   Psychiatric/Behavioral: Negative.   All other systems reviewed and are negative.      Objective:   Physical Exam  Constitutional: She is oriented to person, place, and time. She appears well-developed and well-nourished. She appears distressed.  Cardiovascular: Normal rate, regular rhythm and normal heart sounds.   Pulmonary/Chest: Effort normal and breath sounds normal.  Neurological: She is alert and oriented to person, place, and time.  Skin: Skin is warm and dry.  Psychiatric: She has a normal mood and affect. Her behavior is normal. Judgment and thought content normal.   BP 147/82 mmHg  Pulse 80  Temp(Src) 96.6 F (35.9 C) (Oral)  Ht 5\' 1"  (1.549 m)  Wt 122 lb (55.339 kg)  BMI 23.06 kg/m2        Assessment & Plan:   1. Back spasm    Continue baclofen as rx Moist heat Rest  RTO prn  Mary-Margaret Hassell Done, FNP

## 2016-01-07 NOTE — Telephone Encounter (Signed)
Did VS sign this form? Thanks.

## 2016-01-08 ENCOUNTER — Telehealth: Payer: Self-pay | Admitting: Pulmonary Disease

## 2016-01-08 NOTE — Telephone Encounter (Signed)
Her symptoms certainly could be related to rib fracture.  She can try taking tylenol to help alleviate pain.  If she starts running fever or coughing up more phlegm, then she would need to have evaluation to determine if she is getting a respiratory infection.

## 2016-01-08 NOTE — Telephone Encounter (Signed)
Attempted to contact patient, voicemail not set up, unable to leave message, will call back.

## 2016-01-08 NOTE — Telephone Encounter (Signed)
Pt c/o some chest discomfort from fractured ribs.  Pt states that a few days ago she was leaning down to get something and was leaning on her walker and the walker popped up and hit her very hard on the chest below her left breast. Pt went to PCP/[facility] and had a cxr which showed no broken ribs, only fractured. She was advised that she would have some soreness and tenderness to the area for several days until it healed.  The patient states that now she is having "spasms" which are sharp pains when she moves (walks/twists/bends) and the pain has moved across to her right chest and is now also in her right back. Denies any increased SOB, cough or mucus. Pt states that he pain feel very similar to the pain she had with previous PNA episodes.  Offered the patient an appt but she refused stating that she cannot get a ride in such short notice. Offered appts next week and the patient could not schedule anything at this time.  Pt is asking for VS rec's on if he feels these spasms and radiating pain could be all related to the fractured ribs or if she could be developing PNA.  There is no mention of any other abnormalities being seen on the CXR (12/26/15)  Please advise Dr Halford Chessman. Thanks.  (please reply to triage)

## 2016-01-08 NOTE — Telephone Encounter (Signed)
Noted  

## 2016-01-08 NOTE — Telephone Encounter (Signed)
Patient No Showed her 12/28/15 appt for O2 qualification.  Forms have not and will not be signed until this done.  Spoke with the patient - states that she is having issues with transportation and is going to be working with her ride to get her here on Tuesday 01/12/16 at 11:30.  Will let Dr Halford Chessman know so that he is aware that the forms for [facility] that are on his desk will need to be signed once this qualification is complete.

## 2016-01-11 NOTE — Telephone Encounter (Signed)
Spoke with pt and she verbalized understanding.  She is taking Tylenol for the pain and she will be here on 7/18 for 6 min walk.

## 2016-01-12 ENCOUNTER — Ambulatory Visit: Payer: Medicare Other

## 2016-01-13 ENCOUNTER — Other Ambulatory Visit: Payer: Self-pay | Admitting: Pulmonary Disease

## 2016-01-13 ENCOUNTER — Ambulatory Visit (INDEPENDENT_AMBULATORY_CARE_PROVIDER_SITE_OTHER): Payer: Medicare Other | Admitting: Pulmonary Disease

## 2016-01-13 DIAGNOSIS — J432 Centrilobular emphysema: Secondary | ICD-10-CM | POA: Diagnosis not present

## 2016-01-13 DIAGNOSIS — J9611 Chronic respiratory failure with hypoxia: Secondary | ICD-10-CM

## 2016-01-13 NOTE — Progress Notes (Signed)
She presented for ambulatory oximetry on room air.  Rest - HR 78, SpO2 92% Lap 1 - HR 79, SpO2 92% Lap 2 - HR 81, SpO2 92% Lap 3 - HR 80, SpO2 93%  Passed on these results, she does not qualify for home oxygen use.  Will arrange for overnight oximetry to determine if she qualifies for nocturnal oxygen therapy.  Chesley Mires, MD [facility] Pulmonary/Critical Care 01/13/2016, 2:06 PM Pager:  [phone] After 3pm call: [phone]

## 2016-01-13 NOTE — Telephone Encounter (Signed)
Pt seen today for amb oximetry. Pt did not desat. Forms filled out based on this information and faxed back to [facility] (ATTN: [place] Iles) ONO was ordered to check for desat. Nothing further needed at this time.

## 2016-01-15 ENCOUNTER — Other Ambulatory Visit: Payer: Self-pay | Admitting: Cardiovascular Disease

## 2016-01-15 DIAGNOSIS — J449 Chronic obstructive pulmonary disease, unspecified: Secondary | ICD-10-CM | POA: Diagnosis not present

## 2016-01-15 DIAGNOSIS — I509 Heart failure, unspecified: Secondary | ICD-10-CM | POA: Diagnosis not present

## 2016-01-15 DIAGNOSIS — I270 Primary pulmonary hypertension: Secondary | ICD-10-CM | POA: Diagnosis not present

## 2016-01-15 NOTE — Telephone Encounter (Signed)
REFILL 

## 2016-01-21 ENCOUNTER — Ambulatory Visit (INDEPENDENT_AMBULATORY_CARE_PROVIDER_SITE_OTHER): Payer: Medicare Other | Admitting: Pharmacist

## 2016-01-21 DIAGNOSIS — I480 Paroxysmal atrial fibrillation: Secondary | ICD-10-CM | POA: Diagnosis not present

## 2016-01-21 LAB — COAGUCHEK XS/INR WAIVED
INR: 5.8 — AB (ref 0.9–1.1)
Prothrombin Time: 69.4 s

## 2016-01-25 ENCOUNTER — Encounter: Payer: Self-pay | Admitting: Pharmacist

## 2016-01-25 ENCOUNTER — Telehealth: Payer: Self-pay | Admitting: Family Medicine

## 2016-01-25 NOTE — Telephone Encounter (Signed)
Daughter in law aware that note is ready for pick up

## 2016-01-27 ENCOUNTER — Ambulatory Visit (INDEPENDENT_AMBULATORY_CARE_PROVIDER_SITE_OTHER): Payer: Medicare Other | Admitting: Pharmacist

## 2016-01-27 DIAGNOSIS — I480 Paroxysmal atrial fibrillation: Secondary | ICD-10-CM

## 2016-01-27 LAB — COAGUCHEK XS/INR WAIVED
INR: 2.1 — AB (ref 0.9–1.1)
PROTHROMBIN TIME: 25.3 s

## 2016-02-02 ENCOUNTER — Other Ambulatory Visit: Payer: Self-pay | Admitting: Family Medicine

## 2016-02-03 ENCOUNTER — Telehealth: Payer: Self-pay | Admitting: Pulmonary Disease

## 2016-02-03 NOTE — Telephone Encounter (Signed)
Patient notified of ONO results. Nothing further needed.  

## 2016-02-03 NOTE — Telephone Encounter (Signed)
ONO with RA 01/15/16 >> test time 5 hrs 40 min.  Mean SpO2 88.6%, low SpO2 77%.  Spent 2 hrs 27 min with SpO2 < 88%.   Will have my nurse inform pt that ONO shows low oxygen at night.  She will need to continue supplemental oxygen at 2.5 liters with sleep.

## 2016-02-05 ENCOUNTER — Other Ambulatory Visit: Payer: Self-pay | Admitting: Family Medicine

## 2016-02-11 DIAGNOSIS — I259 Chronic ischemic heart disease, unspecified: Secondary | ICD-10-CM | POA: Diagnosis not present

## 2016-02-11 DIAGNOSIS — J449 Chronic obstructive pulmonary disease, unspecified: Secondary | ICD-10-CM | POA: Diagnosis not present

## 2016-02-12 ENCOUNTER — Other Ambulatory Visit: Payer: Self-pay | Admitting: Cardiovascular Disease

## 2016-02-16 ENCOUNTER — Ambulatory Visit (INDEPENDENT_AMBULATORY_CARE_PROVIDER_SITE_OTHER): Payer: Medicare Other | Admitting: Pharmacist

## 2016-02-16 DIAGNOSIS — I480 Paroxysmal atrial fibrillation: Secondary | ICD-10-CM | POA: Diagnosis not present

## 2016-02-16 DIAGNOSIS — Z7901 Long term (current) use of anticoagulants: Secondary | ICD-10-CM

## 2016-02-16 LAB — COAGUCHEK XS/INR WAIVED
INR: 2.7 — ABNORMAL HIGH (ref 0.9–1.1)
Prothrombin Time: 32.1 s

## 2016-02-19 ENCOUNTER — Other Ambulatory Visit: Payer: Self-pay | Admitting: Pharmacist

## 2016-02-19 ENCOUNTER — Other Ambulatory Visit: Payer: Self-pay | Admitting: Family Medicine

## 2016-02-22 DIAGNOSIS — Z7901 Long term (current) use of anticoagulants: Secondary | ICD-10-CM | POA: Diagnosis not present

## 2016-02-22 DIAGNOSIS — Z96642 Presence of left artificial hip joint: Secondary | ICD-10-CM | POA: Diagnosis not present

## 2016-02-22 DIAGNOSIS — I2510 Atherosclerotic heart disease of native coronary artery without angina pectoris: Secondary | ICD-10-CM | POA: Diagnosis not present

## 2016-02-22 DIAGNOSIS — H548 Legal blindness, as defined in USA: Secondary | ICD-10-CM | POA: Diagnosis not present

## 2016-02-22 DIAGNOSIS — E119 Type 2 diabetes mellitus without complications: Secondary | ICD-10-CM | POA: Diagnosis not present

## 2016-02-22 DIAGNOSIS — S0181XA Laceration without foreign body of other part of head, initial encounter: Secondary | ICD-10-CM | POA: Diagnosis not present

## 2016-02-22 DIAGNOSIS — I252 Old myocardial infarction: Secondary | ICD-10-CM | POA: Diagnosis not present

## 2016-02-22 DIAGNOSIS — S0003XA Contusion of scalp, initial encounter: Secondary | ICD-10-CM | POA: Diagnosis not present

## 2016-02-22 DIAGNOSIS — W19XXXA Unspecified fall, initial encounter: Secondary | ICD-10-CM | POA: Diagnosis not present

## 2016-02-22 DIAGNOSIS — S0191XA Laceration without foreign body of unspecified part of head, initial encounter: Secondary | ICD-10-CM | POA: Diagnosis not present

## 2016-02-22 DIAGNOSIS — M542 Cervicalgia: Secondary | ICD-10-CM | POA: Diagnosis not present

## 2016-02-22 DIAGNOSIS — Z794 Long term (current) use of insulin: Secondary | ICD-10-CM | POA: Diagnosis not present

## 2016-02-22 DIAGNOSIS — S199XXA Unspecified injury of neck, initial encounter: Secondary | ICD-10-CM | POA: Diagnosis not present

## 2016-02-25 ENCOUNTER — Ambulatory Visit (INDEPENDENT_AMBULATORY_CARE_PROVIDER_SITE_OTHER): Payer: Medicare Other | Admitting: Family Medicine

## 2016-02-25 ENCOUNTER — Encounter: Payer: Self-pay | Admitting: Family Medicine

## 2016-02-25 VITALS — BP 130/81 | HR 81 | Temp 97.6°F | Ht 61.0 in | Wt 122.1 lb

## 2016-02-25 DIAGNOSIS — S0990XD Unspecified injury of head, subsequent encounter: Secondary | ICD-10-CM | POA: Diagnosis not present

## 2016-02-25 NOTE — Progress Notes (Signed)
BP 130/81   Pulse 81   Temp 97.6 F (36.4 C) (Oral)   Ht 5\' 1"  (1.549 m)   Wt 122 lb 2 oz (55.4 kg)   BMI 23.08 kg/m    Subjective:    Patient ID: [name], female    DOB: 02-01-1928, 82 y.o.   MRN: [id number]  HPI: [name] is a 82 y.o. female presenting on 02/25/2016 for ER followup (fell 2 days ago while on vacation, laceration to back of head)   HPI Fall and head trauma Patient had a fall 2 days ago where she slipped because her walker tipped when it caught on a rock and she fell backwards and landed on the back of her head and received a small laceration to the back of her right head. [place] and she went to an emergency department there and had staples placed. They also did a CT scan per her and it was normal. She has a small headache but denies any new blurred vision or changes in taste or smell or numbness or weakness. Her vision is mostly gone though so she can't really tell the difference from where it was previously. She denies any bleeding that she knows of but she does have extensive bruising going down the back of her neck from this incident.  Relevant past medical, surgical, family and social history reviewed and updated as indicated. Interim medical history since our last visit reviewed. Allergies and medications reviewed and updated.  Review of Systems  Constitutional: Negative for fever.  Eyes: Negative for redness and visual disturbance (Patient is normally almost legally blind, so no changes in vision).  Respiratory: Negative for chest tightness and shortness of breath.   Cardiovascular: Negative for chest pain and leg swelling.  Genitourinary: Negative for difficulty urinating and dysuria.  Musculoskeletal: Negative for back pain and gait problem.  Skin: Positive for color change (Bruising on the back of her head and neck) and wound. Negative for rash.  Neurological: Negative for dizziness, syncope, speech  difficulty, weakness, light-headedness, numbness and headaches.  Psychiatric/Behavioral: Negative for agitation, behavioral problems, confusion, decreased concentration and dysphoric mood.  All other systems reviewed and are negative.   Per HPI unless specifically indicated above      Objective:    BP 130/81   Pulse 81   Temp 97.6 F (36.4 C) (Oral)   Ht 5\' 1"  (1.549 m)   Wt 122 lb 2 oz (55.4 kg)   BMI 23.08 kg/m   Wt Readings from Last 3 Encounters:  02/25/16 122 lb 2 oz (55.4 kg)  01/05/16 122 lb (55.3 kg)  12/26/15 121 lb (54.9 kg)    Physical Exam  Constitutional: She is oriented to person, place, and time. She appears well-developed and well-nourished. No distress.  Eyes: Conjunctivae and EOM are normal. Pupils are equal, round, and reactive to light.  Cardiovascular: Normal rate, regular rhythm, normal heart sounds and intact distal pulses.   No murmur heard. Pulmonary/Chest: Effort normal and breath sounds normal. No respiratory distress. She has no wheezes.  Musculoskeletal: Normal range of motion. She exhibits no edema or tenderness.  Neurological: She is alert and oriented to person, place, and time. Coordination normal.  Skin: Skin is warm and dry. Bruising, ecchymosis and laceration (Small 2 cm laceration on the back right occiput of her head. Staples are intact and no signs of infection) noted. No rash noted. She is not diaphoretic.  Psychiatric: She has  a normal mood and affect. Her behavior is normal.  Nursing note and vitals reviewed.     Assessment & Plan:   Problem List Items Addressed This Visit    None    Visit Diagnoses    Head trauma, subsequent encounter    -  Primary   Patient is seen [place] for head trauma, fall, had staples placed 2 days ago. Neurologically intact       Follow up plan: Return in about 7 days (around 03/03/2016), or if symptoms worsen or fail to improve, for Staple removal.  Counseling provided for all of the vaccine  components No orders of the defined types were placed in this encounter.   Caryl Pina, MD [facility] 02/25/2016, 4:11 PM

## 2016-03-03 ENCOUNTER — Ambulatory Visit (INDEPENDENT_AMBULATORY_CARE_PROVIDER_SITE_OTHER): Payer: Medicare Other | Admitting: Family Medicine

## 2016-03-03 ENCOUNTER — Encounter: Payer: Self-pay | Admitting: Family Medicine

## 2016-03-03 VITALS — BP 123/69 | HR 58 | Temp 97.6°F | Ht 61.0 in | Wt 121.8 lb

## 2016-03-03 DIAGNOSIS — S0990XD Unspecified injury of head, subsequent encounter: Secondary | ICD-10-CM | POA: Diagnosis not present

## 2016-03-03 DIAGNOSIS — IMO0002 Reserved for concepts with insufficient information to code with codable children: Secondary | ICD-10-CM

## 2016-03-03 DIAGNOSIS — Z4802 Encounter for removal of sutures: Secondary | ICD-10-CM | POA: Diagnosis not present

## 2016-03-03 NOTE — Progress Notes (Signed)
   BP 123/69   Pulse (!) 58   Temp 97.6 F (36.4 C) (Oral)   Ht 5\' 1"  (1.549 m)   Wt 121 lb 12.8 oz (55.2 kg)   BMI 23.01 kg/m    Subjective:    Patient ID: [name], female    DOB: 05/16/1928, 82 y.o.   MRN: [id number]  HPI: [name] is a 82 y.o. female presenting on 03/03/2016 for Suture / Staple Removal   HPI Staple removal from scalp laceration Patient is coming in for follow-up on right occiput scalp laceration. She has not had any drainage or fevers or chills or warmth from the site. It is still slightly tender but has greatly improved in that regard as well. The initial incident occurred on 02/23/2016. Patient is coming today for staple removal.  Relevant past medical, surgical, family and social history reviewed and updated as indicated. Interim medical history since our last visit reviewed. Allergies and medications reviewed and updated.  Review of Systems  Constitutional: Negative for chills and fever.  Respiratory: Negative for chest tightness and shortness of breath.   Cardiovascular: Negative for chest pain and leg swelling.  Musculoskeletal: Negative for neck pain and neck stiffness.  Skin: Positive for wound.  Neurological: Positive for headaches. Negative for dizziness, facial asymmetry, speech difficulty and numbness.  Psychiatric/Behavioral: Negative for agitation and behavioral problems.  All other systems reviewed and are negative.  Per HPI unless specifically indicated above     Objective:    BP 123/69   Pulse (!) 58   Temp 97.6 F (36.4 C) (Oral)   Ht 5\' 1"  (1.549 m)   Wt 121 lb 12.8 oz (55.2 kg)   BMI 23.01 kg/m   Wt Readings from Last 3 Encounters:  03/03/16 121 lb 12.8 oz (55.2 kg)  02/25/16 122 lb 2 oz (55.4 kg)  01/05/16 122 lb (55.3 kg)    Physical Exam  Constitutional: She is oriented to person, place, and time. She appears well-developed and well-nourished. No distress.  Eyes: Conjunctivae are normal.  Musculoskeletal:  Normal range of motion. She exhibits no edema.  Neurological: She is alert and oriented to person, place, and time. Coordination normal.  Skin: Skin is warm and dry. Bruising (Patient still has bruising down into the right aspect of her neck but it appears to be yellowing and healing) and laceration (Well healed, has 2 staples in it, no drainage or erythema or warmth, still has swelling) noted. No rash noted. She is not diaphoretic.  Psychiatric: She has a normal mood and affect. Her behavior is normal.  Nursing note and vitals reviewed.  Staple removal: 2 staples were removed without issue. No bleeding and patient tolerated procedure well.    Assessment & Plan:   Problem List Items Addressed This Visit    None    Visit Diagnoses    Encounter for staple removal    -  Primary   Laceration       Follow-up for laceration on the right occiput. Healing well. No drainage from it and minimal tenderness. Bruising is still present but healing, remove 2 staples       Follow up plan: Return if symptoms worsen or fail to improve.  Counseling provided for all of the vaccine components No orders of the defined types were placed in this encounter.   Caryl Pina, MD [facility] 03/03/2016, 12:08 PM

## 2016-03-08 DIAGNOSIS — E1151 Type 2 diabetes mellitus with diabetic peripheral angiopathy without gangrene: Secondary | ICD-10-CM | POA: Diagnosis not present

## 2016-03-08 DIAGNOSIS — L84 Corns and callosities: Secondary | ICD-10-CM | POA: Diagnosis not present

## 2016-03-08 DIAGNOSIS — B351 Tinea unguium: Secondary | ICD-10-CM | POA: Diagnosis not present

## 2016-03-13 DIAGNOSIS — J449 Chronic obstructive pulmonary disease, unspecified: Secondary | ICD-10-CM | POA: Diagnosis not present

## 2016-03-13 DIAGNOSIS — I259 Chronic ischemic heart disease, unspecified: Secondary | ICD-10-CM | POA: Diagnosis not present

## 2016-03-17 ENCOUNTER — Encounter: Payer: Self-pay | Admitting: Family Medicine

## 2016-03-17 ENCOUNTER — Ambulatory Visit (INDEPENDENT_AMBULATORY_CARE_PROVIDER_SITE_OTHER): Payer: Medicare Other | Admitting: Family Medicine

## 2016-03-17 VITALS — BP 167/74 | HR 56 | Temp 97.4°F | Ht 61.0 in | Wt 121.0 lb

## 2016-03-17 DIAGNOSIS — Z0000 Encounter for general adult medical examination without abnormal findings: Secondary | ICD-10-CM

## 2016-03-17 DIAGNOSIS — M7741 Metatarsalgia, right foot: Secondary | ICD-10-CM | POA: Diagnosis not present

## 2016-03-17 DIAGNOSIS — S0512XA Contusion of eyeball and orbital tissues, left eye, initial encounter: Secondary | ICD-10-CM

## 2016-03-17 DIAGNOSIS — I10 Essential (primary) hypertension: Secondary | ICD-10-CM | POA: Diagnosis not present

## 2016-03-17 DIAGNOSIS — G5761 Lesion of plantar nerve, right lower limb: Secondary | ICD-10-CM | POA: Diagnosis not present

## 2016-03-17 DIAGNOSIS — E559 Vitamin D deficiency, unspecified: Secondary | ICD-10-CM

## 2016-03-17 DIAGNOSIS — N183 Chronic kidney disease, stage 3 (moderate): Secondary | ICD-10-CM

## 2016-03-17 DIAGNOSIS — R0989 Other specified symptoms and signs involving the circulatory and respiratory systems: Secondary | ICD-10-CM | POA: Diagnosis not present

## 2016-03-17 DIAGNOSIS — E785 Hyperlipidemia, unspecified: Secondary | ICD-10-CM

## 2016-03-17 DIAGNOSIS — E1122 Type 2 diabetes mellitus with diabetic chronic kidney disease: Secondary | ICD-10-CM | POA: Diagnosis not present

## 2016-03-17 DIAGNOSIS — Z23 Encounter for immunization: Secondary | ICD-10-CM

## 2016-03-17 DIAGNOSIS — Z794 Long term (current) use of insulin: Secondary | ICD-10-CM | POA: Diagnosis not present

## 2016-03-17 DIAGNOSIS — M79671 Pain in right foot: Secondary | ICD-10-CM | POA: Diagnosis not present

## 2016-03-17 DIAGNOSIS — I480 Paroxysmal atrial fibrillation: Secondary | ICD-10-CM

## 2016-03-17 LAB — URINALYSIS, COMPLETE
BILIRUBIN UA: NEGATIVE
Glucose, UA: NEGATIVE
Ketones, UA: NEGATIVE
Nitrite, UA: NEGATIVE
PH UA: 5.5 (ref 5.0–7.5)
RBC UA: NEGATIVE
Specific Gravity, UA: 1.015 (ref 1.005–1.030)
UUROB: 0.2 mg/dL (ref 0.2–1.0)

## 2016-03-17 LAB — MICROSCOPIC EXAMINATION: Bacteria, UA: NONE SEEN

## 2016-03-17 LAB — COAGUCHEK XS/INR WAIVED
INR: 1.5 — AB (ref 0.9–1.1)
Prothrombin Time: 18.1 s

## 2016-03-17 LAB — BAYER DCA HB A1C WAIVED: HB A1C (BAYER DCA - WAIVED): 7 % — ABNORMAL HIGH (ref <7.0)

## 2016-03-17 NOTE — Progress Notes (Signed)
Subjective:    Patient ID: [name], female    DOB: 14-Feb-1928, 82 y.o.   MRN: [id number]  HPI Pt here for follow up and management of chronic medical problems which includes diabetes, a fib, hyperlipidemia and hypertension. She is taking medications regularly. She is accompanied today by her daughter in law, Coralyn Mark.This patient has a multitude of problems including congestive heart failure atrial fibrillation history of a stroke a hip fracture and frequent urinary tract infections. The patient is doing well overall. She recently had a fall and a laceration to her parietal scalp and this appears to be healing well. She denies any chest pain or shortness of breath anymore than usual. She uses the oxygen only at nighttime and occasionally during the day. She does have a black eye on the left side today. Were not sure how this happened or if she does rub her eye she does not recall any falls recently. She denies any problems with her stomach including heartburn indigestion nausea vomiting diarrhea or blood in the stool. She does take iron in her stools or dark-colored because of that. She is passing her water without problems but does wear a depends for this. She is very alert and visually impaired.     Patient Active Problem List   Diagnosis Date Noted  . Atherosclerotic peripheral vascular disease ([place]) 11/04/2015  . Intertrochanteric fracture of left hip ([place])   . Neck pain   . Postoperative anemia due to acute blood loss   . Hip fracture requiring operative repair ([place]) 01/15/2015  . Preop cardiovascular exam 01/14/2015  . Hip fracture ([place]) 01/13/2015  . Osteopenia 01/05/2015  . Primary osteoarthritis involving multiple joints 06/12/2014  . CHF (congestive heart failure) ([place]) 12/28/2013  . Atypical chest pain 12/18/2013  . Upper airway cough syndrome 05/20/2013  . Pain in joint, ankle and foot 12/06/2012  . Olecranon bursitis of left elbow 08/26/2012  . Scalp lesion 08/26/2012  .  Counseling regarding advanced directives 08/26/2012  . DM type 2 causing CKD stage 3 ([place]) 07/30/2012  . Sacral fracture, closed ([place]) 07/22/2012  . Ataxia 07/20/2012  . Weakness of both legs 07/20/2012  . Falls frequently 07/20/2012  . RBBB 08/25/2011  . CAD, remote RCA PCI, subsequently noted to be occluded, last cath 8/11 08/25/2011  . PVD (peripheral vascular disease), moderate carotid and LSCA disease 08/25/2011  . HTN (hypertension) 08/22/2011  . AK (actinic keratosis) 07/17/2011  . Shoulder pain 04/03/2011  . Gait instability 10/11/2010  . PAF, recurrance this admission, on chronic Amio/ Coumadin 07/06/2010  . COPD with emphysema ([place]) 06/16/2010  . Chronic respiratory failure with hypoxia ([place]) 06/07/2010  . Hyperlipidemia LDL goal <70 02/11/2010   Outpatient Encounter Prescriptions as of 03/17/2016  Medication Sig  . ACCU-CHEK AVIVA PLUS test strip Test tid. Dx E11.9  . acetaminophen (TYLENOL) 500 MG tablet Take 500 mg by mouth every 4 (four) hours as needed.  . ALPRAZolam (XANAX) 0.25 MG tablet TAKE 1/2 TO 1 TABLET 2 TIMES A DAY AS NEEDED FOR ANXIETY  . amiodarone (PACERONE) 200 MG tablet TAKE (1/2) TABLET DAILY.  Marland Kitchen atorvastatin (LIPITOR) 20 MG tablet TAKE 1 TABLET DAILY IN THE EVENING  . Azelastine HCl 0.15 % SOLN Place 1 spray into both nostrils 2 (two) times daily as needed (congestion).  . baclofen (LIORESAL) 10 MG tablet TAKE 1/2 TABLET EVERY 8 HOURS AS NEEDED FOR MUSCLE SPASM  . budesonide-formoterol (SYMBICORT) 160-4.5 MCG/ACT inhaler Inhale 1 puff into the lungs 2 (two) times  daily.  . Cholecalciferol (VITAMIN D PO) Take 1 capsule by mouth daily.   Marland Kitchen docusate sodium (COLACE) 100 MG capsule Take 100 mg by mouth 2 (two) times daily.  . feeding supplement, GLUCERNA SHAKE, (GLUCERNA SHAKE) LIQD Take 237 mLs by mouth 2 (two) times daily between meals.  . ferrous sulfate 325 (65 FE) MG tablet TAKE 1 TABLET ONCE DAILY WITH BREAKFAST  . fluticasone (FLONASE) 50 MCG/ACT nasal  spray Place 1 spray into both nostrils 2 (two) times daily.  . furosemide (LASIX) 40 MG tablet TAKE  (1)  TABLET TWICE A DAY.  Marland Kitchen glucosamine-chondroitin 500-400 MG tablet Take 1 tablet by mouth 2 (two) times daily.  Marland Kitchen HUMALOG KWIKPEN 100 UNIT/ML KiwkPen INJECT UP TO 8 UNITS BEFORE MEALS AS DIRECTED BY SLIDING SCALE. MAX 24 UNITS PER DAY  . hydrocortisone (ANUSOL-HC) 25 MG suppository Place 1 suppository (25 mg total) rectally 2 (two) times daily as needed for hemorrhoids or itching.  . Insulin Detemir (LEVEMIR FLEXTOUCH) 100 UNIT/ML Pen INJECT 7 UNITS SQ EVERY MORNING  . Insulin Pen Needle (PEN NEEDLES) 31G X 6 MM MISC Use to administer insulin up to qid  . Ipratropium-Albuterol (COMBIVENT) 20-100 MCG/ACT AERS respimat Inhale 1 puff into the lungs every 6 (six) hours as needed for wheezing or shortness of breath.  . metoprolol succinate (TOPROL-XL) 25 MG 24 hr tablet TAKE (1/2) TABLET DAILY.  . Multiple Vitamins-Minerals (PRESERVISION/LUTEIN) CAPS Take 1 capsule by mouth daily.   Marland Kitchen NIFEDICAL XL 30 MG 24 hr tablet Take 1 tablet (30 mg total) by mouth daily.  . nitroGLYCERIN (NITROSTAT) 0.4 MG SL tablet Place 1 tablet (0.4 mg total) under the tongue every 5 (five) minutes as needed for chest pain.  . pantoprazole (PROTONIX) 40 MG tablet Take 1 tablet (40 mg total) by mouth daily.  . polyethylene glycol powder (GLYCOLAX/MIRALAX) powder Take 17 g by mouth daily as needed.  . potassium chloride SA (K-DUR,KLOR-CON) 20 MEQ tablet TAKE 1 TABLET DAILY  . PRODIGY TWIST TOP LANCETS 28G MISC CHECK BLOOD SUGAR UP TO 4 TIMES A DAY  . simethicone (MYLICON) 80 MG chewable tablet Chew 80 mg by mouth at bedtime as needed for flatulence.  . traMADol (ULTRAM) 50 MG tablet Take 1 tablet (50 mg total) by mouth every 6 (six) hours as needed for severe pain.  . vitamin C (ASCORBIC ACID) 500 MG tablet Take 500 mg by mouth at bedtime.   Marland Kitchen warfarin (COUMADIN) 5 MG tablet Take 1/2 tablet on mondays and fridays, take 1 tablet  all other days   No facility-administered encounter medications on file as of 03/17/2016.       Review of Systems  Constitutional: Negative.  Negative for fever.  HENT: Positive for congestion and postnasal drip.   Eyes: Negative.   Respiratory: Negative.  Negative for cough.   Cardiovascular: Negative.   Gastrointestinal: Negative.   Endocrine: Negative.   Genitourinary: Negative.   Musculoskeletal: Positive for neck stiffness (pops at night).  Skin: Negative.        Sore/lesion  - left lower leg  Allergic/Immunologic: Negative.   Neurological: Negative.   Hematological: Negative.   Psychiatric/Behavioral: Negative.        Objective:   Physical Exam  Constitutional: She is oriented to person, place, and time. She appears well-developed and well-nourished. No distress.  The patient is pleasant and alert especially for age of 81 years.  HENT:  Head: Normocephalic.  Right Ear: External ear normal.  Left Ear: External ear normal.  Nose: Nose normal.  Mouth/Throat: Oropharynx is clear and moist.  The laceration on her scalp is well-healed. There is a left orbital contusion or bruise and neither the daughter-in-law or the patient know how this occurred.  Eyes: Conjunctivae and EOM are normal. Pupils are equal, round, and reactive to light. Right eye exhibits no discharge. Left eye exhibits no discharge. No scleral icterus.  Neck: Normal range of motion. Neck supple. No thyromegaly present.  There are bilateral carotid bruits  Cardiovascular: Normal rate, regular rhythm, normal heart sounds and intact distal pulses.   No murmur heard. Heart is regular at 60/m  Pulmonary/Chest: Effort normal. No respiratory distress. She has no wheezes. She has no rales.  Breast sounds are somewhat diminished bilaterally  Abdominal: Soft. Bowel sounds are normal. She exhibits no mass. There is no tenderness. There is no rebound and no guarding.  No abdominal tenderness masses or bruits    Musculoskeletal: Normal range of motion. She exhibits no edema.  Lymphadenopathy:    She has no cervical adenopathy.  Neurological: She is alert and oriented to person, place, and time. She has normal reflexes. No cranial nerve deficit.  Skin: Skin is warm and dry. No rash noted.  There is a small blister on the left lower leg and we will continue to monitor this.  Psychiatric: She has a normal mood and affect. Her behavior is normal. Judgment and thought content normal.  Nursing note and vitals reviewed.  BP (!) 167/74 (BP Location: Left Arm)   Pulse (!) 56   Temp 97.4 F (36.3 C) (Oral)   Ht 5\' 1"  (1.549 m)   Wt 121 lb (54.9 kg)   BMI 22.86 kg/m         Assessment & Plan:  1. Paroxysmal atrial fibrillation (HCC) -The heart had a regular rate and rhythm today at 60/m and she will continue to follow-up with cardiology  2. Type 2 diabetes mellitus with stage 3 chronic kidney disease, with long-term current use of insulin ([place]) -The patient administers her on insulin. She says her blood sugars fasting in the morning are about 105 and this is pretty typical for every morning.  3. Hyperlipidemia -Continue with aggressive therapeutic lifestyle changes  4. Essential hypertension -The blood pressure is slightly elevated on the systolic side today but there will be no change in treatment  5. Vitamin D deficiency -And continue current treatment pending results of lab work  6. Contusion of left orbital tissues, initial encounter -We will continue to monitor this  7. Left orbital contusion  8. Bilateral carotid bruits  No orders of the defined types were placed in this encounter.  Patient Instructions                       Medicare Annual Wellness Visit  [facility] and the medical providers at [facility] strive to bring you the best medical care.  In doing so we not only want to address your current medical conditions and concerns but also to detect  new conditions early and prevent illness, disease and health-related problems.    Medicare offers a yearly Wellness Visit which allows our clinical staff to assess your need for preventative services including immunizations, lifestyle education, counseling to decrease risk of preventable diseases and screening for fall risk and other medical concerns.    This visit is provided free of charge (no copay) for all Medicare recipients. The clinical pharmacists at [facility] have  begun to conduct these Wellness Visits which will also include a thorough review of all your medications.    As you primary medical provider recommend that you make an appointment for your Annual Wellness Visit if you have not done so already this year.  You may set up this appointment before you leave today or you may call back [id number]) and schedule an appointment.  Please make sure when you call that you mention that you are scheduling your Annual Wellness Visit with the clinical pharmacist so that the appointment may be made for the proper length of time.     Continue current medications. Continue good therapeutic lifestyle changes which include good diet and exercise. Fall precautions discussed with patient. If an FOBT was given today- please return it to our front desk. If you are over 69 years old - you may need Prevnar 56 or the adult Pneumonia vaccine.  **Flu shots are available--- please call and schedule a FLU-CLINIC appointment**  After your visit with [place] today you will receive a survey in the mail or online from [employer] regarding your care with [place]. Please take a moment to fill this out. Your feedback is very important to [place] as you can help [place] better understand your patient needs as well as improve your experience and satisfaction. WE CARE ABOUT YOU!!!   The patient should continue to be careful and did not put yourself at risk for falling She should move slowly and make sure her home  is well lit She should continue to follow-up with the cardiologist and with her ophthalmologist and podiatrist. Flu shot she received today may make her arm sore    Arrie Senate MD

## 2016-03-17 NOTE — Patient Instructions (Addendum)
Medicare Annual Wellness Visit  [facility] and the medical providers at [facility] strive to bring you the best medical care.  In doing so we not only want to address your current medical conditions and concerns but also to detect new conditions early and prevent illness, disease and health-related problems.    Medicare offers a yearly Wellness Visit which allows our clinical staff to assess your need for preventative services including immunizations, lifestyle education, counseling to decrease risk of preventable diseases and screening for fall risk and other medical concerns.    This visit is provided free of charge (no copay) for all Medicare recipients. The clinical pharmacists at [facility] have begun to conduct these Wellness Visits which will also include a thorough review of all your medications.    As you primary medical provider recommend that you make an appointment for your Annual Wellness Visit if you have not done so already this year.  You may set up this appointment before you leave today or you may call back [phone] and schedule an appointment.  Please make sure when you call that you mention that you are scheduling your Annual Wellness Visit with the clinical pharmacist so that the appointment may be made for the proper length of time.     Continue current medications. Continue good therapeutic lifestyle changes which include good diet and exercise. Fall precautions discussed with patient. If an FOBT was given today- please return it to our front desk. If you are over 18 years old - you may need Prevnar 78 or the adult Pneumonia vaccine.  **Flu shots are available--- please call and schedule a FLU-CLINIC appointment**  After your visit with [place] today you will receive a survey in the mail or online from [employer] regarding your care with [place]. Please take a moment to fill this out. Your feedback is very  important to [place] as you can help [place] better understand your patient needs as well as improve your experience and satisfaction. WE CARE ABOUT YOU!!!   The patient should continue to be careful and did not put yourself at risk for falling She should move slowly and make sure her home is well lit She should continue to follow-up with the cardiologist and with her ophthalmologist and podiatrist. Flu shot she received today may make her arm sore

## 2016-03-18 LAB — CBC WITH DIFFERENTIAL/PLATELET
BASOS ABS: 0.1 10*3/uL (ref 0.0–0.2)
Basos: 1 %
EOS (ABSOLUTE): 0.1 10*3/uL (ref 0.0–0.4)
Eos: 1 %
Hematocrit: 40.4 % (ref 34.0–46.6)
Hemoglobin: 12.7 g/dL (ref 11.1–15.9)
IMMATURE GRANS (ABS): 0 10*3/uL (ref 0.0–0.1)
Immature Granulocytes: 0 %
LYMPHS: 17 %
Lymphocytes Absolute: 1.1 10*3/uL (ref 0.7–3.1)
MCH: 31 pg (ref 26.6–33.0)
MCHC: 31.4 g/dL — ABNORMAL LOW (ref 31.5–35.7)
MCV: 99 fL — ABNORMAL HIGH (ref 79–97)
Monocytes Absolute: 0.5 10*3/uL (ref 0.1–0.9)
Monocytes: 8 %
NEUTROS ABS: 4.6 10*3/uL (ref 1.4–7.0)
NEUTROS PCT: 73 %
PLATELETS: 228 10*3/uL (ref 150–379)
RBC: 4.1 x10E6/uL (ref 3.77–5.28)
RDW: 13.3 % (ref 12.3–15.4)
WBC: 6.4 10*3/uL (ref 3.4–10.8)

## 2016-03-18 LAB — BMP8+EGFR
BUN / CREAT RATIO: 16 (ref 12–28)
BUN: 17 mg/dL (ref 8–27)
CHLORIDE: 98 mmol/L (ref 96–106)
CO2: 31 mmol/L — ABNORMAL HIGH (ref 18–29)
Calcium: 9.8 mg/dL (ref 8.7–10.3)
Creatinine, Ser: 1.07 mg/dL — ABNORMAL HIGH (ref 0.57–1.00)
GFR calc Af Amer: 54 mL/min/{1.73_m2} — ABNORMAL LOW (ref >59)
GFR calc non Af Amer: 47 mL/min/{1.73_m2} — ABNORMAL LOW (ref >59)
GLUCOSE: 102 mg/dL — AB (ref 65–99)
POTASSIUM: 4.1 mmol/L (ref 3.5–5.2)
SODIUM: 144 mmol/L (ref 134–144)

## 2016-03-18 LAB — LIPID PANEL
CHOLESTEROL TOTAL: 180 mg/dL (ref 100–199)
Chol/HDL Ratio: 1.9 ratio units (ref 0.0–4.4)
HDL: 97 mg/dL (ref >39)
LDL CALC: 64 mg/dL (ref 0–99)
TRIGLYCERIDES: 95 mg/dL (ref 0–149)
VLDL CHOLESTEROL CAL: 19 mg/dL (ref 5–40)

## 2016-03-18 LAB — HEPATIC FUNCTION PANEL
ALT: 29 IU/L (ref 0–32)
AST: 20 IU/L (ref 0–40)
Albumin: 4.3 g/dL (ref 3.5–4.7)
Alkaline Phosphatase: 109 IU/L (ref 39–117)
BILIRUBIN, DIRECT: 0.17 mg/dL (ref 0.00–0.40)
Bilirubin Total: 0.5 mg/dL (ref 0.0–1.2)
TOTAL PROTEIN: 6.7 g/dL (ref 6.0–8.5)

## 2016-03-18 LAB — VITAMIN D 25 HYDROXY (VIT D DEFICIENCY, FRACTURES): Vit D, 25-Hydroxy: 30.8 ng/mL (ref 30.0–100.0)

## 2016-03-24 ENCOUNTER — Ambulatory Visit (INDEPENDENT_AMBULATORY_CARE_PROVIDER_SITE_OTHER): Payer: Medicare Other | Admitting: Pharmacist

## 2016-03-24 ENCOUNTER — Encounter: Payer: Self-pay | Admitting: Pulmonary Disease

## 2016-03-24 DIAGNOSIS — I480 Paroxysmal atrial fibrillation: Secondary | ICD-10-CM

## 2016-03-24 LAB — COAGUCHEK XS/INR WAIVED
INR: 2.5 — AB (ref 0.9–1.1)
PROTHROMBIN TIME: 30.5 s

## 2016-04-02 ENCOUNTER — Other Ambulatory Visit: Payer: Self-pay | Admitting: Family Medicine

## 2016-04-12 ENCOUNTER — Encounter: Payer: Self-pay | Admitting: Pharmacist

## 2016-04-12 ENCOUNTER — Ambulatory Visit (INDEPENDENT_AMBULATORY_CARE_PROVIDER_SITE_OTHER): Payer: Medicare Other | Admitting: Pharmacist

## 2016-04-12 ENCOUNTER — Other Ambulatory Visit: Payer: Self-pay | Admitting: Cardiovascular Disease

## 2016-04-12 VITALS — BP 126/60 | HR 60 | Ht 60.0 in | Wt 121.0 lb

## 2016-04-12 DIAGNOSIS — Z0000 Encounter for general adult medical examination without abnormal findings: Secondary | ICD-10-CM | POA: Diagnosis not present

## 2016-04-12 DIAGNOSIS — I480 Paroxysmal atrial fibrillation: Secondary | ICD-10-CM | POA: Diagnosis not present

## 2016-04-12 DIAGNOSIS — J449 Chronic obstructive pulmonary disease, unspecified: Secondary | ICD-10-CM | POA: Diagnosis not present

## 2016-04-12 DIAGNOSIS — I259 Chronic ischemic heart disease, unspecified: Secondary | ICD-10-CM | POA: Diagnosis not present

## 2016-04-12 DIAGNOSIS — H9193 Unspecified hearing loss, bilateral: Secondary | ICD-10-CM

## 2016-04-12 LAB — COAGUCHEK XS/INR WAIVED
INR: 4 — AB (ref 0.9–1.1)
Prothrombin Time: 48.2 s

## 2016-04-12 NOTE — Progress Notes (Signed)
Patient ID: [name], female   DOB: [birth year], 82 y.o.   MRN: [id number]   Subjective:   [name] is a 82 y.o. female who presents for a Subsequent Medicare Annual Wellness Visit and to recheck INR/ PT.  [name] lives in an apartment by herself in [place], [place].  She does not drive anymore but has great family support from son and daughter in law.   She uses either a cane or rolling walker for ambulation support.   She socializes with her neighbors and has a dog that keeps her company.  Current Medications (verified) Outpatient Encounter Prescriptions as of 04/12/2016  Medication Sig  . ACCU-CHEK AVIVA PLUS test strip Test tid. Dx E11.9  . acetaminophen (TYLENOL) 500 MG tablet Take 500 mg by mouth every 4 (four) hours as needed.  . ALPRAZolam (XANAX) 0.25 MG tablet TAKE 1/2 TO 1 TABLET 2 TIMES A DAY AS NEEDED FOR ANXIETY  . amiodarone (PACERONE) 200 MG tablet Take 0.5 tablets (100 mg total) by mouth daily. Need appointment before anymore refills  . atorvastatin (LIPITOR) 20 MG tablet TAKE 1 TABLET DAILY IN THE EVENING  . Azelastine HCl 0.15 % SOLN Place 1 spray into both nostrils 2 (two) times daily as needed (congestion).  . baclofen (LIORESAL) 10 MG tablet TAKE 1/2 TABLET EVERY 8 HOURS AS NEEDED FOR MUSCLE SPASM  . budesonide-formoterol (SYMBICORT) 160-4.5 MCG/ACT inhaler Inhale 1 puff into the lungs 2 (two) times daily.  . cholecalciferol (VITAMIN D) 1000 units tablet Take 1,000 Units by mouth daily.  Marland Kitchen docusate sodium (COLACE) 100 MG capsule Take 100 mg by mouth 2 (two) times daily.  . ferrous sulfate 325 (65 FE) MG tablet TAKE 1 TABLET ONCE DAILY WITH BREAKFAST  . fluticasone (FLONASE) 50 MCG/ACT nasal spray Place 1 spray into both nostrils 2 (two) times daily.  . furosemide (LASIX) 40 MG tablet TAKE  (1)  TABLET TWICE A DAY.  Marland Kitchen glucosamine-chondroitin 500-400 MG tablet Take 1 tablet by mouth 2 (two) times daily.  Marland Kitchen HUMALOG KWIKPEN 100 UNIT/ML KiwkPen INJECT UP TO 8 UNITS  BEFORE MEALS AS DIRECTED BY SLIDING SCALE. MAX 24 UNITS PER DAY  . hydrocortisone (ANUSOL-HC) 25 MG suppository Place 1 suppository (25 mg total) rectally 2 (two) times daily as needed for hemorrhoids or itching.  . Insulin Detemir (LEVEMIR FLEXTOUCH) 100 UNIT/ML Pen Inject 8 Units into the skin. INJECT 7 UNITS SQ EVERY MORNING  . Insulin Pen Needle (PEN NEEDLES) 31G X 6 MM MISC Use to administer insulin up to qid  . Ipratropium-Albuterol (COMBIVENT) 20-100 MCG/ACT AERS respimat Inhale 1 puff into the lungs every 6 (six) hours as needed for wheezing or shortness of breath.  . metoprolol succinate (TOPROL-XL) 25 MG 24 hr tablet TAKE (1/2) TABLET DAILY.  . Multiple Vitamins-Minerals (PRESERVISION/LUTEIN) CAPS Take 1 capsule by mouth daily.   Marland Kitchen NIFEDICAL XL 30 MG 24 hr tablet Take 1 tablet (30 mg total) by mouth daily.  . nitroGLYCERIN (NITROSTAT) 0.4 MG SL tablet Place 1 tablet (0.4 mg total) under the tongue every 5 (five) minutes as needed for chest pain.  . pantoprazole (PROTONIX) 40 MG tablet Take 1 tablet (40 mg total) by mouth daily.  . polyethylene glycol powder (GLYCOLAX/MIRALAX) powder Take 17 g by mouth daily as needed.  . potassium chloride SA (K-DUR,KLOR-CON) 20 MEQ tablet TAKE 1 TABLET DAILY  . PRODIGY TWIST TOP LANCETS 28G MISC CHECK BLOOD SUGAR UP TO 4 TIMES A DAY  . simethicone (MYLICON) 80 MG chewable tablet  Chew 80 mg by mouth at bedtime as needed for flatulence.  . traMADol (ULTRAM) 50 MG tablet Take 1 tablet (50 mg total) by mouth every 6 (six) hours as needed for severe pain.  . vitamin C (ASCORBIC ACID) 500 MG tablet Take 500 mg by mouth at bedtime.   Marland Kitchen warfarin (COUMADIN) 5 MG tablet Take 1/2 tablet on mondays and fridays, take 1 tablet all other days  . [DISCONTINUED] Cholecalciferol (VITAMIN D PO) Take 1 capsule by mouth daily.   . [DISCONTINUED] feeding supplement, GLUCERNA SHAKE, (GLUCERNA SHAKE) LIQD Take 237 mLs by mouth 2 (two) times daily between meals. (Patient not  taking: Reported on 04/12/2016)   No facility-administered encounter medications on file as of 04/12/2016.     Allergies (verified) Atorvastatin; Codeine; Morphine; Other; Rosuvastatin; and Iohexol   History: Past Medical History:  Diagnosis Date  . Angina   . Anxiety   . Arthritis    "all over"  . Bleeds easily ([place])   . Bradycardia 08/24/2011  . CAD (coronary artery disease)    Cath in 2006 showed an occluded RCA with left-to-right collaterals & otherwise noncritical CAD with EF=25-30% at that time. EF subsequently improved to normal by 2D ECHO. Cath Aug. 2011 showed unchanged anatomy. > medical therapy recommended  . Carotid artery disease (HCC)    Moderate, right greater than left which we following by duplex ultrasound. [place] 12/05/11 = RIght Bulb/Prox ICA: Moderate to severe amt of fibrous plaque elevating velocities w/in prox segment of ICA. Consistent w/a 50-69% diameter reduction. Left Bulb/Prox ICA: Moderate amt of fibrous plaque slightly elevating velocities w/in the prox segment of the ICA. Consistent w/a 0-49% diameter reduction.   . Carotid bruit   . CHF (congestive heart failure) ([place])    Hospitalized 08/22/11-08/26/11 with CHF secondary to diastolic dysfunction & hospitilized in 07/2012 with a similar problem. TTE 08/23/11 = normal EF.  Marland Kitchen Chronic lower back pain   . COPD (chronic obstructive pulmonary disease) (HCC)    GOLD 4 - PFT 06/08/10 FEV1 0.93 (62%), FEV1% 60, TLC 2.84 (69%0, DLCO 54%, +BD) On home O2.  Marland Kitchen COPD with emphysema ([place])   . Coronary atherosclerosis of unspecified type of vessel, native or graft   . Diastolic dysfunction    Grade II  . Edema   . Fall during current hospitalization    "was at [facility]; fell; transferred to [facility]" (01/13/2015)  . Family history of adverse reaction to anesthesia    "daughter gets PONV"  . GERD (gastroesophageal reflux disease)   . History of stress test 07/02/09   Mild perfusion defect seen in the Basal Inferior region(s). This  is consistent with an infarct/scar.  Post-stress EF=56%. Global LV systolic function is normal.  EKG is negative for ischemia.  No significant iscemia detected. Low risk scan.  . Hyperlipidemia   . Hypertension   . Hypoxemic respiratory failure, chronic (HCC)    uses 2.5 liters of oxygen with sleep  . Lower extremity edema    ECHO 07/21/12 = EF 55-60%, performed for TIA. Responding to diurectics. Continue diuresis w/ a goal dry weight of <160lb. Venous Duplex 07/27/11 = Right lower extremity: no evidence of thrombus or thrombophlebitis. Essentially normal right lower extremity venous duplex Doppler evaluation.  . Mitral valve regurgitation    Mild to moderate by ECHO 07/21/12  . Myocardial infarction    "she's had several" (01/13/2015)  . On home oxygen therapy    "?L; prn" (01/13/2015)  . PAF (paroxysmal atrial fibrillation) ([place])  In the past, on coumadin  . Pneumonia ~ 2013  . PONV (postoperative nausea and vomiting)   . RBBB (right bundle branch block)    Chronic  . Secondary pulmonary hypertension   . Shortness of breath   . Sleep apnea   . Sleep-related hypoventilation   . Stroke [facility])    "/CT scan; ~ 2014; didn't even know she'd had it"  (01/13/2015)  . Type II diabetes mellitus (HCC)    goal A1C is 8, to avoid hypoglycemia   Past Surgical History:  Procedure Laterality Date  . CARDIAC CATHETERIZATION  2006 & 2011   Cath in 2006 showed an occluded RCA with left-to-right collaterals & otherwise noncritical CAD with EF=25-30% at that time. EF subsequently improved to normal by 2D ECHO. Cath Aug. 2011 showed unchanged anatomy. > medical therapy recommended.  . Carotid Duplex  12/05/11   Moderate, right greater than left which we following by duplex ultrasound. [place] 12/05/11 = RIght Bulb/Prox ICA: Moderate to severe amt of fibrous plaque elevating velocities w/in prox segment of ICA. Consistent w/a 50-69% diameter reduction. Left Bulb/Prox ICA: Moderate amt of fibrous plaque slightly  elevating velocities w/in the prox segment of the ICA. Consistent w/a 0-49% diameter reduction.   Marland Kitchen CATARACT EXTRACTION W/ INTRAOCULAR LENS  IMPLANT, BILATERAL Bilateral   . CORONARY ANGIOPLASTY    . CORONARY ANGIOPLASTY WITH STENT PLACEMENT     x2  . FEMUR IM NAIL Left 01/15/2015   Procedure: INTRAMEDULLARY (IM) NAIL FEMORAL LEFT ;  Surgeon: Netta Cedars, MD;  Location: [facility];  Service: Orthopedics;  Laterality: Left;  . SHOULDER OPEN ROTATOR CUFF REPAIR Right 07/2005  . TONSILLECTOMY    . TOTAL ABDOMINAL HYSTERECTOMY  ~ 1967  . TRANSVAGINAL TAPE (TVT) REMOVAL    . Venous Duplex  07/27/11   Venous Duplex 07/27/11 = Right lower extremity: no evidence of thrombus or thrombophlebitis. Essentially normal right lower extremity venous duplex Doppler evaluation.   Family History  Problem Relation Age of Onset  . Cancer Mother     Skin  . Heart disease Mother   . Emphysema Father   . Heart disease Father   . Congestive Heart Failure Father   . Heart disease Brother   . Heart disease Brother   . Early death Sister    Social History   Occupational History  . Not on file.   Social History Main Topics  . Smoking status: Former Smoker    Packs/day: 1.50    Years: 15.00    Types: Cigarettes  . Smokeless tobacco: Never Used     Comment: Smoked 1.5 packs per day for 15 years, quit in 1995.  Marland Kitchen Alcohol use 3.6 oz/week    6 Glasses of wine per week     Comment: 01/13/2015 "couple glasses of wine maybe 3 days/wk"  . Drug use: No  . Sexual activity: No    Do you feel safe at home?  Yes Are there smokers in your home (other than you)? No  Dietary issues and exercise activities: Current Exercise Habits: Home exercise routine, Type of exercise: stretching;Other - see comments (chair exercises), Time (Minutes): 10, Frequency (Times/Week): 7, Weekly Exercise (Minutes/Week): 70, Intensity: Mild  Current Dietary habits:  Follows diabetic diet and controls portion sizes   Objective:    Today's  Vitals   04/12/16 1130  BP: 126/60  Pulse: 60  Weight: 121 lb (54.9 kg)  Height: 5' (1.524 m)  PainSc: 0-No pain   Body mass index is  23.63 kg/m.   INR was 4.0 today  Activities of Daily Living In your present state of health, do you have any difficulty performing the following activities: 04/12/2016  Hearing? Y  Vision? Y  Difficulty concentrating or making decisions? N  Walking or climbing stairs? Y  Dressing or bathing? N  Doing errands, shopping? Y  Preparing Food and eating ? N  Using the Toilet? N  In the past six months, have you accidently leaked urine? N  Do you have problems with loss of bowel control? N  Managing your Medications? N  Managing your Finances? N  Housekeeping or managing your Housekeeping? Y  Some recent data might be hidden     Cardiac Risk Factors include: advanced age (>24men, >67 women);diabetes mellitus;dyslipidemia;microalbuminuria;sedentary lifestyle  Depression Screen PHQ 2/9 Scores 04/12/2016 03/17/2016 03/03/2016 01/05/2016  PHQ - 2 Score 0 0 0 0  PHQ- 9 Score - - - -     Fall Risk Fall Risk  04/12/2016 03/17/2016 03/03/2016 02/25/2016 01/05/2016  Falls in the past year? Yes Yes Yes Yes No  Number falls in past yr: 2 or more 1 1 1  -  Injury with Fall? Yes Yes Yes Yes -  Risk Factor Category  High Fall Risk - - - -  Risk for fall due to : History of fall(s);Impaired balance/gait;Impaired mobility;Impaired vision - - - -  Follow up Falls prevention discussed - - - -    Cognitive Function: MMSE - Mini Mental State Exam 04/12/2016 01/05/2015  Orientation to time 5 5  Orientation to Place 5 5  Registration 3 3  Attention/ Calculation 5 5  Recall 3 3  Language- name 2 objects 2 2  Language- repeat 1 1  Language- follow 3 step command 3 3  Language- read & follow direction 1 1  Write a sentence 1 1  Copy design 0 0  Copy design-comments difficulty seeing shapes due to decrased eye sight decreased eye sight  Total score 29 29     Immunizations and Health Maintenance Immunization History  Administered Date(s) Administered  . Influenza Split 04/01/2011, 03/12/2012  . Influenza Whole 03/31/2010  . Influenza,inj,Quad PF,36+ Mos 04/01/2013, 04/03/2014, 04/09/2015, 03/17/2016  . Pneumococcal Conjugate-13 01/03/2014   Health Maintenance Due  Topic Date Due  . OPHTHALMOLOGY EXAM  02/28/1938  . PNA vac Low Risk Adult (2 of 2 - PPSV23) 01/04/2015    Patient Care Team: Chipper Herb, MD as PCP - General (Family Medicine) Lorretta Harp, MD as PCP - Cardiology (Cardiology) Hayden Pedro, MD as Consulting Physician (Ophthalmology)  Indicate any recent Medical Services you may have received from other than [facility] providers in the past year (date may be approximate).    Assessment:    Annual Wellness Visit  supratherapeutic anticoagulation HOH bilaterally   Screening Tests Health Maintenance  Topic Date Due  . OPHTHALMOLOGY EXAM  02/28/1938  . PNA vac Low Risk Adult (2 of 2 - PPSV23) 01/04/2015  . ZOSTAVAX  10/15/2016 (Originally 02/27/1990)  . TETANUS/TDAP  10/15/2016 (Originally [birth year]27/1949)  . HEMOGLOBIN A1C  09/14/2016  . URINE MICROALBUMIN  12/29/2016  . FOOT EXAM  03/17/2017  . INFLUENZA VACCINE  Completed  . DEXA SCAN  Addressed        Plan:   During the course of the visit [name] was educated and counseled about the following appropriate screening and preventive services:   Vaccines to include Pneumoccal, Influenza, Td, Zostavax - only vaccines due are Zostavax which requires PA and  will look into this for patient.  Also due Boostrix / Tdap - cost is $45 - patient deferred due to cost.  Colorectal cancer screening - no longer required  Cardiovascular disease screening - sees Dr Gwenlyn Found regularly.  Last EKG 03/2015; Last ECHO 12/2014; Last carotid doppler 04/2015.   Lipids are at goal per labs 02/2016; BP is at goal today  Diabetes - last A1c was 7.0% - patient is continue current  regimen  Bone Denisty / Osteoporosis Screening - UTD, next due 03/2017  Mammogram - UTD, next due 05/2017 though patient declines to schedule repeat today  PAP - not required  Audiologist referral made for hearing assessment  Glaucoma screening / Diabetic Eye Exam - eye exam is scheduled for 04/22/16 with Dr Zigmund Daniel.  Nutrition counseling - continue to monitor CHO intake and eat a variety of vegetables and fruits, whole grains and lean proteins  Fall prevention discussed.    Advanced Directives - UTD, copy requested  Physical Activity - continue with chair exercises.  Patient declined PT referrals for balance assessment  RTC in 2 weeks to check INR  Anticoagulation Dose Instructions as of 04/12/2016      Dorene Grebe Tue Wed Thu Fri Sat   New Dose 2.5 mg 5 mg 2.5 mg 5 mg 2.5 mg 5 mg 5 mg    Description   No warfarin today - Wednesdays, October 18th.  Then decrease warfarin dose to 1/2 tablet sundays, tuesdays and thursdays.  Take 1 tablet all other days.       Patient Instructions (the written plan) were given to the patient.   Cherre Robins, PharmD   04/12/2016

## 2016-04-12 NOTE — Telephone Encounter (Signed)
Rx has been sent to the pharmacy electronically. ° °

## 2016-04-12 NOTE — Patient Instructions (Addendum)
Ms. [name] , Thank you for taking time to come for your Medicare Wellness Visit. I appreciate your ongoing commitment to your health goals. Please review the following plan we discussed and let me know if I can assist you in the future.   These are the goals we discussed:  Keep appointment with Dr Zigmund Daniel for diabetic eye exam  Continue to do chair exercises and stretching every day  Will send referral for audiologist (hearing specialist) evaluation  This is a list of the screening recommended for you and due dates:  Health Maintenance  Topic Date Due  . Eye exam for diabetics  02/28/1938  . Pneumonia vaccines (2 of 2 - PPSV23) Completed  . Shingles Vaccine  Due now  . Tetanus Vaccine  Due now - cost checked $45  . Hemoglobin A1C  09/14/2016  . Urine Protein Check  12/29/2016  . Complete foot exam   03/17/2017  . Flu Shot  Completed  . DEXA scan (bone density measurement)  Addressed  *Topic was postponed. The date shown is not the original due date.   Fall Prevention in the Home  Falls can cause injuries and can affect people from all age groups. There are many simple things that you can do to make your home safe and to help prevent falls. WHAT CAN I DO ON THE OUTSIDE OF MY HOME?  Regularly repair the edges of walkways and driveways and fix any cracks.  Remove high doorway thresholds.  Trim any shrubbery on the main path into your home.  Use bright outdoor lighting.  Clear walkways of debris and clutter, including tools and rocks.  Regularly check that handrails are securely fastened and in good repair. Both sides of any steps should have handrails.  Install guardrails along the edges of any raised decks or porches.  Have leaves, snow, and ice cleared regularly.  Use sand or salt on walkways during winter months.  In the garage, clean up any spills right away, including grease or oil spills. WHAT CAN I DO IN THE BATHROOM?  Use night lights.  Install grab bars by  the toilet and in the tub and shower. Do not use towel bars as grab bars.  Use non-skid mats or decals on the floor of the tub or shower.  If you need to sit down while you are in the shower, use a plastic, non-slip stool.Marland Kitchen  Keep the floor dry. Immediately clean up any water that spills on the floor.  Remove soap buildup in the tub or shower on a regular basis.  Attach bath mats securely with double-sided non-slip rug tape.  Remove throw rugs and other tripping hazards from the floor. WHAT CAN I DO IN THE BEDROOM?  Use night lights.  Make sure that a bedside light is easy to reach.  Do not use oversized bedding that drapes onto the floor.  Have a firm chair that has side arms to use for getting dressed.  Remove throw rugs and other tripping hazards from the floor. WHAT CAN I DO IN THE KITCHEN?   Clean up any spills right away.  Avoid walking on wet floors.  Place frequently used items in easy-to-reach places.  If you need to reach for something above you, use a sturdy step stool that has a grab bar.  Keep electrical cables out of the way.  Do not use floor polish or wax that makes floors slippery. If you have to use wax, make sure that it is non-skid floor wax.  Remove throw rugs and other tripping hazards from the floor. WHAT CAN I DO IN THE STAIRWAYS?  Do not leave any items on the stairs.  Make sure that there are handrails on both sides of the stairs. Fix handrails that are broken or loose. Make sure that handrails are as long as the stairways.  Check any carpeting to make sure that it is firmly attached to the stairs. Fix any carpet that is loose or worn.  Avoid having throw rugs at the top or bottom of stairways, or secure the rugs with carpet tape to prevent them from moving.  Make sure that you have a light switch at the top of the stairs and the bottom of the stairs. If you do not have them, have them installed. WHAT ARE SOME OTHER FALL PREVENTION  TIPS?  Wear closed-toe shoes that fit well and support your feet. Wear shoes that have rubber soles or low heels.  When you use a stepladder, make sure that it is completely opened and that the sides are firmly locked. Have someone hold the ladder while you are using it. Do not climb a closed stepladder.  Add color or contrast paint or tape to grab bars and handrails in your home. Place contrasting color strips on the first and last steps.  Use mobility aids as needed, such as canes, walkers, scooters, and crutches.  Turn on lights if it is dark. Replace any light bulbs that burn out.  Set up furniture so that there are clear paths. Keep the furniture in the same spot.  Fix any uneven floor surfaces.  Choose a carpet design that does not hide the edge of steps of a stairway.  Be aware of any and all pets.  Review your medicines with your healthcare provider. Some medicines can cause dizziness or changes in blood pressure, which increase your risk of falling. Talk with your health care provider about other ways that you can decrease your risk of falls. This may include working with a physical therapist or trainer to improve your strength, balance, and endurance.   This information is not intended to replace advice given to you by your health care provider. Make sure you discuss any questions you have with your health care provider.   Document Released: 06/03/2002 Document Revised: 10/28/2014 Document Reviewed: 07/18/2014 Elsevier Interactive Patient Education [employer].

## 2016-04-19 ENCOUNTER — Ambulatory Visit: Payer: Self-pay | Admitting: Pharmacist

## 2016-04-22 ENCOUNTER — Encounter (INDEPENDENT_AMBULATORY_CARE_PROVIDER_SITE_OTHER): Payer: Medicare Other | Admitting: Ophthalmology

## 2016-04-24 ENCOUNTER — Observation Stay (HOSPITAL_BASED_OUTPATIENT_CLINIC_OR_DEPARTMENT_OTHER)
Admission: EM | Admit: 2016-04-24 | Discharge: 2016-04-25 | Disposition: A | Discharge status: Home / Self Care | Payer: Medicare Other | Attending: Internal Medicine | Admitting: Internal Medicine

## 2016-04-24 ENCOUNTER — Emergency Department (HOSPITAL_BASED_OUTPATIENT_CLINIC_OR_DEPARTMENT_OTHER): Payer: Medicare Other

## 2016-04-24 ENCOUNTER — Encounter (HOSPITAL_BASED_OUTPATIENT_CLINIC_OR_DEPARTMENT_OTHER): Payer: Self-pay | Admitting: *Deleted

## 2016-04-24 DIAGNOSIS — Z9981 Dependence on supplemental oxygen: Secondary | ICD-10-CM | POA: Diagnosis not present

## 2016-04-24 DIAGNOSIS — Z808 Family history of malignant neoplasm of other organs or systems: Secondary | ICD-10-CM | POA: Insufficient documentation

## 2016-04-24 DIAGNOSIS — J439 Emphysema, unspecified: Secondary | ICD-10-CM | POA: Diagnosis not present

## 2016-04-24 DIAGNOSIS — M1389 Other specified arthritis, multiple sites: Secondary | ICD-10-CM | POA: Diagnosis not present

## 2016-04-24 DIAGNOSIS — K219 Gastro-esophageal reflux disease without esophagitis: Secondary | ICD-10-CM | POA: Diagnosis not present

## 2016-04-24 DIAGNOSIS — Z794 Long term (current) use of insulin: Secondary | ICD-10-CM | POA: Diagnosis not present

## 2016-04-24 DIAGNOSIS — I130 Hypertensive heart and chronic kidney disease with heart failure and stage 1 through stage 4 chronic kidney disease, or unspecified chronic kidney disease: Principal | ICD-10-CM | POA: Insufficient documentation

## 2016-04-24 DIAGNOSIS — J449 Chronic obstructive pulmonary disease, unspecified: Secondary | ICD-10-CM | POA: Diagnosis not present

## 2016-04-24 DIAGNOSIS — I252 Old myocardial infarction: Secondary | ICD-10-CM | POA: Diagnosis not present

## 2016-04-24 DIAGNOSIS — Z9861 Coronary angioplasty status: Secondary | ICD-10-CM

## 2016-04-24 DIAGNOSIS — I2729 Other secondary pulmonary hypertension: Secondary | ICD-10-CM | POA: Diagnosis not present

## 2016-04-24 DIAGNOSIS — G4730 Sleep apnea, unspecified: Secondary | ICD-10-CM | POA: Diagnosis not present

## 2016-04-24 DIAGNOSIS — Z8673 Personal history of transient ischemic attack (TIA), and cerebral infarction without residual deficits: Secondary | ICD-10-CM | POA: Insufficient documentation

## 2016-04-24 DIAGNOSIS — E1151 Type 2 diabetes mellitus with diabetic peripheral angiopathy without gangrene: Secondary | ICD-10-CM | POA: Insufficient documentation

## 2016-04-24 DIAGNOSIS — R7989 Other specified abnormal findings of blood chemistry: Secondary | ICD-10-CM

## 2016-04-24 DIAGNOSIS — I5023 Acute on chronic systolic (congestive) heart failure: Secondary | ICD-10-CM | POA: Diagnosis not present

## 2016-04-24 DIAGNOSIS — I2510 Atherosclerotic heart disease of native coronary artery without angina pectoris: Secondary | ICD-10-CM | POA: Diagnosis not present

## 2016-04-24 DIAGNOSIS — I4510 Unspecified right bundle-branch block: Secondary | ICD-10-CM | POA: Insufficient documentation

## 2016-04-24 DIAGNOSIS — Z87891 Personal history of nicotine dependence: Secondary | ICD-10-CM | POA: Insufficient documentation

## 2016-04-24 DIAGNOSIS — E785 Hyperlipidemia, unspecified: Secondary | ICD-10-CM | POA: Diagnosis not present

## 2016-04-24 DIAGNOSIS — E1122 Type 2 diabetes mellitus with diabetic chronic kidney disease: Secondary | ICD-10-CM | POA: Insufficient documentation

## 2016-04-24 DIAGNOSIS — R0602 Shortness of breath: Secondary | ICD-10-CM | POA: Diagnosis not present

## 2016-04-24 DIAGNOSIS — M8580 Other specified disorders of bone density and structure, unspecified site: Secondary | ICD-10-CM | POA: Diagnosis not present

## 2016-04-24 DIAGNOSIS — N183 Chronic kidney disease, stage 3 unspecified: Secondary | ICD-10-CM | POA: Diagnosis present

## 2016-04-24 DIAGNOSIS — R778 Other specified abnormalities of plasma proteins: Secondary | ICD-10-CM

## 2016-04-24 DIAGNOSIS — I5043 Acute on chronic combined systolic (congestive) and diastolic (congestive) heart failure: Secondary | ICD-10-CM | POA: Diagnosis not present

## 2016-04-24 DIAGNOSIS — F419 Anxiety disorder, unspecified: Secondary | ICD-10-CM | POA: Diagnosis not present

## 2016-04-24 DIAGNOSIS — Z7901 Long term (current) use of anticoagulants: Secondary | ICD-10-CM | POA: Diagnosis not present

## 2016-04-24 DIAGNOSIS — Z885 Allergy status to narcotic agent status: Secondary | ICD-10-CM | POA: Insufficient documentation

## 2016-04-24 DIAGNOSIS — I10 Essential (primary) hypertension: Secondary | ICD-10-CM | POA: Diagnosis present

## 2016-04-24 DIAGNOSIS — I480 Paroxysmal atrial fibrillation: Secondary | ICD-10-CM | POA: Diagnosis not present

## 2016-04-24 DIAGNOSIS — K047 Periapical abscess without sinus: Secondary | ICD-10-CM | POA: Insufficient documentation

## 2016-04-24 DIAGNOSIS — Z8249 Family history of ischemic heart disease and other diseases of the circulatory system: Secondary | ICD-10-CM | POA: Insufficient documentation

## 2016-04-24 DIAGNOSIS — I509 Heart failure, unspecified: Secondary | ICD-10-CM

## 2016-04-24 DIAGNOSIS — Z888 Allergy status to other drugs, medicaments and biological substances status: Secondary | ICD-10-CM | POA: Insufficient documentation

## 2016-04-24 DIAGNOSIS — I110 Hypertensive heart disease with heart failure: Secondary | ICD-10-CM | POA: Diagnosis not present

## 2016-04-24 LAB — CBC WITH DIFFERENTIAL/PLATELET
BASOS ABS: 0.1 10*3/uL (ref 0.0–0.1)
BASOS PCT: 1 %
EOS ABS: 0.2 10*3/uL (ref 0.0–0.7)
EOS PCT: 3 %
HCT: 35.3 % — ABNORMAL LOW (ref 36.0–46.0)
Hemoglobin: 10.8 g/dL — ABNORMAL LOW (ref 12.0–15.0)
Lymphocytes Relative: 16 %
Lymphs Abs: 1.1 10*3/uL (ref 0.7–4.0)
MCH: 30.9 pg (ref 26.0–34.0)
MCHC: 30.6 g/dL (ref 30.0–36.0)
MCV: 100.9 fL — ABNORMAL HIGH (ref 78.0–100.0)
MONO ABS: 0.8 10*3/uL (ref 0.1–1.0)
MONOS PCT: 12 %
Neutro Abs: 4.6 10*3/uL (ref 1.7–7.7)
Neutrophils Relative %: 68 %
PLATELETS: 254 10*3/uL (ref 150–400)
RBC: 3.5 MIL/uL — ABNORMAL LOW (ref 3.87–5.11)
RDW: 12.7 % (ref 11.5–15.5)
WBC: 6.7 10*3/uL (ref 4.0–10.5)

## 2016-04-24 LAB — COMPREHENSIVE METABOLIC PANEL
ALBUMIN: 2.9 g/dL — AB (ref 3.5–5.0)
ALT: 28 U/L (ref 14–54)
AST: 25 U/L (ref 15–41)
Alkaline Phosphatase: 83 U/L (ref 38–126)
Anion gap: 8 (ref 5–15)
BUN: 25 mg/dL — AB (ref 6–20)
CHLORIDE: 99 mmol/L — AB (ref 101–111)
CO2: 34 mmol/L — ABNORMAL HIGH (ref 22–32)
CREATININE: 1.34 mg/dL — AB (ref 0.44–1.00)
Calcium: 9.1 mg/dL (ref 8.9–10.3)
GFR calc Af Amer: 40 mL/min — ABNORMAL LOW (ref >60)
GFR, EST NON AFRICAN AMERICAN: 35 mL/min — AB (ref >60)
GLUCOSE: 164 mg/dL — AB (ref 65–99)
POTASSIUM: 4.2 mmol/L (ref 3.5–5.1)
Sodium: 141 mmol/L (ref 135–145)
Total Bilirubin: 0.6 mg/dL (ref 0.3–1.2)
Total Protein: 6.3 g/dL — ABNORMAL LOW (ref 6.5–8.1)

## 2016-04-24 LAB — PROTIME-INR
INR: 1.77
Prothrombin Time: 20.8 seconds — ABNORMAL HIGH (ref 11.4–15.2)

## 2016-04-24 LAB — GLUCOSE, CAPILLARY: Glucose-Capillary: 163 mg/dL — ABNORMAL HIGH (ref 65–99)

## 2016-04-24 LAB — TROPONIN I
TROPONIN I: 0.14 ng/mL — AB (ref <0.03)
Troponin I: 0.09 ng/mL (ref <0.03)

## 2016-04-24 LAB — BRAIN NATRIURETIC PEPTIDE: B NATRIURETIC PEPTIDE 5: 505.1 pg/mL — AB (ref 0.0–100.0)

## 2016-04-24 MED ORDER — FUROSEMIDE 10 MG/ML IJ SOLN
40.0000 mg | Freq: Two times a day (BID) | INTRAMUSCULAR | Status: DC
  Administered 2016-04-25: 40 mg via INTRAVENOUS
  Filled 2016-04-24: qty 4

## 2016-04-24 MED ORDER — INSULIN ASPART 100 UNIT/ML ~~LOC~~ SOLN: 0.0000 [IU] | Freq: Three times a day (TID) | SUBCUTANEOUS | Status: DC

## 2016-04-24 MED ORDER — SODIUM CHLORIDE 0.9% FLUSH
3.0000 mL | Freq: Two times a day (BID) | INTRAVENOUS | Status: DC
  Administered 2016-04-25 (×2): 3 mL via INTRAVENOUS

## 2016-04-24 MED ORDER — AMIODARONE HCL 100 MG PO TABS
100.0000 mg | ORAL_TABLET | Freq: Every day | ORAL | Status: DC
  Administered 2016-04-25: 100 mg via ORAL
  Filled 2016-04-24: qty 1

## 2016-04-24 MED ORDER — MOMETASONE FURO-FORMOTEROL FUM 200-5 MCG/ACT IN AERO
2.0000 | INHALATION_SPRAY | Freq: Two times a day (BID) | RESPIRATORY_TRACT | Status: DC
  Filled 2016-04-24: qty 8.8

## 2016-04-24 MED ORDER — WARFARIN - PHARMACIST DOSING INPATIENT: Freq: Every day | Status: DC

## 2016-04-24 MED ORDER — INSULIN ASPART 100 UNIT/ML ~~LOC~~ SOLN: 0.0000 [IU] | Freq: Every day | SUBCUTANEOUS | Status: DC

## 2016-04-24 MED ORDER — WARFARIN SODIUM 2.5 MG PO TABS
2.5000 mg | ORAL_TABLET | Freq: Once | ORAL | Status: AC
  Administered 2016-04-25: 2.5 mg via ORAL
  Filled 2016-04-24: qty 1

## 2016-04-24 MED ORDER — NIFEDIPINE ER OSMOTIC RELEASE 30 MG PO TB24
30.0000 mg | ORAL_TABLET | Freq: Every day | ORAL | Status: DC
  Administered 2016-04-25: 30 mg via ORAL
  Filled 2016-04-24: qty 1

## 2016-04-24 MED ORDER — ASPIRIN 81 MG PO CHEW
324.0000 mg | CHEWABLE_TABLET | Freq: Once | ORAL | Status: AC
  Administered 2016-04-24: 324 mg via ORAL
  Filled 2016-04-24: qty 4

## 2016-04-24 MED ORDER — PANTOPRAZOLE SODIUM 40 MG PO TBEC
40.0000 mg | DELAYED_RELEASE_TABLET | Freq: Every day | ORAL | Status: DC
  Administered 2016-04-25: 40 mg via ORAL
  Filled 2016-04-24: qty 1

## 2016-04-24 MED ORDER — METOPROLOL SUCCINATE ER 25 MG PO TB24
12.5000 mg | ORAL_TABLET | Freq: Every day | ORAL | Status: DC
  Administered 2016-04-25: 12.5 mg via ORAL
  Filled 2016-04-24: qty 1

## 2016-04-24 MED ORDER — FUROSEMIDE 10 MG/ML IJ SOLN
40.0000 mg | Freq: Once | INTRAMUSCULAR | Status: AC
  Administered 2016-04-24: 40 mg via INTRAVENOUS
  Filled 2016-04-24: qty 4

## 2016-04-24 MED ORDER — TRAMADOL HCL 50 MG PO TABS: 50.0000 mg | ORAL_TABLET | Freq: Four times a day (QID) | ORAL | Status: DC | PRN

## 2016-04-24 MED ORDER — POTASSIUM CHLORIDE CRYS ER 20 MEQ PO TBCR
20.0000 meq | EXTENDED_RELEASE_TABLET | Freq: Every day | ORAL | Status: DC
  Administered 2016-04-25: 20 meq via ORAL
  Filled 2016-04-24: qty 1

## 2016-04-24 MED ORDER — INSULIN DETEMIR 100 UNIT/ML ~~LOC~~ SOLN
8.0000 [IU] | Freq: Every day | SUBCUTANEOUS | Status: DC
  Administered 2016-04-25: 8 [IU] via SUBCUTANEOUS
  Filled 2016-04-24 (×2): qty 0.08

## 2016-04-24 MED ORDER — ASPIRIN EC 81 MG PO TBEC
81.0000 mg | DELAYED_RELEASE_TABLET | Freq: Every day | ORAL | Status: DC
  Administered 2016-04-25: 81 mg via ORAL
  Filled 2016-04-24: qty 1

## 2016-04-24 MED ORDER — DOCUSATE SODIUM 100 MG PO CAPS
100.0000 mg | ORAL_CAPSULE | Freq: Two times a day (BID) | ORAL | Status: DC
  Administered 2016-04-25 (×2): 100 mg via ORAL
  Filled 2016-04-24 (×2): qty 1

## 2016-04-24 MED ORDER — BUDESONIDE-FORMOTEROL FUMARATE 160-4.5 MCG/ACT IN AERO
1.0000 | INHALATION_SPRAY | Freq: Two times a day (BID) | RESPIRATORY_TRACT | Status: DC
  Filled 2016-04-24: qty 6

## 2016-04-24 MED ORDER — AMOXICILLIN 500 MG PO CAPS
500.0000 mg | ORAL_CAPSULE | Freq: Three times a day (TID) | ORAL | Status: DC
  Administered 2016-04-25 (×2): 500 mg via ORAL
  Filled 2016-04-24 (×2): qty 1

## 2016-04-24 MED ORDER — IPRATROPIUM-ALBUTEROL 0.5-2.5 (3) MG/3ML IN SOLN: 3.0000 mL | RESPIRATORY_TRACT | Status: DC | PRN

## 2016-04-24 MED ORDER — ONDANSETRON HCL 4 MG/2ML IJ SOLN: 4.0000 mg | Freq: Four times a day (QID) | INTRAMUSCULAR | Status: DC | PRN

## 2016-04-24 MED ORDER — ACETAMINOPHEN 325 MG PO TABS: 650.0000 mg | ORAL_TABLET | Freq: Four times a day (QID) | ORAL | Status: DC | PRN

## 2016-04-24 MED ORDER — NITROGLYCERIN 0.4 MG SL SUBL: 0.4000 mg | SUBLINGUAL_TABLET | SUBLINGUAL | Status: DC | PRN

## 2016-04-24 MED ORDER — ATORVASTATIN CALCIUM 20 MG PO TABS: 20.0000 mg | ORAL_TABLET | Freq: Every evening | ORAL | Status: DC

## 2016-04-24 MED ORDER — ALPRAZOLAM 0.25 MG PO TABS: 0.2500 mg | ORAL_TABLET | Freq: Two times a day (BID) | ORAL | Status: DC | PRN

## 2016-04-24 MED ORDER — ONDANSETRON HCL 4 MG PO TABS: 4.0000 mg | ORAL_TABLET | Freq: Four times a day (QID) | ORAL | Status: DC | PRN

## 2016-04-24 MED ORDER — ACETAMINOPHEN 650 MG RE SUPP: 650.0000 mg | Freq: Four times a day (QID) | RECTAL | Status: DC | PRN

## 2016-04-24 NOTE — Progress Notes (Signed)
ANTICOAGULATION CONSULT NOTE - Initial Consult  Pharmacy Consult for Warfarin Indication: atrial fibrillation and pulmonary embolus  Allergies  Allergen Reactions  . Atorvastatin Other (See Comments)     muscle aches  . Codeine Nausea And Vomiting  . Morphine Nausea And Vomiting  . Other Other (See Comments)    OPIATES cause nausea and vomiting  . Rosuvastatin Other (See Comments)    muscle aches at high doses, held as of 12/2010 due to aches  . Iohexol Other (See Comments)     Desc: unknown reaction; allergic to iodine and contrast     Patient Measurements: Height: 5' (152.4 cm) Weight: 119 lb 11.2 oz (54.3 kg) IBW/kg (Calculated) : 45.5  Vital Signs: Temp: 98.1 F (36.7 C) (10/29 2135) Temp Source: Oral (10/29 2135) BP: 140/69 (10/29 2135) Pulse Rate: 65 (10/29 2135)  Labs:  Recent Labs  04/24/16 1610 04/24/16 2010  HGB 10.8*  --   HCT 35.3*  --   PLT 254  --   LABPROT 20.8*  --   INR 1.77  --   CREATININE 1.34*  --   TROPONINI 0.09* 0.14*    Estimated Creatinine Clearance: 21.6 mL/min (by C-G formula based on SCr of 1.34 mg/dL (H)).   Medical History: Past Medical History:  Diagnosis Date  . Angina   . Anxiety   . Arthritis    "all over"  . Bleeds easily ([place])   . Bradycardia 08/24/2011  . CAD (coronary artery disease)    Cath in 2006 showed an occluded RCA with left-to-right collaterals & otherwise noncritical CAD with EF=25-30% at that time. EF subsequently improved to normal by 2D ECHO. Cath Aug. 2011 showed unchanged anatomy. > medical therapy recommended  . Carotid artery disease (HCC)    Moderate, right greater than left which we following by duplex ultrasound. [place] 12/05/11 = RIght Bulb/Prox ICA: Moderate to severe amt of fibrous plaque elevating velocities w/in prox segment of ICA. Consistent w/a 50-69% diameter reduction. Left Bulb/Prox ICA: Moderate amt of fibrous plaque slightly elevating velocities w/in the prox segment of the ICA. Consistent  w/a 0-49% diameter reduction.   . Carotid bruit   . CHF (congestive heart failure) ([place])    Hospitalized 08/22/11-08/26/11 with CHF secondary to diastolic dysfunction & hospitilized in 07/2012 with a similar problem. TTE 08/23/11 = normal EF.  Marland Kitchen Chronic lower back pain   . COPD (chronic obstructive pulmonary disease) (HCC)    GOLD 4 - PFT 06/08/10 FEV1 0.93 (62%), FEV1% 60, TLC 2.84 (69%0, DLCO 54%, +BD) On home O2.  Marland Kitchen COPD with emphysema ([place])   . Coronary atherosclerosis of unspecified type of vessel, native or graft   . Diastolic dysfunction    Grade II  . Edema   . Fall during current hospitalization    "was at [facility]; fell; transferred to [facility]" (01/13/2015)  . Family history of adverse reaction to anesthesia    "daughter gets PONV"  . GERD (gastroesophageal reflux disease)   . History of stress test 07/02/09   Mild perfusion defect seen in the Basal Inferior region(s). This is consistent with an infarct/scar.  Post-stress EF=56%. Global LV systolic function is normal.  EKG is negative for ischemia.  No significant iscemia detected. Low risk scan.  . Hyperlipidemia   . Hypertension   . Hypoxemic respiratory failure, chronic (HCC)    uses 2.5 liters of oxygen with sleep  . Lower extremity edema    ECHO 07/21/12 = EF 55-60%, performed for TIA. Responding to diurectics.  Continue diuresis w/ a goal dry weight of <160lb. Venous Duplex 07/27/11 = Right lower extremity: no evidence of thrombus or thrombophlebitis. Essentially normal right lower extremity venous duplex Doppler evaluation.  . Mitral valve regurgitation    Mild to moderate by ECHO 07/21/12  . Myocardial infarction    "she's had several" (01/13/2015)  . On home oxygen therapy    "?L; prn" (01/13/2015)  . PAF (paroxysmal atrial fibrillation) (HCC)    In the past, on coumadin  . Pneumonia ~ 2013  . PONV (postoperative nausea and vomiting)   . RBBB (right bundle branch block)    Chronic  . Secondary pulmonary hypertension   .  Shortness of breath   . Sleep apnea   . Sleep-related hypoventilation   . Stroke [facility])    "/CT scan; ~ 2014; didn't even know she'd had it"  (01/13/2015)  . Type II diabetes mellitus (HCC)    goal A1C is 8, to avoid hypoglycemia    Assessment: 49 YOF with hx CHF on warfarin PTA for hx Afib who presented on 10/29 with SOB concerning for CHF exacerbation. Admit INR 1.77 on PTA dose of 5 mg daily EXCEPT for 2.5 mg on Sun/Tues/Thurs (dose stated per patient). The patient and family state that she has already taken her dose of 2.5 mg for today. Since the patient's INR was SUBtherapeutic - will supplement another small 2.5 mg dose this evening for a total daily dose today of 5 mg.   Goal of Therapy:  INR 2-3 Monitor platelets by anticoagulation protocol: Yes   Plan:  1. Warfarin 2.5 mg x 1 dose at 1800 today (in addition to 2.5 mg dose taken by patient this AM to total 5 mg dose today) 2. Daily INR 3. Will continue to monitor for any signs/symptoms of bleeding and will follow up with PT/INR in the a.m.   Thank you for allowing pharmacy to be a part of this patient's care.  Alycia Rossetti, PharmD, BCPS Clinical Pharmacist Pager: [phone] 04/24/2016 9:51 PM

## 2016-04-24 NOTE — H&P (Signed)
History and Physical    Jayleanna Pruitt [id number] DOB: July 04, 1927 DOA: 04/24/2016  PCP: Redge Gainer, MD  Pulmonary: [place] Cardiology: Gwenlyn Found  Patient coming from: [facility]  Chief Complaint: Shortness of breath  HPI: [name] is a 82 y.o. woman with a history of CAD, combined systolic and diastolic heart failure (last EF 45-50% in July 2016), paroxysmal atrial fibrillation (CHADS-Vasc score of 9, anticoagulated with warfarin), HTN, DM, and COPD (on home oxygen at baseline, 3L [place] "as needed") who presented to the ED in [facility] for evaluation of progressive DOE associated with chest tightness, orthopnea, and PND for the past three days.  She reports a 4 lb weight gain.  She has increased swelling in her ankles.  She has had light-headedness but no syncope.  No recent falls.  She has had subacute nausea (has been evaluated by her PCP) but no vomiting or diarrhea.  She has intermittent sweats but no fever.  She was recently diagnosed with a tooth abscess and has been on oral amoxicillin.    She admits that she does not really follow dietary restrictions.  ED Course: Chest xray shows cardiomegaly without overt pulmonary edema.  EKG shows atrial fibrillation with RBBB.  BNP elevated to 505.  Troponin mildly elevated to 0.09, repeat 0.14.  She has mildly elevated BUN and creatinine, but history of CKD.  She received full strength aspirin and lasix 40mg  IV one time in the ED.  Hospitalist asked to admit for acute CHF exacerbation.  Review of Systems: As per HPI otherwise 10 point review of systems negative.    Past Medical History:  Diagnosis Date  . Angina   . Anxiety   . Arthritis    "all over"  . Bleeds easily ([place])   . Bradycardia 08/24/2011  . CAD (coronary artery disease)    Cath in 2006 showed an occluded RCA with left-to-right collaterals & otherwise noncritical CAD with EF=25-30% at that time. EF subsequently improved to normal by 2D ECHO. Cath Aug. 2011 showed unchanged  anatomy. > medical therapy recommended  . Carotid artery disease (HCC)    Moderate, right greater than left which we following by duplex ultrasound. [place] 12/05/11 = RIght Bulb/Prox ICA: Moderate to severe amt of fibrous plaque elevating velocities w/in prox segment of ICA. Consistent w/a 50-69% diameter reduction. Left Bulb/Prox ICA: Moderate amt of fibrous plaque slightly elevating velocities w/in the prox segment of the ICA. Consistent w/a 0-49% diameter reduction.   . Carotid bruit   . CHF (congestive heart failure) ([place])    Hospitalized 08/22/11-08/26/11 with CHF secondary to diastolic dysfunction & hospitilized in 07/2012 with a similar problem. TTE 08/23/11 = normal EF.  Marland Kitchen Chronic lower back pain   . COPD (chronic obstructive pulmonary disease) (HCC)    GOLD 4 - PFT 06/08/10 FEV1 0.93 (62%), FEV1% 60, TLC 2.84 (69%0, DLCO 54%, +BD) On home O2.  Marland Kitchen COPD with emphysema ([place])   . Coronary atherosclerosis of unspecified type of vessel, native or graft   . Diastolic dysfunction    Grade II  . Edema   . Fall during current hospitalization    "was at [facility]; fell; transferred to [facility]" (01/13/2015)  . Family history of adverse reaction to anesthesia    "daughter gets PONV"  . GERD (gastroesophageal reflux disease)   . History of stress test 07/02/09   Mild perfusion defect seen in the Basal Inferior region(s). This is consistent with an infarct/scar.  Post-stress EF=56%. Global LV systolic function is normal.  EKG is negative for ischemia.  No significant iscemia detected. Low risk scan.  . Hyperlipidemia   . Hypertension   . Hypoxemic respiratory failure, chronic (HCC)    uses 2.5 liters of oxygen with sleep  . Lower extremity edema    ECHO 07/21/12 = EF 55-60%, performed for TIA. Responding to diurectics. Continue diuresis w/ a goal dry weight of <160lb. Venous Duplex 07/27/11 = Right lower extremity: no evidence of thrombus or thrombophlebitis. Essentially normal right lower extremity venous duplex  Doppler evaluation.  . Mitral valve regurgitation    Mild to moderate by ECHO 07/21/12  . Myocardial infarction    "she's had several" (01/13/2015)  . On home oxygen therapy    "?L; prn" (01/13/2015)  . PAF (paroxysmal atrial fibrillation) (HCC)    In the past, on coumadin  . Pneumonia ~ 2013  . PONV (postoperative nausea and vomiting)   . RBBB (right bundle branch block)    Chronic  . Secondary pulmonary hypertension   . Shortness of breath   . Sleep apnea   . Sleep-related hypoventilation   . Stroke [facility])    "/CT scan; ~ 2014; didn't even know she'd had it"  (01/13/2015)  . Type II diabetes mellitus (HCC)    goal A1C is 8, to avoid hypoglycemia    Past Surgical History:  Procedure Laterality Date  . CARDIAC CATHETERIZATION  2006 & 2011   Cath in 2006 showed an occluded RCA with left-to-right collaterals & otherwise noncritical CAD with EF=25-30% at that time. EF subsequently improved to normal by 2D ECHO. Cath Aug. 2011 showed unchanged anatomy. > medical therapy recommended.  . Carotid Duplex  12/05/11   Moderate, right greater than left which we following by duplex ultrasound. [place] 12/05/11 = RIght Bulb/Prox ICA: Moderate to severe amt of fibrous plaque elevating velocities w/in prox segment of ICA. Consistent w/a 50-69% diameter reduction. Left Bulb/Prox ICA: Moderate amt of fibrous plaque slightly elevating velocities w/in the prox segment of the ICA. Consistent w/a 0-49% diameter reduction.   Marland Kitchen CATARACT EXTRACTION W/ INTRAOCULAR LENS  IMPLANT, BILATERAL Bilateral   . CORONARY ANGIOPLASTY    . CORONARY ANGIOPLASTY WITH STENT PLACEMENT     x2  . FEMUR IM NAIL Left 01/15/2015   Procedure: INTRAMEDULLARY (IM) NAIL FEMORAL LEFT ;  Surgeon: Netta Cedars, MD;  Location: [place];  Service: Orthopedics;  Laterality: Left;  . SHOULDER OPEN ROTATOR CUFF REPAIR Right 07/2005  . TONSILLECTOMY    . TOTAL ABDOMINAL HYSTERECTOMY  ~ 1967  . TRANSVAGINAL TAPE (TVT) REMOVAL    . Venous Duplex   07/27/11   Venous Duplex 07/27/11 = Right lower extremity: no evidence of thrombus or thrombophlebitis. Essentially normal right lower extremity venous duplex Doppler evaluation.     reports that she has quit smoking. Her smoking use included Cigarettes. She has a 22.50 pack-year smoking history. She has never used smokeless tobacco. She reports that she drinks about 3.6 oz of alcohol per week . She reports that she does not use drugs.  She is a widow.  She lives independently.  She has two adult children.  Her daughter and daughter-in-law share medical POA.  Allergies  Allergen Reactions  . Atorvastatin Other (See Comments)     muscle aches  . Codeine Nausea And Vomiting  . Morphine Nausea And Vomiting  . Other Other (See Comments)    OPIATES cause nausea and vomiting  . Rosuvastatin Other (See Comments)    muscle aches at high doses, held  as of 12/2010 due to aches  . Iohexol Other (See Comments)     Desc: unknown reaction; allergic to iodine and contrast     Family History  Problem Relation Age of Onset  . Cancer Mother     Skin  . Heart disease Mother   . Emphysema Father   . Heart disease Father   . Congestive Heart Failure Father   . Heart disease Brother   . Heart disease Brother   . Early death Sister      Prior to Admission medications   Medication Sig Start Date End Date Taking? Authorizing Provider  amoxicillin (AMOXIL) 500 MG capsule Take 500 mg by mouth 3 (three) times daily.   Yes Historical Provider, MD  ACCU-CHEK AVIVA PLUS test strip Test tid. Dx E11.9 11/27/15   Chipper Herb, MD  acetaminophen (TYLENOL) 500 MG tablet Take 500 mg by mouth every 4 (four) hours as needed.    Historical Provider, MD  ALPRAZolam (XANAX) 0.25 MG tablet TAKE 1/2 TO 1 TABLET 2 TIMES A DAY AS NEEDED FOR ANXIETY 07/03/15   Sharion Balloon, FNP  amiodarone (PACERONE) 200 MG tablet Take 0.5 tablets (100 mg total) by mouth daily. Need appointment before anymore refills 04/12/16   Lorretta Harp, MD  atorvastatin (LIPITOR) 20 MG tablet TAKE 1 TABLET DAILY IN THE EVENING 11/30/15   Chipper Herb, MD  Azelastine HCl 0.15 % SOLN Place 1 spray into both nostrils 2 (two) times daily as needed (congestion).    Historical Provider, MD  baclofen (LIORESAL) 10 MG tablet TAKE 1/2 TABLET EVERY 8 HOURS AS NEEDED FOR MUSCLE SPASM 12/31/15   Chipper Herb, MD  budesonide-formoterol [facility]) 160-4.5 MCG/ACT inhaler Inhale 1 puff into the lungs 2 (two) times daily. 01/24/14   Chipper Herb, MD  cholecalciferol (VITAMIN D) 1000 units tablet Take 1,000 Units by mouth daily.    Historical Provider, MD  docusate sodium (COLACE) 100 MG capsule Take 100 mg by mouth 2 (two) times daily.    Historical Provider, MD  ferrous sulfate 325 (65 FE) MG tablet TAKE 1 TABLET ONCE DAILY WITH BREAKFAST 02/22/16   Chipper Herb, MD  fluticasone [facility]) 50 MCG/ACT nasal spray Place 1 spray into both nostrils 2 (two) times daily. 12/24/13   Chesley Mires, MD  furosemide (LASIX) 40 MG tablet TAKE  (1)  TABLET TWICE A DAY. 04/04/16   Chipper Herb, MD  glucosamine-chondroitin 500-400 MG tablet Take 1 tablet by mouth 2 (two) times daily.    Historical Provider, MD  HUMALOG KWIKPEN 100 UNIT/ML KiwkPen INJECT UP TO 8 UNITS BEFORE MEALS AS DIRECTED BY SLIDING SCALE. MAX 24 UNITS PER DAY 04/07/15   Fransisca Kaufmann Dettinger, MD  hydrocortisone (ANUSOL-HC) 25 MG suppository Place 1 suppository (25 mg total) rectally 2 (two) times daily as needed for hemorrhoids or itching. 07/30/15   Fransisca Kaufmann Dettinger, MD  Insulin Detemir (LEVEMIR FLEXTOUCH) 100 UNIT/ML Pen Inject 8 Units into the skin. INJECT 7 UNITS SQ EVERY MORNING 04/22/15   Cherre Robins, PharmD  Insulin Pen Needle (PEN NEEDLES) 31G X 6 MM MISC Use to administer insulin up to qid 05/25/15   Chipper Herb, MD  Ipratropium-Albuterol (COMBIVENT) 20-100 MCG/ACT AERS respimat Inhale 1 puff into the lungs every 6 (six) hours as needed for wheezing or shortness of breath. 01/24/14   Chipper Herb, MD  metoprolol succinate (TOPROL-XL) 25 MG 24 hr tablet TAKE (1/2) TABLET DAILY. 12/08/15  Chipper Herb, MD  Multiple Vitamins-Minerals (PRESERVISION/LUTEIN) CAPS Take 1 capsule by mouth daily.     Historical Provider, MD  NIFEDICAL XL 30 MG 24 hr tablet Take 1 tablet (30 mg total) by mouth daily. 05/18/15   Lorretta Harp, MD  nitroGLYCERIN (NITROSTAT) 0.4 MG SL tablet Place 1 tablet (0.4 mg total) under the tongue every 5 (five) minutes as needed for chest pain. 05/09/14   Mary-Margaret Hassell Done, FNP  pantoprazole (PROTONIX) 40 MG tablet Take 1 tablet (40 mg total) by mouth daily. 08/12/15   Chipper Herb, MD  polyethylene glycol powder (GLYCOLAX/MIRALAX) powder Take 17 g by mouth daily as needed. 07/30/15   Fransisca Kaufmann Dettinger, MD  potassium chloride SA (K-DUR,KLOR-CON) 20 MEQ tablet TAKE 1 TABLET DAILY 02/22/16   Chipper Herb, MD  PRODIGY TWIST TOP LANCETS 28G MISC CHECK BLOOD SUGAR UP TO 4 TIMES A DAY 05/19/15   Chipper Herb, MD  simethicone (MYLICON) 80 MG chewable tablet Chew 80 mg by mouth at bedtime as needed for flatulence.    Historical Provider, MD  traMADol (ULTRAM) 50 MG tablet Take 1 tablet (50 mg total) by mouth every 6 (six) hours as needed for severe pain. 12/26/15   Julianne Rice, MD  vitamin C (ASCORBIC ACID) 500 MG tablet Take 500 mg by mouth at bedtime.     Historical Provider, MD  warfarin (COUMADIN) 5 MG tablet Take 1/2 tablet on mondays and fridays, take 1 tablet all other days 01/21/16   Cherre Robins, PharmD    Physical Exam: Vitals:   04/24/16 1723 04/24/16 1852 04/24/16 1930 04/24/16 2000  BP: 134/66 121/85 130/68 140/70  Pulse: 74 76 73 64  Resp: 17 16 12 16   Temp:      TempSrc:      SpO2: 95% 96% 97% (!) 89%  Weight:          Constitutional: NAD, calm, comfortable, sitting on the edge of the bed Vitals:   04/24/16 1723 04/24/16 1852 04/24/16 1930 04/24/16 2000  BP: 134/66 121/85 130/68 140/70  Pulse: 74 76 73 64  Resp: 17 16 12 16   Temp:        TempSrc:      SpO2: 95% 96% 97% (!) 89%  Weight:       Eyes: PERRL, lids and conjunctivae normal ENMT: Mucous membranes are moist. Posterior pharynx clear of any exudate or lesions. Missing several teeth. Neck: normal appearance, supple Respiratory: clear to auscultation bilaterally, no wheezing, no crackles. Normal respiratory effort. No accessory muscle use.  Cardiovascular: Irregular but rate controlled.  1+ pitting edema at bilateral ankles.  2+ pedal pulses. + left carotid bruit.    GI: abdomen is soft and compressible.  No distention.  No tenderness.  Bowel sounds are present. Musculoskeletal:  No joint deformity in upper and lower extremities. Good ROM, no contractures. Normal muscle tone.  Skin: no rashes, warm and dry Neurologic: No focal deficits. Psychiatric: Normal judgment and insight. Alert and oriented x 3. Normal mood.     Labs on Admission: I have personally reviewed following labs and imaging studies  CBC:  Recent Labs Lab 04/24/16 1610  WBC 6.7  NEUTROABS 4.6  HGB 10.8*  HCT 35.3*  MCV 100.9*  PLT 0000000   Basic Metabolic Panel:  Recent Labs Lab 04/24/16 1610  NA 141  K 4.2  CL 99*  CO2 34*  GLUCOSE 164*  BUN 25*  CREATININE 1.34*  CALCIUM 9.1   GFR: Estimated Creatinine  Clearance: 23.5 mL/min (by C-G formula based on SCr of 1.34 mg/dL (H)). Liver Function Tests:  Recent Labs Lab 04/24/16 1610  AST 25  ALT 28  ALKPHOS 83  BILITOT 0.6  PROT 6.3*  ALBUMIN 2.9*   Coagulation Profile:  Recent Labs Lab 04/24/16 1610  INR 1.77   Cardiac Enzymes:  Recent Labs Lab 04/24/16 1610 04/24/16 2010  TROPONINI 0.09* 0.14*   Urine analysis:    Component Value Date/Time   COLORURINE YELLOW 04/06/2015 1929   APPEARANCEUR Clear 03/17/2016 1210   LABSPEC 1.012 04/06/2015 1929   PHURINE 6.0 04/06/2015 1929   GLUCOSEU Negative 03/17/2016 1210   HGBUR NEGATIVE 04/06/2015 1929   BILIRUBINUR Negative 03/17/2016 1210   KETONESUR NEGATIVE  04/06/2015 1929   PROTEINUR 1+ (A) 03/17/2016 1210   PROTEINUR 30 (A) 04/06/2015 1929   UROBILINOGEN negative 05/20/2015 0949   UROBILINOGEN 0.2 04/06/2015 1929   NITRITE Negative 03/17/2016 1210   NITRITE NEGATIVE 04/06/2015 1929   LEUKOCYTESUR Trace (A) 03/17/2016 1210    Radiological Exams on Admission: Dg Chest 2 View  Result Date: 04/24/2016 CLINICAL DATA:  Shortness of breath and CHF EXAM: CHEST  2 VIEW COMPARISON:  12/26/2015 chest radiograph. FINDINGS: Stable cardiomediastinal silhouette with moderate cardiomegaly and aortic atherosclerosis. No pneumothorax. No pleural effusion. Hyperinflated lungs. No overt pulmonary edema. No acute consolidative airspace disease. Deformities in the right posterior seventh through ninth ribs, not definitely seen on 12/26/2015 chest radiograph, possibly subacute fractures. IMPRESSION: 1. Stable cardiomegaly without overt pulmonary edema. 2. Hyperinflated lungs, suggesting COPD . 3. Deformities in the right posterior seventh through ninth ribs, not definitely seen on 12/26/2015 chest radiograph, possibly subacute fractures. No pneumothorax. No acute pulmonary disease . 4. Aortic atherosclerosis. Electronically Signed   By: Ilona Sorrel M.D.   On: 04/24/2016 14:54    EKG: Independently reviewed. Noted above.  Assessment/Plan Principal Problem:   CHF exacerbation (HCC) Active Problems:   PAF, recurrance this admission, on chronic Amio/ Coumadin   COPD with emphysema (HCC)   HTN (hypertension)   CAD, remote RCA PCI, subsequently noted to be occluded, last cath 8/11   DM type 2 causing CKD stage 3 (HCC)   CHF (congestive heart failure) (HCC)      Acute exacerbation of chronic diastolic and systolic heart failure --Diuresis with IV lasix --Strict I/O --Daily weights --Serial troponin --1500 cc fluid restriction --Complete echo in the AM  Elevated troponin, demand ischemia in the setting of acute CHF suspected --Follow troponin --continue  baby aspirin for now.  On BB and statin at baseline.  No ACE/ARB with renal insufficiency. --Complete echo in the AM --Will need to call cardiology back in the AM if full consult is needed  PAF, anticoagulated with warfarin, INR mildly subtherapeutic at 1.77 --Pharmacy to dose warfarin --Continue amiodarone, toprol  DM, level of control unknown --Check A1c --Continue home dose of levemir --SSI AC/HS  History of COPD, compensated --On baseline O2 requirement of 3L --Continue long-acting inhaler --Duonebs prn  HTN --Continue nifedipine, toprol  CKD 3 --Monitor BUN/Cr with diuresis --Avoid nephrotoxic agents  Recent tooth abscess --Continue oral amoxicillin     DVT prophylaxis: Anticoagulated with warfarin Code Status: FULL Family Communication: Daughter and daughter-in-law present at bedside at time of admission. Disposition Plan: Patient is hoping to go home tomorrow. Consults called: NONE.  Will need to call cardiology (again) in the AM if formal consultation is needed.  Patient known to Dr. Gwenlyn Found. Admission status: Observation, telemetry   TIME SPENT: 70  minutes   Eber Jones MD [facility] Pager [phone]  If 7PM-7AM, please contact night-coverage [email or web address] Password [email or web address]  04/24/2016, 9:35 PM

## 2016-04-24 NOTE — Progress Notes (Signed)
82 yo woman with a history of CAD, CHF, HTN, DM, a fib (anticoagulated with warfarin) accepted to observation status with telemetry monitoring for CHF exacerbation.  4 lb weight gain, increased O2 requirement, increased DOE, and lower extremity edema.  Mild troponin leak to 0.09.  Should received lasix 40mg  IV one time prior to transfer.  Case discussed with Dr. Jeffie Pollock; Cardiology can be consulted in the AM if needed.

## 2016-04-24 NOTE — ED Notes (Signed)
Pt states she has been having SOB for past two days, pt denies chest pain, pt denies lightheaded and dizziness, pt states she just feels weak, pt uses oxygen at home 2 to 3 liters. Pt denies numbness and tingling.

## 2016-04-24 NOTE — ED Notes (Signed)
carelink here, no changes, VSS, 88-93% 3L [place], afib 66, sBP140, pt alert, NAD< calm, interactive, talkative, HOH, family at [facility].

## 2016-04-24 NOTE — ED Triage Notes (Signed)
Pt c/o SOb x 3 days and and lower leg swelling hx CHF

## 2016-04-24 NOTE — ED Provider Notes (Signed)
[facility] DEPT [facility] Provider Note   CSN: [id number] Arrival date & time: 04/24/16  1411  By signing my name below, I, Neta Mends, attest that this documentation has been prepared under the direction and in the presence of Duffy Bruce, MD . Electronically Signed: Neta Mends, ED Scribe. 04/24/2016. 3:22 PM.   History   Chief Complaint Chief Complaint  Patient presents with  . Shortness of Breath    The history is provided by the patient. No language interpreter was used.   HPI Comments:  [name] is a 82 y.o. female with PMHx of CHF, COPD and CAD who presents to the Emergency Department complaining of intermittent SOB x 3 days. Pt states that 1 month ago, she had a flu shot, and has been having worsening symptoms for the last 3 days that she attributes to having the flu shot. Pt states that she becomes short of breath upon minor exertion, such as when making her bed. Pt complains of associated diaphoresis, sore throat, and ankle swelling. Pt's daughter states that normally the pt has very thin ankles. Pt just finished a course of amoxicillin for a tooth abscess. Pt notes that she had a different tooth fall out this morning. Pt states that she is adhering to her PCP's advice. Pt reports a recent weight change from 162 lbs to 119 lbs, and that her weight usually fluctuates between 119 lbs and 122 lbs. Pt uses NCO2 at home, as needed. Pt currently lives alone. No alleviating factors noted. Pt denies fever, chest pain.    Past Medical History:  Diagnosis Date  . Angina   . Anxiety   . Arthritis    "all over"  . Bleeds easily ([place])   . Bradycardia 08/24/2011  . CAD (coronary artery disease)    Cath in 2006 showed an occluded RCA with left-to-right collaterals & otherwise noncritical CAD with EF=25-30% at that time. EF subsequently improved to normal by 2D ECHO. Cath Aug. 2011 showed unchanged anatomy. > medical therapy recommended  . Carotid artery disease  (HCC)    Moderate, right greater than left which we following by duplex ultrasound. [place] 12/05/11 = RIght Bulb/Prox ICA: Moderate to severe amt of fibrous plaque elevating velocities w/in prox segment of ICA. Consistent w/a 50-69% diameter reduction. Left Bulb/Prox ICA: Moderate amt of fibrous plaque slightly elevating velocities w/in the prox segment of the ICA. Consistent w/a 0-49% diameter reduction.   . Carotid bruit   . CHF (congestive heart failure) ([place])    Hospitalized 08/22/11-08/26/11 with CHF secondary to diastolic dysfunction & hospitilized in 07/2012 with a similar problem. TTE 08/23/11 = normal EF.  Marland Kitchen Chronic lower back pain   . COPD (chronic obstructive pulmonary disease) (HCC)    GOLD 4 - PFT 06/08/10 FEV1 0.93 (62%), FEV1% 60, TLC 2.84 (69%0, DLCO 54%, +BD) On home O2.  Marland Kitchen COPD with emphysema ([place])   . Coronary atherosclerosis of unspecified type of vessel, native or graft   . Diastolic dysfunction    Grade II  . Edema   . Fall during current hospitalization    "was at [facility]; fell; transferred to [facility]" (01/13/2015)  . Family history of adverse reaction to anesthesia    "daughter gets PONV"  . GERD (gastroesophageal reflux disease)   . History of stress test 07/02/09   Mild perfusion defect seen in the Basal Inferior region(s). This is consistent with an infarct/scar.  Post-stress EF=56%. Global LV systolic function is normal.  EKG is negative for ischemia.  No significant iscemia detected. Low risk scan.  . Hyperlipidemia   . Hypertension   . Hypoxemic respiratory failure, chronic (HCC)    uses 2.5 liters of oxygen with sleep  . Lower extremity edema    ECHO 07/21/12 = EF 55-60%, performed for TIA. Responding to diurectics. Continue diuresis w/ a goal dry weight of <160lb. Venous Duplex 07/27/11 = Right lower extremity: no evidence of thrombus or thrombophlebitis. Essentially normal right lower extremity venous duplex Doppler evaluation.  . Mitral valve regurgitation    Mild to  moderate by ECHO 07/21/12  . Myocardial infarction    "she's had several" (01/13/2015)  . On home oxygen therapy    "?L; prn" (01/13/2015)  . PAF (paroxysmal atrial fibrillation) (HCC)    In the past, on coumadin  . Pneumonia ~ 2013  . PONV (postoperative nausea and vomiting)   . RBBB (right bundle branch block)    Chronic  . Secondary pulmonary hypertension   . Shortness of breath   . Sleep apnea   . Sleep-related hypoventilation   . Stroke [facility])    "/CT scan; ~ 2014; didn't even know she'd had it"  (01/13/2015)  . Type II diabetes mellitus (HCC)    goal A1C is 8, to avoid hypoglycemia    Patient Active Problem List   Diagnosis Date Noted  . Atherosclerotic peripheral vascular disease ([place]) 11/04/2015  . Intertrochanteric fracture of left hip ([place])   . Neck pain   . Postoperative anemia due to acute blood loss   . Hip fracture requiring operative repair ([place]) 01/15/2015  . Preop cardiovascular exam 01/14/2015  . Hip fracture ([place]) 01/13/2015  . Osteopenia 01/05/2015  . Primary osteoarthritis involving multiple joints 06/12/2014  . CHF (congestive heart failure) ([name]) 12/28/2013  . Atypical chest pain 12/18/2013  . Upper airway cough syndrome 05/20/2013  . Pain in joint, ankle and foot 12/06/2012  . Olecranon bursitis of left elbow 08/26/2012  . Scalp lesion 08/26/2012  . Counseling regarding advanced directives 08/26/2012  . DM type 2 causing CKD stage 3 ([place]) 07/30/2012  . Sacral fracture, closed ([place]) 07/22/2012  . Ataxia 07/20/2012  . Weakness of both legs 07/20/2012  . Falls frequently 07/20/2012  . RBBB 08/25/2011  . CAD, remote RCA PCI, subsequently noted to be occluded, last cath 8/11 08/25/2011  . PVD (peripheral vascular disease), moderate carotid and LSCA disease 08/25/2011  . HTN (hypertension) 08/22/2011  . AK (actinic keratosis) 07/17/2011  . Shoulder pain 04/03/2011  . Gait instability 10/11/2010  . PAF, recurrance this admission, on chronic Amio/  Coumadin 07/06/2010  . COPD with emphysema ([place]) 06/16/2010  . Chronic respiratory failure with hypoxia ([place]) 06/07/2010  . Hyperlipidemia LDL goal <70 02/11/2010    Past Surgical History:  Procedure Laterality Date  . CARDIAC CATHETERIZATION  2006 & 2011   Cath in 2006 showed an occluded RCA with left-to-right collaterals & otherwise noncritical CAD with EF=25-30% at that time. EF subsequently improved to normal by 2D ECHO. Cath Aug. 2011 showed unchanged anatomy. > medical therapy recommended.  . Carotid Duplex  12/05/11   Moderate, right greater than left which we following by duplex ultrasound. [place] 12/05/11 = RIght Bulb/Prox ICA: Moderate to severe amt of fibrous plaque elevating velocities w/in prox segment of ICA. Consistent w/a 50-69% diameter reduction. Left Bulb/Prox ICA: Moderate amt of fibrous plaque slightly elevating velocities w/in the prox segment of the ICA. Consistent w/a 0-49% diameter reduction.   Marland Kitchen CATARACT EXTRACTION W/ INTRAOCULAR LENS  IMPLANT, BILATERAL  Bilateral   . CORONARY ANGIOPLASTY    . CORONARY ANGIOPLASTY WITH STENT PLACEMENT     x2  . FEMUR IM NAIL Left 01/15/2015   Procedure: INTRAMEDULLARY (IM) NAIL FEMORAL LEFT ;  Surgeon: Netta Cedars, MD;  Location: [facility];  Service: Orthopedics;  Laterality: Left;  . SHOULDER OPEN ROTATOR CUFF REPAIR Right 07/2005  . TONSILLECTOMY    . TOTAL ABDOMINAL HYSTERECTOMY  ~ 1967  . TRANSVAGINAL TAPE (TVT) REMOVAL    . Venous Duplex  07/27/11   Venous Duplex 07/27/11 = Right lower extremity: no evidence of thrombus or thrombophlebitis. Essentially normal right lower extremity venous duplex Doppler evaluation.    OB History    No data available       Home Medications    Prior to Admission medications   Medication Sig Start Date End Date Taking? Authorizing Provider  amoxicillin (AMOXIL) 500 MG capsule Take 500 mg by mouth 3 (three) times daily.   Yes Historical Provider, MD  ACCU-CHEK AVIVA PLUS test strip Test tid. Dx  E11.9 11/27/15   Chipper Herb, MD  acetaminophen (TYLENOL) 500 MG tablet Take 500 mg by mouth every 4 (four) hours as needed.    Historical Provider, MD  ALPRAZolam (XANAX) 0.25 MG tablet TAKE 1/2 TO 1 TABLET 2 TIMES A DAY AS NEEDED FOR ANXIETY 07/03/15   Sharion Balloon, FNP  amiodarone (PACERONE) 200 MG tablet Take 0.5 tablets (100 mg total) by mouth daily. Need appointment before anymore refills 04/12/16   Lorretta Harp, MD  atorvastatin (LIPITOR) 20 MG tablet TAKE 1 TABLET DAILY IN THE EVENING 11/30/15   Chipper Herb, MD  Azelastine HCl 0.15 % SOLN Place 1 spray into both nostrils 2 (two) times daily as needed (congestion).    Historical Provider, MD  baclofen (LIORESAL) 10 MG tablet TAKE 1/2 TABLET EVERY 8 HOURS AS NEEDED FOR MUSCLE SPASM 12/31/15   Chipper Herb, MD  budesonide-formoterol [facility] ) 160-4.5 MCG/ACT inhaler Inhale 1 puff into the lungs 2 (two) times daily. 01/24/14   Chipper Herb, MD  cholecalciferol (VITAMIN D) 1000 units tablet Take 1,000 Units by mouth daily.    Historical Provider, MD  docusate sodium (COLACE) 100 MG capsule Take 100 mg by mouth 2 (two) times daily.    Historical Provider, MD  ferrous sulfate 325 (65 FE) MG tablet TAKE 1 TABLET ONCE DAILY WITH BREAKFAST 02/22/16   Chipper Herb, MD  fluticasone [facility]) 50 MCG/ACT nasal spray Place 1 spray into both nostrils 2 (two) times daily. 12/24/13   Chesley Mires, MD  furosemide (LASIX) 40 MG tablet TAKE  (1)  TABLET TWICE A DAY. 04/04/16   Chipper Herb, MD  glucosamine-chondroitin 500-400 MG tablet Take 1 tablet by mouth 2 (two) times daily.    Historical Provider, MD  HUMALOG KWIKPEN 100 UNIT/ML KiwkPen INJECT UP TO 8 UNITS BEFORE MEALS AS DIRECTED BY SLIDING SCALE. MAX 24 UNITS PER DAY 04/07/15   Fransisca Kaufmann Dettinger, MD  hydrocortisone (ANUSOL-HC) 25 MG suppository Place 1 suppository (25 mg total) rectally 2 (two) times daily as needed for hemorrhoids or itching. 07/30/15   Fransisca Kaufmann Dettinger, MD  Insulin Detemir  (LEVEMIR FLEXTOUCH) 100 UNIT/ML Pen Inject 8 Units into the skin. INJECT 7 UNITS SQ EVERY MORNING 04/22/15   Cherre Robins, PharmD  Insulin Pen Needle (PEN NEEDLES) 31G X 6 MM MISC Use to administer insulin up to qid 05/25/15   Chipper Herb, MD  Ipratropium-Albuterol (COMBIVENT) 20-100 MCG/ACT AERS  respimat Inhale 1 puff into the lungs every 6 (six) hours as needed for wheezing or shortness of breath. 01/24/14   Chipper Herb, MD  metoprolol succinate (TOPROL-XL) 25 MG 24 hr tablet TAKE (1/2) TABLET DAILY. 12/08/15   Chipper Herb, MD  Multiple Vitamins-Minerals (PRESERVISION/LUTEIN) CAPS Take 1 capsule by mouth daily.     Historical Provider, MD  NIFEDICAL XL 30 MG 24 hr tablet Take 1 tablet (30 mg total) by mouth daily. 05/18/15   Lorretta Harp, MD  nitroGLYCERIN (NITROSTAT) 0.4 MG SL tablet Place 1 tablet (0.4 mg total) under the tongue every 5 (five) minutes as needed for chest pain. 05/09/14   Mary-Margaret Hassell Done, FNP  pantoprazole (PROTONIX) 40 MG tablet Take 1 tablet (40 mg total) by mouth daily. 08/12/15   Chipper Herb, MD  polyethylene glycol powder (GLYCOLAX/MIRALAX) powder Take 17 g by mouth daily as needed. 07/30/15   Fransisca Kaufmann Dettinger, MD  potassium chloride SA (K-DUR,KLOR-CON) 20 MEQ tablet TAKE 1 TABLET DAILY 02/22/16   Chipper Herb, MD  PRODIGY TWIST TOP LANCETS 28G MISC CHECK BLOOD SUGAR UP TO 4 TIMES A DAY 05/19/15   Chipper Herb, MD  simethicone (MYLICON) 80 MG chewable tablet Chew 80 mg by mouth at bedtime as needed for flatulence.    Historical Provider, MD  traMADol (ULTRAM) 50 MG tablet Take 1 tablet (50 mg total) by mouth every 6 (six) hours as needed for severe pain. 12/26/15   Julianne Rice, MD  vitamin C (ASCORBIC ACID) 500 MG tablet Take 500 mg by mouth at bedtime.     Historical Provider, MD  warfarin (COUMADIN) 5 MG tablet Take 1/2 tablet on mondays and fridays, take 1 tablet all other days 01/21/16   Cherre Robins, PharmD    Family History Family History    Problem Relation Age of Onset  . Cancer Mother     Skin  . Heart disease Mother   . Emphysema Father   . Heart disease Father   . Congestive Heart Failure Father   . Heart disease Brother   . Heart disease Brother   . Early death Sister     Social History Social History  Substance Use Topics  . Smoking status: Former Smoker    Packs/day: 1.50    Years: 15.00    Types: Cigarettes  . Smokeless tobacco: Never Used     Comment: Smoked 1.5 packs per day for 15 years, quit in 1995.  Marland Kitchen Alcohol use 3.6 oz/week    6 Glasses of wine per week     Comment: 01/13/2015 "couple glasses of wine maybe 3 days/wk"     Allergies   Atorvastatin; Codeine; Morphine; Other; Rosuvastatin; and Iohexol   Review of Systems Review of Systems  Constitutional: Positive for diaphoresis. Negative for chills and fever.  HENT: Positive for sore throat. Negative for congestion and rhinorrhea.   Eyes: Negative for visual disturbance.  Respiratory: Positive for shortness of breath. Negative for cough and wheezing.   Cardiovascular: Positive for leg swelling. Negative for chest pain.  Gastrointestinal: Negative for abdominal pain, diarrhea, nausea and vomiting.  Genitourinary: Negative for dysuria, flank pain, vaginal bleeding and vaginal discharge.  Musculoskeletal: Negative for neck pain.  Skin: Negative for rash.  Allergic/Immunologic: Negative for immunocompromised state.  Neurological: Negative for syncope and headaches.  Hematological: Does not bruise/bleed easily.  All other systems reviewed and are negative.    Physical Exam Updated Vital Signs BP 118/61   Pulse 70  Temp 98.3 F (36.8 C)   Resp 20   Wt 121 lb (54.9 kg)   SpO2 90%   BMI 23.63 kg/m   Physical Exam  Constitutional: She is oriented to person, place, and time. She appears well-developed and well-nourished. No distress.  HENT:  Head: Normocephalic and atraumatic.  Eyes: Conjunctivae are normal.  Neck: Neck supple.   Cardiovascular: Normal rate, regular rhythm and normal heart sounds.  Exam reveals no friction rub.   No murmur heard. Pulmonary/Chest: Effort normal. No respiratory distress. She has no wheezes. She has rales (Mild, bibasilar).  Abdominal: Soft. She exhibits no distension. There is no tenderness.  Musculoskeletal: She exhibits edema (2+ pitting edema bilateral lower extremities).  2+ edema  Neurological: She is alert and oriented to person, place, and time. She exhibits normal muscle tone.  Skin: Skin is warm. Capillary refill takes less than 2 seconds.  Psychiatric: She has a normal mood and affect.  Nursing note and vitals reviewed.    ED Treatments / Results  DIAGNOSTIC STUDIES:  Oxygen Saturation is 90% on NCO2, adequate by my interpretation.    COORDINATION OF CARE:  3:22 PM Discussed treatment plan with pt at bedside and pt agreed to plan.   Labs (all labs ordered are listed, but only abnormal results are displayed) Labs Reviewed  CBC WITH DIFFERENTIAL/PLATELET - Abnormal; Notable for the following:       Result Value   RBC 3.50 (*)    Hemoglobin 10.8 (*)    HCT 35.3 (*)    MCV 100.9 (*)    All other components within normal limits  COMPREHENSIVE METABOLIC PANEL - Abnormal; Notable for the following:    Chloride 99 (*)    CO2 34 (*)    Glucose, Bld 164 (*)    BUN 25 (*)    Creatinine, Ser 1.34 (*)    Total Protein 6.3 (*)    Albumin 2.9 (*)    GFR calc non Af Amer 35 (*)    GFR calc Af Amer 40 (*)    All other components within normal limits  BRAIN NATRIURETIC PEPTIDE - Abnormal; Notable for the following:    B Natriuretic Peptide 505.1 (*)    All other components within normal limits  TROPONIN I - Abnormal; Notable for the following:    Troponin I 0.09 (*)    All other components within normal limits  PROTIME-INR - Abnormal; Notable for the following:    Prothrombin Time 20.8 (*)    All other components within normal limits  TROPONIN I - Abnormal;  Notable for the following:    Troponin I 0.14 (*)    All other components within normal limits  GLUCOSE, CAPILLARY - Abnormal; Notable for the following:    Glucose-Capillary 163 (*)    All other components within normal limits  TROPONIN I  TROPONIN I  TROPONIN I  HEMOGLOBIN A1C  CBC  BASIC METABOLIC PANEL  PROTIME-INR    EKG  EKG Interpretation  Date/Time:  Sunday April 24 2016 17:25:37 EDT Ventricular Rate:  75 PR Interval:    QRS Duration: 147 QT Interval:  423 QTC Calculation: 473 R Axis:   79 Text Interpretation:  Atrial fibrillation Ventricular premature complex Right bundle branch block Baseline wander in lead(s) V2 No significant change since last tracing Confirmed by Estes Lehner MD, Lysbeth Galas [phone]) on 04/24/2016 5:33:45 PM       Radiology Dg Chest 2 View  Result Date: 04/24/2016 CLINICAL DATA:  Shortness of breath and  CHF EXAM: CHEST  2 VIEW COMPARISON:  12/26/2015 chest radiograph. FINDINGS: Stable cardiomediastinal silhouette with moderate cardiomegaly and aortic atherosclerosis. No pneumothorax. No pleural effusion. Hyperinflated lungs. No overt pulmonary edema. No acute consolidative airspace disease. Deformities in the right posterior seventh through ninth ribs, not definitely seen on 12/26/2015 chest radiograph, possibly subacute fractures. IMPRESSION: 1. Stable cardiomegaly without overt pulmonary edema. 2. Hyperinflated lungs, suggesting COPD . 3. Deformities in the right posterior seventh through ninth ribs, not definitely seen on 12/26/2015 chest radiograph, possibly subacute fractures. No pneumothorax. No acute pulmonary disease . 4. Aortic atherosclerosis. Electronically Signed   By: Ilona Sorrel M.D.   On: 04/24/2016 14:54    Procedures Procedures (including critical care time)  Medications Ordered in ED Medications  amoxicillin (AMOXIL) capsule 500 mg (not administered)  amiodarone (PACERONE) tablet 100 mg (not administered)  potassium chloride SA  (K-DUR,KLOR-CON) CR tablet 20 mEq (not administered)  traMADol (ULTRAM) tablet 50 mg (not administered)  metoprolol succinate (TOPROL-XL) 24 hr tablet 12.5 mg (not administered)  atorvastatin (LIPITOR) tablet 20 mg (not administered)  pantoprazole (PROTONIX) EC tablet 40 mg (not administered)  ALPRAZolam (XANAX) tablet 0.25 mg (not administered)  NIFEdipine (PROCARDIA-XL/ADALAT-CC/NIFEDICAL-XL) 24 hr tablet 30 mg (not administered)  insulin detemir (LEVEMIR) injection 8 Units (not administered)  docusate sodium (COLACE) capsule 100 mg (not administered)  nitroGLYCERIN (NITROSTAT) SL tablet 0.4 mg (not administered)  sodium chloride flush (NS) 0.9 % injection 3 mL (not administered)  acetaminophen (TYLENOL) tablet 650 mg (not administered)    Or  acetaminophen (TYLENOL) suppository 650 mg (not administered)  ondansetron (ZOFRAN) tablet 4 mg (not administered)    Or  ondansetron (ZOFRAN) injection 4 mg (not administered)  insulin aspart (novoLOG) injection 0-15 Units (not administered)  insulin aspart (novoLOG) injection 0-5 Units (not administered)  furosemide (LASIX) injection 40 mg (not administered)  ipratropium-albuterol (DUONEB) 0.5-2.5 (3) MG/3ML nebulizer solution 3 mL (not administered)  warfarin (COUMADIN) tablet 2.5 mg (not administered)  Warfarin - Pharmacist Dosing Inpatient (not administered)  budesonide-formoterol (SYMBICORT) 160-4.5 MCG/ACT inhaler 1 puff (1 puff Inhalation Not Given 04/24/16 2316)  aspirin EC tablet 81 mg (not administered)  aspirin chewable tablet 324 mg (324 mg Oral Given 04/24/16 1719)  furosemide (LASIX) injection 40 mg (40 mg Intravenous Given 04/24/16 1843)     Initial Impression / Assessment and Plan / ED Course  I have reviewed the triage vital signs and the nursing notes.  Pertinent labs & imaging results that were available during my care of the patient were reviewed by me and considered in my medical decision making (see chart for  details).  Clinical Course    82 year old female with extensive past medical history as above including CHF who presents with bilateral lower extremity edema and shortness of breath with exertion. On arrival, patient is hemodynamically stable. She is mildly hypoxic but does use oxygen as needed at home. Examination is as above, remarkable for bilateral rales and 2+ pitting edema. Labwork reviewed as above. BNP is elevated at 505. Troponin also elevated at 0.09. EKG shows chronic ischemic changes but no acute changes. Discussed with Dr. Sung Amabile of cardiology. Given patient's otherwise uncomplicated presentation, will admit to the hospitalist service for acute CHF exacerbation. No fevers, leukocytosis, or evidence of pneumonia. Chest x-ray shows no evidence of focal opacification. Patient remains hemodynamically stable and has been given an aspirin as well as IV Lasix.  Final Clinical Impressions(s) / ED Diagnoses   Final diagnoses:  Acute on chronic systolic congestive heart failure (  [place])  Elevated troponin  Elevated brain natriuretic peptide (BNP) level    I personally performed the services described in this documentation, which was scribed in my presence. The recorded information has been reviewed and is accurate.    Duffy Bruce, MD 04/25/16 [phone]

## 2016-04-25 ENCOUNTER — Other Ambulatory Visit (HOSPITAL_COMMUNITY): Payer: Medicare Other

## 2016-04-25 DIAGNOSIS — I5043 Acute on chronic combined systolic (congestive) and diastolic (congestive) heart failure: Secondary | ICD-10-CM

## 2016-04-25 DIAGNOSIS — I130 Hypertensive heart and chronic kidney disease with heart failure and stage 1 through stage 4 chronic kidney disease, or unspecified chronic kidney disease: Secondary | ICD-10-CM | POA: Diagnosis not present

## 2016-04-25 DIAGNOSIS — I10 Essential (primary) hypertension: Secondary | ICD-10-CM

## 2016-04-25 DIAGNOSIS — J439 Emphysema, unspecified: Secondary | ICD-10-CM | POA: Diagnosis not present

## 2016-04-25 DIAGNOSIS — I2510 Atherosclerotic heart disease of native coronary artery without angina pectoris: Secondary | ICD-10-CM | POA: Diagnosis not present

## 2016-04-25 DIAGNOSIS — J449 Chronic obstructive pulmonary disease, unspecified: Secondary | ICD-10-CM | POA: Diagnosis not present

## 2016-04-25 DIAGNOSIS — N183 Chronic kidney disease, stage 3 (moderate): Secondary | ICD-10-CM | POA: Diagnosis not present

## 2016-04-25 LAB — CBC
HCT: 33.7 % — ABNORMAL LOW (ref 36.0–46.0)
Hemoglobin: 10.3 g/dL — ABNORMAL LOW (ref 12.0–15.0)
MCH: 30.4 pg (ref 26.0–34.0)
MCHC: 30.6 g/dL (ref 30.0–36.0)
MCV: 99.4 fL (ref 78.0–100.0)
PLATELETS: 235 10*3/uL (ref 150–400)
RBC: 3.39 MIL/uL — ABNORMAL LOW (ref 3.87–5.11)
RDW: 13.1 % (ref 11.5–15.5)
WBC: 6.4 10*3/uL (ref 4.0–10.5)

## 2016-04-25 LAB — TROPONIN I
TROPONIN I: 0.12 ng/mL — AB (ref <0.03)
TROPONIN I: 0.11 ng/mL — AB (ref <0.03)
Troponin I: 0.12 ng/mL (ref <0.03)

## 2016-04-25 LAB — GLUCOSE, CAPILLARY
GLUCOSE-CAPILLARY: 98 mg/dL (ref 65–99)
GLUCOSE-CAPILLARY: 102 mg/dL — AB (ref 65–99)

## 2016-04-25 LAB — BASIC METABOLIC PANEL
ANION GAP: 6 (ref 5–15)
BUN: 21 mg/dL — ABNORMAL HIGH (ref 6–20)
CALCIUM: 8.7 mg/dL — AB (ref 8.9–10.3)
CO2: 34 mmol/L — ABNORMAL HIGH (ref 22–32)
Chloride: 101 mmol/L (ref 101–111)
Creatinine, Ser: 1.39 mg/dL — ABNORMAL HIGH (ref 0.44–1.00)
GFR, EST AFRICAN AMERICAN: 39 mL/min — AB (ref >60)
GFR, EST NON AFRICAN AMERICAN: 33 mL/min — AB (ref >60)
Glucose, Bld: 115 mg/dL — ABNORMAL HIGH (ref 65–99)
Potassium: 3.7 mmol/L (ref 3.5–5.1)
Sodium: 141 mmol/L (ref 135–145)

## 2016-04-25 LAB — PROTIME-INR
INR: 1.66
PROTHROMBIN TIME: 19.8 s — AB (ref 11.4–15.2)

## 2016-04-25 MED ORDER — WARFARIN SODIUM 5 MG PO TABS: 5.0000 mg | ORAL_TABLET | Freq: Once | ORAL | Status: DC

## 2016-04-25 NOTE — Care Management Note (Signed)
Case Management Note  Patient Details  Name: [name] MRN: [id number] Date of Birth: 04/21/1928  Subjective/Objective:         Admitted with CHF           Action/Plan: Patient lives at home alone, PCP - Redge Gainer, MD; has private insurance with [facility] with prescription drug coverage. Patient is active at home and does not want any HHC at this time.durable medical equipment - home 02, walker. No needs identified at this time.  Expected Discharge Date:    04/25/2016              Expected Discharge Plan:  Home/Self Care  Discharge planning Services  CM Consult  HH Arranged:  Patient Refused  Status of Service:  In process, will continue to follow  Sherrilyn Rist [id number] 04/25/2016, 1:08 PM

## 2016-04-25 NOTE — Progress Notes (Signed)
ANTICOAGULATION CONSULT NOTE - Initial Consult  Pharmacy Consult for Warfarin Indication: atrial fibrillation and pulmonary embolus  Patient Measurements: Height: 5' (152.4 cm) Weight: 118 lb 9.6 oz (53.8 kg) (scale C) IBW/kg (Calculated) : 45.5  Vital Signs: Temp: 97.9 F (36.6 C) (10/30 0640) Temp Source: Oral (10/30 0640) BP: 133/68 (10/30 0640) Pulse Rate: 60 (10/30 0640)  Assessment: 87 YOF with hx CHF on Coumadin 5mg  daily exc for 2.5mg  on Sun/Tues/Thurs PTA for Afib and hx of PE. INR on admit was low at 1.77. Now INR down a little further to 1.66. Hgb 10.3, plts wnl. No s/s of bleed.  Goal of Therapy:  INR 2-3 Monitor platelets by anticoagulation protocol: Yes   Plan:  Give Coumadin 5mg  PO x 1 tonight Monitor daily INR, CBC, s/s of bleed  Elenor Quinones, PharmD, [facility] Clinical Pharmacist Pager [phone] 04/25/2016 10:43 AM

## 2016-04-25 NOTE — Discharge Summary (Signed)
Physician Discharge Summary  [name] [id number] DOB: 04-03-1928 DOA: 04/24/2016  PCP: Redge Gainer, MD  Admit date: 04/24/2016 Discharge date: 04/25/2016   Recommendations for Outpatient Follow-Up:   1. Continued INR monitoring   Discharge Diagnosis:   Principal Problem:   CHF exacerbation ([place]) Active Problems:   PAF, recurrance this admission, on chronic Amio/ Coumadin   COPD with emphysema (HCC)   HTN (hypertension)   CAD, remote RCA PCI, subsequently noted to be occluded, last cath 8/11   DM type 2 causing CKD stage 3 (HCC)   CHF (congestive heart failure) [place])   Discharge disposition:  Home.  Discharge Condition: Improved.  Diet recommendation: Low sodium, heart healthy.  Carbohydrate-modified.   Wound care: None.   History of Present Illness:   [name] is a 82 y.o. woman with a history of CAD, combined systolic and diastolic heart failure (last EF 45-50% in July 2016), paroxysmal atrial fibrillation (CHADS-Vasc score of 9, anticoagulated with warfarin), HTN, DM, and COPD (on home oxygen at baseline, 3L [place] "as needed") who presented to the ED in [facility] for evaluation of progressive DOE associated with chest tightness, orthopnea, and PND for the past three days.  She reports a 4 lb weight gain.  She has increased swelling in her ankles.  She has had light-headedness but no syncope.  No recent falls.  She has had subacute nausea (has been evaluated by her PCP) but no vomiting or diarrhea.  She has intermittent sweats but no fever.  She was recently diagnosed with a tooth abscess and has been on oral amoxicillin.    She admits that she does not really follow dietary restrictions.   Hospital Course by Problem:   Acute exacerbation of chronic diastolic and systolic heart failure --Diuresis with IV lasix x 1-- patient much improved (breathing at baseline, no further LE edema) and wants to go home to follow with own cardiologist   Elevated  troponin, demand ischemia in the setting of acute CHF suspected --continue baby aspirin for now.  On BB and statin at baseline.  No ACE/ARB with renal insufficiency. --outpatient follow up  PAF, anticoagulated with warfarin, INR mildly subtherapeutic at 1.77 --Pharmacy to dose warfarin --Continue amiodarone, toprol  DM, level of control unknown --Continue home dose of levemir  History of COPD, compensated --On baseline O2 requirement of 3L --Continue long-acting inhaler --Duonebs prn  HTN --Continue nifedipine, toprol  CKD 3 --outpatient follow up --Avoid nephrotoxic agents  Recent tooth abscess --Continue oral amoxicillin    Medical Consultants:    None.   Discharge Exam:   Vitals:   04/25/16 0640 04/25/16 1120  BP: 133/68   Pulse: 60 64  Resp: 14   Temp: 97.9 F (36.6 C)    Vitals:   04/24/16 2135 04/25/16 0640 04/25/16 0743 04/25/16 1120  BP: 140/69 133/68    Pulse: 65 60  64  Resp: 14 14    Temp: 98.1 F (36.7 C) 97.9 F (36.6 C)    TempSrc: Oral Oral    SpO2: 100% 100%    Weight: 54.3 kg (119 lb 11.2 oz)  53.8 kg (118 lb 9.6 oz)   Height: 5' (1.524 m)       Gen:  NAD- on 3L O2    The results of significant diagnostics from this hospitalization (including imaging, microbiology, ancillary and laboratory) are listed below for reference.     Procedures and Diagnostic Studies:   Dg Chest 2 View  Result Date: 04/24/2016 CLINICAL  DATA:  Shortness of breath and CHF EXAM: CHEST  2 VIEW COMPARISON:  12/26/2015 chest radiograph. FINDINGS: Stable cardiomediastinal silhouette with moderate cardiomegaly and aortic atherosclerosis. No pneumothorax. No pleural effusion. Hyperinflated lungs. No overt pulmonary edema. No acute consolidative airspace disease. Deformities in the right posterior seventh through ninth ribs, not definitely seen on 12/26/2015 chest radiograph, possibly subacute fractures. IMPRESSION: 1. Stable cardiomegaly without overt  pulmonary edema. 2. Hyperinflated lungs, suggesting COPD . 3. Deformities in the right posterior seventh through ninth ribs, not definitely seen on 12/26/2015 chest radiograph, possibly subacute fractures. No pneumothorax. No acute pulmonary disease . 4. Aortic atherosclerosis. Electronically Signed   By: Ilona Sorrel M.D.   On: 04/24/2016 14:54     Labs:   Basic Metabolic Panel:  Recent Labs Lab 04/24/16 1610 04/25/16 0457  NA 141 141  K 4.2 3.7  CL 99* 101  CO2 34* 34*  GLUCOSE 164* 115*  BUN 25* 21*  CREATININE 1.34* 1.39*  CALCIUM 9.1 8.7*   GFR Estimated Creatinine Clearance: 20.9 mL/min (by C-G formula based on SCr of 1.39 mg/dL (H)). Liver Function Tests:  Recent Labs Lab 04/24/16 1610  AST 25  ALT 28  ALKPHOS 83  BILITOT 0.6  PROT 6.3*  ALBUMIN 2.9*   No results for input(s): LIPASE, AMYLASE in the last 168 hours. No results for input(s): AMMONIA in the last 168 hours. Coagulation profile  Recent Labs Lab 04/24/16 1610 04/25/16 0825  INR 1.77 1.66    CBC:  Recent Labs Lab 04/24/16 1610 04/25/16 0457  WBC 6.7 6.4  NEUTROABS 4.6  --   HGB 10.8* 10.3*  HCT 35.3* 33.7*  MCV 100.9* 99.4  PLT 254 235   Cardiac Enzymes:  Recent Labs Lab 04/24/16 1610 04/24/16 2010 04/25/16 0205 04/25/16 0457 04/25/16 0825  TROPONINI 0.09* 0.14* 0.12* 0.12* 0.11*   BNP: Invalid input(s): POCBNP CBG:  Recent Labs Lab 04/24/16 2152 04/25/16 0654 04/25/16 1125  GLUCAP 163* 98 102*   D-Dimer No results for input(s): DDIMER in the last 72 hours. Hgb A1c No results for input(s): HGBA1C in the last 72 hours. Lipid Profile No results for input(s): CHOL, HDL, LDLCALC, TRIG, CHOLHDL, LDLDIRECT in the last 72 hours. Thyroid function studies No results for input(s): TSH, T4TOTAL, T3FREE, THYROIDAB in the last 72 hours.  Invalid input(s): FREET3 Anemia work up No results for input(s): VITAMINB12, FOLATE, FERRITIN, TIBC, IRON, RETICCTPCT in the last 72  hours. Microbiology No results found for this or any previous visit (from the past 240 hour(s)).   Discharge Instructions:   Discharge Instructions    Diet - low sodium heart healthy    Complete by:  As directed    Diet Carb Modified    Complete by:  As directed    Increase activity slowly    Complete by:  As directed        Medication List    TAKE these medications   ACCU-CHEK AVIVA PLUS test strip Generic drug:  glucose blood Test tid. Dx E11.9   acetaminophen 500 MG tablet Commonly known as:  TYLENOL Take 500 mg by mouth every 4 (four) hours as needed for mild pain.   ALPRAZolam 0.25 MG tablet Commonly known as:  XANAX TAKE 1/2 TO 1 TABLET 2 TIMES A DAY AS NEEDED FOR ANXIETY   amiodarone 200 MG tablet Commonly known as:  PACERONE Take 0.5 tablets (100 mg total) by mouth daily. Need appointment before anymore refills   atorvastatin 20 MG tablet Commonly known  as:  LIPITOR TAKE 1 TABLET DAILY IN THE EVENING   Azelastine HCl 0.15 % Soln Place 1 spray into both nostrils 2 (two) times daily as needed (congestion).   baclofen 10 MG tablet Commonly known as:  LIORESAL TAKE 1/2 TABLET EVERY 8 HOURS AS NEEDED FOR MUSCLE SPASM   budesonide-formoterol 160-4.5 MCG/ACT inhaler Commonly known as:  SYMBICORT Inhale 1 puff into the lungs 2 (two) times daily.   cholecalciferol 1000 units tablet Commonly known as:  VITAMIN D Take 1,000 Units by mouth daily.   docusate sodium 100 MG capsule Commonly known as:  COLACE Take 100 mg by mouth 2 (two) times daily.   ferrous sulfate 325 (65 FE) MG tablet TAKE 1 TABLET ONCE DAILY WITH BREAKFAST   fluticasone 50 MCG/ACT nasal spray Commonly known as:  FLONASE Place 1 spray into both nostrils 2 (two) times daily.   furosemide 40 MG tablet Commonly known as:  LASIX TAKE  (1)  TABLET TWICE A DAY.   glucosamine-chondroitin 500-400 MG tablet Take 1 tablet by mouth 2 (two) times daily.   HUMALOG KWIKPEN 100 UNIT/ML  KiwkPen Generic drug:  insulin lispro INJECT UP TO 8 UNITS BEFORE MEALS AS DIRECTED BY SLIDING SCALE. MAX 24 UNITS PER DAY   hydrocortisone 25 MG suppository Commonly known as:  ANUSOL-HC Place 1 suppository (25 mg total) rectally 2 (two) times daily as needed for hemorrhoids or itching.   Ipratropium-Albuterol 20-100 MCG/ACT Aers respimat Commonly known as:  COMBIVENT Inhale 1 puff into the lungs every 6 (six) hours as needed for wheezing or shortness of breath.   LEVEMIR FLEXTOUCH 100 UNIT/ML Pen Generic drug:  Insulin Detemir Inject 7 Units into the skin daily.   metoprolol succinate 25 MG 24 hr tablet Commonly known as:  TOPROL-XL TAKE (1/2) TABLET DAILY.   NIFEDICAL XL 30 MG 24 hr tablet Generic drug:  NIFEdipine Take 1 tablet (30 mg total) by mouth daily.   nitroGLYCERIN 0.4 MG SL tablet Commonly known as:  NITROSTAT Place 1 tablet (0.4 mg total) under the tongue every 5 (five) minutes as needed for chest pain.   OXYGEN Inhale 2.5 L into the lungs daily.   pantoprazole 40 MG tablet Commonly known as:  PROTONIX Take 1 tablet (40 mg total) by mouth daily.   Pen Needles 31G X 6 MM Misc Use to administer insulin up to qid   polyethylene glycol powder powder Commonly known as:  GLYCOLAX/MIRALAX Take 17 g by mouth daily as needed. What changed:  reasons to take this   potassium chloride SA 20 MEQ tablet Commonly known as:  K-DUR,KLOR-CON TAKE 1 TABLET DAILY   PRESERVISION/LUTEIN Caps Take 1 capsule by mouth daily.   PRODIGY TWIST TOP LANCETS 28G Misc CHECK BLOOD SUGAR UP TO 4 TIMES A DAY   simethicone 80 MG chewable tablet Commonly known as:  MYLICON Chew 80 mg by mouth at bedtime as needed for flatulence.   traMADol 50 MG tablet Commonly known as:  ULTRAM Take 1 tablet (50 mg total) by mouth every 6 (six) hours as needed for severe pain.   vitamin C 500 MG tablet Commonly known as:  ASCORBIC ACID Take 500 mg by mouth at bedtime.   warfarin 5 MG  tablet Commonly known as:  COUMADIN Take 2.5-5 mg by mouth daily. Take 2.5 mg on Sun/ Tues/ Thurs Take 5 mg on Mon/ Wed/ Fri/ Sat      Follow-up Information    Redge Gainer, MD Follow up on 05/02/2016.  Specialty:  Family Medicine Why:  9:10am for hospital follow up  Contact information: [address] [place] [id number] [phone]            Time coordinating discharge: 35 min  Signed:  [place] [facility] 04/25/2016, 12:12 PM

## 2016-04-25 NOTE — Progress Notes (Signed)
Patient arrived to 3E13 from [facility] . A&O x 4. No complaints of pain. No open wounds. Patient is SR on telemetry. Admit for SOB for 2 days. Patient oriented to room and call light. Bed alarm set.

## 2016-04-25 NOTE — Progress Notes (Signed)
Pt and pt's daughter in law given all discharge instructions with questions answered and verbalization from both pt and daughter in law. Pt is very knowledgeable about her meds and chf info. Pt weighs herself daily with the asst of a neighbor daily. Pt and daughter in law stated to Olga Coaster thn care mgr that they need no dischg assistance once at home. Pt discharged with all belongings via wheelchair to car with family.

## 2016-04-26 ENCOUNTER — Encounter: Payer: Self-pay | Admitting: Pulmonary Disease

## 2016-04-26 ENCOUNTER — Ambulatory Visit (INDEPENDENT_AMBULATORY_CARE_PROVIDER_SITE_OTHER): Payer: Medicare Other | Admitting: Pulmonary Disease

## 2016-04-26 ENCOUNTER — Telehealth: Payer: Self-pay | Admitting: *Deleted

## 2016-04-26 VITALS — BP 134/64 | HR 75 | Ht 60.0 in | Wt 119.0 lb

## 2016-04-26 DIAGNOSIS — J9611 Chronic respiratory failure with hypoxia: Secondary | ICD-10-CM | POA: Diagnosis not present

## 2016-04-26 DIAGNOSIS — J432 Centrilobular emphysema: Secondary | ICD-10-CM

## 2016-04-26 LAB — HEMOGLOBIN A1C
HEMOGLOBIN A1C: 7.2 % — AB (ref 4.8–5.6)
MEAN PLASMA GLUCOSE: 160 mg/dL

## 2016-04-26 MED ORDER — BUDESONIDE-FORMOTEROL FUMARATE 160-4.5 MCG/ACT IN AERO: 2.0000 | INHALATION_SPRAY | Freq: Two times a day (BID) | RESPIRATORY_TRACT | 0 refills | Status: DC

## 2016-04-26 NOTE — Addendum Note (Signed)
Addended by: Parke Poisson E on: 04/26/2016 12:33 PM   Modules accepted: Orders

## 2016-04-26 NOTE — Progress Notes (Signed)
Current Outpatient Prescriptions on File Prior to Visit  Medication Sig  . ACCU-CHEK AVIVA PLUS test strip Test tid. Dx E11.9  . acetaminophen (TYLENOL) 500 MG tablet Take 500 mg by mouth every 4 (four) hours as needed for mild pain.   Marland Kitchen ALPRAZolam (XANAX) 0.25 MG tablet TAKE 1/2 TO 1 TABLET 2 TIMES A DAY AS NEEDED FOR ANXIETY  . amiodarone (PACERONE) 200 MG tablet Take 0.5 tablets (100 mg total) by mouth daily. Need appointment before anymore refills  . atorvastatin (LIPITOR) 20 MG tablet TAKE 1 TABLET DAILY IN THE EVENING  . Azelastine HCl 0.15 % SOLN Place 1 spray into both nostrils 2 (two) times daily as needed (congestion).  . baclofen (LIORESAL) 10 MG tablet TAKE 1/2 TABLET EVERY 8 HOURS AS NEEDED FOR MUSCLE SPASM  . budesonide-formoterol (SYMBICORT) 160-4.5 MCG/ACT inhaler Inhale 1 puff into the lungs 2 (two) times daily.  . cholecalciferol (VITAMIN D) 1000 units tablet Take 1,000 Units by mouth daily.  Marland Kitchen docusate sodium (COLACE) 100 MG capsule Take 100 mg by mouth daily as needed.   . ferrous sulfate 325 (65 FE) MG tablet TAKE 1 TABLET ONCE DAILY WITH BREAKFAST  . fluticasone (FLONASE) 50 MCG/ACT nasal spray Place 1 spray into both nostrils 2 (two) times daily. (Patient taking differently: Place 1 spray into both nostrils as needed. )  . furosemide (LASIX) 40 MG tablet TAKE  (1)  TABLET TWICE A DAY.  Marland Kitchen glucosamine-chondroitin 500-400 MG tablet Take 1 tablet by mouth 2 (two) times daily.  Marland Kitchen HUMALOG KWIKPEN 100 UNIT/ML KiwkPen INJECT UP TO 8 UNITS BEFORE MEALS AS DIRECTED BY SLIDING SCALE. MAX 24 UNITS PER DAY  . hydrocortisone (ANUSOL-HC) 25 MG suppository Place 1 suppository (25 mg total) rectally 2 (two) times daily as needed for hemorrhoids or itching.  . Insulin Detemir (LEVEMIR FLEXTOUCH) 100 UNIT/ML Pen Inject 7 Units into the skin daily.   . Insulin Pen Needle (PEN NEEDLES) 31G X 6 MM MISC Use to administer insulin up to qid  . Ipratropium-Albuterol (COMBIVENT) 20-100 MCG/ACT AERS  respimat Inhale 1 puff into the lungs every 6 (six) hours as needed for wheezing or shortness of breath.  . metoprolol succinate (TOPROL-XL) 25 MG 24 hr tablet TAKE (1/2) TABLET DAILY.  . Multiple Vitamins-Minerals (PRESERVISION/LUTEIN) CAPS Take 1 capsule by mouth daily.   Marland Kitchen NIFEDICAL XL 30 MG 24 hr tablet Take 1 tablet (30 mg total) by mouth daily.  . nitroGLYCERIN (NITROSTAT) 0.4 MG SL tablet Place 1 tablet (0.4 mg total) under the tongue every 5 (five) minutes as needed for chest pain.  . OXYGEN Inhale 2.5 L into the lungs daily.  . pantoprazole (PROTONIX) 40 MG tablet Take 1 tablet (40 mg total) by mouth daily.  . polyethylene glycol powder (GLYCOLAX/MIRALAX) powder Take 17 g by mouth daily as needed. (Patient taking differently: Take 17 g by mouth daily as needed for mild constipation. )  . potassium chloride SA (K-DUR,KLOR-CON) 20 MEQ tablet TAKE 1 TABLET DAILY  . PRODIGY TWIST TOP LANCETS 28G MISC CHECK BLOOD SUGAR UP TO 4 TIMES A DAY  . simethicone (MYLICON) 80 MG chewable tablet Chew 80 mg by mouth at bedtime as needed for flatulence.  . vitamin C (ASCORBIC ACID) 500 MG tablet Take 500 mg by mouth at bedtime.   Marland Kitchen warfarin (COUMADIN) 5 MG tablet Take 2.5-5 mg by mouth daily. Take 2.5 mg on Sun/ Tues/ Thurs Take 5 mg on Mon/ Wed/ Fri/ Sat  . traMADol (ULTRAM) 50 MG tablet  Take 1 tablet (50 mg total) by mouth every 6 (six) hours as needed for severe pain. (Patient not taking: Reported on 04/26/2016)   No current facility-administered medications on file prior to visit.     Chief Complaint  Patient presents with  . Hospitalization Follow-up    pt states she is having morning sickness, and night sweats. pt states she is flushed during the day. pt is having tightness in the chest.  . CENTRILOBULAR EMPHYSEMA    Pulmonary tests PFT 06/08/10>>FEV1 0.93(62%), FEV1% 60, TLC 2.84(69%), DLCO 54%, +BD ONO with 2.5 liters 05/07/12>>Test time 3 hrs 47 min. Mean SpO2 99%, low SpO2 96%. ONO with  RA 01/15/16 >> test time 5 hrs 40 min.  Mean SpO2 88.6%, low SpO2 77%.  Spent 2 hrs 27 min with SpO2 < 88% Ambulatory oximetry 04/26/16 >> 87% room air at rest  Cardiac tests Echo 01/20/15 >> EF 45 to 50%, PAS 55 mmHg  Past medical history HTN. HLD, CAD, diastolic CHF, RBBB, PAF, DM, Anxiety, GERD  Past surgical history, Family history, Social history, Allergies reviewed  Vital signs BP 134/64 (BP Location: Left Arm, Cuff Size: Normal)   Pulse 75   Ht 5' (1.524 m)   Wt 119 lb (54 kg)   SpO2 96%   BMI 23.24 kg/m    History of Present Illness: [name] is a 82 y.o. female former smoker with GOLD 4 COPD, and chronic respiratory failure with hypoxia.  She was in hospital 2 days ago with CHF exacerbation.  She was also being treated for a tooth abscess.  She improved after dose of lasix and discharged home yesterday.  She gets nauseous in the morning.  Her blood sugar is usually around 100 to 110.  Her levels go up to 120 to 130 later in the day.  She is using 3 liters oxygen at night.  She is not having cough, wheeze, or sputum.  She still has ankle swelling.  Physical Exam:  General - Pleasant HEENT - no sinus tenderness, no oral exudate, no LAN Cardiac - s1s2 Chest - no wheeze/rales Abdomen - soft, nontender Extremities - ankle edema Skin - no rashes Neurologic - normal strength Psychiatric - normal mood, behavior   Dg Chest 2 View  Result Date: 04/24/2016 CLINICAL DATA:  Shortness of breath and CHF EXAM: CHEST  2 VIEW COMPARISON:  12/26/2015 chest radiograph. FINDINGS: Stable cardiomediastinal silhouette with moderate cardiomegaly and aortic atherosclerosis. No pneumothorax. No pleural effusion. Hyperinflated lungs. No overt pulmonary edema. No acute consolidative airspace disease. Deformities in the right posterior seventh through ninth ribs, not definitely seen on 12/26/2015 chest radiograph, possibly subacute fractures. IMPRESSION: 1. Stable cardiomegaly without  overt pulmonary edema. 2. Hyperinflated lungs, suggesting COPD . 3. Deformities in the right posterior seventh through ninth ribs, not definitely seen on 12/26/2015 chest radiograph, possibly subacute fractures. No pneumothorax. No acute pulmonary disease . 4. Aortic atherosclerosis. Electronically Signed   By: Ilona Sorrel M.D.   On: 04/24/2016 14:54    CMP Latest Ref Rng & Units 04/25/2016 04/24/2016 03/17/2016  Glucose 65 - 99 mg/dL 115(H) 164(H) 102(H)  BUN 6 - 20 mg/dL 21(H) 25(H) 17  Creatinine 0.44 - 1.00 mg/dL 1.39(H) 1.34(H) 1.07(H)  Sodium 135 - 145 mmol/L 141 141 144  Potassium 3.5 - 5.1 mmol/L 3.7 4.2 4.1  Chloride 101 - 111 mmol/L 101 99(L) 98  CO2 22 - 32 mmol/L 34(H) 34(H) 31(H)  Calcium 8.9 - 10.3 mg/dL 8.7(L) 9.1 9.8  Total Protein  6.5 - 8.1 g/dL - 6.3(L) 6.7  Total Bilirubin 0.3 - 1.2 mg/dL - 0.6 0.5  Alkaline Phos 38 - 126 U/L - 83 109  AST 15 - 41 U/L - 25 20  ALT 14 - 54 U/L - 28 29    CBC Latest Ref Rng & Units 04/25/2016 04/24/2016 03/17/2016  WBC 4.0 - 10.5 K/uL 6.4 6.7 6.4  Hemoglobin 12.0 - 15.0 g/dL 10.3(L) 10.8(L) -  Hematocrit 36.0 - 46.0 % 33.7(L) 35.3(L) 40.4  Platelets 150 - 400 K/uL 235 254 228    Assessment/Plan:  COPD with emphysema. - she can continue symbicort with prn combivent  Chronic hypoxic respiratory failure. - continue 3 liters oxygen - will arrange for ONO on 3 liters  Diabetes mellitus. - advised her to monitor blood sugar and determine if her morning nausea is associated with low blood sugars in the morning   Patient Instructions  Will arrange for portable oxygen set up to use during the day  Will arrange for overnight oxygen test with you using 3 liters oxygen  Follow up in 3 months    Chesley Mires, MD [place] 04/26/2016, 11:53 AM Pager:  [phone]

## 2016-04-26 NOTE — Patient Instructions (Addendum)
Will arrange for portable oxygen set up to use during the day  Will arrange for overnight oxygen test with you using 3 liters oxygen  Follow up in 3 months

## 2016-05-02 ENCOUNTER — Ambulatory Visit (INDEPENDENT_AMBULATORY_CARE_PROVIDER_SITE_OTHER): Payer: Medicare Other | Admitting: Family Medicine

## 2016-05-02 ENCOUNTER — Other Ambulatory Visit: Payer: Self-pay | Admitting: *Deleted

## 2016-05-02 ENCOUNTER — Encounter: Payer: Self-pay | Admitting: Family Medicine

## 2016-05-02 VITALS — BP 149/67 | HR 69 | Temp 97.7°F | Ht 61.52 in | Wt 118.0 lb

## 2016-05-02 DIAGNOSIS — I509 Heart failure, unspecified: Secondary | ICD-10-CM | POA: Diagnosis not present

## 2016-05-02 DIAGNOSIS — E1122 Type 2 diabetes mellitus with diabetic chronic kidney disease: Secondary | ICD-10-CM | POA: Diagnosis not present

## 2016-05-02 DIAGNOSIS — N183 Chronic kidney disease, stage 3 (moderate): Secondary | ICD-10-CM

## 2016-05-02 DIAGNOSIS — I480 Paroxysmal atrial fibrillation: Secondary | ICD-10-CM

## 2016-05-02 DIAGNOSIS — E785 Hyperlipidemia, unspecified: Secondary | ICD-10-CM

## 2016-05-02 LAB — COAGUCHEK XS/INR WAIVED
INR: 1.6 — AB (ref 0.9–1.1)
Prothrombin Time: 19.3 s

## 2016-05-02 MED ORDER — WARFARIN SODIUM 4 MG PO TABS: 4.0000 mg | ORAL_TABLET | Freq: Every day | ORAL | 1 refills | Status: DC

## 2016-05-02 NOTE — Addendum Note (Signed)
Addended by: Cherre Robins on: 05/02/2016 04:41 PM   Modules accepted: Orders

## 2016-05-02 NOTE — Progress Notes (Signed)
   HPI  Patient presents today here for hospital follow-up.  Patient was admitted to the hospital on 04/24/2016 with a CHF exacerbation. She was also found to have elevated troponin which stayed pretty much stable throughout visit, this was considered to be demand ischemia. She also has history of diabetes and COPD. She is anticoagulated with paroxysmal atrial fibrillation, she was subtherapeutic prior to leaving the hospital.  She has no sources of bleeding and is taking her Coumadin regularly. It is managed by local pharmacy who makes a pillbox for her.  Patient states her breathing is slightly worse since she left the hospital. She still getting very good diuresis 6.  Her blood sugars have been up and down, however she has stopped Levemir over the last few days. She has only been taking NovoLog in response to hyperglycemia 2-3 times daily. She's seen several lows in the 62s and 60s. Several high as up to 399   PMH: Smoking status noted ROS: Per HPI  Objective: BP (!) 149/67   Pulse 69   Temp 97.7 F (36.5 C) (Oral)   Ht 5' 1.52" (1.563 m)   Wt 118 lb (53.5 kg)   BMI 21.92 kg/m  Gen: NAD, alert, cooperative with exam HEENT: NCAT CV: RRR, good S1/S2, no murmur Resp: Nonlabored, soft crackles at the bases Ext: No edema, warm Neuro: Alert and oriented, No gross deficits  Assessment and plan:  # CHF Stable Appears euvolemic to slightly up, her weight is stable Continue current dose of Lasix at this time  # Type 2 diabetes Labile blood sugars, encouraged her to restart her Levemir 3-4 units daily. Consider changing to tresiba for less hypoglycemia and longer half life Stop novolog for 2-3 days, I believe her NovoLog doses are pushing her lower to the 50s and she's having hyperglycemia due to not taking her Levemir. Its very unclear what dose of Levemir she usually takes, it sounds like it's 4-5 units daily.  # Acute kidney disease stage III Repeat labs Expect mild bump  in cre due to recent CHF exacerbation and increased diuresis  # Atrial fibrillation with chronic anticoagulation Still patient is slightly sub therapeutic INR 1.6 No bleeding Will ask our clinical pharmacist to coordinate with her pharmacy to change next weeks pillbox which is due on Wednesday. Increased by approximately 10% weekly Coumadin dose    Orders Placed This Encounter  Procedures  . CBC with Differential/Platelet  . Marine, MD [facility] 05/02/2016, 11:54 AM

## 2016-05-02 NOTE — Patient Instructions (Addendum)
Great to see you!  Come back in 1 week to see Dr. Laurance Flatten or myself.   Start taking levemir 3 units daily and try to leave the novolog alone unless your blood sugar is persistently above 350  We will work with [place] to adjust your warfarin dose

## 2016-05-02 NOTE — Patient Outreach (Signed)
[facility]) Care Management  05/02/2016  [name] [birth year] [id number]  EMMI-Heart Failure referral via red dashboard -new-worsening problem:  Telephone call to patient; unable to leave message due mailbox not accepting messages. Telephone call to alternate number; person who answered call states patient was not available but would take message-contact information was given.  Plan:  Will follow up.  Sherrin Daisy, RN BSN [place] Management Coordinator [facility] Care Management  [phone]

## 2016-05-03 ENCOUNTER — Encounter: Payer: Self-pay | Admitting: Pharmacist

## 2016-05-03 ENCOUNTER — Other Ambulatory Visit: Payer: Self-pay | Admitting: *Deleted

## 2016-05-03 LAB — CBC WITH DIFFERENTIAL/PLATELET
Basophils Absolute: 0.1 10*3/uL (ref 0.0–0.2)
Basos: 2 %
EOS (ABSOLUTE): 0.5 10*3/uL — ABNORMAL HIGH (ref 0.0–0.4)
EOS: 7 %
HEMATOCRIT: 35.2 % (ref 34.0–46.6)
HEMOGLOBIN: 11.3 g/dL (ref 11.1–15.9)
IMMATURE GRANULOCYTES: 0 %
Immature Grans (Abs): 0 10*3/uL (ref 0.0–0.1)
LYMPHS ABS: 1 10*3/uL (ref 0.7–3.1)
Lymphs: 13 %
MCH: 30.8 pg (ref 26.6–33.0)
MCHC: 32.1 g/dL (ref 31.5–35.7)
MCV: 96 fL (ref 79–97)
MONOCYTES: 12 %
Monocytes Absolute: 0.9 10*3/uL (ref 0.1–0.9)
Neutrophils Absolute: 5 10*3/uL (ref 1.4–7.0)
Neutrophils: 66 %
Platelets: 310 10*3/uL (ref 150–379)
RBC: 3.67 x10E6/uL — AB (ref 3.77–5.28)
RDW: 12.5 % (ref 12.3–15.4)
WBC: 7.5 10*3/uL (ref 3.4–10.8)

## 2016-05-03 LAB — CMP14+EGFR
ALBUMIN: 3.8 g/dL (ref 3.5–4.7)
ALT: 39 IU/L — ABNORMAL HIGH (ref 0–32)
AST: 27 IU/L (ref 0–40)
Albumin/Globulin Ratio: 1.4 (ref 1.2–2.2)
Alkaline Phosphatase: 115 IU/L (ref 39–117)
BUN / CREAT RATIO: 24 (ref 12–28)
BUN: 22 mg/dL (ref 8–27)
Bilirubin Total: 0.6 mg/dL (ref 0.0–1.2)
CALCIUM: 9.9 mg/dL (ref 8.7–10.3)
CO2: 31 mmol/L — AB (ref 18–29)
CREATININE: 0.92 mg/dL (ref 0.57–1.00)
Chloride: 96 mmol/L (ref 96–106)
GFR, EST AFRICAN AMERICAN: 65 mL/min/{1.73_m2} (ref >59)
GFR, EST NON AFRICAN AMERICAN: 57 mL/min/{1.73_m2} — AB (ref >59)
GLOBULIN, TOTAL: 2.7 g/dL (ref 1.5–4.5)
Glucose: 122 mg/dL — ABNORMAL HIGH (ref 65–99)
Potassium: 4.4 mmol/L (ref 3.5–5.2)
SODIUM: 145 mmol/L — AB (ref 134–144)
Total Protein: 6.5 g/dL (ref 6.0–8.5)

## 2016-05-03 NOTE — Patient Outreach (Signed)
[name]) Care Management  05/03/2016  [name] 29-Sep-1927 [id number]  EMMI-Heart Failure -red dashboard referral (new/worsening problem,  nausea, dizzy). Patient has hospital encounter 10/29-10/30/2017 at [facility] with dx CHF exacerbation.  Second outreach call to patient who was advised of reason for call & of [facility] care management services.  Patient gave HIPPA verification and voices that she is hard of hearing but could hear better when RN CM spoke louder.  Patient voices that she had answered questions for electronic caller on Sunday but is feeling better today. States breathing is easier & she no longer has nausea or dizziness.    Patient voices that she has attended appointments with primary care-Dr. Laurance Flatten 11/06 and specialist-Dr. Halford Chessman 04/29/16. States her daughter-in-law takes her to all of her appointments. States care givers provide transportation to other appointments.   Patient voices that she has several caregivers that help her out doing the day but that she is alone during the night. States she prefers it that way. States she does her own personal care & cooks for herself.   Patient voices that [facility] prepares her medication weekly and she takes as prescribed by her doctors.  States she does not know the names of medications but takes each medication as prepared by pharmacy at he times that she should take. States she also administers her own insulin & checks her blood sugars. Voices that she has attended outpatient diabetes classes in the past.   Patient voices that she weighs her daily with caregiver's assistance  & today's weight was 118 pounds. States no weight change since hospitalized.  Patient voices that she knows when she needs to call MD for weight gain & swelling. Also states she will call 911 if she needs to.  States currently using oxygen most of the time since she left hospital.  States breathing is easier.  EMMI-call completed.    Patient was advised of care management services. Patient voices that she has access to her doctors as needed & has caregivers that help her as needed.  States she is not really sure if she will take any further calls from electronic caller however thanked this RN care coordinator for calling. She declined [facility] services whether community or telephonic.  Consents to [facility] contact information.  Plan: Close out case; send to care management assistant. Send MD closure letter. Send [facility] contact information to patient.Sherrin Daisy, RN BSN [place] Management Coordinator [facility] Care Management  [phone]

## 2016-05-04 ENCOUNTER — Other Ambulatory Visit: Payer: Self-pay | Admitting: *Deleted

## 2016-05-04 ENCOUNTER — Other Ambulatory Visit: Payer: Self-pay | Admitting: Family Medicine

## 2016-05-04 ENCOUNTER — Other Ambulatory Visit: Payer: Self-pay | Admitting: Cardiovascular Disease

## 2016-05-04 ENCOUNTER — Ambulatory Visit (INDEPENDENT_AMBULATORY_CARE_PROVIDER_SITE_OTHER): Payer: Medicare Other | Admitting: Family Medicine

## 2016-05-04 VITALS — BP 135/65 | HR 70 | Temp 97.4°F | Wt 122.2 lb

## 2016-05-04 DIAGNOSIS — I509 Heart failure, unspecified: Secondary | ICD-10-CM | POA: Diagnosis not present

## 2016-05-04 MED ORDER — TORSEMIDE 20 MG PO TABS: 20.0000 mg | ORAL_TABLET | Freq: Two times a day (BID) | ORAL | 1 refills | Status: DC

## 2016-05-04 NOTE — Patient Outreach (Signed)
[facility]) Care Management  05/04/2016  Madelynne Rad [birth year] [id number]  EMMI-Heart Failure referral from dashboard 11/7:  This referral has already been addressed on call to patient on 05/03/2016. See notes.  Plan: Close out case. Send to care management assistant.Sherrin Daisy, RN BSN [place] Management Coordinator [facility] Care Management  [phone]

## 2016-05-04 NOTE — Patient Instructions (Signed)
Stop furosemide and start torsemide, 1 pill twice daily.

## 2016-05-04 NOTE — Telephone Encounter (Signed)
Rx has been sent to the pharmacy electronically. ° °

## 2016-05-04 NOTE — Progress Notes (Signed)
   HPI  Patient presents today for an acute visit for shortness of breath.  Patient states that last night she had difficulty breathing most of the night. This morning she is very worried and called in to be evaluated.  She states that she has not been diuresing well with her usual Lasix over the last few days. She was seen 2 days ago and reported good diuresis at that time.  She denies fever, chills, sweats, or feeling ill. She's tolerating foods and fluids normally. She does not have any chest pain.  PMH: Smoking status noted ROS: Per HPI  Objective: BP 135/65 (BP Location: Left Arm, Patient Position: Sitting, Cuff Size: Normal)   Pulse 70   Temp 97.4 F (36.3 C) (Oral)   SpO2 (!) 77%  Gen: NAD, alert, cooperative with exam HEENT: NCAT CV: RRR, good S1/S2, no murmur Resp: Nonlabored, crackles at bilateral bases, initial O2 saturation was 77%, with 2 L of oxygen via nasal cannula her O2 sats are 99-100%. Abd: SNTND, BS present, no guarding or organomegaly Ext: No edema, warm Neuro: Alert and oriented, No gross deficits  Weight gain change from 118-122 in 2 days, this is on the same scale in our clinic.  Assessment and plan:  # CHF Respiratory changes are most likely due to poor diuresis. I'm suspicious of bowel wall edema preventing furosemide from being welds ordered. I changed her to torsemide 20 mg twice daily. Follow-up next week No chest pain, fevers, chills, or worsening cough to indicate ischemic etiology or worsening COPD.  Her breathing  is much more comfortable now that she is on oxygen. She uses 2-3 L of oxygen via nasal cannula at home    Meds ordered this encounter  Medications  . torsemide (DEMADEX) 20 MG tablet    Sig: Take 1 tablet (20 mg total) by mouth 2 (two) times daily.    Dispense:  60 tablet    Refill:  1    replacing lasix    Laroy Apple, MD [facility] 05/04/2016, 12:09 PM

## 2016-05-05 ENCOUNTER — Encounter: Payer: Self-pay | Admitting: *Deleted

## 2016-05-06 ENCOUNTER — Telehealth: Payer: Self-pay | Admitting: Pharmacist

## 2016-05-06 NOTE — Telephone Encounter (Signed)
Freida from Dr Bobby Rumpf' office called regarding Mrs. Whitebread's up coming tooth extraction which is scheduled for 05/17/2016.  Dr. Bobby Rumpf would like for her to hold warfarin 3 to 5 days prior to procedure.  We will communicate this to patient and her pharmacy which fills her pill boxes.  No warfarin 11/18, 11/19,  Or 11/20.  Will restart warfarin the evening of 11/21 at current dose of 4mg  daily. Pharmacy notified - spoke with Suanne Marker.

## 2016-05-08 ENCOUNTER — Emergency Department (HOSPITAL_COMMUNITY)
Admission: EM | Admit: 2016-05-08 | Discharge: 2016-05-08 | Disposition: A | Discharge status: Home / Self Care | Payer: Medicare Other | Attending: Emergency Medicine | Admitting: Emergency Medicine

## 2016-05-08 ENCOUNTER — Encounter (HOSPITAL_COMMUNITY): Payer: Self-pay | Admitting: Neurology

## 2016-05-08 ENCOUNTER — Emergency Department (HOSPITAL_COMMUNITY): Payer: Medicare Other

## 2016-05-08 DIAGNOSIS — Z87891 Personal history of nicotine dependence: Secondary | ICD-10-CM | POA: Diagnosis not present

## 2016-05-08 DIAGNOSIS — Z7901 Long term (current) use of anticoagulants: Secondary | ICD-10-CM | POA: Insufficient documentation

## 2016-05-08 DIAGNOSIS — N183 Chronic kidney disease, stage 3 (moderate): Secondary | ICD-10-CM | POA: Diagnosis not present

## 2016-05-08 DIAGNOSIS — R079 Chest pain, unspecified: Secondary | ICD-10-CM | POA: Diagnosis not present

## 2016-05-08 DIAGNOSIS — R0602 Shortness of breath: Secondary | ICD-10-CM

## 2016-05-08 DIAGNOSIS — I252 Old myocardial infarction: Secondary | ICD-10-CM | POA: Insufficient documentation

## 2016-05-08 DIAGNOSIS — E1122 Type 2 diabetes mellitus with diabetic chronic kidney disease: Secondary | ICD-10-CM | POA: Diagnosis not present

## 2016-05-08 DIAGNOSIS — Z794 Long term (current) use of insulin: Secondary | ICD-10-CM | POA: Insufficient documentation

## 2016-05-08 DIAGNOSIS — J449 Chronic obstructive pulmonary disease, unspecified: Secondary | ICD-10-CM | POA: Diagnosis not present

## 2016-05-08 DIAGNOSIS — Z955 Presence of coronary angioplasty implant and graft: Secondary | ICD-10-CM | POA: Insufficient documentation

## 2016-05-08 DIAGNOSIS — Z7984 Long term (current) use of oral hypoglycemic drugs: Secondary | ICD-10-CM | POA: Insufficient documentation

## 2016-05-08 DIAGNOSIS — Z8673 Personal history of transient ischemic attack (TIA), and cerebral infarction without residual deficits: Secondary | ICD-10-CM | POA: Insufficient documentation

## 2016-05-08 DIAGNOSIS — Z7982 Long term (current) use of aspirin: Secondary | ICD-10-CM | POA: Diagnosis not present

## 2016-05-08 DIAGNOSIS — I2510 Atherosclerotic heart disease of native coronary artery without angina pectoris: Secondary | ICD-10-CM | POA: Diagnosis not present

## 2016-05-08 DIAGNOSIS — I130 Hypertensive heart and chronic kidney disease with heart failure and stage 1 through stage 4 chronic kidney disease, or unspecified chronic kidney disease: Secondary | ICD-10-CM | POA: Insufficient documentation

## 2016-05-08 DIAGNOSIS — I509 Heart failure, unspecified: Secondary | ICD-10-CM | POA: Insufficient documentation

## 2016-05-08 DIAGNOSIS — Z79899 Other long term (current) drug therapy: Secondary | ICD-10-CM | POA: Diagnosis not present

## 2016-05-08 LAB — BASIC METABOLIC PANEL
ANION GAP: 10 (ref 5–15)
BUN: 24 mg/dL — ABNORMAL HIGH (ref 6–20)
CALCIUM: 9.6 mg/dL (ref 8.9–10.3)
CO2: 35 mmol/L — AB (ref 22–32)
CREATININE: 1.19 mg/dL — AB (ref 0.44–1.00)
Chloride: 97 mmol/L — ABNORMAL LOW (ref 101–111)
GFR calc Af Amer: 47 mL/min — ABNORMAL LOW (ref >60)
GFR, EST NON AFRICAN AMERICAN: 40 mL/min — AB (ref >60)
GLUCOSE: 136 mg/dL — AB (ref 65–99)
Potassium: 4.2 mmol/L (ref 3.5–5.1)
Sodium: 142 mmol/L (ref 135–145)

## 2016-05-08 LAB — CBC
HCT: 35.5 % — ABNORMAL LOW (ref 36.0–46.0)
HEMOGLOBIN: 10.9 g/dL — AB (ref 12.0–15.0)
MCH: 30.6 pg (ref 26.0–34.0)
MCHC: 30.7 g/dL (ref 30.0–36.0)
MCV: 99.7 fL (ref 78.0–100.0)
PLATELETS: 263 10*3/uL (ref 150–400)
RBC: 3.56 MIL/uL — ABNORMAL LOW (ref 3.87–5.11)
RDW: 12.5 % (ref 11.5–15.5)
WBC: 7.1 10*3/uL (ref 4.0–10.5)

## 2016-05-08 LAB — I-STAT TROPONIN, ED: TROPONIN I, POC: 0.04 ng/mL (ref 0.00–0.08)

## 2016-05-08 LAB — BRAIN NATRIURETIC PEPTIDE: B Natriuretic Peptide: 251.2 pg/mL — ABNORMAL HIGH (ref 0.0–100.0)

## 2016-05-08 LAB — PROTIME-INR
INR: 1.52
Prothrombin Time: 18.5 seconds — ABNORMAL HIGH (ref 11.4–15.2)

## 2016-05-08 MED ORDER — ONDANSETRON 4 MG PO TBDP: 4.0000 mg | ORAL_TABLET | Freq: Three times a day (TID) | ORAL | 3 refills | Status: AC | PRN

## 2016-05-08 NOTE — Discharge Instructions (Signed)
Today's workup without any significant findings. Trial of Zofran to see if it helps with the nausea that occurs overnight. Make an appointment to follow-up with your regular doctor. Keep your appointment with cardiology. Return for any new or worse symptoms.

## 2016-05-08 NOTE — ED Triage Notes (Signed)
Pt here c/o sob, cp since early morning today. Was recently discharged from hospital for similar sx. Also reports diarrhea x 1. Swelling in her legs. Pt is hard of hearing. Is supposed to be on oxygen 3-5 L all day, didn't bring her portable and was 69% on RA.

## 2016-05-08 NOTE — ED Provider Notes (Signed)
[place] DEPT Provider Note   CSN: [id number] Arrival date & time: 05/08/16  1216     History   Chief Complaint Chief Complaint  Patient presents with  . Shortness of Breath  . Chest Pain    HPI [name] is a 82 y.o. female.  Patient with admission for exacerbation of CHF the end of October was discharged home October 30. Patient returns today with concerns for shortness of breath. Also had some intermittent chest pain starting prior to midnight last night. Patient with some slight increased swelling in her legs. Patient is taking her diuretic as ordered. Patient has home oxygen but does not have portable oxygen. Whenever patients off oxygen oxygen saturation is good low. There are on oxygen oxygen saturation is fine. Patient appears nontoxic no acute distress.      Past Medical History:  Diagnosis Date  . Angina   . Anxiety   . Arthritis    "all over"  . Bleeds easily ([place])   . Bradycardia 08/24/2011  . CAD (coronary artery disease)    Cath in 2006 showed an occluded RCA with left-to-right collaterals & otherwise noncritical CAD with EF=25-30% at that time. EF subsequently improved to normal by 2D ECHO. Cath Aug. 2011 showed unchanged anatomy. > medical therapy recommended  . Carotid artery disease (HCC)    Moderate, right greater than left which we following by duplex ultrasound. [place] 12/05/11 = RIght Bulb/Prox ICA: Moderate to severe amt of fibrous plaque elevating velocities w/in prox segment of ICA. Consistent w/a 50-69% diameter reduction. Left Bulb/Prox ICA: Moderate amt of fibrous plaque slightly elevating velocities w/in the prox segment of the ICA. Consistent w/a 0-49% diameter reduction.   . Carotid bruit   . CHF (congestive heart failure) ([place])    Hospitalized 08/22/11-08/26/11 with CHF secondary to diastolic dysfunction & hospitilized in 07/2012 with a similar problem. TTE 08/23/11 = normal EF.  Marland Kitchen Chronic lower back pain   . COPD (chronic obstructive  pulmonary disease) (HCC)    GOLD 4 - PFT 06/08/10 FEV1 0.93 (62%), FEV1% 60, TLC 2.84 (69%0, DLCO 54%, +BD) On home O2.  Marland Kitchen COPD with emphysema ([place])   . Coronary atherosclerosis of unspecified type of vessel, native or graft   . Diastolic dysfunction    Grade II  . Edema   . Fall during current hospitalization    "was at [facility]; fell; transferred to [facility]" (01/13/2015)  . Family history of adverse reaction to anesthesia    "daughter gets PONV"  . GERD (gastroesophageal reflux disease)   . History of stress test 07/02/09   Mild perfusion defect seen in the Basal Inferior region(s). This is consistent with an infarct/scar.  Post-stress EF=56%. Global LV systolic function is normal.  EKG is negative for ischemia.  No significant iscemia detected. Low risk scan.  . Hyperlipidemia   . Hypertension   . Hypoxemic respiratory failure, chronic (HCC)    uses 2.5 liters of oxygen with sleep  . Lower extremity edema    ECHO 07/21/12 = EF 55-60%, performed for TIA. Responding to diurectics. Continue diuresis w/ a goal dry weight of <160lb. Venous Duplex 07/27/11 = Right lower extremity: no evidence of thrombus or thrombophlebitis. Essentially normal right lower extremity venous duplex Doppler evaluation.  . Mitral valve regurgitation    Mild to moderate by ECHO 07/21/12  . Myocardial infarction    "she's had several" (01/13/2015)  . On home oxygen therapy    "?L; prn" (01/13/2015)  . PAF (paroxysmal atrial fibrillation) (  [place])    In the past, on coumadin  . Pneumonia ~ 2013  . PONV (postoperative nausea and vomiting)   . RBBB (right bundle branch block)    Chronic  . Secondary pulmonary hypertension   . Shortness of breath   . Sleep apnea   . Sleep-related hypoventilation   . Stroke [facility])    "/CT scan; ~ 2014; didn't even know she'd had it"  (01/13/2015)  . Type II diabetes mellitus (HCC)    goal A1C is 8, to avoid hypoglycemia    Patient Active Problem List   Diagnosis Date Noted  . CKD stage  3 secondary to diabetes ([place]) 05/02/2016  . Atherosclerotic peripheral vascular disease ([place]) 11/04/2015  . Intertrochanteric fracture of left hip ([place])   . Neck pain   . Postoperative anemia due to acute blood loss   . Hip fracture requiring operative repair ([place]) 01/15/2015  . Preop cardiovascular exam 01/14/2015  . Hip fracture ([place]) 01/13/2015  . Osteopenia 01/05/2015  . Primary osteoarthritis involving multiple joints 06/12/2014  . CHF (congestive heart failure) ([place]) 12/28/2013  . Atypical chest pain 12/18/2013  . Upper airway cough syndrome 05/20/2013  . Pain in joint, ankle and foot 12/06/2012  . Olecranon bursitis of left elbow 08/26/2012  . Scalp lesion 08/26/2012  . Counseling regarding advanced directives 08/26/2012  . DM type 2 causing CKD stage 3 ([place]) 07/30/2012  . Sacral fracture, closed ([place]) 07/22/2012  . Ataxia 07/20/2012  . Weakness of both legs 07/20/2012  . Falls frequently 07/20/2012  . RBBB 08/25/2011  . CAD, remote RCA PCI, subsequently noted to be occluded, last cath 8/11 08/25/2011  . PVD (peripheral vascular disease), moderate carotid and LSCA disease 08/25/2011  . HTN (hypertension) 08/22/2011  . AK (actinic keratosis) 07/17/2011  . Shoulder pain 04/03/2011  . Gait instability 10/11/2010  . PAF, recurrance this admission, on chronic Amio/ Coumadin 07/06/2010  . COPD with emphysema ([place]) 06/16/2010  . Chronic respiratory failure with hypoxia ([place]) 06/07/2010  . Hyperlipidemia LDL goal <70 02/11/2010    Past Surgical History:  Procedure Laterality Date  . CARDIAC CATHETERIZATION  2006 & 2011   Cath in 2006 showed an occluded RCA with left-to-right collaterals & otherwise noncritical CAD with EF=25-30% at that time. EF subsequently improved to normal by 2D ECHO. Cath Aug. 2011 showed unchanged anatomy. > medical therapy recommended.  . Carotid Duplex  12/05/11   Moderate, right greater than left which we following by duplex ultrasound. [place] 12/05/11 =  RIght Bulb/Prox ICA: Moderate to severe amt of fibrous plaque elevating velocities w/in prox segment of ICA. Consistent w/a 50-69% diameter reduction. Left Bulb/Prox ICA: Moderate amt of fibrous plaque slightly elevating velocities w/in the prox segment of the ICA. Consistent w/a 0-49% diameter reduction.   Marland Kitchen CATARACT EXTRACTION W/ INTRAOCULAR LENS  IMPLANT, BILATERAL Bilateral   . CORONARY ANGIOPLASTY    . CORONARY ANGIOPLASTY WITH STENT PLACEMENT     x2  . FEMUR IM NAIL Left 01/15/2015   Procedure: INTRAMEDULLARY (IM) NAIL FEMORAL LEFT ;  Surgeon: Netta Cedars, MD;  Location: [place];  Service: Orthopedics;  Laterality: Left;  . SHOULDER OPEN ROTATOR CUFF REPAIR Right 07/2005  . TONSILLECTOMY    . TOTAL ABDOMINAL HYSTERECTOMY  ~ 1967  . TRANSVAGINAL TAPE (TVT) REMOVAL    . Venous Duplex  07/27/11   Venous Duplex 07/27/11 = Right lower extremity: no evidence of thrombus or thrombophlebitis. Essentially normal right lower extremity venous duplex Doppler evaluation.    OB  History    No data available       Home Medications    Prior to Admission medications   Medication Sig Start Date End Date Taking? Authorizing Provider  ACCU-CHEK AVIVA PLUS test strip Test tid. Dx E11.9 11/27/15  Yes Chipper Herb, MD  acetaminophen (TYLENOL) 500 MG tablet Take 500 mg by mouth every 4 (four) hours as needed for mild pain.    Yes Historical Provider, MD  Acetaminophen-Guaifenesin (CHEST CONGESTION/PAIN RELIEF PO) Take 1 tablet by mouth 2 (two) times daily.   Yes Historical Provider, MD  ALPRAZolam (XANAX) 0.25 MG tablet TAKE 1/2 TO 1 TABLET 2 TIMES A DAY AS NEEDED FOR ANXIETY 07/03/15  Yes Sharion Balloon, FNP  amiodarone (PACERONE) 200 MG tablet TAKE (1/2) TABLET DAILY. 05/04/16  Yes Lorretta Harp, MD  aspirin EC 81 MG tablet Take 81 mg by mouth daily.   Yes Historical Provider, MD  atorvastatin (LIPITOR) 20 MG tablet TAKE 1 TABLET DAILY IN THE EVENING 05/04/16  Yes Chipper Herb, MD  Azelastine HCl 0.15 %  SOLN Place 1 spray into both nostrils 2 (two) times daily as needed (congestion).   Yes Historical Provider, MD  baclofen (LIORESAL) 10 MG tablet TAKE 1/2 TABLET EVERY 8 HOURS AS NEEDED FOR MUSCLE SPASM 12/31/15  Yes Chipper Herb, MD  budesonide-formoterol [facility]) 160-4.5 MCG/ACT inhaler Inhale 1 puff into the lungs 2 (two) times daily. Patient taking differently: Inhale 1 puff into the lungs 2 (two) times daily as needed (wheezing).  01/24/14  Yes Chipper Herb, MD  cholecalciferol (VITAMIN D) 1000 units tablet Take 1,000 Units by mouth daily.   Yes Historical Provider, MD  docusate sodium (COLACE) 100 MG capsule Take 100 mg by mouth daily as needed.    Yes Historical Provider, MD  ferrous sulfate 325 (65 FE) MG tablet TAKE 1 TABLET ONCE DAILY WITH BREAKFAST 02/22/16  Yes Chipper Herb, MD  fluticasone [facility]) 50 MCG/ACT nasal spray Place 1 spray into both nostrils 2 (two) times daily. Patient taking differently: Place 1 spray into both nostrils as needed.  12/24/13  Yes Chesley Mires, MD  glucosamine-chondroitin 500-400 MG tablet Take 1 tablet by mouth 2 (two) times daily.   Yes Historical Provider, MD  HUMALOG KWIKPEN 100 UNIT/ML KiwkPen INJECT UP TO 8 UNITS BEFORE MEALS AS DIRECTED BY SLIDING SCALE. MAX 24 UNITS PER DAY 04/07/15  Yes Fransisca Kaufmann Dettinger, MD  hydrocortisone (ANUSOL-HC) 25 MG suppository Place 1 suppository (25 mg total) rectally 2 (two) times daily as needed for hemorrhoids or itching. 07/30/15  Yes Fransisca Kaufmann Dettinger, MD  Insulin Pen Needle (PEN NEEDLES) 31G X 6 MM MISC Use to administer insulin up to qid 05/25/15  Yes Chipper Herb, MD  Ipratropium-Albuterol (COMBIVENT) 20-100 MCG/ACT AERS respimat Inhale 1 puff into the lungs every 6 (six) hours as needed for wheezing or shortness of breath. 01/24/14  Yes Chipper Herb, MD  LEVEMIR FLEXTOUCH 100 UNIT/ML Pen INJECT UP TO 20 UNITS SQ EVERY MORNING AS DIRECTED Patient taking differently: INJECT UP TO 8 UNITS SQ EVERY MORNING AS  DIRECTED 05/04/16  Yes Chipper Herb, MD  metoprolol succinate (TOPROL-XL) 25 MG 24 hr tablet TAKE (1/2) TABLET DAILY. 05/04/16  Yes Chipper Herb, MD  Multiple Vitamins-Minerals (PRESERVISION/LUTEIN) CAPS Take 1 capsule by mouth daily.    Yes Historical Provider, MD  NIFEdipine (PROCARDIA-XL/ADALAT-CC/NIFEDICAL-XL) 30 MG 24 hr tablet TAKE 1 TABLET ONCE DAILY 05/04/16  Yes Lorretta Harp, MD  nitroGLYCERIN (NITROSTAT) 0.4 MG SL tablet Place 1 tablet (0.4 mg total) under the tongue every 5 (five) minutes as needed for chest pain. 05/09/14  Yes Mary-Margaret Hassell Done, FNP  OXYGEN Inhale 2.5 L into the lungs daily.   Yes Historical Provider, MD  pantoprazole (PROTONIX) 40 MG tablet Take 1 tablet (40 mg total) by mouth daily. 08/12/15  Yes Chipper Herb, MD  polyethylene glycol powder [facility]) powder Take 17 g by mouth daily as needed. Patient taking differently: Take 17 g by mouth daily as needed for mild constipation.  07/30/15  Yes Fransisca Kaufmann Dettinger, MD  potassium chloride SA (K-DUR,KLOR-CON) 20 MEQ tablet TAKE 1 TABLET DAILY 02/22/16  Yes Chipper Herb, MD  PRODIGY TWIST TOP LANCETS 28G MISC CHECK BLOOD SUGAR UP TO 4 TIMES A DAY 05/19/15  Yes Chipper Herb, MD  simethicone (MYLICON) 80 MG chewable tablet Chew 80 mg by mouth at bedtime as needed for flatulence.   Yes Historical Provider, MD  torsemide (DEMADEX) 20 MG tablet Take 1 tablet (20 mg total) by mouth 2 (two) times daily. 05/04/16  Yes Timmothy Euler, MD  vitamin C (ASCORBIC ACID) 500 MG tablet Take 500 mg by mouth at bedtime.    Yes Historical Provider, MD  warfarin (COUMADIN) 4 MG tablet Take 1 tablet (4 mg total) by mouth daily. Please start new dose with next pill box fill. 05/02/16  Yes Cherre Robins, PharmD  budesonide-formoterol (SYMBICORT) 160-4.5 MCG/ACT inhaler Inhale 2 puffs into the lungs 2 (two) times daily. 04/26/16 04/27/16  Chesley Mires, MD  ondansetron (ZOFRAN ODT) 4 MG disintegrating tablet Take 1 tablet (4 mg total)  by mouth every 8 (eight) hours as needed for nausea or vomiting. 05/08/16   Fredia Sorrow, MD  traMADol (ULTRAM) 50 MG tablet Take 1 tablet (50 mg total) by mouth every 6 (six) hours as needed for severe pain. Patient not taking: Reported on 05/08/2016 12/26/15   [place]ianne Rice, MD    Family History Family History  Problem Relation Age of Onset  . Cancer Mother     Skin  . Heart disease Mother   . Emphysema Father   . Heart disease Father   . Congestive Heart Failure Father   . Heart disease Brother   . Heart disease Brother   . Early death Sister     Social History Social History  Substance Use Topics  . Smoking status: Former Smoker    Packs/day: 1.50    Years: 15.00    Types: Cigarettes  . Smokeless tobacco: Never Used     Comment: Smoked 1.5 packs per day for 15 years, quit in 1995.  Marland Kitchen Alcohol use 3.6 oz/week    6 Glasses of wine per week     Comment: 01/13/2015 "couple glasses of wine maybe 3 days/wk"     Allergies   Atorvastatin; Codeine; Morphine; Other; Rosuvastatin; and Iohexol   Review of Systems Review of Systems  Constitutional: Negative for fever.  HENT: Negative for congestion.   Eyes: Negative for redness.  Respiratory: Positive for shortness of breath.   Cardiovascular: Positive for chest pain and leg swelling.  Gastrointestinal: Negative for abdominal pain.  Genitourinary: Negative for dysuria.  Musculoskeletal: Negative for back pain.  Skin: Negative for rash.  Neurological: Negative for headaches.  Hematological: Does not bruise/bleed easily.  Psychiatric/Behavioral: Negative for confusion.     Physical Exam Updated Vital Signs BP 171/74 (BP Location: Right Arm)   Pulse 73   Temp 97.7 F (36.5  C) (Oral)   Resp 18   SpO2 97%   Physical Exam  Constitutional: She is oriented to person, place, and time. She appears well-developed and well-nourished. No distress.  HENT:  Head: Normocephalic and atraumatic.  Mouth/Throat: Oropharynx is  clear and moist.  Eyes: Conjunctivae and EOM are normal. Pupils are equal, round, and reactive to light.  Neck: Normal range of motion. Neck supple.  Cardiovascular: Normal rate, regular rhythm and normal heart sounds.   Pulmonary/Chest: Effort normal and breath sounds normal. No respiratory distress. She has no wheezes. She has no rales.  Abdominal: Soft. Bowel sounds are normal. There is no tenderness.  Musculoskeletal: Normal range of motion. She exhibits edema.  Neurological: She is alert and oriented to person, place, and time. No cranial nerve deficit.  Skin: Skin is warm.  Nursing note and vitals reviewed.    ED Treatments / Results  Labs (all labs ordered are listed, but only abnormal results are displayed) Labs Reviewed  BASIC METABOLIC PANEL - Abnormal; Notable for the following:       Result Value   Chloride 97 (*)    CO2 35 (*)    Glucose, Bld 136 (*)    BUN 24 (*)    Creatinine, Ser 1.19 (*)    GFR calc non Af Amer 40 (*)    GFR calc Af Amer 47 (*)    All other components within normal limits  CBC - Abnormal; Notable for the following:    RBC 3.56 (*)    Hemoglobin 10.9 (*)    HCT 35.5 (*)    All other components within normal limits  PROTIME-INR - Abnormal; Notable for the following:    Prothrombin Time 18.5 (*)    All other components within normal limits  BRAIN NATRIURETIC PEPTIDE - Abnormal; Notable for the following:    B Natriuretic Peptide 251.2 (*)    All other components within normal limits  I-STAT TROPOININ, ED    EKG  EKG Interpretation  Date/Time:  Sunday May 08 2016 12:30:41 EST Ventricular Rate:  64 PR Interval:    QRS Duration: 147 QT Interval:  490 QTC Calculation: 506 R Axis:   97 Text Interpretation:  Sinus rhythm Prolonged PR interval RBBB and LPFB Interpretation limited secondary to artifact No significant change since last tracing Confirmed by Clemencia Helzer  MD, Girard Koontz [phone]) on 05/08/2016 12:44:01 PM       Radiology Dg  Chest 2 View  Result Date: 05/08/2016 CLINICAL DATA:  Chest pain. EXAM: CHEST  2 VIEW COMPARISON:  April 24, 2016 FINDINGS: Stable cardiomegaly. Mild edema. No other interval changes or acute abnormalities. IMPRESSION: Cardiomegaly with mild edema. Electronically Signed   By: Dorise Bullion III M.D   On: 05/08/2016 13:59    Procedures Procedures (including critical care time)  Medications Ordered in ED Medications - No data to display   Initial Impression / Assessment and Plan / ED Course  I have reviewed the triage vital signs and the nursing notes.  Pertinent labs & imaging results that were available during my care of the patient were reviewed by me and considered in my medical decision making (see chart for details).  Clinical Course     Patient with recent admission the end of October for exacerbation of CHF. Patient with increased shortness of breath. And also with the some intermittent chest pain that started overnight. Workup for both without any significant findings. Patient's chest x-ray does show some mild edema but nothing significant BMPs improved.  Electrolytes without significant abnormalities. Patient does have follow-up with congestive heart failure clinic. Patient stable for discharge home. Patient's other complaint was nausea that started over night for the past several days. We'll give a trial of Zofran for that. Patient's oxygen saturations have been fine here.  Final Clinical Impressions(s) / ED Diagnoses   Final diagnoses:  SOB (shortness of breath)  Chronic congestive heart failure, unspecified congestive heart failure type (HCC)    New Prescriptions New Prescriptions   ONDANSETRON (ZOFRAN ODT) 4 MG DISINTEGRATING TABLET    Take 1 tablet (4 mg total) by mouth every 8 (eight) hours as needed for nausea or vomiting.     Fredia Sorrow, MD 05/08/16 [phone]

## 2016-05-10 ENCOUNTER — Ambulatory Visit: Payer: Medicare Other | Admitting: Family Medicine

## 2016-05-13 ENCOUNTER — Ambulatory Visit (INDEPENDENT_AMBULATORY_CARE_PROVIDER_SITE_OTHER): Payer: Medicare Other | Admitting: Family Medicine

## 2016-05-13 ENCOUNTER — Encounter: Payer: Self-pay | Admitting: Family Medicine

## 2016-05-13 VITALS — BP 109/58 | HR 68 | Temp 96.6°F | Ht 61.52 in | Wt 122.2 lb

## 2016-05-13 DIAGNOSIS — I480 Paroxysmal atrial fibrillation: Secondary | ICD-10-CM

## 2016-05-13 DIAGNOSIS — I509 Heart failure, unspecified: Secondary | ICD-10-CM

## 2016-05-13 DIAGNOSIS — J449 Chronic obstructive pulmonary disease, unspecified: Secondary | ICD-10-CM | POA: Diagnosis not present

## 2016-05-13 DIAGNOSIS — J439 Emphysema, unspecified: Secondary | ICD-10-CM

## 2016-05-13 DIAGNOSIS — I259 Chronic ischemic heart disease, unspecified: Secondary | ICD-10-CM | POA: Diagnosis not present

## 2016-05-13 LAB — COAGUCHEK XS/INR WAIVED
INR: 3.2 — ABNORMAL HIGH (ref 0.9–1.1)
Prothrombin Time: 38.3 s

## 2016-05-13 NOTE — Patient Instructions (Signed)
Great to see you!  Lets plan to have you follow up with Dr. Laurance Flatten as planned unless you need [place] sooner.   I have placed a heart failure clinic referral.

## 2016-05-13 NOTE — Progress Notes (Signed)
   HPI  Patient presents today for emergency room follow-up.  Patient was seen in the emergency room for dyspnea likely due to CHF and COPD. She is also treated for nausea with Zofran. She states that since getting home she feels better, however she saw a friend of hers fall one day ago which made her very sad, she's had some increased weakness since that time.  Her breathing is back at baseline. She's getting a portable oxygen concentrator which she is excited about.  She and her daughter requests referral to the CHF clinic.  PMH: Smoking status noted ROS: Per HPI  Objective: BP (!) 109/58   Pulse 68   Temp (!) 96.6 F (35.9 C) (Oral)   Ht 5' 1.52" (1.563 m)   Wt 122 lb 3.2 oz (55.4 kg)   BMI 22.70 kg/m  Gen: NAD, alert, cooperative with exam HEENT: NCAT, EOMI, PERRL CV: RRR, good S1/S2, no murmur Resp: CTABL, no wheezes, non-labored Abd: SNTND, BS present, no guarding or organomegaly Ext: No edema, warm Neuro: Alert and oriented, No gross deficits  Assessment and plan:  # CHF Dyspnea is improved, her weight is stable from our last visit, at which time I felt that she was hypovolemic. However this time she's breathing much better. She reports good diuresis with torsemide Dry weight is likely closer to 118. Refer to CHF No changes in diuresis currently.   # COPD, pulmonary emphysema Breathing is at baseline, she is excited about her portable oxygen   # Atrial fibrillation, and chronic anticoagulation Coumadin actually being held today for 5 days for oral surgery next week INR is slightly thin, 3.2 Following up with clinical pharmacist one week after restarting Coumadin next week    Orders Placed This Encounter  Procedures  . AMB referral to CHF clinic    Referral Priority:   Routine    Referral Type:   Consultation    Number of Visits Requested:   [place], MD [facility] 05/13/2016, 4:08 PM

## 2016-05-14 DIAGNOSIS — R0689 Other abnormalities of breathing: Secondary | ICD-10-CM | POA: Diagnosis not present

## 2016-05-14 DIAGNOSIS — R0902 Hypoxemia: Secondary | ICD-10-CM | POA: Diagnosis not present

## 2016-05-14 DIAGNOSIS — J449 Chronic obstructive pulmonary disease, unspecified: Secondary | ICD-10-CM | POA: Diagnosis not present

## 2016-05-16 ENCOUNTER — Encounter (INDEPENDENT_AMBULATORY_CARE_PROVIDER_SITE_OTHER): Payer: Medicare Other | Admitting: Ophthalmology

## 2016-05-16 DIAGNOSIS — I10 Essential (primary) hypertension: Secondary | ICD-10-CM | POA: Diagnosis not present

## 2016-05-16 DIAGNOSIS — H353134 Nonexudative age-related macular degeneration, bilateral, advanced atrophic with subfoveal involvement: Secondary | ICD-10-CM | POA: Diagnosis not present

## 2016-05-16 DIAGNOSIS — H43813 Vitreous degeneration, bilateral: Secondary | ICD-10-CM | POA: Diagnosis not present

## 2016-05-16 DIAGNOSIS — E113393 Type 2 diabetes mellitus with moderate nonproliferative diabetic retinopathy without macular edema, bilateral: Secondary | ICD-10-CM

## 2016-05-16 DIAGNOSIS — E11319 Type 2 diabetes mellitus with unspecified diabetic retinopathy without macular edema: Secondary | ICD-10-CM | POA: Diagnosis not present

## 2016-05-16 DIAGNOSIS — H35033 Hypertensive retinopathy, bilateral: Secondary | ICD-10-CM | POA: Diagnosis not present

## 2016-05-17 ENCOUNTER — Encounter (HOSPITAL_COMMUNITY): Payer: Self-pay | Admitting: *Deleted

## 2016-05-17 ENCOUNTER — Other Ambulatory Visit: Payer: Self-pay | Admitting: *Deleted

## 2016-05-17 ENCOUNTER — Emergency Department (HOSPITAL_COMMUNITY)
Admission: EM | Admit: 2016-05-17 | Discharge: 2016-05-17 | Disposition: A | Discharge status: Home / Self Care | Payer: Medicare Other | Attending: Emergency Medicine | Admitting: Emergency Medicine

## 2016-05-17 DIAGNOSIS — Z8673 Personal history of transient ischemic attack (TIA), and cerebral infarction without residual deficits: Secondary | ICD-10-CM | POA: Insufficient documentation

## 2016-05-17 DIAGNOSIS — Z794 Long term (current) use of insulin: Secondary | ICD-10-CM | POA: Insufficient documentation

## 2016-05-17 DIAGNOSIS — R600 Localized edema: Secondary | ICD-10-CM | POA: Insufficient documentation

## 2016-05-17 DIAGNOSIS — I130 Hypertensive heart and chronic kidney disease with heart failure and stage 1 through stage 4 chronic kidney disease, or unspecified chronic kidney disease: Secondary | ICD-10-CM | POA: Diagnosis not present

## 2016-05-17 DIAGNOSIS — I2510 Atherosclerotic heart disease of native coronary artery without angina pectoris: Secondary | ICD-10-CM | POA: Insufficient documentation

## 2016-05-17 DIAGNOSIS — E1122 Type 2 diabetes mellitus with diabetic chronic kidney disease: Secondary | ICD-10-CM | POA: Diagnosis not present

## 2016-05-17 DIAGNOSIS — R609 Edema, unspecified: Secondary | ICD-10-CM | POA: Diagnosis not present

## 2016-05-17 DIAGNOSIS — Z955 Presence of coronary angioplasty implant and graft: Secondary | ICD-10-CM | POA: Diagnosis not present

## 2016-05-17 DIAGNOSIS — N183 Chronic kidney disease, stage 3 (moderate): Secondary | ICD-10-CM | POA: Insufficient documentation

## 2016-05-17 DIAGNOSIS — I252 Old myocardial infarction: Secondary | ICD-10-CM | POA: Insufficient documentation

## 2016-05-17 DIAGNOSIS — K5900 Constipation, unspecified: Secondary | ICD-10-CM | POA: Insufficient documentation

## 2016-05-17 DIAGNOSIS — R0682 Tachypnea, not elsewhere classified: Secondary | ICD-10-CM | POA: Diagnosis not present

## 2016-05-17 DIAGNOSIS — Z87891 Personal history of nicotine dependence: Secondary | ICD-10-CM | POA: Diagnosis not present

## 2016-05-17 DIAGNOSIS — J449 Chronic obstructive pulmonary disease, unspecified: Secondary | ICD-10-CM | POA: Diagnosis not present

## 2016-05-17 DIAGNOSIS — Z7901 Long term (current) use of anticoagulants: Secondary | ICD-10-CM | POA: Insufficient documentation

## 2016-05-17 DIAGNOSIS — I509 Heart failure, unspecified: Secondary | ICD-10-CM | POA: Insufficient documentation

## 2016-05-17 DIAGNOSIS — Z7982 Long term (current) use of aspirin: Secondary | ICD-10-CM | POA: Insufficient documentation

## 2016-05-17 MED ORDER — POLYETHYLENE GLYCOL 3350 17 G PO PACK: 17.0000 g | PACK | Freq: Two times a day (BID) | ORAL | 0 refills | Status: AC | PRN

## 2016-05-17 MED ORDER — POTASSIUM CHLORIDE CRYS ER 20 MEQ PO TBCR
40.0000 meq | EXTENDED_RELEASE_TABLET | Freq: Once | ORAL | Status: AC
  Administered 2016-05-17: 40 meq via ORAL
  Filled 2016-05-17: qty 2

## 2016-05-17 MED ORDER — POLYETHYLENE GLYCOL 3350 17 G PO PACK
34.0000 g | PACK | Freq: Every day | ORAL | Status: DC
  Administered 2016-05-17: 34 g via ORAL
  Filled 2016-05-17: qty 2

## 2016-05-17 MED ORDER — FUROSEMIDE 40 MG PO TABS
40.0000 mg | ORAL_TABLET | Freq: Once | ORAL | Status: AC
  Administered 2016-05-17: 40 mg via ORAL
  Filled 2016-05-17: qty 1

## 2016-05-17 MED ORDER — TORSEMIDE 20 MG PO TABS: 20.0000 mg | ORAL_TABLET | Freq: Every day | ORAL | 0 refills | Status: DC | PRN

## 2016-05-17 NOTE — ED Provider Notes (Signed)
[place] DEPT Provider Note   CSN: [id number] Arrival date & time: 05/17/16  1341     History   Chief Complaint Chief Complaint  Patient presents with  . Constipation  . Leg Swelling    HPI [name] is a 82 y.o. female.  HPI   88yF with LE edema and constipation.She is really a pleasure to talk to.  Recent admission at the end of October for CHF exacerbation. She reports some increased LE swelling in the past couple days and is concerned about this. She denies new dyspnea. No acute pain no orthopnea. She reports compliance with her medications. She keeps her legs elevated on an ottoman at home when she is off of them.   She is also c/o constipation. Normally very regular but has not had a BM in three days. Has tried prune juice ad stool softeners w/o resut    Past Medical History:  Diagnosis Date  . Angina   . Anxiety   . Arthritis    "all over"  . Bleeds easily ([place])   . Bradycardia 08/24/2011  . CAD (coronary artery disease)    Cath in 2006 showed an occluded RCA with left-to-right collaterals & otherwise noncritical CAD with EF=25-30% at that time. EF subsequently improved to normal by 2D ECHO. Cath Aug. 2011 showed unchanged anatomy. > medical therapy recommended  . Carotid artery disease (HCC)    Moderate, right greater than left which we following by duplex ultrasound. [place] 12/05/11 = RIght Bulb/Prox ICA: Moderate to severe amt of fibrous plaque elevating velocities w/in prox segment of ICA. Consistent w/a 50-69% diameter reduction. Left Bulb/Prox ICA: Moderate amt of fibrous plaque slightly elevating velocities w/in the prox segment of the ICA. Consistent w/a 0-49% diameter reduction.   . Carotid bruit   . CHF (congestive heart failure) ([place])    Hospitalized 08/22/11-08/26/11 with CHF secondary to diastolic dysfunction & hospitilized in 07/2012 with a similar problem. TTE 08/23/11 = normal EF.  Marland Kitchen Chronic lower back pain   . COPD (chronic obstructive pulmonary  disease) (HCC)    GOLD 4 - PFT 06/08/10 FEV1 0.93 (62%), FEV1% 60, TLC 2.84 (69%0, DLCO 54%, +BD) On home O2.  Marland Kitchen COPD with emphysema ([place])   . Coronary atherosclerosis of unspecified type of vessel, native or graft   . Diastolic dysfunction    Grade II  . Edema   . Fall during current hospitalization    "was at [facility]; fell; transferred to [facility]" (01/13/2015)  . Family history of adverse reaction to anesthesia    "daughter gets PONV"  . GERD (gastroesophageal reflux disease)   . History of stress test 07/02/09   Mild perfusion defect seen in the Basal Inferior region(s). This is consistent with an infarct/scar.  Post-stress EF=56%. Global LV systolic function is normal.  EKG is negative for ischemia.  No significant iscemia detected. Low risk scan.  . Hyperlipidemia   . Hypertension   . Hypoxemic respiratory failure, chronic (HCC)    uses 2.5 liters of oxygen with sleep  . Lower extremity edema    ECHO 07/21/12 = EF 55-60%, performed for TIA. Responding to diurectics. Continue diuresis w/ a goal dry weight of <160lb. Venous Duplex 07/27/11 = Right lower extremity: no evidence of thrombus or thrombophlebitis. Essentially normal right lower extremity venous duplex Doppler evaluation.  . Mitral valve regurgitation    Mild to moderate by ECHO 07/21/12  . Myocardial infarction    "she's had several" (01/13/2015)  . On home oxygen therapy    "?  L; prn" (01/13/2015)  . PAF (paroxysmal atrial fibrillation) (HCC)    In the past, on coumadin  . Pneumonia ~ 2013  . PONV (postoperative nausea and vomiting)   . RBBB (right bundle branch block)    Chronic  . Secondary pulmonary hypertension   . Shortness of breath   . Sleep apnea   . Sleep-related hypoventilation   . Stroke [facility])    "/CT scan; ~ 2014; didn't even know she'd had it"  (01/13/2015)  . Type II diabetes mellitus (HCC)    goal A1C is 8, to avoid hypoglycemia    Patient Active Problem List   Diagnosis Date Noted  . CKD stage 3  secondary to diabetes ([place]) 05/02/2016  . Atherosclerotic peripheral vascular disease ([place]) 11/04/2015  . Intertrochanteric fracture of left hip ([place])   . Neck pain   . Postoperative anemia due to acute blood loss   . Hip fracture requiring operative repair ([place]) 01/15/2015  . Preop cardiovascular exam 01/14/2015  . Hip fracture ([place]) 01/13/2015  . Osteopenia 01/05/2015  . Primary osteoarthritis involving multiple joints 06/12/2014  . CHF (congestive heart failure) ([place]) 12/28/2013  . Atypical chest pain 12/18/2013  . Upper airway cough syndrome 05/20/2013  . Pain in joint, ankle and foot 12/06/2012  . Olecranon bursitis of left elbow 08/26/2012  . Scalp lesion 08/26/2012  . Counseling regarding advanced directives 08/26/2012  . DM type 2 causing CKD stage 3 ([place]) 07/30/2012  . Sacral fracture, closed ([place]) 07/22/2012  . Ataxia 07/20/2012  . Weakness of both legs 07/20/2012  . Falls frequently 07/20/2012  . RBBB 08/25/2011  . CAD, remote RCA PCI, subsequently noted to be occluded, last cath 8/11 08/25/2011  . PVD (peripheral vascular disease), moderate carotid and LSCA disease 08/25/2011  . HTN (hypertension) 08/22/2011  . AK (actinic keratosis) 07/17/2011  . Shoulder pain 04/03/2011  . Gait instability 10/11/2010  . PAF, recurrance this admission, on chronic Amio/ Coumadin 07/06/2010  . COPD with emphysema ([place]) 06/16/2010  . Chronic respiratory failure with hypoxia ([place]) 06/07/2010  . Hyperlipidemia LDL goal <70 02/11/2010    Past Surgical History:  Procedure Laterality Date  . CARDIAC CATHETERIZATION  2006 & 2011   Cath in 2006 showed an occluded RCA with left-to-right collaterals & otherwise noncritical CAD with EF=25-30% at that time. EF subsequently improved to normal by 2D ECHO. Cath Aug. 2011 showed unchanged anatomy. > medical therapy recommended.  . Carotid Duplex  12/05/11   Moderate, right greater than left which we following by duplex ultrasound. [place] 12/05/11 = RIght  Bulb/Prox ICA: Moderate to severe amt of fibrous plaque elevating velocities w/in prox segment of ICA. Consistent w/a 50-69% diameter reduction. Left Bulb/Prox ICA: Moderate amt of fibrous plaque slightly elevating velocities w/in the prox segment of the ICA. Consistent w/a 0-49% diameter reduction.   Marland Kitchen CATARACT EXTRACTION W/ INTRAOCULAR LENS  IMPLANT, BILATERAL Bilateral   . CORONARY ANGIOPLASTY    . CORONARY ANGIOPLASTY WITH STENT PLACEMENT     x2  . FEMUR IM NAIL Left 01/15/2015   Procedure: INTRAMEDULLARY (IM) NAIL FEMORAL LEFT ;  Surgeon: Netta Cedars, MD;  Location: [place];  Service: Orthopedics;  Laterality: Left;  . SHOULDER OPEN ROTATOR CUFF REPAIR Right 07/2005  . TONSILLECTOMY    . TOTAL ABDOMINAL HYSTERECTOMY  ~ 1967  . TRANSVAGINAL TAPE (TVT) REMOVAL    . Venous Duplex  07/27/11   Venous Duplex 07/27/11 = Right lower extremity: no evidence of thrombus or thrombophlebitis. Essentially normal right lower  extremity venous duplex Doppler evaluation.    OB History    No data available       Home Medications    Prior to Admission medications   Medication Sig Start Date End Date Taking? Authorizing Provider  ACCU-CHEK AVIVA PLUS test strip Test tid. Dx E11.9 11/27/15  Yes Chipper Herb, MD  amiodarone (PACERONE) 200 MG tablet TAKE (1/2) TABLET DAILY. Patient taking differently: Take 100 mg in the morning 05/04/16  Yes Lorretta Harp, MD  aspirin EC 81 MG tablet Take 81 mg by mouth daily with breakfast.    Yes Historical Provider, MD  atorvastatin (LIPITOR) 20 MG tablet TAKE 1 TABLET DAILY IN THE EVENING Patient taking differently: TAKE 20 mg by mouth at bedtime 05/04/16  Yes Chipper Herb, MD  ferrous sulfate 325 (65 FE) MG tablet TAKE 1 TABLET ONCE DAILY WITH BREAKFAST 02/22/16  Yes Chipper Herb, MD  furosemide (LASIX) 20 MG tablet Take 20 mg by mouth daily as needed for fluid.   Yes Historical Provider, MD  guaifenesin (HUMIBID E) 400 MG TABS tablet Take 600 mg by mouth 2 (two)  times daily.   Yes Historical Provider, MD  Insulin Pen Needle (PEN NEEDLES) 31G X 6 MM MISC Use to administer insulin up to qid 05/25/15  Yes Chipper Herb, MD  LEVEMIR FLEXTOUCH 100 UNIT/ML Pen INJECT UP TO 20 UNITS SQ EVERY MORNING AS DIRECTED 05/04/16  Yes Chipper Herb, MD  metoprolol succinate (TOPROL-XL) 25 MG 24 hr tablet TAKE (1/2) TABLET DAILY. Patient taking differently: Take 25mg  by mouth in the morning 05/04/16  Yes Chipper Herb, MD  Multiple Vitamins-Minerals (CENTRUM ADULTS) TABS Take 1 tablet by mouth daily with breakfast.   Yes Historical Provider, MD  Multiple Vitamins-Minerals (PRESERVISION AREDS) CAPS Take 1 capsule by mouth daily with breakfast.   Yes Historical Provider, MD  NIFEdipine (PROCARDIA-XL/ADALAT-CC/NIFEDICAL-XL) 30 MG 24 hr tablet TAKE 1 TABLET ONCE DAILY Patient taking differently: Take 30 mg by mouth in the morning 05/04/16  Yes Lorretta Harp, MD  pantoprazole (PROTONIX) 40 MG tablet Take 1 tablet (40 mg total) by mouth daily. Patient taking differently: Take 40 mg by mouth daily with breakfast.  08/12/15  Yes Chipper Herb, MD  potassium chloride SA (K-DUR,KLOR-CON) 20 MEQ tablet TAKE 1 TABLET DAILY Patient taking differently: Take 20 mEq by mouth in the morning 02/22/16  Yes Chipper Herb, MD  PRODIGY TWIST TOP LANCETS 28G MISC CHECK BLOOD SUGAR UP TO 4 TIMES A DAY 05/19/15  Yes Chipper Herb, MD  torsemide (DEMADEX) 20 MG tablet Take 1 tablet (20 mg total) by mouth 2 (two) times daily. 05/04/16  Yes Timmothy Euler, MD  warfarin (COUMADIN) 4 MG tablet Take 1 tablet (4 mg total) by mouth daily. Please start new dose with next pill box fill. Patient taking differently: Take 4 mg by mouth daily with breakfast. Please start new dose with next pill box fill. 05/02/16  Yes Cherre Robins, PharmD  acetaminophen (TYLENOL) 500 MG tablet Take 500 mg by mouth every 4 (four) hours as needed for mild pain.     Historical Provider, MD  ALPRAZolam (XANAX) 0.25 MG tablet  TAKE 1/2 TO 1 TABLET 2 TIMES A DAY AS NEEDED FOR ANXIETY 07/03/15   Sharion Balloon, FNP  Azelastine HCl 0.15 % SOLN Place 1 spray into both nostrils 2 (two) times daily as needed (congestion).    Historical Provider, MD  baclofen (LIORESAL) 10 MG tablet  TAKE 1/2 TABLET EVERY 8 HOURS AS NEEDED FOR MUSCLE SPASM 12/31/15   Chipper Herb, MD  budesonide-formoterol [facility]) 160-4.5 MCG/ACT inhaler Inhale 1 puff into the lungs 2 (two) times daily. Patient taking differently: Inhale 1 puff into the lungs 2 (two) times daily as needed (wheezing).  01/24/14   Chipper Herb, MD  budesonide-formoterol [facility]) 160-4.5 MCG/ACT inhaler Inhale 2 puffs into the lungs 2 (two) times daily. 04/26/16 04/27/16  Chesley Mires, MD  cholecalciferol (VITAMIN D) 1000 units tablet Take 1,000 Units by mouth daily.    Historical Provider, MD  docusate sodium (COLACE) 100 MG capsule Take 100 mg by mouth daily as needed.     Historical Provider, MD  fluticasone (FLONASE) 50 MCG/ACT nasal spray Place 1 spray into both nostrils 2 (two) times daily. Patient taking differently: Place 1 spray into both nostrils as needed.  12/24/13   Chesley Mires, MD  glucosamine-chondroitin 500-400 MG tablet Take 1 tablet by mouth 2 (two) times daily.    Historical Provider, MD  HUMALOG KWIKPEN 100 UNIT/ML KiwkPen INJECT UP TO 8 UNITS BEFORE MEALS AS DIRECTED BY SLIDING SCALE. MAX 24 UNITS PER DAY 04/07/15   Fransisca Kaufmann Dettinger, MD  hydrocortisone (ANUSOL-HC) 25 MG suppository Place 1 suppository (25 mg total) rectally 2 (two) times daily as needed for hemorrhoids or itching. 07/30/15   Fransisca Kaufmann Dettinger, MD  Ipratropium-Albuterol (COMBIVENT) 20-100 MCG/ACT AERS respimat Inhale 1 puff into the lungs every 6 (six) hours as needed for wheezing or shortness of breath. 01/24/14   Chipper Herb, MD  nitroGLYCERIN (NITROSTAT) 0.4 MG SL tablet Place 1 tablet (0.4 mg total) under the tongue every 5 (five) minutes as needed for chest pain. 05/09/14   Mary-Margaret  Hassell Done, FNP  ondansetron (ZOFRAN ODT) 4 MG disintegrating tablet Take 1 tablet (4 mg total) by mouth every 8 (eight) hours as needed for nausea or vomiting. 05/08/16   Fredia Sorrow, MD  OXYGEN Inhale 2.5 L into the lungs daily.    Historical Provider, MD  polyethylene glycol powder (GLYCOLAX/MIRALAX) powder Take 17 g by mouth daily as needed. Patient taking differently: Take 17 g by mouth daily as needed for mild constipation.  07/30/15   Fransisca Kaufmann Dettinger, MD  simethicone (MYLICON) 80 MG chewable tablet Chew 80 mg by mouth at bedtime as needed for flatulence.    Historical Provider, MD  traMADol (ULTRAM) 50 MG tablet Take 1 tablet (50 mg total) by mouth every 6 (six) hours as needed for severe pain. 12/26/15   Julianne Rice, MD  vitamin C (ASCORBIC ACID) 500 MG tablet Take 500 mg by mouth at bedtime.     Historical Provider, MD    Family History Family History  Problem Relation Age of Onset  . Cancer Mother     Skin  . Heart disease Mother   . Emphysema Father   . Heart disease Father   . Congestive Heart Failure Father   . Heart disease Brother   . Heart disease Brother   . Early death Sister     Social History Social History  Substance Use Topics  . Smoking status: Former Smoker    Packs/day: 1.50    Years: 15.00    Types: Cigarettes  . Smokeless tobacco: Never Used     Comment: Smoked 1.5 packs per day for 15 years, quit in 1995.  Marland Kitchen Alcohol use 3.6 oz/week    6 Glasses of wine per week     Comment: 01/13/2015 "couple glasses of  wine maybe 3 days/wk"     Allergies   Atorvastatin; Codeine; Morphine; Other; Rosuvastatin; and Iohexol   Review of Systems Review of Systems   All systems reviewed and negative, other than as noted in HPI.    Physical Exam Updated Vital Signs BP 132/89 (BP Location: Right Arm)   Pulse 67   Temp 98.4 F (36.9 C) (Oral)   Resp 18   SpO2 96%   Physical Exam  Constitutional: She appears well-developed and well-nourished. No  distress.  HENT:  Head: Normocephalic and atraumatic.  Eyes: Conjunctivae are normal. Right eye exhibits no discharge. Left eye exhibits no discharge.  Neck: Neck supple.  Cardiovascular: Normal rate, regular rhythm and normal heart sounds.  Exam reveals no gallop and no friction rub.   No murmur heard. Pulmonary/Chest: Effort normal and breath sounds normal. No respiratory distress.  Abdominal: Soft. She exhibits no distension. There is no tenderness.  Musculoskeletal: She exhibits edema. She exhibits no tenderness.  Mild to moderate symmetric LE edema. No calf tenderness.   Neurological: She is alert.  Skin: Skin is warm and dry.  Psychiatric: She has a normal mood and affect. Her behavior is normal. Thought content normal.  Nursing note and vitals reviewed.       ED Treatments / Results  Labs (all labs ordered are listed, but only abnormal results are displayed) Labs Reviewed - No data to display  EKG  EKG Interpretation None       Radiology No results found.  Procedures Procedures (including critical care time)  Medications Ordered in ED Medications  polyethylene glycol (MIRALAX / GLYCOLAX) packet 34 g (not administered)  furosemide (LASIX) tablet 40 mg (not administered)  potassium chloride SA (K-DUR,KLOR-CON) CR tablet 40 mEq (not administered)     Initial Impression / Assessment and Plan / ED Course  I have reviewed the triage vital signs and the nursing notes.  Pertinent labs & imaging results that were available during my care of the patient were reviewed by me and considered in my medical decision making (see chart for details).  Clinical Course     88yF with LE edema and constipation. Mild to moderate pitting edema on exam, but she is in no distress. Can carry on an extended conversation with no apparent difficulty. Lungs sound clear. Denies orthopnea. HD stable. Oxygen saturations normal. I do not feel she requires a work-up today.  Will give her a  dose of diuretic in ED and have her increase home dose for a couple days. Bowel regimen.   It has been determined that no acute conditions requiring further emergency intervention are present at this time. The patient has been advised of the diagnosis and plan. I reviewed any labs and imaging including any potential incidental findings. We have discussed signs and symptoms that warrant return to the ED and they are listed in the discharge instructions.    Final Clinical Impressions(s) / ED Diagnoses   Final diagnoses:  Peripheral edema  Constipation, unspecified constipation type    New Prescriptions New Prescriptions   No medications on file     Virgel Manifold, MD 05/26/16 1631

## 2016-05-17 NOTE — ED Triage Notes (Signed)
Per EMS, pt complains of constipation and pressure in her abdomen for the past 3 days. Pt also has bilateral edema.  Pt has hx of DM. Pt is from home.

## 2016-05-17 NOTE — Patient Outreach (Signed)
[facility]) Care Management  05/17/2016  [name] September 01, 1927 [id number]  Referral via EMMI-Heart Failure dashboard:  Telephone call to patient; left message on voice mail requesting call back.  Plan: Will follow up.  Sherrin Daisy, RN BSN [place] Management Coordinator [facility] Care Management  [phone]

## 2016-05-17 NOTE — Progress Notes (Signed)
Pt very HOH and gets upset when she response to staff incorrectly  Pt able to confirm with ED CM her pcp, Dr Halford Chessman is pulmonologist and states she came to hospital with ems and "left my house in a circus"  Pt reports not having money to get back home upon discharge and voice concern about this  Contacted ED SW about pt concern with going home

## 2016-05-20 ENCOUNTER — Other Ambulatory Visit: Payer: Self-pay | Admitting: *Deleted

## 2016-05-20 ENCOUNTER — Other Ambulatory Visit: Payer: Self-pay | Admitting: Family Medicine

## 2016-05-20 NOTE — Patient Outreach (Signed)
[facility]) Care Management  05/20/2016  [name] February 26, 1928 [id number]  EMMI-HF dashboard referral:  Telephone call x 2nd attempt; unable to leave message on main number or alternate number; recordings state not accepting calls or disconnected.  Plan: Will follow up. Sherrin Daisy, RN BSN [place] Management Coordinator [facility] Care Management  [phone]

## 2016-05-23 ENCOUNTER — Other Ambulatory Visit: Payer: Self-pay | Admitting: *Deleted

## 2016-05-23 NOTE — Patient Outreach (Signed)
[facility]) Care Management  05/23/2016  Ellesse Soper Aug 20, 1927 [id number]   EMMI-HF dashboard referral:   Telephone call x 3; patient advised of reason for call. States she is doing well today & only has slight swelling & weight was 119 today. States she has not had weight gain in last several days. States she is currently using oxygen at 3 liters. States no trouble breathing.  States she  is having phone trouble & wishes to have electronic calls stopped.  States she has trouble with the electronics and it doesn't understand her responses.   Patient consents to talking with live person.   Plan" Will follow up with patient. Advise appropriate personnel to stop EMMI calls.  Sherrin Daisy, RN BSN [place] Management Coordinator [facility] Care Management  [phone]

## 2016-05-24 ENCOUNTER — Encounter: Payer: Self-pay | Admitting: *Deleted

## 2016-05-24 NOTE — Telephone Encounter (Signed)
This encounter was created in error - please disregard.

## 2016-05-26 ENCOUNTER — Ambulatory Visit (INDEPENDENT_AMBULATORY_CARE_PROVIDER_SITE_OTHER): Payer: Medicare Other | Admitting: Pharmacist

## 2016-05-26 DIAGNOSIS — I480 Paroxysmal atrial fibrillation: Secondary | ICD-10-CM

## 2016-05-26 LAB — COAGUCHEK XS/INR WAIVED
INR: 1.6 — AB (ref 0.9–1.1)
PROTHROMBIN TIME: 19.3 s

## 2016-05-30 ENCOUNTER — Other Ambulatory Visit: Payer: Self-pay | Admitting: *Deleted

## 2016-05-30 NOTE — Patient Outreach (Signed)
[name] Care Management  05/30/2016  [name] June 24, 1928 [id number]  Telephone call to patient in follow up from EMMI-HF referral last week.  Patient voices that she is feeling well today. Only using oxygen if needed when she is active. States she is independent with personal care & cooking her meals. States she is using her walker or cane when walking and has not had any falls. States she had emergency room visit 11/21 for swelling in leg & constipation. Voices she is weighing daily with assistance & weight ranging from 118-120 pounds in past week. States today 118 pounds. States she knows when to notify MD office as it relates to weight gain. Voices that she is taking medications as instructed by her doctors. States family member takes her to all MD appointments.  States no problem with constipation currently. States she is drinking prune juice with other juices. States she has a daily activity schedule & she walks daily.  Patient was advised of advantage of eating nuts, vegetables, fruit and increasing water intake if no restrictions as it relates to bowel regulation.  Patient voices understanding. Patient was also advised to follow primary care provider instructions regarding going to emergency room for unplanned health situations.    Patient states she does not currently have any case management needs and does not need any community visits regarding her heart failure. States she does not currently need telephone follow up because she is getting calls from numerous people that ask the same questions. States she has support from family & friends.  Patient voices that she does not need any further educational materials on heart failure or constipation.   Plan: Will close case. Send MD closure letter.  Send to care management assistant to close out.   Melinda Daisy, RN BSN [place] Management Coordinator [facility] Care Management  [phone]

## 2016-05-31 ENCOUNTER — Encounter: Payer: Self-pay | Admitting: *Deleted

## 2016-05-31 ENCOUNTER — Telehealth: Payer: Self-pay | Admitting: Pharmacist

## 2016-05-31 NOTE — Telephone Encounter (Signed)
Patient is to have tooth extracts 06/08/2016 at 9:30am. Dr Bobby Rumpf is requesting that she hold warfarin 3 days prior to procedure.  Notified patient's pharmacy to no put warfarin in packaging for 12/10, 12/11, 12/12.  Will restart warfarin on evening of 06/08/2016.

## 2016-06-06 ENCOUNTER — Other Ambulatory Visit: Payer: Self-pay | Admitting: Cardiovascular Disease

## 2016-06-06 DIAGNOSIS — J449 Chronic obstructive pulmonary disease, unspecified: Secondary | ICD-10-CM | POA: Diagnosis not present

## 2016-06-06 DIAGNOSIS — I259 Chronic ischemic heart disease, unspecified: Secondary | ICD-10-CM | POA: Diagnosis not present

## 2016-06-06 NOTE — Telephone Encounter (Signed)
REFILL 

## 2016-06-07 ENCOUNTER — Other Ambulatory Visit: Payer: Self-pay | Admitting: Cardiovascular Disease

## 2016-06-07 NOTE — Telephone Encounter (Signed)
REFILL 

## 2016-06-12 DIAGNOSIS — J449 Chronic obstructive pulmonary disease, unspecified: Secondary | ICD-10-CM | POA: Diagnosis not present

## 2016-06-12 DIAGNOSIS — I259 Chronic ischemic heart disease, unspecified: Secondary | ICD-10-CM | POA: Diagnosis not present

## 2016-06-13 ENCOUNTER — Telehealth: Payer: Self-pay | Admitting: Cardiovascular Disease

## 2016-06-13 NOTE — Telephone Encounter (Signed)
error 

## 2016-06-14 ENCOUNTER — Ambulatory Visit (INDEPENDENT_AMBULATORY_CARE_PROVIDER_SITE_OTHER): Payer: Medicare Other | Admitting: Pharmacist

## 2016-06-14 DIAGNOSIS — R300 Dysuria: Secondary | ICD-10-CM

## 2016-06-14 DIAGNOSIS — I480 Paroxysmal atrial fibrillation: Secondary | ICD-10-CM

## 2016-06-14 LAB — URINALYSIS, ROUTINE W REFLEX MICROSCOPIC
BILIRUBIN UA: NEGATIVE
Glucose, UA: NEGATIVE
Ketones, UA: NEGATIVE
Leukocytes, UA: NEGATIVE
NITRITE UA: NEGATIVE
PH UA: 5 (ref 5.0–7.5)
RBC UA: NEGATIVE
Specific Gravity, UA: <1.005 — ABNORMAL LOW (ref 1.005–1.030)
UUROB: 0.2 mg/dL (ref 0.2–1.0)

## 2016-06-14 LAB — COAGUCHEK XS/INR WAIVED
INR: 1.2 — ABNORMAL HIGH (ref 0.9–1.1)
Prothrombin Time: 13.8 s

## 2016-06-14 LAB — MICROSCOPIC EXAMINATION
Bacteria, UA: NONE SEEN
RENAL EPITHEL UA: NONE SEEN /HPF

## 2016-06-16 ENCOUNTER — Other Ambulatory Visit: Payer: Self-pay | Admitting: Family Medicine

## 2016-06-16 ENCOUNTER — Telehealth: Payer: Self-pay | Admitting: Pulmonary Disease

## 2016-06-16 DIAGNOSIS — J9611 Chronic respiratory failure with hypoxia: Secondary | ICD-10-CM

## 2016-06-16 LAB — URINE CULTURE

## 2016-06-16 NOTE — Telephone Encounter (Signed)
ONO with 3 liters 06/03/16 >> Test time 5 hrs 1 min.  Basal SpO2 70%, low SpO2 66.8%.  Spent 298.5 min with SpO2 < 88%   Will have my nurse confirm with pt that she was wearing supplemental oxygen during the ONO.  If not, then please arrange for repeat ONO with her using 3 liters oxygen.  If she was using 3 liters oxygen during ONO, then have this increase to 5 liters and repeat ONO with 5 liters.

## 2016-06-17 NOTE — Telephone Encounter (Signed)
Spoke with the pt  She states that she was not wearing her o2 at all when she had her ONO  I have ordered ONO on 3lpm to be done with [facility]

## 2016-06-23 NOTE — Telephone Encounter (Signed)
Closed encounter °

## 2016-06-24 ENCOUNTER — Encounter: Payer: Self-pay | Admitting: Pulmonary Disease

## 2016-06-28 ENCOUNTER — Other Ambulatory Visit: Payer: Self-pay | Admitting: Pharmacist

## 2016-06-30 ENCOUNTER — Ambulatory Visit (INDEPENDENT_AMBULATORY_CARE_PROVIDER_SITE_OTHER): Payer: Medicare Other | Admitting: Pharmacist

## 2016-06-30 DIAGNOSIS — E1122 Type 2 diabetes mellitus with diabetic chronic kidney disease: Secondary | ICD-10-CM | POA: Diagnosis not present

## 2016-06-30 DIAGNOSIS — Z794 Long term (current) use of insulin: Secondary | ICD-10-CM | POA: Diagnosis not present

## 2016-06-30 DIAGNOSIS — I480 Paroxysmal atrial fibrillation: Secondary | ICD-10-CM

## 2016-06-30 DIAGNOSIS — N183 Chronic kidney disease, stage 3 unspecified: Secondary | ICD-10-CM

## 2016-06-30 DIAGNOSIS — R809 Proteinuria, unspecified: Secondary | ICD-10-CM | POA: Insufficient documentation

## 2016-06-30 LAB — COAGUCHEK XS/INR WAIVED
INR: 1.5 — ABNORMAL HIGH (ref 0.9–1.1)
PROTHROMBIN TIME: 17.9 s

## 2016-06-30 NOTE — Progress Notes (Signed)
Patient ID: [name], female   DOB: 04-Aug-1927, 83 y.o.   MRN: [id number]  Subjective:    [name] is a 83 y.o. female who presents for CGM placement and evaluation of Type insulin dependent diabetes mellitus and to follow up chronic anticoagulation / INR.      DM:  [name] reports that her BG has been very variable recently.  She has experienced lows during the night and also reports hyperglycemia from time to time.  She is not clear on her current sliding scale for Novolog / Humalog and reports that she believes her new housekeeper might have thrown away the sliding scale she was following. She states she is taking Levemir 8 units once each morning.    Known diabetic complications: nephropathy, retinopathy and cardiovascular disease Cardiovascular risk factors: advanced age (older than 60 for men, 43 for women), diabetes mellitus, dyslipidemia, hypertension, microalbuminuria and sedentary lifestyle  Patient was suppose to bring in her glucometer today but her daughter in law who usually bring her to appointments is sick and was not there to remind her today.  Current monitoring regimen: home blood tests - 3-5 times daily Home blood sugar records: none Any episodes of hypoglycemia? yes - see above   Anticoagulation:   Indication: atrial fibrillation Bleeding signs/symptoms: None Thromboembolic signs/symptoms: None  Missed Coumadin doses: None Medication changes: no Dietary changes: no Bacterial/viral infection: no     Objective:   INR Today: 1.5, improved from 1.2 on 06/14/2016  Current warfarin dose: 4mg  qd except 6mg  on Wednesdays    Lab Review Glucose (mg/dL)  Date Value  05/02/2016 122 (H)   Glucose, Bld (mg/dL)  Date Value  05/08/2016 136 (H)  04/25/2016 115 (H)  04/24/2016 164 (H)   CO2 (mmol/L)  Date Value  05/08/2016 35 (H)  05/02/2016 31 (H)  04/25/2016 34 (H)   BUN (mg/dL)  Date Value  05/08/2016 24 (H)  05/02/2016 22  04/25/2016 21 (H)   04/24/2016 25 (H)  03/17/2016 17  11/04/2015 26   Creatinine, Ser (mg/dL)  Date Value  05/08/2016 1.19 (H)  05/02/2016 0.92  04/25/2016 1.39 (H)     Assessment:    Diabetes Mellitus type II with complications mentioned above and current long term insulin use - under inadequate control; concern with patient's age about hypoglycemia.    Subtherapeutic INR for goal of 2-3    Plan:    1.  Continue Levemir 8 units qam.       Change Humalog / Novolog Sliding Scale slightly:    Less than 150 = none    151 to 200 = 2 units    201 to 250 = 3 units    251 to 300 = 4 units    301 to 350 = 5 units     351 or above = 6 units   2.  Continuous Glucose monitor was placed today to try to get better understanding of BG trends.  Patient educated about proper care of CGM and site.  CGM was placed on back of upper left arm.  Patient will wear at least 5 days and up to 14 days.  He is to keep BG, diet and insulin administration records. He can engage in regular activities with special care when showering, changing clothes and swimming (only up to 3 feet and for 30 minutes at a time)  3. Continue to check BG 3-5 times daily.  I discuss more relaxed BG goals with patient due to her age  and risk of falls.   FBG 100 to 150 and Post prandial less than 200. 4.   Anticoagulation Dose Instructions as of 06/30/2016      Dorene Grebe Tue Wed Thu Fri Sat   New Dose 4 mg 6 mg 4 mg 6 mg 4 mg 6 mg 4 mg    Description   Increase warfarin to 6mg  mondays, wednesdays and fridays.  4mg  all other days.     5. Follow up: 14 days    Cherre Robins, PharmD, CPP, CDE

## 2016-06-30 NOTE — Patient Instructions (Signed)
Humalog / Novolog Sliding Scale:   Less than 150 = none  151 to 200 = 2 units  201 to 250 = 3 units  251 to 300 = 4 units  301 to 350 = 5 units   351 or above = 6 units     Continue Levemir 8 units once each morning

## 2016-07-04 ENCOUNTER — Other Ambulatory Visit: Payer: Self-pay | Admitting: Family Medicine

## 2016-07-07 DIAGNOSIS — J449 Chronic obstructive pulmonary disease, unspecified: Secondary | ICD-10-CM | POA: Diagnosis not present

## 2016-07-07 DIAGNOSIS — I259 Chronic ischemic heart disease, unspecified: Secondary | ICD-10-CM | POA: Diagnosis not present

## 2016-07-09 ENCOUNTER — Other Ambulatory Visit: Payer: Self-pay | Admitting: Cardiovascular Disease

## 2016-07-11 ENCOUNTER — Telehealth: Payer: Self-pay | Admitting: Family Medicine

## 2016-07-11 NOTE — Telephone Encounter (Signed)
Changed appt from tomorrow to Wed 07/13/16 at request of family member that brings patient to office

## 2016-07-12 ENCOUNTER — Ambulatory Visit: Payer: Self-pay | Admitting: Pharmacist

## 2016-07-13 ENCOUNTER — Ambulatory Visit: Payer: Self-pay | Admitting: Pharmacist

## 2016-07-13 DIAGNOSIS — I259 Chronic ischemic heart disease, unspecified: Secondary | ICD-10-CM | POA: Diagnosis not present

## 2016-07-13 DIAGNOSIS — J449 Chronic obstructive pulmonary disease, unspecified: Secondary | ICD-10-CM | POA: Diagnosis not present

## 2016-07-14 ENCOUNTER — Ambulatory Visit: Payer: Self-pay | Admitting: Pharmacist

## 2016-07-15 ENCOUNTER — Ambulatory Visit (INDEPENDENT_AMBULATORY_CARE_PROVIDER_SITE_OTHER): Payer: Medicare Other | Admitting: Pharmacist

## 2016-07-15 ENCOUNTER — Encounter: Payer: Self-pay | Admitting: Family Medicine

## 2016-07-15 ENCOUNTER — Ambulatory Visit (INDEPENDENT_AMBULATORY_CARE_PROVIDER_SITE_OTHER): Payer: Medicare Other | Admitting: Family Medicine

## 2016-07-15 VITALS — BP 124/59 | HR 62 | Temp 97.2°F | Ht 61.0 in | Wt 124.2 lb

## 2016-07-15 DIAGNOSIS — I480 Paroxysmal atrial fibrillation: Secondary | ICD-10-CM

## 2016-07-15 DIAGNOSIS — I739 Peripheral vascular disease, unspecified: Secondary | ICD-10-CM | POA: Diagnosis not present

## 2016-07-15 DIAGNOSIS — N3945 Continuous leakage: Secondary | ICD-10-CM | POA: Diagnosis not present

## 2016-07-15 DIAGNOSIS — H548 Legal blindness, as defined in USA: Secondary | ICD-10-CM | POA: Diagnosis not present

## 2016-07-15 DIAGNOSIS — I509 Heart failure, unspecified: Secondary | ICD-10-CM | POA: Diagnosis not present

## 2016-07-15 LAB — COAGUCHEK XS/INR WAIVED
INR: 1.3 — AB (ref 0.9–1.1)
PROTHROMBIN TIME: 15.3 s

## 2016-07-15 NOTE — Patient Instructions (Signed)
Anticoagulation Dose Instructions as of 07/15/2016      Dorene Grebe Tue Wed Thu Fri Sat   New Dose 6 mg 6 mg 4 mg 6 mg 4 mg 6 mg 6 mg    Description   Increase warfarin to 6mg  Sunday, Mondays, Wednesdays, Fridays, and Saturdays.  4mg  all other days. Called Suanne Marker at [facility] and told new dose.  INR today 1.3 (thick)

## 2016-07-15 NOTE — Progress Notes (Signed)
BP (!) 124/59   Pulse 62   Temp 97.2 F (36.2 C) (Oral)   Ht '5\' 1"'  (1.549 m)   Wt 124 lb 3.2 oz (56.3 kg)   BMI 23.47 kg/m    Subjective:    Patient ID: [name], female    DOB: 1928/04/24, 83 y.o.   MRN: [id number]  HPI: [name] is a 83 y.o. female presenting on 07/15/2016 for Leg Swelling (for last 7 days, taking xtra torsemide) and Anticoagulation   HPI Leg swelling Patient has had continuous issues with her leg swelling which is increased recently despite her taking more of the torsemide that she is on. Her weight has fluctuated some but has been relatively stable. She has both renal failure and congestive heart failure and been diagnosed with peripheral vascular disease which has led to the swelling that she has been fighting. We have on multiple occasions recommended wraps or compression stockings but due to her decreased mobility and blindness she has a lot of difficulty getting those things on herself and her daughter cannot come help her get him on an every day. When discussed with them they were wondering if there is some kind of health care or home health care that they might be able to get for her. She also has a diagnosis of legal blindness which makes it difficult for her mobility.  Paroxysmal A. fib and INR recheck Patient is coming in for recheck of her A. fib and INR. She denies any issues with bleeding.  Relevant past medical, surgical, family and social history reviewed and updated as indicated. Interim medical history since our last visit reviewed. Allergies and medications reviewed and updated.  Review of Systems  Constitutional: Negative for chills and fever.  HENT: Negative for congestion, ear discharge and ear pain.   Eyes: Negative for redness and visual disturbance.  Respiratory: Negative for chest tightness and shortness of breath.   Cardiovascular: Positive for leg swelling. Negative for chest pain and palpitations.  Genitourinary: Negative  for difficulty urinating and dysuria.  Musculoskeletal: Negative for back pain and gait problem.  Skin: Negative for rash.  Neurological: Negative for light-headedness and headaches.  Psychiatric/Behavioral: Negative for agitation and behavioral problems.  All other systems reviewed and are negative.   Per HPI unless specifically indicated above     Objective:    BP (!) 124/59   Pulse 62   Temp 97.2 F (36.2 C) (Oral)   Ht '5\' 1"'  (1.549 m)   Wt 124 lb 3.2 oz (56.3 kg)   BMI 23.47 kg/m   Wt Readings from Last 3 Encounters:  07/15/16 124 lb 3.2 oz (56.3 kg)  05/13/16 122 lb 3.2 oz (55.4 kg)  05/04/16 122 lb 3.2 oz (55.4 kg)    Physical Exam  Constitutional: She is oriented to person, place, and time. She appears well-developed and well-nourished. No distress.  Eyes: Conjunctivae are normal.  Neck: Neck supple. No thyromegaly present.  Cardiovascular: Normal rate, regular rhythm, normal heart sounds and intact distal pulses.   No murmur heard. Pulmonary/Chest: Effort normal and breath sounds normal. No respiratory distress. She has no wheezes. She has no rales.  Musculoskeletal: Normal range of motion. She exhibits edema (2+ edema bilaterally lower extremities). She exhibits no tenderness.  Neurological: She is alert and oriented to person, place, and time. Coordination normal.  Skin: Skin is warm and dry. No rash noted. She is not diaphoretic.  Psychiatric: She has a normal mood and affect. Her behavior is normal.  Nursing note and vitals reviewed.     Assessment & Plan:   Problem List Items Addressed This Visit      Cardiovascular and Mediastinum   PAF, recurrance this admission, on chronic Amio/ Coumadin (Chronic)   Relevant Orders   Protime-INR   PVD (peripheral vascular disease), moderate carotid and LSCA disease (Chronic)   Relevant Orders   Home Health   Face-to-face encounter (required for Medicare/Medicaid patients)   Compression stockings   BMP8+EGFR   CHF  (congestive heart failure) ([place])   Relevant Orders   Home Health   Face-to-face encounter (required for Medicare/Medicaid patients)   Compression stockings   DME Other see comment   BMP8+EGFR   Protime-INR    Other Visit Diagnoses    Legal blindness    -  Primary   Relevant Orders   Home Health   Face-to-face encounter (required for Medicare/Medicaid patients)   Continuous leakage of urine       Relevant Orders   DME Other see comment   BMP8+EGFR       Follow up plan: Return if symptoms worsen or fail to improve.  Counseling provided for all of the vaccine components Orders Placed This Encounter  Procedures  . Compression stockings  . DME Other see comment  . BMP8+EGFR  . Protime-INR  . Home Health  . Face-to-face encounter (required for Medicare/Medicaid patients)    Caryl Pina, MD [facility] 07/15/2016, 2:41 PM

## 2016-07-16 LAB — BMP8+EGFR
BUN / CREAT RATIO: 19 (ref 12–28)
BUN: 23 mg/dL (ref 8–27)
CHLORIDE: 100 mmol/L (ref 96–106)
CO2: 32 mmol/L — ABNORMAL HIGH (ref 18–29)
Calcium: 8.9 mg/dL (ref 8.7–10.3)
Creatinine, Ser: 1.21 mg/dL — ABNORMAL HIGH (ref 0.57–1.00)
GFR, EST AFRICAN AMERICAN: 47 mL/min/{1.73_m2} — AB (ref >59)
GFR, EST NON AFRICAN AMERICAN: 41 mL/min/{1.73_m2} — AB (ref >59)
Glucose: 249 mg/dL — ABNORMAL HIGH (ref 65–99)
POTASSIUM: 4.1 mmol/L (ref 3.5–5.2)
SODIUM: 146 mmol/L — AB (ref 134–144)

## 2016-07-18 ENCOUNTER — Other Ambulatory Visit: Payer: Self-pay | Admitting: *Deleted

## 2016-07-18 ENCOUNTER — Encounter: Payer: Self-pay | Admitting: *Deleted

## 2016-07-18 NOTE — Progress Notes (Signed)
FYI  --  [facility] and [facility] both denied pt for Home Health. [facility] is not taking any [employer]. [facility] is not taking any managed care. I have attempted [facility] but they are about 2 weeks behind?

## 2016-07-18 NOTE — Progress Notes (Signed)
Okay to get so we can do is the best that we can do to try and get her some help, other any other systems that could help her with daily medication management

## 2016-07-18 NOTE — Progress Notes (Signed)
FYI- [facility] is not accepting [employer], [facility] is not accepting any managed care. I have tried [facility] and they are 2 weeks behind right now

## 2016-07-19 ENCOUNTER — Other Ambulatory Visit: Payer: Self-pay | Admitting: Pulmonary Disease

## 2016-07-19 ENCOUNTER — Other Ambulatory Visit: Payer: Self-pay | Admitting: Family Medicine

## 2016-07-20 ENCOUNTER — Encounter: Payer: Self-pay | Admitting: Family Medicine

## 2016-07-20 ENCOUNTER — Ambulatory Visit (INDEPENDENT_AMBULATORY_CARE_PROVIDER_SITE_OTHER): Payer: Medicare Other | Admitting: Family Medicine

## 2016-07-20 ENCOUNTER — Telehealth: Payer: Self-pay | Admitting: Pulmonary Disease

## 2016-07-20 VITALS — BP 117/59 | HR 60 | Temp 98.1°F | Ht 61.0 in | Wt 119.0 lb

## 2016-07-20 DIAGNOSIS — Z794 Long term (current) use of insulin: Secondary | ICD-10-CM | POA: Diagnosis not present

## 2016-07-20 DIAGNOSIS — J439 Emphysema, unspecified: Secondary | ICD-10-CM

## 2016-07-20 DIAGNOSIS — E1122 Type 2 diabetes mellitus with diabetic chronic kidney disease: Secondary | ICD-10-CM

## 2016-07-20 DIAGNOSIS — I480 Paroxysmal atrial fibrillation: Secondary | ICD-10-CM | POA: Diagnosis not present

## 2016-07-20 DIAGNOSIS — E785 Hyperlipidemia, unspecified: Secondary | ICD-10-CM

## 2016-07-20 DIAGNOSIS — N183 Chronic kidney disease, stage 3 (moderate): Secondary | ICD-10-CM

## 2016-07-20 DIAGNOSIS — I509 Heart failure, unspecified: Secondary | ICD-10-CM | POA: Diagnosis not present

## 2016-07-20 DIAGNOSIS — I70209 Unspecified atherosclerosis of native arteries of extremities, unspecified extremity: Secondary | ICD-10-CM | POA: Diagnosis not present

## 2016-07-20 DIAGNOSIS — I739 Peripheral vascular disease, unspecified: Secondary | ICD-10-CM | POA: Diagnosis not present

## 2016-07-20 DIAGNOSIS — H548 Legal blindness, as defined in USA: Secondary | ICD-10-CM | POA: Diagnosis not present

## 2016-07-20 DIAGNOSIS — E559 Vitamin D deficiency, unspecified: Secondary | ICD-10-CM | POA: Diagnosis not present

## 2016-07-20 DIAGNOSIS — I10 Essential (primary) hypertension: Secondary | ICD-10-CM | POA: Diagnosis not present

## 2016-07-20 LAB — COAGUCHEK XS/INR WAIVED
INR: 1.1 (ref 0.9–1.1)
PROTHROMBIN TIME: 13 s

## 2016-07-20 NOTE — Patient Instructions (Addendum)
Medicare Annual Wellness Visit   and the medical providers at [facility] strive to bring you the best medical care.  In doing so we not only want to address your current medical conditions and concerns but also to detect new conditions early and prevent illness, disease and health-related problems.    Medicare offers a yearly Wellness Visit which allows our clinical staff to assess your need for preventative services including immunizations, lifestyle education, counseling to decrease risk of preventable diseases and screening for fall risk and other medical concerns.    This visit is provided free of charge (no copay) for all Medicare recipients. The clinical pharmacists at [facility] have begun to conduct these Wellness Visits which will also include a thorough review of all your medications.    As you primary medical provider recommend that you make an appointment for your Annual Wellness Visit if you have not done so already this year.  You may set up this appointment before you leave today or you may call back [phone]) and schedule an appointment.  Please make sure when you call that you mention that you are scheduling your Annual Wellness Visit with the clinical pharmacist so that the appointment may be made for the proper length of time.     Continue current medications. Continue good therapeutic lifestyle changes which include good diet and exercise. Fall precautions discussed with patient. If an FOBT was given today- please return it to our front desk. If you are over 59 years old - you may need Prevnar 70 or the adult Pneumonia vaccine.  **Flu shots are available--- please call and schedule a FLU-CLINIC appointment**  After your visit with [place] today you will receive a survey in the mail or online from [employer] regarding your care with [place]. Please take a moment to fill this out. Your feedback is very  important to [place] as you can help [place] better understand your patient needs as well as improve your experience and satisfaction. WE CARE ABOUT YOU!!!   Please try to reduce caffeine intake Please wrap the legs the first thing with arising in the morning and did not rewrap them during the day We will try to get an appointment with the cardiologist that comes to [facility] so you did not have to go to [place] Try to keep the house cooler and not so hot We will arrange for you to have a home health nurse to come in and supervise your situation to make sure that everything is being done appropriately to maintain the best help possible in your home. She will check with the edema in the legs to make sure you're wrapping her legs appropriately Consider getting a cool mist humidifier

## 2016-07-20 NOTE — Progress Notes (Signed)
Subjective:    Patient ID: [name], female    DOB: [birth year], 83 y.o.   MRN: [id number]  HPI Pt here for follow up and management of chronic medical problems which includes diabetes, hyperlipidemia and hypertension. She is taking medication regularly.The patient comes to the visit today with her daughter-in-law. She complains of some congestion and drainage. She also continues to complain with edema despite the fluid pills that she is currently taking. She follows up regularly with the cardiologist and is due to get a ProTime today. She will be given an FOBT to return. She'll also get lab work today. She is a little early on getting her A1c checked today. The last one was done the end of October. We will get one at her next pro time visit. The patient continues to have some chest pain and shortness of breath. Her weight is asked to down from the last visit by 5 pounds with the additional fluid pill. We will continue with that current level of taking fluid medicine. He is wrapping her legs with an Ace bandage but wraps them several times a day. We emphasized that it was important to wrap them the first thing with getting out of the bed in the morning and to leave them wrapped until she goes to bed at night time. She understands this. She also complains with her heart racing at times and she is drinking coffee and tea with caffeine and we've encouraged her to reduce the caffeine intake. She complains with a lot of head congestion and drainage. The daughter-in-law indicates that she keeps her apartment with a temperature of around 78 and we encouraged her to keep the temperature to her lower as this drying out may cause her to have more head congestion and drainage. She seems to understand this. She is currently passing her water without problems and her bowels are moving okay.    Patient Active Problem List   Diagnosis Date Noted  . Microalbuminuria 06/30/2016  . CKD stage 3 secondary to diabetes  ([place]) 05/02/2016  . Atherosclerotic peripheral vascular disease ([place]) 11/04/2015  . Intertrochanteric fracture of left hip ([place])   . Neck pain   . Postoperative anemia due to acute blood loss   . Hip fracture requiring operative repair ([place]) 01/15/2015  . Preop cardiovascular exam 01/14/2015  . Hip fracture ([place]) 01/13/2015  . Osteopenia 01/05/2015  . Primary osteoarthritis involving multiple joints 06/12/2014  . CHF (congestive heart failure) ([place]) 12/28/2013  . Atypical chest pain 12/18/2013  . Upper airway cough syndrome 05/20/2013  . Pain in joint, ankle and foot 12/06/2012  . Olecranon bursitis of left elbow 08/26/2012  . Scalp lesion 08/26/2012  . Counseling regarding advanced directives 08/26/2012  . DM type 2 causing CKD stage 3 ([place]) 07/30/2012  . Sacral fracture, closed ([place]) 07/22/2012  . Ataxia 07/20/2012  . Weakness of both legs 07/20/2012  . Falls frequently 07/20/2012  . RBBB 08/25/2011  . CAD, remote RCA PCI, subsequently noted to be occluded, last cath 8/11 08/25/2011  . PVD (peripheral vascular disease), moderate carotid and LSCA disease 08/25/2011  . HTN (hypertension) 08/22/2011  . AK (actinic keratosis) 07/17/2011  . Shoulder pain 04/03/2011  . Gait instability 10/11/2010  . PAF, recurrance this admission, on chronic Amio/ Coumadin 07/06/2010  . COPD with emphysema ([place]) 06/16/2010  . Chronic respiratory failure with hypoxia ([place]) 06/07/2010  . Hyperlipidemia LDL goal <70 02/11/2010   Outpatient Encounter Prescriptions as of 07/20/2016  Medication Sig  .  ACCU-CHEK AVIVA PLUS test strip Test tid. Dx E11.9  . acetaminophen (TYLENOL) 500 MG tablet Take 500 mg by mouth every 4 (four) hours as needed for mild pain.   Marland Kitchen ALPRAZolam (XANAX) 0.25 MG tablet TAKE 1/2 TO 1 TABLET 2 TIMES A DAY AS NEEDED FOR ANXIETY  . amiodarone (PACERONE) 200 MG tablet TAKE (1/2) TABLET DAILY.  Marland Kitchen aspirin EC 81 MG tablet Take 81 mg by mouth daily with breakfast.   . atorvastatin  (LIPITOR) 20 MG tablet TAKE 1 TABLET DAILY IN THE EVENING (Patient taking differently: TAKE 20 mg by mouth at bedtime)  . Azelastine HCl 0.15 % SOLN Place 1 spray into both nostrils 2 (two) times daily as needed (congestion).  . baclofen (LIORESAL) 10 MG tablet TAKE 1/2 TABLET EVERY 8 HOURS AS NEEDED FOR MUSCLE SPASM  . budesonide-formoterol (SYMBICORT) 160-4.5 MCG/ACT inhaler Inhale 1 puff into the lungs 2 (two) times daily. (Patient taking differently: Inhale 1 puff into the lungs 2 (two) times daily as needed (wheezing). )  . cholecalciferol (VITAMIN D) 1000 units tablet Take 1,000 Units by mouth daily.  Marland Kitchen docusate sodium (COLACE) 100 MG capsule Take 100 mg by mouth daily as needed.   . ferrous sulfate 325 (65 FE) MG tablet TAKE 1 TABLET ONCE DAILY WITH BREAKFAST  . fluticasone (FLONASE) 50 MCG/ACT nasal spray Place 1 spray into both nostrils 2 (two) times daily. (Patient taking differently: Place 1 spray into both nostrils as needed. )  . glucosamine-chondroitin 500-400 MG tablet Take 1 tablet by mouth 2 (two) times daily.  Marland Kitchen guaifenesin (HUMIBID E) 400 MG TABS tablet Take 600 mg by mouth 2 (two) times daily.  Marland Kitchen HUMALOG KWIKPEN 100 UNIT/ML KiwkPen INJECT UP TO 8 UNITS BEFORE MEALS AS DIRECTED BY SLIDING SCALE. MAX 24 UNITS PER DAY  . hydrocortisone (ANUSOL-HC) 25 MG suppository Place 1 suppository (25 mg total) rectally 2 (two) times daily as needed for hemorrhoids or itching.  . Insulin Pen Needle (PEN NEEDLES) 31G X 6 MM MISC Use to administer insulin up to qid  . Ipratropium-Albuterol (COMBIVENT) 20-100 MCG/ACT AERS respimat Inhale 1 puff into the lungs every 6 (six) hours as needed for wheezing or shortness of breath.  Marland Kitchen LEVEMIR FLEXTOUCH 100 UNIT/ML Pen INJECT UP TO 20 UNITS SQ EVERY MORNING AS DIRECTED (Patient taking differently: INJECT 8 units once a day each morning)  . metoprolol succinate (TOPROL-XL) 25 MG 24 hr tablet TAKE (1/2) TABLET DAILY. (Patient taking differently: Take 107m by  mouth in the morning)  . Multiple Vitamins-Minerals (CENTRUM ADULTS) TABS Take 1 tablet by mouth daily with breakfast.  . Multiple Vitamins-Minerals (PRESERVISION AREDS) CAPS Take 1 capsule by mouth daily with breakfast.  . NIFEdipine (PROCARDIA-XL/ADALAT-CC/NIFEDICAL-XL) 30 MG 24 hr tablet TAKE 1 TABLET ONCE DAILY  . nitroGLYCERIN (NITROSTAT) 0.4 MG SL tablet Place 1 tablet (0.4 mg total) under the tongue every 5 (five) minutes as needed for chest pain.  .Marland Kitchenondansetron (ZOFRAN ODT) 4 MG disintegrating tablet Take 1 tablet (4 mg total) by mouth every 8 (eight) hours as needed for nausea or vomiting.  . OXYGEN Inhale 2.5 L into the lungs daily.  . pantoprazole (PROTONIX) 40 MG tablet Take 1 tablet (40 mg total) by mouth daily. (Patient taking differently: Take 40 mg by mouth daily with breakfast. )  . polyethylene glycol (MIRALAX / GLYCOLAX) packet Take 17 g by mouth 2 (two) times daily as needed for moderate constipation.  . polyethylene glycol powder (GLYCOLAX/MIRALAX) powder Take 17 g by mouth  daily as needed. (Patient taking differently: Take 17 g by mouth daily as needed for mild constipation. )  . potassium chloride SA (K-DUR,KLOR-CON) 20 MEQ tablet TAKE 1 TABLET DAILY (Patient taking differently: Take 20 mEq by mouth in the morning)  . PRODIGY TWIST TOP LANCETS 28G MISC CHECK BLOOD SUGAR UP TO 4 TIMES A DAY  . simethicone (MYLICON) 80 MG chewable tablet Chew 80 mg by mouth at bedtime as needed for flatulence.  . torsemide (DEMADEX) 20 MG tablet TAKE  (1)  TABLET TWICE A DAY.  . traMADol (ULTRAM) 50 MG tablet Take 1 tablet (50 mg total) by mouth every 6 (six) hours as needed for severe pain.  . vitamin C (ASCORBIC ACID) 500 MG tablet Take 500 mg by mouth at bedtime.   Marland Kitchen warfarin (COUMADIN) 4 MG tablet TAKE 1 TABLET DAILY  . [DISCONTINUED] furosemide (LASIX) 20 MG tablet Take 20 mg by mouth daily as needed for fluid.   No facility-administered encounter medications on file as of 07/20/2016.        Review of Systems  Constitutional: Negative.   HENT: Positive for congestion and postnasal drip.   Eyes: Negative.   Respiratory: Negative.   Cardiovascular: Positive for leg swelling (with redness ).  Gastrointestinal: Negative.   Endocrine: Negative.   Genitourinary: Negative.   Musculoskeletal: Negative.   Skin: Negative.   Allergic/Immunologic: Negative.   Neurological: Negative.   Hematological: Negative.   Psychiatric/Behavioral: Negative.        Objective:   Physical Exam  Constitutional: She is oriented to person, place, and time. She appears well-developed and well-nourished. No distress.  The patient is elderly but alert.  HENT:  Head: Normocephalic and atraumatic.  Right Ear: External ear normal.  Left Ear: External ear normal.  Nose: Nose normal.  Mouth/Throat: Oropharynx is clear and moist. No oropharyngeal exudate.  Eyes: Conjunctivae and EOM are normal. Pupils are equal, round, and reactive to light. Right eye exhibits no discharge. Left eye exhibits no discharge. No scleral icterus.  Neck: Normal range of motion. Neck supple. No thyromegaly present.  No bruits or thyromegaly  Cardiovascular: Normal rate, regular rhythm and normal heart sounds.   No murmur heard. The heart today is regular at 72/m  Pulmonary/Chest: Effort normal and breath sounds normal. No respiratory distress. She has no wheezes. She has no rales. She exhibits no tenderness.  Abdominal: Soft. Bowel sounds are normal. She exhibits no mass. There is no tenderness. There is no rebound and no guarding.  Musculoskeletal: She exhibits edema.  Patient comes to the visit today in a wheelchair. She does have pitting edema below the knees in the calf area bilaterally but not in the feet.  Lymphadenopathy:    She has no cervical adenopathy.  Neurological: She is alert and oriented to person, place, and time.  Skin: Skin is warm and dry. No rash noted.  Psychiatric: She has a normal mood and  affect. Her behavior is normal. Judgment and thought content normal.  Nursing note and vitals reviewed.  BP (!) 117/59 (BP Location: Right Arm)   Pulse 60   Temp 98.1 F (36.7 C) (Oral)   Ht _0  (1.549 m)   Wt 119 lb (54 kg)   BMI 22.48 kg/m         Assessment & Plan:  1. Legal blindness -Patient needs more supervision. We will request home health nurse to go in and check on her daily because of her problems with congestive heart failure and  edema and making sure her legs are wrapped properly. - CBC with Differential/Platelet  2. Type 2 diabetes mellitus with stage 3 chronic kidney disease, with long-term current use of insulin (HCC) -Continue to check blood sugars periodically - CBC with Differential/Platelet  3. Hyperlipidemia LDL goal <70 -Continue with aggressive therapeutic lifestyle changes as much as possible and continue with current statin drug - CBC with Differential/Platelet - Lipid panel  4. CKD stage 3 secondary to diabetes ([place]) -Continue diuretics as currently doing as weight loss has been good with 5 pounds of weight loss. - BMP8+EGFR - CBC with Differential/Platelet  5. Essential hypertension -Low pressures good today and she will continue with current treatment - BMP8+EGFR - CBC with Differential/Platelet - Hepatic function panel  6. Vitamin D deficiency -In you with current treatment pending results of lab work - CBC with Differential/Platelet - VITAMIN D 25 Hydroxy (Vit-D Deficiency, Fractures)  7. Paroxysmal atrial fibrillation ([place]) -Follow-up with cardiology as planned and reduce intake of caffeine as much as possible - CBC with Differential/Platelet - CoaguChek XS/INR Waived  8. Chronic congestive heart failure, unspecified congestive heart failure type [facility]) -Follow-up with cardiology  9. PVD (peripheral vascular disease), moderate carotid and LSCA disease -Wear Ace wraps as currently doing and put these on the first thing with arising  in the morning  10. Pulmonary emphysema, unspecified emphysema type ([place]) -Make all possible efforts to avoid getting the flu and staying out of crowds of people  11. Atherosclerotic peripheral vascular disease ([place]) -[place] aggressive therapeutic lifestyle changes  12. PAF (paroxysmal atrial fibrillation) ([place]) -Follow-up with cardiology as planned -Arrange for in home health for supervision of her multiple problems  Patient Instructions                       Medicare Annual Wellness Visit  [facility] and the medical providers at [facility] strive to bring you the best medical care.  In doing so we not only want to address your current medical conditions and concerns but also to detect new conditions early and prevent illness, disease and health-related problems.    Medicare offers a yearly Wellness Visit which allows our clinical staff to assess your need for preventative services including immunizations, lifestyle education, counseling to decrease risk of preventable diseases and screening for fall risk and other medical concerns.    This visit is provided free of charge (no copay) for all Medicare recipients. The clinical pharmacists at [facility] have begun to conduct these Wellness Visits which will also include a thorough review of all your medications.    As you primary medical provider recommend that you make an appointment for your Annual Wellness Visit if you have not done so already this year.  You may set up this appointment before you leave today or you may call back ([phone]) and schedule an appointment.  Please make sure when you call that you mention that you are scheduling your Annual Wellness Visit with the clinical pharmacist so that the appointment may be made for the proper length of time.     Continue current medications. Continue good therapeutic lifestyle changes which include good diet and exercise. Fall precautions  discussed with patient. If an FOBT was given today- please return it to our front desk. If you are over 64 years old - you may need Prevnar 63 or the adult Pneumonia vaccine.  **Flu shots are available--- please call and schedule a FLU-CLINIC appointment**  After your visit with [place] today you will receive a survey in the mail or online from [employer] regarding your care with [place]. Please take a moment to fill this out. Your feedback is very important to [place] as you can help [place] better understand your patient needs as well as improve your experience and satisfaction. WE CARE ABOUT YOU!!!   Please try to reduce caffeine intake Please wrap the legs the first thing with arising in the morning and did not rewrap them during the day We will try to get an appointment with the cardiologist that comes to [facility] so you did not have to go to [place] Try to keep the house cooler and not so hot We will arrange for you to have a home health nurse to come in and supervise your situation to make sure that everything is being done appropriately to maintain the best help possible in your home. She will check with the edema in the legs to make sure you're wrapping her legs appropriately Consider getting a cool mist humidifier   Arrie Senate MD

## 2016-07-20 NOTE — Telephone Encounter (Signed)
lmom tcb x1 to [facility]

## 2016-07-21 ENCOUNTER — Telehealth: Payer: Self-pay | Admitting: Family Medicine

## 2016-07-21 LAB — CBC WITH DIFFERENTIAL/PLATELET
BASOS: 1 %
Basophils Absolute: 0.1 10*3/uL (ref 0.0–0.2)
EOS (ABSOLUTE): 0.1 10*3/uL (ref 0.0–0.4)
EOS: 1 %
HEMATOCRIT: 35 % (ref 34.0–46.6)
HEMOGLOBIN: 11.3 g/dL (ref 11.1–15.9)
IMMATURE GRANULOCYTES: 0 %
Immature Grans (Abs): 0 10*3/uL (ref 0.0–0.1)
Lymphocytes Absolute: 0.6 10*3/uL — ABNORMAL LOW (ref 0.7–3.1)
Lymphs: 12 %
MCH: 31.1 pg (ref 26.6–33.0)
MCHC: 32.3 g/dL (ref 31.5–35.7)
MCV: 96 fL (ref 79–97)
MONOCYTES: 13 %
MONOS ABS: 0.6 10*3/uL (ref 0.1–0.9)
NEUTROS PCT: 73 %
Neutrophils Absolute: 3.5 10*3/uL (ref 1.4–7.0)
Platelets: 164 10*3/uL (ref 150–379)
RBC: 3.63 x10E6/uL — AB (ref 3.77–5.28)
RDW: 14.1 % (ref 12.3–15.4)
WBC: 4.8 10*3/uL (ref 3.4–10.8)

## 2016-07-21 LAB — VITAMIN D 25 HYDROXY (VIT D DEFICIENCY, FRACTURES): Vit D, 25-Hydroxy: 30.5 ng/mL (ref 30.0–100.0)

## 2016-07-21 LAB — HEPATIC FUNCTION PANEL
ALBUMIN: 3.7 g/dL (ref 3.5–4.7)
ALK PHOS: 94 IU/L (ref 39–117)
ALT: 27 IU/L (ref 0–32)
AST: 19 IU/L (ref 0–40)
BILIRUBIN TOTAL: 0.6 mg/dL (ref 0.0–1.2)
BILIRUBIN, DIRECT: 0.22 mg/dL (ref 0.00–0.40)
Total Protein: 6.2 g/dL (ref 6.0–8.5)

## 2016-07-21 LAB — BMP8+EGFR
BUN / CREAT RATIO: 18 (ref 12–28)
BUN: 23 mg/dL (ref 8–27)
CO2: 34 mmol/L — AB (ref 18–29)
CREATININE: 1.27 mg/dL — AB (ref 0.57–1.00)
Calcium: 10 mg/dL (ref 8.7–10.3)
Chloride: 96 mmol/L (ref 96–106)
GFR calc Af Amer: 44 mL/min/{1.73_m2} — ABNORMAL LOW (ref >59)
GFR, EST NON AFRICAN AMERICAN: 38 mL/min/{1.73_m2} — AB (ref >59)
GLUCOSE: 76 mg/dL (ref 65–99)
Potassium: 3.9 mmol/L (ref 3.5–5.2)
SODIUM: 146 mmol/L — AB (ref 134–144)

## 2016-07-21 LAB — LIPID PANEL
CHOL/HDL RATIO: 2 ratio (ref 0.0–4.4)
Cholesterol, Total: 170 mg/dL (ref 100–199)
HDL: 86 mg/dL (ref >39)
LDL CALC: 71 mg/dL (ref 0–99)
Triglycerides: 66 mg/dL (ref 0–149)
VLDL Cholesterol Cal: 13 mg/dL (ref 5–40)

## 2016-07-21 NOTE — Telephone Encounter (Signed)
Patient aware- referral still being worked on.

## 2016-07-22 NOTE — Telephone Encounter (Signed)
Nothing further needed 

## 2016-07-22 NOTE — Telephone Encounter (Signed)
Called to speak with [name] but she is out today and [name] is covering for her and [name] at [facility].  Will seen this over to VS to make him aware that the pt declined doing the ONO on 3 liters.  Anything further that we need to do?  Please advise. thanks

## 2016-07-25 ENCOUNTER — Ambulatory Visit (INDEPENDENT_AMBULATORY_CARE_PROVIDER_SITE_OTHER): Payer: Medicare Other

## 2016-07-25 ENCOUNTER — Ambulatory Visit (INDEPENDENT_AMBULATORY_CARE_PROVIDER_SITE_OTHER): Payer: Medicare Other | Admitting: Pharmacist

## 2016-07-25 VITALS — HR 78

## 2016-07-25 DIAGNOSIS — R05 Cough: Secondary | ICD-10-CM | POA: Diagnosis not present

## 2016-07-25 DIAGNOSIS — I480 Paroxysmal atrial fibrillation: Secondary | ICD-10-CM

## 2016-07-25 DIAGNOSIS — R059 Cough, unspecified: Secondary | ICD-10-CM

## 2016-07-25 LAB — COAGUCHEK XS/INR WAIVED
INR: 1.3 — AB (ref 0.9–1.1)
PROTHROMBIN TIME: 15.4 s

## 2016-07-25 MED ORDER — WARFARIN SODIUM 4 MG PO TABS: 6.0000 mg | ORAL_TABLET | Freq: Every day | ORAL | 1 refills | Status: DC

## 2016-07-25 MED ORDER — CEPHALEXIN 500 MG PO CAPS: 500.0000 mg | ORAL_CAPSULE | Freq: Three times a day (TID) | ORAL | 0 refills | Status: DC

## 2016-07-25 NOTE — Patient Instructions (Addendum)
Anticoagulation Dose Instructions as of 07/25/2016      Dorene Grebe Tue Wed Thu Fri Sat   New Dose 6 mg 6 mg 6 mg 6 mg 6 mg 6 mg 6 mg    Description   Take an extra 1/2 tablet for next 2 days.  Then increase warfarin to 6mg  daily (1 and 1/2 tablets of 3mg  strength)  INR today is 1.3 (thick)     Humalog Sliding Scale - check blood glucose prior to each meal and give Humalog according to blood glucose reading  Less than 120 = no Humalog  120 to 150 = 2 units  151 to 200 = 3 units  201 to 250 = 4 units  251 to 300 = 5 units  301 or above = 6 units

## 2016-07-25 NOTE — Progress Notes (Signed)
Subjective:    CC: uncontrolled type 2 DM requiring insulin and atrial fibrillation / chronic anticoagulation  HPI - placed CGM about 3 weeks ago.  Report has been reviewed and will discuss possible changes with patient today.  Indication: atrial fibrillation Bleeding signs/symptoms: None Thromboembolic signs/symptoms: None  Missed Coumadin doses: None Medication changes: yes - diuretic recently increased Dietary changes: no Bacterial/viral infection: possibly - patient c/o of cough, SOB and chest congestion Last week patient was seen for LEE.  Weight is stable and LEE has improved.  Other concerns: yes - reviewed CGM results with patient and daughter in law.  avg BG was 199 and estimated A1c 8.6%  Per CGM report: Avg BG in AM was 154 Avg BG at lunch was 193 Avg BG in PM was 263  Current insulin regimen:  Levemir 8 units qd Humalog up to 8 units prior to each meal per sliding scale - patient cannot recall last sliding scale instructions and she has lost the instructions that she had.  She is dosing based on her memory of last sliding scale.     Objective:    INR Today: 1.3 Current dose: 6mg  daily except 3mg  tuesdays and thursdays  Vitals:   07/25/16 1702  Pulse: 78    O2 Sat = 93%  Assessment:    Subtherapeutic INR for goal of 2-3   Uncontrolled type 2 DM requiring insulin therapy - elevated BG per CGM  CHF / edema - improving   Plan:    1. New dose: take extra 1/2 tablet for next 2 days, then increase warfarin to 6mg  daily   2. Next INR: 1 week   3.  Humalog Sliding Scale - check blood glucose prior to each meal and give Humalog according to blood glucose reading  Less than 120 = no Humalog 120 to 150 = 2 units 151 to 200 = 3 units 201 to 250 = 4 units 251 to 300 = 5 units 301 or above = 6 units  4.  Cephalexin 500mg  tid for 7 days per Dr Laurance Flatten 5.  Below ordered per Dr Laurance Flatten due to cough / congestion Orders Placed This Encounter  Procedures  . DG Chest 2  View    Standing Status:   Future    Number of Occurrences:   1    Standing Expiration Date:   09/24/2017    Order Specific Question:   Reason for Exam (SYMPTOM  OR DIAGNOSIS REQUIRED)    Answer:   chest tight, congestion    Order Specific Question:   Preferred imaging location?    Answer:   Internal  . Brain natriuretic peptide  . CBC with Differential/Platelet

## 2016-07-26 LAB — CBC WITH DIFFERENTIAL/PLATELET
BASOS: 1 %
Basophils Absolute: 0 10*3/uL (ref 0.0–0.2)
EOS (ABSOLUTE): 0 10*3/uL (ref 0.0–0.4)
Eos: 0 %
Hematocrit: 37.7 % (ref 34.0–46.6)
Hemoglobin: 11.8 g/dL (ref 11.1–15.9)
IMMATURE GRANS (ABS): 0 10*3/uL (ref 0.0–0.1)
IMMATURE GRANULOCYTES: 0 %
LYMPHS: 15 %
Lymphocytes Absolute: 0.7 10*3/uL (ref 0.7–3.1)
MCH: 30.1 pg (ref 26.6–33.0)
MCHC: 31.3 g/dL — ABNORMAL LOW (ref 31.5–35.7)
MCV: 96 fL (ref 79–97)
Monocytes Absolute: 0.5 10*3/uL (ref 0.1–0.9)
Monocytes: 11 %
NEUTROS PCT: 73 %
Neutrophils Absolute: 3.4 10*3/uL (ref 1.4–7.0)
PLATELETS: 177 10*3/uL (ref 150–379)
RBC: 3.92 x10E6/uL (ref 3.77–5.28)
RDW: 14.2 % (ref 12.3–15.4)
WBC: 4.6 10*3/uL (ref 3.4–10.8)

## 2016-07-26 LAB — BRAIN NATRIURETIC PEPTIDE: BNP: 616.8 pg/mL — ABNORMAL HIGH (ref 0.0–100.0)

## 2016-07-29 ENCOUNTER — Ambulatory Visit: Payer: Medicare Other | Admitting: Pulmonary Disease

## 2016-07-29 ENCOUNTER — Encounter: Payer: Self-pay | Admitting: Adult Health

## 2016-07-29 ENCOUNTER — Ambulatory Visit (INDEPENDENT_AMBULATORY_CARE_PROVIDER_SITE_OTHER): Payer: Medicare Other | Admitting: Adult Health

## 2016-07-29 DIAGNOSIS — J181 Lobar pneumonia, unspecified organism: Secondary | ICD-10-CM

## 2016-07-29 DIAGNOSIS — J9611 Chronic respiratory failure with hypoxia: Secondary | ICD-10-CM | POA: Diagnosis not present

## 2016-07-29 DIAGNOSIS — J189 Pneumonia, unspecified organism: Secondary | ICD-10-CM | POA: Insufficient documentation

## 2016-07-29 MED ORDER — AZITHROMYCIN 250 MG PO TABS: 250.0000 mg | ORAL_TABLET | Freq: Once | ORAL | 0 refills | Status: AC

## 2016-07-29 NOTE — Assessment & Plan Note (Signed)
Cont on O2 .  

## 2016-07-29 NOTE — Progress Notes (Signed)
@Patient  ID: [name], female    DOB: [birth year], 83 y.o.   MRN: [id number]  Chief Complaint  Patient presents with  . Follow-up    COPD     Referring provider: Chipper Herb, MD  HPI: 83 year old female former smoker followed for gold 4 COPD, chronic hypoxic respiratory failure on O2  Pulmonary tests PFT 06/08/10>>FEV1 0.93(62%), FEV1% 60, TLC 2.84(69%), DLCO 54%, +BD ONO with 2.5 liters 05/07/12>>Test time 3 hrs 47 min. Mean SpO2 99%, low SpO2 96%. ONO with RA 01/15/16 >>test time 5 hrs 40 min. Mean SpO2 88.6%, low SpO2 77%. Spent 2 hrs 27 min with SpO2 <88% Ambulatory oximetry 04/26/16 >> 87% room air at rest  Cardiac tests Echo 01/20/15 >> EF 45 to 50%, PAS 55 mmHg  07/29/2016 Follow up ; COPD  Patient returns for a three-month follow-up for COPD. Patient says over the last few days, that she's been having increased cough, congestion and malaise. She was seen by her primary care doctor. And diagnosed with pneumonia. Chest x-ray showed a right lower lobe pneumonia. Patient was called in Keflex for 10 days.. Patient says that she's had only minimal improvement in symptoms to continues to call up a large amount of thick yellow to green mucus. She does have intermittent nausea and coughs so hard that she feels that she could vomit..  Patient denies any hemoptysis, chest pain, fever, or body aches.  She is with her son today , they have been having a disagreement . Support provided.  She is on O2 but did not bring it to office today .   Allergies  Allergen Reactions  . Atorvastatin Other (See Comments)     muscle aches  . Codeine Nausea And Vomiting  . Morphine Nausea And Vomiting  . Other Other (See Comments)    OPIATES cause nausea and vomiting  . Rosuvastatin Other (See Comments)    muscle aches at high doses, held as of 12/2010 due to aches  . Sulfa Antibiotics Nausea And Vomiting  . Iohexol Other (See Comments)     Desc: unknown reaction; allergic to iodine and  contrast     Immunization History  Administered Date(s) Administered  . Influenza Split 04/01/2011, 03/12/2012  . Influenza Whole 03/31/2010  . Influenza,inj,Quad PF,36+ Mos 04/01/2013, 04/03/2014, 04/09/2015, 03/17/2016  . Pneumococcal Conjugate-13 01/03/2014    Past Medical History:  Diagnosis Date  . Angina   . Anxiety   . Arthritis    "all over"  . Bleeds easily ([place])   . Bradycardia 08/24/2011  . CAD (coronary artery disease)    Cath in 2006 showed an occluded RCA with left-to-right collaterals & otherwise noncritical CAD with EF=25-30% at that time. EF subsequently improved to normal by 2D ECHO. Cath Aug. 2011 showed unchanged anatomy. > medical therapy recommended  . Carotid artery disease (HCC)    Moderate, right greater than left which we following by duplex ultrasound. [place] 12/05/11 = RIght Bulb/Prox ICA: Moderate to severe amt of fibrous plaque elevating velocities w/in prox segment of ICA. Consistent w/a 50-69% diameter reduction. Left Bulb/Prox ICA: Moderate amt of fibrous plaque slightly elevating velocities w/in the prox segment of the ICA. Consistent w/a 0-49% diameter reduction.   . Carotid bruit   . CHF (congestive heart failure) ([place])    Hospitalized 08/22/11-08/26/11 with CHF secondary to diastolic dysfunction & hospitilized in 07/2012 with a similar problem. TTE 08/23/11 = normal EF.  Marland Kitchen Chronic lower back pain   . COPD (chronic obstructive pulmonary disease) (  [place])    GOLD 4 - PFT 06/08/10 FEV1 0.93 (62%), FEV1% 60, TLC 2.84 (69%0, DLCO 54%, +BD) On home O2.  Marland Kitchen COPD with emphysema ([place])   . Coronary atherosclerosis of unspecified type of vessel, native or graft   . Diastolic dysfunction    Grade II  . Edema   . Fall during current hospitalization    "was at [facility]; fell; transferred to [facility]" (01/13/2015)  . Family history of adverse reaction to anesthesia    "daughter gets PONV"  . GERD (gastroesophageal reflux disease)   . History of stress test 07/02/09   Mild  perfusion defect seen in the Basal Inferior region(s). This is consistent with an infarct/scar.  Post-stress EF=56%. Global LV systolic function is normal.  EKG is negative for ischemia.  No significant iscemia detected. Low risk scan.  . Hyperlipidemia   . Hypertension   . Hypoxemic respiratory failure, chronic (HCC)    uses 2.5 liters of oxygen with sleep  . Lower extremity edema    ECHO 07/21/12 = EF 55-60%, performed for TIA. Responding to diurectics. Continue diuresis w/ a goal dry weight of <160lb. Venous Duplex 07/27/11 = Right lower extremity: no evidence of thrombus or thrombophlebitis. Essentially normal right lower extremity venous duplex Doppler evaluation.  . Mitral valve regurgitation    Mild to moderate by ECHO 07/21/12  . Myocardial infarction    "she's had several" (01/13/2015)  . On home oxygen therapy    "?L; prn" (01/13/2015)  . PAF (paroxysmal atrial fibrillation) (HCC)    In the past, on coumadin  . Pneumonia ~ 2013  . PONV (postoperative nausea and vomiting)   . RBBB (right bundle branch block)    Chronic  . Secondary pulmonary hypertension   . Shortness of breath   . Sleep apnea   . Sleep-related hypoventilation   . Stroke [facility])    "/CT scan; ~ 2014; didn't even know she'd had it"  (01/13/2015)  . Type II diabetes mellitus (HCC)    goal A1C is 8, to avoid hypoglycemia    Tobacco History: History  Smoking Status  . Former Smoker  . Packs/day: 1.50  . Years: 15.00  . Types: Cigarettes  Smokeless Tobacco  . Never Used    Comment: Smoked 1.5 packs per day for 15 years, quit in 1995.   Counseling given: Not Answered   Outpatient Encounter Prescriptions as of 07/29/2016  Medication Sig  . ACCU-CHEK AVIVA PLUS test strip Test tid. Dx E11.9  . acetaminophen (TYLENOL) 500 MG tablet Take 500 mg by mouth every 4 (four) hours as needed for mild pain.   Marland Kitchen ALPRAZolam (XANAX) 0.25 MG tablet TAKE 1/2 TO 1 TABLET 2 TIMES A DAY AS NEEDED FOR ANXIETY  . amiodarone  (PACERONE) 200 MG tablet TAKE (1/2) TABLET DAILY.  Marland Kitchen aspirin EC 81 MG tablet Take 81 mg by mouth daily with breakfast.   . atorvastatin (LIPITOR) 20 MG tablet TAKE 1 TABLET DAILY IN THE EVENING (Patient taking differently: TAKE 20 mg by mouth at bedtime)  . Azelastine HCl 0.15 % SOLN Place 1 spray into both nostrils 2 (two) times daily as needed (congestion).  . baclofen (LIORESAL) 10 MG tablet TAKE 1/2 TABLET EVERY 8 HOURS AS NEEDED FOR MUSCLE SPASM  . budesonide-formoterol (SYMBICORT) 160-4.5 MCG/ACT inhaler Inhale 1 puff into the lungs 2 (two) times daily. (Patient taking differently: Inhale 1 puff into the lungs 2 (two) times daily as needed (wheezing). )  . cephALEXin (KEFLEX) 500 MG capsule  Take 1 capsule (500 mg total) by mouth 3 (three) times daily.  . cholecalciferol (VITAMIN D) 1000 units tablet Take 1,000 Units by mouth daily.  Marland Kitchen docusate sodium (COLACE) 100 MG capsule Take 100 mg by mouth daily as needed.   . ferrous sulfate 325 (65 FE) MG tablet TAKE 1 TABLET ONCE DAILY WITH BREAKFAST  . fluticasone (FLONASE) 50 MCG/ACT nasal spray Place 1 spray into both nostrils 2 (two) times daily. (Patient taking differently: Place 1 spray into both nostrils as needed. )  . glucosamine-chondroitin 500-400 MG tablet Take 1 tablet by mouth 2 (two) times daily.  Marland Kitchen guaifenesin (HUMIBID E) 400 MG TABS tablet Take 600 mg by mouth 2 (two) times daily.  Marland Kitchen HUMALOG KWIKPEN 100 UNIT/ML KiwkPen INJECT UP TO 8 UNITS BEFORE MEALS AS DIRECTED BY SLIDING SCALE. MAX 24 UNITS PER DAY  . hydrocortisone (ANUSOL-HC) 25 MG suppository Place 1 suppository (25 mg total) rectally 2 (two) times daily as needed for hemorrhoids or itching.  . Insulin Pen Needle (PEN NEEDLES) 31G X 6 MM MISC Use to administer insulin up to qid  . Ipratropium-Albuterol (COMBIVENT) 20-100 MCG/ACT AERS respimat Inhale 1 puff into the lungs every 6 (six) hours as needed for wheezing or shortness of breath.  Marland Kitchen LEVEMIR FLEXTOUCH 100 UNIT/ML Pen  INJECT UP TO 20 UNITS SQ EVERY MORNING AS DIRECTED (Patient taking differently: INJECT 8 units once a day each morning)  . metoprolol succinate (TOPROL-XL) 25 MG 24 hr tablet TAKE (1/2) TABLET DAILY. (Patient taking differently: Take 25mg  by mouth in the morning)  . Multiple Vitamins-Minerals (CENTRUM ADULTS) TABS Take 1 tablet by mouth daily with breakfast.  . Multiple Vitamins-Minerals (PRESERVISION AREDS) CAPS Take 1 capsule by mouth daily with breakfast.  . NIFEdipine (PROCARDIA-XL/ADALAT-CC/NIFEDICAL-XL) 30 MG 24 hr tablet TAKE 1 TABLET ONCE DAILY  . nitroGLYCERIN (NITROSTAT) 0.4 MG SL tablet Place 1 tablet (0.4 mg total) under the tongue every 5 (five) minutes as needed for chest pain.  Marland Kitchen ondansetron (ZOFRAN ODT) 4 MG disintegrating tablet Take 1 tablet (4 mg total) by mouth every 8 (eight) hours as needed for nausea or vomiting.  . OXYGEN Inhale 2.5 L into the lungs daily.  . pantoprazole (PROTONIX) 40 MG tablet Take 1 tablet (40 mg total) by mouth daily. (Patient taking differently: Take 40 mg by mouth daily with breakfast. )  . polyethylene glycol (MIRALAX / GLYCOLAX) packet Take 17 g by mouth 2 (two) times daily as needed for moderate constipation.  . polyethylene glycol powder (GLYCOLAX/MIRALAX) powder Take 17 g by mouth daily as needed. (Patient taking differently: Take 17 g by mouth daily as needed for mild constipation. )  . potassium chloride SA (K-DUR,KLOR-CON) 20 MEQ tablet TAKE 1 TABLET DAILY  . PRODIGY TWIST TOP LANCETS 28G MISC CHECK BLOOD SUGAR UP TO 4 TIMES A DAY  . simethicone (MYLICON) 80 MG chewable tablet Chew 80 mg by mouth at bedtime as needed for flatulence.  . torsemide (DEMADEX) 20 MG tablet TAKE  (1)  TABLET TWICE A DAY.  . traMADol (ULTRAM) 50 MG tablet Take 1 tablet (50 mg total) by mouth every 6 (six) hours as needed for severe pain.  . vitamin C (ASCORBIC ACID) 500 MG tablet Take 500 mg by mouth at bedtime.   Marland Kitchen warfarin (COUMADIN) 4 MG tablet Take 1.5 tablets (6  mg total) by mouth daily.  Marland Kitchen azithromycin (ZITHROMAX) 250 MG tablet Take 1 tablet (250 mg total) by mouth once.   No facility-administered encounter medications on  file as of 07/29/2016.      Review of Systems  Constitutional:   No  weight loss, night sweats,  Fevers, chills, +fatigue, or  lassitude.  HEENT:   No headaches,  Difficulty swallowing,  Tooth/dental problems, or  Sore throat,                No sneezing, itching, ear ache, + nasal congestion, post nasal drip,   CV:  No chest pain,  Orthopnea, PND, swelling in lower extremities, anasarca, dizziness, palpitations, syncope.   GI  No heartburn, indigestion, abdominal pain, nausea, vomiting, diarrhea, change in bowel habits, loss of appetite, bloody stools.   Resp:    No wheezing.  No chest wall deformity  Skin: no rash or lesions.  GU: no dysuria, change in color of urine, no urgency or frequency.  No flank pain, no hematuria   MS:  No joint pain or swelling.  No decreased range of motion.  No back pain.    Physical Exam  BP 122/64 (BP Location: Left Arm, Cuff Size: Normal)   Pulse 82   Ht 5' (1.524 m)   Wt 116 lb (52.6 kg)   SpO2 (!) 89%   BMI 22.65 kg/m   GEN: A/Ox3; pleasant , NAD, elderly and frail    HEENT:  [place]/AT,  EACs-clear, TMs-wnl, NOSE-clear drainage , THROAT-clear, no lesions, no postnasal drip or exudate noted.   NECK:  Supple w/ fair ROM; no JVD; normal carotid impulses w/o bruits; no thyromegaly or nodules palpated; no lymphadenopathy.    RESP  Few rhonchi, . no accessory muscle use, no dullness to percussion  CARD:  RRR, no m/r/g, no peripheral edema, pulses intact, no cyanosis or clubbing.  GI:   Soft & nt; nml bowel sounds; no organomegaly or masses detected.   Musco: Warm bil, no deformities or joint swelling noted.   Neuro: alert, no focal deficits noted.    Skin: Warm, no lesions or rashes  Psych:  No change in mood or affect. No depression or anxiety.  No memory loss.  Lab  Results:  CBC  ProBNPImaging: Dg Chest 2 View  Result Date: 07/26/2016 CLINICAL DATA:  Cough and congestion four 3 days. EXAM: CHEST  2 VIEW COMPARISON:  05/08/2016 FINDINGS: Mild to moderate enlargement of the cardiopericardial silhouette, stable. No mediastinal or hilar masses. No convincing adenopathy. There is airspace opacity in the medial right lower lobe projecting over the right heart border on the frontal view. No other focal lung consolidation. There are thickened interstitial markings bilaterally, chronic. Lungs are hyperexpanded. No pleural effusion.  No pneumothorax. The skeletal structures are demineralized. There is a dextroscoliosis at the thoracolumbar junction. IMPRESSION: 1. Right lower lobe pneumonia. 2. No other acute findings. Cardiomegaly with chronic interstitial thickening. Electronically Signed   By: Lajean Manes M.D.   On: 07/26/2016 08:17     Assessment & Plan:   CAP (community acquired pneumonia) RLL CAP -w/ perisstent cough/congestion with underlying COPD Will add Zithromax to her current regimen  Discussed hospital admission , she declines but assures me she will go if not getting better or gets worse.   Plan  Patient Instructions  Finish Keflex as directed.  Add Zithromax 250mg  daily for 7 days .  Mucinex DM Twice daily  As needed cough/congestion .  Continue on Symbicort  Continue on oxygen 2l/m .  Follow up in 1 week with chest xray and As needed   Please contact office for sooner follow up if symptoms do  not improve or worsen or seek emergency care  If you are getting worse , will need to admitted to hospital .       Chronic respiratory failure with hypoxia Cont on O2     Tammy Parrett, NP 07/29/2016

## 2016-07-29 NOTE — Assessment & Plan Note (Signed)
RLL CAP -w/ perisstent cough/congestion with underlying COPD Will add Zithromax to her current regimen  Discussed hospital admission , she declines but assures me she will go if not getting better or gets worse.   Plan  Patient Instructions  Finish Keflex as directed.  Add Zithromax 250mg  daily for 7 days .  Mucinex DM Twice daily  As needed cough/congestion .  Continue on Symbicort  Continue on oxygen 2l/m .  Follow up in 1 week with chest xray and As needed   Please contact office for sooner follow up if symptoms do not improve or worsen or seek emergency care  If you are getting worse , will need to admitted to hospital .

## 2016-07-29 NOTE — Progress Notes (Signed)
Please make sure she gets a ProTime at the appropriate time with adding Zithromax to her Keflex

## 2016-07-29 NOTE — Patient Instructions (Addendum)
Finish Keflex as directed.  Add Zithromax 250mg  daily for 7 days .  Mucinex DM Twice daily  As needed cough/congestion .  Continue on Symbicort  Continue on oxygen 2l/m .  Follow up in 1 week with chest xray and As needed   Please contact office for sooner follow up if symptoms do not improve or worsen or seek emergency care  If you are getting worse , will need to admitted to hospital .

## 2016-07-30 NOTE — Progress Notes (Signed)
I have reviewed and agree with assessment/plan.  Chesley Mires, MD [facility] Pulmonary/Critical Care 07/30/2016, 11:51 PM Pager:  [phone]

## 2016-08-01 ENCOUNTER — Telehealth: Payer: Self-pay | Admitting: Adult Health

## 2016-08-01 NOTE — Telephone Encounter (Signed)
Spoke with pharmacist, reviewed rx recs as discussed with pt on 07/29/16 office visit with TP.  Nothing further needed.

## 2016-08-01 NOTE — Progress Notes (Signed)
[facility] has already worked with pt. [facility] not taking any managed care right now, [facility] not taking any [employer]. Dr. Darlyn Chamber and pt aware to call Barnett Applebaum for any problems for assist

## 2016-08-02 ENCOUNTER — Encounter: Payer: Self-pay | Admitting: Family Medicine

## 2016-08-02 ENCOUNTER — Ambulatory Visit (INDEPENDENT_AMBULATORY_CARE_PROVIDER_SITE_OTHER): Payer: Medicare Other

## 2016-08-02 ENCOUNTER — Ambulatory Visit (INDEPENDENT_AMBULATORY_CARE_PROVIDER_SITE_OTHER): Payer: Medicare Other | Admitting: Family Medicine

## 2016-08-02 VITALS — BP 147/80 | HR 60 | Temp 98.1°F

## 2016-08-02 DIAGNOSIS — J181 Lobar pneumonia, unspecified organism: Secondary | ICD-10-CM | POA: Diagnosis not present

## 2016-08-02 DIAGNOSIS — J189 Pneumonia, unspecified organism: Secondary | ICD-10-CM

## 2016-08-02 DIAGNOSIS — I480 Paroxysmal atrial fibrillation: Secondary | ICD-10-CM | POA: Diagnosis not present

## 2016-08-02 LAB — COAGUCHEK XS/INR WAIVED
INR: 2.2 — ABNORMAL HIGH (ref 0.9–1.1)
PROTHROMBIN TIME: 26.5 s

## 2016-08-02 LAB — POCT INR: INR: 2.2

## 2016-08-02 IMAGING — CT CT HEAD W/O CM
3 of 5 series · 15 of 47 positions shown, 18 images · non-contrast
Comparison: [facility] [HOSPITAL] Head CT without contrast [id number]
[id number], 01/08/2015. Cervical spine radiographs 09/05/2013. Cervical
spine CT 07/10/2011.

ADDENDUM:
Study discussed by telephone with Dr. SOMOD MEDHANE on 01/14/2015 at
2401 hours.
CLINICAL DATA: 86-year-old female who fell and was unconscious.
Headache and cervical neck pain. Initial encounter.

EXAM:
CT HEAD WITHOUT CONTRAST
CT CERVICAL SPINE WITHOUT CONTRAST
TECHNIQUE: Multidetector CT imaging of the head and cervical spine was
performed following the standard protocol without intravenous
contrast. Multiplanar CT image reconstructions of the cervical spine
were also generated.

[Series 3: head 2.0 h70h · axial · 0.43mm/px · z∈[+1022,+1162]mm · 9 of 84 slices shown, 12 images]
[im 7/84  brain]
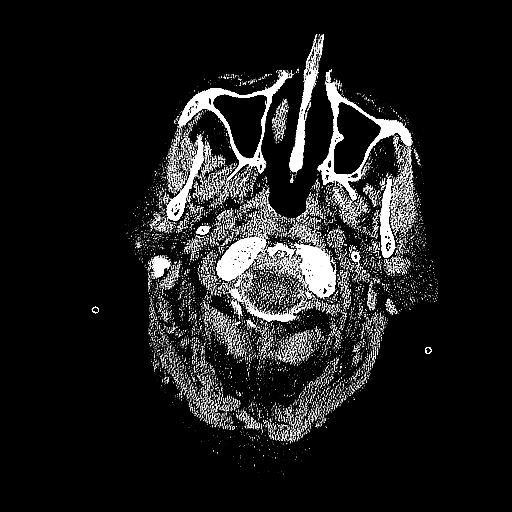
[im 7/84  bone]
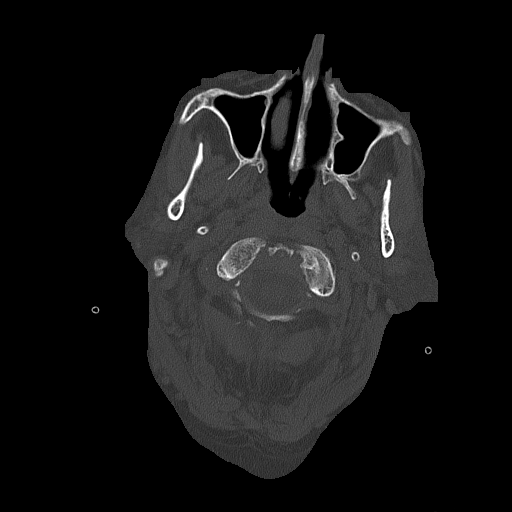
[im 20/84  brain]
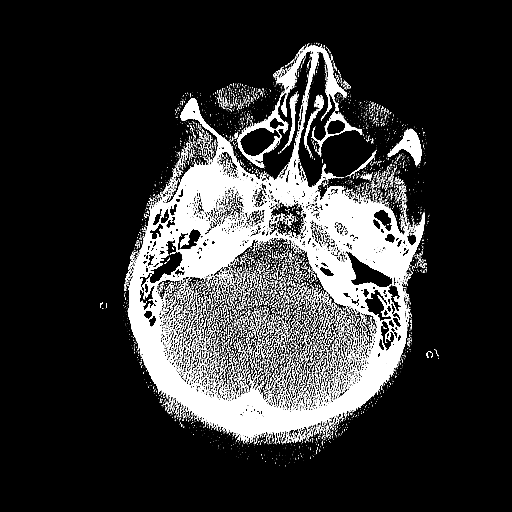
[im 26/84  brain]
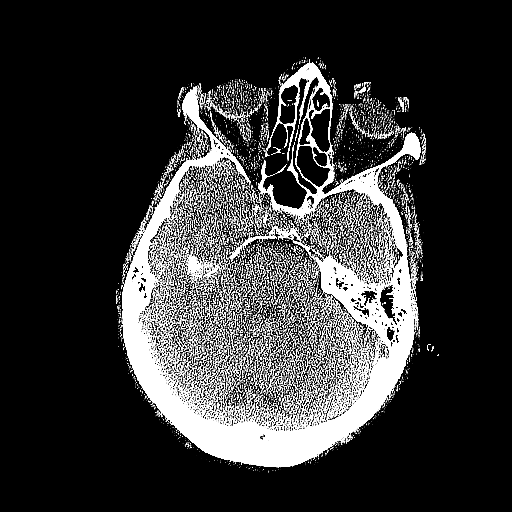
[im 32/84  brain]
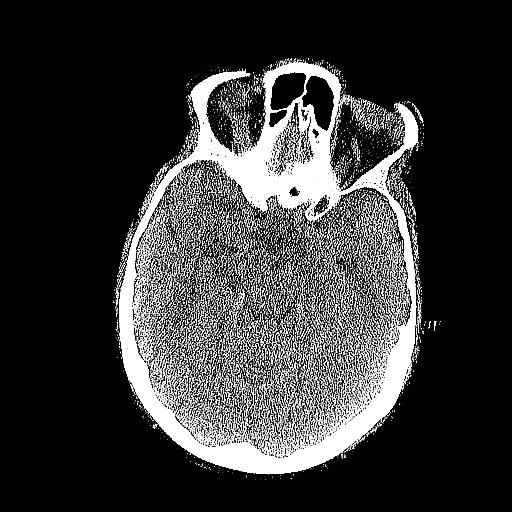
[im 45/84  brain]
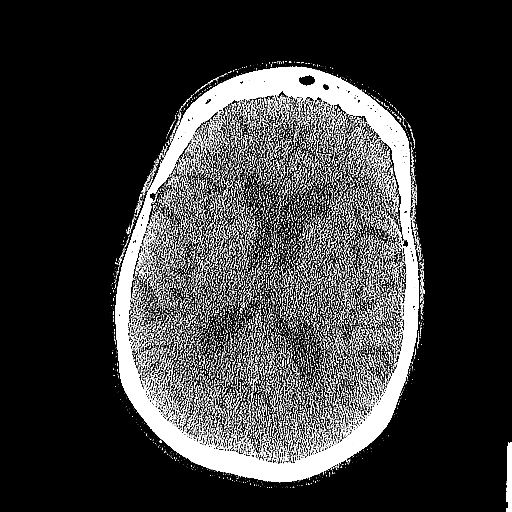
[im 45/84  bone]
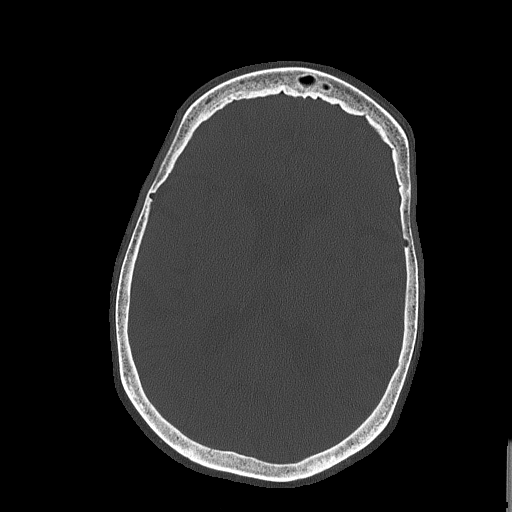
[im 52/84  brain]
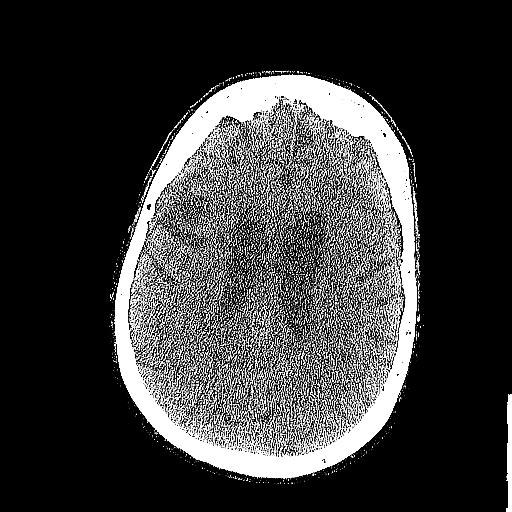
[im 58/84  brain]
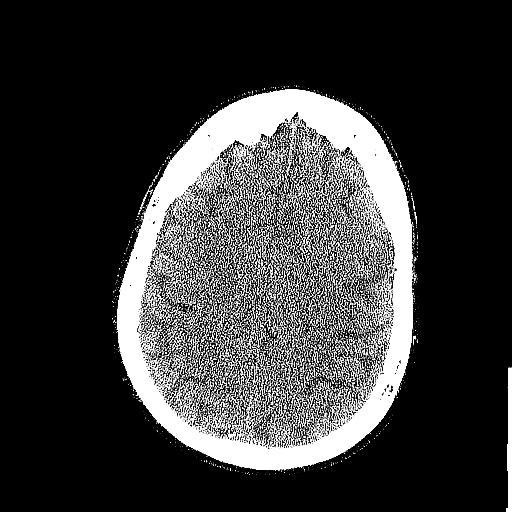
[im 71/84  brain]
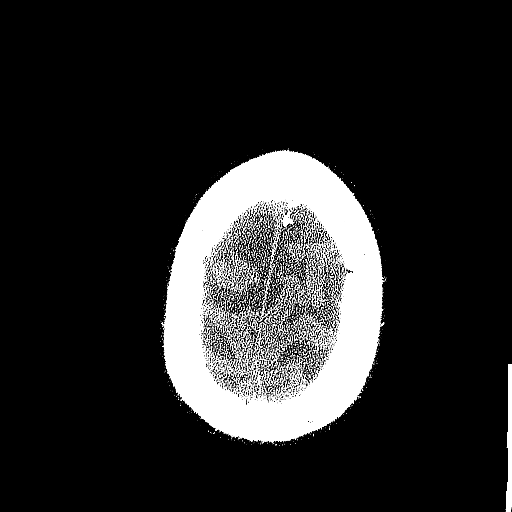
[im 77/84  brain]
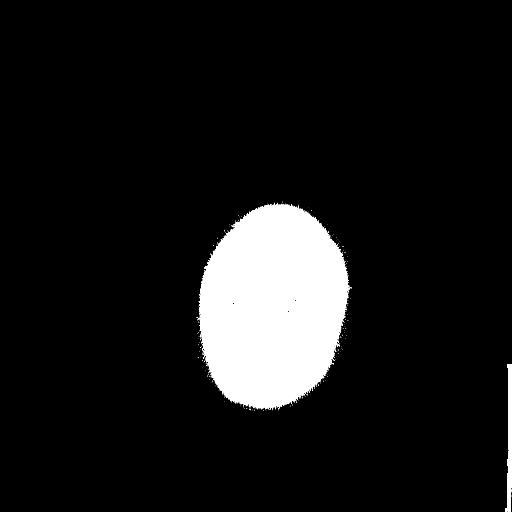
[im 77/84  bone]
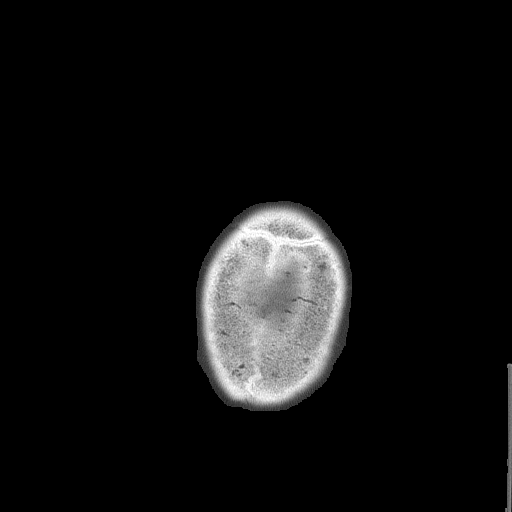

[Series 7: coronals · coronal · 0.27mm/px · 3 of 73 slices shown]
[im 25/73  brain]
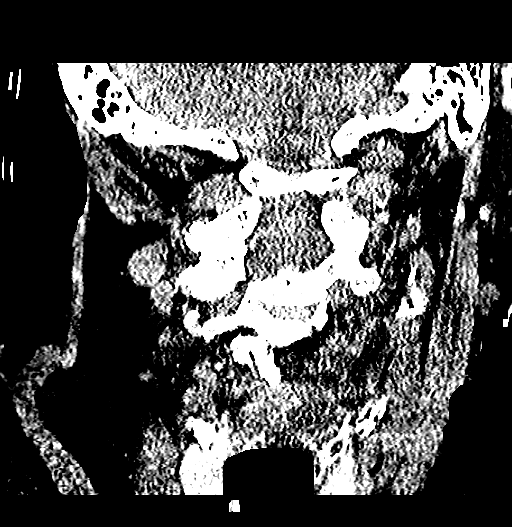
[im 33/73  brain]
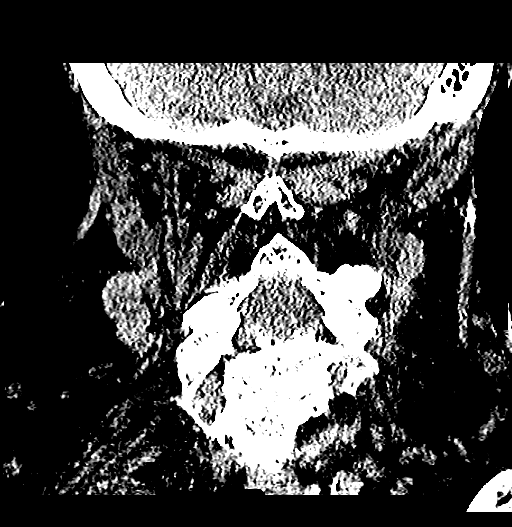
[im 41/73  brain]
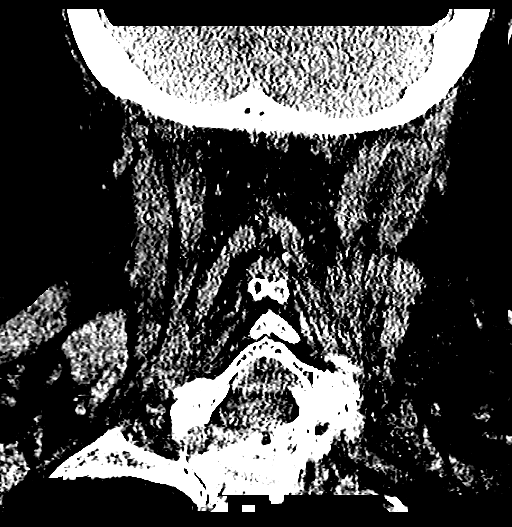

[Series 8: sagittals · sagittal · 0.28mm/px · 3 of 73 slices shown]
[im 25/73  brain]
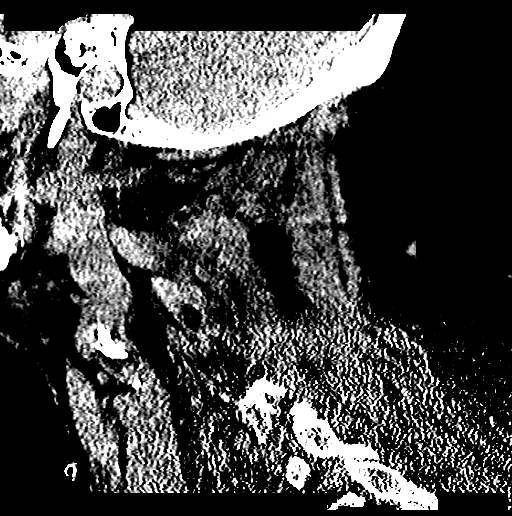
[im 37/73  brain]
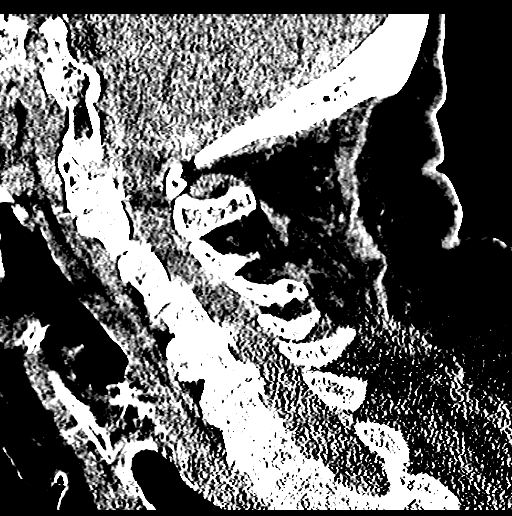
[im 49/73  brain]
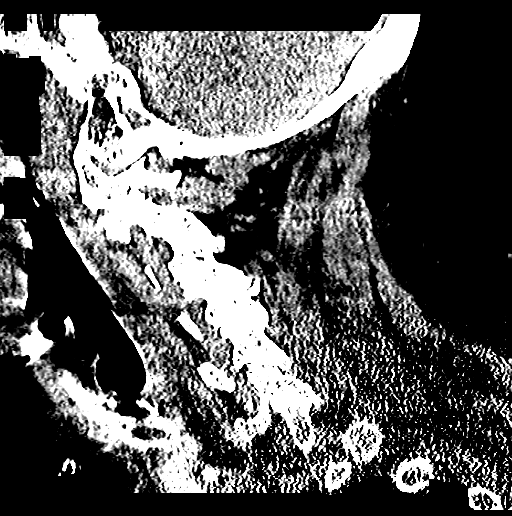

[15 of 47 positions shown; findings below may reference images not displayed]

FINDINGS: CT HEAD FINDINGS

Paranasal sinuses and mastoids appear stable and clear. Periapical
left maxillary molar dental lucency appears inflammatory in nature.
No calvarium fracture identified. No scalp hematoma. Orbits soft
tissues appear stable.

Chronic Calcified atherosclerosis at the skull base. Cerebral volume
is stable and normal for age. No ventriculomegaly. No midline shift,
mass effect, or evidence of intracranial mass lesion. No acute
intracranial hemorrhage identified. No evidence of cortically based
acute infarction identified. Normal for age gray-white matter
differentiation.

CT CERVICAL SPINE FINDINGS

Multilevel chronic cervical spondylolisthesis. Pronounced chronic
anterolisthesis at C3-C4 appears stable since 3447. Mild
anterolisthesis at C2-C3 and C7-T1 also appear stable. Associated
chronic cervical facet arthropathy. Bilateral cervical posterior
element alignment appears stable. Superimposed severe chronic lower
cervical disc and endplate with chronic endplate osteophytosis.
Degeneration occipital condyles to C1 alignment appears stable.
Chronic C1-C2 degeneration may be mildly progressed since 3447.
Stable C1-C2 alignment degenerative odontoid tip subchondral cysts
do appear increased. No acute cervical spine fracture identified.

However, there is a small volume of abnormal prevertebral soft
tissue fluid which is new. This is primarily about the C4 level
(series 5, image 48).

Chronic calcified carotid atherosclerosis in the neck. Stable other
noncontrast paraspinal soft tissues. Grossly intact visualized upper
thoracic levels.
IMPRESSION: 1. New abnormal prevertebral fluid at the C4 level, suspicious for
acute cervical anterior ligamentous injury in this setting. Cervical
spine MRI without contrast would evaluate further.
2. No acute fracture or listhesis identified in the cervical spine.
Chronic advanced cervical spondylolisthesis and degenerative
changes.
3. Stable and negative for age noncontrast CT appearance of the
brain.

## 2016-08-02 NOTE — Patient Instructions (Signed)
Anticoagulation Dose Instructions as of 08/02/2016      [name] Tue Wed Thu Fri Sat   New Dose 6 mg 4 mg 6 mg 6 mg 4 mg 6 mg 6 mg    Description   INR is 2.2 today, has increased quickly with the 6 mg, will decrease 2 doses on Monday and Thursday to 1 pill.

## 2016-08-02 NOTE — Progress Notes (Signed)
BP (!) 147/80   Pulse 60   Temp 98.1 F (36.7 C) (Oral)    Subjective:    Patient ID: [name], female    DOB: [birth year], 83 y.o.   MRN: [id number]  HPI: [name] is a 83 y.o. female presenting on 08/02/2016 for Atrial Fibrillation (PT done); Pneumonia (followup); and Edema (& BNP elevation)   HPI A. fib Patient is coming in for an INR rechecked for her atrial fibrillation. She is currently on Coumadin 6 mg daily which was increased because she is on an antibiotic and had an INR 1.3 on the last occasion. She denies any focal numbness or weakness or vision troubles.  Congestion and cough and pneumonia follow-up Patient comes in today for cough and congestion and pneumonia follow-up. She says she that she was diagnosed with pneumonia about a week ago and started on Keflex and then saw pulmonologist that it azithromycin. Per patient's daughter who is here with her today the breathing has improved and the congestion has improved. She still having the cough and the cough is productive but things are improving. She denies any fevers or chills or shortness of breath and wheezing.  Relevant past medical, surgical, family and social history reviewed and updated as indicated. Interim medical history since our last visit reviewed. Allergies and medications reviewed and updated.  Review of Systems  Constitutional: Negative for chills and fever.  HENT: Positive for congestion. Negative for ear discharge, ear pain, rhinorrhea, sinus pain, sinus pressure and sore throat.   Respiratory: Positive for cough and shortness of breath (increased oxygen use). Negative for chest tightness and wheezing.   Cardiovascular: Negative for chest pain and leg swelling.  Genitourinary: Negative for difficulty urinating and dysuria.  Musculoskeletal: Negative for back pain and gait problem.  Skin: Negative for rash.  Neurological: Negative for dizziness, light-headedness and headaches.    Psychiatric/Behavioral: Negative for agitation and behavioral problems.  All other systems reviewed and are negative.   Per HPI unless specifically indicated above     Objective:    BP (!) 147/80   Pulse 60   Temp 98.1 F (36.7 C) (Oral)   Wt Readings from Last 3 Encounters:  07/29/16 116 lb (52.6 kg)  07/20/16 119 lb (54 kg)  07/15/16 124 lb 3.2 oz (56.3 kg)    Physical Exam  Constitutional: She is oriented to person, place, and time. She appears well-developed and well-nourished. No distress.  Eyes: Conjunctivae are normal.  Cardiovascular: Normal rate, S1 normal, S2 normal, normal heart sounds and intact distal pulses.  An irregularly irregular rhythm present.  No murmur heard. Pulmonary/Chest: Effort normal. No respiratory distress. She has no decreased breath sounds. She has no wheezes. She has rhonchi in the right upper field and the left upper field. She has no rales.  Musculoskeletal: Normal range of motion. She exhibits no edema or tenderness.  Neurological: She is alert and oriented to person, place, and time. Coordination normal.  Skin: Skin is warm and dry. No rash noted. She is not diaphoretic.  Psychiatric: She has a normal mood and affect. Her behavior is normal.  Nursing note and vitals reviewed.   Results for orders placed or performed in visit on 08/02/16  CoaguChek XS/INR Waived  Result Value Ref Range   INR 2.2 (H) 0.9 - 1.1   Prothrombin Time 26.5 sec  POCT INR  Result Value Ref Range   INR 2.2    Chest x-ray: Is x-ray still shows consolidation in right lobe,  does appear to have some minimal improvement from previous but it's only been a short week. We will continue to monitor in the future. Await final radiologist read.     Anticoagulation Dose Instructions as of 08/02/2016      Dorene Grebe Tue Wed Thu Fri Sat   New Dose 6 mg 4 mg 6 mg 6 mg 4 mg 6 mg 6 mg    Description   INR is 2.2 today, has increased quickly with the 6 mg, will decrease 2 doses on  Monday and Thursday to 1 pill.      Assessment & Plan:   Problem List Items Addressed This Visit      Cardiovascular and Mediastinum   PAF, recurrance this admission, on chronic Amio/ Coumadin (Chronic)     Respiratory   CAP (community acquired pneumonia) - Primary   Relevant Medications   azithromycin (ZITHROMAX) 250 MG tablet   Other Relevant Orders   DG Chest 2 View (Completed)      Continue current medications for pneumonia and return if worsens.  Follow up plan: Return if symptoms worsen or fail to improve, for Follow-up A. fib recheck in 1 month.  Counseling provided for all of the vaccine components Orders Placed This Encounter  Procedures  . DG Chest 2 View  . POCT INR    Caryl Pina, MD [facility] 08/02/2016, 4:45 PM

## 2016-08-03 IMAGING — CR DG PORTABLE PELVIS
1 series · 1 of 1 positions shown · non-contrast
Comparison: Radiograph dated [DATE]

CLINICAL DATA: 86-year-old female with post of for left hip
fracture.

EXAM:
PORTABLE PELVIS 1-2 VIEWS

[AP]
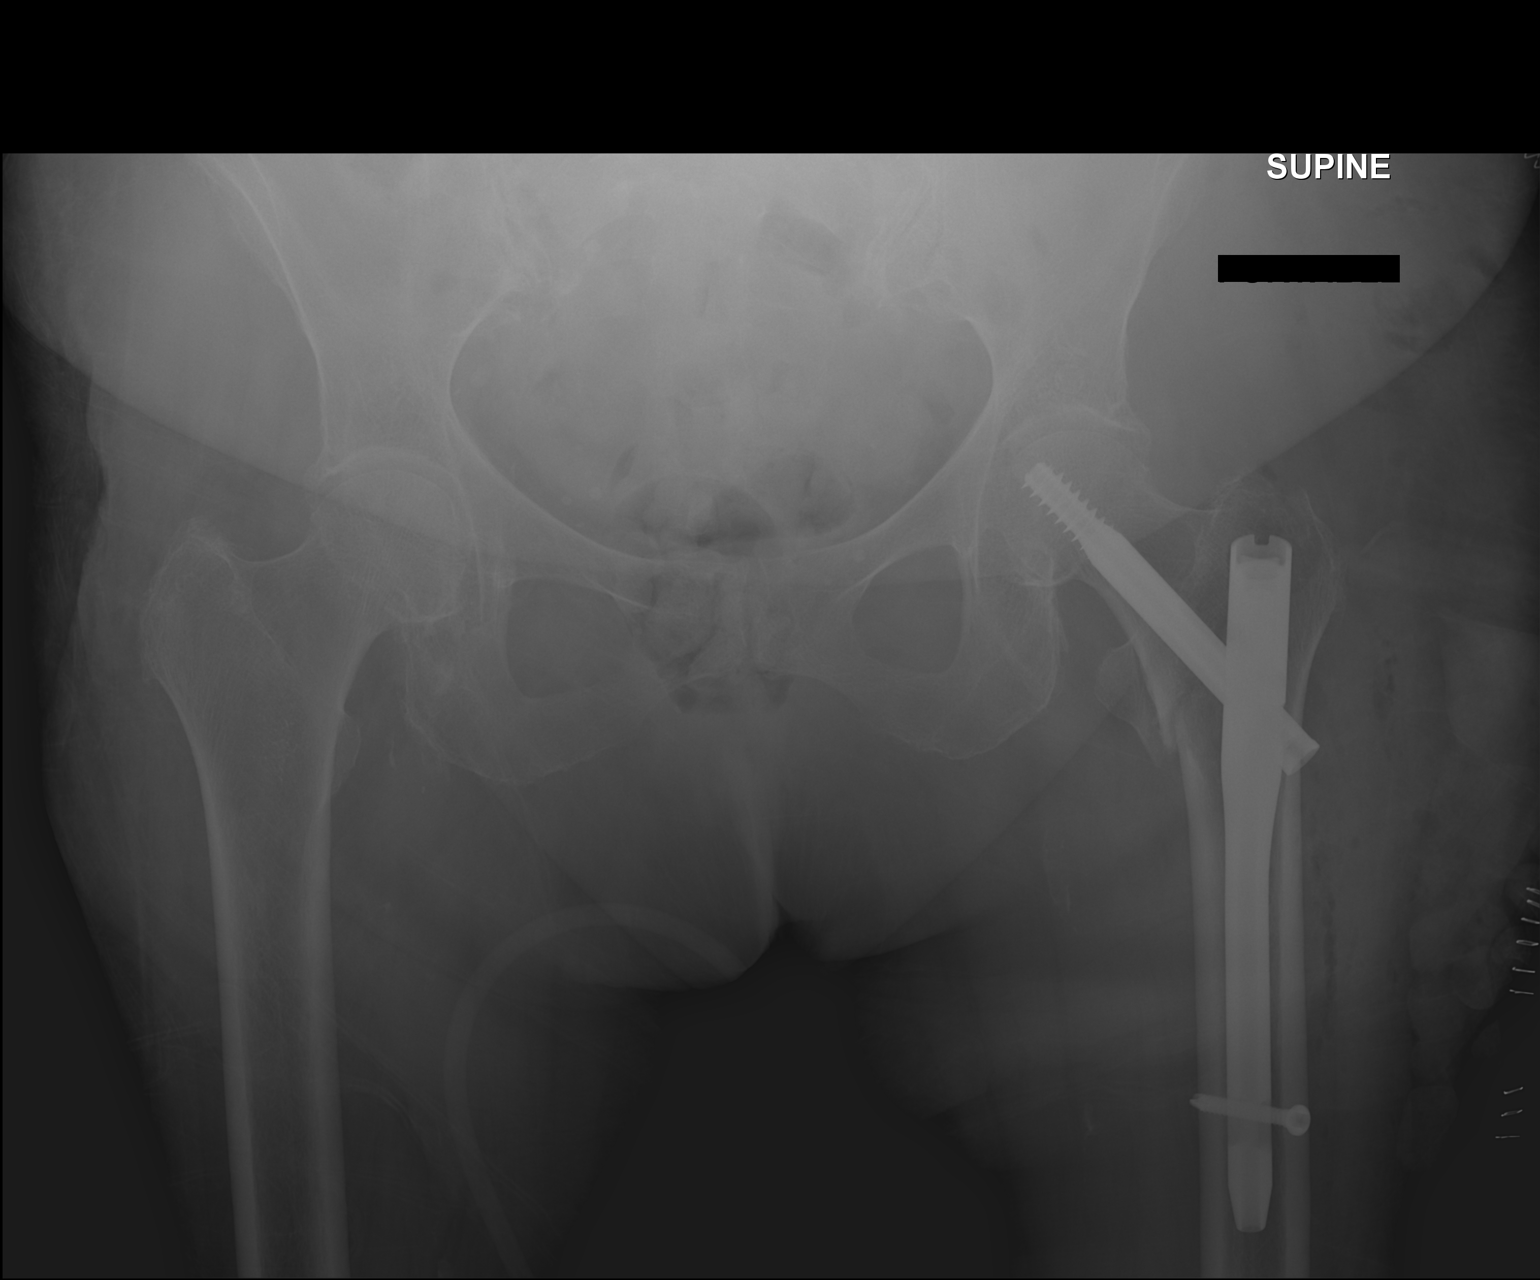

[1 of 1 positions shown; findings below may reference images not displayed]

FINDINGS: There has been interval placement of an intra medullary rod and
screw traversing femoral intertrochanteric fracture. The bones are
osteopenic. There are osteoarthritic changes of the hip joints. No
acute fracture identified.
IMPRESSION: Left femoral intertrochanteric fracture status post placement of
orthopedic hardware.

## 2016-08-04 ENCOUNTER — Ambulatory Visit: Payer: Self-pay | Admitting: Pharmacist

## 2016-08-07 ENCOUNTER — Encounter (HOSPITAL_COMMUNITY): Payer: Self-pay | Admitting: Emergency Medicine

## 2016-08-07 ENCOUNTER — Emergency Department (HOSPITAL_COMMUNITY): Payer: Medicare Other

## 2016-08-07 ENCOUNTER — Inpatient Hospital Stay (HOSPITAL_COMMUNITY)
Admission: EM | Admit: 2016-08-07 | Discharge: 2016-08-12 | DRG: 291 | Disposition: A | Discharge status: Home / Self Care | Payer: Medicare Other | Attending: Internal Medicine | Admitting: Internal Medicine

## 2016-08-07 DIAGNOSIS — I480 Paroxysmal atrial fibrillation: Secondary | ICD-10-CM | POA: Diagnosis present

## 2016-08-07 DIAGNOSIS — Z794 Long term (current) use of insulin: Secondary | ICD-10-CM

## 2016-08-07 DIAGNOSIS — J439 Emphysema, unspecified: Secondary | ICD-10-CM | POA: Diagnosis present

## 2016-08-07 DIAGNOSIS — I2510 Atherosclerotic heart disease of native coronary artery without angina pectoris: Secondary | ICD-10-CM | POA: Diagnosis present

## 2016-08-07 DIAGNOSIS — I340 Nonrheumatic mitral (valve) insufficiency: Secondary | ICD-10-CM | POA: Diagnosis present

## 2016-08-07 DIAGNOSIS — E1122 Type 2 diabetes mellitus with diabetic chronic kidney disease: Secondary | ICD-10-CM | POA: Diagnosis present

## 2016-08-07 DIAGNOSIS — G4730 Sleep apnea, unspecified: Secondary | ICD-10-CM | POA: Diagnosis not present

## 2016-08-07 DIAGNOSIS — Z8673 Personal history of transient ischemic attack (TIA), and cerebral infarction without residual deficits: Secondary | ICD-10-CM | POA: Diagnosis not present

## 2016-08-07 DIAGNOSIS — R262 Difficulty in walking, not elsewhere classified: Secondary | ICD-10-CM

## 2016-08-07 DIAGNOSIS — I5043 Acute on chronic combined systolic (congestive) and diastolic (congestive) heart failure: Secondary | ICD-10-CM | POA: Diagnosis present

## 2016-08-07 DIAGNOSIS — J9611 Chronic respiratory failure with hypoxia: Secondary | ICD-10-CM | POA: Diagnosis not present

## 2016-08-07 DIAGNOSIS — Z9981 Dependence on supplemental oxygen: Secondary | ICD-10-CM

## 2016-08-07 DIAGNOSIS — K219 Gastro-esophageal reflux disease without esophagitis: Secondary | ICD-10-CM | POA: Diagnosis present

## 2016-08-07 DIAGNOSIS — N183 Chronic kidney disease, stage 3 unspecified: Secondary | ICD-10-CM | POA: Diagnosis present

## 2016-08-07 DIAGNOSIS — D649 Anemia, unspecified: Secondary | ICD-10-CM | POA: Diagnosis not present

## 2016-08-07 DIAGNOSIS — Z825 Family history of asthma and other chronic lower respiratory diseases: Secondary | ICD-10-CM

## 2016-08-07 DIAGNOSIS — I2729 Other secondary pulmonary hypertension: Secondary | ICD-10-CM | POA: Diagnosis not present

## 2016-08-07 DIAGNOSIS — Z7901 Long term (current) use of anticoagulants: Secondary | ICD-10-CM | POA: Diagnosis not present

## 2016-08-07 DIAGNOSIS — I252 Old myocardial infarction: Secondary | ICD-10-CM | POA: Diagnosis not present

## 2016-08-07 DIAGNOSIS — Z9861 Coronary angioplasty status: Secondary | ICD-10-CM

## 2016-08-07 DIAGNOSIS — E785 Hyperlipidemia, unspecified: Secondary | ICD-10-CM | POA: Diagnosis present

## 2016-08-07 DIAGNOSIS — R269 Unspecified abnormalities of gait and mobility: Secondary | ICD-10-CM | POA: Diagnosis not present

## 2016-08-07 DIAGNOSIS — I10 Essential (primary) hypertension: Secondary | ICD-10-CM | POA: Diagnosis present

## 2016-08-07 DIAGNOSIS — Z87891 Personal history of nicotine dependence: Secondary | ICD-10-CM | POA: Diagnosis not present

## 2016-08-07 DIAGNOSIS — H9190 Unspecified hearing loss, unspecified ear: Secondary | ICD-10-CM | POA: Diagnosis present

## 2016-08-07 DIAGNOSIS — J811 Chronic pulmonary edema: Secondary | ICD-10-CM | POA: Diagnosis not present

## 2016-08-07 DIAGNOSIS — I509 Heart failure, unspecified: Secondary | ICD-10-CM | POA: Diagnosis not present

## 2016-08-07 DIAGNOSIS — Z955 Presence of coronary angioplasty implant and graft: Secondary | ICD-10-CM

## 2016-08-07 DIAGNOSIS — R001 Bradycardia, unspecified: Secondary | ICD-10-CM | POA: Diagnosis present

## 2016-08-07 DIAGNOSIS — I130 Hypertensive heart and chronic kidney disease with heart failure and stage 1 through stage 4 chronic kidney disease, or unspecified chronic kidney disease: Principal | ICD-10-CM | POA: Diagnosis present

## 2016-08-07 DIAGNOSIS — I110 Hypertensive heart disease with heart failure: Secondary | ICD-10-CM | POA: Diagnosis not present

## 2016-08-07 DIAGNOSIS — R609 Edema, unspecified: Secondary | ICD-10-CM | POA: Diagnosis not present

## 2016-08-07 DIAGNOSIS — I4510 Unspecified right bundle-branch block: Secondary | ICD-10-CM | POA: Diagnosis not present

## 2016-08-07 DIAGNOSIS — Z8249 Family history of ischemic heart disease and other diseases of the circulatory system: Secondary | ICD-10-CM

## 2016-08-07 DIAGNOSIS — I259 Chronic ischemic heart disease, unspecified: Secondary | ICD-10-CM | POA: Diagnosis not present

## 2016-08-07 DIAGNOSIS — Z8701 Personal history of pneumonia (recurrent): Secondary | ICD-10-CM

## 2016-08-07 DIAGNOSIS — J449 Chronic obstructive pulmonary disease, unspecified: Secondary | ICD-10-CM | POA: Diagnosis not present

## 2016-08-07 DIAGNOSIS — I5023 Acute on chronic systolic (congestive) heart failure: Secondary | ICD-10-CM | POA: Diagnosis not present

## 2016-08-07 DIAGNOSIS — R0902 Hypoxemia: Secondary | ICD-10-CM | POA: Diagnosis not present

## 2016-08-07 LAB — COMPREHENSIVE METABOLIC PANEL
ALBUMIN: 3.1 g/dL — AB (ref 3.5–5.0)
ALT: 43 U/L (ref 14–54)
ANION GAP: 7 (ref 5–15)
AST: 28 U/L (ref 15–41)
Alkaline Phosphatase: 79 U/L (ref 38–126)
BUN: 25 mg/dL — AB (ref 6–20)
CHLORIDE: 98 mmol/L — AB (ref 101–111)
CO2: 36 mmol/L — AB (ref 22–32)
Calcium: 9.5 mg/dL (ref 8.9–10.3)
Creatinine, Ser: 1.27 mg/dL — ABNORMAL HIGH (ref 0.44–1.00)
GFR calc Af Amer: 43 mL/min — ABNORMAL LOW (ref >60)
GFR calc non Af Amer: 37 mL/min — ABNORMAL LOW (ref >60)
GLUCOSE: 219 mg/dL — AB (ref 65–99)
Potassium: 4.1 mmol/L (ref 3.5–5.1)
SODIUM: 141 mmol/L (ref 135–145)
Total Bilirubin: 0.6 mg/dL (ref 0.3–1.2)
Total Protein: 5.9 g/dL — ABNORMAL LOW (ref 6.5–8.1)

## 2016-08-07 LAB — CBC WITH DIFFERENTIAL/PLATELET
Basophils Absolute: 0.1 10*3/uL (ref 0.0–0.1)
Basophils Relative: 1 %
EOS PCT: 2 %
Eosinophils Absolute: 0.1 10*3/uL (ref 0.0–0.7)
HEMATOCRIT: 35.6 % — AB (ref 36.0–46.0)
Hemoglobin: 10.8 g/dL — ABNORMAL LOW (ref 12.0–15.0)
LYMPHS ABS: 1.2 10*3/uL (ref 0.7–4.0)
LYMPHS PCT: 21 %
MCH: 30.4 pg (ref 26.0–34.0)
MCHC: 30.3 g/dL (ref 30.0–36.0)
MCV: 100.3 fL — AB (ref 78.0–100.0)
MONO ABS: 0.5 10*3/uL (ref 0.1–1.0)
Monocytes Relative: 8 %
NEUTROS ABS: 3.8 10*3/uL (ref 1.7–7.7)
Neutrophils Relative %: 68 %
Platelets: 213 10*3/uL (ref 150–400)
RBC: 3.55 MIL/uL — AB (ref 3.87–5.11)
RDW: 14 % (ref 11.5–15.5)
WBC: 5.6 10*3/uL (ref 4.0–10.5)

## 2016-08-07 LAB — I-STAT CHEM 8, ED
BUN: 28 mg/dL — ABNORMAL HIGH (ref 6–20)
CALCIUM ION: 1.16 mmol/L (ref 1.15–1.40)
Chloride: 98 mmol/L — ABNORMAL LOW (ref 101–111)
Creatinine, Ser: 1.5 mg/dL — ABNORMAL HIGH (ref 0.44–1.00)
Glucose, Bld: 223 mg/dL — ABNORMAL HIGH (ref 65–99)
HEMATOCRIT: 35 % — AB (ref 36.0–46.0)
HEMOGLOBIN: 11.9 g/dL — AB (ref 12.0–15.0)
Potassium: 4 mmol/L (ref 3.5–5.1)
SODIUM: 143 mmol/L (ref 135–145)
TCO2: 37 mmol/L (ref 0–100)

## 2016-08-07 LAB — I-STAT TROPONIN, ED: Troponin i, poc: 0.05 ng/mL (ref 0.00–0.08)

## 2016-08-07 LAB — PROTIME-INR
INR: 2.48
PROTHROMBIN TIME: 27.3 s — AB (ref 11.4–15.2)

## 2016-08-07 LAB — BRAIN NATRIURETIC PEPTIDE: B NATRIURETIC PEPTIDE 5: 580.7 pg/mL — AB (ref 0.0–100.0)

## 2016-08-07 MED ORDER — FUROSEMIDE 10 MG/ML IJ SOLN
20.0000 mg | Freq: Once | INTRAMUSCULAR | Status: AC
  Administered 2016-08-08: 20 mg via INTRAVENOUS
  Filled 2016-08-07: qty 2

## 2016-08-07 NOTE — ED Provider Notes (Signed)
[place] DEPT Provider Note   CSN: [id number] Arrival date & time: 08/07/16  2031     History   Chief Complaint Chief Complaint  Patient presents with  . Leg Swelling  . Congestive Heart Failure    HPI [name] is a 83 y.o. female who presents to the ED with a cc of SOB. She has a hx of end stage COPD chronically on 5LVia nasal cannula, history of CHF and recently treated for community-acquired pneumonia. The patient was visiting with her daughter when she complained of worsening shortness of breath. The patient has also had significant issues with peripheral edema and had leg weeping and just got out of her Unna boots yesterday. She states because of how heavy and swollen. Her legs are she's had significant difficulty walking. Patient states that she has had a 3-4 pound weight gain over the past 2 days. She feels more short of breath than her baseline breathing. She has been taking her medications as directed. She denies fevers, chills or chest pain.james   HPI  Past Medical History:  Diagnosis Date  . Angina   . Anxiety   . Arthritis    "all over"  . Bleeds easily ([place])   . Bradycardia 08/24/2011  . CAD (coronary artery disease)    Cath in 2006 showed an occluded RCA with left-to-right collaterals & otherwise noncritical CAD with EF=25-30% at that time. EF subsequently improved to normal by 2D ECHO. Cath Aug. 2011 showed unchanged anatomy. > medical therapy recommended  . Carotid artery disease (HCC)    Moderate, right greater than left which we following by duplex ultrasound. [place] 12/05/11 = RIght Bulb/Prox ICA: Moderate to severe amt of fibrous plaque elevating velocities w/in prox segment of ICA. Consistent w/a 50-69% diameter reduction. Left Bulb/Prox ICA: Moderate amt of fibrous plaque slightly elevating velocities w/in the prox segment of the ICA. Consistent w/a 0-49% diameter reduction.   . Carotid bruit   . CHF (congestive heart failure) ([place])    Hospitalized  08/22/11-08/26/11 with CHF secondary to diastolic dysfunction & hospitilized in 07/2012 with a similar problem. TTE 08/23/11 = normal EF.  Marland Kitchen Chronic lower back pain   . COPD (chronic obstructive pulmonary disease) (HCC)    GOLD 4 - PFT 06/08/10 FEV1 0.93 (62%), FEV1% 60, TLC 2.84 (69%0, DLCO 54%, +BD) On home O2.  Marland Kitchen COPD with emphysema ([place])   . Coronary atherosclerosis of unspecified type of vessel, native or graft   . Diastolic dysfunction    Grade II  . Edema   . Fall during current hospitalization    "was at [facility]; fell; transferred to [facility]" (01/13/2015)  . Family history of adverse reaction to anesthesia    "daughter gets PONV"  . GERD (gastroesophageal reflux disease)   . History of stress test 07/02/09   Mild perfusion defect seen in the Basal Inferior region(s). This is consistent with an infarct/scar.  Post-stress EF=56%. Global LV systolic function is normal.  EKG is negative for ischemia.  No significant iscemia detected. Low risk scan.  . Hyperlipidemia   . Hypertension   . Hypoxemic respiratory failure, chronic (HCC)    uses 2.5 liters of oxygen with sleep  . Lower extremity edema    ECHO 07/21/12 = EF 55-60%, performed for TIA. Responding to diurectics. Continue diuresis w/ a goal dry weight of <160lb. Venous Duplex 07/27/11 = Right lower extremity: no evidence of thrombus or thrombophlebitis. Essentially normal right lower extremity venous duplex Doppler evaluation.  . Mitral valve  regurgitation    Mild to moderate by ECHO 07/21/12  . Myocardial infarction    "she's had several" (01/13/2015)  . On home oxygen therapy    "?L; prn" (01/13/2015)  . PAF (paroxysmal atrial fibrillation) (HCC)    In the past, on coumadin  . Pneumonia ~ 2013  . PONV (postoperative nausea and vomiting)   . RBBB (right bundle branch block)    Chronic  . Secondary pulmonary hypertension   . Shortness of breath   . Sleep apnea   . Sleep-related hypoventilation   . Stroke [facility])    "/CT scan; ~ 2014;  didn't even know she'd had it"  (01/13/2015)  . Type II diabetes mellitus (HCC)    goal A1C is 8, to avoid hypoglycemia    Patient Active Problem List   Diagnosis Date Noted  . CAP (community acquired pneumonia) 07/29/2016  . Microalbuminuria 06/30/2016  . CKD stage 3 secondary to diabetes ([place]) 05/02/2016  . Atherosclerotic peripheral vascular disease ([employer]) 11/04/2015  . Intertrochanteric fracture of left hip ([place])   . Neck pain   . Postoperative anemia due to acute blood loss   . Hip fracture requiring operative repair ([place]) 01/15/2015  . Preop cardiovascular exam 01/14/2015  . Hip fracture ([place]) 01/13/2015  . Osteopenia 01/05/2015  . Primary osteoarthritis involving multiple joints 06/12/2014  . CHF (congestive heart failure) ([place]) 12/28/2013  . Atypical chest pain 12/18/2013  . Upper airway cough syndrome 05/20/2013  . Pain in joint, ankle and foot 12/06/2012  . Olecranon bursitis of left elbow 08/26/2012  . Scalp lesion 08/26/2012  . Counseling regarding advanced directives 08/26/2012  . DM type 2 causing CKD stage 3 ([place]) 07/30/2012  . Sacral fracture, closed ([place]) 07/22/2012  . Ataxia 07/20/2012  . Weakness of both legs 07/20/2012  . Falls frequently 07/20/2012  . RBBB 08/25/2011  . CAD, remote RCA PCI, subsequently noted to be occluded, last cath 8/11 08/25/2011  . PVD (peripheral vascular disease), moderate carotid and LSCA disease 08/25/2011  . HTN (hypertension) 08/22/2011  . AK (actinic keratosis) 07/17/2011  . Shoulder pain 04/03/2011  . Gait instability 10/11/2010  . PAF, recurrance this admission, on chronic Amio/ Coumadin 07/06/2010  . COPD with emphysema ([place]) 06/16/2010  . Chronic respiratory failure with hypoxia ([place]) 06/07/2010  . Hyperlipidemia LDL goal <70 02/11/2010    Past Surgical History:  Procedure Laterality Date  . CARDIAC CATHETERIZATION  2006 & 2011   Cath in 2006 showed an occluded RCA with left-to-right collaterals & otherwise noncritical  CAD with EF=25-30% at that time. EF subsequently improved to normal by 2D ECHO. Cath Aug. 2011 showed unchanged anatomy. > medical therapy recommended.  . Carotid Duplex  12/05/11   Moderate, right greater than left which we following by duplex ultrasound. [place] 12/05/11 = RIght Bulb/Prox ICA: Moderate to severe amt of fibrous plaque elevating velocities w/in prox segment of ICA. Consistent w/a 50-69% diameter reduction. Left Bulb/Prox ICA: Moderate amt of fibrous plaque slightly elevating velocities w/in the prox segment of the ICA. Consistent w/a 0-49% diameter reduction.   Marland Kitchen CATARACT EXTRACTION W/ INTRAOCULAR LENS  IMPLANT, BILATERAL Bilateral   . CORONARY ANGIOPLASTY    . CORONARY ANGIOPLASTY WITH STENT PLACEMENT     x2  . FEMUR IM NAIL Left 01/15/2015   Procedure: INTRAMEDULLARY (IM) NAIL FEMORAL LEFT ;  Surgeon: Netta Cedars, MD;  Location: [place];  Service: Orthopedics;  Laterality: Left;  . SHOULDER OPEN ROTATOR CUFF REPAIR Right 07/2005  . TONSILLECTOMY    .  TOTAL ABDOMINAL HYSTERECTOMY  ~ 1967  . TRANSVAGINAL TAPE (TVT) REMOVAL    . Venous Duplex  07/27/11   Venous Duplex 07/27/11 = Right lower extremity: no evidence of thrombus or thrombophlebitis. Essentially normal right lower extremity venous duplex Doppler evaluation.    OB History    No data available       Home Medications    Prior to Admission medications   Medication Sig Start Date End Date Taking? Authorizing Provider  ACCU-CHEK AVIVA PLUS test strip Test tid. Dx E11.9 11/27/15   Chipper Herb, MD  acetaminophen (TYLENOL) 500 MG tablet Take 500 mg by mouth every 4 (four) hours as needed for mild pain.     Historical Provider, MD  ALPRAZolam (XANAX) 0.25 MG tablet TAKE 1/2 TO 1 TABLET 2 TIMES A DAY AS NEEDED FOR ANXIETY 07/03/15   Sharion Balloon, FNP  amiodarone (PACERONE) 200 MG tablet TAKE (1/2) TABLET DAILY. 07/19/16   Chesley Mires, MD  aspirin EC 81 MG tablet Take 81 mg by mouth daily with breakfast.     Historical Provider,  MD  atorvastatin (LIPITOR) 20 MG tablet TAKE 1 TABLET DAILY IN THE EVENING Patient taking differently: TAKE 20 mg by mouth at bedtime 05/04/16   Chipper Herb, MD  Azelastine HCl 0.15 % SOLN Place 1 spray into both nostrils 2 (two) times daily as needed (congestion).    Historical Provider, MD  azithromycin (ZITHROMAX) 250 MG tablet  07/29/16   Historical Provider, MD  baclofen (LIORESAL) 10 MG tablet TAKE 1/2 TABLET EVERY 8 HOURS AS NEEDED FOR MUSCLE SPASM 07/04/16   Timmothy Euler, MD  budesonide-formoterol [facility]) 160-4.5 MCG/ACT inhaler Inhale 1 puff into the lungs 2 (two) times daily. Patient taking differently: Inhale 1 puff into the lungs 2 (two) times daily as needed (wheezing).  01/24/14   Chipper Herb, MD  cholecalciferol (VITAMIN D) 1000 units tablet Take 1,000 Units by mouth daily.    Historical Provider, MD  docusate sodium (COLACE) 100 MG capsule Take 100 mg by mouth daily as needed.     Historical Provider, MD  ferrous sulfate 325 (65 FE) MG tablet TAKE 1 TABLET ONCE DAILY WITH BREAKFAST 02/22/16   Chipper Herb, MD  fluticasone [facility]) 50 MCG/ACT nasal spray Place 1 spray into both nostrils 2 (two) times daily. Patient taking differently: Place 1 spray into both nostrils as needed.  12/24/13   Chesley Mires, MD  glucosamine-chondroitin 500-400 MG tablet Take 1 tablet by mouth 2 (two) times daily.    Historical Provider, MD  guaifenesin (HUMIBID E) 400 MG TABS tablet Take 600 mg by mouth 2 (two) times daily.    Historical Provider, MD  HUMALOG KWIKPEN 100 UNIT/ML KiwkPen INJECT UP TO 8 UNITS BEFORE MEALS AS DIRECTED BY SLIDING SCALE. MAX 24 UNITS PER DAY 04/07/15   Fransisca Kaufmann Dettinger, MD  hydrocortisone (ANUSOL-HC) 25 MG suppository Place 1 suppository (25 mg total) rectally 2 (two) times daily as needed for hemorrhoids or itching. 07/30/15   Fransisca Kaufmann Dettinger, MD  Insulin Pen Needle (PEN NEEDLES) 31G X 6 MM MISC Use to administer insulin up to qid 05/25/15   Chipper Herb, MD    Ipratropium-Albuterol (COMBIVENT) 20-100 MCG/ACT AERS respimat Inhale 1 puff into the lungs every 6 (six) hours as needed for wheezing or shortness of breath. 01/24/14   Chipper Herb, MD  LEVEMIR FLEXTOUCH 100 UNIT/ML Pen INJECT UP TO 20 UNITS SQ EVERY MORNING AS DIRECTED Patient taking differently:  INJECT 8 units once a day each morning 05/04/16   Chipper Herb, MD  metoprolol succinate (TOPROL-XL) 25 MG 24 hr tablet TAKE (1/2) TABLET DAILY. Patient taking differently: Take 25mg  by mouth in the morning 05/04/16   Chipper Herb, MD  Multiple Vitamins-Minerals (CENTRUM ADULTS) TABS Take 1 tablet by mouth daily with breakfast.    Historical Provider, MD  Multiple Vitamins-Minerals (PRESERVISION AREDS) CAPS Take 1 capsule by mouth daily with breakfast.    Historical Provider, MD  NIFEdipine (PROCARDIA-XL/ADALAT-CC/NIFEDICAL-XL) 30 MG 24 hr tablet TAKE 1 TABLET ONCE DAILY 07/19/16   Chesley Mires, MD  nitroGLYCERIN (NITROSTAT) 0.4 MG SL tablet Place 1 tablet (0.4 mg total) under the tongue every 5 (five) minutes as needed for chest pain. 05/09/14   Mary-Margaret Hassell Done, FNP  ondansetron (ZOFRAN ODT) 4 MG disintegrating tablet Take 1 tablet (4 mg total) by mouth every 8 (eight) hours as needed for nausea or vomiting. 05/08/16   Fredia Sorrow, MD  OXYGEN Inhale 2.5 L into the lungs daily.    Historical Provider, MD  pantoprazole (PROTONIX) 40 MG tablet Take 1 tablet (40 mg total) by mouth daily. Patient taking differently: Take 40 mg by mouth daily with breakfast.  08/12/15   Chipper Herb, MD  polyethylene glycol [facility]) packet Take 17 g by mouth 2 (two) times daily as needed for moderate constipation. 05/17/16   Virgel Manifold, MD  polyethylene glycol powder (GLYCOLAX/MIRALAX) powder Take 17 g by mouth daily as needed. Patient taking differently: Take 17 g by mouth daily as needed for mild constipation.  07/30/15   Fransisca Kaufmann Dettinger, MD  potassium chloride SA (K-DUR,KLOR-CON) 20 MEQ tablet  TAKE 1 TABLET DAILY 07/20/16   Chipper Herb, MD  PRODIGY TWIST TOP LANCETS 28G MISC CHECK BLOOD SUGAR UP TO 4 TIMES A DAY 05/23/16   Chipper Herb, MD  simethicone (MYLICON) 80 MG chewable tablet Chew 80 mg by mouth at bedtime as needed for flatulence.    Historical Provider, MD  torsemide (DEMADEX) 20 MG tablet TAKE  (1)  TABLET TWICE A DAY. 06/17/16   Timmothy Euler, MD  traMADol (ULTRAM) 50 MG tablet Take 1 tablet (50 mg total) by mouth every 6 (six) hours as needed for severe pain. 12/26/15   Julianne Rice, MD  vitamin C (ASCORBIC ACID) 500 MG tablet Take 500 mg by mouth at bedtime.     Historical Provider, MD  warfarin (COUMADIN) 4 MG tablet Take 1.5 tablets (6 mg total) by mouth daily. 07/25/16   Cherre Robins, PharmD    Family History Family History  Problem Relation Age of Onset  . Cancer Mother     Skin  . Heart disease Mother   . Emphysema Father   . Heart disease Father   . Congestive Heart Failure Father   . Heart disease Brother   . Heart disease Brother   . Early death Sister     Social History Social History  Substance Use Topics  . Smoking status: Former Smoker    Packs/day: 1.50    Years: 15.00    Types: Cigarettes  . Smokeless tobacco: Never Used     Comment: Smoked 1.5 packs per day for 15 years, quit in 1995.  Marland Kitchen Alcohol use 3.6 oz/week    6 Glasses of wine per week     Comment: 01/13/2015 "couple glasses of wine maybe 3 days/wk"     Allergies   Atorvastatin; Codeine; Morphine; Other; Rosuvastatin; Sulfa antibiotics; and  Iohexol   Review of Systems Review of Systems  Ten systems reviewed and are negative for acute change, except as noted in the HPI.   Physical Exam Updated Vital Signs BP 114/87   Pulse (!) 57   Temp 97.7 F (36.5 C) (Oral)   Resp 17   Ht 5' (1.524 m)   Wt 53.1 kg   SpO2 100%   BMI 22.85 kg/m   Physical Exam  Constitutional: She is oriented to person, place, and time. She appears well-developed and well-nourished. No  distress.  HENT:  Head: Normocephalic and atraumatic.  Eyes: Conjunctivae and EOM are normal. Pupils are equal, round, and reactive to light. No scleral icterus.  Neck: Normal range of motion. JVD present.  Cardiovascular: Normal rate, regular rhythm and normal heart sounds.  Exam reveals no gallop and no friction rub.   No murmur heard. 3+ pitting edema up to the knees bilaterally  Pulmonary/Chest:  Decreased breath sounds throughout, crackles in the bases of the lungs  Abdominal: Soft. Bowel sounds are normal. She exhibits no distension and no mass. There is no tenderness. There is no guarding.  Neurological: She is alert and oriented to person, place, and time.  Skin: Skin is warm and dry. She is not diaphoretic.  Nursing note and vitals reviewed.    ED Treatments / Results  Labs (all labs ordered are listed, but only abnormal results are displayed) Labs Reviewed  COMPREHENSIVE METABOLIC PANEL - Abnormal; Notable for the following:       Result Value   Chloride 98 (*)    CO2 36 (*)    Glucose, Bld 219 (*)    BUN 25 (*)    Creatinine, Ser 1.27 (*)    Total Protein 5.9 (*)    Albumin 3.1 (*)    GFR calc non Af Amer 37 (*)    GFR calc Af Amer 43 (*)    All other components within normal limits  CBC WITH DIFFERENTIAL/PLATELET - Abnormal; Notable for the following:    RBC 3.55 (*)    Hemoglobin 10.8 (*)    HCT 35.6 (*)    MCV 100.3 (*)    All other components within normal limits  BRAIN NATRIURETIC PEPTIDE - Abnormal; Notable for the following:    B Natriuretic Peptide 580.7 (*)    All other components within normal limits  I-STAT CHEM 8, ED - Abnormal; Notable for the following:    Chloride 98 (*)    BUN 28 (*)    Creatinine, Ser 1.50 (*)    Glucose, Bld 223 (*)    Hemoglobin 11.9 (*)    HCT 35.0 (*)    All other components within normal limits  PROTIME-INR  I-STAT TROPOININ, ED    EKG  EKG Interpretation None       Radiology Dg Chest 2 View  Result  Date: 08/07/2016 CLINICAL DATA:  Dyspnea. Recent history of pneumonia with antibiotic administration. Pitting edema in lower extremities. EXAM: CHEST  2 VIEW COMPARISON:  None. FINDINGS: Cardiomegaly with aortic atherosclerosis. The thoracic aorta is slightly uncoiled in appearance without aneurysm. Mild interstitial edema is noted with trace posterior pleural effusions. Trace fluid in the right major fissure. No pulmonary consolidation. No pneumothorax. Degenerative changes along the dorsal spine. IMPRESSION: Stable cardiomegaly with aortic atherosclerosis. Mild interstitial pulmonary edema. Trace posterior pleural effusions. Electronically Signed   By: Ashley Royalty M.D.   On: 08/07/2016 22:40    Procedures Procedures (including critical care time)  Medications Ordered  in ED Medications - No data to display   Initial Impression / Assessment and Plan / ED Course  I have reviewed the triage vital signs and the nursing notes.  Pertinent labs & imaging results that were available during my care of the patient were reviewed by me and considered in my medical decision making (see chart for details).    Labs and images reviewed  patient will be admitted for chf exacerbation.  Patient has been stable throughout her ed stay.  Final Clinical Impressions(s) / ED Diagnoses   Final diagnoses:  Acute on chronic congestive heart failure, unspecified congestive heart failure type [facility])  Impaired ambulation    New Prescriptions New Prescriptions   No medications on file     Margarita Mail, PA-C 08/09/16 1740    Tanna Furry, MD 08/14/16 0502

## 2016-08-07 NOTE — ED Provider Notes (Signed)
Patient seen and evaluated. Crackles at both bases. Saturating well between 4 and 5 L. Dyspneic with conversation. Bilateral lower extremity edema. Per report patient had difficulty with ambulation. Chest x-ray shows pulmonary edema. Agree with admission for diuresis. May need significant social work assistance with discharge as daughter is having difficulty caring for patient at home secondary to her ongoing health needs as well.   Tanna Furry, MD 08/07/16 2322

## 2016-08-07 NOTE — ED Notes (Signed)
Pt reports fluid leaking from her legs and inability to walk going on a week now. Pt called daughter for help and daughter brought her to her house. Pt also reports SOB due to fluid overload.

## 2016-08-07 NOTE — ED Triage Notes (Signed)
[facility] advised they were called out for SOB due to anxiety. EMS advised the pt was satting at 89% on 5L by cannula. EMS got pt calmed down and on 4L she was satting at 100%. EMS reports recent hx of pneumonia with antibiotic administration. EMS started 22g IV in L AC. EMS reports decreased lungs sounds with crackles in the bases and pitting edema in lower extremeties. EMS 12 lead shows right BBB with a 1st degree block.   Pt reports fluid leaking from her legs and inability to walk going on a week now. Pt called daughter for help and daughter brought her to her house. Pt also reports SOB due to fluid overload.

## 2016-08-07 NOTE — ED Notes (Signed)
Patient transported to X-ray 

## 2016-08-07 NOTE — ED Notes (Signed)
Pt's daughter is Melina Schools, # is [phone].

## 2016-08-08 ENCOUNTER — Observation Stay (HOSPITAL_BASED_OUTPATIENT_CLINIC_OR_DEPARTMENT_OTHER): Payer: Medicare Other

## 2016-08-08 DIAGNOSIS — I4510 Unspecified right bundle-branch block: Secondary | ICD-10-CM | POA: Diagnosis present

## 2016-08-08 DIAGNOSIS — K219 Gastro-esophageal reflux disease without esophagitis: Secondary | ICD-10-CM | POA: Diagnosis present

## 2016-08-08 DIAGNOSIS — I2510 Atherosclerotic heart disease of native coronary artery without angina pectoris: Secondary | ICD-10-CM | POA: Diagnosis not present

## 2016-08-08 DIAGNOSIS — I5043 Acute on chronic combined systolic (congestive) and diastolic (congestive) heart failure: Secondary | ICD-10-CM | POA: Diagnosis not present

## 2016-08-08 DIAGNOSIS — I509 Heart failure, unspecified: Secondary | ICD-10-CM

## 2016-08-08 DIAGNOSIS — I340 Nonrheumatic mitral (valve) insufficiency: Secondary | ICD-10-CM | POA: Diagnosis present

## 2016-08-08 DIAGNOSIS — I252 Old myocardial infarction: Secondary | ICD-10-CM | POA: Diagnosis not present

## 2016-08-08 DIAGNOSIS — E1122 Type 2 diabetes mellitus with diabetic chronic kidney disease: Secondary | ICD-10-CM | POA: Diagnosis not present

## 2016-08-08 DIAGNOSIS — Z8701 Personal history of pneumonia (recurrent): Secondary | ICD-10-CM | POA: Diagnosis not present

## 2016-08-08 DIAGNOSIS — J439 Emphysema, unspecified: Secondary | ICD-10-CM | POA: Diagnosis not present

## 2016-08-08 DIAGNOSIS — Z9981 Dependence on supplemental oxygen: Secondary | ICD-10-CM | POA: Diagnosis not present

## 2016-08-08 DIAGNOSIS — R001 Bradycardia, unspecified: Secondary | ICD-10-CM | POA: Diagnosis present

## 2016-08-08 DIAGNOSIS — I5023 Acute on chronic systolic (congestive) heart failure: Secondary | ICD-10-CM | POA: Diagnosis not present

## 2016-08-08 DIAGNOSIS — Z8673 Personal history of transient ischemic attack (TIA), and cerebral infarction without residual deficits: Secondary | ICD-10-CM | POA: Diagnosis not present

## 2016-08-08 DIAGNOSIS — I2729 Other secondary pulmonary hypertension: Secondary | ICD-10-CM | POA: Diagnosis not present

## 2016-08-08 DIAGNOSIS — E785 Hyperlipidemia, unspecified: Secondary | ICD-10-CM | POA: Diagnosis present

## 2016-08-08 DIAGNOSIS — N183 Chronic kidney disease, stage 3 (moderate): Secondary | ICD-10-CM | POA: Diagnosis present

## 2016-08-08 DIAGNOSIS — I480 Paroxysmal atrial fibrillation: Secondary | ICD-10-CM | POA: Diagnosis not present

## 2016-08-08 DIAGNOSIS — D649 Anemia, unspecified: Secondary | ICD-10-CM | POA: Diagnosis present

## 2016-08-08 DIAGNOSIS — Z794 Long term (current) use of insulin: Secondary | ICD-10-CM | POA: Diagnosis not present

## 2016-08-08 DIAGNOSIS — G4730 Sleep apnea, unspecified: Secondary | ICD-10-CM | POA: Diagnosis present

## 2016-08-08 DIAGNOSIS — H9190 Unspecified hearing loss, unspecified ear: Secondary | ICD-10-CM | POA: Diagnosis present

## 2016-08-08 DIAGNOSIS — Z955 Presence of coronary angioplasty implant and graft: Secondary | ICD-10-CM | POA: Diagnosis not present

## 2016-08-08 DIAGNOSIS — I130 Hypertensive heart and chronic kidney disease with heart failure and stage 1 through stage 4 chronic kidney disease, or unspecified chronic kidney disease: Secondary | ICD-10-CM | POA: Diagnosis not present

## 2016-08-08 DIAGNOSIS — Z7901 Long term (current) use of anticoagulants: Secondary | ICD-10-CM | POA: Diagnosis not present

## 2016-08-08 DIAGNOSIS — J9611 Chronic respiratory failure with hypoxia: Secondary | ICD-10-CM | POA: Diagnosis not present

## 2016-08-08 LAB — GLUCOSE, CAPILLARY
Glucose-Capillary: 132 mg/dL — ABNORMAL HIGH (ref 65–99)
Glucose-Capillary: 191 mg/dL — ABNORMAL HIGH (ref 65–99)
Glucose-Capillary: 194 mg/dL — ABNORMAL HIGH (ref 65–99)
Glucose-Capillary: 143 mg/dL — ABNORMAL HIGH (ref 65–99)

## 2016-08-08 LAB — ECHOCARDIOGRAM COMPLETE
HEIGHTINCHES: 60 in
WEIGHTICAEL: 1883.2 [oz_av]

## 2016-08-08 LAB — PROTIME-INR
INR: 2.6
Prothrombin Time: 28.4 seconds — ABNORMAL HIGH (ref 11.4–15.2)

## 2016-08-08 LAB — TROPONIN I
Troponin I: 0.06 ng/mL (ref <0.03)
Troponin I: 0.05 ng/mL (ref <0.03)

## 2016-08-08 MED ORDER — INSULIN ASPART 100 UNIT/ML ~~LOC~~ SOLN
0.0000 [IU] | Freq: Three times a day (TID) | SUBCUTANEOUS | Status: DC
  Administered 2016-08-08: 1 [IU] via SUBCUTANEOUS
  Administered 2016-08-08 (×2): 2 [IU] via SUBCUTANEOUS
  Administered 2016-08-09: 1 [IU] via SUBCUTANEOUS
  Administered 2016-08-09 – 2016-08-10 (×2): 2 [IU] via SUBCUTANEOUS
  Administered 2016-08-10: 1 [IU] via SUBCUTANEOUS
  Administered 2016-08-11: 2 [IU] via SUBCUTANEOUS

## 2016-08-08 MED ORDER — NITROGLYCERIN 0.4 MG SL SUBL: 0.4000 mg | SUBLINGUAL_TABLET | SUBLINGUAL | Status: DC | PRN

## 2016-08-08 MED ORDER — AMIODARONE HCL 100 MG PO TABS
100.0000 mg | ORAL_TABLET | Freq: Every day | ORAL | Status: DC
  Administered 2016-08-08 – 2016-08-12 (×5): 100 mg via ORAL
  Filled 2016-08-08 (×5): qty 1

## 2016-08-08 MED ORDER — SIMETHICONE 80 MG PO CHEW: 80.0000 mg | CHEWABLE_TABLET | Freq: Every evening | ORAL | Status: DC | PRN

## 2016-08-08 MED ORDER — NIFEDIPINE ER OSMOTIC RELEASE 30 MG PO TB24
30.0000 mg | ORAL_TABLET | Freq: Every day | ORAL | Status: DC
  Administered 2016-08-08 – 2016-08-12 (×5): 30 mg via ORAL
  Filled 2016-08-08 (×5): qty 1

## 2016-08-08 MED ORDER — POLYETHYLENE GLYCOL 3350 17 GM/SCOOP PO POWD: 17.0000 g | Freq: Every day | ORAL | Status: DC | PRN

## 2016-08-08 MED ORDER — HYDROCORTISONE ACETATE 25 MG RE SUPP
25.0000 mg | Freq: Two times a day (BID) | RECTAL | Status: DC | PRN
  Filled 2016-08-08: qty 1

## 2016-08-08 MED ORDER — POTASSIUM CHLORIDE CRYS ER 20 MEQ PO TBCR
20.0000 meq | EXTENDED_RELEASE_TABLET | Freq: Every day | ORAL | Status: DC
Start: 2016-08-08 — End: 2016-08-12
  Administered 2016-08-08 – 2016-08-12 (×5): 20 meq via ORAL
  Filled 2016-08-08 (×5): qty 1

## 2016-08-08 MED ORDER — SODIUM CHLORIDE 0.9% FLUSH
3.0000 mL | Freq: Two times a day (BID) | INTRAVENOUS | Status: DC
  Administered 2016-08-08 – 2016-08-12 (×10): 3 mL via INTRAVENOUS

## 2016-08-08 MED ORDER — POLYETHYLENE GLYCOL 3350 17 G PO PACK: 17.0000 g | PACK | Freq: Two times a day (BID) | ORAL | Status: DC | PRN

## 2016-08-08 MED ORDER — WARFARIN SODIUM 3 MG PO TABS
6.0000 mg | ORAL_TABLET | Freq: Every day | ORAL | Status: DC
  Administered 2016-08-08 – 2016-08-11 (×4): 6 mg via ORAL
  Filled 2016-08-08 (×4): qty 2

## 2016-08-08 MED ORDER — FUROSEMIDE 10 MG/ML IJ SOLN
40.0000 mg | Freq: Two times a day (BID) | INTRAMUSCULAR | Status: DC
  Administered 2016-08-08 – 2016-08-09 (×3): 40 mg via INTRAVENOUS
  Filled 2016-08-08 (×3): qty 4

## 2016-08-08 MED ORDER — DOCUSATE SODIUM 100 MG PO CAPS: 100.0000 mg | ORAL_CAPSULE | Freq: Every day | ORAL | Status: DC | PRN

## 2016-08-08 MED ORDER — MOMETASONE FURO-FORMOTEROL FUM 200-5 MCG/ACT IN AERO
2.0000 | INHALATION_SPRAY | Freq: Two times a day (BID) | RESPIRATORY_TRACT | Status: DC
  Administered 2016-08-08 – 2016-08-12 (×9): 2 via RESPIRATORY_TRACT
  Filled 2016-08-08: qty 8.8

## 2016-08-08 MED ORDER — INSULIN DETEMIR 100 UNIT/ML ~~LOC~~ SOLN
7.0000 [IU] | Freq: Every day | SUBCUTANEOUS | Status: DC
  Administered 2016-08-08 – 2016-08-12 (×5): 7 [IU] via SUBCUTANEOUS
  Filled 2016-08-08 (×5): qty 0.07

## 2016-08-08 MED ORDER — FLUTICASONE PROPIONATE 50 MCG/ACT NA SUSP
1.0000 | NASAL | Status: DC | PRN
  Administered 2016-08-08: 1 via NASAL
  Filled 2016-08-08: qty 16

## 2016-08-08 MED ORDER — WARFARIN - PHARMACIST DOSING INPATIENT
Freq: Every day | Status: DC
  Administered 2016-08-08 – 2016-08-11 (×4)

## 2016-08-08 MED ORDER — IPRATROPIUM-ALBUTEROL 0.5-2.5 (3) MG/3ML IN SOLN: 3.0000 mL | Freq: Four times a day (QID) | RESPIRATORY_TRACT | Status: DC | PRN

## 2016-08-08 MED ORDER — GUAIFENESIN ER 600 MG PO TB12
600.0000 mg | ORAL_TABLET | Freq: Two times a day (BID) | ORAL | Status: DC
  Administered 2016-08-08 – 2016-08-12 (×10): 600 mg via ORAL
  Filled 2016-08-08 (×10): qty 1

## 2016-08-08 MED ORDER — PANTOPRAZOLE SODIUM 40 MG PO TBEC
40.0000 mg | DELAYED_RELEASE_TABLET | Freq: Every day | ORAL | Status: DC
  Administered 2016-08-08 – 2016-08-12 (×5): 40 mg via ORAL
  Filled 2016-08-08 (×5): qty 1

## 2016-08-08 MED ORDER — AZELASTINE HCL 0.1 % NA SOLN
1.0000 | Freq: Two times a day (BID) | NASAL | Status: DC | PRN
  Filled 2016-08-08: qty 30

## 2016-08-08 MED ORDER — METOPROLOL TARTRATE 12.5 MG HALF TABLET
12.5000 mg | ORAL_TABLET | Freq: Two times a day (BID) | ORAL | Status: DC
  Administered 2016-08-08: 12.5 mg via ORAL
  Filled 2016-08-08: qty 1

## 2016-08-08 MED ORDER — ACETAMINOPHEN 500 MG PO TABS
500.0000 mg | ORAL_TABLET | ORAL | Status: DC | PRN
  Administered 2016-08-11: 500 mg via ORAL
  Filled 2016-08-08: qty 1

## 2016-08-08 MED ORDER — ALPRAZOLAM 0.25 MG PO TABS
0.2500 mg | ORAL_TABLET | Freq: Three times a day (TID) | ORAL | Status: DC | PRN
  Administered 2016-08-09: 0.25 mg via ORAL
  Filled 2016-08-08 (×2): qty 1

## 2016-08-08 MED ORDER — LISINOPRIL 2.5 MG PO TABS
2.5000 mg | ORAL_TABLET | Freq: Every day | ORAL | Status: DC
  Administered 2016-08-08: 2.5 mg via ORAL
  Filled 2016-08-08: qty 1

## 2016-08-08 NOTE — Progress Notes (Signed)
ANTICOAGULATION CONSULT NOTE - Follow Up Consult  Pharmacy Consult for Coumadin Indication: atrial fibrillation  Allergies  Allergen Reactions  . Atorvastatin Other (See Comments)     muscle aches  . Codeine Nausea And Vomiting  . Morphine Nausea And Vomiting  . Other Other (See Comments)    OPIATES cause nausea and vomiting  . Rosuvastatin Other (See Comments)    muscle aches at high doses, held as of 12/2010 due to aches  . Sulfa Antibiotics Nausea And Vomiting  . Iohexol Other (See Comments)     Desc: unknown reaction; allergic to iodine and contrast     Patient Measurements: Height: 5' (152.4 cm) Weight: 117 lb 11.2 oz (53.4 kg) (Scale B) IBW/kg (Calculated) : 45.5  Vital Signs: Temp: 97.6 F (36.4 C) (02/12 0805) Temp Source: Oral (02/12 0805) BP: 127/71 (02/12 1112) Pulse Rate: 52 (02/12 1112)  Labs:  Recent Labs  08/07/16 2138 08/07/16 2158 08/07/16 2246 08/08/16 0358 08/08/16 1115  HGB 10.8* 11.9*  --   --   --   HCT 35.6* 35.0*  --   --   --   PLT 213  --   --   --   --   LABPROT  --   --  27.3* 28.4*  --   INR  --   --  2.48 2.60  --   CREATININE 1.27* 1.50*  --   --   --   TROPONINI  --   --   --  0.06* 0.05*    Estimated Creatinine Clearance: 19.3 mL/min (by C-G formula based on SCr of 1.5 mg/dL (H)).   Assessment:   CC/HPI: pulmonary edema. week history of progressively worse dyspnea and lower extremity edema despite treatment with oral furosemide and torsemide   Anticoag: coumadin PTA for afib. Admission INR 2.48 (therapeutic). CBC stable.INR 2.48>2.6 today. - PTA dose 6mg  daily  Goal of Therapy:  INR 2-3 Monitor platelets by anticoagulation protocol: Yes   Plan:  Resume Coumadin 6mg  po x daily Daily INR   [name] S. [name], PharmD, BCPS Clinical Staff Pharmacist Pager [phone]  [name] 08/08/2016,2:26 PM

## 2016-08-08 NOTE — Progress Notes (Addendum)
ANTICOAGULATION CONSULT NOTE - Initial Consult  Pharmacy Consult for Coumadin Indication: atrial fibrillation  Allergies  Allergen Reactions  . Atorvastatin Other (See Comments)     muscle aches  . Codeine Nausea And Vomiting  . Morphine Nausea And Vomiting  . Other Other (See Comments)    OPIATES cause nausea and vomiting  . Rosuvastatin Other (See Comments)    muscle aches at high doses, held as of 12/2010 due to aches  . Sulfa Antibiotics Nausea And Vomiting  . Iohexol Other (See Comments)     Desc: unknown reaction; allergic to iodine and contrast     Patient Measurements: Height: 5' (152.4 cm) Weight: 117 lb 11.2 oz (53.4 kg) (Scale B) IBW/kg (Calculated) : 45.5  Vital Signs: Temp: 97.3 F (36.3 C) (02/12 0146) Temp Source: Oral (02/12 0146) BP: 161/75 (02/12 0146) Pulse Rate: 56 (02/12 0146)  Labs:  Recent Labs  08/07/16 2138 08/07/16 2158 08/07/16 2246  HGB 10.8* 11.9*  --   HCT 35.6* 35.0*  --   PLT 213  --   --   LABPROT  --   --  27.3*  INR  --   --  2.48  CREATININE 1.27* 1.50*  --     Estimated Creatinine Clearance: 19.3 mL/min (by C-G formula based on SCr of 1.5 mg/dL (H)).   Medical History: Past Medical History:  Diagnosis Date  . Angina   . Anxiety   . Arthritis    "all over"  . Bleeds easily ([place])   . Bradycardia 08/24/2011  . CAD (coronary artery disease)    Cath in 2006 showed an occluded RCA with left-to-right collaterals & otherwise noncritical CAD with EF=25-30% at that time. EF subsequently improved to normal by 2D ECHO. Cath Aug. 2011 showed unchanged anatomy. > medical therapy recommended  . Carotid artery disease (HCC)    Moderate, right greater than left which we following by duplex ultrasound. [place] 12/05/11 = RIght Bulb/Prox ICA: Moderate to severe amt of fibrous plaque elevating velocities w/in prox segment of ICA. Consistent w/a 50-69% diameter reduction. Left Bulb/Prox ICA: Moderate amt of fibrous plaque slightly elevating  velocities w/in the prox segment of the ICA. Consistent w/a 0-49% diameter reduction.   . Carotid bruit   . CHF (congestive heart failure) ([place])    Hospitalized 08/22/11-08/26/11 with CHF secondary to diastolic dysfunction & hospitilized in 07/2012 with a similar problem. TTE 08/23/11 = normal EF.  Marland Kitchen Chronic lower back pain   . COPD (chronic obstructive pulmonary disease) (HCC)    GOLD 4 - PFT 06/08/10 FEV1 0.93 (62%), FEV1% 60, TLC 2.84 (69%0, DLCO 54%, +BD) On home O2.  Marland Kitchen COPD with emphysema ([place])   . Coronary atherosclerosis of unspecified type of vessel, native or graft   . Diastolic dysfunction    Grade II  . Edema   . Fall during current hospitalization    "was at [facility]; fell; transferred to [facility]" (01/13/2015)  . Family history of adverse reaction to anesthesia    "daughter gets PONV"  . GERD (gastroesophageal reflux disease)   . History of stress test 07/02/09   Mild perfusion defect seen in the Basal Inferior region(s). This is consistent with an infarct/scar.  Post-stress EF=56%. Global LV systolic function is normal.  EKG is negative for ischemia.  No significant iscemia detected. Low risk scan.  . Hyperlipidemia   . Hypertension   . Hypoxemic respiratory failure, chronic (HCC)    uses 2.5 liters of oxygen with sleep  . Lower  extremity edema    ECHO 07/21/12 = EF 55-60%, performed for TIA. Responding to diurectics. Continue diuresis w/ a goal dry weight of <160lb. Venous Duplex 07/27/11 = Right lower extremity: no evidence of thrombus or thrombophlebitis. Essentially normal right lower extremity venous duplex Doppler evaluation.  . Mitral valve regurgitation    Mild to moderate by ECHO 07/21/12  . Myocardial infarction    "she's had several" (01/13/2015)  . On home oxygen therapy    "?L; prn" (01/13/2015)  . PAF (paroxysmal atrial fibrillation) (HCC)    In the past, on coumadin  . Pneumonia ~ 2013  . PONV (postoperative nausea and vomiting)   . RBBB (right bundle branch block)     Chronic  . Secondary pulmonary hypertension   . Shortness of breath   . Sleep apnea   . Sleep-related hypoventilation   . Stroke [facility])    "/CT scan; ~ 2014; didn't even know she'd had it"  (01/13/2015)  . Type II diabetes mellitus (HCC)    goal A1C is 8, to avoid hypoglycemia    Medications:  Awaiting electronic med rec  Assessment: 83 y.o. F presents with pulmonary edema. Pt on coumadin PTA for afib. Admission INR 2.48 (therapeutic). CBC stable.  Goal of Therapy:  INR 2-3 Monitor platelets by anticoagulation protocol: Yes   Plan:  Daily INR  Sherlon Handing, PharmD, BCPS Clinical pharmacist, pager [phone] 08/08/2016,3:14 AM

## 2016-08-08 NOTE — Progress Notes (Signed)
CRITICAL VALUE ALERT  Critical value received:  Troponin 0.06  Date of notification:  08/08/2016  Time of notification:  0514  Critical value read back: Yes   Nurse who received alert:  Hermina Barters  Patient's primary nurse made aware.

## 2016-08-08 NOTE — H&P (Signed)
History and Physical    [name] [id number] DOB: [birth year] DOA: 08/07/2016  PCP: Redge Gainer, MD   Patient coming from: Home.  Chief Complaint: Shortness of breath and leg swelling.  HPI: [name] is a 83 y.o. female with medical history significant of angina, CAD, anxiety, osteoarthritis, paroxysmal atrial fibrillation, carotid artery disease, COPD, GERD, hyperlipidemia, hypertension, mitral valve regurgitation, pulmonary hypertension, CVA, type 2 diabetes who is coming to the emergency department with a week history of progressively worse dyspnea and lower extremity edema despite treatment with oral furosemide and torsemide. She denies fever, rhinorrhea, sore throat, chest pain, diaphoresis, but complains of palpitations, dyspnea on exertion, productive cough of whitish yellowish sputum, PND, orthopnea and significant lower extremity swelling. Denies abdominal pain, but complains of occasional nausea, denies diarrhea, constipation, melena or hematochezia. She denies GU symptoms. She was recently treated for pneumonia.  ED Course: The patient received supplemental oxygen and furosemide in the emergency department with substantial improvement. EKG sinus rhythm, RBBB, LPFB, slight wander without significant change from previous tracing. Her current O2 sats is in the high 90s and respiratory rate is 16. Her INR is 2.48, WBC 5.6, hemoglobin 10.8 and platelets 213. Troponin level was normal, BNP was 547 pg/mL. Sodium 141, potassium 4.1, chloride 98, bicarbonate 36 mmol/L. Her BUN was 25, creatinine 1.27 and glucose 219 mg/dL. Her chest radiograph shows interstitial edema.  Review of Systems: As per HPI otherwise 10 point review of systems negative.    Past Medical History:  Diagnosis Date  . Angina   . Anxiety   . Arthritis    "all over"  . Bleeds easily ([place])   . Bradycardia 08/24/2011  . CAD (coronary artery disease)    Cath in 2006 showed an occluded RCA with left-to-right  collaterals & otherwise noncritical CAD with EF=25-30% at that time. EF subsequently improved to normal by 2D ECHO. Cath Aug. 2011 showed unchanged anatomy. > medical therapy recommended  . Carotid artery disease (HCC)    Moderate, right greater than left which we following by duplex ultrasound. [place] 12/05/11 = RIght Bulb/Prox ICA: Moderate to severe amt of fibrous plaque elevating velocities w/in prox segment of ICA. Consistent w/a 50-69% diameter reduction. Left Bulb/Prox ICA: Moderate amt of fibrous plaque slightly elevating velocities w/in the prox segment of the ICA. Consistent w/a 0-49% diameter reduction.   . Carotid bruit   . CHF (congestive heart failure) ([place])    Hospitalized 08/22/11-08/26/11 with CHF secondary to diastolic dysfunction & hospitilized in 07/2012 with a similar problem. TTE 08/23/11 = normal EF.  Marland Kitchen Chronic lower back pain   . COPD (chronic obstructive pulmonary disease) (HCC)    GOLD 4 - PFT 06/08/10 FEV1 0.93 (62%), FEV1% 60, TLC 2.84 (69%0, DLCO 54%, +BD) On home O2.  Marland Kitchen COPD with emphysema ([place])   . Coronary atherosclerosis of unspecified type of vessel, native or graft   . Diastolic dysfunction    Grade II  . Edema   . Fall during current hospitalization    "was at [facility]; fell; transferred to [facility]" (01/13/2015)  . Family history of adverse reaction to anesthesia    "daughter gets PONV"  . GERD (gastroesophageal reflux disease)   . History of stress test 07/02/09   Mild perfusion defect seen in the Basal Inferior region(s). This is consistent with an infarct/scar.  Post-stress EF=56%. Global LV systolic function is normal.  EKG is negative for ischemia.  No significant iscemia detected. Low risk scan.  . Hyperlipidemia   .  Hypertension   . Hypoxemic respiratory failure, chronic (HCC)    uses 2.5 liters of oxygen with sleep  . Lower extremity edema    ECHO 07/21/12 = EF 55-60%, performed for TIA. Responding to diurectics. Continue diuresis w/ a goal dry weight of <160lb.  Venous Duplex 07/27/11 = Right lower extremity: no evidence of thrombus or thrombophlebitis. Essentially normal right lower extremity venous duplex Doppler evaluation.  . Mitral valve regurgitation    Mild to moderate by ECHO 07/21/12  . Myocardial infarction    "she's had several" (01/13/2015)  . On home oxygen therapy    "?L; prn" (01/13/2015)  . PAF (paroxysmal atrial fibrillation) (HCC)    In the past, on coumadin  . Pneumonia ~ 2013  . PONV (postoperative nausea and vomiting)   . RBBB (right bundle branch block)    Chronic  . Secondary pulmonary hypertension   . Shortness of breath   . Sleep apnea   . Sleep-related hypoventilation   . Stroke [facility])    "/CT scan; ~ 2014; didn't even know she'd had it"  (01/13/2015)  . Type II diabetes mellitus (HCC)    goal A1C is 8, to avoid hypoglycemia    Past Surgical History:  Procedure Laterality Date  . CARDIAC CATHETERIZATION  2006 & 2011   Cath in 2006 showed an occluded RCA with left-to-right collaterals & otherwise noncritical CAD with EF=25-30% at that time. EF subsequently improved to normal by 2D ECHO. Cath Aug. 2011 showed unchanged anatomy. > medical therapy recommended.  . Carotid Duplex  12/05/11   Moderate, right greater than left which we following by duplex ultrasound. [place] 12/05/11 = RIght Bulb/Prox ICA: Moderate to severe amt of fibrous plaque elevating velocities w/in prox segment of ICA. Consistent w/a 50-69% diameter reduction. Left Bulb/Prox ICA: Moderate amt of fibrous plaque slightly elevating velocities w/in the prox segment of the ICA. Consistent w/a 0-49% diameter reduction.   Marland Kitchen CATARACT EXTRACTION W/ INTRAOCULAR LENS  IMPLANT, BILATERAL Bilateral   . CORONARY ANGIOPLASTY    . CORONARY ANGIOPLASTY WITH STENT PLACEMENT     x2  . FEMUR IM NAIL Left 01/15/2015   Procedure: INTRAMEDULLARY (IM) NAIL FEMORAL LEFT ;  Surgeon: Netta Cedars, MD;  Location: [place];  Service: Orthopedics;  Laterality: Left;  . SHOULDER OPEN ROTATOR  CUFF REPAIR Right 07/2005  . TONSILLECTOMY    . TOTAL ABDOMINAL HYSTERECTOMY  ~ 1967  . TRANSVAGINAL TAPE (TVT) REMOVAL    . Venous Duplex  07/27/11   Venous Duplex 07/27/11 = Right lower extremity: no evidence of thrombus or thrombophlebitis. Essentially normal right lower extremity venous duplex Doppler evaluation.     reports that she has quit smoking. Her smoking use included Cigarettes. She has a 22.50 pack-year smoking history. She has never used smokeless tobacco. She reports that she drinks about 3.6 oz of alcohol per week . She reports that she does not use drugs.  Allergies  Allergen Reactions  . Atorvastatin Other (See Comments)     muscle aches  . Codeine Nausea And Vomiting  . Morphine Nausea And Vomiting  . Other Other (See Comments)    OPIATES cause nausea and vomiting  . Rosuvastatin Other (See Comments)    muscle aches at high doses, held as of 12/2010 due to aches  . Sulfa Antibiotics Nausea And Vomiting  . Iohexol Other (See Comments)     Desc: unknown reaction; allergic to iodine and contrast     Family History  Problem Relation Age of  Onset  . Cancer Mother     Skin  . Heart disease Mother   . Emphysema Father   . Heart disease Father   . Congestive Heart Failure Father   . Heart disease Brother   . Heart disease Brother   . Early death Sister     Prior to Admission medications   Medication Sig Start Date End Date Taking? Authorizing Provider  ACCU-CHEK AVIVA PLUS test strip Test tid. Dx E11.9 11/27/15   Chipper Herb, MD  acetaminophen (TYLENOL) 500 MG tablet Take 500 mg by mouth every 4 (four) hours as needed for mild pain.     Historical Provider, MD  ALPRAZolam (XANAX) 0.25 MG tablet TAKE 1/2 TO 1 TABLET 2 TIMES A DAY AS NEEDED FOR ANXIETY 07/03/15   Sharion Balloon, FNP  amiodarone (PACERONE) 200 MG tablet TAKE (1/2) TABLET DAILY. 07/19/16   Chesley Mires, MD  aspirin EC 81 MG tablet Take 81 mg by mouth daily with breakfast.     Historical Provider, MD    atorvastatin (LIPITOR) 20 MG tablet TAKE 1 TABLET DAILY IN THE EVENING Patient taking differently: TAKE 20 mg by mouth at bedtime 05/04/16   Chipper Herb, MD  Azelastine HCl 0.15 % SOLN Place 1 spray into both nostrils 2 (two) times daily as needed (congestion).    Historical Provider, MD  azithromycin (ZITHROMAX) 250 MG tablet  07/29/16   Historical Provider, MD  baclofen (LIORESAL) 10 MG tablet TAKE 1/2 TABLET EVERY 8 HOURS AS NEEDED FOR MUSCLE SPASM 07/04/16   Timmothy Euler, MD  budesonide-formoterol [facility]) 160-4.5 MCG/ACT inhaler Inhale 1 puff into the lungs 2 (two) times daily. Patient taking differently: Inhale 1 puff into the lungs 2 (two) times daily as needed (wheezing).  01/24/14   Chipper Herb, MD  cholecalciferol (VITAMIN D) 1000 units tablet Take 1,000 Units by mouth daily.    Historical Provider, MD  docusate sodium (COLACE) 100 MG capsule Take 100 mg by mouth daily as needed.     Historical Provider, MD  ferrous sulfate 325 (65 FE) MG tablet TAKE 1 TABLET ONCE DAILY WITH BREAKFAST 02/22/16   Chipper Herb, MD  fluticasone [facility]) 50 MCG/ACT nasal spray Place 1 spray into both nostrils 2 (two) times daily. Patient taking differently: Place 1 spray into both nostrils as needed.  12/24/13   Chesley Mires, MD  glucosamine-chondroitin 500-400 MG tablet Take 1 tablet by mouth 2 (two) times daily.    Historical Provider, MD  guaifenesin (HUMIBID E) 400 MG TABS tablet Take 600 mg by mouth 2 (two) times daily.    Historical Provider, MD  HUMALOG KWIKPEN 100 UNIT/ML KiwkPen INJECT UP TO 8 UNITS BEFORE MEALS AS DIRECTED BY SLIDING SCALE. MAX 24 UNITS PER DAY 04/07/15   Fransisca Kaufmann Dettinger, MD  hydrocortisone (ANUSOL-HC) 25 MG suppository Place 1 suppository (25 mg total) rectally 2 (two) times daily as needed for hemorrhoids or itching. 07/30/15   Fransisca Kaufmann Dettinger, MD  Insulin Pen Needle (PEN NEEDLES) 31G X 6 MM MISC Use to administer insulin up to qid 05/25/15   Chipper Herb, MD   Ipratropium-Albuterol (COMBIVENT) 20-100 MCG/ACT AERS respimat Inhale 1 puff into the lungs every 6 (six) hours as needed for wheezing or shortness of breath. 01/24/14   Chipper Herb, MD  LEVEMIR FLEXTOUCH 100 UNIT/ML Pen INJECT UP TO 20 UNITS SQ EVERY MORNING AS DIRECTED Patient taking differently: INJECT 8 units once a day each morning 05/04/16  Chipper Herb, MD  metoprolol succinate (TOPROL-XL) 25 MG 24 hr tablet TAKE (1/2) TABLET DAILY. Patient taking differently: Take 25mg  by mouth in the morning 05/04/16   Chipper Herb, MD  Multiple Vitamins-Minerals (CENTRUM ADULTS) TABS Take 1 tablet by mouth daily with breakfast.    Historical Provider, MD  Multiple Vitamins-Minerals (PRESERVISION AREDS) CAPS Take 1 capsule by mouth daily with breakfast.    Historical Provider, MD  NIFEdipine (PROCARDIA-XL/ADALAT-CC/NIFEDICAL-XL) 30 MG 24 hr tablet TAKE 1 TABLET ONCE DAILY 07/19/16   Chesley Mires, MD  nitroGLYCERIN (NITROSTAT) 0.4 MG SL tablet Place 1 tablet (0.4 mg total) under the tongue every 5 (five) minutes as needed for chest pain. 05/09/14   Mary-Margaret Hassell Done, FNP  ondansetron (ZOFRAN ODT) 4 MG disintegrating tablet Take 1 tablet (4 mg total) by mouth every 8 (eight) hours as needed for nausea or vomiting. 05/08/16   Fredia Sorrow, MD  OXYGEN Inhale 2.5 L into the lungs daily.    Historical Provider, MD  pantoprazole (PROTONIX) 40 MG tablet Take 1 tablet (40 mg total) by mouth daily. Patient taking differently: Take 40 mg by mouth daily with breakfast.  08/12/15   Chipper Herb, MD  polyethylene glycol [facility]) packet Take 17 g by mouth 2 (two) times daily as needed for moderate constipation. 05/17/16   Virgel Manifold, MD  polyethylene glycol powder (GLYCOLAX/MIRALAX) powder Take 17 g by mouth daily as needed. Patient taking differently: Take 17 g by mouth daily as needed for mild constipation.  07/30/15   Fransisca Kaufmann Dettinger, MD  potassium chloride SA (K-DUR,KLOR-CON) 20 MEQ tablet  TAKE 1 TABLET DAILY 07/20/16   Chipper Herb, MD  PRODIGY TWIST TOP LANCETS 28G MISC CHECK BLOOD SUGAR UP TO 4 TIMES A DAY 05/23/16   Chipper Herb, MD  simethicone (MYLICON) 80 MG chewable tablet Chew 80 mg by mouth at bedtime as needed for flatulence.    Historical Provider, MD  torsemide (DEMADEX) 20 MG tablet TAKE  (1)  TABLET TWICE A DAY. 06/17/16   Timmothy Euler, MD  traMADol (ULTRAM) 50 MG tablet Take 1 tablet (50 mg total) by mouth every 6 (six) hours as needed for severe pain. 12/26/15   Julianne Rice, MD  vitamin C (ASCORBIC ACID) 500 MG tablet Take 500 mg by mouth at bedtime.     Historical Provider, MD  warfarin (COUMADIN) 4 MG tablet Take 1.5 tablets (6 mg total) by mouth daily. 07/25/16   Cherre Robins, PharmD    Physical Exam:  Constitutional: NAD, calm, comfortable Vitals:   08/07/16 2324 08/07/16 2330 08/08/16 0015 08/08/16 0146  BP: 142/56 127/71 126/95 (!) 161/75  Pulse: (!) 55 (!) 56 (!) 50 (!) 56  Resp: 12 20 20 19   Temp:    97.3 F (36.3 C)  TempSrc:    Oral  SpO2: 100% 100% 100% 100%  Weight:    53.4 kg (117 lb 11.2 oz)  Height:    5' (1.524 m)   Eyes: PERRL, lids and conjunctivae normal ENMT: Moderate hearing impairment. Mucous membranes are moist. Posterior pharynx clear of any exudate or lesions. Neck: normal, supple, no masses, no thyromegaly Respiratory: Decreased breath sounds on bases. Bibasilar rales. Normal respiratory effort. No accessory muscle use.  Cardiovascular: Regular rate and rhythm, no murmurs / rubs / gallops. 3+ lower extremity edema.  No carotid bruits.  Abdomen: Soft, no tenderness, no masses palpated. No hepatosplenomegaly. Bowel sounds positive.  Musculoskeletal: no clubbing / cyanosis. . Good ROM,  no contractures. Normal muscle tone.  Skin: Multiple areas of ecchymosis. Neurologic: CN 2-12 grossly intact. Sensation intact, DTR normal. Strength 5/5 in all 4.  Psychiatric:  Alert and oriented x 3. Normal mood.    Labs on Admission:  I have personally reviewed following labs and imaging studies  CBC:  Recent Labs Lab 08/07/16 2138 08/07/16 2158  WBC 5.6  --   NEUTROABS 3.8  --   HGB 10.8* 11.9*  HCT 35.6* 35.0*  MCV 100.3*  --   PLT 213  --    Basic Metabolic Panel:  Recent Labs Lab 08/07/16 2138 08/07/16 2158  NA 141 143  K 4.1 4.0  CL 98* 98*  CO2 36*  --   GLUCOSE 219* 223*  BUN 25* 28*  CREATININE 1.27* 1.50*  CALCIUM 9.5  --    GFR: Estimated Creatinine Clearance: 19.3 mL/min (by C-G formula based on SCr of 1.5 mg/dL (H)). Liver Function Tests:  Recent Labs Lab 08/07/16 2138  AST 28  ALT 43  ALKPHOS 79  BILITOT 0.6  PROT 5.9*  ALBUMIN 3.1*   No results for input(s): LIPASE, AMYLASE in the last 168 hours. No results for input(s): AMMONIA in the last 168 hours. Coagulation Profile:  Recent Labs Lab 08/02/16 08/02/16 1607 08/07/16 2246  INR 2.2 2.2* 2.48   Cardiac Enzymes: No results for input(s): CKTOTAL, CKMB, CKMBINDEX, TROPONINI in the last 168 hours. BNP (last 3 results) No results for input(s): PROBNP in the last 8760 hours. HbA1C: No results for input(s): HGBA1C in the last 72 hours. CBG: No results for input(s): GLUCAP in the last 168 hours. Lipid Profile: No results for input(s): CHOL, HDL, LDLCALC, TRIG, CHOLHDL, LDLDIRECT in the last 72 hours. Thyroid Function Tests: No results for input(s): TSH, T4TOTAL, FREET4, T3FREE, THYROIDAB in the last 72 hours. Anemia Panel: No results for input(s): VITAMINB12, FOLATE, FERRITIN, TIBC, IRON, RETICCTPCT in the last 72 hours. Urine analysis:    Component Value Date/Time   COLORURINE YELLOW 04/06/2015 1929   APPEARANCEUR Clear 06/14/2016 1102   LABSPEC 1.012 04/06/2015 1929   PHURINE 6.0 04/06/2015 1929   GLUCOSEU Negative 06/14/2016 1102   HGBUR NEGATIVE 04/06/2015 1929   BILIRUBINUR Negative 06/14/2016 [place] 04/06/2015 1929   PROTEINUR 1+ (A) 06/14/2016 1102   PROTEINUR 30 (A) 04/06/2015 1929    UROBILINOGEN negative 05/20/2015 0949   UROBILINOGEN 0.2 04/06/2015 1929   NITRITE Negative 06/14/2016 1102   NITRITE NEGATIVE 04/06/2015 1929   LEUKOCYTESUR Negative 06/14/2016 1102    Radiological Exams on Admission: Dg Chest 2 View  Result Date: 08/07/2016 CLINICAL DATA:  Dyspnea. Recent history of pneumonia with antibiotic administration. Pitting edema in lower extremities. EXAM: CHEST  2 VIEW COMPARISON:  None. FINDINGS: Cardiomegaly with aortic atherosclerosis. The thoracic aorta is slightly uncoiled in appearance without aneurysm. Mild interstitial edema is noted with trace posterior pleural effusions. Trace fluid in the right major fissure. No pulmonary consolidation. No pneumothorax. Degenerative changes along the dorsal spine. IMPRESSION: Stable cardiomegaly with aortic atherosclerosis. Mild interstitial pulmonary edema. Trace posterior pleural effusions. Electronically Signed   By: Ashley Royalty M.D.   On: 08/07/2016 22:40   Echocardiogram 01/20/2015  LV EF: 45% -   50%  ------------------------------------------------------------------- Indications:      Chest pain 786.51.  ------------------------------------------------------------------- History:   PMH:  RBBB.  Chronic obstructive pulmonary disease. Risk factors:  Hypertension. Diabetes mellitus. Dyslipidemia.  ------------------------------------------------------------------- Study Conclusions  - Left ventricle: The cavity size was normal. Wall thickness was  normal. Systolic function was mildly reduced. The estimated   ejection fraction was in the range of 45% to 50%. Wall motion was   normal; there were no regional wall motion abnormalities. - Mitral valve: There was mild regurgitation. Valve area by   continuity equation (using LVOT flow): 0.72 cm^2. - Pulmonary arteries: Systolic pressure was moderately increased.   PA peak pressure: 55 mm Hg (S).  EKG: Independently reviewed. Vent. rate 63 BPM PR interval  * ms QRS duration 147 ms QT/QTc 461/472 ms P-R-T axes 67 100 -55 Sinus rhythm RBBB and LPFB Baseline wander in lead(s) V3 When compared with ECG of 05/08/2016, No significant change when compared to previous tracing.  Assessment/Plan Principal Problem:   Acute on chronic systolic congestive heart failure (HCC) Admit to telemetry/observation. Continue supplemental oxygen. Furosemide 40 mg IVP every twice a day. Trend troponin levels. Check echocardiogram in the morning. Case management/social services evaluation since the patient lives alone.  Active Problems:   Hyperlipidemia LDL goal <70 Continue atorvastatin. Interval LFTs monitoring. Yearly outpatient fasting lipid profile.    PAF,  on Amiodarone/Coumadin/metoprolol. Continue amiodarone and warfarin per pharmacy. Hold metoprolol due to acute exacerbation.    COPD with emphysema ([place])   Chronic respiratory failure with hypoxia (HCC) Continue supplemental oxygen. Continue daily inhaled glucocorticoid. DuoNeb every 6 hours as needed.      HTN (hypertension) Amlodipine 10 mg by mouth daily. Hold metoprolol due to acute exacerbation. Enalapril 2.5 mg by mouth daily.    CAD, remote RCA PCI, subsequently noted to be occluded, last cath 8/11 Continue warfarin, aspirin and atorvastatin. Metoprolol is on hold.    DM type 2 causing CKD stage 3 (HCC) Carbohydrate modified diet. Continue Levemir 7 units SQ in the morning. CBG monitoring with regular insulin sliding scale.    Anemia Monitor hematocrit and hemoglobin.     DVT prophylaxis: On warfarin. Code Status: Full code. Family Communication: Daughter Santiago Glad was present in the room. Disposition Plan: Admit for IV diuresis, troponin levels trending, echocardiogram and case management evaluation in a.m. Consults called:  Admission status: Observation/telemetry.   Reubin Milan MD [facility] Pager [phone].  If 7PM-7AM, please contact  night-coverage [email or web address] Password [facility]  08/08/2016, 1:49 AM

## 2016-08-08 NOTE — Care Management Note (Signed)
Case Management Note  Patient Details  Name: [name] MRN: [id number] Date of Birth: Jan 25, 1928  Subjective/Objective:       Admitted with CHF            Action/Plan: Patient lives at home; PCP: Redge Gainer, MD; has private insurance with [facility] with prescription drug coverage; CM will continue to follow for DCP  Expected Discharge Date:     Possibly 08/09/2016             Expected Discharge Plan:  Home/Self Care  In-House Referral:   [facility]  Discharge planning Services  CM Consult  Status of Service:  In process, will continue to follow  Sherrilyn Rist [phone] 08/08/2016, 3:43 PM

## 2016-08-08 NOTE — Progress Notes (Signed)
  Echocardiogram 2D Echocardiogram has been performed.  [name] 08/08/2016, 11:07 AM

## 2016-08-08 NOTE — Progress Notes (Signed)
Troponin 0.06. [facility] just notified.

## 2016-08-08 NOTE — Discharge Instructions (Signed)
Information on my medicine - Coumadin®   (Warfarin) ° °This medication education was reviewed with me or my healthcare representative as part of my discharge preparation.  The pharmacist that spoke with me during my hospital stay was:  Coulton Schlink Stillinger, RPH ° °Why was Coumadin prescribed for you? °Coumadin was prescribed for you because you have a blood clot or a medical condition that can cause an increased risk of forming blood clots. Blood clots can cause serious health problems by blocking the flow of blood to the heart, lung, or brain. Coumadin can prevent harmful blood clots from forming. °As a reminder your indication for Coumadin is:   Stroke Prevention Because Of Atrial Fibrillation ° °What test will check on my response to Coumadin? °While on Coumadin (warfarin) you will need to have an INR test regularly to ensure that your dose is keeping you in the desired range. The INR (international normalized ratio) number is calculated from the result of the laboratory test called prothrombin time (PT). ° °If an INR APPOINTMENT HAS NOT ALREADY BEEN MADE FOR YOU please schedule an appointment to have this lab work done by your health care provider within 7 days. °Your INR goal is usually a number between:  2 to 3 or your provider may give you a more narrow range like 2-2.5.  Ask your health care provider during an office visit what your goal INR is. ° °What  do you need to  know  About  COUMADIN? °Take Coumadin (warfarin) exactly as prescribed by your healthcare provider about the same time each day.  DO NOT stop taking without talking to the doctor who prescribed the medication.  Stopping without other blood clot prevention medication to take the place of Coumadin may increase your risk of developing a new clot or stroke.  Get refills before you run out. ° °What do you do if you miss a dose? °If you miss a dose, take it as soon as you remember on the same day then continue your regularly scheduled  regimen the next day.  Do not take two doses of Coumadin at the same time. ° °Important Safety Information °A possible side effect of Coumadin (Warfarin) is an increased risk of bleeding. You should call your healthcare provider right away if you experience any of the following: °  Bleeding from an injury or your nose that does not stop. °  Unusual colored urine (red or dark brown) or unusual colored stools (red or black). °  Unusual bruising for unknown reasons. °  A serious fall or if you hit your head (even if there is no bleeding). ° °Some foods or medicines interact with Coumadin® (warfarin) and might alter your response to warfarin. To help avoid this: °  Eat a balanced diet, maintaining a consistent amount of Vitamin K. °  Notify your provider about major diet changes you plan to make. °  Avoid alcohol or limit your intake to 1 drink for women and 2 drinks for men per day. °(1 drink is 5 oz. wine, 12 oz. beer, or 1.5 oz. liquor.) ° °Make sure that ANY health care provider who prescribes medication for you knows that you are taking Coumadin (warfarin).  Also make sure the healthcare provider who is monitoring your Coumadin knows when you have started a new medication including herbals and non-prescription products. ° °Coumadin® (Warfarin)  Major Drug Interactions  °Increased Warfarin Effect Decreased Warfarin Effect  °Alcohol (large quantities) °Antibiotics (esp. Septra/Bactrim, Flagyl, Cipro) °Amiodarone (Cordarone) °Aspirin (  ASA) °Cimetidine (Tagamet) °Megestrol (Megace) °NSAIDs (ibuprofen, naproxen, etc.) °Piroxicam (Feldene) °Propafenone (Rythmol SR) °Propranolol (Inderal) °Isoniazid (INH) °Posaconazole (Noxafil) Barbiturates (Phenobarbital) °Carbamazepine (Tegretol) °Chlordiazepoxide (Librium) °Cholestyramine (Questran) °Griseofulvin °Oral Contraceptives °Rifampin °Sucralfate (Carafate) °Vitamin K  ° °Coumadin® (Warfarin) Major Herbal Interactions  °Increased Warfarin Effect Decreased Warfarin Effect    °Garlic °Ginseng °Ginkgo biloba Coenzyme Q10 °Green tea °St. John’s wort   ° °Coumadin® (Warfarin) FOOD Interactions  °Eat a consistent number of servings per week of foods HIGH in Vitamin K °(1 serving = ½ cup)  °Collards (cooked, or boiled & drained) °Kale (cooked, or boiled & drained) °Mustard greens (cooked, or boiled & drained) °Parsley *serving size only = ¼ cup °Spinach (cooked, or boiled & drained) °Swiss chard (cooked, or boiled & drained) °Turnip greens (cooked, or boiled & drained)  °Eat a consistent number of servings per week of foods MEDIUM-HIGH in Vitamin K °(1 serving = 1 cup)  °Asparagus (cooked, or boiled & drained) °Broccoli (cooked, boiled & drained, or raw & chopped) °Brussel sprouts (cooked, or boiled & drained) *serving size only = ½ cup °Lettuce, raw (green leaf, endive, romaine) °Spinach, raw °Turnip greens, raw & chopped  ° °These websites have more information on Coumadin (warfarin):  www.coumadin.com; °www.ahrq.gov/consumer/coumadin.htm; ° ° ° °

## 2016-08-08 NOTE — Progress Notes (Addendum)
PROGRESS NOTE  [name] [id number] DOB: 1927-10-17 DOA: 08/07/2016 PCP: Redge Gainer, MD   LOS: 0 days   Brief Narrative: 83 y.o. female with medical history significant of combined CHF, angina, CAD, anxiety, osteoarthritis, paroxysmal atrial fibrillation, carotid artery disease, COPD, GERD, hyperlipidemia, hypertension, mitral valve regurgitation, pulmonary hypertension, CVA, type 2 diabetes who is coming to the emergency department with a week history of progressively worse dyspnea and lower extremity edema despite treatment with oral furosemide and torsemide  Assessment & Plan: Principal Problem:   Acute on chronic systolic congestive heart failure ([name]) Active Problems:   Hyperlipidemia LDL goal <70   PAF, on Amiodarone/Coumadin/metoprolol.   COPD with emphysema (HCC)   Chronic respiratory failure with hypoxia (HCC)   HTN (hypertension)   CAD, remote RCA PCI, subsequently noted to be occluded, last cath 8/11   DM type 2 causing CKD stage 3 (HCC)   Anemia   Acute on chronic systolic congestive heart failure ([name]) - Patient started on IV diuresis, continue weight on admission 117 pounds, continue to monitor daily weights, strict I's and O's - She is on Lasix 40 mg twice daily - 2-D echo pending - Still with significant fluid overload on exam    Hyperlipidemia - Continue atorvastatin.  PAF, on Amiodarone/Coumadin/metoprolol. - Continue amiodarone and warfarin per pharmacy.  CKD III - Cr with slight elevation, continue to monitor while diuresing  COPD with emphysema ([name]) - She is on chronic oxygen at home, 2 L nasal cannula, she is being followed by pulmonology as an outpatient. She has no wheezing currently  Chronic respiratory failure with hypoxia (HCC) - Continue supplemental oxygen.  HTN (hypertension) - Continue home medication, hold enalapril due to mild creatinine elevation  CAD, remote RCA PCI, subsequently noted to be occluded, last cath 8/11 -  Continue warfarin, aspirin and atorvastatin.  DM type 2 causing CKD stage 3 (HCC) - Carbohydrate modified diet. - Continue Levemir, sliding scale. Most recent A1c in 2017 was 7, showing good control   DVT prophylaxis: Coumadin Code Status: FUll code Family Communication: no family bedside Disposition Plan: remain inpatient   Consultants:   None  Procedures:   2D echo: pending  Antimicrobials:  None   Subjective: - Feels a little better this morning, breathing is improved, slept well overnight. Hard of hearing  Objective: Vitals:   08/08/16 0146 08/08/16 0452 08/08/16 0741 08/08/16 0805  BP: (!) 161/75 (!) 158/78  (!) 152/121  Pulse: (!) 56 (!) 57  (!) 53  Resp: 19 19  18   Temp: 97.3 F (36.3 C) 97.3 F (36.3 C)  97.6 F (36.4 C)  TempSrc: Oral Oral  Oral  SpO2: 100% 100% 100% 100%  Weight: 53.4 kg (117 lb 11.2 oz)     Height: 5' (1.524 m)       Intake/Output Summary (Last 24 hours) at 08/08/16 0938 Last data filed at 08/08/16 0851  Gross per 24 hour  Intake              363 ml  Output             1100 ml  Net             -737 ml   Filed Weights   08/07/16 2045 08/08/16 0146  Weight: 53.1 kg (117 lb) 53.4 kg (117 lb 11.2 oz)    Examination: Constitutional: NAD Vitals:   08/08/16 0146 08/08/16 0452 08/08/16 0741 08/08/16 0805  BP: (!) 161/75 (!) 158/78  (!) 152/121  Pulse: (!) 56 (!) 57  (!) 53  Resp: 19 19  18   Temp: 97.3 F (36.3 C) 97.3 F (36.3 C)  97.6 F (36.4 C)  TempSrc: Oral Oral  Oral  SpO2: 100% 100% 100% 100%  Weight: 53.4 kg (117 lb 11.2 oz)     Height: 5' (1.524 m)      Eyes: lids and conjunctivae normal ENMT: Mucous membranes are moist. Respiratory: Bibasilar crackles, no wheezing. Cardiovascular: Regular rate and rhythm, no murmurs / rubs / gallops. 2+ tense LE edema. 2+ pedal pulses. +JVD Abdomen: no tenderness. Bowel sounds positive.  Musculoskeletal: no clubbing / cyanosis. Skin: no rashes, lesions, ulcers. No  induration Neurologic: non focal   Psychiatric: Normal judgment and insight. Alert and oriented x 3. Normal mood.    Data Reviewed: I have personally reviewed following labs and imaging studies  CBC:  Recent Labs Lab 08/07/16 2138 08/07/16 2158  WBC 5.6  --   NEUTROABS 3.8  --   HGB 10.8* 11.9*  HCT 35.6* 35.0*  MCV 100.3*  --   PLT 213  --    Basic Metabolic Panel:  Recent Labs Lab 08/07/16 2138 08/07/16 2158  NA 141 143  K 4.1 4.0  CL 98* 98*  CO2 36*  --   GLUCOSE 219* 223*  BUN 25* 28*  CREATININE 1.27* 1.50*  CALCIUM 9.5  --    GFR: Estimated Creatinine Clearance: 19.3 mL/min (by C-G formula based on SCr of 1.5 mg/dL (H)). Liver Function Tests:  Recent Labs Lab 08/07/16 2138  AST 28  ALT 43  ALKPHOS 79  BILITOT 0.6  PROT 5.9*  ALBUMIN 3.1*   No results for input(s): LIPASE, AMYLASE in the last 168 hours. No results for input(s): AMMONIA in the last 168 hours. Coagulation Profile:  Recent Labs Lab 08/02/16 08/02/16 1607 08/07/16 2246 08/08/16 0358  INR 2.2 2.2* 2.48 2.60   Cardiac Enzymes:  Recent Labs Lab 08/08/16 0358  TROPONINI 0.06*   BNP (last 3 results) No results for input(s): PROBNP in the last 8760 hours. HbA1C: No results for input(s): HGBA1C in the last 72 hours. CBG:  Recent Labs Lab 08/08/16 0749  GLUCAP 132*   Lipid Profile: No results for input(s): CHOL, HDL, LDLCALC, TRIG, CHOLHDL, LDLDIRECT in the last 72 hours. Thyroid Function Tests: No results for input(s): TSH, T4TOTAL, FREET4, T3FREE, THYROIDAB in the last 72 hours. Anemia Panel: No results for input(s): VITAMINB12, FOLATE, FERRITIN, TIBC, IRON, RETICCTPCT in the last 72 hours. Urine analysis:    Component Value Date/Time   COLORURINE YELLOW 04/06/2015 1929   APPEARANCEUR Clear 06/14/2016 1102   LABSPEC 1.012 04/06/2015 1929   PHURINE 6.0 04/06/2015 1929   GLUCOSEU Negative 06/14/2016 1102   HGBUR NEGATIVE 04/06/2015 1929   BILIRUBINUR Negative  06/14/2016 [place] 04/06/2015 1929   PROTEINUR 1+ (A) 06/14/2016 1102   PROTEINUR 30 (A) 04/06/2015 1929   UROBILINOGEN negative 05/20/2015 0949   UROBILINOGEN 0.2 04/06/2015 1929   NITRITE Negative 06/14/2016 1102   NITRITE NEGATIVE 04/06/2015 1929   LEUKOCYTESUR Negative 06/14/2016 1102   Sepsis Labs: Invalid input(s): PROCALCITONIN, LACTICIDVEN  No results found for this or any previous visit (from the past 240 hour(s)).    Radiology Studies: Dg Chest 2 View  Result Date: 08/07/2016 CLINICAL DATA:  Dyspnea. Recent history of pneumonia with antibiotic administration. Pitting edema in lower extremities. EXAM: CHEST  2 VIEW COMPARISON:  None. FINDINGS: Cardiomegaly with aortic atherosclerosis. The thoracic aorta is slightly uncoiled  in appearance without aneurysm. Mild interstitial edema is noted with trace posterior pleural effusions. Trace fluid in the right major fissure. No pulmonary consolidation. No pneumothorax. Degenerative changes along the dorsal spine. IMPRESSION: Stable cardiomegaly with aortic atherosclerosis. Mild interstitial pulmonary edema. Trace posterior pleural effusions. Electronically Signed   By: Ashley Royalty M.D.   On: 08/07/2016 22:40     Scheduled Meds: . amiodarone  100 mg Oral Daily  . furosemide  40 mg Intravenous BID  . guaiFENesin  600 mg Oral BID  . insulin aspart  0-9 Units Subcutaneous TID WC  . insulin detemir  7 Units Subcutaneous Daily  . lisinopril  2.5 mg Oral Daily  . mometasone-formoterol  2 puff Inhalation BID  . NIFEdipine  30 mg Oral Daily  . pantoprazole  40 mg Oral Q breakfast  . potassium chloride SA  20 mEq Oral Daily  . sodium chloride flush  3 mL Intravenous Q12H   Continuous Infusions:  Marzetta Board, MD, PhD [facility] Pager [phone]  If 7PM-7AM, please contact night-coverage [email or web address] Password [facility] 08/08/2016, 9:38 AM

## 2016-08-09 LAB — CBC
HCT: 35.3 % — ABNORMAL LOW (ref 36.0–46.0)
Hemoglobin: 10.6 g/dL — ABNORMAL LOW (ref 12.0–15.0)
MCH: 30 pg (ref 26.0–34.0)
MCHC: 30 g/dL (ref 30.0–36.0)
MCV: 100 fL (ref 78.0–100.0)
Platelets: 175 10*3/uL (ref 150–400)
RBC: 3.53 MIL/uL — ABNORMAL LOW (ref 3.87–5.11)
RDW: 14.1 % (ref 11.5–15.5)
WBC: 4.8 10*3/uL (ref 4.0–10.5)

## 2016-08-09 LAB — BASIC METABOLIC PANEL
Anion gap: 13 (ref 5–15)
BUN: 28 mg/dL — ABNORMAL HIGH (ref 6–20)
CO2: 34 mmol/L — ABNORMAL HIGH (ref 22–32)
Calcium: 9.1 mg/dL (ref 8.9–10.3)
Chloride: 97 mmol/L — ABNORMAL LOW (ref 101–111)
Creatinine, Ser: 1.35 mg/dL — ABNORMAL HIGH (ref 0.44–1.00)
GFR calc Af Amer: 40 mL/min — ABNORMAL LOW (ref >60)
GFR calc non Af Amer: 34 mL/min — ABNORMAL LOW (ref >60)
Glucose, Bld: 97 mg/dL (ref 65–99)
Potassium: 4 mmol/L (ref 3.5–5.1)
Sodium: 144 mmol/L (ref 135–145)

## 2016-08-09 LAB — GLUCOSE, CAPILLARY
GLUCOSE-CAPILLARY: 95 mg/dL (ref 65–99)
Glucose-Capillary: 148 mg/dL — ABNORMAL HIGH (ref 65–99)
Glucose-Capillary: 197 mg/dL — ABNORMAL HIGH (ref 65–99)
Glucose-Capillary: 180 mg/dL — ABNORMAL HIGH (ref 65–99)

## 2016-08-09 LAB — PROTIME-INR
INR: 2.85
Prothrombin Time: 30.5 seconds — ABNORMAL HIGH (ref 11.4–15.2)

## 2016-08-09 MED ORDER — ATORVASTATIN CALCIUM 20 MG PO TABS
20.0000 mg | ORAL_TABLET | Freq: Every day | ORAL | Status: DC
  Administered 2016-08-09 – 2016-08-11 (×3): 20 mg via ORAL
  Filled 2016-08-09 (×3): qty 1

## 2016-08-09 MED ORDER — MUSCLE RUB 10-15 % EX CREA
TOPICAL_CREAM | Freq: Two times a day (BID) | CUTANEOUS | Status: DC | PRN
  Administered 2016-08-09: 11:00:00 via TOPICAL
  Filled 2016-08-09: qty 85

## 2016-08-09 MED ORDER — FLUTICASONE PROPIONATE 50 MCG/ACT NA SUSP: 2.0000 | Freq: Every day | NASAL | Status: DC | PRN

## 2016-08-09 MED ORDER — TROLAMINE SALICYLATE 10 % EX CREA: TOPICAL_CREAM | Freq: Two times a day (BID) | CUTANEOUS | Status: DC | PRN

## 2016-08-09 MED ORDER — FUROSEMIDE 10 MG/ML IJ SOLN
60.0000 mg | Freq: Two times a day (BID) | INTRAMUSCULAR | Status: DC
  Administered 2016-08-09 – 2016-08-11 (×4): 60 mg via INTRAVENOUS
  Filled 2016-08-09 (×4): qty 6

## 2016-08-09 NOTE — Consult Note (Signed)
   [facility] Inpatient Consult   08/09/2016  Jerelene Salaam [birth year] [id number]   Patient was assessed for [place] [facility] for community services. Patient was previously active with [facility] with HF EMMI calls and [facility] Telephonic RN.   Chart review reveals the patient is an 83 y.o.femalewith medical history significant of combined CHF, angina, CAD, anxiety, osteoarthritis, paroxysmal atrial fibrillation, carotid artery disease, COPD, GERD, hyperlipidemia, hypertension, mitral valve regurgitation, pulmonary hypertension, CVA, type 2 diabetes who is coming to the emergency department with a week history of progressively worse dyspnea and lower extremity edema despite treatment with oral furosemide and torsemide per MD notes prior to admission. Met with patient and daughter Santiago Glad at bedside regarding being restarted with [facility] services.  Patient states she was having difficulty with the automated calls because of her hearing deficits and leggally blind.  Consent form signed and folder with [place] [facility] information given for post hospital follow up and assessed for monthly home visits.  Of note, [facility] Care Management services does not replace or interfere with any services that are arranged by inpatient case management or social work. For additional questions or referrals please contact:  Natividad Brood, RN BSN [facility] Liaison  [phone] business mobile phone Toll free office [phone]

## 2016-08-09 NOTE — Progress Notes (Signed)
Bilateral compression wraps placed.

## 2016-08-09 NOTE — Progress Notes (Signed)
ANTICOAGULATION CONSULT NOTE - Follow Up Consult  Pharmacy Consult for Coumadin Indication: atrial fibrillation  Allergies  Allergen Reactions  . Atorvastatin Other (See Comments)     muscle aches  . Codeine Nausea And Vomiting  . Morphine Nausea And Vomiting  . Other Other (See Comments)    OPIATES cause nausea and vomiting  . Rosuvastatin Other (See Comments)    muscle aches at high doses, held as of 12/2010 due to aches  . Sulfa Antibiotics Nausea And Vomiting  . Iohexol Other (See Comments)     Desc: unknown reaction; allergic to iodine and contrast     Patient Measurements: Height: 5' (152.4 cm) Weight: 117 lb 8 oz (53.3 kg) (scale c) IBW/kg (Calculated) : 45.5 Heparin Dosing Weight:   Vital Signs: Temp: 97.1 F (36.2 C) (02/13 0616) Temp Source: Oral (02/13 0616) BP: 145/52 (02/13 0616) Pulse Rate: 55 (02/13 0616)  Labs:  Recent Labs  08/07/16 2138 08/07/16 2158 08/07/16 2246 08/08/16 0358 08/08/16 1115 08/09/16 0534  HGB 10.8* 11.9*  --   --   --  10.6*  HCT 35.6* 35.0*  --   --   --  35.3*  PLT 213  --   --   --   --  175  LABPROT  --   --  27.3* 28.4*  --  30.5*  INR  --   --  2.48 2.60  --  2.85  CREATININE 1.27* 1.50*  --   --   --  1.35*  TROPONINI  --   --   --  0.06* 0.05*  --     Estimated Creatinine Clearance: 21.5 mL/min (by C-G formula based on SCr of 1.35 mg/dL (H)).   Assessment:  Anticoag: Coumadin PTA for afib. Admission INR 2.48 (therapeutic). CBC stable.INR 2.48>2.6>2.85 today. Hgb relatively consistent. - PTA dose 6mg  daily  Goal of Therapy:  INR 2-3 Monitor platelets by anticoagulation protocol: Yes   Plan:  Daily INR Coumadin 6mg  po daily  [name] S. Alford Highland, PharmD, BCPS Clinical Staff Pharmacist Pager [phone]  [name] 08/09/2016,12:38 PM

## 2016-08-09 NOTE — Progress Notes (Signed)
PROGRESS NOTE  [name] [id number] DOB: May 27, 1928 DOA: 08/07/2016 PCP: Redge Gainer, MD   LOS: 1 day   Brief Narrative: 83 y.o. female with medical history significant of combined CHF, angina, CAD, anxiety, osteoarthritis, paroxysmal atrial fibrillation, carotid artery disease, COPD, GERD, hyperlipidemia, hypertension, mitral valve regurgitation, pulmonary hypertension, CVA, type 2 diabetes who is coming to the emergency department with a week history of progressively worse dyspnea and lower extremity edema despite treatment with oral furosemide and torsemide  Assessment & Plan: Principal Problem:   Acute on chronic systolic congestive heart failure ([name]) Active Problems:   Hyperlipidemia LDL goal <70   PAF, on Amiodarone/Coumadin/metoprolol.   COPD with emphysema (HCC)   Chronic respiratory failure with hypoxia (HCC)   HTN (hypertension)   CAD, remote RCA PCI, subsequently noted to be occluded, last cath 8/11   DM type 2 causing CKD stage 3 (HCC)   Anemia   Acute on chronic combined systolic and diastolic congestive heart failure ([name]) - Patient started on IV diuresis, continue, weight on admission 117 pounds hasn't improved much today, we'll increase Lasix dose - continue to monitor daily weights, strict I's and O's - 2-D echo pending with normal EF and grade 3 diastolic dysfunction - Still with significant fluid overload on exam    Hyperlipidemia - Continue atorvastatin.  PAF, on Amiodarone/Coumadin/metoprolol. - Continue amiodarone and warfarin per pharmacy.  CKD III - Cr with slight elevation, continue to monitor while diuresing, improving, stable   COPD with emphysema ([name]) - She is on chronic oxygen at home, 2 L nasal cannula, she is being followed by pulmonology as an outpatient. She has no wheezing currently  Chronic respiratory failure with hypoxia (HCC) - Continue supplemental oxygen.  HTN (hypertension) - Continue home medication, hold enalapril  due to mild creatinine elevation  CAD, remote RCA PCI, subsequently noted to be occluded, last cath 8/11 - Continue warfarin, aspirin and atorvastatin.  DM type 2 causing CKD stage 3 (HCC) - Carbohydrate modified diet. - Continue Levemir, sliding scale. Most recent A1c in 2017 was 7, showing good control   DVT prophylaxis: Coumadin Code Status: FUll code Family Communication: no family bedside Disposition Plan: remain inpatient   Consultants:   None  Procedures:   2D echo Study Conclusions - Left ventricle: The cavity size was normal. There was moderate concentric hypertrophy. Systolic function was normal. The estimated ejection fraction was in the range of 55% to 60%. Wall motion was normal; there were no regional wall motion abnormalities. There was a reduced contribution of atrial contraction to ventricular filling, due to increased ventricular diastolic pressure or atrial contractile dysfunction. Doppler parameters are consistent with a reversible restrictive pattern, indicative of decreased left ventricular diastolic compliance and/or increased left atrial pressure (grade 3 diastolic dysfunction). Doppler parameters are consistent with both elevated ventricular end-diastolic filling pressure and elevated left atrial filling pressure. - Aortic valve: Valve mobility was restricted. - Mitral valve: Calcified annulus. Mildly thickened, mildly calcified leaflets . There was mild regurgitation. - Left atrium: The atrium was moderately dilated. - Right atrium: The atrium was moderately dilated. - Pulmonary arteries: PA peak pressure: 38 mm Hg (S).  Antimicrobials:  None   Subjective: - Feels a little better this morning, breathing is improved, slept well overnight. Hard of hearing  Objective: Vitals:   08/08/16 2027 08/09/16 0003 08/09/16 0616 08/09/16 1016  BP:  126/64 (!) 145/52   Pulse:  (!) 49 (!) 55   Resp:  18 18   Temp:  97.9 F (36.6 C) 97.1 F (36.2 C)   TempSrc:   Oral Oral   SpO2: 95% 100% 100% 95%  Weight:   53.3 kg (117 lb 8 oz)   Height:        Intake/Output Summary (Last 24 hours) at 08/09/16 1057 Last data filed at 08/09/16 0950  Gross per 24 hour  Intake              603 ml  Output             1000 ml  Net             -397 ml   Filed Weights   08/07/16 2045 08/08/16 0146 08/09/16 0616  Weight: 53.1 kg (117 lb) 53.4 kg (117 lb 11.2 oz) 53.3 kg (117 lb 8 oz)    Examination: Constitutional: NAD Vitals:   08/08/16 2027 08/09/16 0003 08/09/16 0616 08/09/16 1016  BP:  126/64 (!) 145/52   Pulse:  (!) 49 (!) 55   Resp:  18 18   Temp:  97.9 F (36.6 C) 97.1 F (36.2 C)   TempSrc:  Oral Oral   SpO2: 95% 100% 100% 95%  Weight:   53.3 kg (117 lb 8 oz)   Height:       Eyes: lids and conjunctivae normal ENMT: Mucous membranes are moist. Respiratory: Bibasilar crackles, no wheezing. Cardiovascular: Regular rate and rhythm, no murmurs / rubs / gallops. 2+ tense LE edema. 2+ pedal pulses. +JVD Abdomen: no tenderness. Bowel sounds positive.  Musculoskeletal: no clubbing / cyanosis. Skin: no rashes, lesions, ulcers. No induration Neurologic: non focal   Psychiatric: Normal judgment and insight. Alert and oriented x 3. Normal mood.    Data Reviewed: I have personally reviewed following labs and imaging studies  CBC:  Recent Labs Lab 08/07/16 2138 08/07/16 2158 08/09/16 0534  WBC 5.6  --  4.8  NEUTROABS 3.8  --   --   HGB 10.8* 11.9* 10.6*  HCT 35.6* 35.0* 35.3*  MCV 100.3*  --  100.0  PLT 213  --  0000000   Basic Metabolic Panel:  Recent Labs Lab 08/07/16 2138 08/07/16 2158 08/09/16 0534  NA 141 143 144  K 4.1 4.0 4.0  CL 98* 98* 97*  CO2 36*  --  34*  GLUCOSE 219* 223* 97  BUN 25* 28* 28*  CREATININE 1.27* 1.50* 1.35*  CALCIUM 9.5  --  9.1   GFR: Estimated Creatinine Clearance: 21.5 mL/min (by C-G formula based on SCr of 1.35 mg/dL (H)). Liver Function Tests:  Recent Labs Lab 08/07/16 2138  AST 28  ALT 43    ALKPHOS 79  BILITOT 0.6  PROT 5.9*  ALBUMIN 3.1*   No results for input(s): LIPASE, AMYLASE in the last 168 hours. No results for input(s): AMMONIA in the last 168 hours. Coagulation Profile:  Recent Labs Lab 08/02/16 1607 08/07/16 2246 08/08/16 0358 08/09/16 0534  INR 2.2* 2.48 2.60 2.85   Cardiac Enzymes:  Recent Labs Lab 08/08/16 0358 08/08/16 1115  TROPONINI 0.06* 0.05*   BNP (last 3 results) No results for input(s): PROBNP in the last 8760 hours. HbA1C: No results for input(s): HGBA1C in the last 72 hours. CBG:  Recent Labs Lab 08/08/16 0749 08/08/16 1137 08/08/16 1640 08/08/16 2203 08/09/16 0745  GLUCAP 132* 191* 194* 143* 95   Lipid Profile: No results for input(s): CHOL, HDL, LDLCALC, TRIG, CHOLHDL, LDLDIRECT in the last 72 hours. Thyroid Function Tests: No results for input(s): TSH, T4TOTAL, FREET4,  T3FREE, THYROIDAB in the last 72 hours. Anemia Panel: No results for input(s): VITAMINB12, FOLATE, FERRITIN, TIBC, IRON, RETICCTPCT in the last 72 hours. Urine analysis:    Component Value Date/Time   COLORURINE YELLOW 04/06/2015 1929   APPEARANCEUR Clear 06/14/2016 1102   LABSPEC 1.012 04/06/2015 1929   PHURINE 6.0 04/06/2015 1929   GLUCOSEU Negative 06/14/2016 1102   HGBUR NEGATIVE 04/06/2015 1929   BILIRUBINUR Negative 06/14/2016 [place] 04/06/2015 1929   PROTEINUR 1+ (A) 06/14/2016 1102   PROTEINUR 30 (A) 04/06/2015 1929   UROBILINOGEN negative 05/20/2015 0949   UROBILINOGEN 0.2 04/06/2015 1929   NITRITE Negative 06/14/2016 1102   NITRITE NEGATIVE 04/06/2015 1929   LEUKOCYTESUR Negative 06/14/2016 1102   Sepsis Labs: Invalid input(s): PROCALCITONIN, LACTICIDVEN  No results found for this or any previous visit (from the past 240 hour(s)).    Radiology Studies: Dg Chest 2 View  Result Date: 08/07/2016 CLINICAL DATA:  Dyspnea. Recent history of pneumonia with antibiotic administration. Pitting edema in lower  extremities. EXAM: CHEST  2 VIEW COMPARISON:  None. FINDINGS: Cardiomegaly with aortic atherosclerosis. The thoracic aorta is slightly uncoiled in appearance without aneurysm. Mild interstitial edema is noted with trace posterior pleural effusions. Trace fluid in the right major fissure. No pulmonary consolidation. No pneumothorax. Degenerative changes along the dorsal spine. IMPRESSION: Stable cardiomegaly with aortic atherosclerosis. Mild interstitial pulmonary edema. Trace posterior pleural effusions. Electronically Signed   By: Ashley Royalty M.D.   On: 08/07/2016 22:40     Scheduled Meds: . amiodarone  100 mg Oral Daily  . furosemide  60 mg Intravenous BID  . guaiFENesin  600 mg Oral BID  . insulin aspart  0-9 Units Subcutaneous TID WC  . insulin detemir  7 Units Subcutaneous Daily  . mometasone-formoterol  2 puff Inhalation BID  . NIFEdipine  30 mg Oral Daily  . pantoprazole  40 mg Oral Q breakfast  . potassium chloride SA  20 mEq Oral Daily  . sodium chloride flush  3 mL Intravenous Q12H  . warfarin  6 mg Oral q1800  . Warfarin - Pharmacist Dosing Inpatient   Does not apply q1800   Continuous Infusions:  Marzetta Board, MD, PhD [facility] Pager [phone]  If 7PM-7AM, please contact night-coverage [email or web address] Password [facility] 08/09/2016, 10:57 AM

## 2016-08-10 ENCOUNTER — Ambulatory Visit: Payer: Self-pay | Admitting: Pharmacist

## 2016-08-10 ENCOUNTER — Ambulatory Visit: Payer: Medicare Other | Admitting: Cardiovascular Disease

## 2016-08-10 LAB — GLUCOSE, CAPILLARY
GLUCOSE-CAPILLARY: 110 mg/dL — AB (ref 65–99)
GLUCOSE-CAPILLARY: 132 mg/dL — AB (ref 65–99)
Glucose-Capillary: 186 mg/dL — ABNORMAL HIGH (ref 65–99)
Glucose-Capillary: 79 mg/dL (ref 65–99)

## 2016-08-10 LAB — BASIC METABOLIC PANEL
Anion gap: 10 (ref 5–15)
BUN: 32 mg/dL — AB (ref 6–20)
CALCIUM: 8.8 mg/dL — AB (ref 8.9–10.3)
CHLORIDE: 94 mmol/L — AB (ref 101–111)
CO2: 37 mmol/L — AB (ref 22–32)
CREATININE: 1.42 mg/dL — AB (ref 0.44–1.00)
GFR calc Af Amer: 38 mL/min — ABNORMAL LOW (ref >60)
GFR calc non Af Amer: 32 mL/min — ABNORMAL LOW (ref >60)
GLUCOSE: 124 mg/dL — AB (ref 65–99)
Potassium: 4 mmol/L (ref 3.5–5.1)
Sodium: 141 mmol/L (ref 135–145)

## 2016-08-10 LAB — PROTIME-INR
INR: 2.98
Prothrombin Time: 31.6 seconds — ABNORMAL HIGH (ref 11.4–15.2)

## 2016-08-10 LAB — CBC
HEMATOCRIT: 33.4 % — AB (ref 36.0–46.0)
HEMOGLOBIN: 10 g/dL — AB (ref 12.0–15.0)
MCH: 30.3 pg (ref 26.0–34.0)
MCHC: 29.9 g/dL — AB (ref 30.0–36.0)
MCV: 101.2 fL — AB (ref 78.0–100.0)
Platelets: 172 10*3/uL (ref 150–400)
RBC: 3.3 MIL/uL — ABNORMAL LOW (ref 3.87–5.11)
RDW: 14.2 % (ref 11.5–15.5)
WBC: 5.5 10*3/uL (ref 4.0–10.5)

## 2016-08-10 NOTE — Evaluation (Signed)
Physical Therapy Evaluation Patient Details Name: [name] MRN: [id number] DOB: April 07, 1928 Today's Date: 08/10/2016   History of Present Illness  Pt is an 83 y/o female admitted secondary to SOB, bilateral LE edema and CHF exacerbation. PMH including but not limited to CAD, CHF, COPD (on home O2 PRN), DM, CVA (2014), PAF and L femoral IM nail (2016).  Clinical Impression  Pt presented sitting EOB when PT entered room. Prior to admission, pt reported that she was independent with all ADLs and mod I for all functional mobility with use of rollator to ambulate. Pt currently requires min guard for safety with transfers and ambulation with use of RW. Pt would continue to benefit from skilled physical therapy services at this time while admitted to address her below listed limitations in order to improve her overall safety and independence with functional mobility.       Follow Up Recommendations No PT follow up    Equipment Recommendations  None recommended by PT    Recommendations for Other Services       Precautions / Restrictions Precautions Precautions: Fall Restrictions Weight Bearing Restrictions: No      Mobility  Bed Mobility               General bed mobility comments: pt sitting EOB upon arrival  Transfers Overall transfer level: Needs assistance Equipment used: Rolling walker (2 wheeled) Transfers: Sit to/from Stand Sit to Stand: Min guard         General transfer comment: increased time, min guard for safety  Ambulation/Gait Ambulation/Gait assistance: Min guard Ambulation Distance (Feet): 20 Feet (20' x1, sitting rest break, then 15' x1) Assistive device: Rolling walker (2 wheeled) Gait Pattern/deviations: Step-through pattern;Decreased stride length Gait velocity: decreased Gait velocity interpretation: Below normal speed for age/gender General Gait Details: pt demonstrated safety with use of RW, no instability or LOB  Stairs             Wheelchair Mobility    Modified Rankin (Stroke Patients Only)       Balance Overall balance assessment: Needs assistance Sitting-balance support: Feet supported;No upper extremity supported Sitting balance-Leahy Scale: Good     Standing balance support: During functional activity;Single extremity supported Standing balance-Leahy Scale: Poor Standing balance comment: pt reliant on at least one UE on external support                             Pertinent Vitals/Pain Pain Assessment: No/denies pain    Home Living Family/patient expects to be discharged to:: Private residence Living Arrangements: Alone Available Help at Discharge: Family;Friend(s);Available PRN/intermittently Type of Home: Independent living facility [facility] [employer]) Home Access: Elevator     Home Layout: One level Home Equipment: Environmental consultant - 4 wheels;Cane - single point;Shower seat      Prior Function Level of Independence: Independent with assistive device(s)         Comments: pt reported that she mostly ambulates with use of rollator     Hand Dominance        Extremity/Trunk Assessment   Upper Extremity Assessment Upper Extremity Assessment: Overall WFL for tasks assessed    Lower Extremity Assessment Lower Extremity Assessment: Generalized weakness       Communication   Communication: No difficulties  Cognition Arousal/Alertness: Awake/alert Behavior During Therapy: WFL for tasks assessed/performed Overall Cognitive Status: Within Functional Limits for tasks assessed  General Comments      Exercises     Assessment/Plan    PT Assessment Patient needs continued PT services  PT Problem List Decreased activity tolerance;Decreased balance;Decreased mobility;Decreased coordination;Decreased safety awareness;Cardiopulmonary status limiting activity          PT Treatment Interventions DME instruction;Gait training;Therapeutic  activities;Functional mobility training;Therapeutic exercise;Balance training;Neuromuscular re-education;Patient/family education    PT Goals (Current goals can be found in the Care Plan section)  Acute Rehab PT Goals Patient Stated Goal: return home PT Goal Formulation: With patient Time For Goal Achievement: 08/24/16 Potential to Achieve Goals: Good    Frequency Min 3X/week   Barriers to discharge        Co-evaluation               End of Session Equipment Utilized During Treatment: Gait belt;Oxygen Activity Tolerance: Patient tolerated treatment well Patient left: in chair;with call bell/phone within reach;with chair alarm set Nurse Communication: Mobility status         Time: [id number] PT Time Calculation (min) (ACUTE ONLY): 28 min   Charges:   PT Evaluation $PT Eval Moderate Complexity: 1 Procedure PT Treatments $Gait Training: 8-22 mins   PT G CodesClearnce Sorrel Antonious Omahoney 08/10/2016, 3:44 PM Sherie Don, [place], DPT [phone]

## 2016-08-10 NOTE — Progress Notes (Signed)
ANTICOAGULATION CONSULT NOTE - Follow Up Consult  Pharmacy Consult for Coumadin Indication: atrial fibrillation  Allergies  Allergen Reactions  . Atorvastatin Other (See Comments)     muscle aches  . Codeine Nausea And Vomiting  . Morphine Nausea And Vomiting  . Other Other (See Comments)    OPIATES cause nausea and vomiting  . Rosuvastatin Other (See Comments)    muscle aches at high doses, held as of 12/2010 due to aches  . Sulfa Antibiotics Nausea And Vomiting  . Iohexol Other (See Comments)     Desc: unknown reaction; allergic to iodine and contrast     Patient Measurements: Height: 5' (152.4 cm) Weight: 113 lb 1.6 oz (51.3 kg) IBW/kg (Calculated) : 45.5 Heparin Dosing Weight:   Vital Signs: Temp: 98.1 F (36.7 C) (02/14 1300) Temp Source: Oral (02/14 1300) BP: 120/64 (02/14 1300) Pulse Rate: 55 (02/14 1300)  Labs:  Recent Labs  08/07/16 2138 08/07/16 2158  08/08/16 0358 08/08/16 1115 08/09/16 0534 08/10/16 0401  HGB 10.8* 11.9*  --   --   --  10.6* 10.0*  HCT 35.6* 35.0*  --   --   --  35.3* 33.4*  PLT 213  --   --   --   --  175 172  LABPROT  --   --   < > 28.4*  --  30.5* 31.6*  INR  --   --   < > 2.60  --  2.85 2.98  CREATININE 1.27* 1.50*  --   --   --  1.35* 1.42*  TROPONINI  --   --   --  0.06* 0.05*  --   --   < > = values in this interval not displayed.  Estimated Creatinine Clearance: 20.4 mL/min (by C-G formula based on SCr of 1.42 mg/dL (H)).   Assessment:  Anticoag: coumadin PTA for afib. Admission INR 2.48 (therapeutic). CBC stable.INR 2.98 today. Hgb 10 down slightly - PTA dose 6mg  daily  Goal of Therapy:  INR 2-3 Monitor platelets by anticoagulation protocol: Yes   Plan:  Daily INR Coumadin 6mg  po daily  Josi Roediger S. Alford Highland, PharmD, BCPS Clinical Staff Pharmacist Pager [phone]  [name] 08/10/2016,1:22 PM

## 2016-08-10 NOTE — Progress Notes (Signed)
PROGRESS NOTE    [name]  [id number] DOB: 1927-10-26 DOA: 08/07/2016 PCP: Redge Gainer, MD     Brief Narrative:  [name] is a 83 y.o.femalewith medical history significant of chronic combined CHF, angina, CAD, anxiety, osteoarthritis, paroxysmal atrial fibrillation, carotid artery disease, COPD, GERD, hyperlipidemia, hypertension, mitral valve regurgitation, pulmonary hypertension, CVA, type 2 diabetes who came to the emergency department with a week history of progressively worse dyspnea and lower extremity edema despite treatment with oral furosemide and torsemide.   Assessment & Plan:   Principal Problem:   Acute on chronic systolic congestive heart failure (HCC) Active Problems:   Hyperlipidemia LDL goal <70   PAF, on Amiodarone/Coumadin/metoprolol.   COPD with emphysema (HCC)   Chronic respiratory failure with hypoxia (HCC)   HTN (hypertension)   CAD, remote RCA PCI, subsequently noted to be occluded, last cath 8/11   DM type 2 causing CKD stage 3 (HCC)   Anemia  Acute on chronic combined systolic and diastolic congestive heart failure  - Echo 08/08/16: EF 55-60% with grade 2 diastolic dysfunction and mild pulmonary HTN  - Continue lasix 60mg  IV BID - Daily weights 117 lb --> 113lb  - I/Os   Paroxysmal A Fib - Continue amiodarone and warfarin per pharmacy. - Holding toprol, nifedipine due to bradycardia   Hyperlipidemia - Continue atorvastatin  CKD III - Baseline Cr appears to be 1.2 - Stable, making urine, monitor   COPD with emphysema  - She is on chronic oxygen at home, 2 L nasal cannula, she is being followed by pulmonology as an outpatient. She has no wheezing currently. Stable. Continue dulera   Chronic respiratory failure with hypoxia  - Continue supplemental oxygen  HTN (hypertension) - Holding toprol, nifedipine due to bradycardia, holding ACE due to elevated Cr   CAD, remote RCA PCI, subsequently noted to be occluded, last cath  8/11 - Continue warfarin and atorvastatin.  DM type 2 causing CKD stage 3 - Continue Levemir, sliding scale novolog    DVT prophylaxis: coumadin Code Status: full Family Communication: no family at bedside Disposition Plan: pending further improvement and stabilization    Consultants:   None  Procedures:   None  Antimicrobials:   None     Subjective: Patient's main complaint is not being out of bed yet. She also complains of bilateral leg wounds that she has been managing at home. Breathing seems about the same, no new CP or SOB.    Objective: Vitals:   08/09/16 2100 08/10/16 0556 08/10/16 0633 08/10/16 0918  BP: (!) 114/46  (!) 142/51   Pulse: (!) 58  60   Resp: 20  20   Temp: 97.9 F (36.6 C)  97.9 F (36.6 C)   TempSrc: Oral  Oral   SpO2: 100%  92% 100%  Weight:  51.3 kg (113 lb 1.6 oz)    Height:        Intake/Output Summary (Last 24 hours) at 08/10/16 1257 Last data filed at 08/10/16 0900  Gross per 24 hour  Intake              960 ml  Output              550 ml  Net              410 ml   Filed Weights   08/08/16 0146 08/09/16 0616 08/10/16 0556  Weight: 53.4 kg (117 lb 11.2 oz) 53.3 kg (117 lb 8 oz) 51.3 kg (113 lb  1.6 oz)    Examination:  General exam: Appears calm and comfortable  Respiratory system: Bilateral crackles without wheeze, no respiratory distress  Cardiovascular system: S1 & S2 heard, RRR. No JVD, murmurs, rubs, gallops or clicks. +1 pedal edema. Gastrointestinal system: Abdomen is nondistended, soft and nontender. No organomegaly or masses felt. Normal bowel sounds heard. Central nervous system: Alert and oriented. No focal neurological deficits. Extremities: Symmetric 5 x 5 power. Skin: Bilateral leg wounds are wrapped in ACE bandage, which remains clean and dry  Psychiatry: Judgement and insight appear normal. Mood & affect appropriate.   Data Reviewed: I have personally reviewed following labs and imaging  studies  CBC:  Recent Labs Lab 08/07/16 2138 08/07/16 2158 08/09/16 0534 08/10/16 0401  WBC 5.6  --  4.8 5.5  NEUTROABS 3.8  --   --   --   HGB 10.8* 11.9* 10.6* 10.0*  HCT 35.6* 35.0* 35.3* 33.4*  MCV 100.3*  --  100.0 101.2*  PLT 213  --  175 [id number]   Basic Metabolic Panel:  Recent Labs Lab 08/07/16 2138 08/07/16 2158 08/09/16 0534 08/10/16 0401  NA 141 143 144 141  K 4.1 4.0 4.0 4.0  CL 98* 98* 97* 94*  CO2 36*  --  34* 37*  GLUCOSE 219* 223* 97 124*  BUN 25* 28* 28* 32*  CREATININE 1.27* 1.50* 1.35* 1.42*  CALCIUM 9.5  --  9.1 8.8*   GFR: Estimated Creatinine Clearance: 20.4 mL/min (by C-G formula based on SCr of 1.42 mg/dL (H)). Liver Function Tests:  Recent Labs Lab 08/07/16 2138  AST 28  ALT 43  ALKPHOS 79  BILITOT 0.6  PROT 5.9*  ALBUMIN 3.1*   No results for input(s): LIPASE, AMYLASE in the last 168 hours. No results for input(s): AMMONIA in the last 168 hours. Coagulation Profile:  Recent Labs Lab 08/07/16 2246 08/08/16 0358 08/09/16 0534 08/10/16 0401  INR 2.48 2.60 2.85 2.98   Cardiac Enzymes:  Recent Labs Lab 08/08/16 0358 08/08/16 1115  TROPONINI 0.06* 0.05*   BNP (last 3 results) No results for input(s): PROBNP in the last 8760 hours. HbA1C: No results for input(s): HGBA1C in the last 72 hours. CBG:  Recent Labs Lab 08/09/16 1114 08/09/16 1711 08/09/16 2110 08/10/16 0745 08/10/16 1154  GLUCAP 148* 197* 180* 110* 132*   Lipid Profile: No results for input(s): CHOL, HDL, LDLCALC, TRIG, CHOLHDL, LDLDIRECT in the last 72 hours. Thyroid Function Tests: No results for input(s): TSH, T4TOTAL, FREET4, T3FREE, THYROIDAB in the last 72 hours. Anemia Panel: No results for input(s): VITAMINB12, FOLATE, FERRITIN, TIBC, IRON, RETICCTPCT in the last 72 hours. Sepsis Labs: No results for input(s): PROCALCITON, LATICACIDVEN in the last 168 hours.  No results found for this or any previous visit (from the past 240 hour(s)).      Radiology Studies: No results found.    Scheduled Meds: . amiodarone  100 mg Oral Daily  . atorvastatin  20 mg Oral QHS  . furosemide  60 mg Intravenous BID  . guaiFENesin  600 mg Oral BID  . insulin aspart  0-9 Units Subcutaneous TID WC  . insulin detemir  7 Units Subcutaneous Daily  . mometasone-formoterol  2 puff Inhalation BID  . NIFEdipine  30 mg Oral Daily  . pantoprazole  40 mg Oral Q breakfast  . potassium chloride SA  20 mEq Oral Daily  . sodium chloride flush  3 mL Intravenous Q12H  . warfarin  6 mg Oral q1800  . Warfarin -  Pharmacist Dosing Inpatient   Does not apply q1800   Continuous Infusions:   LOS: 2 days    Time spent: 40 minutes   Dessa Phi, DO [facility] [email or web address] Password [facility] 08/10/2016, 12:57 PM

## 2016-08-10 NOTE — Progress Notes (Addendum)
Pt. With temp of 102.6 this am. On call for [facility] made aware. RN will continue to monitor. Wall, Santiago Glad Cherrell  Pt afebrile. Note entered in error from prior RN.

## 2016-08-10 NOTE — Consult Note (Addendum)
[place] Nurse wound consult note Reason for Consult: Consult requested for bilat legs.  Pt states she develops significant edema if her legs are not wrapped daily.  She previously had "many lesions all over" but these appear to have resolved.  Wound type: Left foot with partial thickness wound; .2X.2X.1cm, yellow moist wound bed, small amt yellow drainage, no odor. No other open wounds to BLE.Generalizd edema and erythremia. Pt did not have ace wrap evenly applied to left leg and it has decreased in size to left ankle and left upper calf, but middle calf remains swollen with mod amt edema and is painful to the touch. Left 2nd-3rd anterior toes with dry yellow scabbed callous, approx .5X.5cm, no open wound, fluctuance, or drainage; no topical treatment indicated. Dressing procedure/placement/frequency: Foam dressing to absorb drainage and promote healing to left foot. Bedside nurse to apply Kerlex and ace wrap to BLE Q day to contain edema. Discussed plan of care with patient and she verbalized understanding. Please re-consult if further assistance is needed.  Thank-you,  Julien Girt MSN, [place], [place], [place], [place]

## 2016-08-11 DIAGNOSIS — I2583 Coronary atherosclerosis due to lipid rich plaque: Secondary | ICD-10-CM

## 2016-08-11 DIAGNOSIS — I480 Paroxysmal atrial fibrillation: Secondary | ICD-10-CM

## 2016-08-11 DIAGNOSIS — I2510 Atherosclerotic heart disease of native coronary artery without angina pectoris: Secondary | ICD-10-CM

## 2016-08-11 DIAGNOSIS — J439 Emphysema, unspecified: Secondary | ICD-10-CM

## 2016-08-11 DIAGNOSIS — I5023 Acute on chronic systolic (congestive) heart failure: Secondary | ICD-10-CM

## 2016-08-11 LAB — GLUCOSE, CAPILLARY
GLUCOSE-CAPILLARY: 155 mg/dL — AB (ref 65–99)
Glucose-Capillary: 85 mg/dL (ref 65–99)
Glucose-Capillary: 118 mg/dL — ABNORMAL HIGH (ref 65–99)
Glucose-Capillary: 99 mg/dL (ref 65–99)

## 2016-08-11 LAB — BASIC METABOLIC PANEL
Anion gap: 8 (ref 5–15)
BUN: 39 mg/dL — AB (ref 6–20)
CALCIUM: 8.9 mg/dL (ref 8.9–10.3)
CO2: 34 mmol/L — ABNORMAL HIGH (ref 22–32)
Chloride: 98 mmol/L — ABNORMAL LOW (ref 101–111)
Creatinine, Ser: 1.45 mg/dL — ABNORMAL HIGH (ref 0.44–1.00)
GFR calc Af Amer: 37 mL/min — ABNORMAL LOW (ref >60)
GFR, EST NON AFRICAN AMERICAN: 32 mL/min — AB (ref >60)
GLUCOSE: 176 mg/dL — AB (ref 65–99)
Potassium: 3.9 mmol/L (ref 3.5–5.1)
Sodium: 140 mmol/L (ref 135–145)

## 2016-08-11 LAB — PROTIME-INR
INR: 2.9
Prothrombin Time: 30.9 seconds — ABNORMAL HIGH (ref 11.4–15.2)

## 2016-08-11 MED ORDER — TRAZODONE HCL 50 MG PO TABS
50.0000 mg | ORAL_TABLET | Freq: Every evening | ORAL | Status: DC | PRN
  Administered 2016-08-11: 50 mg via ORAL
  Filled 2016-08-11: qty 1

## 2016-08-11 MED ORDER — TORSEMIDE 20 MG PO TABS
40.0000 mg | ORAL_TABLET | Freq: Every morning | ORAL | Status: DC
  Administered 2016-08-12: 40 mg via ORAL
  Filled 2016-08-11: qty 2

## 2016-08-11 MED ORDER — TORSEMIDE 20 MG PO TABS
20.0000 mg | ORAL_TABLET | Freq: Every evening | ORAL | Status: DC
  Administered 2016-08-11: 20 mg via ORAL
  Filled 2016-08-11: qty 1

## 2016-08-11 NOTE — Progress Notes (Signed)
Bed alarm in placed.  

## 2016-08-11 NOTE — Progress Notes (Signed)
Patient refused bed alarm. Charge nurse notified.

## 2016-08-11 NOTE — Evaluation (Signed)
Occupational Therapy Evaluation Patient Details Name: [name] MRN: [id number] DOB: Jun 21, 1928 Today's Date: 08/11/2016    History of Present Illness Pt is an 83 y/o female admitted secondary to SOB, bilateral LE edema and CHF exacerbation. PMH including but not limited to CAD, CHF, COPD (on home O2 PRN), DM, CVA (2014), PAF and L femoral IM nail (2016).   Clinical Impression   Pt is at sup level with ADLs and sup - min guard A with ADL mobility. All education completed and no further acute OT indicated at this time. Pt to continue with PT for functional mobility safety.     Follow Up Recommendations  No OT follow up;Supervision - Intermittent    Equipment Recommendations  None recommended by OT    Recommendations for Other Services       Precautions / Restrictions Precautions Precautions: Fall Restrictions Weight Bearing Restrictions: No      Mobility Bed Mobility Overal bed mobility: Needs Assistance Bed Mobility: Supine to Sit;Sit to Supine     Supine to sit: Supervision Sit to supine: Supervision      Transfers Overall transfer level: Needs assistance Equipment used: Rolling walker (2 wheeled) Transfers: Sit to/from Stand Sit to Stand: Supervision;Min guard         General transfer comment: increased time, min guard and cues  for safety    Balance Overall balance assessment: Needs assistance Sitting-balance support: Feet supported;No upper extremity supported       Standing balance support: During functional activity;Single extremity supported Standing balance-Leahy Scale: Fair                              ADL Overall ADL's : Needs assistance/impaired   Eating/Feeding Details (indicate cue type and reason): superivsion with ADL mobility Grooming: Supervision/safety   Upper Body Bathing: Supervision/ safety   Lower Body Bathing: Supervison/ safety   Upper Body Dressing : Supervision/safety   Lower Body Dressing:  Supervision/safety   Toilet Transfer: Supervision/safety;Min guard;Ambulation;RW;Cueing for safety   Toileting- Clothing Manipulation and Hygiene: Supervision/safety   Tub/ Shower Transfer: Min guard   Functional mobility during ADLs: Min guard;Supervision/safety       Vision Vision Assessment?: No apparent visual deficits              Pertinent Vitals/Pain Pain Assessment: No/denies pain     Hand Dominance Right   Extremity/Trunk Assessment Upper Extremity Assessment Upper Extremity Assessment: Overall WFL for tasks assessed   Lower Extremity Assessment Lower Extremity Assessment: Defer to PT evaluation       Communication Communication Communication: No difficulties   Cognition Arousal/Alertness: Awake/alert Behavior During Therapy: WFL for tasks assessed/performed Overall Cognitive Status: No family/caregiver present to determine baseline cognitive functioning                     General Comments   pt very pleasant, cooperative and talkative                 Home Living Family/patient expects to be discharged to:: Private residence Living Arrangements: Alone Available Help at Discharge: Family;Friend(s);Available PRN/intermittently Type of Home: Independent living facility Home Access: Elevator     Home Layout: One level     Bathroom Shower/Tub: Tub/shower unit         Home Equipment: Environmental consultant - 4 wheels;Cane - single point;Shower seat          Prior Functioning/Environment Level of Independence: Independent with assistive device(s)  Comments: pt reported that she mostly ambulates with use of rollator        OT Problem List: Decreased activity tolerance;Impaired balance (sitting and/or standing)   OT Treatment/Interventions:      OT Goals(Current goals can be found in the care plan section) Acute Rehab OT Goals Patient Stated Goal: go home OT Goal Formulation: With patient  OT Frequency:     Barriers to D/C:     no barriiers                     End of Session Equipment Utilized During Treatment: Rolling walker;Oxygen  Activity Tolerance: Patient tolerated treatment well Patient left: in bed;with call bell/phone within reach;with bed alarm set   Time: [id number] OT Time Calculation (min): 26 min Charges:  OT General Charges $OT Visit: 1 Procedure OT Evaluation $OT Eval Low Complexity: 1 Procedure OT Treatments $Self Care/Home Management : 8-22 mins $Therapeutic Activity: 8-22 mins G-Codes:    Britt Bottom 08/11/2016, 2:12 PM

## 2016-08-11 NOTE — Progress Notes (Signed)
ANTICOAGULATION CONSULT NOTE - Follow Up Consult  Pharmacy Consult for Coumadin Indication: atrial fibrillation  Allergies  Allergen Reactions  . Atorvastatin Other (See Comments)     muscle aches  . Codeine Nausea And Vomiting  . Morphine Nausea And Vomiting  . Other Other (See Comments)    OPIATES cause nausea and vomiting  . Rosuvastatin Other (See Comments)    muscle aches at high doses, held as of 12/2010 due to aches  . Sulfa Antibiotics Nausea And Vomiting  . Iohexol Other (See Comments)     Desc: unknown reaction; allergic to iodine and contrast     Patient Measurements: Height: 5' (152.4 cm) Weight: 118 lb 3.2 oz (53.6 kg) (scale c) IBW/kg (Calculated) : 45.5 Heparin Dosing Weight:   Vital Signs: Temp: 98 F (36.7 C) (02/15 1009) Temp Source: Oral (02/15 1009) BP: 123/47 (02/15 1009) Pulse Rate: 54 (02/15 1009)  Labs:  Recent Labs  08/08/16 1115 08/09/16 0534 08/10/16 0401 08/11/16 0424  HGB  --  10.6* 10.0*  --   HCT  --  35.3* 33.4*  --   PLT  --  175 172  --   LABPROT  --  30.5* 31.6* 30.9*  INR  --  2.85 2.98 2.90  CREATININE  --  1.35* 1.42* 1.45*  TROPONINI 0.05*  --   --   --     Estimated Creatinine Clearance: 20 mL/min (by C-G formula based on SCr of 1.45 mg/dL (H)).   Assessment:  Anticoag: coumadin PTA for afib. Admission INR 2.48 (therapeutic). CBC stable.INR 2.9 today. Hgb 10 down slightly - PTA dose 6mg  daily  Goal of Therapy:  INR 2-3 Monitor platelets by anticoagulation protocol: Yes   Plan:  Daily INR Coumadin 6mg  po daily  Markea Ruzich S. Alford Highland, PharmD, [facility] Clinical Staff Pharmacist Pager [phone]  [name] 08/11/2016,10:50 AM

## 2016-08-11 NOTE — Progress Notes (Signed)
Pt calls to nurses station complaining of chest pain. RN to bedside to assess pt. EKG done, pt repositioned, vitals taken. Pt then reported chest pain was gone. Dr. Maylene Roes made aware. Will continue to monitor.

## 2016-08-11 NOTE — Progress Notes (Signed)
PROGRESS NOTE    [name]  [id number] DOB: 06/21/1928 DOA: 08/07/2016 PCP: Redge Gainer, MD     Brief Narrative:  [name] is a 83 y.o.femalewith medical history significant of chronic combined CHF, angina, CAD, anxiety, osteoarthritis, paroxysmal atrial fibrillation, carotid artery disease, COPD, GERD, hyperlipidemia, hypertension, mitral valve regurgitation, pulmonary hypertension, CVA, type 2 diabetes who came to the emergency department with a week history of progressively worse dyspnea and lower extremity edema despite treatment with oral furosemide and torsemide.   Assessment & Plan:   Principal Problem:   Acute on chronic systolic congestive heart failure (HCC) Active Problems:   Hyperlipidemia LDL goal <70   PAF, on Amiodarone/Coumadin/metoprolol.   COPD with emphysema (HCC)   Chronic respiratory failure with hypoxia (HCC)   HTN (hypertension)   CAD, remote RCA PCI, subsequently noted to be occluded, last cath 8/11   DM type 2 causing CKD stage 3 (HCC)   Anemia  Acute on chronic combined systolic and diastolic congestive heart failure  - Echo 08/08/16: EF 55-60% with grade 2 diastolic dysfunction and mild pulmonary HTN  - Continue lasix 60mg  IV BID - Daily weights 117 lb --> 113lb --> 118lb  - I/Os not accurate due to incontinence - Will consult cardiology today as patient not making much progress  Paroxysmal A Fib - Continue amiodarone and warfarin per pharmacy. - Holding toprol, nifedipine due to bradycardia   Hyperlipidemia - Continue atorvastatin  CKD III - Baseline Cr appears to be 1.2 - Stable  COPD with emphysema  - She is on chronic oxygen at home, 2 L nasal cannula, she is being followed by pulmonology as an outpatient. She has no wheezing currently. Stable. Continue dulera   Chronic respiratory failure with hypoxia  - Continue supplemental oxygen  HTN (hypertension) - Holding toprol, nifedipine due to bradycardia, holding ACE  due to elevated Cr   CAD, remote RCA PCI, subsequently noted to be occluded, last cath 8/11 - Continue warfarin and atorvastatin.  DM type 2 causing CKD stage 3 - Continue Levemir, sliding scale novolog    DVT prophylaxis: coumadin Code Status: full Family Communication: daughter at bedside  Disposition Plan: pending further improvement and stabilization    Consultants:   Cardiology   Procedures:   None  Antimicrobials:   None     Subjective: No new complaints today. Daughter states patient's legs are still not back to baseline. Patient is quite upset about getting xanax as she does not have anxiety. Daughter wonders what we can do for sleep at night so patient can get some rest    Objective: Vitals:   08/10/16 2124 08/11/16 0452 08/11/16 1009 08/11/16 1019  BP:  (!) 148/68 (!) 123/47   Pulse:  64 (!) 54   Resp:  16 18   Temp:  98.1 F (36.7 C) 98 F (36.7 C)   TempSrc:  Oral Oral   SpO2: 99% 99% 99% 100%  Weight:  53.6 kg (118 lb 3.2 oz)    Height:        Intake/Output Summary (Last 24 hours) at 08/11/16 1240 Last data filed at 08/11/16 0851  Gross per 24 hour  Intake             1560 ml  Output             1201 ml  Net              359 ml   Filed Weights   08/09/16 0616 08/10/16  [id number] 08/11/16 0452  Weight: 53.3 kg (117 lb 8 oz) 51.3 kg (113 lb 1.6 oz) 53.6 kg (118 lb 3.2 oz)    Examination:  General exam: Appears calm and comfortable  Respiratory system: Bilateral crackles without wheeze, no respiratory distress  Cardiovascular system: S1 & S2 heard, RRR. No JVD, murmurs, rubs, gallops or clicks. +1 pedal edema. Gastrointestinal system: Abdomen is nondistended, soft and nontender. No organomegaly or masses felt. Normal bowel sounds heard. Central nervous system: Alert and oriented. No focal neurological deficits. Extremities: Symmetric 5 x 5 power. Skin: Bilateral leg wounds are wrapped in ACE bandage, which remains clean and dry  Psychiatry:  Judgement and insight appear normal. Mood & affect appropriate.   Data Reviewed: I have personally reviewed following labs and imaging studies  CBC:  Recent Labs Lab 08/07/16 2138 08/07/16 2158 08/09/16 0534 08/10/16 0401  WBC 5.6  --  4.8 5.5  NEUTROABS 3.8  --   --   --   HGB 10.8* 11.9* 10.6* 10.0*  HCT 35.6* 35.0* 35.3* 33.4*  MCV 100.3*  --  100.0 101.2*  PLT 213  --  175 [id number]   Basic Metabolic Panel:  Recent Labs Lab 08/07/16 2138 08/07/16 2158 08/09/16 0534 08/10/16 0401 08/11/16 0424  NA 141 143 144 141 140  K 4.1 4.0 4.0 4.0 3.9  CL 98* 98* 97* 94* 98*  CO2 36*  --  34* 37* 34*  GLUCOSE 219* 223* 97 124* 176*  BUN 25* 28* 28* 32* 39*  CREATININE 1.27* 1.50* 1.35* 1.42* 1.45*  CALCIUM 9.5  --  9.1 8.8* 8.9   GFR: Estimated Creatinine Clearance: 20 mL/min (by C-G formula based on SCr of 1.45 mg/dL (H)). Liver Function Tests:  Recent Labs Lab 08/07/16 2138  AST 28  ALT 43  ALKPHOS 79  BILITOT 0.6  PROT 5.9*  ALBUMIN 3.1*   No results for input(s): LIPASE, AMYLASE in the last 168 hours. No results for input(s): AMMONIA in the last 168 hours. Coagulation Profile:  Recent Labs Lab 08/07/16 2246 08/08/16 0358 08/09/16 0534 08/10/16 0401 08/11/16 0424  INR 2.48 2.60 2.85 2.98 2.90   Cardiac Enzymes:  Recent Labs Lab 08/08/16 0358 08/08/16 1115  TROPONINI 0.06* 0.05*   BNP (last 3 results) No results for input(s): PROBNP in the last 8760 hours. HbA1C: No results for input(s): HGBA1C in the last 72 hours. CBG:  Recent Labs Lab 08/10/16 1154 08/10/16 1623 08/10/16 2054 08/11/16 0740 08/11/16 1132  GLUCAP 132* 186* 79 85 118*   Lipid Profile: No results for input(s): CHOL, HDL, LDLCALC, TRIG, CHOLHDL, LDLDIRECT in the last 72 hours. Thyroid Function Tests: No results for input(s): TSH, T4TOTAL, FREET4, T3FREE, THYROIDAB in the last 72 hours. Anemia Panel: No results for input(s): VITAMINB12, FOLATE, FERRITIN, TIBC, IRON,  RETICCTPCT in the last 72 hours. Sepsis Labs: No results for input(s): PROCALCITON, LATICACIDVEN in the last 168 hours.  No results found for this or any previous visit (from the past 240 hour(s)).     Radiology Studies: No results found.    Scheduled Meds: . amiodarone  100 mg Oral Daily  . atorvastatin  20 mg Oral QHS  . furosemide  60 mg Intravenous BID  . guaiFENesin  600 mg Oral BID  . insulin aspart  0-9 Units Subcutaneous TID WC  . insulin detemir  7 Units Subcutaneous Daily  . mometasone-formoterol  2 puff Inhalation BID  . NIFEdipine  30 mg Oral Daily  . pantoprazole  40 mg Oral  Q breakfast  . potassium chloride SA  20 mEq Oral Daily  . sodium chloride flush  3 mL Intravenous Q12H  . warfarin  6 mg Oral q1800  . Warfarin - Pharmacist Dosing Inpatient   Does not apply q1800   Continuous Infusions:   LOS: 3 days    Time spent: 30 minutes   Dessa Phi, DO [facility] [email or web address] Password [facility] 08/11/2016, 12:40 PM

## 2016-08-11 NOTE — Progress Notes (Signed)
Pt AOx4. Pt instructed of high risk fall potential while hospitalized. Pt refused bed alarm precaution and wishes to "get up ad lib" with risk. Will continue to utilize other fall risk precautions with pt.   Tayia Stonesifer RN 

## 2016-08-11 NOTE — Consult Note (Signed)
CARDIOLOGY CONSULT NOTE   Patient ID: [name] MRN: [id number] DOB/AGE: 81-Jan-1929 81 y.o.  Admit date: 08/07/2016  Requesting Physician: Dr. Gala Lewandowsky Primary Physician:   Melinda Gainer, MD Primary Cardiologist:   Dr. Quay Burow  Reason for Consultation:   Heart Failure  HPI: [name] is a 83 y.o. female with a history of  End stage COPD chronically on 2-5L [place], hx of CHF TTE 08/23/11 = normal EF, recently treated for community acquired pneumonia,  hx of paroxysmal atrial fibrillation, history of Myocardial infarction, Type II diabetes: A1c is 8, stroke in 2014, CAD.  Coronary artery disease: Cath in 2006 showed an occluded RCA with left-to-right collaterals & left main, large vessel with no significant disease, diffuse 40-50% mid and distal LAD disease, first diagonal a small vessel which bifurcates at its distal segment with 80% ostial lesion, left circumflex noted to be diffusely calcified in proximal and mid segment with up to 30% irregularities, the second OM was a medium sized vessel with diffuse 30-40% disease, EF=25-30% at that time. EF subsequently improved to normal by 2D ECHO. Cath Aug. 2011 showed unchanged anatomy. > medical therapy recommended.  Patient presented to the Emergency Department on 08/07/2016 complaining of bilateral lower extremity swelling with weeping, 2-4 lb weight gain in two days, shortness of breath worse than her baseline.   Past Medical History:  Diagnosis Date  . Angina   . Anxiety   . Arthritis    "all over"  . Bleeds easily ([place])   . Bradycardia 08/24/2011  . CAD (coronary artery disease)    Cath in 2006 showed an occluded RCA with left-to-right collaterals & otherwise noncritical CAD with EF=25-30% at that time. EF subsequently improved to normal by 2D ECHO. Cath Aug. 2011 showed unchanged anatomy. > medical therapy recommended  . Carotid artery disease (HCC)    Moderate, right greater than left which we following by duplex  ultrasound. [place] 12/05/11 = RIght Bulb/Prox ICA: Moderate to severe amt of fibrous plaque elevating velocities w/in prox segment of ICA. Consistent w/a 50-69% diameter reduction. Left Bulb/Prox ICA: Moderate amt of fibrous plaque slightly elevating velocities w/in the prox segment of the ICA. Consistent w/a 0-49% diameter reduction.   . Carotid bruit   . CHF (congestive heart failure) ([place])    Hospitalized 08/22/11-08/26/11 with CHF secondary to diastolic dysfunction & hospitilized in 07/2012 with a similar problem. TTE 08/23/11 = normal EF.  [name] Chronic lower back pain   . COPD (chronic obstructive pulmonary disease) (HCC)    GOLD 4 - PFT 06/08/10 FEV1 0.93 (62%), FEV1% 60, TLC 2.84 (69%0, DLCO 54%, +BD) On home O2.  [name] COPD with emphysema ([place])   . Coronary atherosclerosis of unspecified type of vessel, native or graft   . Diastolic dysfunction    Grade II  . Edema   . Fall during current hospitalization    "was at [facility]; fell; transferred to [facility]" (01/13/2015)  . Family history of adverse reaction to anesthesia    "daughter gets PONV"  . GERD (gastroesophageal reflux disease)   . History of stress test 07/02/09   Mild perfusion defect seen in the Basal Inferior region(s). This is consistent with an infarct/scar.  Post-stress EF=56%. Global LV systolic function is normal.  EKG is negative for ischemia.  No significant iscemia detected. Low risk scan.  . Hyperlipidemia   . Hypertension   . Hypoxemic respiratory failure, chronic (HCC)    uses 2.5 liters of oxygen with  sleep  . Lower extremity edema    ECHO 07/21/12 = EF 55-60%, performed for TIA. Responding to diurectics. Continue diuresis w/ a goal dry weight of <160lb. Venous Duplex 07/27/11 = Right lower extremity: no evidence of thrombus or thrombophlebitis. Essentially normal right lower extremity venous duplex Doppler evaluation.  . Mitral valve regurgitation    Mild to moderate by ECHO 07/21/12  . Myocardial infarction    "she's had several"  (01/13/2015)  . On home oxygen therapy    "?L; prn" (01/13/2015)  . PAF (paroxysmal atrial fibrillation) (HCC)    In the past, on coumadin  . Pneumonia ~ 2013  . PONV (postoperative nausea and vomiting)   . RBBB (right bundle branch block)    Chronic  . Secondary pulmonary hypertension   . Shortness of breath   . Sleep apnea   . Sleep-related hypoventilation   . Stroke [facility])    "/CT scan; ~ 2014; didn't even know she'd had it"  (01/13/2015)  . Type II diabetes mellitus (HCC)    goal A1C is 8, to avoid hypoglycemia     Past Surgical History:  Procedure Laterality Date  . CARDIAC CATHETERIZATION  2006 & 2011   Cath in 2006 showed an occluded RCA with left-to-right collaterals & otherwise noncritical CAD with EF=25-30% at that time. EF subsequently improved to normal by 2D ECHO. Cath Aug. 2011 showed unchanged anatomy. > medical therapy recommended.  . Carotid Duplex  12/05/11   Moderate, right greater than left which we following by duplex ultrasound. [place] 12/05/11 = RIght Bulb/Prox ICA: Moderate to severe amt of fibrous plaque elevating velocities w/in prox segment of ICA. Consistent w/a 50-69% diameter reduction. Left Bulb/Prox ICA: Moderate amt of fibrous plaque slightly elevating velocities w/in the prox segment of the ICA. Consistent w/a 0-49% diameter reduction.   [name] CATARACT EXTRACTION W/ INTRAOCULAR LENS  IMPLANT, BILATERAL Bilateral   . CORONARY ANGIOPLASTY    . CORONARY ANGIOPLASTY WITH STENT PLACEMENT     x2  . FEMUR IM NAIL Left 01/15/2015   Procedure: INTRAMEDULLARY (IM) NAIL FEMORAL LEFT ;  Surgeon: Netta Cedars, MD;  Location: [place];  Service: Orthopedics;  Laterality: Left;  . SHOULDER OPEN ROTATOR CUFF REPAIR Right 07/2005  . TONSILLECTOMY    . TOTAL ABDOMINAL HYSTERECTOMY  ~ 1967  . TRANSVAGINAL TAPE (TVT) REMOVAL    . Venous Duplex  07/27/11   Venous Duplex 07/27/11 = Right lower extremity: no evidence of thrombus or thrombophlebitis. Essentially normal right lower extremity  venous duplex Doppler evaluation.    Allergies  Allergen Reactions  . Atorvastatin Other (See Comments)     muscle aches  . Codeine Nausea And Vomiting  . Morphine Nausea And Vomiting  . Other Other (See Comments)    OPIATES cause nausea and vomiting  . Rosuvastatin Other (See Comments)    muscle aches at high doses, held as of 12/2010 due to aches  . Sulfa Antibiotics Nausea And Vomiting  . Iohexol Other (See Comments)     Desc: unknown reaction; allergic to iodine and contrast     I have reviewed the patient's current medications . amiodarone  100 mg Oral Daily  . atorvastatin  20 mg Oral QHS  . furosemide  60 mg Intravenous BID  . guaiFENesin  600 mg Oral BID  . insulin aspart  0-9 Units Subcutaneous TID WC  . insulin detemir  7 Units Subcutaneous Daily  . mometasone-formoterol  2 puff Inhalation BID  . NIFEdipine  30 mg  Oral Daily  . pantoprazole  40 mg Oral Q breakfast  . potassium chloride SA  20 mEq Oral Daily  . sodium chloride flush  3 mL Intravenous Q12H  . warfarin  6 mg Oral q1800  . Warfarin - Pharmacist Dosing Inpatient   Does not apply q1800    acetaminophen, azelastine, docusate sodium, fluticasone, hydrocortisone, ipratropium-albuterol, MUSCLE RUB, nitroGLYCERIN, polyethylene glycol, simethicone, traZODone  Prior to Admission medications   Medication Sig Start Date End Date Taking? Authorizing Provider  amiodarone (PACERONE) 200 MG tablet TAKE (1/2) TABLET DAILY. 07/19/16  Yes Chesley Mires, MD  aspirin EC 81 MG tablet Take 81 mg by mouth daily with breakfast.    Yes Historical Provider, MD  atorvastatin (LIPITOR) 20 MG tablet TAKE 1 TABLET DAILY IN THE EVENING Patient taking differently: TAKE 20 mg by mouth at bedtime 05/04/16  Yes Chipper Herb, MD  baclofen (LIORESAL) 10 MG tablet TAKE 1/2 TABLET EVERY 8 HOURS AS NEEDED FOR MUSCLE SPASM 07/04/16  Yes Timmothy Euler, MD  ferrous sulfate 325 (65 FE) MG tablet TAKE 1 TABLET ONCE DAILY WITH BREAKFAST 02/22/16   Yes Chipper Herb, MD  GuaiFENesin (CHEST CONGESTION RELIEF PO) Take 1-1.5 tablets by mouth 2 (two) times daily.   Yes Historical Provider, MD  LEVEMIR FLEXTOUCH 100 UNIT/ML Pen INJECT UP TO 20 UNITS SQ EVERY MORNING AS DIRECTED Patient taking differently: INJECT 8 units once a day each morning 05/04/16  Yes Chipper Herb, MD  metoprolol succinate (TOPROL-XL) 25 MG 24 hr tablet TAKE (1/2) TABLET DAILY. Patient taking differently: Take 12.5mg  by mouth in the morning 05/04/16  Yes Chipper Herb, MD  Multiple Vitamins-Minerals (CENTRUM ADULTS) TABS Take 1 tablet by mouth daily with breakfast.   Yes Historical Provider, MD  Multiple Vitamins-Minerals (PRESERVISION AREDS) CAPS Take 1 capsule by mouth daily with breakfast.   Yes Historical Provider, MD  NIFEdipine (PROCARDIA-XL/ADALAT-CC/NIFEDICAL-XL) 30 MG 24 hr tablet TAKE 1 TABLET ONCE DAILY 07/19/16  Yes Chesley Mires, MD  nitroGLYCERIN (NITROSTAT) 0.4 MG SL tablet Place 1 tablet (0.4 mg total) under the tongue every 5 (five) minutes as needed for chest pain. 05/09/14  Yes Mary-Margaret Hassell Done, FNP  pantoprazole (PROTONIX) 40 MG tablet Take 1 tablet (40 mg total) by mouth daily. Patient taking differently: Take 40 mg by mouth daily with breakfast.  08/12/15  Yes Chipper Herb, MD  potassium chloride SA (K-DUR,KLOR-CON) 20 MEQ tablet TAKE 1 TABLET DAILY 07/20/16  Yes Chipper Herb, MD  torsemide (DEMADEX) 20 MG tablet TAKE  (1)  TABLET TWICE A DAY. 06/17/16  Yes Timmothy Euler, MD  warfarin (COUMADIN) 4 MG tablet Take 1.5 tablets (6 mg total) by mouth daily. 07/25/16  Yes Cherre Robins, PharmD  ACCU-CHEK AVIVA PLUS test strip Test tid. Dx E11.9 Patient not taking: Reported on 08/08/2016 11/27/15   Chipper Herb, MD  acetaminophen (TYLENOL) 500 MG tablet Take 500 mg by mouth every 4 (four) hours as needed for mild pain.     Historical Provider, MD  ALPRAZolam (XANAX) 0.25 MG tablet TAKE 1/2 TO 1 TABLET 2 TIMES A DAY AS NEEDED FOR ANXIETY Patient not  taking: Reported on 08/08/2016 07/03/15   Sharion Balloon, FNP  Azelastine HCl 0.15 % SOLN Place 1 spray into both nostrils 2 (two) times daily as needed (congestion).    Historical Provider, MD  budesonide-formoterol (SYMBICORT) 160-4.5 MCG/ACT inhaler Inhale 1 puff into the lungs 2 (two) times daily. Patient taking differently: Inhale 1 puff into  the lungs 2 (two) times daily as needed (wheezing).  01/24/14   Chipper Herb, MD  cholecalciferol (VITAMIN D) 1000 units tablet Take 1,000 Units by mouth daily.    Historical Provider, MD  docusate sodium (COLACE) 100 MG capsule Take 100 mg by mouth daily as needed for moderate constipation.     Historical Provider, MD  fluticasone (FLONASE) 50 MCG/ACT nasal spray Place 1 spray into both nostrils 2 (two) times daily. Patient taking differently: Place 1 spray into both nostrils as needed.  12/24/13   Chesley Mires, MD  glucosamine-chondroitin 500-400 MG tablet Take 1 tablet by mouth 2 (two) times daily.    Historical Provider, MD  HUMALOG KWIKPEN 100 UNIT/ML KiwkPen INJECT UP TO 8 UNITS BEFORE MEALS AS DIRECTED BY SLIDING SCALE. MAX 24 UNITS PER DAY Patient not taking: Reported on 08/08/2016 04/07/15   Fransisca Kaufmann Dettinger, MD  hydrocortisone (ANUSOL-HC) 25 MG suppository Place 1 suppository (25 mg total) rectally 2 (two) times daily as needed for hemorrhoids or itching. 07/30/15   Fransisca Kaufmann Dettinger, MD  Insulin Pen Needle (PEN NEEDLES) 31G X 6 MM MISC Use to administer insulin up to qid Patient not taking: Reported on 08/08/2016 05/25/15   Chipper Herb, MD  Ipratropium-Albuterol (COMBIVENT) 20-100 MCG/ACT AERS respimat Inhale 1 puff into the lungs every 6 (six) hours as needed for wheezing or shortness of breath. 01/24/14   Chipper Herb, MD  ondansetron (ZOFRAN ODT) 4 MG disintegrating tablet Take 1 tablet (4 mg total) by mouth every 8 (eight) hours as needed for nausea or vomiting. 05/08/16   Fredia Sorrow, MD  OXYGEN Inhale 2.5 L into the lungs daily.     Historical Provider, MD  polyethylene glycol (MIRALAX / GLYCOLAX) packet Take 17 g by mouth 2 (two) times daily as needed for moderate constipation. Patient not taking: Reported on 08/08/2016 05/17/16   Virgel Manifold, MD  polyethylene glycol powder (GLYCOLAX/MIRALAX) powder Take 17 g by mouth daily as needed. Patient taking differently: Take 17 g by mouth daily as needed for mild constipation.  07/30/15   Fransisca Kaufmann Dettinger, MD  PRODIGY TWIST TOP LANCETS 28G MISC CHECK BLOOD SUGAR UP TO 4 TIMES A DAY Patient not taking: Reported on 08/08/2016 05/23/16   Chipper Herb, MD  simethicone (MYLICON) 80 MG chewable tablet Chew 80 mg by mouth at bedtime as needed for flatulence.    Historical Provider, MD  traMADol (ULTRAM) 50 MG tablet Take 1 tablet (50 mg total) by mouth every 6 (six) hours as needed for severe pain. Patient not taking: Reported on 08/08/2016 12/26/15   Julianne Rice, MD  vitamin C (ASCORBIC ACID) 500 MG tablet Take 500 mg by mouth at bedtime.     Historical Provider, MD     Social History   Social History  . Marital status: Widowed    Spouse name: N/A  . Number of children: N/A  . Years of education: N/A   Occupational History  . Not on file.   Social History Main Topics  . Smoking status: Former Smoker    Packs/day: 1.50    Years: 15.00    Types: Cigarettes  . Smokeless tobacco: Never Used     Comment: Smoked 1.5 packs per day for 15 years, quit in 1995.  [name] Alcohol use 3.6 oz/week    6 Glasses of wine per week     Comment: 01/13/2015 "couple glasses of wine maybe 3 days/wk"  . Drug use: No  . Sexual activity:  No   Other Topics Concern  . Not on file   Social History Narrative   Widowed, lives with daughter    Family Status  Relation Status  . Mother Deceased at age 45  . Father Deceased at age 24  . Brother Deceased at age 20  . Brother Deceased at age 20  . Sister Deceased at age 96.5 years   Family History  Problem Relation Age of Onset  . Cancer Mother       Skin  . Heart disease Mother   . Emphysema Father   . Heart disease Father   . Congestive Heart Failure Father   . Heart disease Brother   . Heart disease Brother   . Early death Sister       ROS:  Full 14 point review of systems complete and found to be negative unless listed above.  Physical Exam: Blood pressure (!) 123/47, pulse (!) 54, temperature 98 F (36.7 C), temperature source Oral, resp. rate 18, height 5' (1.524 m), weight 118 lb 3.2 oz (53.6 kg), SpO2 100 %.  General: Well developed, well nourished, female in no acute distress Head: Eyes PERRLA, No xanthomas.   Normocephalic and atraumatic, oropharynx without edema or exudate. Dentition:  Lungs: Regular effort, clear to ascultation bilaterally. Heart: HRRR S1 S2, no rub/gallop, Heart irregular rate and rhythm with S1, S2  murmur. pulses are 2+ extrem.   Neck: No carotid bruits. No lymphadenopathy. Abdomen: Bowel sounds present, abdomen soft and non-tender without masses or hernias noted. Msk:  No spine or cva tenderness. No weakness, no joint deformities or effusions. Extremities: No clubbing or cyanosis.  edema.  Neuro: Alert and oriented X 3. No focal deficits noted. Psych:  Good affect, responds appropriately. Skin: No rashes or lesions noted.  Labs:   Lab Results  Component Value Date   WBC 5.5 08/10/2016   HGB 10.0 (L) 08/10/2016   HCT 33.4 (L) 08/10/2016   MCV 101.2 (H) 08/10/2016   PLT 172 08/10/2016    Recent Labs  08/11/16 0424  INR 2.90    Recent Labs Lab 08/07/16 2138  08/11/16 0424  NA 141  < > 140  K 4.1  < > 3.9  CL 98*  < > 98*  CO2 36*  < > 34*  BUN 25*  < > 39*  CREATININE 1.27*  < > 1.45*  CALCIUM 9.5  < > 8.9  PROT 5.9*  --   --   BILITOT 0.6  --   --   ALKPHOS 79  --   --   ALT 43  --   --   AST 28  --   --   GLUCOSE 219*  < > 176*  ALBUMIN 3.1*  --   --   < > = values in this interval not displayed.  Lab Results  Component Value Date   CHOL 170 07/20/2016   HDL 86  07/20/2016   LDLCALC 71 07/20/2016   TRIG 66 07/20/2016    Echo:  Transthoracic Echocardiography 08/08/2016  Study Conclusions  - Left ventricle: The cavity size was normal. There was moderate   concentric hypertrophy. Systolic function was normal. The   estimated ejection fraction was in the range of 55% to 60%. Wall   motion was normal; there were no regional wall motion   abnormalities. There was a reduced contribution of atrial   contraction to ventricular filling, due to increased ventricular   diastolic pressure or atrial contractile dysfunction. Doppler  parameters are consistent with a reversible restrictive pattern,   indicative of decreased left ventricular diastolic compliance   and/or increased left atrial pressure (grade 3 diastolic   dysfunction). Doppler parameters are consistent with both   elevated ventricular end-diastolic filling pressure and elevated   left atrial filling pressure. - Aortic valve: Valve mobility was restricted. - Mitral valve: Calcified annulus. Mildly thickened, mildly   calcified leaflets . There was mild regurgitation. - Left atrium: The atrium was moderately dilated. - Right atrium: The atrium was moderately dilated. - Pulmonary arteries: PA peak pressure: 38 mm Hg (S).  Impressions:  - The right ventricular systolic pressure was increased consistent   with mild pulmonary hypertension.  ECG: sinus bradycardia- 54 HR, and right bundle branch block. No significant changes from previous EKG's.  Radiology:  No results found.  ASSESSMENT AND PLAN:    Principal Problem:   Acute on chronic systolic congestive heart failure (HCC) Active Problems:   Hyperlipidemia LDL goal <70   PAF, on Amiodarone/Coumadin/metoprolol.   COPD with emphysema ([place])   Chronic respiratory failure with hypoxia (HCC)   HTN (hypertension)   CAD, remote RCA PCI, subsequently noted to be occluded, last cath 8/11   DM type 2 causing CKD stage 3 (HCC)    Anemia  1. Acute on chronic systolic congestive heart failure: Transthoracic echocardiogram done on 08/08/16 with impression of right ventricular systolic pressure increased consistent with mild pulmonary hypertension. - Resume Torsemide, increase to 40 mg  In the AM and 20 mg in the PM. - Discontinue IV Lasix. - Daily weights 118  - Will need Nitroglycerin tablet refills, pt out of medication.  2. Hyperlipidemia: LDL is 71 on Lipitor  3. Paroxysmal atrial fibrillation: On Amiodarone and [place]. Toprol, Nifedipine withheld due to bradycardia. - Resume Nifedipine as an outpatient if she has increase in blood pressure or chest pain. - Discontinue Toprol due to bradycardia  4. COPD with emphysema: internal medicine to manage.  5. Hypertension: ACE withheld due to elevated creatinine at 1.45, baseline is around 1.25.   - Resume ACE when renal function is stable.  6. CAD: Remote RCA PCI, last cath 01/2010.  7. Diabetes Type II: Internal medicine to follow  Patient will need close follow-up as an outpatient with Dr. Gwenlyn Found, [facility] [place].  Signed: Linus Mako, PA-C 08/11/2016 12:42 PM  Co-Sign MD Patient seen and examined and history reviewed. Agree with above findings and plan. 83 yo WF with history of diastolic CHF, PAfib presented to the hospital with increased dyspnea and edema. She has received IV lasix with significant improvement in edema and dyspnea. Weight hasn't changed much but I/O negative 1 liter.  On exam she is a pleasant elderly WF in NAD Lungs clear RRR without gallop or murmur. Legs with shriveled skin and trace edema.  Echo shows normal LV systolic function with grade 3 diastolic dysfunction  This patient's CHF has responded well to IV lasix. She appears dry now. Recommend stopping IV lasix and resume torsemide at higher dose of 40 mg am and 20 mg pm.  Toprol XL held due to bradycardia. Nifedipine also held. If patient has any angina or BP elevation I would resume  nifedipine.  Continue amiodarone and Warfarin for Afib Patient will need follow up with Dr. Gwenlyn Found post DC.  Kelbi Renstrom [place], [place] 08/11/2016 2:14 PM

## 2016-08-12 LAB — PROTIME-INR
INR: 2.45
Prothrombin Time: 27 seconds — ABNORMAL HIGH (ref 11.4–15.2)

## 2016-08-12 LAB — BASIC METABOLIC PANEL
Anion gap: 11 (ref 5–15)
BUN: 39 mg/dL — AB (ref 6–20)
CO2: 33 mmol/L — AB (ref 22–32)
CREATININE: 1.46 mg/dL — AB (ref 0.44–1.00)
Calcium: 9.1 mg/dL (ref 8.9–10.3)
Chloride: 98 mmol/L — ABNORMAL LOW (ref 101–111)
GFR calc non Af Amer: 31 mL/min — ABNORMAL LOW (ref >60)
GFR, EST AFRICAN AMERICAN: 36 mL/min — AB (ref >60)
Glucose, Bld: 86 mg/dL (ref 65–99)
Potassium: 4.3 mmol/L (ref 3.5–5.1)
SODIUM: 142 mmol/L (ref 135–145)

## 2016-08-12 LAB — GLUCOSE, CAPILLARY: Glucose-Capillary: 89 mg/dL (ref 65–99)

## 2016-08-12 MED ORDER — NITROGLYCERIN 0.4 MG SL SUBL: 0.4000 mg | SUBLINGUAL_TABLET | SUBLINGUAL | 0 refills | Status: AC | PRN

## 2016-08-12 MED ORDER — INSULIN DETEMIR 100 UNIT/ML FLEXPEN: PEN_INJECTOR | SUBCUTANEOUS | 6 refills | Status: AC

## 2016-08-12 MED ORDER — TORSEMIDE 20 MG PO TABS: ORAL_TABLET | ORAL | 0 refills | Status: DC

## 2016-08-12 NOTE — Progress Notes (Signed)
Patient unable to follow commands and refusing the cardiac monitor.Patient stable. No signs and symptoms of distress noted. MD notified.

## 2016-08-12 NOTE — Progress Notes (Signed)
Pt has orders to be discharged. Discharge instructions given and pt has no additional questions at this time. Medication regimen reviewed and pt educated. Pt verbalized understanding and has no additional questions. Telemetry box removed. IV removed and site in good condition. Pt stable and waiting for transportation.   Hamp Moreland RN 

## 2016-08-12 NOTE — Discharge Summary (Signed)
Physician Discharge Summary  Clytie Staniszewski [id number] DOB: 1928-01-11 DOA: 08/07/2016  PCP: Redge Gainer, MD  Admit date: 08/07/2016 Discharge date: 08/12/2016  Admitted From: Home Disposition:  Home  Recommendations for Outpatient Follow-up:  1. Follow up with PCP in 1 week 2. Follow up with Cardiology Dr. Gwenlyn Found in 1 week  3. Please obtain BMP/CBC in 1 week  4. Please follow up with coumadin clinic as scheduled   Home Health: No  Equipment/Devices: None   Discharge Condition: Stable CODE STATUS: Full  Diet recommendation:  Heart healthy   Brief/Interim Summary: From H&P: Nashika Mussen is a 83 y.o.femalewith medical history significant of chronic combined CHF, angina, CAD, anxiety, osteoarthritis, paroxysmal atrial fibrillation, carotid artery disease, COPD, GERD, hyperlipidemia, hypertension, mitral valve regurgitation, pulmonary hypertension, CVA, type 2 diabetes who came to the emergency department with a week history of progressively worse dyspnea and lower extremity edema despite treatment with oral furosemide and torsemide.   Interim: Echocardiogram was obtained which showed grade 3 diastolic dysfunction with preserved Ef. She was initially treated with IV lasix 60mg  BID. Her weight continued to fluctuate, I/Os were not accurately measured. Clinically she did not improve much so Cardiology was consulted. They have changed her medication to increase torsemide. Additionally, nifedipine and toprol were held due to bradycardia. She remained on home [place] O2 levels.   Subjective on day of discharge: Feeling well, ready to go home. She has no complaints of chest pain, shortness of breath, nausea.   Discharge Diagnoses:  Principal Problem:   Acute on chronic systolic congestive heart failure (HCC) Active Problems:   Hyperlipidemia LDL goal <70   PAF, on Amiodarone/Coumadin/metoprolol.   COPD with emphysema (HCC)   Chronic respiratory failure with hypoxia (HCC)   HTN  (hypertension)   CAD, remote RCA PCI, subsequently noted to be occluded, last cath 8/11   DM type 2 causing CKD stage 3 (HCC)   Anemia  Acute on chroniccombinedsystolic and diastolic congestive heart failure  - Echo 08/08/16: EF 55-60% with grade 2 diastolic dysfunction and mild pulmonary HTN  - Daily weights 117 lb --> 113lb --> 118lb --> 117lb  - I/Os not accurate due to incontinence - Cardiology consulted, Resume torsemide at higher dose of 40 mg am and 20 mg pm. Toprol XL held due to bradycardia. Nifedipine also held. If patient has any angina or BP elevation, resume nifedipine. Resume ACE when renal function is stable. Follow up with Dr. Gwenlyn Found in office.   Paroxysmal A Fib - Continue amiodarone and warfarin per pharmacy. Follow up with coumadin clinic.  - Holding toprol, nifedipine due to bradycardia   Hyperlipidemia - Continue atorvastatin  CKD III - Baseline Cr appears to be 1.2 - Stable  COPD with emphysema  - She is on chronic oxygen at home, 2 L nasal cannula, she is being followed by pulmonology as an outpatient. She has no wheezing currently. Stable. Continue dulera   Chronic respiratory failure with hypoxia  - Continue supplemental oxygen  HTN (hypertension) - Holding toprol, nifedipine due to bradycardia, holding ACE due to elevated Cr   CAD, remote RCA PCI, subsequently noted to be occluded, last cath 8/11 - Continue warfarin and atorvastatin.  DM type 2 causing CKD stage 3 - Continue Levemir, sliding scale novolog    Discharge Instructions  Discharge Instructions    (HEART FAILURE PATIENTS) Call MD:  Anytime you have any of the following symptoms: 1) 3 pound weight gain in 24 hours or 5 pounds in 1 week 2)  shortness of breath, with or without a dry hacking cough 3) swelling in the hands, feet or stomach 4) if you have to sleep on extra pillows at night in order to breathe.    Complete by:  As directed    AMB Referral to [facility]     Complete by:  As directed    Reason for consult:  HF follow up; admissions and ED visits   Diagnoses of:   Heart Failure COPD/ Pneumonia Diabetes     Expected date of contact:  1-3 days (reserved for hospital discharges)   Please assign to community nurse for transition of care calls and assess for home visits. Questions please call:   Natividad Brood, RN BSN [facility] Liaison  [phone] business mobile phone Toll free office [phone]   Diet - low sodium heart healthy    Complete by:  As directed    Increase activity slowly    Complete by:  As directed      Allergies as of 08/12/2016      Reactions   Atorvastatin Other (See Comments)    muscle aches   Codeine Nausea And Vomiting   Morphine Nausea And Vomiting   Other Other (See Comments)   OPIATES cause nausea and vomiting   Rosuvastatin Other (See Comments)   muscle aches at high doses, held as of 12/2010 due to aches   Sulfa Antibiotics Nausea And Vomiting   Iohexol Other (See Comments)    Desc: unknown reaction; allergic to iodine and contrast      Medication List    STOP taking these medications   ALPRAZolam 0.25 MG tablet Commonly known as:  XANAX   HUMALOG KWIKPEN 100 UNIT/ML KiwkPen Generic drug:  insulin lispro   metoprolol succinate 25 MG 24 hr tablet Commonly known as:  TOPROL-XL   NIFEdipine 30 MG 24 hr tablet Commonly known as:  PROCARDIA-XL/ADALAT-CC/NIFEDICAL-XL     TAKE these medications   ACCU-CHEK AVIVA PLUS test strip Generic drug:  glucose blood Test tid. Dx E11.9   acetaminophen 500 MG tablet Commonly known as:  TYLENOL Take 500 mg by mouth every 4 (four) hours as needed for mild pain.   amiodarone 200 MG tablet Commonly known as:  PACERONE TAKE (1/2) TABLET DAILY.   aspirin EC 81 MG tablet Take 81 mg by mouth daily with breakfast.   atorvastatin 20 MG tablet Commonly known as:  LIPITOR TAKE 1 TABLET DAILY IN THE EVENING What changed:  See the new  instructions.   Azelastine HCl 0.15 % Soln Place 1 spray into both nostrils 2 (two) times daily as needed (congestion).   baclofen 10 MG tablet Commonly known as:  LIORESAL TAKE 1/2 TABLET EVERY 8 HOURS AS NEEDED FOR MUSCLE SPASM   budesonide-formoterol 160-4.5 MCG/ACT inhaler Commonly known as:  SYMBICORT Inhale 1 puff into the lungs 2 (two) times daily. What changed:  when to take this  reasons to take this   CHEST CONGESTION RELIEF PO Take 1-1.5 tablets by mouth 2 (two) times daily.   cholecalciferol 1000 units tablet Commonly known as:  VITAMIN D Take 1,000 Units by mouth daily.   docusate sodium 100 MG capsule Commonly known as:  COLACE Take 100 mg by mouth daily as needed for moderate constipation.   ferrous sulfate 325 (65 FE) MG tablet TAKE 1 TABLET ONCE DAILY WITH BREAKFAST   fluticasone 50 MCG/ACT nasal spray Commonly known as:  FLONASE Place 1 spray into both nostrils 2 (two) times  daily. What changed:  when to take this  reasons to take this   glucosamine-chondroitin 500-400 MG tablet Take 1 tablet by mouth 2 (two) times daily.   hydrocortisone 25 MG suppository Commonly known as:  ANUSOL-HC Place 1 suppository (25 mg total) rectally 2 (two) times daily as needed for hemorrhoids or itching.   Insulin Detemir 100 UNIT/ML Pen Commonly known as:  LEVEMIR FLEXTOUCH INJECT 8 units once a day each morning What changed:  See the new instructions.   Ipratropium-Albuterol 20-100 MCG/ACT Aers respimat Commonly known as:  COMBIVENT Inhale 1 puff into the lungs every 6 (six) hours as needed for wheezing or shortness of breath.   nitroGLYCERIN 0.4 MG SL tablet Commonly known as:  NITROSTAT Place 1 tablet (0.4 mg total) under the tongue every 5 (five) minutes as needed for chest pain.   ondansetron 4 MG disintegrating tablet Commonly known as:  ZOFRAN ODT Take 1 tablet (4 mg total) by mouth every 8 (eight) hours as needed for nausea or vomiting.    OXYGEN Inhale 2.5 L into the lungs daily.   pantoprazole 40 MG tablet Commonly known as:  PROTONIX Take 1 tablet (40 mg total) by mouth daily. What changed:  when to take this   Pen Needles 31G X 6 MM Misc Use to administer insulin up to qid   polyethylene glycol powder powder Commonly known as:  GLYCOLAX/MIRALAX Take 17 g by mouth daily as needed. What changed:  reasons to take this   polyethylene glycol packet Commonly known as:  MIRALAX / GLYCOLAX Take 17 g by mouth 2 (two) times daily as needed for moderate constipation. What changed:  Another medication with the same name was changed. Make sure you understand how and when to take each.   potassium chloride SA 20 MEQ tablet Commonly known as:  K-DUR,KLOR-CON TAKE 1 TABLET DAILY   PRESERVISION AREDS Caps Take 1 capsule by mouth daily with breakfast.   CENTRUM ADULTS Tabs Take 1 tablet by mouth daily with breakfast.   PRODIGY TWIST TOP LANCETS 28G Misc CHECK BLOOD SUGAR UP TO 4 TIMES A DAY   simethicone 80 MG chewable tablet Commonly known as:  MYLICON Chew 80 mg by mouth at bedtime as needed for flatulence.   torsemide 20 MG tablet Commonly known as:  DEMADEX Take 40 mg (2 tabs) in morning, 20mg  (1 tab) in the evening What changed:  See the new instructions.   traMADol 50 MG tablet Commonly known as:  ULTRAM Take 1 tablet (50 mg total) by mouth every 6 (six) hours as needed for severe pain.   vitamin C 500 MG tablet Commonly known as:  ASCORBIC ACID Take 500 mg by mouth at bedtime.   warfarin 4 MG tablet Commonly known as:  COUMADIN Take 1.5 tablets (6 mg total) by mouth daily.      Follow-up Information    Redge Gainer, MD. Schedule an appointment as soon as possible for a visit in 1 week(s).   Specialty:  Family Medicine Contact information: [place] [address] [phone]        Quay Burow, MD. Schedule an appointment as soon as possible for a visit in 1 week(s).    Specialties:  Cardiology, Radiology Contact information: [address] [place] [address] [phone]          Allergies  Allergen Reactions  . Atorvastatin Other (See Comments)     muscle aches  . Codeine Nausea And Vomiting  .  Morphine Nausea And Vomiting  . Other Other (See Comments)    OPIATES cause nausea and vomiting  . Rosuvastatin Other (See Comments)    muscle aches at high doses, held as of 12/2010 due to aches  . Sulfa Antibiotics Nausea And Vomiting  . Iohexol Other (See Comments)     Desc: unknown reaction; allergic to iodine and contrast     Consultations:  Cardiology    Procedures/Studies: Dg Chest 2 View  Result Date: 08/07/2016 CLINICAL DATA:  Dyspnea. Recent history of pneumonia with antibiotic administration. Pitting edema in lower extremities. EXAM: CHEST  2 VIEW COMPARISON:  None. FINDINGS: Cardiomegaly with aortic atherosclerosis. The thoracic aorta is slightly uncoiled in appearance without aneurysm. Mild interstitial edema is noted with trace posterior pleural effusions. Trace fluid in the right major fissure. No pulmonary consolidation. No pneumothorax. Degenerative changes along the dorsal spine. IMPRESSION: Stable cardiomegaly with aortic atherosclerosis. Mild interstitial pulmonary edema. Trace posterior pleural effusions. Electronically Signed   By: Ashley Royalty M.D.   On: 08/07/2016 22:40   Dg Chest 2 View  Result Date: 08/03/2016 CLINICAL DATA:  Pneumonia or flu symptoms without clinical improvement since the previous study EXAM: CHEST  2 VIEW COMPARISON:  PA and lateral chest x-ray of July 25, 2016 FINDINGS: The lungs remain hyperinflated. There is persistent infiltrate anteriorly in the right lower lobe. The cardiac silhouette remains enlarged. The pulmonary vascularity is not engorged. There is calcification in the wall of the thoracic aorta. There is no significant pleural effusion. There is multilevel degenerative disc  disease of the thoracic spine with moderate dextrocurvature centered at the thoracolumbar junction. IMPRESSION: COPD. Persistent infiltrate anteriorly in the right lower lobe. Cardiomegaly without pulmonary edema. Given the lack of significant improvement an additional follow-up chest x-ray in 2-3 weeks is recommended. Chest CT scanning may be ultimately needed to more completely evaluate the pulmonary parenchyma. Thoracic aortic atherosclerosis. Electronically Signed   By: David  Martinique M.D.   On: 08/03/2016 08:10   Dg Chest 2 View  Result Date: 07/26/2016 CLINICAL DATA:  Cough and congestion four 3 days. EXAM: CHEST  2 VIEW COMPARISON:  05/08/2016 FINDINGS: Mild to moderate enlargement of the cardiopericardial silhouette, stable. No mediastinal or hilar masses. No convincing adenopathy. There is airspace opacity in the medial right lower lobe projecting over the right heart border on the frontal view. No other focal lung consolidation. There are thickened interstitial markings bilaterally, chronic. Lungs are hyperexpanded. No pleural effusion.  No pneumothorax. The skeletal structures are demineralized. There is a dextroscoliosis at the thoracolumbar junction. IMPRESSION: 1. Right lower lobe pneumonia. 2. No other acute findings. Cardiomegaly with chronic interstitial thickening. Electronically Signed   By: Lajean Manes M.D.   On: 07/26/2016 08:17    Echo  Study Conclusions - Left ventricle: The cavity size was normal. There was moderate   concentric hypertrophy. Systolic function was normal. The   estimated ejection fraction was in the range of 55% to 60%. Wall   motion was normal; there were no regional wall motion   abnormalities. There was a reduced contribution of atrial   contraction to ventricular filling, due to increased ventricular   diastolic pressure or atrial contractile dysfunction. Doppler   parameters are consistent with a reversible restrictive pattern,   indicative of decreased  left ventricular diastolic compliance   and/or increased left atrial pressure (grade 3 diastolic   dysfunction). Doppler parameters are consistent with both   elevated ventricular end-diastolic filling pressure and elevated  left atrial filling pressure. - Aortic valve: Valve mobility was restricted. - Mitral valve: Calcified annulus. Mildly thickened, mildly   calcified leaflets . There was mild regurgitation. - Left atrium: The atrium was moderately dilated. - Right atrium: The atrium was moderately dilated. - Pulmonary arteries: PA peak pressure: 38 mm Hg (S).  Impressions: - The right ventricular systolic pressure was increased consistent with mild pulmonary hypertension.   Discharge Exam: Vitals:   08/11/16 2050 08/12/16 0540  BP: 140/82 130/68  Pulse: 65 71  Resp: 18 18  Temp: 98.4 F (36.9 C) 97.7 F (36.5 C)   Vitals:   08/11/16 1019 08/11/16 2042 08/11/16 2050 08/12/16 0540  BP:   140/82 130/68  Pulse:   65 71  Resp:   18 18  Temp:   98.4 F (36.9 C) 97.7 F (36.5 C)  TempSrc:   Oral Oral  SpO2: 100% 100% 100% 98%  Weight:    53.2 kg (117 lb 4.8 oz)  Height:        General: Pt is alert, awake, not in acute distress Cardiovascular: RRR, S1/S2 +, no rubs, no gallops Respiratory: CTA bilaterally, no wheezing, no rhonchi, on [place] O2, no respiratory distress  Abdominal: Soft, NT, ND, bowel sounds + Extremities: +trace edema, no cyanosis    The results of significant diagnostics from this hospitalization (including imaging, microbiology, ancillary and laboratory) are listed below for reference.     Microbiology: No results found for this or any previous visit (from the past 240 hour(s)).   Labs: BNP (last 3 results)  Recent Labs  05/08/16 1220 07/25/16 1709 08/07/16 2138  BNP 251.2* 616.8* [id number]*   Basic Metabolic Panel:  Recent Labs Lab 08/07/16 2138 08/07/16 2158 08/09/16 0534 08/10/16 0401 08/11/16 0424 08/12/16 0531  NA 141 143 144 141  140 142  K 4.1 4.0 4.0 4.0 3.9 4.3  CL 98* 98* 97* 94* 98* 98*  CO2 36*  --  34* 37* 34* 33*  GLUCOSE 219* 223* 97 124* 176* 86  BUN 25* 28* 28* 32* 39* 39*  CREATININE 1.27* 1.50* 1.35* 1.42* 1.45* 1.46*  CALCIUM 9.5  --  9.1 8.8* 8.9 9.1   Liver Function Tests:  Recent Labs Lab 08/07/16 2138  AST 28  ALT 43  ALKPHOS 79  BILITOT 0.6  PROT 5.9*  ALBUMIN 3.1*   No results for input(s): LIPASE, AMYLASE in the last 168 hours. No results for input(s): AMMONIA in the last 168 hours. CBC:  Recent Labs Lab 08/07/16 2138 08/07/16 2158 08/09/16 0534 08/10/16 0401  WBC 5.6  --  4.8 5.5  NEUTROABS 3.8  --   --   --   HGB 10.8* 11.9* 10.6* 10.0*  HCT 35.6* 35.0* 35.3* 33.4*  MCV 100.3*  --  100.0 101.2*  PLT 213  --  175 172   Cardiac Enzymes:  Recent Labs Lab 08/08/16 0358 08/08/16 1115  TROPONINI 0.06* 0.05*   BNP: Invalid input(s): POCBNP CBG:  Recent Labs Lab 08/11/16 0740 08/11/16 1132 08/11/16 1657 08/11/16 2054 08/12/16 0734  GLUCAP 85 118* 155* 99 89   D-Dimer No results for input(s): DDIMER in the last 72 hours. Hgb A1c No results for input(s): HGBA1C in the last 72 hours. Lipid Profile No results for input(s): CHOL, HDL, LDLCALC, TRIG, CHOLHDL, LDLDIRECT in the last 72 hours. Thyroid function studies No results for input(s): TSH, T4TOTAL, T3FREE, THYROIDAB in the last 72 hours.  Invalid input(s): FREET3 Anemia work up No results for input(s): VITAMINB12,  FOLATE, FERRITIN, TIBC, IRON, RETICCTPCT in the last 72 hours. Urinalysis    Component Value Date/Time   COLORURINE YELLOW 04/06/2015 1929   APPEARANCEUR Clear 06/14/2016 1102   LABSPEC 1.012 04/06/2015 1929   PHURINE 6.0 04/06/2015 1929   GLUCOSEU Negative 06/14/2016 1102   HGBUR NEGATIVE 04/06/2015 1929   BILIRUBINUR Negative 06/14/2016 1102   [place] 04/06/2015 1929   PROTEINUR 1+ (A) 06/14/2016 1102   PROTEINUR 30 (A) 04/06/2015 1929   UROBILINOGEN negative 05/20/2015  0949   UROBILINOGEN 0.2 04/06/2015 1929   NITRITE Negative 06/14/2016 1102   NITRITE NEGATIVE 04/06/2015 1929   LEUKOCYTESUR Negative 06/14/2016 1102   Sepsis Labs Invalid input(s): PROCALCITONIN,  WBC,  LACTICIDVEN Microbiology No results found for this or any previous visit (from the past 240 hour(s)).   Time coordinating discharge: 35 minutes  SIGNED:  Dessa Phi, DO [facility] Pager [phone]  If 7PM-7AM, please contact night-coverage [email or web address] Password [facility] 08/12/2016, 9:16 AM

## 2016-08-12 NOTE — Progress Notes (Signed)
Progress Note  Patient Name: [name] Date of Encounter: 08/12/2016  Primary Cardiologist: Quay Burow MD  Subjective   Feeling much better. Denies SOB. No angina.   Inpatient Medications    Scheduled Meds: . amiodarone  100 mg Oral Daily  . atorvastatin  20 mg Oral QHS  . guaiFENesin  600 mg Oral BID  . insulin aspart  0-9 Units Subcutaneous TID WC  . insulin detemir  7 Units Subcutaneous Daily  . mometasone-formoterol  2 puff Inhalation BID  . NIFEdipine  30 mg Oral Daily  . pantoprazole  40 mg Oral Q breakfast  . potassium chloride SA  20 mEq Oral Daily  . sodium chloride flush  3 mL Intravenous Q12H  . torsemide  20 mg Oral QPM  . torsemide  40 mg Oral q morning - 10a  . warfarin  6 mg Oral q1800  . Warfarin - Pharmacist Dosing Inpatient   Does not apply q1800   Continuous Infusions:  PRN Meds: acetaminophen, azelastine, docusate sodium, fluticasone, hydrocortisone, ipratropium-albuterol, MUSCLE RUB, nitroGLYCERIN, polyethylene glycol, simethicone, traZODone   Vital Signs    Vitals:   08/11/16 1019 08/11/16 2042 08/11/16 2050 08/12/16 0540  BP:   140/82 130/68  Pulse:   65 71  Resp:   18 18  Temp:   98.4 F (36.9 C) 97.7 F (36.5 C)  TempSrc:   Oral Oral  SpO2: 100% 100% 100% 98%  Weight:    117 lb 4.8 oz (53.2 kg)  Height:        Intake/Output Summary (Last 24 hours) at 08/12/16 1011 Last data filed at 08/12/16 0920  Gross per 24 hour  Intake              243 ml  Output              400 ml  Net             -157 ml   Filed Weights   08/10/16 0556 08/11/16 0452 08/12/16 0540  Weight: 113 lb 1.6 oz (51.3 kg) 118 lb 3.2 oz (53.6 kg) 117 lb 4.8 oz (53.2 kg)    Telemetry    Off monitor  ECG    N/A  Physical Exam   General: Well developed, well nourished, female in no acute distress Head: Eyes PERRLA, No xanthomas.   Normocephalic and atraumatic, oropharynx without edema or exudate. Dentition:  Lungs: Regular effort, clear to  ascultation bilaterally. Heart: HRRR S1 S2, no rub/gallop, Heart irregular rate and rhythm with S1, S2  murmur. pulses are 2+ extrem.   Neck: No carotid bruits. No lymphadenopathy. Abdomen: Bowel sounds present, abdomen soft and non-tender without masses or hernias noted. Msk:  No spine or cva tenderness. No weakness, no joint deformities or effusions. Extremities: No clubbing or cyanosis.  edema.  Neuro: Alert and oriented X 3. No focal deficits noted. Psych:  Good affect, responds appropriately. Skin: No rashes or lesions noted.  Labs    Chemistry Recent Labs Lab 08/07/16 2138  08/10/16 0401 08/11/16 0424 08/12/16 0531  NA 141  < > 141 140 142  K 4.1  < > 4.0 3.9 4.3  CL 98*  < > 94* 98* 98*  CO2 36*  < > 37* 34* 33*  GLUCOSE 219*  < > 124* 176* 86  BUN 25*  < > 32* 39* 39*  CREATININE 1.27*  < > 1.42* 1.45* 1.46*  CALCIUM 9.5  < > 8.8* 8.9 9.1  PROT 5.9*  --   --   --   --  ALBUMIN 3.1*  --   --   --   --   AST 28  --   --   --   --   ALT 43  --   --   --   --   ALKPHOS 79  --   --   --   --   BILITOT 0.6  --   --   --   --   GFRNONAA 37*  < > 32* 32* 31*  GFRAA 43*  < > 38* 37* 36*  ANIONGAP 7  < > 10 8 11   < > = values in this interval not displayed.   Hematology Recent Labs Lab 08/07/16 2138 08/07/16 2158 08/09/16 0534 08/10/16 0401  WBC 5.6  --  4.8 5.5  RBC 3.55*  --  3.53* 3.30*  HGB 10.8* 11.9* 10.6* 10.0*  HCT 35.6* 35.0* 35.3* 33.4*  MCV 100.3*  --  100.0 101.2*  MCH 30.4  --  30.0 30.3  MCHC 30.3  --  30.0 29.9*  RDW 14.0  --  14.1 14.2  PLT 213  --  175 172    Cardiac Enzymes Recent Labs Lab 08/08/16 0358 08/08/16 1115  TROPONINI 0.06* 0.05*    Recent Labs Lab 08/07/16 2156  TROPIPOC 0.05     BNP Recent Labs Lab 08/07/16 2138  BNP 580.7*     DDimer No results for input(s): DDIMER in the last 168 hours.   Radiology    No results found.  Cardiac Studies   Echo:  Transthoracic Echocardiography 08/08/2016  Study  Conclusions  - Left ventricle: The cavity size was normal. There was moderate concentric hypertrophy. Systolic function was normal. The estimated ejection fraction was in the range of 55% to 60%. Wall motion was normal; there were no regional wall motion abnormalities. There was a reduced contribution of atrial contraction to ventricular filling, due to increased ventricular diastolic pressure or atrial contractile dysfunction. Doppler parameters are consistent with a reversible restrictive pattern, indicative of decreased left ventricular diastolic compliance and/or increased left atrial pressure (grade 3 diastolic dysfunction). Doppler parameters are consistent with both elevated ventricular end-diastolic filling pressure and elevated left atrial filling pressure. - Aortic valve: Valve mobility was restricted. - Mitral valve: Calcified annulus. Mildly thickened, mildly calcified leaflets . There was mild regurgitation. - Left atrium: The atrium was moderately dilated. - Right atrium: The atrium was moderately dilated. - Pulmonary arteries: PA peak pressure: 38 mm Hg (S).  Impressions:  - The right ventricular systolic pressure was increased consistent with mild pulmonary hypertension.   Patient Profile     83 y.o. female with a history of  End stage COPD chronically on 2-5L [place], hx of CHF TTE 08/23/11 = normal EF, recently treated for community acquired pneumonia,  hx of paroxysmal atrial fibrillation, history of Myocardial infarction, Type II diabetes: A1c is 8, stroke in 2014, CAD. Patient presented to the Emergency Department on 08/07/2016 complaining of bilateral lower extremity swelling with weeping, 2-4 lb weight gain in two days, shortness of breath worse than her baseline.  Assessment & Plan    1. Acute on chronic systolic congestive heart failure: Transthoracic echocardiogram done on 08/08/16 with impression of right ventricular systolic  pressure increased consistent with mild pulmonary hypertension. - Resume Torsemide, increase to 40 mg  In the AM and 20 mg in the PM. - Daily weights - Will need Nitroglycerin tablet refills, pt out of medication. - she is ready for DC today from a cardiac  standpoint.  2. Hyperlipidemia: LDL is 71 on Lipitor  3. Paroxysmal atrial fibrillation: On Amiodarone and Wafarin. Toprol, Nifedipine withheld due to bradycardia. - Resume Nifedipine as an outpatient if she has increase in blood pressure or chest pain. - Discontinue Toprol due to bradycardia  4. COPD with emphysema: internal medicine to manage.  5. Hypertension: ACE withheld due to elevated creatinine at 1.45, baseline is around 1.25.   - consider resuming ACE as outpatient when renal function is stable.  6. CAD: Remote RCA PCI, last cath 01/2010.  7. Diabetes Type II: Internal medicine to follow  Patient will need close follow-up as an outpatient with Dr. Laurence Spates, Glennon Kopko [place], MD  08/12/2016, 10:11 AM

## 2016-08-12 NOTE — Progress Notes (Signed)
Pt AOx4, refusing to wear cardiac monitor stating "I just don't see the point since I am going home today". Pt dressed and ambulating around room independently waiting to hear from doctors to go home today. Will continue to monitor.

## 2016-08-13 DIAGNOSIS — I259 Chronic ischemic heart disease, unspecified: Secondary | ICD-10-CM | POA: Diagnosis not present

## 2016-08-13 DIAGNOSIS — J449 Chronic obstructive pulmonary disease, unspecified: Secondary | ICD-10-CM | POA: Diagnosis not present

## 2016-08-14 DIAGNOSIS — I5089 Other heart failure: Secondary | ICD-10-CM | POA: Diagnosis not present

## 2016-08-14 DIAGNOSIS — J449 Chronic obstructive pulmonary disease, unspecified: Secondary | ICD-10-CM | POA: Diagnosis not present

## 2016-08-14 DIAGNOSIS — M62838 Other muscle spasm: Secondary | ICD-10-CM | POA: Diagnosis not present

## 2016-08-14 DIAGNOSIS — I270 Primary pulmonary hypertension: Secondary | ICD-10-CM | POA: Diagnosis not present

## 2016-08-14 DIAGNOSIS — I259 Chronic ischemic heart disease, unspecified: Secondary | ICD-10-CM | POA: Diagnosis not present

## 2016-08-15 ENCOUNTER — Telehealth: Payer: Self-pay | Admitting: *Deleted

## 2016-08-15 ENCOUNTER — Other Ambulatory Visit: Payer: Self-pay | Admitting: *Deleted

## 2016-08-15 ENCOUNTER — Other Ambulatory Visit: Payer: Self-pay | Admitting: Pulmonary Disease

## 2016-08-15 NOTE — Telephone Encounter (Signed)
Call Completed and Appointment Scheduled: Yes, Date: 08/17/2016  with Dr Dettinger   DISCHARGE INFORMATION Date of Discharge:08/12/2016  Discharge Facility: [facility]  Principal Discharge Diagnosis: Heart failure  Patient and/or caregiver is knowledgeable of his/her condition(s) and treatment: Yes  MEDICATION RECONCILIATION Current medication list reviewed with patient:Yes  Outpatient Encounter Prescriptions as of 08/15/2016  Medication Sig  . ACCU-CHEK AVIVA PLUS test strip Test tid. Dx E11.9 (Patient not taking: Reported on 08/08/2016)  . acetaminophen (TYLENOL) 500 MG tablet Take 500 mg by mouth every 4 (four) hours as needed for mild pain.   Marland Kitchen amiodarone (PACERONE) 200 MG tablet TAKE (1/2) TABLET DAILY.  Marland Kitchen aspirin EC 81 MG tablet Take 81 mg by mouth daily with breakfast.   . atorvastatin (LIPITOR) 20 MG tablet TAKE 1 TABLET DAILY IN THE EVENING (Patient taking differently: TAKE 20 mg by mouth at bedtime)  . Azelastine HCl 0.15 % SOLN Place 1 spray into both nostrils 2 (two) times daily as needed (congestion).  . baclofen (LIORESAL) 10 MG tablet TAKE 1/2 TABLET EVERY 8 HOURS AS NEEDED FOR MUSCLE SPASM  . budesonide-formoterol (SYMBICORT) 160-4.5 MCG/ACT inhaler Inhale 1 puff into the lungs 2 (two) times daily. (Patient taking differently: Inhale 1 puff into the lungs 2 (two) times daily as needed (wheezing). )  . cholecalciferol (VITAMIN D) 1000 units tablet Take 1,000 Units by mouth daily.  Marland Kitchen docusate sodium (COLACE) 100 MG capsule Take 100 mg by mouth daily as needed for moderate constipation.   . ferrous sulfate 325 (65 FE) MG tablet TAKE 1 TABLET ONCE DAILY WITH BREAKFAST  . fluticasone (FLONASE) 50 MCG/ACT nasal spray Place 1 spray into both nostrils 2 (two) times daily. (Patient taking differently: Place 1 spray into both nostrils as needed. )  . glucosamine-chondroitin 500-400 MG tablet Take 1 tablet by mouth 2 (two) times daily.  . GuaiFENesin (CHEST CONGESTION RELIEF PO) Take  1-1.5 tablets by mouth 2 (two) times daily.  . hydrocortisone (ANUSOL-HC) 25 MG suppository Place 1 suppository (25 mg total) rectally 2 (two) times daily as needed for hemorrhoids or itching.  . Insulin Detemir (LEVEMIR FLEXTOUCH) 100 UNIT/ML Pen INJECT 8 units once a day each morning  . Insulin Pen Needle (PEN NEEDLES) 31G X 6 MM MISC Use to administer insulin up to qid (Patient not taking: Reported on 08/08/2016)  . Ipratropium-Albuterol (COMBIVENT) 20-100 MCG/ACT AERS respimat Inhale 1 puff into the lungs every 6 (six) hours as needed for wheezing or shortness of breath.  . Multiple Vitamins-Minerals (CENTRUM ADULTS) TABS Take 1 tablet by mouth daily with breakfast.  . Multiple Vitamins-Minerals (PRESERVISION AREDS) CAPS Take 1 capsule by mouth daily with breakfast.  . nitroGLYCERIN (NITROSTAT) 0.4 MG SL tablet Place 1 tablet (0.4 mg total) under the tongue every 5 (five) minutes as needed for chest pain.  Marland Kitchen ondansetron (ZOFRAN ODT) 4 MG disintegrating tablet Take 1 tablet (4 mg total) by mouth every 8 (eight) hours as needed for nausea or vomiting.  . OXYGEN Inhale 2.5 L into the lungs daily.  . pantoprazole (PROTONIX) 40 MG tablet Take 1 tablet (40 mg total) by mouth daily. (Patient taking differently: Take 40 mg by mouth daily with breakfast. )  . polyethylene glycol (MIRALAX / GLYCOLAX) packet Take 17 g by mouth 2 (two) times daily as needed for moderate constipation.  . polyethylene glycol powder (GLYCOLAX/MIRALAX) powder Take 17 g by mouth daily as needed. (Patient taking differently: Take 17 g by mouth daily as needed for mild constipation. )  .  potassium chloride SA (K-DUR,KLOR-CON) 20 MEQ tablet TAKE 1 TABLET DAILY  . PRODIGY TWIST TOP LANCETS 28G MISC CHECK BLOOD SUGAR UP TO 4 TIMES A DAY (Patient not taking: Reported on 08/08/2016)  . simethicone (MYLICON) 80 MG chewable tablet Chew 80 mg by mouth at bedtime as needed for flatulence.  . torsemide (DEMADEX) 20 MG tablet Take 40 mg (2  tabs) in morning, 20mg  (1 tab) in the evening  . traMADol (ULTRAM) 50 MG tablet Take 1 tablet (50 mg total) by mouth every 6 (six) hours as needed for severe pain. (Patient not taking: Reported on 08/08/2016)  . vitamin C (ASCORBIC ACID) 500 MG tablet Take 500 mg by mouth at bedtime.   Marland Kitchen warfarin (COUMADIN) 4 MG tablet Take 1.5 tablets (6 mg total) by mouth daily.   No facility-administered encounter medications on file as of 08/15/2016.     Discharge Medications reviewed and reconciled with current medications.yes  Patient is able to obtain needed medications:Yes  ACTIVITIES OF DAILY LIVING  Is the patient able to perform his/her own ADLs: No.  Patient is receiving home health services: No.  PATIENT EDUCATION Questions/Concerns Discussed: pt is still having some swelling

## 2016-08-15 NOTE — Patient Outreach (Addendum)
Telephone call to pt for transition of care week 1, spoke with pt, HIPAA verified, pt reports this conversation will be limited because she has a friend coming over. Pt reports she lives alone and has a friend " that helps most all day long"  Pt states she manages well, weighs daily with weight today 117 pounds, has medications and takes as prescribed and relies solely on her pharmacy to prefill for her, pt unable to reconcile medications from discharge summary and states " they fill them for me and I trust it's right, I've never had a problem and as far as I know, everything is here" pt reports she will not be wearing compression hose for her legs as this is not an option for her to help with lower extremity edema. pt unable to drive and family members assist her with transportation, pt requests resources for transportation as backup plan.  RN CM talked with pt about scheduling home visit this week and pt states will need to be next week because she is very busy this week, pt agreeable to having RN CM write out transportation resources and does not feel she needs social work refrerral at present.   [facility] CM Care Plan Problem One   Flowsheet Row Most Recent Value  Care Plan Problem One  Pt high risk for readmission related to CHF  Role Documenting the Problem One  Care Management [place] for Problem One  Active  [facility]N Long Term Goal (31-90 days)  Pt will demonstrate adequate self care for CHF within 90 days  [facility]N Long Term Goal Start Date  08/15/16  Interventions for Problem One Long Term Goal  RN CM reviewed discharge instructions with pt over the telephone, reviewed all upcoming MD appointments, reviewed diet, medications  [facility] CM Short Term Goal #1 (0-30 days)  Pt will verbalize CHF zones within 30 days  [facility] CM Short Term Goal #1 Start Date  08/15/16  Interventions for Short Term Goal #1  RN CM reviewed importance of daily weights, CHF action plan/ zones      PLAN See pt for initial home  visit next week  Jacqlyn Larsen [facility], [place] [phone]

## 2016-08-17 ENCOUNTER — Encounter: Payer: Self-pay | Admitting: Family Medicine

## 2016-08-17 ENCOUNTER — Ambulatory Visit (INDEPENDENT_AMBULATORY_CARE_PROVIDER_SITE_OTHER): Payer: Medicare Other | Admitting: Family Medicine

## 2016-08-17 ENCOUNTER — Other Ambulatory Visit: Payer: Self-pay | Admitting: Family Medicine

## 2016-08-17 VITALS — BP 137/52 | HR 60 | Temp 97.7°F | Ht 61.52 in | Wt 118.6 lb

## 2016-08-17 DIAGNOSIS — I480 Paroxysmal atrial fibrillation: Secondary | ICD-10-CM | POA: Diagnosis not present

## 2016-08-17 DIAGNOSIS — N182 Chronic kidney disease, stage 2 (mild): Secondary | ICD-10-CM | POA: Diagnosis not present

## 2016-08-17 DIAGNOSIS — D631 Anemia in chronic kidney disease: Secondary | ICD-10-CM

## 2016-08-17 DIAGNOSIS — I509 Heart failure, unspecified: Secondary | ICD-10-CM | POA: Diagnosis not present

## 2016-08-17 DIAGNOSIS — N179 Acute kidney failure, unspecified: Secondary | ICD-10-CM

## 2016-08-17 LAB — COAGUCHEK XS/INR WAIVED
INR: 1.5 — ABNORMAL HIGH (ref 0.9–1.1)
Prothrombin Time: 17.5 s

## 2016-08-17 LAB — POCT INR: INR: 1.5

## 2016-08-17 NOTE — Patient Instructions (Signed)
Anticoagulation Dose Instructions as of 08/17/2016      Dorene Grebe Tue Wed Thu Fri Sat   New Dose 6 mg 6 mg 6 mg 6 mg 6 mg 6 mg 6 mg    Description   INR is 1.5 today, increase to 6 mg daily

## 2016-08-17 NOTE — Progress Notes (Signed)
BP (!) 137/52   Pulse 60   Temp 97.7 F (36.5 C) (Oral)   Ht 5' 1.52" (1.563 m)   Wt 118 lb 9.6 oz (53.8 kg)   BMI 22.03 kg/m    Subjective:    Patient ID: [name], female    DOB: 05-14-1928, 83 y.o.   MRN: [id number]  HPI: [name] is a 83 y.o. female presenting on 08/17/2016 for Hospitalization Follow-up (2/11 heart failure)   HPI Hospital follow-up for congestive heart failure and kidney injury Patient is coming in today for hospital follow-up. She was on the hospital from 08/07/2016 to 08/12/2016. She was in the hospital for congestive heart failure and shortness of breath and swelling that had worsened despite being on torsemide 60 mg daily. In the hospital visit they did IV diuresis to get some of the excess fluid off of her and showed on an echocardiogram that she had grade 3 diastolic dysfunction. She also had a slower heart rate during the hospitalization slowly stopped some of her medications for that but then told her to resume them upon discharge. During the hospital stay she did see one of Dr. Kennon Holter partners who is her cardiologist. Today in office she still complains of some swelling but much improved from where it was previously and she denies any shortness of breath or cough or congestion or wheezing. She is also coming in today for a Coumadin recheck for her A. fib. Because of the diuresis over the hospital stay she did develop some acute kidney injury that slightly worsened from her baseline. She is coming in today for recheck. On discharge they also increased her torsemide to 80 mg daily spread throughout the day.  Relevant past medical, surgical, family and social history reviewed and updated as indicated. Interim medical history since our last visit reviewed. Allergies and medications reviewed and updated.  Review of Systems  Constitutional: Negative for chills and fever.  HENT: Negative for congestion, ear discharge and ear pain.   Eyes: Negative for  redness and visual disturbance.  Respiratory: Negative for cough, chest tightness, shortness of breath and wheezing.   Cardiovascular: Positive for leg swelling. Negative for chest pain and palpitations.  Genitourinary: Negative for decreased urine volume, difficulty urinating and dysuria.  Musculoskeletal: Negative for back pain and gait problem.  Skin: Negative for rash.  Neurological: Negative for dizziness, light-headedness and headaches.  Psychiatric/Behavioral: Negative for agitation and behavioral problems.  All other systems reviewed and are negative.   Per HPI unless specifically indicated above     Objective:    BP (!) 137/52   Pulse 60   Temp 97.7 F (36.5 C) (Oral)   Ht 5' 1.52" (1.563 m)   Wt 118 lb 9.6 oz (53.8 kg)   BMI 22.03 kg/m   Wt Readings from Last 3 Encounters:  08/17/16 118 lb 9.6 oz (53.8 kg)  08/12/16 117 lb 4.8 oz (53.2 kg)  07/29/16 116 lb (52.6 kg)    Physical Exam  Constitutional: She is oriented to person, place, and time. She appears well-developed and well-nourished. No distress.  Eyes: Conjunctivae are normal.  Neck: Neck supple. No thyromegaly present.  Cardiovascular: Normal rate, regular rhythm, normal heart sounds and intact distal pulses.   No murmur heard. Pulmonary/Chest: Effort normal. No respiratory distress. She has no wheezes. She has rales (Bibasilar crackles).  Musculoskeletal: Normal range of motion. She exhibits edema (2+ pitting edema below the knee bilaterally). She exhibits no tenderness.  Lymphadenopathy:    She  has no cervical adenopathy.  Neurological: She is alert and oriented to person, place, and time. Coordination normal.  Skin: Skin is warm and dry. No rash noted. She is not diaphoretic.  Psychiatric: She has a normal mood and affect. Her behavior is normal.  Nursing note and vitals reviewed.   Results for orders placed or performed in visit on 08/17/16  POCT INR  Result Value Ref Range   INR 1.5     Anticoagulation Dose Instructions as of 08/17/2016      Dorene Grebe Tue Wed Thu Fri Sat   New Dose 6 mg 6 mg 6 mg 6 mg 6 mg 6 mg 6 mg    Description   INR is 1.5 today, increase to 6 mg daily        Assessment & Plan:   Problem List Items Addressed This Visit      Cardiovascular and Mediastinum   PAF, on Amiodarone/Coumadin/metoprolol. (Chronic)   Relevant Orders   Ambulatory referral to Cardiology   CHF (congestive heart failure) ([place]) - Primary   Relevant Orders   Ambulatory referral to Cardiology     Other   Anemia   Relevant Orders   CBC with Differential/Platelet    Other Visit Diagnoses    Acute kidney injury ([place])       Relevant Orders   CMP14+EGFR       Follow up plan: Return if symptoms worsen or fail to improve, for Come back in 2 weeks for Coumadin recheck with Tammy Eckard or her team.  Counseling provided for all of the vaccine components Orders Placed This Encounter  Procedures  . CBC with Differential/Platelet  . CMP14+EGFR  . POCT INR    Caryl Pina, MD [facility] 08/17/2016, 1:40 PM

## 2016-08-18 LAB — CMP14+EGFR
ALBUMIN: 3.6 g/dL (ref 3.5–4.7)
ALK PHOS: 84 IU/L (ref 39–117)
ALT: 43 IU/L — AB (ref 0–32)
AST: 37 IU/L (ref 0–40)
Albumin/Globulin Ratio: 1.5 (ref 1.2–2.2)
BUN / CREAT RATIO: 21 (ref 12–28)
BUN: 25 mg/dL (ref 8–27)
Bilirubin Total: 0.4 mg/dL (ref 0.0–1.2)
CO2: 29 mmol/L (ref 18–29)
CREATININE: 1.21 mg/dL — AB (ref 0.57–1.00)
Calcium: 9 mg/dL (ref 8.7–10.3)
Chloride: 102 mmol/L (ref 96–106)
GFR calc Af Amer: 47 — ABNORMAL LOW (ref >59)
GFR calc non Af Amer: 41 — ABNORMAL LOW (ref >59)
GLUCOSE: 110 mg/dL — AB (ref 65–99)
Globulin, Total: 2.4 (ref 1.5–4.5)
Potassium: 4.4 mmol/L (ref 3.5–5.2)
Sodium: 147 mmol/L — ABNORMAL HIGH (ref 134–144)
TOTAL PROTEIN: 6 g/dL (ref 6.0–8.5)

## 2016-08-18 LAB — CBC WITH DIFFERENTIAL/PLATELET
BASOS ABS: 0.1 10*3/uL (ref 0.0–0.2)
Basos: 1 %
EOS (ABSOLUTE): 0.1 10*3/uL (ref 0.0–0.4)
Eos: 2 %
HEMOGLOBIN: 10.6 g/dL — AB (ref 11.1–15.9)
Hematocrit: 33.7 % — ABNORMAL LOW (ref 34.0–46.6)
IMMATURE GRANS (ABS): 0 10*3/uL (ref 0.0–0.1)
Immature Granulocytes: 0 %
LYMPHS: 25 %
Lymphocytes Absolute: 1.2 10*3/uL (ref 0.7–3.1)
MCH: 30.6 pg (ref 26.6–33.0)
MCHC: 31.5 g/dL (ref 31.5–35.7)
MCV: 97 fL (ref 79–97)
MONOCYTES: 10 %
Monocytes Absolute: 0.5 10*3/uL (ref 0.1–0.9)
NEUTROS PCT: 62 %
Neutrophils Absolute: 2.9 10*3/uL (ref 1.4–7.0)
PLATELETS: 196 10*3/uL (ref 150–379)
RBC: 3.46 x10E6/uL — AB (ref 3.77–5.28)
RDW: 13.9 % (ref 12.3–15.4)
WBC: 4.8 10*3/uL (ref 3.4–10.8)

## 2016-08-19 ENCOUNTER — Ambulatory Visit: Payer: Medicare Other | Admitting: Cardiology

## 2016-08-23 ENCOUNTER — Other Ambulatory Visit: Payer: Self-pay | Admitting: *Deleted

## 2016-08-23 ENCOUNTER — Encounter: Payer: Self-pay | Admitting: *Deleted

## 2016-08-23 NOTE — Patient Outreach (Signed)
[facility]) Care Management   08/23/2016  [name] 09-17-1927 [id number]  [name] is an 83 y.o. female  Subjective: Initial home visit with pt, HIPAA verified,  Pt reports she lives alone in apartment and has a friend that comes over every day to assist her and drives her to do errands, out to eat, pt adult children assist her also.  Pt states she checks CBG TID and hs and "blood sugar is good"  Pt states no issues with medications.  Pt states she is aware of safety precautions and uses walker. Pt states she has chronic issues with her legs (edema and sometimes weeping)  Pt states she is going to start wrapping her legs daily with ace wraps to control the edema.  Objective:   Vitals:   08/23/16 1703  BP: 138/64  Pulse: (!) 58  Resp: 18  SpO2: 96%  Weight: 120 lb (54.4 kg)  Height: 1.524 m (5')  CBG ranges in low 100's most of time per pt  ROS  Physical Exam  Constitutional: She is oriented to person, place, and time. She appears well-developed and well-nourished.  HENT:  Head: Normocephalic.  Neck: Normal range of motion. Neck supple.  Cardiovascular: Regular rhythm.   Respiratory: Effort normal and breath sounds normal.  GI: Soft. Bowel sounds are normal.  Musculoskeletal: Normal range of motion. She exhibits edema.  2+ edema lower extremities bil  Neurological: She is alert and oriented to person, place, and time.  Skin: Skin is warm and dry.  Psychiatric: She has a normal mood and affect. Her behavior is normal. Judgment and thought content normal.    Encounter Medications:   Outpatient Encounter Prescriptions as of 08/23/2016  Medication Sig Note  . ACCU-CHEK AVIVA PLUS test strip Test tid. Dx E11.9   . acetaminophen (TYLENOL) 500 MG tablet Take 500 mg by mouth every 4 (four) hours as needed for mild pain.    Marland Kitchen amiodarone (PACERONE) 200 MG tablet TAKE (1/2) TABLET DAILY.   Marland Kitchen aspirin EC 81 MG tablet Take 81 mg by mouth daily with breakfast.     . atorvastatin (LIPITOR) 20 MG tablet TAKE 1 TABLET DAILY IN THE EVENING (Patient taking differently: TAKE 20 mg by mouth at bedtime)   . Azelastine HCl 0.15 % SOLN Place 1 spray into both nostrils 2 (two) times daily as needed (congestion).   . baclofen (LIORESAL) 10 MG tablet TAKE 1/2 TABLET EVERY 8 HOURS AS NEEDED FOR MUSCLE SPASM   . budesonide-formoterol (SYMBICORT) 160-4.5 MCG/ACT inhaler Inhale 1 puff into the lungs 2 (two) times daily. (Patient taking differently: Inhale 1 puff into the lungs 2 (two) times daily as needed (wheezing). )   . cholecalciferol (VITAMIN D) 1000 units tablet Take 1,000 Units by mouth daily.   Marland Kitchen docusate sodium (COLACE) 100 MG capsule Take 100 mg by mouth daily as needed for moderate constipation.    . ferrous sulfate 325 (65 FE) MG tablet TAKE 1 TABLET ONCE DAILY WITH BREAKFAST   . fluticasone (FLONASE) 50 MCG/ACT nasal spray Place 1 spray into both nostrils 2 (two) times daily. (Patient taking differently: Place 1 spray into both nostrils as needed. )   . glucosamine-chondroitin 500-400 MG tablet Take 1 tablet by mouth 2 (two) times daily.   . GuaiFENesin (CHEST CONGESTION RELIEF PO) Take 1-1.5 tablets by mouth 2 (two) times daily.   . hydrocortisone (ANUSOL-HC) 25 MG suppository Place 1 suppository (25 mg total) rectally 2 (two) times daily as needed for  hemorrhoids or itching.   . Insulin Detemir (LEVEMIR FLEXTOUCH) 100 UNIT/ML Pen INJECT 8 units once a day each morning   . Insulin Pen Needle (PEN NEEDLES) 31G X 6 MM MISC Use to administer insulin up to qid   . Ipratropium-Albuterol (COMBIVENT) 20-100 MCG/ACT AERS respimat Inhale 1 puff into the lungs every 6 (six) hours as needed for wheezing or shortness of breath.   . Multiple Vitamins-Minerals (CENTRUM ADULTS) TABS Take 1 tablet by mouth daily with breakfast.   . Multiple Vitamins-Minerals (PRESERVISION AREDS) CAPS Take 1 capsule by mouth daily with breakfast.   . NIFEdipine  (PROCARDIA-XL/ADALAT-CC/NIFEDICAL-XL) 30 MG 24 hr tablet TAKE 1 TABLET ONCE DAILY   . nitroGLYCERIN (NITROSTAT) 0.4 MG SL tablet Place 1 tablet (0.4 mg total) under the tongue every 5 (five) minutes as needed for chest pain.   Marland Kitchen ondansetron (ZOFRAN ODT) 4 MG disintegrating tablet Take 1 tablet (4 mg total) by mouth every 8 (eight) hours as needed for nausea or vomiting.   . OXYGEN Inhale 2.5 L into the lungs daily.   . pantoprazole (PROTONIX) 40 MG tablet TAKE 1 TABLET DAILY   . polyethylene glycol (MIRALAX / GLYCOLAX) packet Take 17 g by mouth 2 (two) times daily as needed for moderate constipation.   . polyethylene glycol powder (GLYCOLAX/MIRALAX) powder Take 17 g by mouth daily as needed. (Patient taking differently: Take 17 g by mouth daily as needed for mild constipation. )   . potassium chloride SA (K-DUR,KLOR-CON) 20 MEQ tablet TAKE 1 TABLET DAILY   . PRODIGY TWIST TOP LANCETS 28G MISC CHECK BLOOD SUGAR UP TO 4 TIMES A DAY   . simethicone (MYLICON) 80 MG chewable tablet Chew 80 mg by mouth at bedtime as needed for flatulence.   . torsemide (DEMADEX) 20 MG tablet Take 40 mg (2 tabs) in morning, 20mg  (1 tab) in the evening   . traMADol (ULTRAM) 50 MG tablet Take 1 tablet (50 mg total) by mouth every 6 (six) hours as needed for severe pain.   . vitamin C (ASCORBIC ACID) 500 MG tablet Take 500 mg by mouth at bedtime.  12/13/2012: qpm  . warfarin (COUMADIN) 4 MG tablet Take 1.5 tablets (6 mg total) by mouth daily.    No facility-administered encounter medications on file as of 08/23/2016.     Functional Status:   In your present state of health, do you have any difficulty performing the following activities: 08/23/2016 08/08/2016  Hearing? Tempie Donning  Vision? Y Y  Difficulty concentrating or making decisions? N N  Walking or climbing stairs? N N  Dressing or bathing? N N  Doing errands, shopping? Tempie Donning  Preparing Food and eating ? N -  Using the Toilet? N -  In the past six months, have you  accidently leaked urine? N -  Do you have problems with loss of bowel control? N -  Managing your Medications? Y -  Managing your Finances? N -  Housekeeping or managing your Housekeeping? Y -  Some recent data might be hidden    Fall/Depression Screening:    PHQ 2/9 Scores 08/23/2016 08/17/2016 07/20/2016 07/15/2016 05/13/2016 05/03/2016 05/02/2016  PHQ - 2 Score 1 1 3 3  0 0 0  PHQ- 9 Score - - 6 6 - - -   Fall Risk  08/23/2016 08/17/2016 07/20/2016 07/15/2016 05/13/2016  Falls in the past year? Yes Yes Yes Yes Yes  Number falls in past yr: 1 1 1 1 2  or more  Injury with Fall? -  No No - Yes  Risk Factor Category  - - - - -  Risk for fall due to : History of fall(s) - - - -  Follow up Falls evaluation completed;Education provided - - - -   Assessment:  RN CM gave [facility] calendar and reviewed with pt, pt is already weighing daily and recording.  RN CM emphasized CHF/ COPD zones/ action plan.  RN CM gave EMMI handouts for pt to review- HF-when to call doctor, HF-How to be sodium smart, HF- about Rest, Exercise and blood pressure.  COPD- When to get help,  COPD- What you can do.  RN CM faxed initial home visit, barrier letter to primary care MD.  [facility] CM Care Plan Problem One   Flowsheet Row Most Recent Value  Care Plan Problem One  Pt high risk for readmission related to CHF  Role Documenting the Problem One  Care Management [facility] for Problem One  Active  [facility] Long Term Goal (31-90 days)  Pt will demonstrate adequate self care for CHF within 90 days  THN Long Term Goal Start Date  08/15/16  Interventions for Problem One Long Term Goal  RN CM gave [facility] calendar and reviewed CHF zones/ action plan  [facility] CM Short Term Goal #1 (0-30 days)  Pt will verbalize CHF zones within 30 days  THN CM Short Term Goal #1 Start Date  08/15/16  Interventions for Short Term Goal #1  RN CM reiterated importance of daily weights, CHF action plan/ zones    [facility] CM Care Plan Problem Two   Flowsheet Row Most  Recent Value  Care Plan Problem Two  Knowledge deficit related to COPD  Role Documenting the Problem Two  Care Management Coordinator  Care Plan for Problem Two  Active  THN CM Short Term Goal #1 (0-30 days)  Pt will verbalize COPD zones/ action plan within 30 days  [facility] CM Short Term Goal #1 Start Date  08/23/16  Interventions for Short Term Goal #2   RN CM gave pt COPD action plan/ zones and reviewed with pt, encouraged pt to call MD early for change in symptoms and health status      Plan: continue weekly transition of care calls See pt for home visit in March Continue CHF, COPD reinforcement  Jacqlyn Larsen [facility], [place] [phone]

## 2016-08-24 NOTE — Addendum Note (Signed)
Addended by: Timmothy Euler on: 08/24/2016 02:24 PM   Modules accepted: Level of Service

## 2016-08-29 ENCOUNTER — Other Ambulatory Visit: Payer: Self-pay | Admitting: *Deleted

## 2016-08-29 NOTE — Patient Outreach (Signed)
RN CM called pt for transition of care week 3, spoke with pt, HIPAA verified, pt reports "doing well" , taking medication as prescribed, CBG 150 this morning, ate cake and ice cream yesterday evening, took insulin as prescribed, weight today 120 pounds, no new issues or concerns reported.  [facility] CM Care Plan Problem One   Flowsheet Row Most Recent Value  Care Plan Problem One  Pt high risk for readmission related to CHF  Role Documenting the Problem One  Care Management Coordinator  Care Plan for Problem One  Active  THN Long Term Goal (31-90 days)  Pt will demonstrate adequate self care for CHF within 90 days  [facility]N Long Term Goal Start Date  08/15/16  Interventions for Problem One Long Term Goal  RN CM reviewed  CHF action plan/ zones  [facility] CM Short Term Goal #1 (0-30 days)  Pt will verbalize CHF zones within 30 days  [facility] CM Short Term Goal #1 Start Date  08/15/16  Interventions for Short Term Goal #1  RN CM reinforced importance of daily weights, CHF action plan/ zones    [facility] CM Care Plan Problem Two   Flowsheet Row Most Recent Value  Care Plan Problem Two  Knowledge deficit related to COPD  Role Documenting the Problem Two  Care Management Coordinator  Care Plan for Problem Two  Active  [facility]N CM Short Term Goal #1 (0-30 days)  Pt will verbalize COPD zones/ action plan within 30 days  [facility] CM Short Term Goal #1 Start Date  08/23/16  Interventions for Short Term Goal #2   RNCM reiterated COPD action plan/ zones, symptom management      PLAN Continue weekly transition of care calls See pt for home visit this month  Jacqlyn Larsen [facility], [place] Coordinator [phone]

## 2016-08-30 ENCOUNTER — Encounter: Payer: Self-pay | Admitting: Cardiology

## 2016-08-30 ENCOUNTER — Ambulatory Visit (INDEPENDENT_AMBULATORY_CARE_PROVIDER_SITE_OTHER): Payer: Medicare Other | Admitting: Cardiology

## 2016-08-30 DIAGNOSIS — I5043 Acute on chronic combined systolic (congestive) and diastolic (congestive) heart failure: Secondary | ICD-10-CM

## 2016-08-30 DIAGNOSIS — E1122 Type 2 diabetes mellitus with diabetic chronic kidney disease: Secondary | ICD-10-CM

## 2016-08-30 DIAGNOSIS — I4510 Unspecified right bundle-branch block: Secondary | ICD-10-CM

## 2016-08-30 DIAGNOSIS — J438 Other emphysema: Secondary | ICD-10-CM | POA: Diagnosis not present

## 2016-08-30 DIAGNOSIS — I739 Peripheral vascular disease, unspecified: Secondary | ICD-10-CM | POA: Diagnosis not present

## 2016-08-30 DIAGNOSIS — I2510 Atherosclerotic heart disease of native coronary artery without angina pectoris: Secondary | ICD-10-CM | POA: Diagnosis not present

## 2016-08-30 DIAGNOSIS — I480 Paroxysmal atrial fibrillation: Secondary | ICD-10-CM | POA: Diagnosis not present

## 2016-08-30 DIAGNOSIS — N183 Chronic kidney disease, stage 3 unspecified: Secondary | ICD-10-CM

## 2016-08-30 DIAGNOSIS — Z9861 Coronary angioplasty status: Secondary | ICD-10-CM | POA: Diagnosis not present

## 2016-08-30 MED ORDER — TORSEMIDE 20 MG PO TABS: ORAL_TABLET | ORAL | 0 refills | Status: DC

## 2016-08-30 NOTE — Assessment & Plan Note (Signed)
Moderate LICA  Disease by doppler Nov 2016

## 2016-08-30 NOTE — Assessment & Plan Note (Signed)
Chronic. 

## 2016-08-30 NOTE — Progress Notes (Signed)
08/30/2016 [name]   1927-10-05  [id number]  Primary Physician Redge Gainer, MD Primary Cardiologist: Dr Gwenlyn Found  HPI:  Pleasant 83 y/o female with a history of remote CAD, PAF on Amiodarone and Coumadin, COPD on home O2, and chronic diastolic failure. She was recently admitted 08/07/16 with CHF. Her discharge weight was 117 lbs which was unchanged from her admission weight but she felt better and her LE edema was improved. During that admission her Torpol was stopped secondary to bradycardia and her Procardia was held secondary to low B/P. She saw her PCP in follow up 08/17/16 and is seeing me today as a post hospital visit. Her daughter accompanied her. The pt's wgt on 08/17/16 was 118 lbs and today is 119 lbs.She has noticed increased LE edema since she saw her PCP 08/17/16.    Current Outpatient Prescriptions  Medication Sig Dispense Refill  . ACCU-CHEK AVIVA PLUS test strip Test tid. Dx E11.9 200 each 2  . acetaminophen (TYLENOL) 500 MG tablet Take 500 mg by mouth every 4 (four) hours as needed for mild pain.     Marland Kitchen amiodarone (PACERONE) 200 MG tablet TAKE (1/2) TABLET DAILY. 15 tablet 0  . aspirin EC 81 MG tablet Take 81 mg by mouth daily with breakfast.     . atorvastatin (LIPITOR) 20 MG tablet TAKE 1 TABLET DAILY IN THE EVENING (Patient taking differently: TAKE 20 mg by mouth at bedtime) 90 tablet 0  . Azelastine HCl 0.15 % SOLN Place 1 spray into both nostrils 2 (two) times daily as needed (congestion).    . baclofen (LIORESAL) 10 MG tablet TAKE 1/2 TABLET EVERY 8 HOURS AS NEEDED FOR MUSCLE SPASM 45 tablet 0  . budesonide-formoterol (SYMBICORT) 160-4.5 MCG/ACT inhaler Inhale 1 puff into the lungs 2 (two) times daily. (Patient taking differently: Inhale 1 puff into the lungs 2 (two) times daily as needed (wheezing). ) 3 Inhaler 2  . cholecalciferol (VITAMIN D) 1000 units tablet Take 1,000 Units by mouth daily.    Marland Kitchen docusate sodium (COLACE) 100 MG capsule Take 100 mg by mouth daily as  needed for moderate constipation.     . ferrous sulfate 325 (65 FE) MG tablet TAKE 1 TABLET ONCE DAILY WITH BREAKFAST 100 tablet 4  . fluticasone (FLONASE) 50 MCG/ACT nasal spray Place 1 spray into both nostrils 2 (two) times daily. (Patient taking differently: Place 1 spray into both nostrils as needed. ) 16 g 6  . glucosamine-chondroitin 500-400 MG tablet Take 1 tablet by mouth 2 (two) times daily.    . GuaiFENesin (CHEST CONGESTION RELIEF PO) Take 1-1.5 tablets by mouth 2 (two) times daily.    . hydrocortisone (ANUSOL-HC) 25 MG suppository Place 1 suppository (25 mg total) rectally 2 (two) times daily as needed for hemorrhoids or itching. 12 suppository 0  . Insulin Detemir (LEVEMIR FLEXTOUCH) 100 UNIT/ML Pen INJECT 8 units once a day each morning 15 mL 6  . Insulin Pen Needle (PEN NEEDLES) 31G X 6 MM MISC Use to administer insulin up to qid 200 each 8  . Ipratropium-Albuterol (COMBIVENT) 20-100 MCG/ACT AERS respimat Inhale 1 puff into the lungs every 6 (six) hours as needed for wheezing or shortness of breath. 3 Inhaler 2  . Multiple Vitamins-Minerals (CENTRUM ADULTS) TABS Take 1 tablet by mouth daily with breakfast.    . Multiple Vitamins-Minerals (PRESERVISION AREDS) CAPS Take 1 capsule by mouth daily with breakfast.    . NIFEdipine (PROCARDIA-XL/ADALAT-CC/NIFEDICAL-XL) 30 MG 24 hr tablet TAKE 1 TABLET ONCE  DAILY 30 tablet 0  . nitroGLYCERIN (NITROSTAT) 0.4 MG SL tablet Place 1 tablet (0.4 mg total) under the tongue every 5 (five) minutes as needed for chest pain. 25 tablet 0  . ondansetron (ZOFRAN ODT) 4 MG disintegrating tablet Take 1 tablet (4 mg total) by mouth every 8 (eight) hours as needed for nausea or vomiting. 12 tablet 3  . OXYGEN Inhale 2.5 L into the lungs daily.    . pantoprazole (PROTONIX) 40 MG tablet TAKE 1 TABLET DAILY 60 tablet 1  . polyethylene glycol (MIRALAX / GLYCOLAX) packet Take 17 g by mouth 2 (two) times daily as needed for moderate constipation. 14 each 0  .  polyethylene glycol powder (GLYCOLAX/MIRALAX) powder Take 17 g by mouth daily as needed. (Patient taking differently: Take 17 g by mouth daily as needed for mild constipation. ) 3350 g 1  . potassium chloride SA (K-DUR,KLOR-CON) 20 MEQ tablet TAKE 1 TABLET DAILY 30 tablet 2  . PRODIGY TWIST TOP LANCETS 28G MISC CHECK BLOOD SUGAR UP TO 4 TIMES A DAY 200 each 2  . simethicone (MYLICON) 80 MG chewable tablet Chew 80 mg by mouth at bedtime as needed for flatulence.    . torsemide (DEMADEX) 20 MG tablet Take 40 mg (2 tabs) two times daily 60 tablet 0  . traMADol (ULTRAM) 50 MG tablet Take 1 tablet (50 mg total) by mouth every 6 (six) hours as needed for severe pain. 15 tablet 0  . vitamin C (ASCORBIC ACID) 500 MG tablet Take 500 mg by mouth at bedtime.     Marland Kitchen warfarin (COUMADIN) 4 MG tablet Take 1.5 tablets (6 mg total) by mouth daily. 45 tablet 1   No current facility-administered medications for this visit.     Allergies  Allergen Reactions  . Atorvastatin Other (See Comments)     muscle aches  . Codeine Nausea And Vomiting  . Morphine Nausea And Vomiting  . Other Other (See Comments)    OPIATES cause nausea and vomiting  . Rosuvastatin Other (See Comments)    muscle aches at high doses, held as of 12/2010 due to aches  . Sulfa Antibiotics Nausea And Vomiting  . Iohexol Other (See Comments)     Desc: unknown reaction; allergic to iodine and contrast     Past Medical History:  Diagnosis Date  . Angina   . Anxiety   . Arthritis    "all over"  . Bleeds easily ([place])   . Bradycardia 08/24/2011  . CAD (coronary artery disease)    Cath in 2006 showed an occluded RCA with left-to-right collaterals & otherwise noncritical CAD with EF=25-30% at that time. EF subsequently improved to normal by 2D ECHO. Cath Aug. 2011 showed unchanged anatomy. > medical therapy recommended  . Carotid artery disease (HCC)    Moderate, right greater than left which we following by duplex ultrasound. [place] 12/05/11 =  RIght Bulb/Prox ICA: Moderate to severe amt of fibrous plaque elevating velocities w/in prox segment of ICA. Consistent w/a 50-69% diameter reduction. Left Bulb/Prox ICA: Moderate amt of fibrous plaque slightly elevating velocities w/in the prox segment of the ICA. Consistent w/a 0-49% diameter reduction.   . Carotid bruit   . CHF (congestive heart failure) ([place])    Hospitalized 08/22/11-08/26/11 with CHF secondary to diastolic dysfunction & hospitilized in 07/2012 with a similar problem. TTE 08/23/11 = normal EF.  Marland Kitchen Chronic lower back pain   . COPD (chronic obstructive pulmonary disease) ([place])    GOLD 4 - PFT 06/08/10  FEV1 0.93 (62%), FEV1% 60, TLC 2.84 (69%0, DLCO 54%, +BD) On home O2.  Marland Kitchen COPD with emphysema ([place])   . Coronary atherosclerosis of unspecified type of vessel, native or graft   . Diastolic dysfunction    Grade II  . Edema   . Fall during current hospitalization    "was at [facility]; fell; transferred to [facility]" (01/13/2015)  . Family history of adverse reaction to anesthesia    "daughter gets PONV"  . GERD (gastroesophageal reflux disease)   . History of stress test 07/02/09   Mild perfusion defect seen in the Basal Inferior region(s). This is consistent with an infarct/scar.  Post-stress EF=56%. Global LV systolic function is normal.  EKG is negative for ischemia.  No significant iscemia detected. Low risk scan.  . Hyperlipidemia   . Hypertension   . Hypoxemic respiratory failure, chronic (HCC)    uses 2.5 liters of oxygen with sleep  . Lower extremity edema    ECHO 07/21/12 = EF 55-60%, performed for TIA. Responding to diurectics. Continue diuresis w/ a goal dry weight of <160lb. Venous Duplex 07/27/11 = Right lower extremity: no evidence of thrombus or thrombophlebitis. Essentially normal right lower extremity venous duplex Doppler evaluation.  . Mitral valve regurgitation    Mild to moderate by ECHO 07/21/12  . Myocardial infarction    "she's had several" (01/13/2015)  . On home  oxygen therapy    "?L; prn" (01/13/2015)  . PAF (paroxysmal atrial fibrillation) (HCC)    In the past, on coumadin  . Pneumonia ~ 2013  . PONV (postoperative nausea and vomiting)   . RBBB (right bundle branch block)    Chronic  . Secondary pulmonary hypertension   . Shortness of breath   . Sleep apnea   . Sleep-related hypoventilation   . Stroke [facility])    "/CT scan; ~ 2014; didn't even know she'd had it"  (01/13/2015)  . Type II diabetes mellitus (HCC)    goal A1C is 8, to avoid hypoglycemia    Social History   Social History  . Marital status: Widowed    Spouse name: N/A  . Number of children: N/A  . Years of education: N/A   Occupational History  . Not on file.   Social History Main Topics  . Smoking status: Former Smoker    Packs/day: 1.50    Years: 15.00    Types: Cigarettes  . Smokeless tobacco: Never Used     Comment: Smoked 1.5 packs per day for 15 years, quit in 1995.  Marland Kitchen Alcohol use 3.6 oz/week    6 Glasses of wine per week     Comment: 01/13/2015 "couple glasses of wine maybe 3 days/wk"  . Drug use: No  . Sexual activity: No   Other Topics Concern  . Not on file   Social History Narrative   Widowed, lives with daughter     Family History  Problem Relation Age of Onset  . Cancer Mother     Skin  . Heart disease Mother   . Emphysema Father   . Heart disease Father   . Congestive Heart Failure Father   . Heart disease Brother   . Heart disease Brother   . Early death Sister      Review of Systems: General: negative for chills, fever, night sweats or weight changes.  Cardiovascular: negative for chest pain, dyspnea on exertion, edema, orthopnea, palpitations, paroxysmal nocturnal dyspnea or shortness of breath Dermatological: negative for rash Respiratory: negative for cough or  wheezing Urologic: negative for hematuria Abdominal: negative for nausea, vomiting, diarrhea, bright red blood per rectum, melena, or hematemesis Neurologic: negative for  visual changes, syncope, or dizziness All other systems reviewed and are otherwise negative except as noted above.    Blood pressure (!) 159/86, pulse 81, height 5' (1.524 m), weight 119 lb (54 kg), SpO2 90 %.  General appearance: alert, cooperative, cachectic, no distress and frail, on O2 Neck: no carotid bruit and no JVD Lungs: decreased breath sounds-no wheezing, few basilar rales Heart: regular rate and rhythm and decreased heart sounds Extremities: 1-2+ pre tibial pitting edema  Skin: pale, cool, dry Neurologic: Grossly normal  Echo 08/17/16- Study Conclusions  - Left ventricle: The cavity size was normal. There was moderate   concentric hypertrophy. Systolic function was normal. The   estimated ejection fraction was in the range of 55% to 60%. Wall   motion was normal; there were no regional wall motion   abnormalities. There was a reduced contribution of atrial   contraction to ventricular filling, due to increased ventricular   diastolic pressure or atrial contractile dysfunction. Doppler   parameters are consistent with a reversible restrictive pattern,   indicative of decreased left ventricular diastolic compliance   and/or increased left atrial pressure (grade 3 diastolic   dysfunction). Doppler parameters are consistent with both   elevated ventricular end-diastolic filling pressure and elevated   left atrial filling pressure. - Aortic valve: Valve mobility was restricted. - Mitral valve: Calcified annulus. Mildly thickened, mildly   calcified leaflets . There was mild regurgitation. - Left atrium: The atrium was moderately dilated. - Right atrium: The atrium was moderately dilated. - Pulmonary arteries: PA peak pressure: 38 mm Hg (S).  Impressions:  - The right ventricular systolic pressure was increased consistent   with mild pulmonary hypertension.  ASSESSMENT AND PLAN:   CKD stage 3 secondary to diabetes (HCC) Last SCr 1.2  COPD with emphysema  (HCC) On chronic O2, GOLD 4  PAF (paroxysmal atrial fibrillation) (HCC) PAF, on Amiodarone/Coumadin-Toprol stopped secondary to bradycardia  PVD (peripheral vascular disease), moderate carotid and LSCA disease Moderate LICA  Disease by doppler Nov 2016  RBBB Chronic  CAD S/P percutaneous coronary angioplasty remote RCA PCI 2006, subsequently noted to be occluded, last cath 8/11  Acute on chronic combined systolic and diastolic CHF (congestive heart failure) ([place]) Seen in the office today after her recent admission for acute on chronic diastolic CHF   PLAN  Mrs Ned has end stage COPD and chronic diastolic dysfunction. She doesn't tolerate much fluctuation in her volume status. I asked her to increase her Torsemide to 40 mg BID and to resume her Procardia. I'll see her back in 10-14 days and get a BMP then.   Kerin Ransom PA-C 08/30/2016 12:44 PM

## 2016-08-30 NOTE — Assessment & Plan Note (Signed)
remote RCA PCI 2006, subsequently noted to be occluded, last cath 8/11

## 2016-08-30 NOTE — Assessment & Plan Note (Signed)
On chronic O2, GOLD 4

## 2016-08-30 NOTE — Assessment & Plan Note (Signed)
PAF, on Amiodarone/Coumadin-Toprol stopped secondary to bradycardia

## 2016-08-30 NOTE — Patient Instructions (Signed)
Medication Instructions:  INCREASE Torsemide to 40mg  two times daily.   Make sure you are taking your Procardia 30mg  (1 tablet) one time daily.   Follow-Up: Your physician recommends that you schedule a follow-up appointment in: 10-14 days with Kerin Ransom PA   Any Other Special Instructions Will Be Listed Below (If Applicable).     If you need a refill on your cardiac medications before your next appointment, please call your pharmacy.

## 2016-08-30 NOTE — Assessment & Plan Note (Signed)
Last SCr 1.2

## 2016-08-30 NOTE — Assessment & Plan Note (Signed)
Seen in the office today after her recent admission for acute on chronic diastolic CHF

## 2016-08-31 ENCOUNTER — Encounter: Payer: Medicare Other | Admitting: Pharmacist

## 2016-09-01 ENCOUNTER — Ambulatory Visit (INDEPENDENT_AMBULATORY_CARE_PROVIDER_SITE_OTHER): Payer: Medicare Other | Admitting: Pharmacist

## 2016-09-01 DIAGNOSIS — I480 Paroxysmal atrial fibrillation: Secondary | ICD-10-CM | POA: Diagnosis not present

## 2016-09-01 LAB — COAGUCHEK XS/INR WAIVED
INR: 4.3 — ABNORMAL HIGH (ref 0.9–1.1)
PROTHROMBIN TIME: 51.5 s

## 2016-09-01 MED ORDER — WARFARIN SODIUM 4 MG PO TABS: ORAL_TABLET | ORAL | 0 refills | Status: DC

## 2016-09-01 MED ORDER — TORSEMIDE 20 MG PO TABS: ORAL_TABLET | ORAL | 0 refills | Status: DC

## 2016-09-01 NOTE — Progress Notes (Signed)
Patient ID: [name], female   DOB: 07-21-1927, 83 y.o.   MRN: [id number]   Subjective:    CC: atrial fibrillation / chronic anticoagulation   Indication: atrial fibrillation Bleeding signs/symptoms: blood blister noted on left great toe - dime -sized Thromboembolic signs/symptoms: None  Missed Coumadin doses: None Warfarin was increased to 6mg  daily 02/21/218 Medication changes: yes - per notes from Dr Rosalyn Gess - metoprolol was stopped 08/30/16 and torsemide was suppose to be increased to 40mg  bid.  However called [facility] and they have not received Rx with changes so these changes are not reflected in her weekly pill packages yet.   Dietary changes: no   Objective:    INR Today: 4.3 Current dose: 6mg  daily   Assessment:    Subtherapeutic INR for goal of 2-3   Uncontrolled type 2 DM requiring insulin therapy - elevated BG per CGM  CHF / edema - improving   Plan:    1. No warfarin for 2 days.  Then decrease warfarin to 4mg  on Mondays and fridays.  Take 6mg  all other days 2. Next INR: 03/21/218 (would like to check sooner but patient does not drive herself and this was soonest someone could bring her back 3.  Notified [place] of medications changes for warfarin, metoprolol and torsemide.  They will redo medication packets for her for this week.

## 2016-09-01 NOTE — Patient Instructions (Signed)
Anticoagulation Dose Instructions as of 09/01/2016      [name] Tue Wed Thu Fri Sat   09/01/2016 thru 09/03/2016      HOLD  HOLD 6 mg   Continue like this until next appointment 6 mg 4 mg 6 mg 6 mg 6 mg 4 mg 6 mg    Description   No warfarin for 2 days.  Then decrease warfarin dose to 1 tablet on Mondays and Fridays and 1 and 1/2 tablet all other days.    INR was 4.3 today (too think)

## 2016-09-05 ENCOUNTER — Other Ambulatory Visit: Payer: Self-pay | Admitting: *Deleted

## 2016-09-05 NOTE — Patient Outreach (Signed)
[place] [facility]) Care Management  09/05/2016  [name] Aug 15, 1927 [id number]   Patient triggered Red on EMMI heart failure dashboard, notification sent to Jacqlyn Larsen, RN

## 2016-09-05 NOTE — Patient Outreach (Signed)
Telephone call to pt for transition of care week 4 and Red EMMI alerts over the weekend, spoke with pt, HIPAA verified, pt reports she is doing fine today and states " I have trouble with that phone call stuff and would like to cancel it"  Pt reports weight today 117.8 pounds,  CBG 66, then ate breakfast and now 102.  Pt states "swelling ok, may have a little bit but no more than usual"  Pt states has dyspnea mostly with exertion at times.  No new concerns voiced today.  Rn CM sent In Basket to [facility] CMA with request from pt to remove her from [facility] program.  [facility] CM Care Plan Problem One   Flowsheet Row Most Recent Value  Care Plan Problem One  Pt high risk for readmission related to CHF  Role Documenting the Problem One  Care Management [place] for Problem One  Active  [facility] Long Term Goal (31-90 days)  Pt will demonstrate adequate self care for CHF within 90 days  [facility] Long Term Goal Start Date  08/15/16  Interventions for Problem One Long Term Goal  RN CM reinforced  CHF action plan/ zones, reviewed Red EMMI alerts  [facility] CM Short Term Goal #1 (0-30 days)  Pt will verbalize CHF zones within 30 days  [facility] CM Short Term Goal #1 Start Date  08/15/16  Interventions for Short Term Goal #1  RN CM reinforced importance of daily weights, CHF action plan/ zones    [facility] CM Care Plan Problem Two   Flowsheet Row Most Recent Value  Care Plan Problem Two  Knowledge deficit related to COPD  Role Documenting the Problem Two  Care Management Coordinator  Care Plan for Problem Two  Active  [facility] CM Short Term Goal #1 (0-30 days)  Pt will verbalize COPD zones/ action plan within 30 days  [facility] CM Short Term Goal #1 Start Date  08/23/16  Interventions for Short Term Goal #2   RNCM reinforced COPD action plan/ zones, symptom management      PLAN See pt for home visit next week  Jacqlyn Larsen [facility], [place] Coordinator [phone]

## 2016-09-06 ENCOUNTER — Other Ambulatory Visit: Payer: Self-pay | Admitting: Family Medicine

## 2016-09-06 ENCOUNTER — Other Ambulatory Visit: Payer: Self-pay | Admitting: Pulmonary Disease

## 2016-09-07 NOTE — Telephone Encounter (Signed)
These should be filled by cardiology.

## 2016-09-07 NOTE — Telephone Encounter (Signed)
Pt is requesting refills on procardia-xl 30mg  and pacerone 200mg . Last refilled on 08-16-16. VS please advise on refills. Thanks.

## 2016-09-08 ENCOUNTER — Encounter: Payer: Self-pay | Admitting: Family Medicine

## 2016-09-08 ENCOUNTER — Ambulatory Visit (INDEPENDENT_AMBULATORY_CARE_PROVIDER_SITE_OTHER): Payer: Medicare Other | Admitting: Family Medicine

## 2016-09-08 VITALS — BP 138/72 | HR 76 | Temp 97.4°F | Ht 60.0 in | Wt 119.0 lb

## 2016-09-08 DIAGNOSIS — T148XXA Other injury of unspecified body region, initial encounter: Secondary | ICD-10-CM

## 2016-09-08 DIAGNOSIS — S90422A Blister (nonthermal), left great toe, initial encounter: Secondary | ICD-10-CM | POA: Diagnosis not present

## 2016-09-08 LAB — COAGUCHEK XS/INR WAIVED
INR: 1.6 — ABNORMAL HIGH (ref 0.9–1.1)
Prothrombin Time: 19.6 s

## 2016-09-08 NOTE — Progress Notes (Signed)
BP 138/72   Pulse 76   Temp 97.4 F (36.3 C) (Oral)   Ht 5' (1.524 m)   Wt 119 lb (54 kg)   BMI 23.24 kg/m    Subjective:    Patient ID: [name], female    DOB: Nov 11, 1927, 83 y.o.   MRN: [id number]  HPI: [name] is a 83 y.o. female presenting on 09/08/2016 for Blood blister on left great toe   HPI Blood blister on left great toe Patient comes in with a blood blister on her left great toe on the medial aspect that has been increasing over the past week and a half. It started out as a small spot but has increased over this time. Her INR one week ago was 4.1. The blister has not popped and she denies any bleeding anywhere else. We rechecked her INR today is 1.6 so it is slightly thick but we will leave it there for now because of the blood blister until he can reduce in size. We did discuss wearing more, feeling loose shoes in the meantime. She denies any redness or warmth or swelling or pain associated with it.  Relevant past medical, surgical, family and social history reviewed and updated as indicated. Interim medical history since our last visit reviewed. Allergies and medications reviewed and updated.  Review of Systems  Constitutional: Negative for chills and fever.  Respiratory: Negative for chest tightness and shortness of breath.   Cardiovascular: Negative for chest pain and leg swelling.  Gastrointestinal: Negative for blood in stool.  Genitourinary: Negative for difficulty urinating and hematuria.  Musculoskeletal: Negative for back pain and gait problem.  Skin: Negative for color change and rash.  Neurological: Negative for light-headedness and headaches.  Psychiatric/Behavioral: Negative for agitation and behavioral problems.  All other systems reviewed and are negative.   Per HPI unless specifically indicated above        Objective:    BP 138/72   Pulse 76   Temp 97.4 F (36.3 C) (Oral)   Ht 5' (1.524 m)   Wt 119 lb (54 kg)   BMI 23.24  kg/m   Wt Readings from Last 3 Encounters:  09/08/16 119 lb (54 kg)  08/30/16 119 lb (54 kg)  08/23/16 120 lb (54.4 kg)    Physical Exam  Constitutional: She is oriented to person, place, and time. She appears well-developed and well-nourished. No distress.  Eyes: Conjunctivae are normal.  Cardiovascular: Normal rate, regular rhythm, normal heart sounds and intact distal pulses.   No murmur heard. Pulmonary/Chest: Effort normal and breath sounds normal. No respiratory distress. She has no wheezes.  Musculoskeletal: Normal range of motion. She exhibits no edema or tenderness.  Neurological: She is alert and oriented to person, place, and time. Coordination normal.  Skin: Skin is warm and dry. Lesion (0.25 cm blister that is filled with dark red colored blood in left great toe on the lateral upper aspect. No signs of erythema or drainage) noted. No rash noted. She is not diaphoretic.  Psychiatric: She has a normal mood and affect. Her behavior is normal.  Nursing note and vitals reviewed.  INR was 4.3 last week, is 1.6 this week, will let it run thin and not change her dosing today. Follow up with Tammy in 1 week.    Assessment & Plan:   Problem List Items Addressed This Visit    None    Visit Diagnoses    Blood blister    -  Primary  INR is 1.6 today, will let her stay at this level and run a little thick and keep the doses like it is currently.   Relevant Orders   CoaguChek XS/INR Waived    Will leave her blood little thicker today until a week from now so that the blister does not bleed more and increase in size or burst.   Follow up plan: Return if symptoms worsen or fail to improve.  Counseling provided for all of the vaccine components Orders Placed This Encounter  Procedures  . CoaguChek XS/INR [place], MD [facility] 09/08/2016, 4:18 PM

## 2016-09-09 ENCOUNTER — Other Ambulatory Visit: Payer: Self-pay

## 2016-09-09 MED ORDER — AMIODARONE HCL 200 MG PO TABS: 100.0000 mg | ORAL_TABLET | Freq: Every day | ORAL | 6 refills | Status: AC

## 2016-09-09 MED ORDER — NIFEDIPINE ER OSMOTIC RELEASE 30 MG PO TB24: 30.0000 mg | ORAL_TABLET | Freq: Every day | ORAL | 6 refills | Status: AC

## 2016-09-10 DIAGNOSIS — J449 Chronic obstructive pulmonary disease, unspecified: Secondary | ICD-10-CM | POA: Diagnosis not present

## 2016-09-10 DIAGNOSIS — I259 Chronic ischemic heart disease, unspecified: Secondary | ICD-10-CM | POA: Diagnosis not present

## 2016-09-12 ENCOUNTER — Other Ambulatory Visit: Payer: Self-pay | Admitting: Pharmacist

## 2016-09-14 ENCOUNTER — Ambulatory Visit (INDEPENDENT_AMBULATORY_CARE_PROVIDER_SITE_OTHER): Payer: Medicare Other | Admitting: Pharmacist

## 2016-09-14 DIAGNOSIS — I480 Paroxysmal atrial fibrillation: Secondary | ICD-10-CM | POA: Diagnosis not present

## 2016-09-14 DIAGNOSIS — N183 Chronic kidney disease, stage 3 (moderate): Secondary | ICD-10-CM

## 2016-09-14 DIAGNOSIS — H40033 Anatomical narrow angle, bilateral: Secondary | ICD-10-CM | POA: Diagnosis not present

## 2016-09-14 DIAGNOSIS — E1122 Type 2 diabetes mellitus with diabetic chronic kidney disease: Secondary | ICD-10-CM

## 2016-09-14 DIAGNOSIS — H04123 Dry eye syndrome of bilateral lacrimal glands: Secondary | ICD-10-CM | POA: Diagnosis not present

## 2016-09-14 LAB — GLUCOSE HEMOCUE WAIVED: GLU HEMOCUE WAIVED: 171 mg/dL — AB (ref 65–99)

## 2016-09-14 LAB — COAGUCHEK XS/INR WAIVED
INR: 2.2 — ABNORMAL HIGH (ref 0.9–1.1)
Prothrombin Time: 25.9 s

## 2016-09-14 NOTE — Addendum Note (Signed)
Addended by: Earlene Plater on: 09/14/2016 03:24 PM   Modules accepted: Orders

## 2016-09-15 ENCOUNTER — Other Ambulatory Visit: Payer: Self-pay | Admitting: *Deleted

## 2016-09-15 ENCOUNTER — Encounter: Payer: Self-pay | Admitting: *Deleted

## 2016-09-15 NOTE — Patient Outreach (Signed)
[place] [facility]) Care Management   09/15/2016  [name] 10-Aug-1927 [id number]  [name] is an 83 y.o. female  Subjective: Routine home visit with pt, HIPAA verified, pt reports " I'm doing better, feeling a little better" Pt states she has appoinment on 09/20/16 with Dr. Collie Siad.  Pt continues to have assistance in the home and likes to go out and eat lunch.  Pt continues with daily weights.  Objective:   Vitals:   09/15/16 1144  BP: 128/62  Pulse: (!) 56  Resp: 16  SpO2: 99%  Weight: 117 lb (53.1 kg)  CBG 135 today ROS  Physical Exam  Constitutional: She is oriented to person, place, and time. She appears well-developed and well-nourished.  HENT:  Head: Normocephalic.  Neck: Normal range of motion. Neck supple.  Cardiovascular: Normal rate and regular rhythm.   Respiratory: Effort normal and breath sounds normal.  GI: Soft. Bowel sounds are normal.  Musculoskeletal: Normal range of motion. She exhibits edema.  1-2+ edema lower extremities bil Legs feel tight, slightly hard to touch  Neurological: She is alert and oriented to person, place, and time.  Skin: Skin is warm and dry.  Psychiatric: She has a normal mood and affect. Her behavior is normal. Judgment and thought content normal.    Encounter Medications:   Outpatient Encounter Prescriptions as of 09/15/2016  Medication Sig  . ACCU-CHEK AVIVA PLUS test strip Test tid. Dx E11.9  . acetaminophen (TYLENOL) 500 MG tablet Take 500 mg by mouth every 4 (four) hours as needed for mild pain.   Marland Kitchen amiodarone (PACERONE) 200 MG tablet Take 0.5 tablets (100 mg total) by mouth daily.  Marland Kitchen aspirin EC 81 MG tablet Take 81 mg by mouth daily with breakfast.   . atorvastatin (LIPITOR) 20 MG tablet TAKE 1 TABLET DAILY IN THE EVENING  . Azelastine HCl 0.15 % SOLN Place 1 spray into both nostrils 2 (two) times daily as needed (congestion).  . baclofen (LIORESAL) 10 MG tablet TAKE 1/2 TABLET EVERY 8 HOURS AS NEEDED FOR  MUSCLE SPASM  . budesonide-formoterol (SYMBICORT) 160-4.5 MCG/ACT inhaler Inhale 1 puff into the lungs 2 (two) times daily. (Patient taking differently: Inhale 1 puff into the lungs 2 (two) times daily as needed (wheezing). )  . cholecalciferol (VITAMIN D) 1000 units tablet Take 1,000 Units by mouth daily.  Marland Kitchen docusate sodium (COLACE) 100 MG capsule Take 100 mg by mouth daily as needed for moderate constipation.   . ferrous sulfate 325 (65 FE) MG tablet TAKE 1 TABLET ONCE DAILY WITH BREAKFAST  . fluticasone (FLONASE) 50 MCG/ACT nasal spray Place 1 spray into both nostrils 2 (two) times daily. (Patient taking differently: Place 1 spray into both nostrils as needed. )  . glucosamine-chondroitin 500-400 MG tablet Take 1 tablet by mouth 2 (two) times daily.  . GuaiFENesin (CHEST CONGESTION RELIEF PO) Take 1-1.5 tablets by mouth 2 (two) times daily.  . hydrocortisone (ANUSOL-HC) 25 MG suppository Place 1 suppository (25 mg total) rectally 2 (two) times daily as needed for hemorrhoids or itching.  . Insulin Detemir (LEVEMIR FLEXTOUCH) 100 UNIT/ML Pen INJECT 8 units once a day each morning  . Insulin Pen Needle (PEN NEEDLES) 31G X 6 MM MISC Use to administer insulin up to qid  . Ipratropium-Albuterol (COMBIVENT) 20-100 MCG/ACT AERS respimat Inhale 1 puff into the lungs every 6 (six) hours as needed for wheezing or shortness of breath.  . Multiple Vitamins-Minerals (CENTRUM ADULTS) TABS Take 1 tablet by mouth daily with breakfast.  .  Multiple Vitamins-Minerals (PRESERVISION AREDS) CAPS Take 1 capsule by mouth daily with breakfast.  . nitroGLYCERIN (NITROSTAT) 0.4 MG SL tablet Place 1 tablet (0.4 mg total) under the tongue every 5 (five) minutes as needed for chest pain.  Marland Kitchen ondansetron (ZOFRAN ODT) 4 MG disintegrating tablet Take 1 tablet (4 mg total) by mouth every 8 (eight) hours as needed for nausea or vomiting.  . OXYGEN Inhale 2.5 L into the lungs daily.  . polyethylene glycol (MIRALAX / GLYCOLAX) packet  Take 17 g by mouth 2 (two) times daily as needed for moderate constipation.  . potassium chloride SA (K-DUR,KLOR-CON) 20 MEQ tablet TAKE 1 TABLET DAILY  . PRODIGY TWIST TOP LANCETS 28G MISC CHECK BLOOD SUGAR UP TO 4 TIMES A DAY  . simethicone (MYLICON) 80 MG chewable tablet Chew 80 mg by mouth at bedtime as needed for flatulence.  . torsemide (DEMADEX) 20 MG tablet Take 40 mg (2 tabs) two times daily  . traMADol (ULTRAM) 50 MG tablet Take 1 tablet (50 mg total) by mouth every 6 (six) hours as needed for severe pain.  . vitamin C (ASCORBIC ACID) 500 MG tablet Take 500 mg by mouth at bedtime.   Marland Kitchen warfarin (COUMADIN) 4 MG tablet No warfarin 3/9 or 3/10 (pt's family to removed from packet). Then decrease to 1 tab Mon and Fri.  1.5 tablets all other days.  Marland Kitchen NIFEdipine (PROCARDIA-XL/ADALAT-CC/NIFEDICAL-XL) 30 MG 24 hr tablet Take 1 tablet (30 mg total) by mouth daily.  . pantoprazole (PROTONIX) 40 MG tablet TAKE 1 TABLET DAILY  . [DISCONTINUED] polyethylene glycol powder (GLYCOLAX/MIRALAX) powder Take 17 g by mouth daily as needed.   No facility-administered encounter medications on file as of 09/15/2016.     Functional Status:   In your present state of health, do you have any difficulty performing the following activities: 08/23/2016 08/08/2016  Hearing? Tempie Donning  Vision? Y Y  Difficulty concentrating or making decisions? N N  Walking or climbing stairs? N N  Dressing or bathing? N N  Doing errands, shopping? Tempie Donning  Preparing Food and eating ? N -  Using the Toilet? N -  In the past six months, have you accidently leaked urine? N -  Do you have problems with loss of bowel control? N -  Managing your Medications? Y -  Managing your Finances? N -  Housekeeping or managing your Housekeeping? Y -  Some recent data might be hidden    Fall/Depression Screening:    PHQ 2/9 Scores 09/08/2016 08/23/2016 08/17/2016 07/20/2016 07/15/2016 05/13/2016 05/03/2016  PHQ - 2 Score 0 1 1 3 3  0 0  PHQ- 9 Score - - - 6  6 - -    Assessment:  Pt wraps legs 2-3 times day, takes ace bandages on and off, wears them at night.  RN CM talked with pt about applying the wraps in the morning and wearing them most of the day and removing wraps at night, reinforced not to wrap up into the bend of knee area and start wrapping with the foot, pt verbalizes understanding but reports she does not like compression hose and will not wear the hose and will try to do better with the wraps. RN CM reinforced CHF action plan, safety precautions.  Plan: see pt for home visit in April Assess weight, CBG  Jacqlyn Larsen [facility], [place] [phone]

## 2016-09-20 ENCOUNTER — Encounter: Payer: Self-pay | Admitting: Cardiology

## 2016-09-20 ENCOUNTER — Ambulatory Visit (INDEPENDENT_AMBULATORY_CARE_PROVIDER_SITE_OTHER): Payer: Medicare Other | Admitting: Cardiology

## 2016-09-20 VITALS — BP 130/76 | HR 66 | Ht 60.0 in | Wt 116.0 lb

## 2016-09-20 DIAGNOSIS — I2510 Atherosclerotic heart disease of native coronary artery without angina pectoris: Secondary | ICD-10-CM | POA: Diagnosis not present

## 2016-09-20 DIAGNOSIS — I4510 Unspecified right bundle-branch block: Secondary | ICD-10-CM | POA: Diagnosis not present

## 2016-09-20 DIAGNOSIS — E1122 Type 2 diabetes mellitus with diabetic chronic kidney disease: Secondary | ICD-10-CM

## 2016-09-20 DIAGNOSIS — N183 Chronic kidney disease, stage 3 unspecified: Secondary | ICD-10-CM

## 2016-09-20 DIAGNOSIS — J438 Other emphysema: Secondary | ICD-10-CM

## 2016-09-20 DIAGNOSIS — I5043 Acute on chronic combined systolic (congestive) and diastolic (congestive) heart failure: Secondary | ICD-10-CM | POA: Diagnosis not present

## 2016-09-20 DIAGNOSIS — Z9861 Coronary angioplasty status: Secondary | ICD-10-CM

## 2016-09-20 DIAGNOSIS — I739 Peripheral vascular disease, unspecified: Secondary | ICD-10-CM

## 2016-09-20 DIAGNOSIS — I480 Paroxysmal atrial fibrillation: Secondary | ICD-10-CM | POA: Diagnosis not present

## 2016-09-20 LAB — BASIC METABOLIC PANEL
BUN: 23 mg/dL (ref 7–25)
CO2: 34 mmol/L — ABNORMAL HIGH (ref 20–31)
Calcium: 10 mg/dL (ref 8.6–10.4)
Chloride: 98 mmol/L (ref 98–110)
Creat: 1.3 mg/dL — ABNORMAL HIGH (ref 0.60–0.88)
Glucose, Bld: 103 mg/dL — ABNORMAL HIGH (ref 65–99)
Potassium: 4.5 mmol/L (ref 3.5–5.3)
Sodium: 142 mmol/L (ref 135–146)

## 2016-09-20 NOTE — Progress Notes (Signed)
09/20/2016 [name]   Oct 01, 1927  [id number]  Primary Physician Redge Gainer, MD Primary [facility] ([facility] requested switch from Dr Gwenlyn Found secondary to convenience)  HPI:  83 y/o female with a history of remote CAD, PAF on Amiodarone and Coumadin, COPD on home O2, and chronic diastolic failure. She was admitted 08/07/16 with CHF. Her wgt fluctuates between 115-119 lbs. During her admission in Feb her Torpol was stopped secondary to bradycardia and her Procardia was held secondary to low B/P. I saw her back in follow up 08/30/16 and her wgt was up to 119 lbs and she had noticed some increased LE edema. I increased her Torsemide to 40 mg BID and she is seen back today in follow up. She is doing better, less edema. She did request she change to the [place] office as this is closer for her. I discussed this was Dr Gwenlyn Found who has followed her in the past and he is in agreement.     Current Outpatient Prescriptions  Medication Sig Dispense Refill  . ACCU-CHEK AVIVA PLUS test strip Test tid. Dx E11.9 200 each 2  . acetaminophen (TYLENOL) 500 MG tablet Take 500 mg by mouth every 4 (four) hours as needed for mild pain.     Marland Kitchen amiodarone (PACERONE) 200 MG tablet Take 0.5 tablets (100 mg total) by mouth daily. 15 tablet 6  . aspirin EC 81 MG tablet Take 81 mg by mouth daily with breakfast.     . atorvastatin (LIPITOR) 20 MG tablet TAKE 1 TABLET DAILY IN THE EVENING 90 tablet 0  . Azelastine HCl 0.15 % SOLN Place 1 spray into both nostrils 2 (two) times daily as needed (congestion).    . baclofen (LIORESAL) 10 MG tablet TAKE 1/2 TABLET EVERY 8 HOURS AS NEEDED FOR MUSCLE SPASM 45 tablet 0  . budesonide-formoterol (SYMBICORT) 160-4.5 MCG/ACT inhaler Inhale 1 puff into the lungs 2 (two) times daily. (Patient taking differently: Inhale 1 puff into the lungs 2 (two) times daily as needed (wheezing). ) 3 Inhaler 2  . cholecalciferol (VITAMIN D) 1000 units tablet Take 1,000 Units by mouth  daily.    Marland Kitchen docusate sodium (COLACE) 100 MG capsule Take 100 mg by mouth daily as needed for moderate constipation.     . ferrous sulfate 325 (65 FE) MG tablet TAKE 1 TABLET ONCE DAILY WITH BREAKFAST 100 tablet 4  . fluticasone (FLONASE) 50 MCG/ACT nasal spray Place 1 spray into both nostrils 2 (two) times daily. (Patient taking differently: Place 1 spray into both nostrils as needed. ) 16 g 6  . glucosamine-chondroitin 500-400 MG tablet Take 1 tablet by mouth 2 (two) times daily.    . GuaiFENesin (CHEST CONGESTION RELIEF PO) Take 1-1.5 tablets by mouth 2 (two) times daily.    . hydrocortisone (ANUSOL-HC) 25 MG suppository Place 1 suppository (25 mg total) rectally 2 (two) times daily as needed for hemorrhoids or itching. 12 suppository 0  . Insulin Detemir (LEVEMIR FLEXTOUCH) 100 UNIT/ML Pen INJECT 8 units once a day each morning 15 mL 6  . Insulin Pen Needle (PEN NEEDLES) 31G X 6 MM MISC Use to administer insulin up to qid 200 each 8  . Ipratropium-Albuterol (COMBIVENT) 20-100 MCG/ACT AERS respimat Inhale 1 puff into the lungs every 6 (six) hours as needed for wheezing or shortness of breath. 3 Inhaler 2  . Multiple Vitamins-Minerals (CENTRUM ADULTS) TABS Take 1 tablet by mouth daily with breakfast.    . Multiple Vitamins-Minerals (PRESERVISION AREDS) CAPS  Take 1 capsule by mouth daily with breakfast.    . NIFEdipine (PROCARDIA-XL/ADALAT-CC/NIFEDICAL-XL) 30 MG 24 hr tablet Take 1 tablet (30 mg total) by mouth daily. 30 tablet 6  . nitroGLYCERIN (NITROSTAT) 0.4 MG SL tablet Place 1 tablet (0.4 mg total) under the tongue every 5 (five) minutes as needed for chest pain. 25 tablet 0  . ondansetron (ZOFRAN ODT) 4 MG disintegrating tablet Take 1 tablet (4 mg total) by mouth every 8 (eight) hours as needed for nausea or vomiting. 12 tablet 3  . OXYGEN Inhale 2.5 L into the lungs daily.    . pantoprazole (PROTONIX) 40 MG tablet TAKE 1 TABLET DAILY 60 tablet 1  . polyethylene glycol (MIRALAX / GLYCOLAX)  packet Take 17 g by mouth 2 (two) times daily as needed for moderate constipation. 14 each 0  . potassium chloride SA (K-DUR,KLOR-CON) 20 MEQ tablet TAKE 1 TABLET DAILY 30 tablet 2  . PRODIGY TWIST TOP LANCETS 28G MISC CHECK BLOOD SUGAR UP TO 4 TIMES A DAY 200 each 2  . simethicone (MYLICON) 80 MG chewable tablet Chew 80 mg by mouth at bedtime as needed for flatulence.    . torsemide (DEMADEX) 20 MG tablet Take 40 mg (2 tabs) two times daily 60 tablet 1  . traMADol (ULTRAM) 50 MG tablet Take 1 tablet (50 mg total) by mouth every 6 (six) hours as needed for severe pain. 15 tablet 0  . vitamin C (ASCORBIC ACID) 500 MG tablet Take 500 mg by mouth at bedtime.     Marland Kitchen warfarin (COUMADIN) 4 MG tablet No warfarin 3/9 or 3/10 (pt's family to removed from packet). Then decrease to 1 tab Mon and Fri.  1.5 tablets all other days. 45 tablet 0   No current facility-administered medications for this visit.     Allergies  Allergen Reactions  . Atorvastatin Other (See Comments)     muscle aches  . Codeine Nausea And Vomiting  . Morphine Nausea And Vomiting  . Other Other (See Comments)    OPIATES cause nausea and vomiting  . Rosuvastatin Other (See Comments)    muscle aches at high doses, held as of 12/2010 due to aches  . Sulfa Antibiotics Nausea And Vomiting  . Iohexol Other (See Comments)     Desc: unknown reaction; allergic to iodine and contrast     Past Medical History:  Diagnosis Date  . Anxiety   . Bradycardia    Torpol stopped  . CAD (coronary artery disease)    Cath in 2006 showed an occluded RCA with left-to-right collaterals & otherwise noncritical CAD with EF=25-30% at that time. EF subsequently improved to normal by 2D ECHO. Cath Aug. 2011 showed unchanged anatomy. > medical therapy recommended  . Carotid artery disease (HCC)    Moderate, right greater than left which we following by duplex ultrasound. [place] 12/05/11 = RIght Bulb/Prox ICA: Moderate to severe amt of fibrous plaque elevating  velocities w/in prox segment of ICA. Consistent w/a 50-69% diameter reduction. Left Bulb/Prox ICA: Moderate amt of fibrous plaque slightly elevating velocities w/in the prox segment of the ICA. Consistent w/a 0-49% diameter reduction.   . Chronic lower back pain   . COPD (chronic obstructive pulmonary disease) (HCC)    GOLD 4 - PFT 06/08/10 FEV1 0.93 (62%), FEV1% 60, TLC 2.84 (69%0, DLCO 54%, +BD) On home O2.  . Diastolic CHF [facility])    hospitalized last Feb 2018  . Diastolic dysfunction    Grade II  . Fall during current  hospitalization    "was at [facility]; fell; transferred to [facility]" (01/13/2015)  . GERD (gastroesophageal reflux disease)   . Hyperlipidemia   . Hypertension   . Hypoxemic respiratory failure, chronic (HCC)    uses 2.5 liters of oxygen with sleep  . Lower extremity edema    ECHO 07/21/12 = EF 55-60%, performed for TIA. Responding to diurectics. Continue diuresis w/ a goal dry weight of <160lb. Venous Duplex 07/27/11 = Right lower extremity: no evidence of thrombus or thrombophlebitis. Essentially normal right lower extremity venous duplex Doppler evaluation.  . Myocardial infarction    "she's had several" (01/13/2015)  . On home oxygen therapy    "?L; prn" (01/13/2015)  . PAF (paroxysmal atrial fibrillation) (HCC)    In the past, on coumadin  . RBBB (right bundle branch block)    Chronic  . Secondary pulmonary hypertension   . Sleep apnea   . Stroke [facility])    "/CT scan; ~ 2014; didn't even know she'd had it"  (01/13/2015)  . Type II diabetes mellitus (HCC)    goal A1C is 8, to avoid hypoglycemia    Social History   Social History  . Marital status: Widowed    Spouse name: N/A  . Number of children: N/A  . Years of education: N/A   Occupational History  . Not on file.   Social History Main Topics  . Smoking status: Former Smoker    Packs/day: 1.50    Years: 15.00    Types: Cigarettes  . Smokeless tobacco: Never Used     Comment: Smoked 1.5 packs per day for 15  years, quit in 1995.  Marland Kitchen Alcohol use 3.6 oz/week    6 Glasses of wine per week     Comment: 01/13/2015 "couple glasses of wine maybe 3 days/wk"  . Drug use: No  . Sexual activity: No   Other Topics Concern  . Not on file   Social History Narrative   Widowed, lives with daughter     Family History  Problem Relation Age of Onset  . Cancer Mother     Skin  . Heart disease Mother   . Emphysema Father   . Heart disease Father   . Congestive Heart Failure Father   . Heart disease Brother   . Heart disease Brother   . Early death Sister      Review of Systems: General: negative for chills, fever, night sweats or weight changes.  Cardiovascular: negative for chest pain, orthopnea, palpitations, paroxysmal nocturnal dyspnea  Dermatological: negative for rash Respiratory: negative for cough or wheezing Urologic: negative for hematuria Abdominal: negative for nausea, vomiting, diarrhea, bright red blood per rectum, melena, or hematemesis Neurologic: negative for visual changes, syncope, or dizziness C/O low back pain All other systems reviewed and are otherwise negative except as noted above.    Blood pressure 130/76, pulse 66, height 5' (1.524 m), weight 116 lb (52.6 kg).  General appearance: alert, cooperative, appears stated age, no distress and on O2 Lungs: few crackles Lt base Heart: regular rate and rhythm Extremities: trace edema Skin: Skin color, texture, turgor normal. No rashes or lesions Neurologic: Grossly normal   ASSESSMENT AND PLAN:   CKD stage 3 secondary to diabetes (HCC) Last SCr 1.2  COPD with emphysema (HCC) On chronic O2, GOLD 4  PAF (paroxysmal atrial fibrillation) (HCC) PAF, on Amiodarone/Coumadin-Toprol stopped secondary to bradycardia. HR improved. CHADs VASc=7  PVD (peripheral vascular disease), moderate carotid and LSCA disease Moderate LICA  Disease by doppler  Nov 2016  RBBB Chronic  CAD S/P percutaneous coronary  angioplasty remote RCA PCI 2006, subsequently noted to be occluded, last cath 8/11  Acute on chronic combined systolic and diastolic CHF (congestive heart failure) ([place]) Seen in the office today after her recent admission for acute on chronic diastolic CHF   PLAN  Same Rx, check BMP, f/u in [place] with Dr Percival Spanish.   Kerin Ransom PA-C 09/20/2016 11:46 AM

## 2016-09-20 NOTE — Patient Instructions (Signed)
Medication Instructions:  Your physician recommends that you continue on your current medications as directed. Please refer to the Current Medication list given to you today.  Labwork: Your physician recommends that you return for lab work in: TODAY-BMP  Testing/Procedures: NONE   Follow-Up: Your physician recommends that you schedule a follow-up appointment in: [place].  Any Other Special Instructions Will Be Listed Below (If Applicable).  If you need a refill on your cardiac medications before your next appointment, please call your pharmacy.

## 2016-09-22 DIAGNOSIS — G44219 Episodic tension-type headache, not intractable: Secondary | ICD-10-CM | POA: Diagnosis not present

## 2016-09-23 ENCOUNTER — Encounter: Payer: Self-pay | Admitting: *Deleted

## 2016-10-06 ENCOUNTER — Other Ambulatory Visit: Payer: Self-pay | Admitting: *Deleted

## 2016-10-06 ENCOUNTER — Encounter: Payer: Self-pay | Admitting: *Deleted

## 2016-10-06 NOTE — Patient Outreach (Signed)
[place] [facility]) Care Management   10/06/2016  Vickee Mormino 07-Feb-1928 [id number]  Marjo Grosvenor is an 83 y.o. female  Subjective: Routine home visit with pt, HIPAA verified, pt reports [facility] nurse saw her yesterday and also set up scales and tablet for telemonitoring,  Pt states " I'm doing the weights and I told the nurse I couldn't see the tablet clearly so I won't be using the tablet"  Pt states she took one nitroglycerin yesterday because she got upset and states " my chest got a little tight but the medicine helped and no more tightness since then"  Pt continues weighing daily and checking CBG.  Objective:   Vitals:   10/06/16 1127  BP: 138/60  Pulse: 60  Resp: 18  SpO2: 92%  Weight: 120 lb (54.4 kg)  CBG 124 ROS  Physical Exam  Constitutional: She is oriented to person, place, and time. She appears well-developed and well-nourished.  HENT:  Head: Normocephalic.  Neck: Normal range of motion. Neck supple.  Cardiovascular: Normal rate and regular rhythm.   Respiratory: Effort normal and breath sounds normal.  GI: Soft. Bowel sounds are normal.  Musculoskeletal: Normal range of motion. She exhibits edema.  1-2+ edema lower extremities bil  Neurological: She is alert and oriented to person, place, and time.  Skin: Skin is warm and dry.  Psychiatric: She has a normal mood and affect. Her behavior is normal. Judgment and thought content normal.    Encounter Medications:   Outpatient Encounter Prescriptions as of 10/06/2016  Medication Sig  . ACCU-CHEK AVIVA PLUS test strip Test tid. Dx E11.9  . acetaminophen (TYLENOL) 500 MG tablet Take 500 mg by mouth every 4 (four) hours as needed for mild pain.   Marland Kitchen amiodarone (PACERONE) 200 MG tablet Take 0.5 tablets (100 mg total) by mouth daily.  Marland Kitchen aspirin EC 81 MG tablet Take 81 mg by mouth daily with breakfast.   . atorvastatin (LIPITOR) 20 MG tablet TAKE 1 TABLET DAILY IN THE EVENING  . Azelastine HCl  0.15 % SOLN Place 1 spray into both nostrils 2 (two) times daily as needed (congestion).  . baclofen (LIORESAL) 10 MG tablet TAKE 1/2 TABLET EVERY 8 HOURS AS NEEDED FOR MUSCLE SPASM  . budesonide-formoterol (SYMBICORT) 160-4.5 MCG/ACT inhaler Inhale 1 puff into the lungs 2 (two) times daily. (Patient taking differently: Inhale 1 puff into the lungs 2 (two) times daily as needed (wheezing). )  . cholecalciferol (VITAMIN D) 1000 units tablet Take 1,000 Units by mouth daily.  Marland Kitchen docusate sodium (COLACE) 100 MG capsule Take 100 mg by mouth daily as needed for moderate constipation.   . ferrous sulfate 325 (65 FE) MG tablet TAKE 1 TABLET ONCE DAILY WITH BREAKFAST  . fluticasone (FLONASE) 50 MCG/ACT nasal spray Place 1 spray into both nostrils 2 (two) times daily. (Patient taking differently: Place 1 spray into both nostrils as needed. )  . glucosamine-chondroitin 500-400 MG tablet Take 1 tablet by mouth 2 (two) times daily.  . GuaiFENesin (CHEST CONGESTION RELIEF PO) Take 1-1.5 tablets by mouth 2 (two) times daily.  . hydrocortisone (ANUSOL-HC) 25 MG suppository Place 1 suppository (25 mg total) rectally 2 (two) times daily as needed for hemorrhoids or itching.  . Insulin Detemir (LEVEMIR FLEXTOUCH) 100 UNIT/ML Pen INJECT 8 units once a day each morning  . Insulin Pen Needle (PEN NEEDLES) 31G X 6 MM MISC Use to administer insulin up to qid  . Ipratropium-Albuterol (COMBIVENT) 20-100 MCG/ACT AERS respimat Inhale 1  puff into the lungs every 6 (six) hours as needed for wheezing or shortness of breath.  . Multiple Vitamins-Minerals (CENTRUM ADULTS) TABS Take 1 tablet by mouth daily with breakfast.  . Multiple Vitamins-Minerals (PRESERVISION AREDS) CAPS Take 1 capsule by mouth daily with breakfast.  . NIFEdipine (PROCARDIA-XL/ADALAT-CC/NIFEDICAL-XL) 30 MG 24 hr tablet Take 1 tablet (30 mg total) by mouth daily.  . nitroGLYCERIN (NITROSTAT) 0.4 MG SL tablet Place 1 tablet (0.4 mg total) under the tongue every 5  (five) minutes as needed for chest pain.  Marland Kitchen ondansetron (ZOFRAN ODT) 4 MG disintegrating tablet Take 1 tablet (4 mg total) by mouth every 8 (eight) hours as needed for nausea or vomiting.  . OXYGEN Inhale 2.5 L into the lungs daily.  . pantoprazole (PROTONIX) 40 MG tablet TAKE 1 TABLET DAILY  . polyethylene glycol (MIRALAX / GLYCOLAX) packet Take 17 g by mouth 2 (two) times daily as needed for moderate constipation.  . potassium chloride SA (K-DUR,KLOR-CON) 20 MEQ tablet TAKE 1 TABLET DAILY  . simethicone (MYLICON) 80 MG chewable tablet Chew 80 mg by mouth at bedtime as needed for flatulence.  . torsemide (DEMADEX) 20 MG tablet Take 40 mg (2 tabs) two times daily  . traMADol (ULTRAM) 50 MG tablet Take 1 tablet (50 mg total) by mouth every 6 (six) hours as needed for severe pain.  . vitamin C (ASCORBIC ACID) 500 MG tablet Take 500 mg by mouth at bedtime.   Marland Kitchen warfarin (COUMADIN) 4 MG tablet No warfarin 3/9 or 3/10 (pt's family to removed from packet). Then decrease to 1 tab Mon and Fri.  1.5 tablets all other days.  Marland Kitchen PRODIGY TWIST TOP LANCETS 28G MISC CHECK BLOOD SUGAR UP TO 4 TIMES A DAY   No facility-administered encounter medications on file as of 10/06/2016.     Functional Status:   In your present state of health, do you have any difficulty performing the following activities: 08/23/2016 08/08/2016  Hearing? Tempie Donning  Vision? Y Y  Difficulty concentrating or making decisions? N N  Walking or climbing stairs? N N  Dressing or bathing? N N  Doing errands, shopping? Tempie Donning  Preparing Food and eating ? N -  Using the Toilet? N -  In the past six months, have you accidently leaked urine? N -  Do you have problems with loss of bowel control? N -  Managing your Medications? Y -  Managing your Finances? N -  Housekeeping or managing your Housekeeping? Y -  Some recent data might be hidden    Fall/Depression Screening:    PHQ 2/9 Scores 09/08/2016 08/23/2016 08/17/2016 07/20/2016 07/15/2016 05/13/2016  05/03/2016  PHQ - 2 Score 0 _0 0 0  PHQ- 9 Score - - - 6 6 - -    Assessment:  Pt managing well at home, continues to weigh daily and record, checks CBG and records, saw Dr. Collie Siad on 3/27 with no changes made.  Pt has small dried skin area to left great toe, pt states " that is healed blood blister and doctor has seen it"  RN CM reviewed signs/ symptoms of infection and importance of observing feet daily and reporting any changes to MD, pt verbalizes understanding.  RN CM encouraged pt to elevate feet throughout the day and continue wrapping legs as instructed by MD.   RN CM spoke with pt about discharge plan and will see pt next month and plan to discharge.  [facility] CM Care Plan Problem One  Most Recent Value  Care Plan Problem One  Pt high risk for readmission related to CHF  Role Documenting the Problem One  Care Management [place] for Problem One  Active  [facility] Long Term Goal (31-90 days)  Pt will demonstrate adequate self care for CHF within 90 days  [facility] Long Term Goal Start Date  08/15/16  Interventions for Problem One Long Term Goal  RN CM reinforced and reviewed CHF zones   and action plan  [facility] CM Short Term Goal #1 (0-30 days)  Pt will verbalize CHF zones within 30 days  [facility] CM Short Term Goal #1 Start Date  10/06/16 Barrie Folk restarted for reinforcement]  Interventions for Short Term Goal #1  RN CM reviewed importance of daily weights, CHF action plan/ zones, importance of calling MD early for change in health status, symptoms    [facility] CM Care Plan Problem Two     Most Recent Value  Care Plan Problem Two  Knowledge deficit related to COPD  Role Documenting the Problem Two  Care Management Coordinator  Care Plan for Problem Two  Active  [facility] CM Short Term Goal #1 (0-30 days)  Pt will verbalize COPD zones/ action plan within 30 days  [facility] CM Short Term Goal #1 Start Date  09/20/16  [facility] CM Short Term Goal #1 Met Date   10/06/16  Interventions for Short Term Goal #2   RNCM  reinforced COPD action plan/ zones, symptom management      Plan: follow up with home visit in May Plan to discharge in May or transfer to health coach  Jacqlyn Larsen [facility] [place] Coordinator [phone]

## 2016-10-11 ENCOUNTER — Telehealth: Payer: Self-pay | Admitting: Pharmacist

## 2016-10-11 ENCOUNTER — Ambulatory Visit (INDEPENDENT_AMBULATORY_CARE_PROVIDER_SITE_OTHER): Payer: Medicare Other | Admitting: Pharmacist

## 2016-10-11 DIAGNOSIS — M25562 Pain in left knee: Principal | ICD-10-CM

## 2016-10-11 DIAGNOSIS — I259 Chronic ischemic heart disease, unspecified: Secondary | ICD-10-CM | POA: Diagnosis not present

## 2016-10-11 DIAGNOSIS — J449 Chronic obstructive pulmonary disease, unspecified: Secondary | ICD-10-CM | POA: Diagnosis not present

## 2016-10-11 DIAGNOSIS — I480 Paroxysmal atrial fibrillation: Secondary | ICD-10-CM

## 2016-10-11 DIAGNOSIS — M25561 Pain in right knee: Secondary | ICD-10-CM

## 2016-10-11 LAB — COAGUCHEK XS/INR WAIVED
INR: 2.2 — ABNORMAL HIGH (ref 0.9–1.1)
PROTHROMBIN TIME: 26.6 s

## 2016-10-11 NOTE — Telephone Encounter (Signed)
Please request home physical therapy for gait strengthening and stability

## 2016-10-12 ENCOUNTER — Encounter: Payer: Self-pay | Admitting: Physician Assistant

## 2016-10-12 ENCOUNTER — Ambulatory Visit (INDEPENDENT_AMBULATORY_CARE_PROVIDER_SITE_OTHER): Payer: Medicare Other | Admitting: Physician Assistant

## 2016-10-12 VITALS — BP 125/67 | HR 70 | Temp 97.4°F | Ht 60.0 in | Wt 120.0 lb

## 2016-10-12 DIAGNOSIS — L03116 Cellulitis of left lower limb: Secondary | ICD-10-CM

## 2016-10-12 DIAGNOSIS — R609 Edema, unspecified: Secondary | ICD-10-CM | POA: Diagnosis not present

## 2016-10-12 MED ORDER — TORSEMIDE 20 MG PO TABS: ORAL_TABLET | ORAL | 1 refills | Status: AC

## 2016-10-12 MED ORDER — CEPHALEXIN 500 MG PO CAPS: 500.0000 mg | ORAL_CAPSULE | Freq: Three times a day (TID) | ORAL | 0 refills | Status: DC

## 2016-10-12 NOTE — Patient Instructions (Signed)
Cellulitis, Adult Cellulitis is a skin infection. The infected area is usually red and sore. This condition occurs most often in the arms and lower legs. It is very important to get treated for this condition. Follow these instructions at home:  Take over-the-counter and prescription medicines only as told by your doctor.  If you were prescribed an antibiotic medicine, take it as told by your doctor. Do not stop taking the antibiotic even if you start to feel better.  Drink enough fluid to keep your pee (urine) clear or pale yellow.  Do not touch or rub the infected area.  Raise (elevate) the infected area above the level of your heart while you are sitting or lying down.  Place warm or cold wet cloths (warm or cold compresses) on the infected area. Do this as told by your doctor.  Keep all follow-up visits as told by your doctor. This is important. These visits let your doctor make sure your infection is not getting worse. Contact a doctor if:  You have a fever.  Your symptoms do not get better after 1-2 days of treatment.  Your bone or joint under the infected area starts to hurt after the skin has healed.  Your infection comes back. This can happen in the same area or another area.  You have a swollen bump in the infected area.  You have new symptoms.  You feel ill and also have muscle aches and pains. Get help right away if:  Your symptoms get worse.  You feel very sleepy.  You throw up (vomit) or have watery poop (diarrhea) for a long time.  There are red streaks coming from the infected area.  Your red area gets larger.  Your red area turns darker. This information is not intended to replace advice given to you by your health care provider. Make sure you discuss any questions you have with your health care provider. Document Released: 11/30/2007 Document Revised: 11/19/2015 Document Reviewed: 04/22/2015 Elsevier Interactive Patient Education  2017 Elsevier  Inc.  

## 2016-10-12 NOTE — Telephone Encounter (Signed)
Referral placed. Patient aware.  

## 2016-10-13 NOTE — Progress Notes (Signed)
BP 125/67 (BP Location: Left Arm)   Pulse 70   Temp 97.4 F (36.3 C) (Oral)   Ht 5' (1.524 m)   Wt 120 lb (54.4 kg)   BMI 23.44 kg/m    Subjective:    Patient ID: [name], female    DOB: 11-Apr-1928, 83 y.o.   MRN: [id number]  HPI: [name] is a 83 y.o. female presenting on 10/12/2016 for blister on left leg  Known History of edema in her lower legs causing cellulitis. She's never had one causes many blisters as this time did. She is using compression as she is supposed to. She is currently under treatment with Tammy Eckard for her pro time. She states that she has been well controlled lately. All of her medications are reviewed.  Relevant past medical, surgical, family and social history reviewed and updated as indicated. Allergies and medications reviewed and updated.  Past Medical History:  Diagnosis Date  . Anxiety   . Bradycardia    Torpol stopped  . CAD (coronary artery disease)    Cath in 2006 showed an occluded RCA with left-to-right collaterals & otherwise noncritical CAD with EF=25-30% at that time. EF subsequently improved to normal by 2D ECHO. Cath Aug. 2011 showed unchanged anatomy. > medical therapy recommended  . Carotid artery disease (HCC)    Moderate, right greater than left which we following by duplex ultrasound. [place] 12/05/11 = RIght Bulb/Prox ICA: Moderate to severe amt of fibrous plaque elevating velocities w/in prox segment of ICA. Consistent w/a 50-69% diameter reduction. Left Bulb/Prox ICA: Moderate amt of fibrous plaque slightly elevating velocities w/in the prox segment of the ICA. Consistent w/a 0-49% diameter reduction.   . Chronic lower back pain   . COPD (chronic obstructive pulmonary disease) (HCC)    GOLD 4 - PFT 06/08/10 FEV1 0.93 (62%), FEV1% 60, TLC 2.84 (69%0, DLCO 54%, +BD) On home O2.  . Diastolic CHF [facility])    hospitalized last Feb 2018  . Diastolic dysfunction    Grade II  . Fall during current hospitalization    "was at  [facility]; fell; transferred to [facility]" (01/13/2015)  . GERD (gastroesophageal reflux disease)   . Hyperlipidemia   . Hypertension   . Hypoxemic respiratory failure, chronic (HCC)    uses 2.5 liters of oxygen with sleep  . Lower extremity edema    ECHO 07/21/12 = EF 55-60%, performed for TIA. Responding to diurectics. Continue diuresis w/ a goal dry weight of <160lb. Venous Duplex 07/27/11 = Right lower extremity: no evidence of thrombus or thrombophlebitis. Essentially normal right lower extremity venous duplex Doppler evaluation.  . Myocardial infarction [facility])    "she's had several" (01/13/2015)  . On home oxygen therapy    "?L; prn" (01/13/2015)  . PAF (paroxysmal atrial fibrillation) (HCC)    In the past, on coumadin  . RBBB (right bundle branch block)    Chronic  . Secondary pulmonary hypertension   . Sleep apnea   . Stroke [facility])    "/CT scan; ~ 2014; didn't even know she'd had it"  (01/13/2015)  . Type II diabetes mellitus (HCC)    goal A1C is 8, to avoid hypoglycemia    Past Surgical History:  Procedure Laterality Date  . CARDIAC CATHETERIZATION  2006 & 2011   Cath in 2006 showed an occluded RCA with left-to-right collaterals & otherwise noncritical CAD with EF=25-30% at that time. EF subsequently improved to normal by 2D ECHO. Cath Aug. 2011 showed unchanged anatomy. > medical therapy  recommended.  . Carotid Duplex  12/05/11   Moderate, right greater than left which we following by duplex ultrasound. [place] 12/05/11 = RIght Bulb/Prox ICA: Moderate to severe amt of fibrous plaque elevating velocities w/in prox segment of ICA. Consistent w/a 50-69% diameter reduction. Left Bulb/Prox ICA: Moderate amt of fibrous plaque slightly elevating velocities w/in the prox segment of the ICA. Consistent w/a 0-49% diameter reduction.   Marland Kitchen CATARACT EXTRACTION W/ INTRAOCULAR LENS  IMPLANT, BILATERAL Bilateral   . CORONARY ANGIOPLASTY    . CORONARY ANGIOPLASTY WITH STENT PLACEMENT     x2  . FEMUR IM NAIL  Left 01/15/2015   Procedure: INTRAMEDULLARY (IM) NAIL FEMORAL LEFT ;  Surgeon: Netta Cedars, MD;  Location: [place];  Service: Orthopedics;  Laterality: Left;  . SHOULDER OPEN ROTATOR CUFF REPAIR Right 07/2005  . TONSILLECTOMY    . TOTAL ABDOMINAL HYSTERECTOMY  ~ 1967  . TRANSVAGINAL TAPE (TVT) REMOVAL    . Venous Duplex  07/27/11   Venous Duplex 07/27/11 = Right lower extremity: no evidence of thrombus or thrombophlebitis. Essentially normal right lower extremity venous duplex Doppler evaluation.    Review of Systems  Constitutional: Negative.   HENT: Negative.   Eyes: Negative.   Respiratory: Negative.   Gastrointestinal: Negative.   Genitourinary: Negative.   Skin: Positive for color change, rash and wound.    Allergies as of 10/12/2016      Reactions   Atorvastatin Other (See Comments)    muscle aches   Codeine Nausea And Vomiting   Morphine Nausea And Vomiting   Other Other (See Comments)   OPIATES cause nausea and vomiting   Rosuvastatin Other (See Comments)   muscle aches at high doses, held as of 12/2010 due to aches   Sulfa Antibiotics Nausea And Vomiting   Iohexol Other (See Comments)    Desc: unknown reaction; allergic to iodine and contrast      Medication List       Accurate as of 10/12/16 11:59 PM. Always use your most recent med list.          ACCU-CHEK AVIVA PLUS test strip Generic drug:  glucose blood Test tid. Dx E11.9   acetaminophen 500 MG tablet Commonly known as:  TYLENOL Take 500 mg by mouth every 4 (four) hours as needed for mild pain.   amiodarone 200 MG tablet Commonly known as:  PACERONE Take 0.5 tablets (100 mg total) by mouth daily.   aspirin EC 81 MG tablet Take 81 mg by mouth daily with breakfast.   atorvastatin 20 MG tablet Commonly known as:  LIPITOR TAKE 1 TABLET DAILY IN THE EVENING   Azelastine HCl 0.15 % Soln Place 1 spray into both nostrils 2 (two) times daily as needed (congestion).   baclofen 10 MG tablet Commonly known  as:  LIORESAL TAKE 1/2 TABLET EVERY 8 HOURS AS NEEDED FOR MUSCLE SPASM   budesonide-formoterol 160-4.5 MCG/ACT inhaler Commonly known as:  SYMBICORT Inhale 1 puff into the lungs 2 (two) times daily.   cephALEXin 500 MG capsule Commonly known as:  KEFLEX Take 1 capsule (500 mg total) by mouth 3 (three) times daily.   CHEST CONGESTION RELIEF PO Take 1-1.5 tablets by mouth 2 (two) times daily.   cholecalciferol 1000 units tablet Commonly known as:  VITAMIN D Take 1,000 Units by mouth daily.   docusate sodium 100 MG capsule Commonly known as:  COLACE Take 100 mg by mouth daily as needed for moderate constipation.   ferrous sulfate 325 (65 FE)  MG tablet TAKE 1 TABLET ONCE DAILY WITH BREAKFAST   fluticasone 50 MCG/ACT nasal spray Commonly known as:  FLONASE Place 1 spray into both nostrils 2 (two) times daily.   glucosamine-chondroitin 500-400 MG tablet Take 1 tablet by mouth 2 (two) times daily.   hydrocortisone 25 MG suppository Commonly known as:  ANUSOL-HC Place 1 suppository (25 mg total) rectally 2 (two) times daily as needed for hemorrhoids or itching.   Insulin Detemir 100 UNIT/ML Pen Commonly known as:  LEVEMIR FLEXTOUCH INJECT 8 units once a day each morning   Ipratropium-Albuterol 20-100 MCG/ACT Aers respimat Commonly known as:  COMBIVENT Inhale 1 puff into the lungs every 6 (six) hours as needed for wheezing or shortness of breath.   NIFEdipine 30 MG 24 hr tablet Commonly known as:  PROCARDIA-XL/ADALAT-CC/NIFEDICAL-XL Take 1 tablet (30 mg total) by mouth daily.   nitroGLYCERIN 0.4 MG SL tablet Commonly known as:  NITROSTAT Place 1 tablet (0.4 mg total) under the tongue every 5 (five) minutes as needed for chest pain.   ondansetron 4 MG disintegrating tablet Commonly known as:  ZOFRAN ODT Take 1 tablet (4 mg total) by mouth every 8 (eight) hours as needed for nausea or vomiting.   OXYGEN Inhale 2.5 L into the lungs daily.   pantoprazole 40 MG  tablet Commonly known as:  PROTONIX TAKE 1 TABLET DAILY   Pen Needles 31G X 6 MM Misc Use to administer insulin up to qid   polyethylene glycol packet Commonly known as:  MIRALAX / GLYCOLAX Take 17 g by mouth 2 (two) times daily as needed for moderate constipation.   potassium chloride SA 20 MEQ tablet Commonly known as:  K-DUR,KLOR-CON TAKE 1 TABLET DAILY   PRESERVISION AREDS Caps Take 1 capsule by mouth daily with breakfast.   CENTRUM ADULTS Tabs Take 1 tablet by mouth daily with breakfast.   PRODIGY TWIST TOP LANCETS 28G Misc CHECK BLOOD SUGAR UP TO 4 TIMES A DAY   RESTASIS MULTIDOSE 0.05 % ophthalmic emulsion Generic drug:  cycloSPORINE   simethicone 80 MG chewable tablet Commonly known as:  MYLICON Chew 80 mg by mouth at bedtime as needed for flatulence.   torsemide 20 MG tablet Commonly known as:  DEMADEX 2 tab po BID, one week take 3 tab in AM and 2 tab in PM   traMADol 50 MG tablet Commonly known as:  ULTRAM Take 1 tablet (50 mg total) by mouth every 6 (six) hours as needed for severe pain.   vitamin C 500 MG tablet Commonly known as:  ASCORBIC ACID Take 500 mg by mouth at bedtime.   warfarin 4 MG tablet Commonly known as:  COUMADIN No warfarin 3/9 or 3/10 (pt's family to removed from packet). Then decrease to 1 tab Mon and Fri.  1.5 tablets all other days.          Objective:    BP 125/67 (BP Location: Left Arm)   Pulse 70   Temp 97.4 F (36.3 C) (Oral)   Ht 5' (1.524 m)   Wt 120 lb (54.4 kg)   BMI 23.44 kg/m   Allergies  Allergen Reactions  . Atorvastatin Other (See Comments)     muscle aches  . Codeine Nausea And Vomiting  . Morphine Nausea And Vomiting  . Other Other (See Comments)    OPIATES cause nausea and vomiting  . Rosuvastatin Other (See Comments)    muscle aches at high doses, held as of 12/2010 due to aches  . Sulfa Antibiotics  Nausea And Vomiting  . Iohexol Other (See Comments)     Desc: unknown reaction; allergic to iodine  and contrast     Physical Exam  Constitutional: She is oriented to person, place, and time. She appears well-developed and well-nourished.  HENT:  Head: Normocephalic and atraumatic.  Eyes: Conjunctivae and EOM are normal. Pupils are equal, round, and reactive to light.  Cardiovascular: Normal rate, regular rhythm, normal heart sounds and intact distal pulses.   Pulmonary/Chest: Effort normal and breath sounds normal.  Abdominal: Soft. Bowel sounds are normal.  Neurological: She is alert and oriented to person, place, and time. She has normal reflexes.  Skin: Skin is warm and dry. Lesion and rash noted. Rash is vesicular. There is erythema.     Large non ruptured bulla on medial left leg. Right leg with coalesced blisters and erythema  Psychiatric: She has a normal mood and affect. Her behavior is normal. Judgment and thought content normal.    Results for orders placed or performed in visit on 10/11/16  CoaguChek XS/INR Waived  Result Value Ref Range   INR 2.2 (H) 0.9 - 1.1   Prothrombin Time 26.6 sec      Assessment & Plan:   1. Edema, unspecified type - torsemide (DEMADEX) 20 MG tablet; 2 tab po BID, one week take 3 tab in AM and 2 tab in PM  Dispense: 150 tablet; Refill: 1  2. Cellulitis of left lower extremity - cephALEXin (KEFLEX) 500 MG capsule; Take 1 capsule (500 mg total) by mouth 3 (three) times daily.  Dispense: 30 capsule; Refill: 0   Current Outpatient Prescriptions:  .  ACCU-CHEK AVIVA PLUS test strip, Test tid. Dx E11.9, Disp: 200 each, Rfl: 2 .  acetaminophen (TYLENOL) 500 MG tablet, Take 500 mg by mouth every 4 (four) hours as needed for mild pain. , Disp: , Rfl:  .  amiodarone (PACERONE) 200 MG tablet, Take 0.5 tablets (100 mg total) by mouth daily., Disp: 15 tablet, Rfl: 6 .  aspirin EC 81 MG tablet, Take 81 mg by mouth daily with breakfast. , Disp: , Rfl:  .  atorvastatin (LIPITOR) 20 MG tablet, TAKE 1 TABLET DAILY IN THE EVENING, Disp: 90 tablet, Rfl:  0 .  Azelastine HCl 0.15 % SOLN, Place 1 spray into both nostrils 2 (two) times daily as needed (congestion)., Disp: , Rfl:  .  baclofen (LIORESAL) 10 MG tablet, TAKE 1/2 TABLET EVERY 8 HOURS AS NEEDED FOR MUSCLE SPASM, Disp: 45 tablet, Rfl: 0 .  budesonide-formoterol (SYMBICORT) 160-4.5 MCG/ACT inhaler, Inhale 1 puff into the lungs 2 (two) times daily. (Patient taking differently: Inhale 1 puff into the lungs 2 (two) times daily as needed (wheezing). ), Disp: 3 Inhaler, Rfl: 2 .  cholecalciferol (VITAMIN D) 1000 units tablet, Take 1,000 Units by mouth daily., Disp: , Rfl:  .  docusate sodium (COLACE) 100 MG capsule, Take 100 mg by mouth daily as needed for moderate constipation. , Disp: , Rfl:  .  ferrous sulfate 325 (65 FE) MG tablet, TAKE 1 TABLET ONCE DAILY WITH BREAKFAST, Disp: 100 tablet, Rfl: 4 .  fluticasone (FLONASE) 50 MCG/ACT nasal spray, Place 1 spray into both nostrils 2 (two) times daily. (Patient taking differently: Place 1 spray into both nostrils as needed. ), Disp: 16 g, Rfl: 6 .  glucosamine-chondroitin 500-400 MG tablet, Take 1 tablet by mouth 2 (two) times daily., Disp: , Rfl:  .  GuaiFENesin (CHEST CONGESTION RELIEF PO), Take 1-1.5 tablets by mouth 2 (two) times  daily., Disp: , Rfl:  .  hydrocortisone (ANUSOL-HC) 25 MG suppository, Place 1 suppository (25 mg total) rectally 2 (two) times daily as needed for hemorrhoids or itching., Disp: 12 suppository, Rfl: 0 .  Insulin Detemir (LEVEMIR FLEXTOUCH) 100 UNIT/ML Pen, INJECT 8 units once a day each morning, Disp: 15 mL, Rfl: 6 .  Insulin Pen Needle (PEN NEEDLES) 31G X 6 MM MISC, Use to administer insulin up to qid, Disp: 200 each, Rfl: 8 .  Ipratropium-Albuterol (COMBIVENT) 20-100 MCG/ACT AERS respimat, Inhale 1 puff into the lungs every 6 (six) hours as needed for wheezing or shortness of breath., Disp: 3 Inhaler, Rfl: 2 .  Multiple Vitamins-Minerals (CENTRUM ADULTS) TABS, Take 1 tablet by mouth daily with breakfast., Disp: , Rfl:   .  Multiple Vitamins-Minerals (PRESERVISION AREDS) CAPS, Take 1 capsule by mouth daily with breakfast., Disp: , Rfl:  .  NIFEdipine (PROCARDIA-XL/ADALAT-CC/NIFEDICAL-XL) 30 MG 24 hr tablet, Take 1 tablet (30 mg total) by mouth daily., Disp: 30 tablet, Rfl: 6 .  nitroGLYCERIN (NITROSTAT) 0.4 MG SL tablet, Place 1 tablet (0.4 mg total) under the tongue every 5 (five) minutes as needed for chest pain., Disp: 25 tablet, Rfl: 0 .  ondansetron (ZOFRAN ODT) 4 MG disintegrating tablet, Take 1 tablet (4 mg total) by mouth every 8 (eight) hours as needed for nausea or vomiting., Disp: 12 tablet, Rfl: 3 .  OXYGEN, Inhale 2.5 L into the lungs daily., Disp: , Rfl:  .  pantoprazole (PROTONIX) 40 MG tablet, TAKE 1 TABLET DAILY, Disp: 60 tablet, Rfl: 1 .  polyethylene glycol (MIRALAX / GLYCOLAX) packet, Take 17 g by mouth 2 (two) times daily as needed for moderate constipation., Disp: 14 each, Rfl: 0 .  potassium chloride SA (K-DUR,KLOR-CON) 20 MEQ tablet, TAKE 1 TABLET DAILY, Disp: 30 tablet, Rfl: 2 .  PRODIGY TWIST TOP LANCETS 28G MISC, CHECK BLOOD SUGAR UP TO 4 TIMES A DAY, Disp: 200 each, Rfl: 2 .  RESTASIS MULTIDOSE 0.05 % ophthalmic emulsion, , Disp: , Rfl:  .  simethicone (MYLICON) 80 MG chewable tablet, Chew 80 mg by mouth at bedtime as needed for flatulence., Disp: , Rfl:  .  torsemide (DEMADEX) 20 MG tablet, 2 tab po BID, one week take 3 tab in AM and 2 tab in PM, Disp: 150 tablet, Rfl: 1 .  traMADol (ULTRAM) 50 MG tablet, Take 1 tablet (50 mg total) by mouth every 6 (six) hours as needed for severe pain., Disp: 15 tablet, Rfl: 0 .  vitamin C (ASCORBIC ACID) 500 MG tablet, Take 500 mg by mouth at bedtime. , Disp: , Rfl:  .  warfarin (COUMADIN) 4 MG tablet, No warfarin 3/9 or 3/10 (pt's family to removed from packet). Then decrease to 1 tab Mon and Fri.  1.5 tablets all other days., Disp: 45 tablet, Rfl: 0 .  cephALEXin (KEFLEX) 500 MG capsule, Take 1 capsule (500 mg total) by mouth 3 (three) times daily.,  Disp: 30 capsule, Rfl: 0  Continue all other maintenance medications as listed above.  Follow up plan: Return in about 2 weeks (around 10/26/2016) for PCP follow up.  Educational handout given for celluitis  Terald Sleeper PA-C [place] [address]  [place], [place] [address] [phone]   10/13/2016, 1:18 PM

## 2016-10-18 ENCOUNTER — Ambulatory Visit (INDEPENDENT_AMBULATORY_CARE_PROVIDER_SITE_OTHER): Payer: Medicare Other | Admitting: Family Medicine

## 2016-10-18 ENCOUNTER — Ambulatory Visit: Payer: No Typology Code available for payment source | Attending: Family Medicine | Admitting: Physical Therapy

## 2016-10-18 VITALS — BP 162/79 | HR 71 | Temp 97.6°F | Ht 60.0 in | Wt 118.2 lb

## 2016-10-18 DIAGNOSIS — L03116 Cellulitis of left lower limb: Secondary | ICD-10-CM | POA: Diagnosis not present

## 2016-10-18 DIAGNOSIS — R238 Other skin changes: Secondary | ICD-10-CM | POA: Diagnosis not present

## 2016-10-18 MED ORDER — AMOXICILLIN 875 MG PO TABS: 875.0000 mg | ORAL_TABLET | Freq: Two times a day (BID) | ORAL | 0 refills | Status: AC

## 2016-10-18 MED ORDER — MUPIROCIN 2 % EX OINT: TOPICAL_OINTMENT | CUTANEOUS | 1 refills | Status: AC

## 2016-10-18 NOTE — Progress Notes (Signed)
Subjective:  Patient ID: [name], female    DOB: [birth year]  Age: 83 y.o. MRN: [id number]  CC: Blister (pt here today with a fluid filled blister like lesion on her left lower leg, was put on Keflex last week when she saw Melinda Nearing PA and still has some left to take.)   HPI [name] presents for Recheck of the lesion on the left lower leg. She is tolerating the Keflex well but she is concerned that it's not healing quickly in fact thinks it's not getting any better. She's had no fever chills or sweats. She had a child get blood poisoning many years ago and is worried that this could regress to that  History [name] has a past medical history of Anxiety; Bradycardia; CAD (coronary artery disease); Carotid artery disease ([place]); Chronic lower back pain; COPD (chronic obstructive pulmonary disease) ([place]); Diastolic CHF ([place]); Diastolic dysfunction; Fall during current hospitalization; GERD (gastroesophageal reflux disease); Hyperlipidemia; Hypertension; Hypoxemic respiratory failure, chronic ([place]); Lower extremity edema; Myocardial infarction ([place]); On home oxygen therapy; PAF (paroxysmal atrial fibrillation) ([place]); RBBB (right bundle branch block); Secondary pulmonary hypertension; Sleep apnea; Stroke ([place]); and Type II diabetes mellitus ([place]).   She has a past surgical history that includes Shoulder open rotator cuff repair (Right, 07/2005); Cardiac catheterization (2006 & 2011); Coronary angioplasty; Coronary angioplasty with stent; Carotid Duplex (12/05/11); Venous Duplex (07/27/11); Transvaginal tape (tvt) removal; Cataract extraction w/ intraocular lens  implant, bilateral (Bilateral); Tonsillectomy; Total abdominal hysterectomy (~ 1967); and Femur IM nail (Left, 01/15/2015).   Her family history includes Cancer in her mother; Congestive Heart Failure in her father; Early death in her sister; Emphysema in her father; Heart disease in her brother, brother, father, and mother.She reports  that she has quit smoking. Her smoking use included Cigarettes. She has a 22.50 pack-year smoking history. She has never used smokeless tobacco. She reports that she drinks about 3.6 oz of alcohol per week . She reports that she does not use drugs.    ROS Review of Systems  Constitutional: Negative for chills, diaphoresis and fever.  Skin: Positive for rash and wound.    Objective:  BP (!) 162/79   Pulse 71   Temp 97.6 F (36.4 C) (Oral)   Ht 5' (1.524 m)   Wt 118 lb 4 oz (53.6 kg)   BMI 23.09 kg/m   Physical Exam  Constitutional: She is oriented to person, place, and time. No distress.  HENT:  Head: Normocephalic.  Musculoskeletal: Normal range of motion.  Neurological: She is alert and oriented to person, place, and time.  Skin: Skin is warm and dry. No rash noted. There is erythema (minima - represents healing tissue. Possibly residual cellulitis).  Lesion depicted below is at lower shin LLE     Deep bulla 2.5 cm with minimal surrounding erythema Assessment & Plan:   [name] was seen today for blister.  Diagnoses and all orders for this visit:  Cellulitis of left lower extremity -     amoxicillin (AMOXIL) 875 MG tablet; Take 1 tablet (875 mg total) by mouth 2 (two) times daily.  Skin bulla -     mupirocin ointment (BACTROBAN) 2 %; Apply and cover with bandage twice daily   I have discontinued [name]'s cephALEXin. I am also having her start on mupirocin ointment and amoxicillin. Additionally, I am having her maintain her vitamin C, glucosamine-chondroitin, simethicone, fluticasone, Azelastine HCl, budesonide-formoterol, Ipratropium-Albuterol, docusate sodium, Pen Needles, hydrocortisone, ACCU-CHEK AVIVA PLUS, traMADol, acetaminophen, ferrous sulfate, cholecalciferol,  OXYGEN, aspirin EC, ondansetron, PRESERVISION AREDS, CENTRUM ADULTS, polyethylene glycol, PRODIGY TWIST TOP LANCETS 28G, baclofen, potassium chloride SA, GuaiFENesin (CHEST CONGESTION RELIEF PO),  Insulin Detemir, nitroGLYCERIN, pantoprazole, warfarin, atorvastatin, amiodarone, NIFEdipine, RESTASIS MULTIDOSE, and torsemide.  Meds ordered this encounter  Medications  . mupirocin ointment (BACTROBAN) 2 %    Sig: Apply and cover with bandage twice daily    Dispense:  30 g    Refill:  1  . amoxicillin (AMOXIL) 875 MG tablet    Sig: Take 1 tablet (875 mg total) by mouth 2 (two) times daily.    Dispense:  20 tablet    Refill:  0     Follow-up: Return in about 2 weeks (around 11/01/2016).  Claretta Fraise, M.D.

## 2016-10-25 ENCOUNTER — Other Ambulatory Visit: Payer: Self-pay | Admitting: Pharmacist

## 2016-10-25 ENCOUNTER — Encounter: Payer: Self-pay | Admitting: Family

## 2016-10-25 ENCOUNTER — Ambulatory Visit (INDEPENDENT_AMBULATORY_CARE_PROVIDER_SITE_OTHER): Payer: Medicare Other | Admitting: Family

## 2016-10-25 VITALS — BP 153/76 | HR 67 | Temp 96.9°F | Ht 60.0 in | Wt 117.2 lb

## 2016-10-25 DIAGNOSIS — L03119 Cellulitis of unspecified part of limb: Secondary | ICD-10-CM

## 2016-10-25 MED ORDER — DOXYCYCLINE HYCLATE 100 MG PO TABS: 100.0000 mg | ORAL_TABLET | Freq: Two times a day (BID) | ORAL | 0 refills | Status: DC

## 2016-10-25 MED ORDER — CEFTRIAXONE SODIUM 1 G IJ SOLR
500.0000 mg | Freq: Once | INTRAMUSCULAR | Status: AC
  Administered 2016-10-25: 500 mg via INTRAMUSCULAR

## 2016-10-25 NOTE — Progress Notes (Signed)
   Subjective:    Patient ID: [name], female    DOB: Oct 05, 1927, 83 y.o.   MRN: [id number]  HPI Pt presents to the office today to recheck cellulitis on bilateral legs. Pt reports the swelling in her legs having improved, but continues to have edema. PT reports erythemas, tenderness, and opened lesion on medial left lower leg that is draining clear fluid. Pt reports intermittent pain 7 out 10 that is worse when she walks. Pt has taken amoxicillin 875 mg and is taking demadex 20 mg.    Review of Systems  Cardiovascular: Positive for leg swelling.  Skin: Positive for wound.  All other systems reviewed and are negative.      Objective:   Physical Exam  Constitutional: She is oriented to person, place, and time. She appears well-developed and well-nourished. No distress.  HENT:  Head: Normocephalic.  Cardiovascular: Normal rate, regular rhythm, normal heart sounds and intact distal pulses.   No murmur heard. Pulmonary/Chest: Effort normal and breath sounds normal. No respiratory distress. She has no wheezes.  Abdominal: Soft. Bowel sounds are normal. She exhibits no distension. There is no tenderness.  Musculoskeletal: Normal range of motion. She exhibits edema (2+ in bileral ankles). She exhibits no tenderness.  Neurological: She is alert and oriented to person, place, and time.  Skin: Skin is warm and dry. There is erythema.  Left medial ankle 2.8X2.3  Psychiatric: She has a normal mood and affect. Her behavior is normal. Judgment and thought content normal.  Vitals reviewed.     BP (!) 153/76   Pulse 67   Temp (!) 96.9 F (36.1 C) (Oral)   Ht 5' (1.524 m)   Wt 117 lb 3.2 oz (53.2 kg)   BMI 22.89 kg/m      Assessment & Plan:  1. Cellulitis of lower extremity, unspecified laterality PT started on Doxycycline today Continue demadex Keep elevated Area marked RTO in 10 days - cefTRIAXone (ROCEPHIN) injection 500 mg; Inject 0.5 g (500 mg total) into the muscle  once. - doxycycline (VIBRA-TABS) 100 MG tablet; Take 1 tablet (100 mg total) by mouth 2 (two) times daily.  Dispense: 20 tablet; Refill: 0   Evelina Dun, FNP

## 2016-10-25 NOTE — Patient Instructions (Addendum)
Cellulitis, Adult Cellulitis is a skin infection. The infected area is usually red and sore. This condition occurs most often in the arms and lower legs. It is very important to get treated for this condition. Follow these instructions at home:  Take over-the-counter and prescription medicines only as told by your doctor.  If you were prescribed an antibiotic medicine, take it as told by your doctor. Do not stop taking the antibiotic even if you start to feel better.  Drink enough fluid to keep your pee (urine) clear or pale yellow.  Do not touch or rub the infected area.  Raise (elevate) the infected area above the level of your heart while you are sitting or lying down.  Place warm or cold wet cloths (warm or cold compresses) on the infected area. Do this as told by your doctor.  Keep all follow-up visits as told by your doctor. This is important. These visits let your doctor make sure your infection is not getting worse. Contact a doctor if:  You have a fever.  Your symptoms do not get better after 1-2 days of treatment.  Your bone or joint under the infected area starts to hurt after the skin has healed.  Your infection comes back. This can happen in the same area or another area.  You have a swollen bump in the infected area.  You have new symptoms.  You feel ill and also have muscle aches and pains. Get help right away if:  Your symptoms get worse.  You feel very sleepy.  You throw up (vomit) or have watery poop (diarrhea) for a long time.  There are red streaks coming from the infected area.  Your red area gets larger.  Your red area turns darker. This information is not intended to replace advice given to you by your health care provider. Make sure you discuss any questions you have with your health care provider. Document Released: 11/30/2007 Document Revised: 11/19/2015 Document Reviewed: 04/22/2015 Elsevier Interactive Patient Education  2017 Elsevier  Inc.  

## 2016-10-26 ENCOUNTER — Other Ambulatory Visit: Payer: Self-pay | Admitting: Family Medicine

## 2016-11-02 ENCOUNTER — Encounter: Payer: Self-pay | Admitting: *Deleted

## 2016-11-02 ENCOUNTER — Other Ambulatory Visit: Payer: Self-pay | Admitting: *Deleted

## 2016-11-02 NOTE — Patient Outreach (Addendum)
[facility]) Care Management   11/02/2016  Nashalie Sallis 07/08/1927 [id number]  [name] is an 83 y.o. female  Subjective: Routine home visit with pt, HIPAA verified, pt reports she is weighing daily and using [employer] which pt just replaced batteries during this visit.  Pt states she had " a bout with cellulitis and a sore on my leg which is now healed and looks so much better"  Pt states she is not going to wear compression hose.  Pt states she is elevating legs "when I can"  Pt reports "blood sugars have been good"    Objective:   Vitals:   11/02/16 1207  BP: 108/62  Pulse: 64  Resp: 18  SpO2: 95%  Weight: 118 lb 12.8 oz (53.9 kg)  CBG today 128 ROS  Physical Exam  Constitutional: She is oriented to person, place, and time.  HENT:  Head: Normocephalic.  Neck: Normal range of motion. Neck supple.  Cardiovascular: Normal rate.   Respiratory: Effort normal and breath sounds normal.  GI: Soft. Bowel sounds are normal.  Musculoskeletal: Normal range of motion. She exhibits edema.  1+ edema lower extremites bil, tightness noted  Neurological: She is alert and oriented to person, place, and time.  Skin: Skin is warm and dry.  Healed area to left lower anterior leg, slight redness noted  Psychiatric: She has a normal mood and affect. Her behavior is normal. Judgment and thought content normal.    Encounter Medications:   Outpatient Encounter Prescriptions as of 11/02/2016  Medication Sig  . ACCU-CHEK AVIVA PLUS test strip Test tid. Dx E11.9  . acetaminophen (TYLENOL) 500 MG tablet Take 500 mg by mouth every 4 (four) hours as needed for mild pain.   Marland Kitchen amiodarone (PACERONE) 200 MG tablet Take 0.5 tablets (100 mg total) by mouth daily.  Marland Kitchen aspirin EC 81 MG tablet Take 81 mg by mouth daily with breakfast.   . atorvastatin (LIPITOR) 20 MG tablet TAKE 1 TABLET DAILY IN THE EVENING  . Azelastine HCl 0.15 % SOLN Place 1 spray into both nostrils 2  (two) times daily as needed (congestion).  . baclofen (LIORESAL) 10 MG tablet TAKE 1/2 TABLET EVERY 8 HOURS AS NEEDED FOR MUSCLE SPASM  . budesonide-formoterol (SYMBICORT) 160-4.5 MCG/ACT inhaler Inhale 1 puff into the lungs 2 (two) times daily. (Patient taking differently: Inhale 1 puff into the lungs 2 (two) times daily as needed (wheezing). )  . cholecalciferol (VITAMIN D) 1000 units tablet Take 1,000 Units by mouth daily.  Marland Kitchen docusate sodium (COLACE) 100 MG capsule Take 100 mg by mouth daily as needed for moderate constipation.   Marland Kitchen doxycycline (VIBRA-TABS) 100 MG tablet Take 1 tablet (100 mg total) by mouth 2 (two) times daily.  . ferrous sulfate 325 (65 FE) MG tablet TAKE 1 TABLET ONCE DAILY WITH BREAKFAST  . fluticasone (FLONASE) 50 MCG/ACT nasal spray Place 1 spray into both nostrils 2 (two) times daily. (Patient taking differently: Place 1 spray into both nostrils as needed. )  . glucosamine-chondroitin 500-400 MG tablet Take 1 tablet by mouth 2 (two) times daily.  . GuaiFENesin (CHEST CONGESTION RELIEF PO) Take 1-1.5 tablets by mouth 2 (two) times daily.  . hydrocortisone (ANUSOL-HC) 25 MG suppository Place 1 suppository (25 mg total) rectally 2 (two) times daily as needed for hemorrhoids or itching.  . Insulin Detemir (LEVEMIR FLEXTOUCH) 100 UNIT/ML Pen INJECT 8 units once a day each morning  . Insulin Pen Needle (PEN NEEDLES) 31G X 6  MM MISC Use to administer insulin up to qid  . Ipratropium-Albuterol (COMBIVENT) 20-100 MCG/ACT AERS respimat Inhale 1 puff into the lungs every 6 (six) hours as needed for wheezing or shortness of breath.  . Multiple Vitamins-Minerals (CENTRUM ADULTS) TABS Take 1 tablet by mouth daily with breakfast.  . Multiple Vitamins-Minerals (PRESERVISION AREDS) CAPS Take 1 capsule by mouth daily with breakfast.  . mupirocin ointment (BACTROBAN) 2 % Apply and cover with bandage twice daily  . NIFEdipine (PROCARDIA-XL/ADALAT-CC/NIFEDICAL-XL) 30 MG 24 hr tablet Take 1  tablet (30 mg total) by mouth daily.  . nitroGLYCERIN (NITROSTAT) 0.4 MG SL tablet Place 1 tablet (0.4 mg total) under the tongue every 5 (five) minutes as needed for chest pain.  Marland Kitchen ondansetron (ZOFRAN ODT) 4 MG disintegrating tablet Take 1 tablet (4 mg total) by mouth every 8 (eight) hours as needed for nausea or vomiting.  . OXYGEN Inhale 2.5 L into the lungs daily.  . pantoprazole (PROTONIX) 40 MG tablet TAKE 1 TABLET DAILY  . polyethylene glycol (MIRALAX / GLYCOLAX) packet Take 17 g by mouth 2 (two) times daily as needed for moderate constipation.  . potassium chloride SA (K-DUR,KLOR-CON) 20 MEQ tablet TAKE 1 TABLET DAILY  . PRODIGY TWIST TOP LANCETS 28G MISC CHECK BLOOD SUGAR UP TO 4 TIMES A DAY  . RESTASIS MULTIDOSE 0.05 % ophthalmic emulsion   . simethicone (MYLICON) 80 MG chewable tablet Chew 80 mg by mouth at bedtime as needed for flatulence.  . torsemide (DEMADEX) 20 MG tablet 2 tab po BID, one week take 3 tab in AM and 2 tab in PM  . traMADol (ULTRAM) 50 MG tablet Take 1 tablet (50 mg total) by mouth every 6 (six) hours as needed for severe pain.  . vitamin C (ASCORBIC ACID) 500 MG tablet Take 500 mg by mouth at bedtime.   Marland Kitchen warfarin (COUMADIN) 4 MG tablet Take 1 tab Mon and Fri. 1.5 tablets all other days.   No facility-administered encounter medications on file as of 11/02/2016.     Functional Status:   In your present state of health, do you have any difficulty performing the following activities: 08/23/2016 08/08/2016  Hearing? Tempie Donning  Vision? Y Y  Difficulty concentrating or making decisions? N N  Walking or climbing stairs? N N  Dressing or bathing? N N  Doing errands, shopping? Tempie Donning  Preparing Food and eating ? N -  Using the Toilet? N -  In the past six months, have you accidently leaked urine? N -  Do you have problems with loss of bowel control? N -  Managing your Medications? Y -  Managing your Finances? N -  Housekeeping or managing your Housekeeping? Y -  Some recent  data might be hidden    Fall/Depression Screening:    Fall Risk  10/25/2016 10/18/2016 10/12/2016  Falls in the past year? Yes Yes Yes  Number falls in past yr: _0 Injury with Fall? No No No  Risk Factor Category  - - -  Risk for fall due to : - History of fall(s) -  Follow up - Falls prevention discussed;Education provided -   [facility] 2/9 Scores 10/25/2016 10/18/2016 10/12/2016 09/08/2016 08/23/2016 08/17/2016 07/20/2016  PHQ - 2 Score 0 0 0 0 _1 PHQ- 9 Score - - - - - - 6    Assessment:  Pt has assistance in the home daily and is managing well with CHF, has all medications and taking as prescribed,  batteries placed in scales at this visit.  Continues telemonitoring through [employer].  Pt feels RN health coach would not be appropriate for her as pt has difficulty understanding over the telephone.  Pt is on antiobiotic for cellulitis which is much improved per pt.  Pt refuses to wear compression hose and wraps legs with ace wrap on occasion.  RN CM faxed case closure letter to primary MD Dr. Morrie [name], mailed case closure letter to patient's home.  THN CM Care Plan Problem One     Most Recent Value  Care Plan Problem One  Pt high risk for readmission related to CHF  Role Documenting the Problem One  Care Management Coordinator  Care Plan for Problem One  Active  THN Long Term Goal (31-90 days)  Pt will demonstrate adequate self care for CHF within 90 days  THN Long Term Goal Start Date  08/15/16  [facility] Long Term Goal Met Date  11/02/16  Interventions for Problem One Long Term Goal  RN CM reviewed CHF zones and action plan  THN CM Short Term Goal #1 (0-30 days)  Pt will verbalize CHF zones within 30 days  THN CM Short Term Goal #1 Start Date  10/06/16 Barrie Folk restarted for reinforcement]  THN CM Short Term Goal #1 Met Date  11/02/16  Interventions for Short Term Goal #1  RN CM reinforced with pt importance of daily weights, CHF action plan/ zones, importance of calling MD early for change  in health status, symptoms    THN CM Care Plan Problem Two     Most Recent Value  Care Plan Problem Two  Knowledge deficit related to COPD  Role Documenting the Problem Two  Care Management Coordinator  Care Plan for Problem Two  Active  THN CM Short Term Goal #1 (0-30 days)  Pt will verbalize COPD zones/ action plan within 30 days  THN CM Short Term Goal #1 Start Date  09/20/16  [facility] CM Short Term Goal #1 Met Date   10/06/16     [facility] CM Care Plan Problem One     Most Recent Value  Care Plan Problem One  Pt high risk for readmission related to CHF  Role Documenting the Problem One  Care Management Coordinator  Care Plan for Problem One  Active  THN Long Term Goal (31-90 days)  Pt will demonstrate adequate self care for CHF within 90 days  THN Long Term Goal Start Date  08/15/16  [facility] Long Term Goal Met Date  11/02/16  Interventions for Problem One Long Term Goal  RN CM reviewed CHF zones and action plan  THN CM Short Term Goal #1 (0-30 days)  Pt will verbalize CHF zones within 30 days  THN CM Short Term Goal #1 Start Date  10/06/16 Barrie Folk restarted for reinforcement]  THN CM Short Term Goal #1 Met Date  11/02/16  Interventions for Short Term Goal #1  RN CM reinforced with pt importance of daily weights, CHF action plan/ zones, importance of calling MD early for change in health status, symptoms    THN CM Care Plan Problem Two     Most Recent Value  Care Plan Problem Two  Knowledge deficit related to COPD  Role Documenting the Problem Two  Care Management Coordinator  Care Plan for Problem Two  Active  THN CM Short Term Goal #1 (0-30 days)  Pt will verbalize COPD zones/ action plan within 30 days  THN CM Short Term Goal #1 Start Date  09/20/16  [facility]N CM Short Term Goal #1 Met Date   10/06/16      Plan: discharge today  Jacqlyn Larsen [facility], BSN [place] Coordinator [phone]

## 2016-11-08 ENCOUNTER — Other Ambulatory Visit: Payer: Self-pay | Admitting: Family Medicine

## 2016-11-09 ENCOUNTER — Ambulatory Visit (INDEPENDENT_AMBULATORY_CARE_PROVIDER_SITE_OTHER): Payer: Medicare Other | Admitting: Pharmacist

## 2016-11-09 DIAGNOSIS — I480 Paroxysmal atrial fibrillation: Secondary | ICD-10-CM | POA: Diagnosis not present

## 2016-11-09 LAB — COAGUCHEK XS/INR WAIVED
INR: 2.2 — ABNORMAL HIGH (ref 0.9–1.1)
Prothrombin Time: 26.6 s

## 2016-11-10 ENCOUNTER — Encounter: Payer: Self-pay | Admitting: Family Medicine

## 2016-11-10 ENCOUNTER — Ambulatory Visit (INDEPENDENT_AMBULATORY_CARE_PROVIDER_SITE_OTHER): Payer: Medicare Other | Admitting: Family Medicine

## 2016-11-10 VITALS — BP 130/62 | HR 62 | Temp 97.4°F | Ht 61.0 in | Wt 118.0 lb

## 2016-11-10 DIAGNOSIS — I890 Lymphedema, not elsewhere classified: Secondary | ICD-10-CM

## 2016-11-10 DIAGNOSIS — I259 Chronic ischemic heart disease, unspecified: Secondary | ICD-10-CM | POA: Diagnosis not present

## 2016-11-10 DIAGNOSIS — J449 Chronic obstructive pulmonary disease, unspecified: Secondary | ICD-10-CM | POA: Diagnosis not present

## 2016-11-10 NOTE — Progress Notes (Signed)
   BP 130/62   Pulse 62   Temp 97.4 F (36.3 C) (Oral)   Ht 5\' 1"  (1.549 m)   Wt 118 lb (53.5 kg)   BMI 22.30 kg/m    Subjective:    Patient ID: [name], female    DOB: 11-Sep-1927, 83 y.o.   MRN: [id number]  HPI: [name] is a 83 y.o. female presenting on 11/10/2016 for Cellulitis lower extremities (follow up)   HPI Swelling of lower Legs Patient has had issues with swelling of her lower legs for some time now. She has tried compression stockings for quite a while and they just do not cut it because her legs are so thin before the compression stockings that they do not give much compression for her. She has a lot of pain and tenderness because of the swelling in both of her calves and feet. She denies any redness or warmth or fevers or chills. She denies any drainage from those legs.  Relevant past medical, surgical, family and social history reviewed and updated as indicated. Interim medical history since our last visit reviewed. Allergies and medications reviewed and updated.  Review of Systems  Constitutional: Negative for chills and fever.  Respiratory: Negative for chest tightness and shortness of breath.   Cardiovascular: Positive for leg swelling. Negative for chest pain.  Skin: Negative for rash.  Neurological: Negative for light-headedness and headaches.  Psychiatric/Behavioral: Negative for agitation and behavioral problems.  All other systems reviewed and are negative.   Per HPI unless specifically indicated above        Objective:    BP 130/62   Pulse 62   Temp 97.4 F (36.3 C) (Oral)   Ht 5\' 1"  (1.549 m)   Wt 118 lb (53.5 kg)   BMI 22.30 kg/m   Wt Readings from Last 3 Encounters:  11/10/16 118 lb (53.5 kg)  11/02/16 118 lb 12.8 oz (53.9 kg)  10/25/16 117 lb 3.2 oz (53.2 kg)    Physical Exam  Constitutional: She is oriented to person, place, and time. She appears well-developed and well-nourished. No distress.  Eyes: Conjunctivae are  normal.  Cardiovascular: Normal rate, regular rhythm, normal heart sounds and intact distal pulses.   No murmur heard. Pulmonary/Chest: Effort normal and breath sounds normal. No respiratory distress. She has no wheezes.  Musculoskeletal: Normal range of motion. She exhibits edema (Bilateral lower extremity edema, 2+, no erythema or warmth or drainage.).  Neurological: She is alert and oriented to person, place, and time. Coordination normal.  Skin: Skin is warm and dry. No rash noted. She is not diaphoretic.  Psychiatric: She has a normal mood and affect. Her behavior is normal.  Nursing note and vitals reviewed.       Assessment & Plan:   Problem List Items Addressed This Visit    None    Visit Diagnoses    Chronic acquired lymphedema    -  Primary   Relevant Orders   DME Other see comment      We will try different compression stocking but may consider physical therapy for chronic lymphedema care in the future.  Follow up plan: Return in about 4 weeks (around 12/08/2016), or if symptoms worsen or fail to improve, for f/u  dm.  Counseling provided for all of the vaccine components Orders Placed This Encounter  Procedures  . DME Other see comment    Caryl Pina, MD [facility] 11/10/2016, 4:21 PM

## 2016-11-18 ENCOUNTER — Telehealth: Payer: Self-pay | Admitting: Pulmonary Disease

## 2016-11-18 ENCOUNTER — Other Ambulatory Visit: Payer: Self-pay | Admitting: Family Medicine

## 2016-11-22 NOTE — Telephone Encounter (Signed)
Entered in error.  Noting noted in the phone note.  Will close message.

## 2016-11-28 ENCOUNTER — Encounter (HOSPITAL_COMMUNITY): Payer: Self-pay | Admitting: Vascular Surgery

## 2016-11-28 ENCOUNTER — Emergency Department (HOSPITAL_COMMUNITY): Payer: Medicare Other

## 2016-11-28 ENCOUNTER — Inpatient Hospital Stay (HOSPITAL_COMMUNITY)
Admission: EM | Admit: 2016-11-28 | Discharge: 2016-12-01 | DRG: 291 | Disposition: A | Discharge status: Home / Self Care | Payer: Medicare Other | Attending: Family Medicine | Admitting: Family Medicine

## 2016-11-28 DIAGNOSIS — I5031 Acute diastolic (congestive) heart failure: Secondary | ICD-10-CM | POA: Diagnosis present

## 2016-11-28 DIAGNOSIS — E1151 Type 2 diabetes mellitus with diabetic peripheral angiopathy without gangrene: Secondary | ICD-10-CM | POA: Diagnosis not present

## 2016-11-28 DIAGNOSIS — E1122 Type 2 diabetes mellitus with diabetic chronic kidney disease: Secondary | ICD-10-CM | POA: Diagnosis present

## 2016-11-28 DIAGNOSIS — J9611 Chronic respiratory failure with hypoxia: Secondary | ICD-10-CM | POA: Diagnosis not present

## 2016-11-28 DIAGNOSIS — E876 Hypokalemia: Secondary | ICD-10-CM | POA: Diagnosis not present

## 2016-11-28 DIAGNOSIS — N183 Chronic kidney disease, stage 3 unspecified: Secondary | ICD-10-CM | POA: Diagnosis present

## 2016-11-28 DIAGNOSIS — F05 Delirium due to known physiological condition: Secondary | ICD-10-CM | POA: Diagnosis present

## 2016-11-28 DIAGNOSIS — Z7982 Long term (current) use of aspirin: Secondary | ICD-10-CM

## 2016-11-28 DIAGNOSIS — I70209 Unspecified atherosclerosis of native arteries of extremities, unspecified extremity: Secondary | ICD-10-CM | POA: Diagnosis present

## 2016-11-28 DIAGNOSIS — I5033 Acute on chronic diastolic (congestive) heart failure: Secondary | ICD-10-CM | POA: Diagnosis not present

## 2016-11-28 DIAGNOSIS — J439 Emphysema, unspecified: Secondary | ICD-10-CM | POA: Diagnosis present

## 2016-11-28 DIAGNOSIS — I130 Hypertensive heart and chronic kidney disease with heart failure and stage 1 through stage 4 chronic kidney disease, or unspecified chronic kidney disease: Principal | ICD-10-CM | POA: Diagnosis present

## 2016-11-28 DIAGNOSIS — R079 Chest pain, unspecified: Secondary | ICD-10-CM | POA: Diagnosis not present

## 2016-11-28 DIAGNOSIS — I739 Peripheral vascular disease, unspecified: Secondary | ICD-10-CM

## 2016-11-28 DIAGNOSIS — R296 Repeated falls: Secondary | ICD-10-CM

## 2016-11-28 DIAGNOSIS — R7989 Other specified abnormal findings of blood chemistry: Secondary | ICD-10-CM

## 2016-11-28 DIAGNOSIS — E785 Hyperlipidemia, unspecified: Secondary | ICD-10-CM | POA: Diagnosis not present

## 2016-11-28 DIAGNOSIS — I10 Essential (primary) hypertension: Secondary | ICD-10-CM

## 2016-11-28 DIAGNOSIS — J438 Other emphysema: Secondary | ICD-10-CM | POA: Diagnosis not present

## 2016-11-28 DIAGNOSIS — I509 Heart failure, unspecified: Secondary | ICD-10-CM | POA: Diagnosis not present

## 2016-11-28 DIAGNOSIS — I5041 Acute combined systolic (congestive) and diastolic (congestive) heart failure: Secondary | ICD-10-CM | POA: Diagnosis present

## 2016-11-28 DIAGNOSIS — I2510 Atherosclerotic heart disease of native coronary artery without angina pectoris: Secondary | ICD-10-CM | POA: Diagnosis not present

## 2016-11-28 DIAGNOSIS — Z9981 Dependence on supplemental oxygen: Secondary | ICD-10-CM | POA: Diagnosis not present

## 2016-11-28 DIAGNOSIS — Z794 Long term (current) use of insulin: Secondary | ICD-10-CM | POA: Diagnosis not present

## 2016-11-28 DIAGNOSIS — I480 Paroxysmal atrial fibrillation: Secondary | ICD-10-CM | POA: Diagnosis not present

## 2016-11-28 DIAGNOSIS — R0602 Shortness of breath: Secondary | ICD-10-CM | POA: Diagnosis not present

## 2016-11-28 DIAGNOSIS — Z79899 Other long term (current) drug therapy: Secondary | ICD-10-CM | POA: Diagnosis not present

## 2016-11-28 DIAGNOSIS — Z9861 Coronary angioplasty status: Secondary | ICD-10-CM | POA: Diagnosis not present

## 2016-11-28 DIAGNOSIS — I340 Nonrheumatic mitral (valve) insufficiency: Secondary | ICD-10-CM | POA: Diagnosis not present

## 2016-11-28 DIAGNOSIS — Z87891 Personal history of nicotine dependence: Secondary | ICD-10-CM | POA: Diagnosis not present

## 2016-11-28 DIAGNOSIS — R778 Other specified abnormalities of plasma proteins: Secondary | ICD-10-CM | POA: Diagnosis present

## 2016-11-28 DIAGNOSIS — R748 Abnormal levels of other serum enzymes: Secondary | ICD-10-CM | POA: Diagnosis not present

## 2016-11-28 LAB — BASIC METABOLIC PANEL
ANION GAP: 9 (ref 5–15)
Anion gap: 9 (ref 5–15)
BUN: 33 mg/dL — ABNORMAL HIGH (ref 6–20)
BUN: 31 mg/dL — ABNORMAL HIGH (ref 6–20)
CALCIUM: 9.7 mg/dL (ref 8.9–10.3)
CO2: 33 mmol/L — ABNORMAL HIGH (ref 22–32)
CO2: 35 mmol/L — ABNORMAL HIGH (ref 22–32)
Calcium: 9.2 mg/dL (ref 8.9–10.3)
Chloride: 95 mmol/L — ABNORMAL LOW (ref 101–111)
Chloride: 94 mmol/L — ABNORMAL LOW (ref 101–111)
Creatinine, Ser: 1.2 mg/dL — ABNORMAL HIGH (ref 0.44–1.00)
Creatinine, Ser: 1.22 mg/dL — ABNORMAL HIGH (ref 0.44–1.00)
GFR calc Af Amer: 46 mL/min — ABNORMAL LOW (ref >60)
GFR, EST AFRICAN AMERICAN: 45 mL/min — AB (ref >60)
GFR, EST NON AFRICAN AMERICAN: 40 mL/min — AB (ref >60)
GFR, EST NON AFRICAN AMERICAN: 39 mL/min — AB (ref >60)
GLUCOSE: 296 mg/dL — AB (ref 65–99)
Glucose, Bld: 238 mg/dL — ABNORMAL HIGH (ref 65–99)
POTASSIUM: 5.9 mmol/L — AB (ref 3.5–5.1)
Potassium: 3.8 mmol/L (ref 3.5–5.1)
Sodium: 137 mmol/L (ref 135–145)
Sodium: 138 mmol/L (ref 135–145)

## 2016-11-28 LAB — URINALYSIS, ROUTINE W REFLEX MICROSCOPIC
BACTERIA UA: NONE SEEN
BILIRUBIN URINE: NEGATIVE
Glucose, UA: NEGATIVE mg/dL
Hgb urine dipstick: NEGATIVE
KETONES UR: NEGATIVE mg/dL
LEUKOCYTES UA: NEGATIVE
Nitrite: NEGATIVE
PH: 5 (ref 5.0–8.0)
Protein, ur: 100 mg/dL — AB
Specific Gravity, Urine: 1.008 (ref 1.005–1.030)

## 2016-11-28 LAB — PROTIME-INR
INR: 1.37
Prothrombin Time: 17 seconds — ABNORMAL HIGH (ref 11.4–15.2)

## 2016-11-28 LAB — CBC
HCT: 35.2 % — ABNORMAL LOW (ref 36.0–46.0)
HEMOGLOBIN: 10.9 g/dL — AB (ref 12.0–15.0)
MCH: 30.9 pg (ref 26.0–34.0)
MCHC: 31 g/dL (ref 30.0–36.0)
MCV: 99.7 fL (ref 78.0–100.0)
Platelets: 199 10*3/uL (ref 150–400)
RBC: 3.53 MIL/uL — ABNORMAL LOW (ref 3.87–5.11)
RDW: 13 % (ref 11.5–15.5)
WBC: 7 10*3/uL (ref 4.0–10.5)

## 2016-11-28 LAB — I-STAT TROPONIN, ED: Troponin i, poc: 0.05 ng/mL (ref 0.00–0.08)

## 2016-11-28 LAB — BRAIN NATRIURETIC PEPTIDE: B NATRIURETIC PEPTIDE 5: 1388.3 pg/mL — AB (ref 0.0–100.0)

## 2016-11-28 LAB — TROPONIN I: TROPONIN I: 0.07 ng/mL — AB (ref <0.03)

## 2016-11-28 MED ORDER — FUROSEMIDE 10 MG/ML IJ SOLN
40.0000 mg | Freq: Once | INTRAMUSCULAR | Status: AC
  Administered 2016-11-28: 40 mg via INTRAVENOUS
  Filled 2016-11-28: qty 4

## 2016-11-28 NOTE — ED Notes (Signed)
Patient able to use bedside commode with one assist.

## 2016-11-28 NOTE — ED Triage Notes (Signed)
Pt O2 sats 88% on her home O2 at 3 L when she was switched to the portable O2 tank her sats improved to 96%-100%.

## 2016-11-28 NOTE — H&P (Addendum)
[name] BSW:[id number] DOB: [birth year] DOA: 11/28/2016     PCP: Chipper Herb, MD   Outpatient Specialists: Cardiology used to see Dr. Gwenlyn Found, Pulmonology [place]  Patient coming from: home Lives alone,        Chief Complaint: Shortness of breath debility  HPI: [name] is a 83 y.o. female with medical history significant of remote CAD, PAF on Amiodarone and Coumadin, COPD on home O2, and chronic diastolic failure    Presented with Progressive shortness of breath and leg swelling for some time she's been using compression stockings at some point developed ulcers which have healed since. Currently no drainage from lower extremities. Her shortness of breath has progressed to the point where patient have had trouble ambulating across the room. Lower extremity edema has been getting worse. At home her oxygen saturation dropped to 75% despite being on oxygen she has had frequent intermittent chest pain which is consistent with her history of angina and uses nitroglycerin for that as needed last time she had to use it was last night. Chest pain associated with sweats and nausea.   Currently chest pain-free. Her leg edema has been chronic she have had Dopplers in the past which were unremarkable Regarding pertinent Chronic problems: Last admission for CHF was in February 2018 Her wgt fluctuates between 115-119 lbs In March her torsemide was increased to 40 twice a day she has improved. Last echogram in February with preserved EF and grade 3 diastolic dysfunction Has known history of COPD on 2 L at baseline History of atrial fibrillation anticoagulated with Coumadin Own history of diabetes on insulin. Regarding history of coronary artery disease last card for presentation was in 2011 showed unchanged anatomy recommended medical therapy.  IN ER:  Temp (24hrs), Avg:97.6 F (36.4 C), Min:97.4 F (36.3 C), Max:97.8 F (36.6 C)      on arrival  ED Triage Vitals  Enc Vitals Group   BP 11/28/16 1641 (!) 148/77     Pulse Rate 11/28/16 1641 73     Resp 11/28/16 1641 (!) 24     Temp 11/28/16 1646 97.4 F (36.3 C)     Temp Source 11/28/16 1646 Oral     SpO2 11/28/16 1641 (!) 88 %     Weight 11/28/16 1705 119 lb 6.4 oz (54.2 kg)     Height 11/28/16 1653 5' (1.524 m)     Head Circumference --      Peak Flow --      Pain Score 11/28/16 1646 1     Pain Loc --      Pain Edu? --      Excl. in [place]? --     Last vitals: RR 24, Oxygen 100%, HR 72 BP 151/73 Na 138 K 3.8 CO2 35 Cr 1.22 at baseline INR 1.37 Trop 0.05 .> 0.07 WBC 7.0 Hg 10.9 plt 199  CXR; 30 megaly pulmonary vascular congestion bilateral infiltrates probably represent pulmonary edema Following Medications were ordered in ER: Medications  furosemide (LASIX) injection 40 mg (40 mg Intravenous Given 11/28/16 1916)     Hospitalist was called for admission for Acute on chronic diastolic CHF exacerbation  Review of Systems:    Pertinent positives include:  shortness of breath at rest.  dyspnea on exertion, Bilateral lower extremity swelling   Constitutional:  No weight loss, night sweats, Fevers, chills, fatigue, weight loss  HEENT:  No headaches, Difficulty swallowing,Tooth/dental problems,Sore throat,  No sneezing, itching, ear ache, nasal congestion, post nasal drip,  Cardio-vascular:  No chest pain, Orthopnea, PND, anasarca, dizziness, palpitations.no  GI:  No heartburn, indigestion, abdominal pain, nausea, vomiting, diarrhea, change in bowel habits, loss of appetite, melena, blood in stool, hematemesis Resp:  no No excess mucus, no productive cough, No non-productive cough, No coughing up of blood.No change in color of mucus.No wheezing. Skin:  no rash or lesions. No jaundice GU:  no dysuria, change in color of urine, no urgency or frequency. No straining to urinate.  No flank pain.  Musculoskeletal:  No joint pain or no joint swelling. No decreased range of motion. No back pain.  Psych:  No  change in mood or affect. No depression or anxiety. No memory loss.  Neuro: no localizing neurological complaints, no tingling, no weakness, no double vision, no gait abnormality, no slurred speech, no confusion  As per HPI otherwise 10 point review of systems negative.   Past Medical History: Past Medical History:  Diagnosis Date  . Anxiety   . Bradycardia    Torpol stopped  . CAD (coronary artery disease)    Cath in 2006 showed an occluded RCA with left-to-right collaterals & otherwise noncritical CAD with EF=25-30% at that time. EF subsequently improved to normal by 2D ECHO. Cath Aug. 2011 showed unchanged anatomy. > medical therapy recommended  . Carotid artery disease (HCC)    Moderate, right greater than left which we following by duplex ultrasound. [place] 12/05/11 = RIght Bulb/Prox ICA: Moderate to severe amt of fibrous plaque elevating velocities w/in prox segment of ICA. Consistent w/a 50-69% diameter reduction. Left Bulb/Prox ICA: Moderate amt of fibrous plaque slightly elevating velocities w/in the prox segment of the ICA. Consistent w/a 0-49% diameter reduction.   . Chronic lower back pain   . COPD (chronic obstructive pulmonary disease) (HCC)    GOLD 4 - PFT 06/08/10 FEV1 0.93 (62%), FEV1% 60, TLC 2.84 (69%0, DLCO 54%, +BD) On home O2.  . Diastolic CHF [facility])    hospitalized last Feb 2018  . Diastolic dysfunction    Grade II  . Fall during current hospitalization    "was at [facility]; fell; transferred to [facility]" (01/13/2015)  . GERD (gastroesophageal reflux disease)   . Hyperlipidemia   . Hypertension   . Hypoxemic respiratory failure, chronic (HCC)    uses 2.5 liters of oxygen with sleep  . Lower extremity edema    ECHO 07/21/12 = EF 55-60%, performed for TIA. Responding to diurectics. Continue diuresis w/ a goal dry weight of <160lb. Venous Duplex 07/27/11 = Right lower extremity: no evidence of thrombus or thrombophlebitis. Essentially normal right lower extremity venous duplex  Doppler evaluation.  . Myocardial infarction [facility])    "she's had several" (01/13/2015)  . On home oxygen therapy    "?L; prn" (01/13/2015)  . PAF (paroxysmal atrial fibrillation) (HCC)    In the past, on coumadin  . RBBB (right bundle branch block)    Chronic  . Secondary pulmonary hypertension   . Sleep apnea   . Stroke [facility])    "/CT scan; ~ 2014; didn't even know she'd had it"  (01/13/2015)  . Type II diabetes mellitus (HCC)    goal A1C is 8, to avoid hypoglycemia   Past Surgical History:  Procedure Laterality Date  . CARDIAC CATHETERIZATION  2006 & 2011   Cath in 2006 showed an occluded RCA with left-to-right collaterals & otherwise noncritical CAD with EF=25-30% at that time. EF subsequently improved to normal by 2D ECHO. Cath Aug. 2011 showed unchanged anatomy. > medical therapy  recommended.  . Carotid Duplex  12/05/11   Moderate, right greater than left which we following by duplex ultrasound. [place] 12/05/11 = RIght Bulb/Prox ICA: Moderate to severe amt of fibrous plaque elevating velocities w/in prox segment of ICA. Consistent w/a 50-69% diameter reduction. Left Bulb/Prox ICA: Moderate amt of fibrous plaque slightly elevating velocities w/in the prox segment of the ICA. Consistent w/a 0-49% diameter reduction.   Marland Kitchen CATARACT EXTRACTION W/ INTRAOCULAR LENS  IMPLANT, BILATERAL Bilateral   . CORONARY ANGIOPLASTY    . CORONARY ANGIOPLASTY WITH STENT PLACEMENT     x2  . FEMUR IM NAIL Left 01/15/2015   Procedure: INTRAMEDULLARY (IM) NAIL FEMORAL LEFT ;  Surgeon: Netta Cedars, MD;  Location: [place];  Service: Orthopedics;  Laterality: Left;  . SHOULDER OPEN ROTATOR CUFF REPAIR Right 07/2005  . TONSILLECTOMY    . TOTAL ABDOMINAL HYSTERECTOMY  ~ 1967  . TRANSVAGINAL TAPE (TVT) REMOVAL    . Venous Duplex  07/27/11   Venous Duplex 07/27/11 = Right lower extremity: no evidence of thrombus or thrombophlebitis. Essentially normal right lower extremity venous duplex Doppler evaluation.     Social  History:  Ambulatory   Cane or walker   reports that she has quit smoking. Her smoking use included Cigarettes. She has a 22.50 pack-year smoking history. She has never used smokeless tobacco. She reports that she drinks about 3.6 oz of alcohol per week . She reports that she does not use drugs.  Allergies:   Allergies  Allergen Reactions  . Atorvastatin Other (See Comments)     muscle aches  . Codeine Nausea And Vomiting  . Morphine Nausea And Vomiting  . Other Other (See Comments)    OPIATES cause nausea and vomiting  . Rosuvastatin Other (See Comments)    muscle aches at high doses, held as of 12/2010 due to aches  . Sulfa Antibiotics Nausea And Vomiting  . Iohexol Other (See Comments)     Desc: unknown reaction; allergic to iodine and contrast        Family History:   Family History  Problem Relation Age of Onset  . Cancer Mother        Skin  . Heart disease Mother   . Emphysema Father   . Heart disease Father   . Congestive Heart Failure Father   . Heart disease Brother   . Heart disease Brother   . Early death Sister     Medications: Prior to Admission medications   Medication Sig Start Date End Date Taking? Authorizing Provider  ACCU-CHEK AVIVA PLUS test strip CHECK BLOOD SUGER UP TO 3 TIMES A DAY 11/08/16  Yes Chipper Herb, MD  acetaminophen (TYLENOL) 500 MG tablet Take 500 mg by mouth every 4 (four) hours as needed for mild pain.    Yes [provider]  amiodarone (PACERONE) 200 MG tablet Take 0.5 tablets (100 mg total) by mouth daily. 09/09/16  Yes Lorretta Harp, MD  aspirin EC 81 MG tablet Take 81 mg by mouth daily with breakfast.    Yes [provider]  atorvastatin (LIPITOR) 20 MG tablet TAKE 1 TABLET DAILY IN THE EVENING 09/06/16  Yes Chipper Herb, MD  Azelastine HCl 0.15 % SOLN Place 1 spray into both nostrils 2 (two) times daily as needed (congestion).   Yes [provider]  baclofen (LIORESAL) 10 MG tablet TAKE 1/2  TABLET EVERY 8 HOURS AS NEEDED FOR MUSCLE SPASM Patient taking differently: TAKE 5MG  TABLET EVERY 8 HOURS AS  NEEDED FOR MUSCLE SPASM 07/04/16  Yes Timmothy Euler, MD  budesonide-formoterol [facility]) 160-4.5 MCG/ACT inhaler Inhale 1 puff into the lungs 2 (two) times daily. Patient taking differently: Inhale 1 puff into the lungs 2 (two) times daily as needed (wheezing).  01/24/14  Yes Chipper Herb, MD  Cholecalciferol (VITAMIN D3) 2000 units TABS Take 2,000 Units by mouth daily.   Yes [provider]  docusate sodium (COLACE) 100 MG capsule Take 100 mg by mouth daily as needed for moderate constipation.    Yes [provider]  ferrous sulfate 325 (65 FE) MG tablet TAKE 1 TABLET ONCE DAILY WITH BREAKFAST 02/22/16  Yes Chipper Herb, MD  fluticasone [facility]) 50 MCG/ACT nasal spray Place 1 spray into both nostrils 2 (two) times daily. Patient taking differently: Place 1 spray into both nostrils as needed.  12/24/13  Yes Chesley Mires, MD  GuaiFENesin (CHEST CONGESTION RELIEF PO) Take 1-1.5 tablets by mouth as needed.    Yes [provider]  hydrocortisone (ANUSOL-HC) 25 MG suppository Place 1 suppository (25 mg total) rectally 2 (two) times daily as needed for hemorrhoids or itching. 07/30/15  Yes Dettinger, Fransisca Kaufmann, MD  Insulin Detemir (LEVEMIR FLEXTOUCH) 100 UNIT/ML Pen INJECT 8 units once a day each morning 08/12/16  Yes Dessa Phi Chahn-Yang, DO  Insulin Pen Needle (PEN NEEDLES) 31G X 6 MM MISC USE AS DIRECTED UP TO 4 TIMES A DAY 11/18/16  Yes Chipper Herb, MD  Ipratropium-Albuterol (COMBIVENT) 20-100 MCG/ACT AERS respimat Inhale 1 puff into the lungs every 6 (six) hours as needed for wheezing or shortness of breath. 01/24/14  Yes Chipper Herb, MD  Multiple Vitamins-Minerals (CENTRUM ADULTS) TABS Take 1 tablet by mouth daily with breakfast.   Yes [provider]  neomycin-bacitracin-polymyxin (NEOSPORIN) [phone] ointment Apply 1 application topically  as needed (ulcer on the legs).   Yes [provider]  NIFEdipine (PROCARDIA-XL/ADALAT-CC/NIFEDICAL-XL) 30 MG 24 hr tablet Take 1 tablet (30 mg total) by mouth daily. 09/09/16  Yes Lorretta Harp, MD  ondansetron (ZOFRAN ODT) 4 MG disintegrating tablet Take 1 tablet (4 mg total) by mouth every 8 (eight) hours as needed for nausea or vomiting. 05/08/16  Yes Fredia Sorrow, MD  OXYGEN Inhale 3-5 L into the lungs daily.    Yes [provider]  pantoprazole (PROTONIX) 40 MG tablet TAKE 1 TABLET DAILY Patient taking differently: TAKE 40 MG TABLET DAILY 08/17/16  Yes Chipper Herb, MD  polyethylene glycol [facility] Floria Raveling) packet Take 17 g by mouth 2 (two) times daily as needed for moderate constipation. 05/17/16  Yes Virgel Manifold, MD  potassium chloride SA (K-DUR,KLOR-CON) 20 MEQ tablet TAKE 1 TABLET DAILY 10/26/16  Yes Chipper Herb, MD  PRODIGY TWIST TOP LANCETS 28G MISC CHECK BLOOD SUGAR UP TO 4 TIMES A DAY 05/23/16  Yes Chipper Herb, MD  RESTASIS MULTIDOSE 0.05 % ophthalmic emulsion  09/14/16  Yes [provider]  simethicone (MYLICON) 80 MG chewable tablet Chew 80 mg by mouth at bedtime as needed for flatulence.   Yes [provider]  torsemide (DEMADEX) 20 MG tablet 2 tab po BID, one week take 3 tab in AM and 2 tab in PM 10/12/16  Yes Jones, Londell Moh, PA-C  vitamin C (ASCORBIC ACID) 500 MG tablet Take 500 mg by mouth at bedtime.    Yes [provider]  warfarin (COUMADIN) 4 MG tablet Take 1 tab Mon and Fri. 1.5 tablets all other days. 10/25/16  Yes Chipper Herb, MD  nitroGLYCERIN (NITROSTAT) 0.4 MG SL tablet Place 1 tablet (0.4 mg total) under the tongue every 5 (five) minutes as needed for chest pain. 08/12/16   Dessa Phi Chahn-Yang, DO  traMADol (ULTRAM) 50 MG tablet Take 1 tablet (50 mg total) by mouth every 6 (six) hours as needed for severe pain. 12/26/15   Julianne Rice, MD    Physical Exam: Patient Vitals for the past 24 hrs:  BP  Temp Temp src Pulse Resp SpO2 Height Weight  11/28/16 2145 (!) 151/73 - - 72 (!) 24 100 % - -  11/28/16 2130 (!) 150/66 - - 70 (!) 22 - - -  11/28/16 2054 (!) 149/81 - - 79 20 100 % - -  11/28/16 2015 (!) 148/74 - - 72 (!) 25 100 % - -  11/28/16 1915 (!) 147/73 - - 72 15 100 % - -  11/28/16 1905 (!) 143/74 97.8 F (36.6 C) Oral 71 16 92 % - -  11/28/16 1705 - - - - - - - 54.2 kg (119 lb 6.4 oz)  11/28/16 1653 - - - - - - 5' (1.524 m) -  11/28/16 1646 - 97.4 F (36.3 C) Oral - - 100 % - -  11/28/16 1641 (!) 148/77 - - 73 (!) 24 (!) 88 % - -    1. General:  in No Acute distress 2. Psychological: Alert and  Oriented 3. Head/ENT:   Moist   Mucous Membranes                          Head Non traumatic, neck supple                           Poor Dentition 4. SKIN: normal    Skin turgor,  Skin clean Dry and intact no rash 5. Heart: Regular rate and rhythm no Murmur, Rub or gallop 6. Lungs  no wheezes some  crackles   7. Abdomen: Soft,  non-tender, Non distended 8. Lower extremities: no clubbing, cyanosis, 2+ dema 9. Neurologically Grossly intact, moving all 4 extremities equally  10. MSK: Normal range of motion   body mass index is 23.32 kg/m.  Labs on Admission:   Labs on Admission: I have personally reviewed following labs and imaging studies  CBC:  Recent Labs Lab 11/28/16 1653  WBC 7.0  HGB 10.9*  HCT 35.2*  MCV 99.7  PLT 973   Basic Metabolic Panel:  Recent Labs Lab 11/28/16 1653 11/28/16 1918  NA 137 138  K 5.9* 3.8  CL 95* 94*  CO2 33* 35*  GLUCOSE 296* 238*  BUN 33* 31*  CREATININE 1.20* 1.22*  CALCIUM 9.2 9.7   GFR: Estimated Creatinine Clearance: 23.8 mL/min (A) (by C-G formula based on SCr of 1.22 mg/dL (H)). Liver Function Tests: No results for input(s): AST, ALT, ALKPHOS, BILITOT, PROT, ALBUMIN in the last 168 hours. No results for input(s): LIPASE, AMYLASE in the last 168 hours. No results for input(s): AMMONIA in the last 168  hours. Coagulation Profile:  Recent Labs Lab 11/28/16 1918  INR 1.37   Cardiac Enzymes: No results for input(s): CKTOTAL, CKMB, CKMBINDEX, TROPONINI in the last 168 hours. BNP (last 3 results) No results for input(s): PROBNP in the last 8760 hours. HbA1C: No results for input(s): HGBA1C in the last 72 hours. CBG: No results for input(s): GLUCAP in the last 168 hours. Lipid  Profile: No results for input(s): CHOL, HDL, LDLCALC, TRIG, CHOLHDL, LDLDIRECT in the last 72 hours. Thyroid Function Tests: No results for input(s): TSH, T4TOTAL, FREET4, T3FREE, THYROIDAB in the last 72 hours. Anemia Panel: No results for input(s): VITAMINB12, FOLATE, FERRITIN, TIBC, IRON, RETICCTPCT in the last 72 hours. Urine analysis:  Sepsis Labs: @LABRCNTIP (procalcitonin:4,lacticidven:4) )No results found for this or any previous visit (from the past 240 hour(s)).    UA ordered  Lab Results  Component Value Date   HGBA1C 7.2 (H) 04/24/2016    Estimated Creatinine Clearance: 23.8 mL/min (A) (by C-G formula based on SCr of 1.22 mg/dL (H)).  BNP (last 3 results) 1300  ECG REPORT  Independently reviewed Rate: 72  Rhythm: RBBB ST&T Change: Flattened T waves QTC 438  Filed Weights   11/28/16 1705  Weight: 54.2 kg (119 lb 6.4 oz)     Cultures:    Component Value Date/Time   SDES URINE, RANDOM 08/20/2008 1537   SPECREQUEST NONE 08/20/2008 1537   CULT NO GROWTH 5 DAYS 08/20/2008 1050   REPTSTATUS 08/21/2008 FINAL 08/20/2008 1537     Radiological Exams on Admission: Dg Chest 2 View  Result Date: 11/28/2016 CLINICAL DATA:  Chest pain and shortness of breath.  COPD.  Hypoxia. EXAM: CHEST  2 VIEW COMPARISON:  08/07/2016 FINDINGS: Is chronic cardiomegaly. There is new pulmonary vascular congestion with bilateral pulmonary infiltrates, more prominent in the upper lobes, as well as bilateral small pleural effusions. There is tortuosity and calcification of the thoracic aorta. The lungs are  hyperinflated with flattening of the diaphragm. IMPRESSION: Cardiomegaly with pulmonary vascular congestion and bilateral infiltrates, probably representing pulmonary edema. The pattern of upper lobe predominance is atypical. Is the patient febrile? Emphysema.  Small effusions. Electronically Signed   By: Lorriane Shire M.D.   On: 11/28/2016 17:43    Chart has been reviewed    Assessment/Plan   83 y.o. female with medical history significant of remote CAD, PAF on Amiodarone and Coumadin, COPD on home O2, and chronic diastolic failure Admitted for Acute on chronic diastolic CHF exacerbation   Present on Admission: . Chest pain  - currently resolved but worrisome for angina, continue to cycle CE, This troponin slightly elevated monitor the trend. Currently chest pain-free, obtain echogram and appreciate cardiology consult . Acute diastolic CHF (congestive heart failure) ([place]) - - admit on telemetry, cycle cardiac enzymes, obtain serial ECG, to evaluate for ischemia as a cause of heart failure  monitor daily weight  diurese with IV lasix and monitor orthostatics and creatinine to avoid over diuresis.  Order echogram to evaluate EF and valves Hold off  ACE/ARBi preserved EF cardiology consult  . Elevated troponin - in the setting of diastolic CHF exacerbation could be secondary to demand ischemia but also patient have had intermittent chest pain at all today. She cardiology input, monitor trend  . Atherosclerotic peripheral vascular disease ([place]) - stable currently asymptomatic . Chronic respiratory failure with hypoxia (HCC) - worsening respiratory failure likely secondary to CHF exacerbation currently back to baseline . CKD stage 3 secondary to diabetes ([place]) - currently stable continue to monitor creatinine . COPD with emphysema ([place]) - stable continue home medications currently  no wheezing to suggest COPD exacerbation . DM type 2 causing CKD stage 3 ([place]) - continue home medications or  sliding scale . HTN (hypertension) - stable continue home medications . Hyperlipidemia LDL goal <70 - stable continue statin . PVD (peripheral vascular disease), moderate carotid and LSCA disease continue aspirin .  PAF (paroxysmal atrial fibrillation) (HCC) -         - CHA2DS2 vas score 6 : continue current anticoagulation with Coumadin per pharmacy,   subtherapeutic given somewhat elevated troponin and chest pain we'll bridge with heparin. In the future will need to have discussion with the patient regarding risk of falls and being on anticoagulation, defer to cardiology      -  Rate control: In the past could not tolerate beta blocker secondary to bradycardia will continue to hold        - Rhythm control:Continue amiodarone currently in sinus Risk of falls- fall precaution    Other plan as per orders.  DVT prophylaxis: Coumadin  Code Status:  FULL CODE as per patient    Family Communication:   Family   at  Bedside  plan of care was discussed with  Daughter,  Disposition Plan: possibly will need placement for rehabilitation but hesitant                          Back to current facility when stable                            To home once workup is complete and patient is stable                       Would benefit from PT/OT eval prior to DC ordered                        Diabetes coordinator    Nutrition  consulted                          Consults called: email cardiology    Admission status:  inpatient      Level of care    tele           I have spent a total of 56 min on this admission   Darvin Dials 11/29/2016, 12:03 AM  [facility]  Pager [phone]   after 2 AM please page floor coverage PA If 7AM-7PM, please contact the day team taking care of the patient  [email or web address]  Password [email or web address]

## 2016-11-28 NOTE — ED Notes (Signed)
Admitting at bedside 

## 2016-11-28 NOTE — ED Notes (Signed)
Dr. Roel Cluck aware of troponin

## 2016-11-28 NOTE — ED Triage Notes (Signed)
Pt reports to the ED for eval of intermittent CP and SOB. She states that she has hx of COPD and CHF. She states that she has had intermittent SOB since she was last admitted to the hospital. She also reports BLE swelling since that date as well. However she states that her O2 sats dropped today to 75%. She states that she normally wears O2 at home most of the time. She states that she has frequent angina. Last nitro use was last night. She states her CP is not too severe at this time.

## 2016-11-28 NOTE — ED Provider Notes (Signed)
[facility] DEPT Provider Note   CSN: [id number] Arrival date & time: 11/28/16  1632     History   Chief Complaint Chief Complaint  Patient presents with  . Shortness of Breath  . Chest Pain    HPI [name] is a 83 y.o. female.  HPI Patient presents for shortness of breath the last few days. History of COPD and CHF. Has intermittent oxygen home she's been taking more frequently. Has worsening swelling of her legs. Sats today went down to 75%. Has had occasional chest pain. No pain now. No cough. No fevers. Also has had some nausea.   Past Medical History:  Diagnosis Date  . Anxiety   . Bradycardia    Torpol stopped  . CAD (coronary artery disease)    Cath in 2006 showed an occluded RCA with left-to-right collaterals & otherwise noncritical CAD with EF=25-30% at that time. EF subsequently improved to normal by 2D ECHO. Cath Aug. 2011 showed unchanged anatomy. > medical therapy recommended  . Carotid artery disease (HCC)    Moderate, right greater than left which we following by duplex ultrasound. [place] 12/05/11 = RIght Bulb/Prox ICA: Moderate to severe amt of fibrous plaque elevating velocities w/in prox segment of ICA. Consistent w/a 50-69% diameter reduction. Left Bulb/Prox ICA: Moderate amt of fibrous plaque slightly elevating velocities w/in the prox segment of the ICA. Consistent w/a 0-49% diameter reduction.   . Chronic lower back pain   . COPD (chronic obstructive pulmonary disease) (HCC)    GOLD 4 - PFT 06/08/10 FEV1 0.93 (62%), FEV1% 60, TLC 2.84 (69%0, DLCO 54%, +BD) On home O2.  . Diastolic CHF [facility])    hospitalized last Feb 2018  . Diastolic dysfunction    Grade II  . Fall during current hospitalization    "was at [facility]; fell; transferred to [facility]" (01/13/2015)  . GERD (gastroesophageal reflux disease)   . Hyperlipidemia   . Hypertension   . Hypoxemic respiratory failure, chronic (HCC)    uses 2.5 liters of oxygen with sleep  . Lower extremity edema    ECHO 07/21/12 = EF 55-60%, performed for TIA. Responding to diurectics. Continue diuresis w/ a goal dry weight of <160lb. Venous Duplex 07/27/11 = Right lower extremity: no evidence of thrombus or thrombophlebitis. Essentially normal right lower extremity venous duplex Doppler evaluation.  . Myocardial infarction [facility])    "she's had several" (01/13/2015)  . On home oxygen therapy    "?L; prn" (01/13/2015)  . PAF (paroxysmal atrial fibrillation) (HCC)    In the past, on coumadin  . RBBB (right bundle branch block)    Chronic  . Secondary pulmonary hypertension   . Sleep apnea   . Stroke [facility])    "/CT scan; ~ 2014; didn't even know she'd had it"  (01/13/2015)  . Type II diabetes mellitus (HCC)    goal A1C is 8, to avoid hypoglycemia    Patient Active Problem List   Diagnosis Date Noted  . Anemia 08/08/2016  . Microalbuminuria 06/30/2016  . CKD stage 3 secondary to diabetes ([place]) 05/02/2016  . Atherosclerotic peripheral vascular disease ([place]) 11/04/2015  . Osteopenia 01/05/2015  . Primary osteoarthritis involving multiple joints 06/12/2014  . CHF (congestive heart failure) ([place]) 12/28/2013  . DM type 2 causing CKD stage 3 ([place]) 07/30/2012  . Ataxia 07/20/2012  . Weakness of both legs 07/20/2012  . Falls frequently 07/20/2012  . RBBB 08/25/2011  . CAD S/P percutaneous coronary angioplasty 08/25/2011  . PVD (peripheral vascular disease), moderate carotid and  LSCA disease 08/25/2011  . HTN (hypertension) 08/22/2011  . Gait instability 10/11/2010  . PAF (paroxysmal atrial fibrillation) ([place]) 07/06/2010  . COPD with emphysema ([place]) 06/16/2010  . Chronic respiratory failure with hypoxia ([place]) 06/07/2010  . Hyperlipidemia LDL goal <70 02/11/2010    Past Surgical History:  Procedure Laterality Date  . CARDIAC CATHETERIZATION  2006 & 2011   Cath in 2006 showed an occluded RCA with left-to-right collaterals & otherwise noncritical CAD with EF=25-30% at that time. EF subsequently improved to  normal by 2D ECHO. Cath Aug. 2011 showed unchanged anatomy. > medical therapy recommended.  . Carotid Duplex  12/05/11   Moderate, right greater than left which we following by duplex ultrasound. [place] 12/05/11 = RIght Bulb/Prox ICA: Moderate to severe amt of fibrous plaque elevating velocities w/in prox segment of ICA. Consistent w/a 50-69% diameter reduction. Left Bulb/Prox ICA: Moderate amt of fibrous plaque slightly elevating velocities w/in the prox segment of the ICA. Consistent w/a 0-49% diameter reduction.   Marland Kitchen CATARACT EXTRACTION W/ INTRAOCULAR LENS  IMPLANT, BILATERAL Bilateral   . CORONARY ANGIOPLASTY    . CORONARY ANGIOPLASTY WITH STENT PLACEMENT     x2  . FEMUR IM NAIL Left 01/15/2015   Procedure: INTRAMEDULLARY (IM) NAIL FEMORAL LEFT ;  Surgeon: Netta Cedars, MD;  Location: [facility];  Service: Orthopedics;  Laterality: Left;  . SHOULDER OPEN ROTATOR CUFF REPAIR Right 07/2005  . TONSILLECTOMY    . TOTAL ABDOMINAL HYSTERECTOMY  ~ 1967  . TRANSVAGINAL TAPE (TVT) REMOVAL    . Venous Duplex  07/27/11   Venous Duplex 07/27/11 = Right lower extremity: no evidence of thrombus or thrombophlebitis. Essentially normal right lower extremity venous duplex Doppler evaluation.    OB History    No data available       Home Medications    Prior to Admission medications   Medication Sig Start Date End Date Taking? Authorizing Provider  ACCU-CHEK AVIVA PLUS test strip CHECK BLOOD SUGER UP TO 3 TIMES A DAY 11/08/16  Yes Chipper Herb, MD  acetaminophen (TYLENOL) 500 MG tablet Take 500 mg by mouth every 4 (four) hours as needed for mild pain.    Yes [provider]  amiodarone (PACERONE) 200 MG tablet Take 0.5 tablets (100 mg total) by mouth daily. 09/09/16  Yes Lorretta Harp, MD  aspirin EC 81 MG tablet Take 81 mg by mouth daily with breakfast.    Yes [provider]  atorvastatin (LIPITOR) 20 MG tablet TAKE 1 TABLET DAILY IN THE EVENING 09/06/16  Yes Chipper Herb, MD    Azelastine HCl 0.15 % SOLN Place 1 spray into both nostrils 2 (two) times daily as needed (congestion).   Yes [provider]  baclofen (LIORESAL) 10 MG tablet TAKE 1/2 TABLET EVERY 8 HOURS AS NEEDED FOR MUSCLE SPASM Patient taking differently: TAKE 5MG  TABLET EVERY 8 HOURS AS NEEDED FOR MUSCLE SPASM 07/04/16  Yes Timmothy Euler, MD  budesonide-formoterol (SYMBICORT) 160-4.5 MCG/ACT inhaler Inhale 1 puff into the lungs 2 (two) times daily. Patient taking differently: Inhale 1 puff into the lungs 2 (two) times daily as needed (wheezing).  01/24/14  Yes Chipper Herb, MD  Cholecalciferol (VITAMIN D3) 2000 units TABS Take 2,000 Units by mouth daily.   Yes [provider]  docusate sodium (COLACE) 100 MG capsule Take 100 mg by mouth daily as needed for moderate constipation.    Yes [provider]  ferrous sulfate 325 (65 FE) MG tablet TAKE 1 TABLET  ONCE DAILY WITH BREAKFAST 02/22/16  Yes Chipper Herb, MD  fluticasone [facility]) 50 MCG/ACT nasal spray Place 1 spray into both nostrils 2 (two) times daily. Patient taking differently: Place 1 spray into both nostrils as needed.  12/24/13  Yes Chesley Mires, MD  GuaiFENesin (CHEST CONGESTION RELIEF PO) Take 1-1.5 tablets by mouth as needed.    Yes [provider]  hydrocortisone (ANUSOL-HC) 25 MG suppository Place 1 suppository (25 mg total) rectally 2 (two) times daily as needed for hemorrhoids or itching. 07/30/15  Yes Dettinger, Fransisca Kaufmann, MD  Insulin Detemir (LEVEMIR FLEXTOUCH) 100 UNIT/ML Pen INJECT 8 units once a day each morning 08/12/16  Yes Dessa Phi Chahn-Yang, DO  Insulin Pen Needle (PEN NEEDLES) 31G X 6 MM MISC USE AS DIRECTED UP TO 4 TIMES A DAY 11/18/16  Yes Chipper Herb, MD  Ipratropium-Albuterol (COMBIVENT) 20-100 MCG/ACT AERS respimat Inhale 1 puff into the lungs every 6 (six) hours as needed for wheezing or shortness of breath. 01/24/14  Yes Chipper Herb, MD  Multiple Vitamins-Minerals (CENTRUM  ADULTS) TABS Take 1 tablet by mouth daily with breakfast.   Yes [provider]  neomycin-bacitracin-polymyxin (NEOSPORIN) [id number] ointment Apply 1 application topically as needed (ulcer on the legs).   Yes [provider]  NIFEdipine (PROCARDIA-XL/ADALAT-CC/NIFEDICAL-XL) 30 MG 24 hr tablet Take 1 tablet (30 mg total) by mouth daily. 09/09/16  Yes Lorretta Harp, MD  ondansetron (ZOFRAN ODT) 4 MG disintegrating tablet Take 1 tablet (4 mg total) by mouth every 8 (eight) hours as needed for nausea or vomiting. 05/08/16  Yes Fredia Sorrow, MD  OXYGEN Inhale 3-5 L into the lungs daily.    Yes [provider]  pantoprazole (PROTONIX) 40 MG tablet TAKE 1 TABLET DAILY Patient taking differently: TAKE 40 MG TABLET DAILY 08/17/16  Yes Chipper Herb, MD  polyethylene glycol [facility] Floria Raveling) packet Take 17 g by mouth 2 (two) times daily as needed for moderate constipation. 05/17/16  Yes Virgel Manifold, MD  potassium chloride SA (K-DUR,KLOR-CON) 20 MEQ tablet TAKE 1 TABLET DAILY 10/26/16  Yes Chipper Herb, MD  PRODIGY TWIST TOP LANCETS 28G MISC CHECK BLOOD SUGAR UP TO 4 TIMES A DAY 05/23/16  Yes Chipper Herb, MD  RESTASIS MULTIDOSE 0.05 % ophthalmic emulsion  09/14/16  Yes [provider]  simethicone (MYLICON) 80 MG chewable tablet Chew 80 mg by mouth at bedtime as needed for flatulence.   Yes [provider]  torsemide (DEMADEX) 20 MG tablet 2 tab po BID, one week take 3 tab in AM and 2 tab in PM 10/12/16  Yes Jones, Londell Moh, PA-C  vitamin C (ASCORBIC ACID) 500 MG tablet Take 500 mg by mouth at bedtime.    Yes [provider]  warfarin (COUMADIN) 4 MG tablet Take 1 tab Mon and Fri. 1.5 tablets all other days. 10/25/16  Yes Chipper Herb, MD  nitroGLYCERIN (NITROSTAT) 0.4 MG SL tablet Place 1 tablet (0.4 mg total) under the tongue every 5 (five) minutes as needed for chest pain. 08/12/16   Dessa Phi Chahn-Yang, DO  traMADol (ULTRAM) 50 MG  tablet Take 1 tablet (50 mg total) by mouth every 6 (six) hours as needed for severe pain. 12/26/15   Julianne Rice, MD    Family History Family History  Problem Relation Age of Onset  . Cancer Mother        Skin  . Heart disease Mother   . Emphysema Father   . Heart  disease Father   . Congestive Heart Failure Father   . Heart disease Brother   . Heart disease Brother   . Early death Sister     Social History Social History  Substance Use Topics  . Smoking status: Former Smoker    Packs/day: 1.50    Years: 15.00    Types: Cigarettes  . Smokeless tobacco: Never Used     Comment: Smoked 1.5 packs per day for 15 years, quit in 1995.  Marland Kitchen Alcohol use 3.6 oz/week    6 Glasses of wine per week     Comment: 01/13/2015 "couple glasses of wine maybe 3 days/wk"     Allergies   Atorvastatin; Codeine; Morphine; Other; Rosuvastatin; Sulfa antibiotics; and Iohexol   Review of Systems Review of Systems  Constitutional: Positive for appetite change. Negative for fever.  HENT: Negative for congestion.   Respiratory: Positive for shortness of breath.   Cardiovascular: Positive for chest pain and leg swelling.  Gastrointestinal: Positive for nausea.  Genitourinary: Negative for dysuria.  Musculoskeletal: Negative for back pain.  Skin: Negative for wound.  Neurological: Negative for light-headedness.  Hematological: Negative for adenopathy.     Physical Exam Updated Vital Signs BP (!) 143/74 (BP Location: Right Arm)   Pulse 71   Temp 97.8 F (36.6 C) (Oral)   Resp 16   Ht 5' (1.524 m)   Wt 54.2 kg (119 lb 6.4 oz)   SpO2 92%   BMI 23.32 kg/m   Physical Exam  Constitutional: She appears well-developed.  HENT:  Head: Atraumatic.  Eyes: Pupils are equal, round, and reactive to light.  Neck: Neck supple.  Cardiovascular: Normal rate.   Pulmonary/Chest: She has rales.  Abdominal: There is no tenderness.  Musculoskeletal: She exhibits edema.  Moderate pitting edema  bilateral lower extremities.  Neurological: She is alert.  Skin: Skin is warm. Capillary refill takes less than 2 seconds.  Psychiatric: She has a normal mood and affect.     ED Treatments / Results  Labs (all labs ordered are listed, but only abnormal results are displayed) Labs Reviewed  BASIC METABOLIC PANEL - Abnormal; Notable for the following:       Result Value   Potassium 5.9 (*)    Chloride 95 (*)    CO2 33 (*)    Glucose, Bld 296 (*)    BUN 33 (*)    Creatinine, Ser 1.20 (*)    GFR calc non Af Amer 40 (*)    GFR calc Af Amer 46 (*)    All other components within normal limits  CBC - Abnormal; Notable for the following:    RBC 3.53 (*)    Hemoglobin 10.9 (*)    HCT 35.2 (*)    All other components within normal limits  BASIC METABOLIC PANEL - Abnormal; Notable for the following:    Chloride 94 (*)    CO2 35 (*)    Glucose, Bld 238 (*)    BUN 31 (*)    Creatinine, Ser 1.22 (*)    GFR calc non Af Amer 39 (*)    GFR calc Af Amer 45 (*)    All other components within normal limits  PROTIME-INR - Abnormal; Notable for the following:    Prothrombin Time 17.0 (*)    All other components within normal limits  BRAIN NATRIURETIC PEPTIDE  I-STAT TROPOININ, ED    EKG  EKG Interpretation  Date/Time:  Monday November 28 2016 16:41:57 EDT Ventricular Rate:  72 PR  Interval:  192 QRS Duration: 138 QT Interval:  400 QTC Calculation: 438 R Axis:   89 Text Interpretation:  Normal sinus rhythm Right bundle branch block T wave abnormality, consider inferior ischemia Abnormal ECG No significant change since last tracing Confirmed by Alvino Chapel  MD, Georgiana Spillane [phone]) on 11/28/2016 6:57:53 PM       Radiology Dg Chest 2 View  Result Date: 11/28/2016 CLINICAL DATA:  Chest pain and shortness of breath.  COPD.  Hypoxia. EXAM: CHEST  2 VIEW COMPARISON:  08/07/2016 FINDINGS: Is chronic cardiomegaly. There is new pulmonary vascular congestion with bilateral pulmonary infiltrates, more  prominent in the upper lobes, as well as bilateral small pleural effusions. There is tortuosity and calcification of the thoracic aorta. The lungs are hyperinflated with flattening of the diaphragm. IMPRESSION: Cardiomegaly with pulmonary vascular congestion and bilateral infiltrates, probably representing pulmonary edema. The pattern of upper lobe predominance is atypical. Is the patient febrile? Emphysema.  Small effusions. Electronically Signed   By: Lorriane Shire M.D.   On: 11/28/2016 17:43    Procedures Procedures (including critical care time)  Medications Ordered in ED Medications  furosemide (LASIX) injection 40 mg (40 mg Intravenous Given 11/28/16 1916)     Initial Impression / Assessment and Plan / ED Course  I have reviewed the triage vital signs and the nursing notes.  Pertinent labs & imaging results that were available during my care of the patient were reviewed by me and considered in my medical decision making (see chart for details).     Patient was shortness of breath. Appears to be in worsening CHF. EKG stable. Initial potassium artificially elevated due to hemolysis. Admit to internal medicine.  Final Clinical Impressions(s) / ED Diagnoses   Final diagnoses:  Acute on chronic congestive heart failure, unspecified heart failure type [facility])    New Prescriptions New Prescriptions   No medications on file     Davonna Belling, MD 11/28/16 2013

## 2016-11-29 ENCOUNTER — Other Ambulatory Visit: Payer: Self-pay | Admitting: Family Medicine

## 2016-11-29 ENCOUNTER — Inpatient Hospital Stay (HOSPITAL_COMMUNITY): Payer: Medicare Other

## 2016-11-29 ENCOUNTER — Encounter (HOSPITAL_COMMUNITY): Payer: Self-pay | Admitting: Cardiology

## 2016-11-29 ENCOUNTER — Other Ambulatory Visit: Payer: Self-pay

## 2016-11-29 DIAGNOSIS — I340 Nonrheumatic mitral (valve) insufficiency: Secondary | ICD-10-CM

## 2016-11-29 DIAGNOSIS — I5031 Acute diastolic (congestive) heart failure: Secondary | ICD-10-CM

## 2016-11-29 LAB — HEPARIN LEVEL (UNFRACTIONATED)
HEPARIN UNFRACTIONATED: 0.19 [IU]/mL — AB (ref 0.30–0.70)
Heparin Unfractionated: 0.37 IU/mL (ref 0.30–0.70)

## 2016-11-29 LAB — BASIC METABOLIC PANEL
ANION GAP: 11 (ref 5–15)
BUN: 26 mg/dL — ABNORMAL HIGH (ref 6–20)
CO2: 39 mmol/L — ABNORMAL HIGH (ref 22–32)
Calcium: 9.4 mg/dL (ref 8.9–10.3)
Chloride: 93 mmol/L — ABNORMAL LOW (ref 101–111)
Creatinine, Ser: 1.09 mg/dL — ABNORMAL HIGH (ref 0.44–1.00)
GFR calc Af Amer: 52 mL/min — ABNORMAL LOW (ref >60)
GFR, EST NON AFRICAN AMERICAN: 45 mL/min — AB (ref >60)
GLUCOSE: 163 mg/dL — AB (ref 65–99)
POTASSIUM: 3.4 mmol/L — AB (ref 3.5–5.1)
Sodium: 143 mmol/L (ref 135–145)

## 2016-11-29 LAB — TROPONIN I
Troponin I: 0.07 ng/mL (ref <0.03)
Troponin I: 0.06 ng/mL (ref <0.03)

## 2016-11-29 LAB — GLUCOSE, CAPILLARY
GLUCOSE-CAPILLARY: 231 mg/dL — AB (ref 65–99)
Glucose-Capillary: 179 mg/dL — ABNORMAL HIGH (ref 65–99)
Glucose-Capillary: 97 mg/dL (ref 65–99)
Glucose-Capillary: 93 mg/dL (ref 65–99)
Glucose-Capillary: 200 mg/dL — ABNORMAL HIGH (ref 65–99)
Glucose-Capillary: 200 mg/dL — ABNORMAL HIGH (ref 65–99)

## 2016-11-29 LAB — ECHOCARDIOGRAM COMPLETE
HEIGHTINCHES: 60 in
WEIGHTICAEL: 1868.8 [oz_av]

## 2016-11-29 LAB — CBC
HCT: 37.9 % (ref 36.0–46.0)
Hemoglobin: 11.3 g/dL — ABNORMAL LOW (ref 12.0–15.0)
MCH: 30.2 pg (ref 26.0–34.0)
MCHC: 29.8 g/dL — AB (ref 30.0–36.0)
MCV: 101.3 fL — AB (ref 78.0–100.0)
PLATELETS: 226 10*3/uL (ref 150–400)
RBC: 3.74 MIL/uL — ABNORMAL LOW (ref 3.87–5.11)
RDW: 13 % (ref 11.5–15.5)
WBC: 8.8 10*3/uL (ref 4.0–10.5)

## 2016-11-29 LAB — PROTIME-INR
INR: 1.48
Prothrombin Time: 18.1 seconds — ABNORMAL HIGH (ref 11.4–15.2)

## 2016-11-29 MED ORDER — WARFARIN SODIUM 3 MG PO TABS
6.0000 mg | ORAL_TABLET | Freq: Once | ORAL | Status: AC
  Administered 2016-11-29: 6 mg via ORAL
  Filled 2016-11-29: qty 2

## 2016-11-29 MED ORDER — ATORVASTATIN CALCIUM 20 MG PO TABS
20.0000 mg | ORAL_TABLET | Freq: Every evening | ORAL | Status: DC
  Administered 2016-11-29 – 2016-11-30 (×2): 20 mg via ORAL
  Filled 2016-11-29 (×2): qty 1

## 2016-11-29 MED ORDER — DOCUSATE SODIUM 100 MG PO CAPS: 100.0000 mg | ORAL_CAPSULE | Freq: Every day | ORAL | Status: DC | PRN

## 2016-11-29 MED ORDER — FUROSEMIDE 10 MG/ML IJ SOLN
40.0000 mg | Freq: Two times a day (BID) | INTRAMUSCULAR | Status: DC
  Administered 2016-11-29 (×2): 40 mg via INTRAVENOUS
  Filled 2016-11-29 (×3): qty 4

## 2016-11-29 MED ORDER — WARFARIN - PHARMACIST DOSING INPATIENT
Freq: Every day | Status: DC
  Administered 2016-11-30: 18:00:00

## 2016-11-29 MED ORDER — AZELASTINE HCL 0.1 % NA SOLN
1.0000 | Freq: Two times a day (BID) | NASAL | Status: DC
  Administered 2016-11-30 – 2016-12-01 (×2): 1 via NASAL
  Filled 2016-11-29: qty 30

## 2016-11-29 MED ORDER — INSULIN DETEMIR 100 UNIT/ML ~~LOC~~ SOLN
8.0000 [IU] | Freq: Every day | SUBCUTANEOUS | Status: DC
  Administered 2016-11-29 – 2016-11-30 (×3): 8 [IU] via SUBCUTANEOUS
  Filled 2016-11-29 (×3): qty 0.08

## 2016-11-29 MED ORDER — POLYETHYLENE GLYCOL 3350 17 G PO PACK
17.0000 g | PACK | Freq: Two times a day (BID) | ORAL | Status: DC
  Administered 2016-11-29 – 2016-12-01 (×2): 17 g via ORAL
  Filled 2016-11-29 (×4): qty 1

## 2016-11-29 MED ORDER — HEPARIN (PORCINE) IN NACL 100-0.45 UNIT/ML-% IJ SOLN
1050.0000 [IU]/h | INTRAMUSCULAR | Status: DC
  Administered 2016-11-29: 750 [IU]/h via INTRAVENOUS
  Administered 2016-11-29: 900 [IU]/h via INTRAVENOUS
  Administered 2016-12-01: 1050 [IU]/h via INTRAVENOUS
  Filled 2016-11-29 (×3): qty 250

## 2016-11-29 MED ORDER — CYCLOSPORINE 0.05 % OP EMUL
1.0000 [drp] | Freq: Two times a day (BID) | OPHTHALMIC | Status: DC
  Administered 2016-11-29 – 2016-12-01 (×4): 1 [drp] via OPHTHALMIC
  Filled 2016-11-29 (×5): qty 1

## 2016-11-29 MED ORDER — SODIUM CHLORIDE 0.9% FLUSH: 3.0000 mL | INTRAVENOUS | Status: DC | PRN

## 2016-11-29 MED ORDER — IPRATROPIUM-ALBUTEROL 0.5-2.5 (3) MG/3ML IN SOLN
3.0000 mL | Freq: Three times a day (TID) | RESPIRATORY_TRACT | Status: DC
  Administered 2016-11-29 – 2016-12-01 (×6): 3 mL via RESPIRATORY_TRACT
  Filled 2016-11-29 (×7): qty 3

## 2016-11-29 MED ORDER — AMIODARONE HCL 100 MG PO TABS
100.0000 mg | ORAL_TABLET | Freq: Every day | ORAL | Status: DC
  Administered 2016-11-29 – 2016-12-01 (×3): 100 mg via ORAL
  Filled 2016-11-29 (×3): qty 1

## 2016-11-29 MED ORDER — NIFEDIPINE ER OSMOTIC RELEASE 30 MG PO TB24
30.0000 mg | ORAL_TABLET | Freq: Every day | ORAL | Status: DC
  Administered 2016-11-29 – 2016-12-01 (×3): 30 mg via ORAL
  Filled 2016-11-29 (×3): qty 1

## 2016-11-29 MED ORDER — TRAMADOL HCL 50 MG PO TABS
50.0000 mg | ORAL_TABLET | Freq: Four times a day (QID) | ORAL | Status: DC | PRN
  Administered 2016-11-29: 50 mg via ORAL
  Filled 2016-11-29 (×2): qty 1

## 2016-11-29 MED ORDER — POTASSIUM CHLORIDE CRYS ER 10 MEQ PO TBCR
10.0000 meq | EXTENDED_RELEASE_TABLET | Freq: Once | ORAL | Status: AC
  Administered 2016-11-29: 10 meq via ORAL
  Filled 2016-11-29: qty 1

## 2016-11-29 MED ORDER — ACETAMINOPHEN 325 MG PO TABS
650.0000 mg | ORAL_TABLET | ORAL | Status: DC | PRN
  Administered 2016-11-29 (×2): 650 mg via ORAL
  Filled 2016-11-29 (×2): qty 2

## 2016-11-29 MED ORDER — ONDANSETRON HCL 4 MG/2ML IJ SOLN: 4.0000 mg | Freq: Four times a day (QID) | INTRAMUSCULAR | Status: DC | PRN

## 2016-11-29 MED ORDER — ASPIRIN EC 81 MG PO TBEC
81.0000 mg | DELAYED_RELEASE_TABLET | Freq: Every day | ORAL | Status: DC
  Administered 2016-11-29 – 2016-11-30 (×2): 81 mg via ORAL
  Filled 2016-11-29 (×2): qty 1

## 2016-11-29 MED ORDER — INSULIN ASPART 100 UNIT/ML ~~LOC~~ SOLN: 0.0000 [IU] | Freq: Every day | SUBCUTANEOUS | Status: DC

## 2016-11-29 MED ORDER — SODIUM CHLORIDE 0.9% FLUSH
3.0000 mL | Freq: Two times a day (BID) | INTRAVENOUS | Status: DC
  Administered 2016-11-29 – 2016-12-01 (×4): 3 mL via INTRAVENOUS

## 2016-11-29 MED ORDER — PANTOPRAZOLE SODIUM 40 MG PO TBEC
40.0000 mg | DELAYED_RELEASE_TABLET | Freq: Every day | ORAL | Status: DC
  Administered 2016-11-29 – 2016-12-01 (×3): 40 mg via ORAL
  Filled 2016-11-29 (×3): qty 1

## 2016-11-29 MED ORDER — SODIUM CHLORIDE 0.9 % IV SOLN: 250.0000 mL | INTRAVENOUS | Status: DC | PRN

## 2016-11-29 MED ORDER — MOMETASONE FURO-FORMOTEROL FUM 200-5 MCG/ACT IN AERO
2.0000 | INHALATION_SPRAY | Freq: Two times a day (BID) | RESPIRATORY_TRACT | Status: DC
  Administered 2016-11-29 – 2016-11-30 (×4): 2 via RESPIRATORY_TRACT
  Filled 2016-11-29: qty 8.8

## 2016-11-29 MED ORDER — INSULIN ASPART 100 UNIT/ML ~~LOC~~ SOLN
0.0000 [IU] | Freq: Three times a day (TID) | SUBCUTANEOUS | Status: DC
  Administered 2016-11-29 (×2): 3 [IU] via SUBCUTANEOUS
  Administered 2016-11-30: 5 [IU] via SUBCUTANEOUS
  Administered 2016-11-30: 2 [IU] via SUBCUTANEOUS

## 2016-11-29 MED ORDER — IPRATROPIUM-ALBUTEROL 0.5-2.5 (3) MG/3ML IN SOLN
3.0000 mL | Freq: Four times a day (QID) | RESPIRATORY_TRACT | Status: DC
  Administered 2016-11-29: 3 mL via RESPIRATORY_TRACT
  Filled 2016-11-29: qty 3

## 2016-11-29 NOTE — Progress Notes (Signed)
[facility] PROGRESS NOTE  [name] YPP:[id number] DOB: Dec 27, 1927 DOA: 11/28/2016  PCP: Chipper Herb, MD  Brief History/Interval Summary: 83 year old Caucasian female with a past medical history of coronary artery disease, paroxysmal atrial fibrillation on amiodarone and warfarin, history of COPD on home oxygen, chronic diastolic CHF, presented with progressively worsening shortness of breath and leg swelling. She also developed chest pain which was relieved with nitroglycerin. Patient was noted to be in acute CHF. She was hospitalized for further management.  Reason for Visit: Acute diastolic CHF.  Consultants: Cardiology  Procedures: None  Antibiotics: None  Subjective/Interval History: Patient states that she is feeling better this morning. Continues to have difficulty breathing. Cough with clear mucus. No chest pain this morning.  ROS: No nausea, vomiting  Objective:  Vital Signs  Vitals:   11/29/16 0037 11/29/16 0400 11/29/16 0724 11/29/16 0728  BP: (!) 149/91 (!) 138/56    Pulse: 73 76    Resp: 20 20    Temp: 97.5 F (36.4 C) 97.3 F (36.3 C)    TempSrc: Oral Oral    SpO2: 100% 100% 99% 99%  Weight: 53 kg (116 lb 12.8 oz)     Height: 5' (1.524 m)       Intake/Output Summary (Last 24 hours) at 11/29/16 1045 Last data filed at 11/29/16 1009  Gross per 24 hour  Intake           767.76 ml  Output              900 ml  Net          -132.24 ml   Filed Weights   11/28/16 1705 11/29/16 0031 11/29/16 0037  Weight: 54.2 kg (119 lb 6.4 oz) 53 kg (116 lb 12.8 oz) 53 kg (116 lb 12.8 oz)    General appearance: alert, cooperative, appears stated age and no distress Resp: Crackles heard bilaterally, about halfway up the lung fields. Slightly tachypneic. No wheezing. No rhonchi. Cardio: S1, S2 is normal, regular. Systolic murmur appreciated over the precordium. No S3, S4. GI: soft, non-tender; bowel sounds normal; no masses,  no organomegaly Extremities:  edema Minimal edema noted Both lower extremities Neurologic: No focal deficits  Lab Results:  Data Reviewed: I have personally reviewed following labs and imaging studies  CBC:  Recent Labs Lab 11/28/16 1653 11/29/16 0402  WBC 7.0 8.8  HGB 10.9* 11.3*  HCT 35.2* 37.9  MCV 99.7 101.3*  PLT 199 458    Basic Metabolic Panel:  Recent Labs Lab 11/28/16 1653 11/28/16 1918 11/29/16 0402  NA 137 138 143  K 5.9* 3.8 3.4*  CL 95* 94* 93*  CO2 33* 35* 39*  GLUCOSE 296* 238* 163*  BUN 33* 31* 26*  CREATININE 1.20* 1.22* 1.09*  CALCIUM 9.2 9.7 9.4    GFR: Estimated Creatinine Clearance: 26.6 mL/min (A) (by C-G formula based on SCr of 1.09 mg/dL (H)).  Coagulation Profile:  Recent Labs Lab 11/28/16 1918 11/29/16 0402  INR 1.37 1.48    Cardiac Enzymes:  Recent Labs Lab 11/28/16 2218 11/29/16 0402 11/29/16 0909  TROPONINI 0.07* 0.07* 0.06*    CBG:  Recent Labs Lab 11/29/16 0030 11/29/16 0257 11/29/16 0746  GLUCAP 231* 179* 97     Radiology Studies: Dg Chest 2 View  Result Date: 11/28/2016 CLINICAL DATA:  Chest pain and shortness of breath.  COPD.  Hypoxia. EXAM: CHEST  2 VIEW COMPARISON:  08/07/2016 FINDINGS: Is chronic cardiomegaly. There is new pulmonary vascular congestion with bilateral  pulmonary infiltrates, more prominent in the upper lobes, as well as bilateral small pleural effusions. There is tortuosity and calcification of the thoracic aorta. The lungs are hyperinflated with flattening of the diaphragm. IMPRESSION: Cardiomegaly with pulmonary vascular congestion and bilateral infiltrates, probably representing pulmonary edema. The pattern of upper lobe predominance is atypical. Is the patient febrile? Emphysema.  Small effusions. Electronically Signed   By: Lorriane Shire M.D.   On: 11/28/2016 17:43     Medications:  Scheduled: . amiodarone  100 mg Oral Daily  . aspirin EC  81 mg Oral Q breakfast  . atorvastatin  20 mg Oral QPM  .  cycloSPORINE  1 drop Both Eyes BID  . furosemide  40 mg Intravenous BID  . insulin aspart  0-15 Units Subcutaneous TID WC  . insulin aspart  0-5 Units Subcutaneous QHS  . insulin detemir  8 Units Subcutaneous Q2200  . ipratropium-albuterol  3 mL Nebulization TID  . mometasone-formoterol  2 puff Inhalation BID  . NIFEdipine  30 mg Oral Daily  . pantoprazole  40 mg Oral Daily  . polyethylene glycol  17 g Oral BID  . sodium chloride flush  3 mL Intravenous Q12H  . warfarin  6 mg Oral ONCE-1800  . Warfarin - Pharmacist Dosing Inpatient   Does not apply q1800   Continuous: . sodium chloride    . heparin 900 Units/hr (11/29/16 1028)   KPT:WSFKCL chloride, acetaminophen, docusate sodium, ondansetron (ZOFRAN) IV, sodium chloride flush, traMADol  Assessment/Plan:  Active Problems:   Hyperlipidemia LDL goal <70   PAF (paroxysmal atrial fibrillation) (HCC)   COPD with emphysema (HCC)   Chronic respiratory failure with hypoxia (HCC)   HTN (hypertension)   CAD S/P percutaneous coronary angioplasty   PVD (peripheral vascular disease), moderate carotid and LSCA disease   Falls frequently   DM type 2 causing CKD stage 3 (HCC)   Atherosclerotic peripheral vascular disease ([place])   CKD stage 3 secondary to diabetes (HCC)   CHF exacerbation (HCC)   Chest pain   Acute diastolic CHF (congestive heart failure) (HCC)   Elevated troponin    Chest pain with elevated troponin Patient's symptoms were worrisome for angina. However, her symptoms could've also been due to CHF. Troponin is mildly elevated. EKG shows T-wave changes in the inferior leads. Cardiology has been consulted.  Acute diastolic congestive heart failure Patient continues to have crackles both lungs. Continue IV Lasix. Monitor renal function closely. Strict ins and outs. Daily weights. Last echocardiogram was in February, which showed preserved ejection fraction, but grade 3 diastolic dysfunction was noted.  Chronic respiratory  failure with hypoxia. Likely due to COPD. She is on home oxygen.  History of chronic kidney disease stage III secondary to diabetes. Continue to monitor renal function closely while she is getting diuresed. Monitor urine output.  Paroxysmal atrial fibrillation. Chads2vascular score is 6. She is anticoagulated with warfarin. She was subtherapeutic at admission and was started on IV heparin. Pharmacy managing warfarin. In the past patient has not tolerated beta blocker due to bradycardia. Currently on amiodarone. She is sinus rhythm. Continue to monitor on telemetry.  Type 2 diabetes with chronic kidney disease. Sliding scale insulin coverage, to be continued. Monitor CBGs.  History of essential hypertension. Monitor blood pressures closely. Continue home medications.  Peripheral vascular disease. Stable. Continue aspirin. Continue statin.  DVT Prophylaxis: On IV heparin and warfarin    Code Status: Full code  Family Communication: Discussed with the patient  Disposition Plan: Management as  outlined above. Mobilize as tolerated. PT and OT evaluation.    LOS: 1 day   [facility] Pager [phone] 11/29/2016, 10:45 AM  If 7PM-7AM, please contact night-coverage at [email or web address], password [facility]

## 2016-11-29 NOTE — Progress Notes (Signed)
[place] for warfarin & heparin  Indication: atrial fibrillation  Allergies  Allergen Reactions  . Atorvastatin Other (See Comments)     muscle aches. Pt tolerating 20mg  at home  . Codeine Nausea And Vomiting  . Morphine Nausea And Vomiting  . Other Other (See Comments)    OPIATES cause nausea and vomiting  . Rosuvastatin Other (See Comments)    muscle aches at high doses, held as of 12/2010 due to aches  . Sulfa Antibiotics Nausea And Vomiting  . Iohexol Other (See Comments)     Desc: unknown reaction; allergic to iodine and contrast     Patient Measurements: Height: 5' (152.4 cm) Weight: 116 lb 12.8 oz (53 kg) IBW/kg (Calculated) : 45.5  Vital Signs: Temp: 98.4 F (36.9 C) (06/05 1210) Temp Source: Oral (06/05 1210) BP: 145/77 (06/05 1210) Pulse Rate: 72 (06/05 1210)  Labs:  Recent Labs  11/28/16 1653 11/28/16 1918 11/28/16 2218 11/29/16 0402 11/29/16 0909 11/29/16 1832  HGB 10.9*  --   --  11.3*  --   --   HCT 35.2*  --   --  37.9  --   --   PLT 199  --   --  226  --   --   LABPROT  --  17.0*  --  18.1*  --   --   INR  --  1.37  --  1.48  --   --   HEPARINUNFRC  --   --   --   --  0.19* 0.37  CREATININE 1.20* 1.22*  --  1.09*  --   --   TROPONINI  --   --  0.07* 0.07* 0.06*  --     Estimated Creatinine Clearance: 26.6 mL/min (A) (by C-G formula based on SCr of 1.09 mg/dL (H)).   Medical History: Past Medical History:  Diagnosis Date  . Anxiety   . Bradycardia    Torpol stopped  . CAD (coronary artery disease)    Cath in 2006 showed an occluded RCA with left-to-right collaterals & otherwise noncritical CAD with EF=25-30% at that time. EF subsequently improved to normal by 2D ECHO. Cath Aug. 2011 showed unchanged anatomy. > medical therapy recommended  . Carotid artery disease (HCC)    Moderate, right greater than left which we following by duplex ultrasound. [place] 12/05/11 = RIght Bulb/Prox ICA: Moderate to severe amt  of fibrous plaque elevating velocities w/in prox segment of ICA. Consistent w/a 50-69% diameter reduction. Left Bulb/Prox ICA: Moderate amt of fibrous plaque slightly elevating velocities w/in the prox segment of the ICA. Consistent w/a 0-49% diameter reduction.   . Chronic lower back pain   . COPD (chronic obstructive pulmonary disease) (HCC)    GOLD 4 - PFT 06/08/10 FEV1 0.93 (62%), FEV1% 60, TLC 2.84 (69%0, DLCO 54%, +BD) On home O2.  . Diastolic dysfunction    Grade II  . GERD (gastroesophageal reflux disease)   . Hyperlipidemia   . Hypertension   . Hypoxemic respiratory failure, chronic (HCC)    uses 2.5 liters of oxygen with sleep  . Lower extremity edema    ECHO 07/21/12 = EF 55-60%, performed for TIA. Responding to diurectics. Continue diuresis w/ a goal dry weight of <160lb. Venous Duplex 07/27/11 = Right lower extremity: no evidence of thrombus or thrombophlebitis. Essentially normal right lower extremity venous duplex Doppler evaluation.  . Myocardial infarction [facility])    "she's had several" (01/13/2015)  . On home oxygen therapy    "?  L; prn" (01/13/2015)  . PAF (paroxysmal atrial fibrillation) (HCC)    In the past, on coumadin  . RBBB (right bundle branch block)    Chronic  . Secondary pulmonary hypertension   . Sleep apnea   . Stroke [facility])    "/CT scan; ~ 2014; didn't even know she'd had it"  (01/13/2015)  . Type II diabetes mellitus (HCC)    goal A1C is 8, to avoid hypoglycemia    Assessment: 83 yo female admitted with SOB and chest pain concerning for worsening CHF. Pt on PTA warfarin for afib. Pharmacy consulted to dose heparin bridge to warfarin. INR on admission is low at 1.37, today up to 1.48. I confirmed Ms. Kolar's outpatient regimen with her and discussed her compliance with warfarin. She states that she has not missed any doses and that her INR usually fluctuates quite a bit. Heparin level was also subtherapeutic this morning.    Repeat heparin level is now  therapeutic at 0.37 on heparin 900 units/hr. No issues with infusion or bleeding noted.   PTA warfarin dose: 4mg  on Mon/Fri and 6mg  all other days    Goal of Therapy:  INR 2-3 Monitor platelets by anticoagulation protocol: Yes    Plan:  Continue heparin 900 units/hr Daily HL, CBC, INR Dc heparin when INR > 2  Andrey Cota. Diona Foley, PharmD, BCPS Clinical Pharmacist [phone] 11/29/2016 7:31 PM

## 2016-11-29 NOTE — Discharge Instructions (Signed)

## 2016-11-29 NOTE — Plan of Care (Signed)
Problem: Activity: Goal: Risk for activity intolerance will decrease Outcome: Not Progressing Pt. Continues to be SOB with ambulation and transfers

## 2016-11-29 NOTE — Evaluation (Signed)
Occupational Therapy Evaluation Patient Details Name: [name] MRN: [id number] DOB: Feb 02, 1928 Today's Date: 11/29/2016    History of Present Illness [name] is a 83 y.o. female with medical history significant of remote CAD, PAF on Amiodarone and Coumadin, COPD on home O2, and chronic diastolic failure admitted with worsening SOB, hypoxia on O2 and weakness and chest pain.   Clinical Impression   Pt admitted with above. She demonstrates the below listed deficits and will benefit from continued OT to maximize safety and independence with BADLs.  Pt requires min guard assist - supervision with ADLs.  There is discrepancy between pt's and daughter's report of pt functional status at home.  Pt and dtr seem to have  A very contentious relationship.  Pt insists she has adequate supervision/assist from neighbor.   Recommend HHOT to address IADLs and ensure she is able to function safely in her home environment.  Will follow acutely.       Follow Up Recommendations  Home health OT;Supervision/Assistance - 24 hour;Supervision - Intermittent    Equipment Recommendations  None recommended by OT    Recommendations for Other Services       Precautions / Restrictions Precautions Precautions: Fall Precaution Comments: oxygen dependent      Mobility Bed Mobility Overal bed mobility: Modified Independent             General bed mobility comments: pt up in recliner  Transfers Overall transfer level: Needs assistance Equipment used: Rolling walker (2 wheeled) Transfers: Sit to/from [facility] Sit to Stand: Supervision Stand pivot transfers: Supervision       General transfer comment: increased time, cues for technique, heavy UE support    Balance Overall balance assessment: Needs assistance Sitting-balance support: Feet supported Sitting balance-Leahy Scale: Good     Standing balance support: During functional activity;No upper extremity  supported Standing balance-Leahy Scale: Fair Standing balance comment: UE support needed, LOB during period of unsupport pulling up depends after toileting                           ADL either performed or assessed with clinical judgement   ADL Overall ADL's : Needs assistance/impaired Eating/Feeding: Independent   Grooming: Wash/dry hands;Wash/dry face;Oral care;Brushing hair;Supervision/safety;Standing   Upper Body Bathing: Set up;Sitting   Lower Body Bathing: Min guard;Supervison/ safety;Sit to/from stand Lower Body Bathing Details (indicate cue type and reason): simulated shower in standing  Upper Body Dressing : Set up;Sitting   Lower Body Dressing: Supervision/safety;Sit to/from stand   Toilet Transfer: Supervision/safety;Ambulation;Comfort height toilet;RW   Toileting- Clothing Manipulation and Hygiene: Supervision/safety;Sit to/from stand       Functional mobility during ADLs: Supervision/safety;Min guard;Rolling walker General ADL Comments: Pt requires occasional cues for walker safety      Vision Baseline Vision/History: Legally blind Patient Visual Report: No change from baseline Additional Comments: Pt demonstrates difficulty locating  and identifying items on lunch tray      Perception     Praxis      Pertinent Vitals/Pain Pain Assessment: Faces Pain Score: 5  Faces Pain Scale: Hurts little more Pain Location: neck Pain Descriptors / Indicators: Grimacing Pain Intervention(s): Monitored during session     Hand Dominance Right   Extremity/Trunk Assessment Upper Extremity Assessment Upper Extremity Assessment: Overall WFL for tasks assessed   Lower Extremity Assessment Lower Extremity Assessment: Defer to PT evaluation   Cervical / Trunk Assessment Cervical / Trunk Assessment: Kyphotic   Communication Communication Communication: [facility]  Cognition Arousal/Alertness: Awake/alert Behavior During Therapy: WFL for tasks  assessed/performed Overall Cognitive Status: Within Functional Limits for tasks assessed                                 General Comments: Pt oriented x 4.   [name] for cognition as info she provides has been consistent between disciplines and during session.  Dtr indicates that pt is not reliable, however, pt inisists that daughter is not familiar with her current situation as daughter does not visit and does not know her current level of assist, and available resources.  Pt and daughter appear to have very contentious relationship.     General Comments  DOE 2/4 on 4L supplemental 02    Exercises     Shoulder Instructions      Home Living Family/patient expects to be discharged to:: Private residence Living Arrangements: Alone Available Help at Discharge: Available PRN/intermittently;Personal care attendant Type of Home: Independent living facility Home Access: [place]: One level     Bathroom Shower/Tub: Teacher, early years/pre: [place]: Environmental consultant - 4 wheels;Cane - single point;Shower seat;Grab bars - tub/shower   Additional Comments: aide at least 8 hours a day 7 days a week.  Daughter disputes this, but pt is insistent she has this level of support and reports dtr has not visited since she discharged last time       Prior Functioning/Environment Level of Independence: Independent with assistive device(s);Needs assistance  Gait / Transfers Assistance Needed: pt reported that she mostly ambulates with use of rollator ADL's / Homemaking Assistance Needed: Pt reports caregiver assists wtih cleaning.  She reports she cooks and performs the cleaning tasks she enjoys.  She reports she showers in standing, but sits to wash hair and feet, and is mod I with dressing, and grocery shopping.  She does not drive.    Comments: pt reported that she mostly ambulates with use of rollator        OT Problem List: Decreased  strength;Decreased activity tolerance;Impaired balance (sitting and/or standing);Decreased cognition;Decreased safety awareness;Decreased knowledge of use of DME or AE;Cardiopulmonary status limiting activity;Pain      OT Treatment/Interventions: Self-care/ADL training;Therapeutic exercise;Energy conservation;DME and/or AE instruction;Therapeutic activities;Cognitive remediation/compensation;Patient/family education;Balance training    OT Goals(Current goals can be found in the care plan section) Acute Rehab OT Goals Patient Stated Goal: to go home  OT Goal Formulation: With patient Time For Goal Achievement: 12/06/16 Potential to Achieve Goals: Good ADL Goals Pt Will Perform Grooming: with modified independence;standing Pt Will Perform Upper Body Bathing: with modified independence;sitting;standing Pt Will Perform Lower Body Bathing: with modified independence;sit to/from stand Pt Will Perform Upper Body Dressing: with modified independence;sitting Pt Will Perform Lower Body Dressing: with modified independence;sit to/from stand Pt Will Transfer to Toilet: with modified independence;ambulating;regular height toilet;grab bars Pt Will Perform Toileting - Clothing Manipulation and hygiene: with modified independence;sit to/from stand Pt Will Perform Tub/Shower Transfer: Tub transfer;with supervision;ambulating;shower seat;rolling walker;grab bars  OT Frequency: Min 2X/week   Barriers to D/C: Decreased caregiver support          Co-evaluation              AM-PAC PT "6 Clicks" Daily Activity     Outcome Measure Help from another person eating meals?: None Help from another person taking care of personal grooming?: A Little Help from  another person toileting, which includes using toliet, bedpan, or urinal?: A Little Help from another person bathing (including washing, rinsing, drying)?: A Little Help from another person to put on and taking off regular upper body clothing?: A  Little Help from another person to put on and taking off regular lower body clothing?: A Little 6 Click Score: 19   End of Session Equipment Utilized During Treatment: Rolling walker;Oxygen Nurse Communication: Mobility status  Activity Tolerance: Patient tolerated treatment well Patient left: in bed;with call bell/phone within reach;with bed alarm set  OT Visit Diagnosis: Unsteadiness on feet (R26.81)                Time: 5809-9833 OT Time Calculation (min): 42 min Charges:  OT General Charges $OT Visit: 1 Procedure OT Evaluation $OT Eval Moderate Complexity: 1 Procedure OT Treatments $Self Care/Home Management : 23-37 mins G-Codes:     [facility], OTR/L [phone]   Kentravious Lipford M 11/29/2016, 1:24 PM

## 2016-11-29 NOTE — Consult Note (Signed)
CARDIOLOGY CONSULT NOTE  Patient ID: [name] MRN: [id number] DOB/AGE: May 15, 1928 83 y.o.  Admit date: 11/28/2016 Primary Physician   Chipper Herb, MD Primary Cardiologist Dr. Percival Spanish Chief Complaint  Dyspnea Requesting  Dr. Maryland Pink  HPI:  Asked by Dr. Maryland Pink to see with patient with dyspnea and history of cardiomyopathy with previous reduced EF and CAD (Cath 01/25/2010 revealing a total right coronary artery, 50% circumflex, 60% and we and 2 with an ejection fraction of 50-55%. Previously she had angioplasty in 1992 with a right coronary artery. She had faint antegrade and more prominent retrograde collaterals from the left coronary system.)  She has had atrial fib.  Most recent EF earlier this year was preserved at 55%.  She has echo evidence of diastolic dysfunction.  She also has COPD and home O2.   She last was seen in our office in March.   She was last admitted in Feb with acute on chronic diastolic HF.  Her discharge weight at that time was about 117 lbs.    She weighs herself daily.  She her weight typically does not fluctuate.  If it does she takes some extra diuretic.  She gets intermittent chest pain particularly when she has increased SOB.  She was having increased dyspnea over the base 36 - 48 hours with some mild chest discomfort.   She did not feel like she needed NTG.  She has had no change in her diet or salt intake.  She watches her fluid.  She presented the the ED.   BNP has been elevated.  Troponin has been mildly elevated but the trend has been flat.   CXR demonstrated increased fluid.     Past Medical History:  Diagnosis Date  . Anxiety   . Bradycardia    Torpol stopped  . CAD (coronary artery disease)    Cath in 2006 showed an occluded RCA with left-to-right collaterals & otherwise noncritical CAD with EF=25-30% at that time. EF subsequently improved to normal by 2D ECHO. Cath Aug. 2011 showed unchanged anatomy. > medical therapy recommended  . Carotid  artery disease (HCC)    Moderate, right greater than left which we following by duplex ultrasound. [place] 12/05/11 = RIght Bulb/Prox ICA: Moderate to severe amt of fibrous plaque elevating velocities w/in prox segment of ICA. Consistent w/a 50-69% diameter reduction. Left Bulb/Prox ICA: Moderate amt of fibrous plaque slightly elevating velocities w/in the prox segment of the ICA. Consistent w/a 0-49% diameter reduction.   . Chronic lower back pain   . COPD (chronic obstructive pulmonary disease) (HCC)    GOLD 4 - PFT 06/08/10 FEV1 0.93 (62%), FEV1% 60, TLC 2.84 (69%0, DLCO 54%, +BD) On home O2.  . Diastolic CHF [facility])    hospitalized last Feb 2018  . Diastolic dysfunction    Grade II  . Fall during current hospitalization    "was at [facility]; fell; transferred to [facility]" (01/13/2015)  . GERD (gastroesophageal reflux disease)   . Hyperlipidemia   . Hypertension   . Hypoxemic respiratory failure, chronic (HCC)    uses 2.5 liters of oxygen with sleep  . Lower extremity edema    ECHO 07/21/12 = EF 55-60%, performed for TIA. Responding to diurectics. Continue diuresis w/ a goal dry weight of <160lb. Venous Duplex 07/27/11 = Right lower extremity: no evidence of thrombus or thrombophlebitis. Essentially normal right lower extremity venous duplex Doppler evaluation.  . Myocardial infarction [facility])    "she's had several" (01/13/2015)  . On home oxygen  therapy    "?L; prn" (01/13/2015)  . PAF (paroxysmal atrial fibrillation) (HCC)    In the past, on coumadin  . RBBB (right bundle branch block)    Chronic  . Secondary pulmonary hypertension   . Sleep apnea   . Stroke [facility])    "/CT scan; ~ 2014; didn't even know she'd had it"  (01/13/2015)  . Type II diabetes mellitus (HCC)    goal A1C is 8, to avoid hypoglycemia    Past Surgical History:  Procedure Laterality Date  . CARDIAC CATHETERIZATION  2006 & 2011   Cath in 2006 showed an occluded RCA with left-to-right collaterals & otherwise noncritical CAD with  EF=25-30% at that time. EF subsequently improved to normal by 2D ECHO. Cath Aug. 2011 showed unchanged anatomy. > medical therapy recommended.  . Carotid Duplex  12/05/11   Moderate, right greater than left which we following by duplex ultrasound. [place] 12/05/11 = RIght Bulb/Prox ICA: Moderate to severe amt of fibrous plaque elevating velocities w/in prox segment of ICA. Consistent w/a 50-69% diameter reduction. Left Bulb/Prox ICA: Moderate amt of fibrous plaque slightly elevating velocities w/in the prox segment of the ICA. Consistent w/a 0-49% diameter reduction.   Marland Kitchen CATARACT EXTRACTION W/ INTRAOCULAR LENS  IMPLANT, BILATERAL Bilateral   . CORONARY ANGIOPLASTY    . CORONARY ANGIOPLASTY WITH STENT PLACEMENT     x2  . FEMUR IM NAIL Left 01/15/2015   Procedure: INTRAMEDULLARY (IM) NAIL FEMORAL LEFT ;  Surgeon: Netta Cedars, MD;  Location: [place];  Service: Orthopedics;  Laterality: Left;  . SHOULDER OPEN ROTATOR CUFF REPAIR Right 07/2005  . TONSILLECTOMY    . TOTAL ABDOMINAL HYSTERECTOMY  ~ 1967  . TRANSVAGINAL TAPE (TVT) REMOVAL    . Venous Duplex  07/27/11   Venous Duplex 07/27/11 = Right lower extremity: no evidence of thrombus or thrombophlebitis. Essentially normal right lower extremity venous duplex Doppler evaluation.    Allergies  Allergen Reactions  . Atorvastatin Other (See Comments)     muscle aches. Pt tolerating 20mg  at home  . Codeine Nausea And Vomiting  . Morphine Nausea And Vomiting  . Other Other (See Comments)    OPIATES cause nausea and vomiting  . Rosuvastatin Other (See Comments)    muscle aches at high doses, held as of 12/2010 due to aches  . Sulfa Antibiotics Nausea And Vomiting  . Iohexol Other (See Comments)     Desc: unknown reaction; allergic to iodine and contrast    Prescriptions Prior to Admission  Medication Sig Dispense Refill Last Dose  . ACCU-CHEK AVIVA PLUS test strip CHECK BLOOD SUGER UP TO 3 TIMES A DAY 100 each 2 11/28/2016 at Unknown time  . acetaminophen  (TYLENOL) 500 MG tablet Take 500 mg by mouth every 4 (four) hours as needed for mild pain.    11/27/2016 at Unknown time  . amiodarone (PACERONE) 200 MG tablet Take 0.5 tablets (100 mg total) by mouth daily. 15 tablet 6 11/28/2016 at Unknown time  . aspirin EC 81 MG tablet Take 81 mg by mouth daily with breakfast.    11/28/2016 at Unknown time  . atorvastatin (LIPITOR) 20 MG tablet TAKE 1 TABLET DAILY IN THE EVENING 90 tablet 0 11/27/2016 at Unknown time  . Azelastine HCl 0.15 % SOLN Place 1 spray into both nostrils 2 (two) times daily as needed (congestion).   11/28/2016 at Unknown time  . baclofen (LIORESAL) 10 MG tablet TAKE 1/2 TABLET EVERY 8 HOURS AS NEEDED FOR MUSCLE SPASM (Patient  taking differently: TAKE 5MG  TABLET EVERY 8 HOURS AS NEEDED FOR MUSCLE SPASM) 45 tablet 0 Past Month at Unknown time  . budesonide-formoterol (SYMBICORT) 160-4.5 MCG/ACT inhaler Inhale 1 puff into the lungs 2 (two) times daily. (Patient taking differently: Inhale 1 puff into the lungs 2 (two) times daily as needed (wheezing). ) 3 Inhaler 2 11/28/2016 at Unknown time  . Cholecalciferol (VITAMIN D3) 2000 units TABS Take 2,000 Units by mouth daily.   11/28/2016 at Unknown time  . docusate sodium (COLACE) 100 MG capsule Take 100 mg by mouth daily as needed for moderate constipation.    Past Week at Unknown time  . ferrous sulfate 325 (65 FE) MG tablet TAKE 1 TABLET ONCE DAILY WITH BREAKFAST 100 tablet 4 11/28/2016 at Unknown time  . fluticasone (FLONASE) 50 MCG/ACT nasal spray Place 1 spray into both nostrils 2 (two) times daily. (Patient taking differently: Place 1 spray into both nostrils as needed. ) 16 g 6 11/28/2016 at Unknown time  . GuaiFENesin (CHEST CONGESTION RELIEF PO) Take 1-1.5 tablets by mouth as needed.    11/28/2016 at Unknown time  . hydrocortisone (ANUSOL-HC) 25 MG suppository Place 1 suppository (25 mg total) rectally 2 (two) times daily as needed for hemorrhoids or itching. 12 suppository 0 11/27/2016 at Unknown time  .  Insulin Detemir (LEVEMIR FLEXTOUCH) 100 UNIT/ML Pen INJECT 8 units once a day each morning 15 mL 6 11/28/2016 at Unknown time  . Insulin Pen Needle (PEN NEEDLES) 31G X 6 MM MISC USE AS DIRECTED UP TO 4 TIMES A DAY 200 each 1 11/28/2016 at Unknown time  . Ipratropium-Albuterol (COMBIVENT) 20-100 MCG/ACT AERS respimat Inhale 1 puff into the lungs every 6 (six) hours as needed for wheezing or shortness of breath. 3 Inhaler 2 11/28/2016 at Unknown time  . Multiple Vitamins-Minerals (CENTRUM ADULTS) TABS Take 1 tablet by mouth daily with breakfast.   11/28/2016 at Unknown time  . neomycin-bacitracin-polymyxin (NEOSPORIN) [phone] ointment Apply 1 application topically as needed (ulcer on the legs).   11/28/2016 at Unknown time  . NIFEdipine (PROCARDIA-XL/ADALAT-CC/NIFEDICAL-XL) 30 MG 24 hr tablet Take 1 tablet (30 mg total) by mouth daily. 30 tablet 6 11/28/2016 at Unknown time  . ondansetron (ZOFRAN ODT) 4 MG disintegrating tablet Take 1 tablet (4 mg total) by mouth every 8 (eight) hours as needed for nausea or vomiting. 12 tablet 3 11/28/2016 at Unknown time  . OXYGEN Inhale 3-5 L into the lungs daily.    11/28/2016 at Unknown time  . pantoprazole (PROTONIX) 40 MG tablet TAKE 1 TABLET DAILY (Patient taking differently: TAKE 40 MG TABLET DAILY) 60 tablet 1 11/28/2016 at Unknown time  . polyethylene glycol (MIRALAX / GLYCOLAX) packet Take 17 g by mouth 2 (two) times daily as needed for moderate constipation. 14 each 0 11/27/2016 at Unknown time  . potassium chloride SA (K-DUR,KLOR-CON) 20 MEQ tablet TAKE 1 TABLET DAILY 30 tablet 5 11/28/2016 at Unknown time  . PRODIGY TWIST TOP LANCETS 28G MISC CHECK BLOOD SUGAR UP TO 4 TIMES A DAY 200 each 2 11/28/2016 at Unknown time  . RESTASIS MULTIDOSE 0.05 % ophthalmic emulsion    11/28/2016 at Unknown time  . simethicone (MYLICON) 80 MG chewable tablet Chew 80 mg by mouth at bedtime as needed for flatulence.   11/28/2016 at Unknown time  . torsemide (DEMADEX) 20 MG tablet 2 tab po BID, one  week take 3 tab in AM and 2 tab in PM 150 tablet 1 11/28/2016 at Unknown time  . vitamin C (  ASCORBIC ACID) 500 MG tablet Take 500 mg by mouth at bedtime.    11/28/2016 at Unknown time  . warfarin (COUMADIN) 4 MG tablet Take 1 tab Mon and Fri. 1.5 tablets all other days. 45 tablet 0 11/28/2016 at 10:00  . nitroGLYCERIN (NITROSTAT) 0.4 MG SL tablet Place 1 tablet (0.4 mg total) under the tongue every 5 (five) minutes as needed for chest pain. 25 tablet 0  at PRN  . traMADol (ULTRAM) 50 MG tablet Take 1 tablet (50 mg total) by mouth every 6 (six) hours as needed for severe pain. 15 tablet 0  at PRN   Family History  Problem Relation Age of Onset  . Cancer Mother        Skin  . Heart disease Mother   . Emphysema Father   . Heart disease Father   . Congestive Heart Failure Father   . Heart disease Brother   . Heart disease Brother   . Early death Sister     Social History   Social History  . Marital status: Widowed    Spouse name: N/A  . Number of children: N/A  . Years of education: N/A   Occupational History  . Not on file.   Social History Main Topics  . Smoking status: Former Smoker    Packs/day: 1.50    Years: 15.00    Types: Cigarettes  . Smokeless tobacco: Never Used     Comment: Smoked 1.5 packs per day for 15 years, quit in 1995.  Marland Kitchen Alcohol use 3.6 oz/week    6 Glasses of wine per week     Comment: 01/13/2015 "couple glasses of wine maybe 3 days/wk"  . Drug use: No  . Sexual activity: No   Other Topics Concern  . Not on file   Social History Narrative   Widowed, lives with daughter     ROS:    Very hard of hearing.  Otherwise, as stated in the HPI and negative for all other systems.  Physical Exam: Blood pressure (!) 138/56, pulse 76, temperature 97.3 F (36.3 C), temperature source Oral, resp. rate 20, height 5' (1.524 m), weight 116 lb 12.8 oz (53 kg), SpO2 99 %.  GENERAL:  Well appearing HEENT:  Pupils equal round and reactive, fundi not visualized, oral mucosa  unremarkable NECK:  No jugular venous distention, waveform within normal limits, carotid upstroke brisk and symmetric, no bruits, no thyromegaly LYMPHATICS:  No cervical, inguinal adenopathy LUNGS:  Clear to auscultation bilaterally BACK:  No CVA tenderness CHEST:  Unremarkable HEART:  PMI not displaced or sustained,S1 and S2 within normal limits, no S3, no S4, no clicks, no rubs, no murmurs ABD:  Flat, positive bowel sounds normal in frequency in pitch, no bruits, no rebound, no guarding, no midline pulsatile mass, no hepatomegaly, no splenomegaly EXT:  2 plus pulses throughout, no edema, no cyanosis no clubbing SKIN:  No rashes no nodules NEURO:  Cranial nerves II through XII grossly intact, motor grossly intact throughout PSYCH:  Cognitively intact, oriented to person place and time   Labs: Lab Results  Component Value Date   BUN 26 (H) 11/29/2016   Lab Results  Component Value Date   CREATININE 1.09 (H) 11/29/2016   Lab Results  Component Value Date   NA 143 11/29/2016   K 3.4 (L) 11/29/2016   CL 93 (L) 11/29/2016   CO2 39 (H) 11/29/2016   Lab Results  Component Value Date   TROPONINI 0.07 ([place]) 11/29/2016   Lab  Results  Component Value Date   WBC 8.8 11/29/2016   HGB 11.3 (L) 11/29/2016   HCT 37.9 11/29/2016   MCV 101.3 (H) 11/29/2016   PLT 226 11/29/2016   Lab Results  Component Value Date   CHOL 170 07/20/2016   HDL 86 07/20/2016   LDLCALC 71 07/20/2016   TRIG 66 07/20/2016   CHOLHDL 2.0 07/20/2016   Lab Results  Component Value Date   ALT 43 (H) 08/17/2016   AST 37 08/17/2016   ALKPHOS 84 08/17/2016   BILITOT 0.4 08/17/2016     Radiology:   CXR: Is chronic cardiomegaly. There is new pulmonary vascular congestion with bilateral pulmonary infiltrates, more prominent in the upper lobes, as well as bilateral small pleural effusions. There is tortuosity and calcification of the thoracic aorta. The lungs are hyperinflated with flattening of the  diaphragm.  EKG:  NSR, RBBB, no acute ST T wave changes.  No change from previous  11/28/16  ASSESSMENT AND PLAN:   ACUTE ON CHRONIC DIASTOLIC HF:    She symptomatically is improved and probably near baseline although the I/O are not showing significant output.   Weight is stable.  I think for now that gentle diuresis is indicated with continued IV Lasix for now.    CAD:   Troponin is mildly elevated.  However, I think that this is likely demand ischemia.  She has a stable chest pain pattern and I would not suggest invasive work up.    CKD III:  Baseline creat appears to be 1.2.    Follow BMET.   BRADYCARDIA:  In the past beta blockers have been held because of this.  She has also had hypotension necessitating holding Procardia.  She is tolerating a low dose of nifedipine now.  Avoid beta blocker.   CAROTID STENOSIS:   She has had bilateral 50 - 69% stenosis a couple of years ago and will need follow up of this.  I will arrange as an outpatient.    HTN:   BP is mildly elevated.  However, this has been low in the past so I will not titrate meds at this point.    PAF:  In NSR, continue warfarin.     SignedMinus Breeding 11/29/2016, 9:28 AM

## 2016-11-29 NOTE — Clinical Social Work Note (Signed)
Per nurse secretary, patient's daughter wanted CSW to call her to discuss SNF placement instead of HHPT. CSW met with patient to discuss first. Patient has no interest in SNF and states that she has plenty of help at home between two of her neighbors. Patient reports living at [facility] with her dog. Patient stated that CSW does not need to call her daughter back.  CSW signing off. Consult again if any social work needs arise.  Dayton Scrape, [place]

## 2016-11-29 NOTE — Progress Notes (Signed)
Tensed for warfarin & heparin  Indication: atrial fibrillation  Allergies  Allergen Reactions  . Atorvastatin Other (See Comments)     muscle aches. Pt tolerating 20mg  at home  . Codeine Nausea And Vomiting  . Morphine Nausea And Vomiting  . Other Other (See Comments)    OPIATES cause nausea and vomiting  . Rosuvastatin Other (See Comments)    muscle aches at high doses, held as of 12/2010 due to aches  . Sulfa Antibiotics Nausea And Vomiting  . Iohexol Other (See Comments)     Desc: unknown reaction; allergic to iodine and contrast     Patient Measurements: Height: 5' (152.4 cm) Weight: 116 lb 12.8 oz (53 kg) IBW/kg (Calculated) : 45.5  Vital Signs: Temp: 97.3 F (36.3 C) (06/05 0400) Temp Source: Oral (06/05 0400) BP: 138/56 (06/05 0400) Pulse Rate: 76 (06/05 0400)  Labs:  Recent Labs  11/28/16 1653 11/28/16 1918 11/28/16 2218 11/29/16 0402 11/29/16 0909  HGB 10.9*  --   --  11.3*  --   HCT 35.2*  --   --  37.9  --   PLT 199  --   --  226  --   LABPROT  --  17.0*  --  18.1*  --   INR  --  1.37  --  1.48  --   HEPARINUNFRC  --   --   --   --  0.19*  CREATININE 1.20* 1.22*  --  1.09*  --   TROPONINI  --   --  0.07* 0.07*  --     Estimated Creatinine Clearance: 26.6 mL/min (A) (by C-G formula based on SCr of 1.09 mg/dL (H)).   Medical History: Past Medical History:  Diagnosis Date  . Anxiety   . Bradycardia    Torpol stopped  . CAD (coronary artery disease)    Cath in 2006 showed an occluded RCA with left-to-right collaterals & otherwise noncritical CAD with EF=25-30% at that time. EF subsequently improved to normal by 2D ECHO. Cath Aug. 2011 showed unchanged anatomy. > medical therapy recommended  . Carotid artery disease (HCC)    Moderate, right greater than left which we following by duplex ultrasound. [place] 12/05/11 = RIght Bulb/Prox ICA: Moderate to severe amt of fibrous plaque elevating velocities w/in prox  segment of ICA. Consistent w/a 50-69% diameter reduction. Left Bulb/Prox ICA: Moderate amt of fibrous plaque slightly elevating velocities w/in the prox segment of the ICA. Consistent w/a 0-49% diameter reduction.   . Chronic lower back pain   . COPD (chronic obstructive pulmonary disease) (HCC)    GOLD 4 - PFT 06/08/10 FEV1 0.93 (62%), FEV1% 60, TLC 2.84 (69%0, DLCO 54%, +BD) On home O2.  . Diastolic dysfunction    Grade II  . GERD (gastroesophageal reflux disease)   . Hyperlipidemia   . Hypertension   . Hypoxemic respiratory failure, chronic (HCC)    uses 2.5 liters of oxygen with sleep  . Lower extremity edema    ECHO 07/21/12 = EF 55-60%, performed for TIA. Responding to diurectics. Continue diuresis w/ a goal dry weight of <160lb. Venous Duplex 07/27/11 = Right lower extremity: no evidence of thrombus or thrombophlebitis. Essentially normal right lower extremity venous duplex Doppler evaluation.  . Myocardial infarction [facility])    "she's had several" (01/13/2015)  . On home oxygen therapy    "?L; prn" (01/13/2015)  . PAF (paroxysmal atrial fibrillation) (HCC)    In the past, on coumadin  . RBBB (  right bundle branch block)    Chronic  . Secondary pulmonary hypertension   . Sleep apnea   . Stroke [facility])    "/CT scan; ~ 2014; didn't even know she'd had it"  (01/13/2015)  . Type II diabetes mellitus (HCC)    goal A1C is 8, to avoid hypoglycemia    Assessment: 83 yo female admitted with SOB and chest pain concerning for worsening CHF. Pt on PTA warfarin for afib. Pharmacy consulted to dose heparin bridge to warfarin. INR on admission is low at 1.37, today up to 1.48. I confirmed [name]'s outpatient regimen with her and discussed her compliance with warfarin. She states that she has not missed any doses and that her INR usually fluctuates quite a bit. Heparin level was also subtherapeutic this morning.    PTA warfarin dose: 4mg  on Mon/Fri and 6mg  all other days    Goal of Therapy:  INR  2-3 Monitor platelets by anticoagulation protocol: Yes    Plan:  -Increase heparin to 900 units/hr -Daily HL, CBC, INR -Warfarin 6 mg po x1 -Dc heparin when INR > 2 -Check HL this afternoon    Hughes Better, PharmD, BCPS Clinical Pharmacist 11/29/2016 10:19 AM

## 2016-11-29 NOTE — Progress Notes (Signed)
Inpatient Diabetes Program Recommendations  AACE/ADA: New Consensus Statement on Inpatient Glycemic Control (2015)  Target Ranges:  Prepandial:   less than 140 mg/dL      Peak postprandial:   less than 180 mg/dL (1-2 hours)      Critically ill patients:  140 - 180 mg/dL   Lab Results  Component Value Date   GLUCAP 97 11/29/2016   HGBA1C 7.2 (H) 04/24/2016    Inpatient Diabetes Program Recommendations:  Please consider A1c to determine prehospital glycemic control.  Thank you, Nani Gasser. Jla Reynolds, RN, MSN, CDE  Diabetes Coordinator Inpatient Glycemic Control Team Team Pager [phone] (8am-5pm) 11/29/2016 9:51 AM

## 2016-11-29 NOTE — Evaluation (Signed)
Physical Therapy Evaluation Patient Details Name: [name] MRN: [id number] DOB: Aug 03, 1927 Today's Date: 11/29/2016   History of Present Illness  [name] is a 83 y.o. female with medical history significant of remote CAD, PAF on Amiodarone and Coumadin, COPD on home O2, and chronic diastolic failure admitted with worsening SOB, hypoxia on O2 and weakness and chest pain.  Clinical Impression  Patient presents with decreased independence with mobility due to deficits listed in PT problem list.  She will benefit from skilled PT in the acute setting to allow return home to ILF with assist of aide daily and follow up HHPT.     Follow Up Recommendations Home health PT;Supervision for mobility/OOB    Equipment Recommendations  None recommended by PT    Recommendations for Other Services       Precautions / Restrictions Precautions Precautions: Fall Precaution Comments: oxygen dependent Restrictions Weight Bearing Restrictions: No      Mobility  Bed Mobility               General bed mobility comments: pt up in recliner  Transfers Overall transfer level: Needs assistance   Transfers: Sit to/from Stand Sit to Stand: Supervision         General transfer comment: increased time, cues for technique, heavy UE support  Ambulation/Gait Ambulation/Gait assistance: Min guard;Supervision Ambulation Distance (Feet): 160 Feet Assistive device: Rolling walker (2 wheeled) Gait Pattern/deviations: Step-through pattern;Decreased stride length;Trunk flexed     General Gait Details: stopped to stand twice in order to wash hands and check SpO2; was unable to read with ambulation on 4L O2, seated after walk reading 91%, HR 71  Stairs            Wheelchair Mobility    Modified Rankin (Stroke Patients Only)       Balance Overall balance assessment: Needs assistance Sitting-balance support: Feet supported Sitting balance-Leahy Scale: Good     Standing balance  support: Bilateral upper extremity supported Standing balance-Leahy Scale: Poor Standing balance comment: UE support needed, LOB during period of unsupport pulling up depends after toileting                             Pertinent Vitals/Pain Pain Assessment: 0-10 Pain Score: 5  Pain Location: neck Pain Descriptors / Indicators: Aching;Discomfort Pain Intervention(s): Monitored during session;Repositioned;Heat applied;Patient requesting pain meds-RN notified    Home Living Family/patient expects to be discharged to:: Private residence Living Arrangements: Alone Available Help at Discharge: Available PRN/intermittently;Personal care attendant Type of Home: Independent living facility       Home Layout: One level Home Equipment: Double Spring - 4 wheels;Cane - single point;Shower seat Additional Comments: aide at least 8 hours a day 7 days a week    Prior Function Level of Independence: Independent with assistive device(s)         Comments: pt reported that she mostly ambulates with use of rollator     Hand Dominance   Dominant Hand: Right    Extremity/Trunk Assessment   Upper Extremity Assessment Upper Extremity Assessment: Defer to OT evaluation    Lower Extremity Assessment Lower Extremity Assessment: Generalized weakness    Cervical / Trunk Assessment Cervical / Trunk Assessment: Kyphotic  Communication   Communication: HOH  Cognition Arousal/Alertness: Awake/alert Behavior During Therapy: WFL for tasks assessed/performed Overall Cognitive Status: Within Functional Limits for tasks assessed  General Comments      Exercises     Assessment/Plan    PT Assessment Patient needs continued PT services  PT Problem List Decreased strength;Decreased activity tolerance;Decreased balance;Decreased mobility;Cardiopulmonary status limiting activity;Decreased knowledge of use of DME       PT Treatment  Interventions DME instruction;Gait training;Therapeutic exercise;Patient/family education;Therapeutic activities;Functional mobility training;Balance training    PT Goals (Current goals can be found in the Care Plan section)  Acute Rehab PT Goals Patient Stated Goal: To return to independent living PT Goal Formulation: With patient Time For Goal Achievement: 12/06/16 Potential to Achieve Goals: Good    Frequency Min 3X/week   Barriers to discharge        Co-evaluation               AM-PAC PT "6 Clicks" Daily Activity  Outcome Measure Difficulty turning over in bed (including adjusting bedclothes, sheets and blankets)?: A Little Difficulty moving from lying on back to sitting on the side of the bed? : Total Difficulty sitting down on and standing up from a chair with arms (e.g., wheelchair, bedside commode, etc,.)?: Total Help needed moving to and from a bed to chair (including a wheelchair)?: A Little Help needed walking in hospital room?: A Little Help needed climbing 3-5 steps with a railing? : Total 6 Click Score: 12    End of Session Equipment Utilized During Treatment: Gait belt;Oxygen Activity Tolerance: Patient tolerated treatment well Patient left: in chair;with call bell/phone within reach;with chair alarm set   PT Visit Diagnosis: Muscle weakness (generalized) (M62.81);Difficulty in walking, not elsewhere classified (R26.2)    Time: 0917-1001 PT Time Calculation (min) (ACUTE ONLY): 44 min   Charges:   PT Evaluation $PT Eval High Complexity: 1 Procedure PT Treatments $Gait Training: 8-22 mins $Therapeutic Activity: 8-22 mins   PT G Codes:   PT G-Codes **NOT FOR INPATIENT CLASS** Functional Assessment Tool Used: AM-PAC 6 Clicks Basic Mobility Functional Limitation: Mobility: Walking and moving around Mobility: Walking and Moving Around Current Status (T0354): At least 60 percent but less than 80 percent impaired, limited or restricted Mobility: Walking  and Moving Around Goal Status [phone]): At least 40 percent but less than 60 percent impaired, limited or restricted    [place], [place] 11/29/2016   [name] 11/29/2016, 10:25 AM

## 2016-11-29 NOTE — Consult Note (Signed)
   [facility] Inpatient Consult   11/29/2016  [name] [birth year] [id number]   Patient was assessed for hospitalization with [place]. Patient admitted with HF exacerbation with a HX of AF COPD.  Met with the daughter, Santiago Glad,  who states the patient had been worsening over the past week with her 02 sats in the 52's.  As we were speaking Cheri from [facility] came by to confirm that she is active in their home based palliative care program.  Came by to explain that [facility] care management would be a duplicate and patient confirms being active with [facility].   [facility] Care management will not follow.   Updated inpatient RNCM, Hassan Rowan on findings.  Daughter came by back to voice her concerns of her mother living alone and convincing people she has 24 hour caregivers in which the daughter states is not true.  Natividad Brood, RN BSN [facility] Liaison  [phone] business mobile phone Toll free office [phone]

## 2016-11-29 NOTE — Care Management Note (Signed)
Case Management Note  Patient Details  Name: [name] MRN: [id number] Date of Birth: 02-09-1928  Subjective/Objective:      Admitted with PVD             Action/Plan: Patient lives alone; PCP: Chipper Herb, MD; has private insurance with [facility] with prescription drug coverage; CM talked to patient about DCP, she is upset with her daughter stated " she is trying to run my life and tell me what to do" Patient is refusing all [place] services, states that she has a friend / neighbor that takes care of her, does her cooking and errands. DME - walker, cane and home oxygen; CM will continue to follow for DCP.  Expected Discharge Date:     Possibly 12/01/2016             Expected Discharge Plan:  [place]  In-House Referral:   [facility]  Discharge planning Services  CM Consult  Post Acute Care Choice:    Choice offered to:  Patient  HH Arranged:  Patient Refused HH  Status of Service:  In process, will continue to follow  Sherrilyn Rist [phone] 11/29/2016, 3:26 PM

## 2016-11-29 NOTE — Progress Notes (Signed)
ANTICOAGULATION CONSULT NOTE - Initial Consult  Pharmacy Consult for warfarin & heparin  Indication: atrial fibrillation  Allergies  Allergen Reactions  . Atorvastatin Other (See Comments)     muscle aches  . Codeine Nausea And Vomiting  . Morphine Nausea And Vomiting  . Other Other (See Comments)    OPIATES cause nausea and vomiting  . Rosuvastatin Other (See Comments)    muscle aches at high doses, held as of 12/2010 due to aches  . Sulfa Antibiotics Nausea And Vomiting  . Iohexol Other (See Comments)     Desc: unknown reaction; allergic to iodine and contrast     Patient Measurements: Height: 5' (152.4 cm) Weight: 119 lb 6.4 oz (54.2 kg) IBW/kg (Calculated) : 45.5  Vital Signs: Temp: 97.8 F (36.6 C) (06/04 1905) Temp Source: Oral (06/04 1905) BP: 151/70 (06/04 2315) Pulse Rate: 72 (06/04 2315)  Labs:  Recent Labs  11/28/16 1653 11/28/16 1918 11/28/16 2218  HGB 10.9*  --   --   HCT 35.2*  --   --   PLT 199  --   --   LABPROT  --  17.0*  --   INR  --  1.37  --   CREATININE 1.20* 1.22*  --   TROPONINI  --   --  0.07*    Estimated Creatinine Clearance: 23.8 mL/min (A) (by C-G formula based on SCr of 1.22 mg/dL (H)).   Medical History: Past Medical History:  Diagnosis Date  . Anxiety   . Bradycardia    Torpol stopped  . CAD (coronary artery disease)    Cath in 2006 showed an occluded RCA with left-to-right collaterals & otherwise noncritical CAD with EF=25-30% at that time. EF subsequently improved to normal by 2D ECHO. Cath Aug. 2011 showed unchanged anatomy. > medical therapy recommended  . Carotid artery disease (HCC)    Moderate, right greater than left which we following by duplex ultrasound. [place] 12/05/11 = RIght Bulb/Prox ICA: Moderate to severe amt of fibrous plaque elevating velocities w/in prox segment of ICA. Consistent w/a 50-69% diameter reduction. Left Bulb/Prox ICA: Moderate amt of fibrous plaque slightly elevating velocities w/in the prox  segment of the ICA. Consistent w/a 0-49% diameter reduction.   . Chronic lower back pain   . COPD (chronic obstructive pulmonary disease) (HCC)    GOLD 4 - PFT 06/08/10 FEV1 0.93 (62%), FEV1% 60, TLC 2.84 (69%0, DLCO 54%, +BD) On home O2.  . Diastolic CHF [facility])    hospitalized last Feb 2018  . Diastolic dysfunction    Grade II  . Fall during current hospitalization    "was at [facility]; fell; transferred to [facility]" (01/13/2015)  . GERD (gastroesophageal reflux disease)   . Hyperlipidemia   . Hypertension   . Hypoxemic respiratory failure, chronic (HCC)    uses 2.5 liters of oxygen with sleep  . Lower extremity edema    ECHO 07/21/12 = EF 55-60%, performed for TIA. Responding to diurectics. Continue diuresis w/ a goal dry weight of <160lb. Venous Duplex 07/27/11 = Right lower extremity: no evidence of thrombus or thrombophlebitis. Essentially normal right lower extremity venous duplex Doppler evaluation.  . Myocardial infarction [facility])    "she's had several" (01/13/2015)  . On home oxygen therapy    "?L; prn" (01/13/2015)  . PAF (paroxysmal atrial fibrillation) (HCC)    In the past, on coumadin  . RBBB (right bundle branch block)    Chronic  . Secondary pulmonary hypertension   . Sleep apnea   .  Stroke [facility])    "/CT scan; ~ 2014; didn't even know she'd had it"  (01/13/2015)  . Type II diabetes mellitus (HCC)    goal A1C is 8, to avoid hypoglycemia    Assessment: 83 yo female admitted with SOB and chest pain concerning for worsening CHF. Pt on PTA warfarin for afib. Pharmacy consulted to dose heparin bridge to warfarin. INR 1.37 on admission. Of note, patient takes warfarin doses in morning, with last at 10AM on 6/4. Her last [facility] clinic visit was on 5/16 with a therapeutic INR of 2.2 (home draw). CBC is low-stable and no s/s bleeding noted.   Per provider, initiate heparin gtt without bolus and bridge until INR >2.   PTA warfarin dose: 4mg  on Mon/Fri and 6mg  all other days   Goal of  Therapy:  INR 2-3 Monitor platelets by anticoagulation protocol: Yes   Plan:  Start heparin at 750 units/hr  No warfarin tonight  Heparin level in 8 hours  Daily HL, INR, and CBC Monitor for s/s bleeding   Argie Ramming, PharmD Pharmacy Resident  Pager [phone] 11/29/16 12:13 AM

## 2016-11-29 NOTE — Progress Notes (Signed)
  Echocardiogram 2D Echocardiogram has been performed.  Ricco Dershem L Androw 11/29/2016, 1:18 PM

## 2016-11-30 DIAGNOSIS — R296 Repeated falls: Secondary | ICD-10-CM

## 2016-11-30 LAB — CBC
HCT: 34.4 % — ABNORMAL LOW (ref 36.0–46.0)
Hemoglobin: 10.4 g/dL — ABNORMAL LOW (ref 12.0–15.0)
MCH: 30.1 pg (ref 26.0–34.0)
MCHC: 30.2 g/dL (ref 30.0–36.0)
MCV: 99.7 fL (ref 78.0–100.0)
PLATELETS: 207 10*3/uL (ref 150–400)
RBC: 3.45 MIL/uL — AB (ref 3.87–5.11)
RDW: 13 % (ref 11.5–15.5)
WBC: 7.4 10*3/uL (ref 4.0–10.5)

## 2016-11-30 LAB — BASIC METABOLIC PANEL
Anion gap: 14 (ref 5–15)
BUN: 31 mg/dL — ABNORMAL HIGH (ref 6–20)
CALCIUM: 9.1 mg/dL (ref 8.9–10.3)
CHLORIDE: 92 mmol/L — AB (ref 101–111)
CO2: 31 mmol/L (ref 22–32)
CREATININE: 1.5 mg/dL — AB (ref 0.44–1.00)
GFR calc non Af Amer: 30 mL/min — ABNORMAL LOW (ref >60)
GFR, EST AFRICAN AMERICAN: 35 mL/min — AB (ref >60)
Glucose, Bld: 223 mg/dL — ABNORMAL HIGH (ref 65–99)
Potassium: 4 mmol/L (ref 3.5–5.1)
SODIUM: 137 mmol/L (ref 135–145)

## 2016-11-30 LAB — GLUCOSE, CAPILLARY
GLUCOSE-CAPILLARY: 260 mg/dL — AB (ref 65–99)
Glucose-Capillary: 208 mg/dL — ABNORMAL HIGH (ref 65–99)
Glucose-Capillary: 136 mg/dL — ABNORMAL HIGH (ref 65–99)
Glucose-Capillary: 161 mg/dL — ABNORMAL HIGH (ref 65–99)

## 2016-11-30 LAB — PROTIME-INR
INR: 1.65
PROTHROMBIN TIME: 19.7 s — AB (ref 11.4–15.2)

## 2016-11-30 LAB — HEPARIN LEVEL (UNFRACTIONATED): HEPARIN UNFRACTIONATED: 0.19 [IU]/mL — AB (ref 0.30–0.70)

## 2016-11-30 MED ORDER — WARFARIN SODIUM 3 MG PO TABS
6.0000 mg | ORAL_TABLET | Freq: Once | ORAL | Status: AC
  Administered 2016-11-30: 6 mg via ORAL
  Filled 2016-11-30: qty 2

## 2016-11-30 MED ORDER — INSULIN ASPART 100 UNIT/ML ~~LOC~~ SOLN: 0.0000 [IU] | Freq: Three times a day (TID) | SUBCUTANEOUS | Status: DC

## 2016-11-30 MED ORDER — TORSEMIDE 20 MG PO TABS
20.0000 mg | ORAL_TABLET | Freq: Two times a day (BID) | ORAL | Status: DC
  Administered 2016-11-30 – 2016-12-01 (×2): 20 mg via ORAL
  Filled 2016-11-30 (×2): qty 1

## 2016-11-30 MED ORDER — QUETIAPINE FUMARATE 25 MG PO TABS: 25.0000 mg | ORAL_TABLET | Freq: Every day | ORAL | Status: DC

## 2016-11-30 MED ORDER — TRAZODONE HCL 50 MG PO TABS: 25.0000 mg | ORAL_TABLET | Freq: Every day | ORAL | Status: DC

## 2016-11-30 MED ORDER — HALOPERIDOL LACTATE 5 MG/ML IJ SOLN
1.0000 mg | Freq: Once | INTRAMUSCULAR | Status: AC
  Administered 2016-11-30: 1 mg via INTRAMUSCULAR
  Filled 2016-11-30: qty 1

## 2016-11-30 MED ORDER — ASPIRIN EC 81 MG PO TBEC
81.0000 mg | DELAYED_RELEASE_TABLET | Freq: Every day | ORAL | Status: DC
  Administered 2016-12-01: 81 mg via ORAL
  Filled 2016-11-30: qty 1

## 2016-11-30 NOTE — Progress Notes (Signed)
Page to Dr Hartford Poli   3e16 pt is combative and confused, pts family anxious at bedside essentially demanding pt be knocked out. had haldol IM at 0400.

## 2016-11-30 NOTE — Progress Notes (Signed)
Physical Therapy Treatment Patient Details Name: [name] MRN: [id number] DOB: 07-01-1927 Today's Date: 11/30/2016    History of Present Illness [name] is a 83 y.o. female with medical history significant of remote CAD, PAF on Amiodarone and Coumadin, COPD on home O2, and chronic diastolic failure admitted with worsening SOB, hypoxia on O2 and weakness and chest pain.    PT Comments    Patient tolerated increased gait distance with 3L O2 via [place]. SpO2 desat to 87% with ambulation but quickly up to 91% with rest and pursed lip breathing. Continue to progress as tolerated with anticipated d/c home with HHPT.    Follow Up Recommendations  Home health PT;Supervision for mobility/OOB     Equipment Recommendations  None recommended by PT    Recommendations for Other Services       Precautions / Restrictions Precautions Precautions: Fall Precaution Comments: oxygen dependent    Mobility  Bed Mobility               General bed mobility comments: pt sitting EOB upon arrival  Transfers Overall transfer level: Needs assistance Equipment used: 4-wheeled walker Transfers: Sit to/from Stand Sit to Stand: Supervision         General transfer comment: cues for safe hand placement   Ambulation/Gait Ambulation/Gait assistance: Supervision Ambulation Distance (Feet): 200 Feet Assistive device: 4-wheeled walker Gait Pattern/deviations: Step-through pattern;Decreased stride length;Trunk flexed     General Gait Details: cues for cadence and pursed lip breathing at times; pt tolerated horizontal head turns, directional changes, changes in gait speed, and turning without significant gait deviations with use of AD; SpO2 desat to 87% on 3L O2 via [place] with ambulation but quickly up to 91% with pursed lip breathing   Stairs            Wheelchair Mobility    Modified Rankin (Stroke Patients Only)       Balance Overall balance assessment: Needs  assistance Sitting-balance support: Feet supported Sitting balance-Leahy Scale: Good       Standing balance-Leahy Scale: Fair                              Cognition Arousal/Alertness: Awake/alert Behavior During Therapy: WFL for tasks assessed/performed Overall Cognitive Status: Within Functional Limits for tasks assessed                                        Exercises General Exercises - Lower Extremity Long Arc Quad: AROM;Both;20 reps;Seated Hip Flexion/Marching: AROM;Both;15 reps;Seated    General Comments        Pertinent Vitals/Pain Pain Assessment: Faces Faces Pain Scale: Hurts little more Pain Location: "all over" Pain Descriptors / Indicators: Aching;Discomfort Pain Intervention(s): Limited activity within patient's tolerance;Monitored during session    Home Living                      Prior Function            PT Goals (current goals can now be found in the care plan section) Progress towards PT goals: Progressing toward goals    Frequency    Min 3X/week      PT Plan Current plan remains appropriate    Co-evaluation              AM-PAC PT "6 Clicks" Daily Activity  Outcome Measure  Difficulty turning over in bed (including adjusting bedclothes, sheets and blankets)?: A Little Difficulty moving from lying on back to sitting on the side of the bed? : A Little Difficulty sitting down on and standing up from a chair with arms (e.g., wheelchair, bedside commode, etc,.)?: Total Help needed moving to and from a bed to chair (including a wheelchair)?: A Little Help needed walking in hospital room?: A Little Help needed climbing 3-5 steps with a railing? : A Little 6 Click Score: 16    End of Session Equipment Utilized During Treatment: Gait belt;Oxygen Activity Tolerance: Patient tolerated treatment well Patient left: in bed;with call bell/phone within reach Nurse Communication: Mobility status PT Visit  Diagnosis: Muscle weakness (generalized) (M62.81);Difficulty in walking, not elsewhere classified (R26.2)     Time: 5638-9373 PT Time Calculation (min) (ACUTE ONLY): 26 min  Charges:  $Gait Training: 8-22 mins $Therapeutic Exercise: 8-22 mins                    G Codes:       Earney Navy, PTA Pager: [phone]     Darliss Cheney 11/30/2016, 3:35 PM

## 2016-11-30 NOTE — Progress Notes (Signed)
Daughter in law at the bedside trying to calm down the pt.
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
## 2016-11-30 NOTE — Progress Notes (Signed)
[place] for warfarin & heparin  Indication: atrial fibrillation  Allergies  Allergen Reactions  . Atorvastatin Other (See Comments)     muscle aches. Pt tolerating 20mg  at home  . Codeine Nausea And Vomiting  . Morphine Nausea And Vomiting  . Other Other (See Comments)    OPIATES cause nausea and vomiting  . Rosuvastatin Other (See Comments)    muscle aches at high doses, held as of 12/2010 due to aches  . Sulfa Antibiotics Nausea And Vomiting  . Iohexol Other (See Comments)     Desc: unknown reaction; allergic to iodine and contrast     Patient Measurements: Height: 5' (152.4 cm) Weight: 119 lb 11.2 oz (54.3 kg) IBW/kg (Calculated) : 45.5  Vital Signs: Temp: 97.6 F (36.4 C) (06/06 1202) Temp Source: Oral (06/06 1202) BP: 96/61 (06/06 1202) Pulse Rate: 75 (06/06 1202)  Labs:  Recent Labs  11/28/16 1653 11/28/16 1918 11/28/16 2218 11/29/16 0402 11/29/16 0909 11/29/16 1832 11/30/16 0517 11/30/16 1359  HGB 10.9*  --   --  11.3*  --   --  10.4*  --   HCT 35.2*  --   --  37.9  --   --  34.4*  --   PLT 199  --   --  226  --   --  207  --   LABPROT  --  17.0*  --  18.1*  --   --  19.7*  --   INR  --  1.37  --  1.48  --   --  1.65  --   HEPARINUNFRC  --   --   --   --  0.19* 0.37  --  0.19*  CREATININE 1.20* 1.22*  --  1.09*  --   --  1.50*  --   TROPONINI  --   --  0.07* 0.07* 0.06*  --   --   --     Estimated Creatinine Clearance: 19.3 mL/min (A) (by C-G formula based on SCr of 1.5 mg/dL (H)).     Assessment: 83 yo female admitted with SOB and chest pain concerning for worsening CHF. Pt on PTA warfarin for afib. Pharmacy consulted to dose heparin bridge to warfarin.   Heparin level this PM = 0.19, INR trending up  PTA warfarin dose: 4mg  on Mon/Fri and 6mg  all other days    Goal of Therapy:  INR 2-3 Monitor platelets by anticoagulation protocol: Yes    Plan:  Increase heparin to 1050 units / hr Repeat Warfarin  6 mg po x 1 Daily HL, CBC, INR Dc heparin when INR > 2  Thank you Anette Guarneri, PharmD [phone] 11/30/2016 3:58 PM

## 2016-11-30 NOTE — Progress Notes (Signed)
Progress Note  Patient Name: [name] Date of Encounter: 11/30/2016  Primary Cardiologist:   Dr. Percival Spanish  Subjective   She was confused and combative this morning.  She is alert and appropriate now.  She ambulated.   Inpatient Medications    Scheduled Meds: . amiodarone  100 mg Oral Daily  . aspirin EC  81 mg Oral Q breakfast  . atorvastatin  20 mg Oral QPM  . azelastine  1 spray Each Nare BID  . cycloSPORINE  1 drop Both Eyes BID  . furosemide  40 mg Intravenous BID  . insulin aspart  0-15 Units Subcutaneous TID WC  . insulin aspart  0-5 Units Subcutaneous QHS  . insulin detemir  8 Units Subcutaneous Q2200  . ipratropium-albuterol  3 mL Nebulization TID  . mometasone-formoterol  2 puff Inhalation BID  . NIFEdipine  30 mg Oral Daily  . pantoprazole  40 mg Oral Daily  . polyethylene glycol  17 g Oral BID  . sodium chloride flush  3 mL Intravenous Q12H  . warfarin  6 mg Oral ONCE-1800  . Warfarin - Pharmacist Dosing Inpatient   Does not apply q1800   Continuous Infusions: . sodium chloride    . heparin 900 Units/hr (11/30/16 0645)   PRN Meds: sodium chloride, acetaminophen, docusate sodium, ondansetron (ZOFRAN) IV, sodium chloride flush, traMADol   Vital Signs    Vitals:   11/29/16 2005 11/29/16 2056 11/30/16 0552 11/30/16 0956  BP: 138/63     Pulse: 72     Resp: 17     Temp: 97.5 F (36.4 C)     TempSrc: Oral     SpO2: 92% 98%  94%  Weight:   119 lb 11.2 oz (54.3 kg)   Height:        Intake/Output Summary (Last 24 hours) at 11/30/16 1042 Last data filed at 11/30/16 0928  Gross per 24 hour  Intake           777.65 ml  Output              800 ml  Net           -22.35 ml   Filed Weights   11/29/16 0031 11/29/16 0037 11/30/16 0552  Weight: 116 lb 12.8 oz (53 kg) 116 lb 12.8 oz (53 kg) 119 lb 11.2 oz (54.3 kg)    Telemetry    NSR, vent ectopy - Personally Reviewed  ECG    NA - Personally Reviewed  Physical Exam   GEN: No acute distress.     Neck: No  JVD Cardiac: RRR, no murmurs, rubs, or gallops.  Respiratory: Clear  to auscultation bilaterally. GI: Soft, nontender, non-distended  MS: Mild  edema; No deformity. Neuro:  Nonfocal  Psych: Normal affect   Labs    Chemistry Recent Labs Lab 11/28/16 1918 11/29/16 0402 11/30/16 0517  NA 138 143 137  K 3.8 3.4* 4.0  CL 94* 93* 92*  CO2 35* 39* 31  GLUCOSE 238* 163* 223*  BUN 31* 26* 31*  CREATININE 1.22* 1.09* 1.50*  CALCIUM 9.7 9.4 9.1  GFRNONAA 39* 45* 30*  GFRAA 45* 52* 35*  ANIONGAP 9 11 14      Hematology Recent Labs Lab 11/28/16 1653 11/29/16 0402 11/30/16 0517  WBC 7.0 8.8 7.4  RBC 3.53* 3.74* 3.45*  HGB 10.9* 11.3* 10.4*  HCT 35.2* 37.9 34.4*  MCV 99.7 101.3* 99.7  MCH 30.9 30.2 30.1  MCHC 31.0 29.8* 30.2  RDW 13.0 13.0  13.0  PLT 199 226 207    Cardiac Enzymes Recent Labs Lab 11/28/16 2218 11/29/16 0402 11/29/16 0909  TROPONINI 0.07* 0.07* 0.06*    Recent Labs Lab 11/28/16 1731  TROPIPOC 0.05     BNP Recent Labs Lab 11/28/16 2218  BNP 1,388.3*     DDimer No results for input(s): DDIMER in the last 168 hours.   Radiology    Dg Chest 2 View  Result Date: 11/28/2016 CLINICAL DATA:  Chest pain and shortness of breath.  COPD.  Hypoxia. EXAM: CHEST  2 VIEW COMPARISON:  08/07/2016 FINDINGS: Is chronic cardiomegaly. There is new pulmonary vascular congestion with bilateral pulmonary infiltrates, more prominent in the upper lobes, as well as bilateral small pleural effusions. There is tortuosity and calcification of the thoracic aorta. The lungs are hyperinflated with flattening of the diaphragm. IMPRESSION: Cardiomegaly with pulmonary vascular congestion and bilateral infiltrates, probably representing pulmonary edema. The pattern of upper lobe predominance is atypical. Is the patient febrile? Emphysema.  Small effusions. Electronically Signed   By: Lorriane Shire M.D.   On: 11/28/2016 17:43    Cardiac Studies   ECHO:   - Left  ventricle: The cavity size was normal. There was moderate   concentric hypertrophy. Systolic function was mildly reduced. The   estimated ejection fraction was in the range of 45% to 50%.   Diffuse hypokinesis. - Ventricular septum: The contour showed diastolic flattening. - Aortic valve: Trileaflet; mildly thickened, moderately calcified   leaflets. Valve area (VTI): 1.76 cm^2. Valve area (Vmax): 1.7   cm^2. Valve area (Vmean): 1.77 cm^2. - Mitral valve: Calcified annulus. Mild prolapse, involving the   posterior leaflet. There was moderate regurgitation. - Left atrium: The atrium was mildly dilated. - Pulmonary arteries: Systolic pressure was mildly increased. PA   peak pressure: 38 mm Hg (S).  Patient Profile     83 y.o. female with patient with dyspnea and history of cardiomyopathy with previous reduced EF and CAD.  We are asked to see secondary to CHF.    Assessment & Plan    ACUTE ON CHRONIC CHF:  She does have a mildy reduced EF.  This is new compared with previous.  However, I personally reviewed and compared the echo from 2/18 and yesterday and I do not think that there is a significant difference in the EF.  Her creat is starting to increase and I will change back to her previous PO dose of diuretic.    CAD:   Flat trend in the enzymes.  I do not suspect an acute coronary syndrome and no invasive evaluation is planned.    CKD III:  Creat elevated as above.     Signed, Minus Breeding, MD  11/30/2016, 10:42 AM

## 2016-12-01 DIAGNOSIS — R748 Abnormal levels of other serum enzymes: Secondary | ICD-10-CM

## 2016-12-01 DIAGNOSIS — I2510 Atherosclerotic heart disease of native coronary artery without angina pectoris: Secondary | ICD-10-CM

## 2016-12-01 DIAGNOSIS — J438 Other emphysema: Secondary | ICD-10-CM

## 2016-12-01 DIAGNOSIS — E1122 Type 2 diabetes mellitus with diabetic chronic kidney disease: Secondary | ICD-10-CM

## 2016-12-01 DIAGNOSIS — N183 Chronic kidney disease, stage 3 (moderate): Secondary | ICD-10-CM

## 2016-12-01 DIAGNOSIS — Z794 Long term (current) use of insulin: Secondary | ICD-10-CM

## 2016-12-01 DIAGNOSIS — Z9861 Coronary angioplasty status: Secondary | ICD-10-CM

## 2016-12-01 DIAGNOSIS — I70209 Unspecified atherosclerosis of native arteries of extremities, unspecified extremity: Secondary | ICD-10-CM

## 2016-12-01 DIAGNOSIS — I480 Paroxysmal atrial fibrillation: Secondary | ICD-10-CM

## 2016-12-01 LAB — BASIC METABOLIC PANEL
Anion gap: 12 (ref 5–15)
BUN: 35 mg/dL — AB (ref 6–20)
CO2: 33 mmol/L — ABNORMAL HIGH (ref 22–32)
Calcium: 9.2 mg/dL (ref 8.9–10.3)
Chloride: 95 mmol/L — ABNORMAL LOW (ref 101–111)
Creatinine, Ser: 1.52 mg/dL — ABNORMAL HIGH (ref 0.44–1.00)
GFR, EST AFRICAN AMERICAN: 35 mL/min — AB (ref >60)
GFR, EST NON AFRICAN AMERICAN: 30 mL/min — AB (ref >60)
Glucose, Bld: 129 mg/dL — ABNORMAL HIGH (ref 65–99)
POTASSIUM: 4 mmol/L (ref 3.5–5.1)
SODIUM: 140 mmol/L (ref 135–145)

## 2016-12-01 LAB — CBC
HCT: 34.3 % — ABNORMAL LOW (ref 36.0–46.0)
Hemoglobin: 10.3 g/dL — ABNORMAL LOW (ref 12.0–15.0)
MCH: 30.3 pg (ref 26.0–34.0)
MCHC: 30 g/dL (ref 30.0–36.0)
MCV: 100.9 fL — ABNORMAL HIGH (ref 78.0–100.0)
Platelets: 244 10*3/uL (ref 150–400)
RBC: 3.4 MIL/uL — ABNORMAL LOW (ref 3.87–5.11)
RDW: 13.4 % (ref 11.5–15.5)
WBC: 7.5 10*3/uL (ref 4.0–10.5)

## 2016-12-01 LAB — GLUCOSE, CAPILLARY
GLUCOSE-CAPILLARY: 96 mg/dL (ref 65–99)
GLUCOSE-CAPILLARY: 162 mg/dL — AB (ref 65–99)

## 2016-12-01 LAB — BRAIN NATRIURETIC PEPTIDE: B Natriuretic Peptide: 468.6 pg/mL — ABNORMAL HIGH (ref 0.0–100.0)

## 2016-12-01 LAB — PROTIME-INR
INR: 2
PROTHROMBIN TIME: 23 s — AB (ref 11.4–15.2)

## 2016-12-01 MED ORDER — WARFARIN SODIUM 2 MG PO TABS: 4.0000 mg | ORAL_TABLET | Freq: Once | ORAL | Status: DC

## 2016-12-01 MED ORDER — SALINE SPRAY 0.65 % NA SOLN: 1.0000 | NASAL | 0 refills | Status: AC | PRN

## 2016-12-01 MED ORDER — SALINE SPRAY 0.65 % NA SOLN
1.0000 | NASAL | Status: DC | PRN
  Filled 2016-12-01: qty 44

## 2016-12-01 NOTE — Progress Notes (Signed)
Occupational Therapy Treatment Patient Details Name: [name] MRN: [id number] DOB: 12/01/1927 Today's Date: 12/01/2016    History of present illness [name] is a 83 y.o. female with medical history significant of remote CAD, PAF on Amiodarone and Coumadin, COPD on home O2, and chronic diastolic failure admitted with worsening SOB, hypoxia on O2 and weakness and chest pain.   OT comments  Pt required supervision for functional mobility this session with SpO2 in high 80s to low 90s throughout session on 4L supplemental O2. Educated pt on pursed lip breathing and energy conservation strategies; she verbalized understanding. Continue to recommend [facility] for follow up. Will continue to follow acutely.    Follow Up Recommendations  Home health OT;Supervision/Assistance - 24 hour;Supervision - Intermittent    Equipment Recommendations  None recommended by OT    Recommendations for Other Services      Precautions / Restrictions Precautions Precautions: Fall Precaution Comments: oxygen dependent Restrictions Weight Bearing Restrictions: No       Mobility Bed Mobility               General bed mobility comments: Pt sitting EOB upon arrival  Transfers Overall transfer level: Needs assistance Equipment used: Rolling walker (2 wheeled) Transfers: Sit to/from Stand Sit to Stand: Supervision         General transfer comment: increased time, good hand placement and technique    Balance Overall balance assessment: Needs assistance Sitting-balance support: Feet supported;No upper extremity supported Sitting balance-Leahy Scale: Good     Standing balance support: Bilateral upper extremity supported Standing balance-Leahy Scale: Fair                             ADL either performed or assessed with clinical judgement   ADL Overall ADL's : Needs assistance/impaired                 Upper Body Dressing : Set up;Sitting Upper Body Dressing Details  (indicate cue type and reason): to doff/don gown     Toilet Transfer: Supervision/safety;Ambulation;RW           Functional mobility during ADLs: Supervision/safety;Rolling walker General ADL Comments: SpO2 high 80-low 90s on 4L supplemental O2. Received breathing treatment prior to session. Educated pt on pursed lip breathing and energy conservations strategies for home.     Vision       Perception     Praxis      Cognition Arousal/Alertness: Awake/alert Behavior During Therapy: WFL for tasks assessed/performed Overall Cognitive Status: Within Functional Limits for tasks assessed                                          Exercises     Shoulder Instructions       General Comments      Pertinent Vitals/ Pain       Pain Assessment: Faces Faces Pain Scale: Hurts little more Pain Location: back Pain Descriptors / Indicators: Discomfort;Sore Pain Intervention(s): Monitored during session;Limited activity within patient's tolerance  Home Living                                          Prior Functioning/Environment              Frequency  Min 2X/week        Progress Toward Goals  OT Goals(current goals can now be found in the care plan section)  Progress towards OT goals: Progressing toward goals  Acute Rehab OT Goals Patient Stated Goal: to go home  OT Goal Formulation: With patient  Plan Discharge plan remains appropriate    Co-evaluation                 AM-PAC PT "6 Clicks" Daily Activity     Outcome Measure   Help from another person eating meals?: None Help from another person taking care of personal grooming?: A Little Help from another person toileting, which includes using toliet, bedpan, or urinal?: A Little Help from another person bathing (including washing, rinsing, drying)?: A Little Help from another person to put on and taking off regular upper body clothing?: A Little Help from another  person to put on and taking off regular lower body clothing?: A Little 6 Click Score: 19    End of Session Equipment Utilized During Treatment: Rolling walker;Oxygen  OT Visit Diagnosis: Unsteadiness on feet (R26.81)   Activity Tolerance Patient tolerated treatment well   Patient Left with call bell/phone within reach (sitting EOB)   Nurse Communication          Time: 0981-1914 OT Time Calculation (min): 23 min  Charges: OT General Charges $OT Visit: 1 Procedure OT Treatments $Self Care/Home Management : 23-37 mins  Kenedy Haisley A. Ulice Brilliant, M.S., OTR/L Pager: [place] 12/01/2016, 10:18 AM
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
## 2016-12-01 NOTE — Consult Note (Signed)
   [facility] CM Inpatient Consult   12/01/2016  [name] Mar 18, 1928 [id number]  This writer received a call from [facility] who states that this patient is actually not active with [facility].  Met with the patient to restart services with [facility].  However, the patient was very upset and speaking of their ongoing family issues.  Patient's granddaughter [name] came by and the patient expresses her ongoing issues with being home and her ability to take care of herself and make arrangements for her own care states, 'they just want to put me away but they don't have to worry with me any longer.  I cannot go on trying to make my son and daughter get along and be like sister and brother, I just wanted this to happen before I die.  I am 83 years old and I know I don't have a lot of time but, I don't want to live the rest of my days being belittled and humiliated by my daughter."   Granddaughter states that she came to visit but would be able to take the patient home once her oxygen is here [hospital] and brought here [hospital] by her daughter. Santiago Glad.  Deferred the granddaughter to the inpatient RNCM, Hassan Rowan for specific questions regarding home health and her oxygen , etc. Explained about [facility] management to follow up at home for transition of care follow up.  Patient states, "I have agreed to home health as well for the Physical Therapy. Also, spoke with the patient about [facility] role with home palliative care and how that could be an appropriate service as well.  She states, "If they need to talk to me they can just come to my home, I don't want to wait around for anything else because he [MD] said I could go home today. Verbalized understanding. Will have community care manager to follow up.  For questions, please contact:  Natividad Brood, RN BSN [facility] Liaison  [phone] business mobile phone Toll free office  [phone]

## 2016-12-01 NOTE — Progress Notes (Signed)
[facility] for Warfarin Indication: atrial fibrillation  Allergies  Allergen Reactions  . Atorvastatin Other (See Comments)     muscle aches. Pt tolerating 20mg  at home  . Codeine Nausea And Vomiting  . Morphine Nausea And Vomiting  . Other Other (See Comments)    OPIATES cause nausea and vomiting  . Rosuvastatin Other (See Comments)    muscle aches at high doses, held as of 12/2010 due to aches  . Sulfa Antibiotics Nausea And Vomiting  . Iohexol Other (See Comments)     Desc: unknown reaction; allergic to iodine and contrast     Patient Measurements: Height: 5' (152.4 cm) Weight: 121 lb (54.9 kg) (Scale C) IBW/kg (Calculated) : 45.5  Vital Signs: Temp: 97.9 F (36.6 C) (06/07 0623) Temp Source: Oral (06/07 0623) BP: 130/70 (06/07 0623) Pulse Rate: 80 (06/07 0623)  Labs:  Recent Labs  11/28/16 2218 11/29/16 0402 11/29/16 0909 11/29/16 1832 11/30/16 0517 11/30/16 1359 12/01/16 0331  HGB  --  11.3*  --   --  10.4*  --  10.3*  HCT  --  37.9  --   --  34.4*  --  34.3*  PLT  --  226  --   --  207  --  244  LABPROT  --  18.1*  --   --  19.7*  --  23.0*  INR  --  1.48  --   --  1.65  --  2.00  HEPARINUNFRC  --   --  0.19* 0.37  --  0.19*  --   CREATININE  --  1.09*  --   --  1.50*  --  1.52*  TROPONINI 0.07* 0.07* 0.06*  --   --   --   --     Estimated Creatinine Clearance: 20.7 mL/min (A) (by C-G formula based on SCr of 1.52 mg/dL (H)).   Assessment: 83 yo female admitted with SOB and chest pain concerning for worsening CHF. Pt on PTA warfarin for afib.  INR = 2, to stop heparin today CBC stable  PTA warfarin dose: 4mg  on Mon/Fri and 6mg  all other days    Goal of Therapy:  INR 2-3 Monitor platelets by anticoagulation protocol: Yes    Plan:  DC heparin and heparin labs Warfarin 4 mg po x 1 today Daily INR  Thank you Anette Guarneri, PharmD [phone] 12/01/2016 8:24 AM

## 2016-12-01 NOTE — Progress Notes (Signed)
Pt is alert and oriented forgetful family is concerned about her going home, and pt is very upset. Md  is aware and home health is ordered.

## 2016-12-01 NOTE — Progress Notes (Signed)
Progress Note  Patient Name: Adelynn Gipe Date of Encounter: 12/01/2016  Primary Cardiologist:   Dr. Percival Spanish  Subjective   She gets confused in the night/early AM.  She did ambulate yesterday but with decreased in O2 sats.  This recovers quickly. She reports that is still somewhat SOB but she thinks it is because she has a nose bleed and has a stuffed nose.  She is also quite agitated about not being able to find her glasses.    Inpatient Medications    Scheduled Meds: . amiodarone  100 mg Oral Daily  . aspirin EC  81 mg Oral Q breakfast  . atorvastatin  20 mg Oral QPM  . azelastine  1 spray Each Nare BID  . cycloSPORINE  1 drop Both Eyes BID  . insulin aspart  0-15 Units Subcutaneous TID WC  . insulin aspart  0-5 Units Subcutaneous QHS  . insulin detemir  8 Units Subcutaneous Q2200  . ipratropium-albuterol  3 mL Nebulization TID  . mometasone-formoterol  2 puff Inhalation BID  . NIFEdipine  30 mg Oral Daily  . pantoprazole  40 mg Oral Daily  . polyethylene glycol  17 g Oral BID  . QUEtiapine  25 mg Oral QHS  . sodium chloride flush  3 mL Intravenous Q12H  . torsemide  20 mg Oral BID  . traZODone  25 mg Oral QHS  . Warfarin - Pharmacist Dosing Inpatient   Does not apply q1800   Continuous Infusions: . sodium chloride    . heparin 1,050 Units/hr (12/01/16 0200)   PRN Meds: sodium chloride, acetaminophen, docusate sodium, ondansetron (ZOFRAN) IV, sodium chloride flush, traMADol   Vital Signs    Vitals:   11/30/16 1501 11/30/16 2039 11/30/16 2100 12/01/16 0623  BP:   139/63 130/70  Pulse:   78 80  Resp:   19 20  Temp:   97.8 F (36.6 C) 97.9 F (36.6 C)  TempSrc:   Oral Oral  SpO2: 97% 98% 97% 95%  Weight:    121 lb (54.9 kg)  Height:        Intake/Output Summary (Last 24 hours) at 12/01/16 0754 Last data filed at 12/01/16 0747  Gross per 24 hour  Intake           775.68 ml  Output              700 ml  Net            75.68 ml   Filed Weights   11/29/16 0037 11/30/16 0552 12/01/16 0623  Weight: 116 lb 12.8 oz (53 kg) 119 lb 11.2 oz (54.3 kg) 121 lb (54.9 kg)    Telemetry    NSR - Personally Reviewed  ECG    NA - Personally Reviewed  Physical Exam   GEN: No acute distress.   Frail Neck: No  JVD Cardiac:  RRR, no murmurs, rubs, or gallops.  Respiratory:   Decreased breath sounds but no crackles.  GI: Soft, nontender, non-distended, normal bowel sounds  MS:  Mild ankle edema; No deformity. Neuro:   Nonfocal  Psych: Oriented and appropriate  Labs    Chemistry  Recent Labs Lab 11/29/16 0402 11/30/16 0517 12/01/16 0331  NA 143 137 140  K 3.4* 4.0 4.0  CL 93* 92* 95*  CO2 39* 31 33*  GLUCOSE 163* 223* 129*  BUN 26* 31* 35*  CREATININE 1.09* 1.50* 1.52*  CALCIUM 9.4 9.1 9.2  GFRNONAA 45* 30* 30*  GFRAA 52* 35*  35*  ANIONGAP 11 14 12      Hematology  Recent Labs Lab 11/29/16 0402 11/30/16 0517 12/01/16 0331  WBC 8.8 7.4 7.5  RBC 3.74* 3.45* 3.40*  HGB 11.3* 10.4* 10.3*  HCT 37.9 34.4* 34.3*  MCV 101.3* 99.7 100.9*  MCH 30.2 30.1 30.3  MCHC 29.8* 30.2 30.0  RDW 13.0 13.0 13.4  PLT 226 207 244    Cardiac Enzymes  Recent Labs Lab 11/28/16 2218 11/29/16 0402 11/29/16 0909  TROPONINI 0.07* 0.07* 0.06*     Recent Labs Lab 11/28/16 1731  TROPIPOC 0.05     BNP  Recent Labs Lab 11/28/16 2218  BNP 1,388.3*     DDimer No results for input(s): DDIMER in the last 168 hours.   Radiology    No results found.  Cardiac Studies   ECHO:   - Left ventricle: The cavity size was normal. There was moderate   concentric hypertrophy. Systolic function was mildly reduced. The   estimated ejection fraction was in the range of 45% to 50%.   Diffuse hypokinesis. - Ventricular septum: The contour showed diastolic flattening. - Aortic valve: Trileaflet; mildly thickened, moderately calcified   leaflets. Valve area (VTI): 1.76 cm^2. Valve area (Vmax): 1.7   cm^2. Valve area (Vmean): 1.77 cm^2. -  Mitral valve: Calcified annulus. Mild prolapse, involving the   posterior leaflet. There was moderate regurgitation. - Left atrium: The atrium was mildly dilated. - Pulmonary arteries: Systolic pressure was mildly increased. PA   peak pressure: 38 mm Hg (S).  Patient Profile     83 y.o. female with patient with dyspnea and history of cardiomyopathy with previous reduced EF and CAD.  We are asked to see secondary to CHF.    Assessment & Plan    ACUTE ON CHRONIC CHF:  She does have a mildy reduced EF.  This is reported to be new compared with previous.  However, I personally reviewed and compared the echo from 2/18 and yesterday and I did not think that there is a significant difference in the EF.  I changed her back to PO Torsemide yesterday as the creat was increased.  She is stable from this standpoint. As below. At this point I don't have any other significant suggestions for med management.  She might need further pulmonary input.  I will repeat a BNP.    CAD:   Flat trend in the enzymes.  I do not suspect an acute coronary syndrome and no invasive evaluation is planned.    CKD III:  Creat elevated but stable.    MODERATE MR:  Manage medically.    Signed, Minus Breeding, MD  12/01/2016, 7:54 AM

## 2016-12-01 NOTE — Progress Notes (Signed)
Pt woke up anxious and needing to use the restroom, having already pulled off her oxygen. Pt discontent with how low the high-low bed goes, making it harder for her to attempt to get out of bed alone. Pt placed in recliner with chair alarm and oxygen on due to refusal to go back to bed. Belongings placed within reach of patient. RN attempted to reorient patient. Will continue to monitor.

## 2016-12-01 NOTE — Progress Notes (Addendum)
Pt refused bedtime meds tonight except insulin. Pt adamant about not taking sedatives or sleeping pills. Pt able to rest comfortably, except for waking up confused at 2am and attempting to get out of bed alone. Will continue to monitor.
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
## 2016-12-02 ENCOUNTER — Telehealth: Payer: Self-pay | Admitting: Family Medicine

## 2016-12-02 ENCOUNTER — Other Ambulatory Visit: Payer: Self-pay | Admitting: *Deleted

## 2016-12-02 NOTE — Patient Outreach (Signed)
Referral received from hospital liason for transition of care,  Pt hospitalized 6-4 through 12-01-16 for CHF exacerbation. Telephone call to pt for transition of care week 1, spoke with pt, HIPAA verified, pt reports " I"m doing pretty well", reports she has neighbor that continues to assist her with errands, transportation, house work, Social research officer, government.  Pt states medications prefilled by local pharmacy in bubble packs.  Home health has contacted pt for services. Pt to see primary care on 12/14/16 and appointment is scheduled with cardiology. Pt continues to weigh daily with weight today 119 pounds. Pt states " blood sugar is ok, can't remember the reading right now"  RN CM placed emphasis on CHF action plan. Pt agreeable to weekly transition of care calls and home visit.  [facility] CM Care Plan Problem One     Most Recent Value  Care Plan Problem One  Pt high risk for readmission related to CHF  Role Documenting the Problem One  Care Management [place] for Problem One  Active  [facility] Long Term Goal   Pt will demonstrate adequate self care for CHF within 90 days  [facility] Long Term Goal Start Date  12/02/16  Interventions for Problem One Long Term Goal  RN CM reviewed CHF zones and action plan      PLAN See pt for home visit 12/13/16 Continue weekly transition of care calls  Jacqlyn Larsen [facility], [place] Coordinator [phone]

## 2016-12-02 NOTE — Telephone Encounter (Signed)
Aware. Dr. Laurance Flatten will back in office on Wednesday.  They will call him early that day.

## 2016-12-03 DIAGNOSIS — R410 Disorientation, unspecified: Secondary | ICD-10-CM | POA: Diagnosis not present

## 2016-12-03 DIAGNOSIS — I2510 Atherosclerotic heart disease of native coronary artery without angina pectoris: Secondary | ICD-10-CM | POA: Diagnosis not present

## 2016-12-03 DIAGNOSIS — N183 Chronic kidney disease, stage 3 (moderate): Secondary | ICD-10-CM | POA: Diagnosis not present

## 2016-12-03 DIAGNOSIS — E1122 Type 2 diabetes mellitus with diabetic chronic kidney disease: Secondary | ICD-10-CM | POA: Diagnosis not present

## 2016-12-03 DIAGNOSIS — E785 Hyperlipidemia, unspecified: Secondary | ICD-10-CM | POA: Diagnosis not present

## 2016-12-03 DIAGNOSIS — I480 Paroxysmal atrial fibrillation: Secondary | ICD-10-CM | POA: Diagnosis not present

## 2016-12-03 DIAGNOSIS — I70203 Unspecified atherosclerosis of native arteries of extremities, bilateral legs: Secondary | ICD-10-CM | POA: Diagnosis not present

## 2016-12-03 DIAGNOSIS — Z955 Presence of coronary angioplasty implant and graft: Secondary | ICD-10-CM | POA: Diagnosis not present

## 2016-12-03 DIAGNOSIS — I5033 Acute on chronic diastolic (congestive) heart failure: Secondary | ICD-10-CM | POA: Diagnosis not present

## 2016-12-03 DIAGNOSIS — J439 Emphysema, unspecified: Secondary | ICD-10-CM | POA: Diagnosis not present

## 2016-12-03 DIAGNOSIS — J9621 Acute and chronic respiratory failure with hypoxia: Secondary | ICD-10-CM | POA: Diagnosis not present

## 2016-12-03 DIAGNOSIS — M150 Primary generalized (osteo)arthritis: Secondary | ICD-10-CM | POA: Diagnosis not present

## 2016-12-03 DIAGNOSIS — I110 Hypertensive heart disease with heart failure: Secondary | ICD-10-CM | POA: Diagnosis not present

## 2016-12-04 NOTE — Progress Notes (Signed)
Cardiology Office Note   Date:  12/07/2016   ID:  [name], DOB [birth year], MRN [id number]  PCP:  Chipper Herb, MD  Cardiologist:   Minus Breeding, MD    Chief Complaint  Patient presents with  . Shortness of Breath      History of Present Illness: [name] is a 83 y.o. female who presents for follow up after hospitalization for acute on chronic diastolic and systolic HF.  She had an echo that suggested that the EF was lower than previous.  However, I felt that it was about the same.  She did have a very mild troponin elevation with a flat trend.  She had significant issues with agitation during that admission.  We managed her conservatively with IV diuresis.    She presents today and says that her weights at home have been stable since discharge.  Her scale reads 10 lbs lighter than ours.  She saw Dr. Laurance Flatten just before me and he talked to her about entering a nursing home.  She has conflicts with her family.  She uses some salt.  It doesn't sound like she wants to use her O2 as she should.  "I don't want to become dependent."  She has some chills and hot flashes.  She denies pain.  She says that her breathing has been OK.  She does have some increased leg swelling compared to when I saw her in the hospital.       Past Medical History:  Diagnosis Date  . Anxiety   . Bradycardia    Torpol stopped  . CAD (coronary artery disease)    Cath in 2006 showed an occluded RCA with left-to-right collaterals & otherwise noncritical CAD with EF=25-30% at that time. EF subsequently improved to normal by 2D ECHO. Cath Aug. 2011 showed unchanged anatomy. > medical therapy recommended  . Carotid artery disease (HCC)    Moderate, right greater than left which we following by duplex ultrasound. [place] 12/05/11 = RIght Bulb/Prox ICA: Moderate to severe amt of fibrous plaque elevating velocities w/in prox segment of ICA. Consistent w/a 50-69% diameter reduction. Left Bulb/Prox ICA: Moderate amt  of fibrous plaque slightly elevating velocities w/in the prox segment of the ICA. Consistent w/a 0-49% diameter reduction.   . Chronic lower back pain   . COPD (chronic obstructive pulmonary disease) (HCC)    GOLD 4 - PFT 06/08/10 FEV1 0.93 (62%), FEV1% 60, TLC 2.84 (69%0, DLCO 54%, +BD) On home O2.  . Diastolic dysfunction    Grade II  . GERD (gastroesophageal reflux disease)   . Hyperlipidemia   . Hypertension   . Hypoxemic respiratory failure, chronic (HCC)    uses 2.5 liters of oxygen with sleep  . Lower extremity edema    ECHO 07/21/12 = EF 55-60%, performed for TIA. Responding to diurectics. Continue diuresis w/ a goal dry weight of <160lb. Venous Duplex 07/27/11 = Right lower extremity: no evidence of thrombus or thrombophlebitis. Essentially normal right lower extremity venous duplex Doppler evaluation.  . Myocardial infarction [facility])    "she's had several" (01/13/2015)  . On home oxygen therapy    "?L; prn" (01/13/2015)  . PAF (paroxysmal atrial fibrillation) (HCC)    In the past, on coumadin  . RBBB (right bundle branch block)    Chronic  . Secondary pulmonary hypertension   . Sleep apnea   . Stroke [facility])    "/CT scan; ~ 2014; didn't even know she'd had it"  (01/13/2015)  .  Type II diabetes mellitus (HCC)    goal A1C is 8, to avoid hypoglycemia    Past Surgical History:  Procedure Laterality Date  . CARDIAC CATHETERIZATION  2006 & 2011   Cath in 2006 showed an occluded RCA with left-to-right collaterals & otherwise noncritical CAD with EF=25-30% at that time. EF subsequently improved to normal by 2D ECHO. Cath Aug. 2011 showed unchanged anatomy. > medical therapy recommended.  . Carotid Duplex  12/05/11   Moderate, right greater than left which we following by duplex ultrasound. [place] 12/05/11 = RIght Bulb/Prox ICA: Moderate to severe amt of fibrous plaque elevating velocities w/in prox segment of ICA. Consistent w/a 50-69% diameter reduction. Left Bulb/Prox ICA: Moderate amt of  fibrous plaque slightly elevating velocities w/in the prox segment of the ICA. Consistent w/a 0-49% diameter reduction.   Marland Kitchen CATARACT EXTRACTION W/ INTRAOCULAR LENS  IMPLANT, BILATERAL Bilateral   . CORONARY ANGIOPLASTY    . CORONARY ANGIOPLASTY WITH STENT PLACEMENT     x2  . FEMUR IM NAIL Left 01/15/2015   Procedure: INTRAMEDULLARY (IM) NAIL FEMORAL LEFT ;  Surgeon: Netta Cedars, MD;  Location: [facility];  Service: Orthopedics;  Laterality: Left;  . SHOULDER OPEN ROTATOR CUFF REPAIR Right 07/2005  . TONSILLECTOMY    . TOTAL ABDOMINAL HYSTERECTOMY  ~ 1967  . TRANSVAGINAL TAPE (TVT) REMOVAL       Current Outpatient Prescriptions  Medication Sig Dispense Refill  . acetaminophen (TYLENOL) 500 MG tablet Take 500 mg by mouth every 4 (four) hours as needed for mild pain.     Marland Kitchen amiodarone (PACERONE) 200 MG tablet Take 0.5 tablets (100 mg total) by mouth daily. 15 tablet 6  . aspirin EC 81 MG tablet Take 81 mg by mouth daily with breakfast.     . atorvastatin (LIPITOR) 20 MG tablet TAKE 1 TABLET DAILY IN THE EVENING 90 tablet 0  . Azelastine HCl 0.15 % SOLN Place 1 spray into both nostrils 2 (two) times daily as needed (congestion).    . baclofen (LIORESAL) 10 MG tablet Take 10 mg by mouth 3 (three) times daily as needed for muscle spasms.    . budesonide-formoterol (SYMBICORT) 160-4.5 MCG/ACT inhaler Inhale 1 puff into the lungs 2 (two) times daily. (Patient taking differently: Inhale 1 puff into the lungs 2 (two) times daily as needed (wheezing). ) 3 Inhaler 2  . Cholecalciferol (VITAMIN D3) 2000 units TABS Take 2,000 Units by mouth daily.    Marland Kitchen docusate sodium (COLACE) 100 MG capsule Take 100 mg by mouth daily as needed for moderate constipation.     . ferrous sulfate 325 (65 FE) MG tablet TAKE 1 TABLET ONCE DAILY WITH BREAKFAST 100 tablet 4  . fluticasone (FLONASE) 50 MCG/ACT nasal spray Place 1 spray into both nostrils 2 (two) times daily. (Patient taking differently: Place 1 spray into both nostrils  as needed. ) 16 g 6  . GuaiFENesin (CHEST CONGESTION RELIEF PO) Take 1-1.5 tablets by mouth as needed.     . hydrocortisone (ANUSOL-HC) 25 MG suppository Place 1 suppository (25 mg total) rectally 2 (two) times daily as needed for hemorrhoids or itching. 12 suppository 0  . Insulin Detemir (LEVEMIR FLEXTOUCH) 100 UNIT/ML Pen INJECT 8 units once a day each morning 15 mL 6  . Insulin Pen Needle (PEN NEEDLES) 31G X 6 MM MISC USE AS DIRECTED UP TO 4 TIMES A DAY 200 each 1  . Ipratropium-Albuterol (COMBIVENT) 20-100 MCG/ACT AERS respimat Inhale 1 puff into the lungs every 6 (six)  hours as needed for wheezing or shortness of breath. 3 Inhaler 2  . Multiple Vitamins-Minerals (CENTRUM ADULTS) TABS Take 1 tablet by mouth daily with breakfast.    . neomycin-bacitracin-polymyxin (NEOSPORIN) [phone] ointment Apply 1 application topically as needed (ulcer on the legs).    Marland Kitchen NIFEdipine (PROCARDIA-XL/ADALAT-CC/NIFEDICAL-XL) 30 MG 24 hr tablet Take 1 tablet (30 mg total) by mouth daily. 30 tablet 6  . nitroGLYCERIN (NITROSTAT) 0.4 MG SL tablet Place 1 tablet (0.4 mg total) under the tongue every 5 (five) minutes as needed for chest pain. 25 tablet 0  . ondansetron (ZOFRAN ODT) 4 MG disintegrating tablet Take 1 tablet (4 mg total) by mouth every 8 (eight) hours as needed for nausea or vomiting. 12 tablet 3  . pantoprazole (PROTONIX) 40 MG tablet TAKE 1 TABLET DAILY (Patient taking differently: TAKE 40 MG TABLET DAILY) 60 tablet 1  . polyethylene glycol (MIRALAX / GLYCOLAX) packet Take 17 g by mouth 2 (two) times daily as needed for moderate constipation. 14 each 0  . potassium chloride SA (K-DUR,KLOR-CON) 20 MEQ tablet TAKE 1 TABLET DAILY 30 tablet 5  . RESTASIS MULTIDOSE 0.05 % ophthalmic emulsion Place 1 drop into both eyes 2 (two) times daily.     . simethicone (MYLICON) 80 MG chewable tablet Chew 80 mg by mouth at bedtime as needed for flatulence.    . sodium chloride (OCEAN) 0.65 % SOLN nasal spray Place 1  spray into both nostrils as needed for congestion. 60 mL 0  . torsemide (DEMADEX) 20 MG tablet 2 tab po BID, one week take 3 tab in AM and 2 tab in PM (Patient taking differently: 2 tab po BID, (12/07/16)  one week take 3 tab in AM and 2 tab in PM) 150 tablet 1  . traMADol (ULTRAM) 50 MG tablet Take 1 tablet (50 mg total) by mouth every 6 (six) hours as needed for severe pain. 15 tablet 0  . vitamin C (ASCORBIC ACID) 500 MG tablet Take 500 mg by mouth at bedtime.     Marland Kitchen warfarin (COUMADIN) 4 MG tablet TAKE 1 TABLET MONDAY AND FRIDAY, AND 1 1/2 TABLETS ALL OTHER DAYS 45 tablet 0  . ACCU-CHEK AVIVA PLUS test strip CHECK BLOOD SUGER UP TO 3 TIMES A DAY 100 each 2  . OXYGEN Inhale 3-5 L into the lungs daily.     Marland Kitchen PRODIGY TWIST TOP LANCETS 28G MISC CHECK BLOOD SUGAR UP TO 4 TIMES A DAY 200 each 2   No current facility-administered medications for this visit.     Allergies:   Atorvastatin; Codeine; Morphine; Other; Rosuvastatin; Sulfa antibiotics; and Iohexol    ROS:  Please see the history of present illness.   Otherwise, review of systems are positive for none.   All other systems are reviewed and negative.    PHYSICAL EXAM: VS:  BP 130/60   Pulse 72   Ht 5' (1.524 m)   Wt 122 lb (55.3 kg)   SpO2 (!) 80%   BMI 23.83 kg/m  , BMI Body mass index is 23.83 kg/m. GENERAL:  Frail appearing NECK:  No jugular venous distention at 90 degrees, waveform within normal limits, carotid upstroke brisk and symmetric, no bruits, no thyromegaly LUNGS:  Clear to auscultation bilaterally BACK:  No CVA tenderness CHEST:  Unremarkable HEART:  PMI not displaced or sustained,S1 and S2 within normal limits, no S3, no S4, no clicks, no rubs, no murmurs ABD:  Flat, positive bowel sounds normal in frequency in pitch, no bruits,  no rebound, no guarding, no midline pulsatile mass, no hepatomegaly, no splenomegaly EXT:  2 plus pulses throughout, mild bilateral leg edema, no cyanosis no clubbing   EKG:  EKG is not  ordered today.   Recent Labs: 08/17/2016: ALT 43 12/01/2016: B Natriuretic Peptide 468.6; BUN 35; Creatinine, Ser 1.52; Hemoglobin 10.3; Platelets 244; Potassium 4.0; Sodium 140    Lipid Panel    Component Value Date/Time   CHOL 170 07/20/2016 1202   TRIG 66 07/20/2016 1202   HDL 86 07/20/2016 1202   CHOLHDL 2.0 07/20/2016 1202   CHOLHDL 3.0 07/23/2012 0730   VLDL 19 07/23/2012 0730   LDLCALC 71 07/20/2016 1202      Wt Readings from Last 3 Encounters:  12/07/16 122 lb (55.3 kg)  12/07/16 122 lb (55.3 kg)  12/01/16 121 lb (54.9 kg)      Other studies Reviewed: Additional studies/ records that were reviewed today include: Hospital records. Review of the above records demonstrates:  Please see elsewhere in the note.     ASSESSMENT AND PLAN:  ACUTE ON CHRONIC DIASTOLIC HF:    Dr. Laurance Flatten addressed her volume status and she's going to take some increased diuretic for one week and then see him. I'm sure she'll get repeat blood work at that time. She and I discussed using no salt and keeping her feet elevated. She should be wearing her oxygen all the time as her oxygen saturations are low.  CAD:   Troponin was mildly elevated.  However, I think that this was likely demand ischemia.  She denies any chest pain today.  We will manage this conservatively.    CKD III:  Baseline creat appears to be 1.52 at discharge.   BRADYCARDIA:    She has not tolerated beta blocker in the past.  We will avoid this.    CAROTID STENOSIS:   She has had bilateral 50 - 69% stenosis a couple of years ago.  She is very frail and likely would not be a candidate for CEA or stenting.  I will discuss repeat testing in the future with the patient.   HTN:   BP is at target.  It was low in the past so I will not titrate meds.   PAF:  She was in NSR in the hospital and on Warfarin.       Current medicines are reviewed at length with the patient today.  The patient does not have concerns regarding  medicines.  The following changes have been made:  As per Dr. Laurance Flatten  Labs/ tests ordered today include:  No orders of the defined types were placed in this encounter.    Disposition:   FU with me the next time I am in [place].      Signed, Minus Breeding, MD  12/07/2016 12:59 PM    [facility]

## 2016-12-05 DIAGNOSIS — J9621 Acute and chronic respiratory failure with hypoxia: Secondary | ICD-10-CM | POA: Diagnosis not present

## 2016-12-05 DIAGNOSIS — N183 Chronic kidney disease, stage 3 (moderate): Secondary | ICD-10-CM | POA: Diagnosis not present

## 2016-12-05 DIAGNOSIS — M150 Primary generalized (osteo)arthritis: Secondary | ICD-10-CM | POA: Diagnosis not present

## 2016-12-05 DIAGNOSIS — E785 Hyperlipidemia, unspecified: Secondary | ICD-10-CM | POA: Diagnosis not present

## 2016-12-05 DIAGNOSIS — I5033 Acute on chronic diastolic (congestive) heart failure: Secondary | ICD-10-CM | POA: Diagnosis not present

## 2016-12-05 DIAGNOSIS — R410 Disorientation, unspecified: Secondary | ICD-10-CM | POA: Diagnosis not present

## 2016-12-05 DIAGNOSIS — J439 Emphysema, unspecified: Secondary | ICD-10-CM | POA: Diagnosis not present

## 2016-12-05 DIAGNOSIS — Z955 Presence of coronary angioplasty implant and graft: Secondary | ICD-10-CM | POA: Diagnosis not present

## 2016-12-05 DIAGNOSIS — I480 Paroxysmal atrial fibrillation: Secondary | ICD-10-CM | POA: Diagnosis not present

## 2016-12-05 DIAGNOSIS — I2510 Atherosclerotic heart disease of native coronary artery without angina pectoris: Secondary | ICD-10-CM | POA: Diagnosis not present

## 2016-12-05 DIAGNOSIS — I70203 Unspecified atherosclerosis of native arteries of extremities, bilateral legs: Secondary | ICD-10-CM | POA: Diagnosis not present

## 2016-12-05 DIAGNOSIS — E1122 Type 2 diabetes mellitus with diabetic chronic kidney disease: Secondary | ICD-10-CM | POA: Diagnosis not present

## 2016-12-05 DIAGNOSIS — I110 Hypertensive heart disease with heart failure: Secondary | ICD-10-CM | POA: Diagnosis not present

## 2016-12-07 ENCOUNTER — Telehealth: Payer: Self-pay | Admitting: *Deleted

## 2016-12-07 ENCOUNTER — Other Ambulatory Visit: Payer: Self-pay | Admitting: *Deleted

## 2016-12-07 ENCOUNTER — Encounter: Payer: Self-pay | Admitting: Family Medicine

## 2016-12-07 ENCOUNTER — Ambulatory Visit (INDEPENDENT_AMBULATORY_CARE_PROVIDER_SITE_OTHER): Payer: Medicare Other | Admitting: Cardiology

## 2016-12-07 ENCOUNTER — Ambulatory Visit (INDEPENDENT_AMBULATORY_CARE_PROVIDER_SITE_OTHER): Payer: Medicare Other | Admitting: Family Medicine

## 2016-12-07 ENCOUNTER — Encounter: Payer: Self-pay | Admitting: Cardiology

## 2016-12-07 ENCOUNTER — Ambulatory Visit (INDEPENDENT_AMBULATORY_CARE_PROVIDER_SITE_OTHER): Payer: Medicare Other

## 2016-12-07 VITALS — BP 100/57 | HR 73 | Temp 97.0°F | Ht 60.0 in | Wt 122.0 lb

## 2016-12-07 VITALS — BP 130/60 | HR 72 | Ht 60.0 in | Wt 122.0 lb

## 2016-12-07 DIAGNOSIS — N183 Chronic kidney disease, stage 3 unspecified: Secondary | ICD-10-CM

## 2016-12-07 DIAGNOSIS — R0602 Shortness of breath: Secondary | ICD-10-CM

## 2016-12-07 DIAGNOSIS — R609 Edema, unspecified: Secondary | ICD-10-CM | POA: Diagnosis not present

## 2016-12-07 DIAGNOSIS — I5021 Acute systolic (congestive) heart failure: Secondary | ICD-10-CM

## 2016-12-07 DIAGNOSIS — E559 Vitamin D deficiency, unspecified: Secondary | ICD-10-CM

## 2016-12-07 DIAGNOSIS — E1122 Type 2 diabetes mellitus with diabetic chronic kidney disease: Secondary | ICD-10-CM | POA: Diagnosis not present

## 2016-12-07 DIAGNOSIS — Z794 Long term (current) use of insulin: Secondary | ICD-10-CM | POA: Diagnosis not present

## 2016-12-07 DIAGNOSIS — I10 Essential (primary) hypertension: Secondary | ICD-10-CM | POA: Diagnosis not present

## 2016-12-07 DIAGNOSIS — D631 Anemia in chronic kidney disease: Secondary | ICD-10-CM

## 2016-12-07 DIAGNOSIS — E785 Hyperlipidemia, unspecified: Secondary | ICD-10-CM | POA: Diagnosis not present

## 2016-12-07 DIAGNOSIS — I25119 Atherosclerotic heart disease of native coronary artery with unspecified angina pectoris: Secondary | ICD-10-CM | POA: Diagnosis not present

## 2016-12-07 DIAGNOSIS — N182 Chronic kidney disease, stage 2 (mild): Secondary | ICD-10-CM | POA: Diagnosis not present

## 2016-12-07 DIAGNOSIS — I480 Paroxysmal atrial fibrillation: Secondary | ICD-10-CM | POA: Diagnosis not present

## 2016-12-07 LAB — COAGUCHEK XS/INR WAIVED
INR: 1.4 — AB (ref 0.9–1.1)
Prothrombin Time: 17.2 s

## 2016-12-07 LAB — GLUCOSE HEMOCUE WAIVED: GLU HEMOCUE WAIVED: 331 mg/dL — AB (ref 65–99)

## 2016-12-07 LAB — BAYER DCA HB A1C WAIVED: HB A1C: 7.5 % — AB (ref <7.0)

## 2016-12-07 MED ORDER — TORSEMIDE 20 MG PO TABS: 20.0000 mg | ORAL_TABLET | Freq: Every day | ORAL | 0 refills | Status: AC

## 2016-12-07 NOTE — Telephone Encounter (Signed)
Per x-ray report patient to take the extra fluid pill for 1 week Rx Demadex 20 mg one tab daily for one week #20. Note to pharmacy not to blister pack sent to [facility].

## 2016-12-07 NOTE — Patient Instructions (Signed)
Medication Instructions:  The current medical regimen is effective;  continue present plan and medications.  Follow-Up: Follow up in 2 weeks with Dr Laurance Flatten. Follow up in 1 month with Dr Percival Spanish.  Thank you for choosing [facility]!!

## 2016-12-07 NOTE — Progress Notes (Signed)
Subjective:    Patient ID: [name], female    DOB: 1927/10/24, 83 y.o.   MRN: [id number]  HPI Pt here for follow up and management of chronic medical problems which includes a fib, hyperlipidemia, hypertension, and diabetes. She is taking medication regularly.The patient comes to the visit today with her daughter-in-law and power of attorney. The sinuses are spoken with me today about his concern and his sister's concern for their mother who has become much more agitated recently and concerns about her living by herself at home and trying to cook and burning candles etc. In reviewing her record she has had 6 or 7 hospitalizations in the past year. There is concern about whether she is taking her medicine correctly. There is concern about her safety at home. She is becoming very agitated recently and angry at the son and daughter-in-law and(daughter. She also has had agitated behavior in the hospital and has been combative with nurses. She does not remember a lot of this. She has anxiety COPD diabetes mellitus sleep apnea hyperlipidemia hypertension heart disease and a remote history of stroke. She also has paroxysmal atrial fibrillation and carotid artery disease. Patient complains of edema and shortness of breath. The initial pulse ox in the office range from 55-78. She is on O2. She denies any chest pain but does continue to complain of shortness of breath. She does have more edema. Her medicines come prepackaged to her in her home. A neighbor has told another member this practice that she hides her medicine and does not take her medicine regularly. She denies any trouble with her stomach other than some early morning nausea. Her bowels are moving well there is no sign of any blood loss according to the patient and her daughter-in-law. She is passing her water without problems.     Patient Active Problem List   Diagnosis Date Noted  . CHF exacerbation ([place]) 11/28/2016  . Chest pain 11/28/2016    . Acute diastolic CHF (congestive heart failure) ([place]) 11/28/2016  . Elevated troponin 11/28/2016  . Anemia 08/08/2016  . Microalbuminuria 06/30/2016  . CKD stage 3 secondary to diabetes ([place]) 05/02/2016  . Atherosclerotic peripheral vascular disease ([place]) 11/04/2015  . Osteopenia 01/05/2015  . Primary osteoarthritis involving multiple joints 06/12/2014  . CHF (congestive heart failure) ([place]) 12/28/2013  . DM type 2 causing CKD stage 3 ([place]) 07/30/2012  . Ataxia 07/20/2012  . Weakness of both legs 07/20/2012  . Falls frequently 07/20/2012  . RBBB 08/25/2011  . CAD S/P percutaneous coronary angioplasty 08/25/2011  . PVD (peripheral vascular disease), moderate carotid and LSCA disease 08/25/2011  . HTN (hypertension) 08/22/2011  . Gait instability 10/11/2010  . PAF (paroxysmal atrial fibrillation) ([place]) 07/06/2010  . COPD with emphysema ([place]) 06/16/2010  . Chronic respiratory failure with hypoxia ([place]) 06/07/2010  . Hyperlipidemia LDL goal <70 02/11/2010   Outpatient Encounter Prescriptions as of 12/07/2016  Medication Sig  . ACCU-CHEK AVIVA PLUS test strip CHECK BLOOD SUGER UP TO 3 TIMES A DAY  . acetaminophen (TYLENOL) 500 MG tablet Take 500 mg by mouth every 4 (four) hours as needed for mild pain.   Marland Kitchen amiodarone (PACERONE) 200 MG tablet Take 0.5 tablets (100 mg total) by mouth daily.  Marland Kitchen aspirin EC 81 MG tablet Take 81 mg by mouth daily with breakfast.   . atorvastatin (LIPITOR) 20 MG tablet TAKE 1 TABLET DAILY IN THE EVENING  . Azelastine HCl 0.15 % SOLN Place 1 spray into both nostrils 2 (two) times  daily as needed (congestion).  . baclofen (LIORESAL) 10 MG tablet TAKE 1/2 TABLET EVERY 8 HOURS AS NEEDED FOR MUSCLE SPASM (Patient taking differently: TAKE 5MG TABLET EVERY 8 HOURS AS NEEDED FOR MUSCLE SPASM)  . budesonide-formoterol (SYMBICORT) 160-4.5 MCG/ACT inhaler Inhale 1 puff into the lungs 2 (two) times daily. (Patient taking differently: Inhale 1 puff into the lungs 2 (two)  times daily as needed (wheezing). )  . Cholecalciferol (VITAMIN D3) 2000 units TABS Take 2,000 Units by mouth daily.  Marland Kitchen docusate sodium (COLACE) 100 MG capsule Take 100 mg by mouth daily as needed for moderate constipation.   . ferrous sulfate 325 (65 FE) MG tablet TAKE 1 TABLET ONCE DAILY WITH BREAKFAST  . fluticasone (FLONASE) 50 MCG/ACT nasal spray Place 1 spray into both nostrils 2 (two) times daily. (Patient taking differently: Place 1 spray into both nostrils as needed. )  . GuaiFENesin (CHEST CONGESTION RELIEF PO) Take 1-1.5 tablets by mouth as needed.   . hydrocortisone (ANUSOL-HC) 25 MG suppository Place 1 suppository (25 mg total) rectally 2 (two) times daily as needed for hemorrhoids or itching.  . Insulin Detemir (LEVEMIR FLEXTOUCH) 100 UNIT/ML Pen INJECT 8 units once a day each morning  . Insulin Pen Needle (PEN NEEDLES) 31G X 6 MM MISC USE AS DIRECTED UP TO 4 TIMES A DAY  . Ipratropium-Albuterol (COMBIVENT) 20-100 MCG/ACT AERS respimat Inhale 1 puff into the lungs every 6 (six) hours as needed for wheezing or shortness of breath.  . Multiple Vitamins-Minerals (CENTRUM ADULTS) TABS Take 1 tablet by mouth daily with breakfast.  . neomycin-bacitracin-polymyxin (NEOSPORIN) [phone] ointment Apply 1 application topically as needed (ulcer on the legs).  Marland Kitchen NIFEdipine (PROCARDIA-XL/ADALAT-CC/NIFEDICAL-XL) 30 MG 24 hr tablet Take 1 tablet (30 mg total) by mouth daily.  . nitroGLYCERIN (NITROSTAT) 0.4 MG SL tablet Place 1 tablet (0.4 mg total) under the tongue every 5 (five) minutes as needed for chest pain.  Marland Kitchen ondansetron (ZOFRAN ODT) 4 MG disintegrating tablet Take 1 tablet (4 mg total) by mouth every 8 (eight) hours as needed for nausea or vomiting.  . OXYGEN Inhale 3-5 L into the lungs daily.   . pantoprazole (PROTONIX) 40 MG tablet TAKE 1 TABLET DAILY (Patient taking differently: TAKE 40 MG TABLET DAILY)  . polyethylene glycol (MIRALAX / GLYCOLAX) packet Take 17 g by mouth 2 (two) times  daily as needed for moderate constipation.  . potassium chloride SA (K-DUR,KLOR-CON) 20 MEQ tablet TAKE 1 TABLET DAILY  . PRODIGY TWIST TOP LANCETS 28G MISC CHECK BLOOD SUGAR UP TO 4 TIMES A DAY  . RESTASIS MULTIDOSE 0.05 % ophthalmic emulsion   . simethicone (MYLICON) 80 MG chewable tablet Chew 80 mg by mouth at bedtime as needed for flatulence.  . sodium chloride (OCEAN) 0.65 % SOLN nasal spray Place 1 spray into both nostrils as needed for congestion.  . torsemide (DEMADEX) 20 MG tablet 2 tab po BID, one week take 3 tab in AM and 2 tab in PM  . traMADol (ULTRAM) 50 MG tablet Take 1 tablet (50 mg total) by mouth every 6 (six) hours as needed for severe pain.  . vitamin C (ASCORBIC ACID) 500 MG tablet Take 500 mg by mouth at bedtime.   Marland Kitchen warfarin (COUMADIN) 4 MG tablet TAKE 1 TABLET MONDAY AND FRIDAY, AND 1 1/2 TABLETS ALL OTHER DAYS   No facility-administered encounter medications on file as of 12/07/2016.      Review of Systems  Constitutional: Negative.   HENT: Negative.   Eyes:  Negative.   Respiratory: Positive for shortness of breath.   Cardiovascular: Positive for leg swelling.  Gastrointestinal: Negative.   Endocrine: Negative.   Genitourinary: Negative.   Musculoskeletal: Negative.   Skin: Negative.   Allergic/Immunologic: Negative.   Neurological: Negative.   Hematological: Negative.   Psychiatric/Behavioral: Negative.        Objective:   Physical Exam  Constitutional: She is oriented to person, place, and time. She appears well-developed and well-nourished. She appears distressed.  HENT:  Head: Normocephalic.  Right Ear: External ear normal.  Left Ear: External ear normal.  Nose: Nose normal.  Mouth/Throat: Oropharynx is clear and moist.  Eyes: Conjunctivae and EOM are normal. Pupils are equal, round, and reactive to light. Right eye exhibits no discharge. Left eye exhibits no discharge. No scleral icterus.  Neck: Normal range of motion. Neck supple. No thyromegaly  present.  No thyromegaly or adenopathy  Cardiovascular: Normal rate and regular rhythm.   No murmur heard. The heart is regular at about 72/m  Pulmonary/Chest: Effort normal. No respiratory distress. She has no wheezes. She has rales.  Maybe something rales in the right base Diminished breath sounds bilaterally on 3 L of O2  Abdominal: Soft. Bowel sounds are normal. She exhibits no mass. There is no tenderness. There is no rebound and no guarding.  Musculoskeletal: She exhibits edema. She exhibits no tenderness.  Lymphadenopathy:    She has no cervical adenopathy.  Neurological: She is alert and oriented to person, place, and time. No cranial nerve deficit.  Skin: Skin is warm and dry. No rash noted.  Psychiatric: She has a normal mood and affect. Her behavior is normal. Judgment and thought content normal.  Nursing note and vitals reviewed.  BP (!) 100/57 (BP Location: Left Arm)   Pulse 73   Temp 97 F (36.1 C) (Oral)   Ht 5' (1.524 m)   Wt 122 lb (55.3 kg)   SpO2 (!) 78%   BMI 23.83 kg/m   The patient was agitated during the visit did not need but at her family and her situation. We discussed the importance of taking the stress off of the family and trying to have better care for her in an intermediate care facility like [facility]. She agreed to let the family go visit and take her back to visit after that. She is actually seen the cardiologist today. Any recommendations that he makes we will certainly appreciate.      Assessment & Plan:  1. PAF (paroxysmal atrial fibrillation) (HCC) -Continue to follow-up with cardiology as planned - CBC with Differential/Platelet - CoaguChek XS/INR Waived  2. Anemia in stage 2 chronic kidney disease - CBC with Differential/Platelet  3. Type 2 diabetes mellitus with stage 3 chronic kidney disease, with long-term current use of insulin (HCC) - CBC with Differential/Platelet - BMP8+EGFR - Glucose Hemocue Waived - Bayer DCA Hb A1c  Waived  4. Hyperlipidemia LDL goal <70 -Continue current treatment - CBC with Differential/Platelet - Lipid panel  5. Essential hypertension -Take additional Demadex as directed and we will call the pharmacy and make sure they repack is the medicine to include this additional Demadex for 1 week - CBC with Differential/Platelet - BMP8+EGFR - Hepatic function panel  6. Vitamin D deficiency - CBC with Differential/Platelet - VITAMIN D 25 Hydroxy (Vit-D Deficiency, Fractures)  7. CKD stage 3 secondary to diabetes ([place]) -Take additional Demadex as directed and recheck BMP in 1 week - CBC with Differential/Platelet - BMP8+EGFR  8. SOB (shortness  of breath) -The best oxygen level that we can get today was about 78%. - DG Chest 2 View; Future  9. Edema, unspecified type -Take extra Demadex or as directed by cardiologist who she is now seeing - DG Chest 2 View; Future   No orders of the defined types were placed in this encounter.  Patient Instructions                       Medicare Annual Wellness Visit  [facility] and the medical providers at [facility] strive to bring you the best medical care.  In doing so we not only want to address your current medical conditions and concerns but also to detect new conditions early and prevent illness, disease and health-related problems.    Medicare offers a yearly Wellness Visit which allows our clinical staff to assess your need for preventative services including immunizations, lifestyle education, counseling to decrease risk of preventable diseases and screening for fall risk and other medical concerns.    This visit is provided free of charge (no copay) for all Medicare recipients. The clinical pharmacists at [facility] have begun to conduct these Wellness Visits which will also include a thorough review of all your medications.    As you primary medical provider recommend that you make an  appointment for your Annual Wellness Visit if you have not done so already this year.  You may set up this appointment before you leave today or you may call back ([phone]) and schedule an appointment.  Please make sure when you call that you mention that you are scheduling your Annual Wellness Visit with the clinical pharmacist so that the appointment may be made for the proper length of time.     Continue current medications. Continue good therapeutic lifestyle changes which include good diet and exercise. Fall precautions discussed with patient. If an FOBT was given today- please return it to our front desk. If you are over 75 years old - you may need Prevnar 29 or the adult Pneumonia vaccine.  **Flu shots are available--- please call and schedule a FLU-CLINIC appointment**  After your visit with [place] today you will receive a survey in the mail or online from [employer] regarding your care with [place]. Please take a moment to fill this out. Your feedback is very important to [place] as you can help [place] better understand your patient needs as well as improve your experience and satisfaction. WE CARE ABOUT YOU!!!     Arrie Senate MD

## 2016-12-07 NOTE — Patient Instructions (Addendum)
Medicare Annual Wellness Visit  [facility] and the medical providers at [facility] strive to bring you the best medical care.  In doing so we not only want to address your current medical conditions and concerns but also to detect new conditions early and prevent illness, disease and health-related problems.    Medicare offers a yearly Wellness Visit which allows our clinical staff to assess your need for preventative services including immunizations, lifestyle education, counseling to decrease risk of preventable diseases and screening for fall risk and other medical concerns.    This visit is provided free of charge (no copay) for all Medicare recipients. The clinical pharmacists at [facility] have begun to conduct these Wellness Visits which will also include a thorough review of all your medications.    As you primary medical provider recommend that you make an appointment for your Annual Wellness Visit if you have not done so already this year.  You may set up this appointment before you leave today or you may call back ([phone]) and schedule an appointment.  Please make sure when you call that you mention that you are scheduling your Annual Wellness Visit with the clinical pharmacist so that the appointment may be made for the proper length of time.     Continue current medications. Continue good therapeutic lifestyle changes which include good diet and exercise. Fall precautions discussed with patient. If an FOBT was given today- please return it to our front desk. If you are over 46 years old - you may need Prevnar 43 or the adult Pneumonia vaccine.  **Flu shots are available--- please call and schedule a FLU-CLINIC appointment**  After your visit with [place] today you will receive a survey in the mail or online from [employer] regarding your care with [place]. Please take a moment to fill this out. Your feedback is very  important to [place] as you can help [place] better understand your patient needs as well as improve your experience and satisfaction. WE CARE ABOUT YOU!!!   Take extra Demadex as directed Have Coralyn Mark to go [place] [facility] [place] directions of cardiology with his visit today. Grow old gracefully

## 2016-12-07 NOTE — Patient Outreach (Addendum)
Red Emmi on 12/06/16 for pt not having primary MD appointment scheduled, pt had previously stated she does have post hospital follow appointment scheduled, telephone call to pt, verified pt does have primary MD follow up appointment originally scheduled for 12/09/16, also noted in medical record pt did see primary care MD today Dr. Morrie [name] for follow up.  Pt does get confused with the Emmi calls due to difficulty hearing.  Jacqlyn Larsen [facility], [place] Coordinator [phone]

## 2016-12-08 DIAGNOSIS — N183 Chronic kidney disease, stage 3 (moderate): Secondary | ICD-10-CM | POA: Diagnosis not present

## 2016-12-08 DIAGNOSIS — I480 Paroxysmal atrial fibrillation: Secondary | ICD-10-CM | POA: Diagnosis not present

## 2016-12-08 DIAGNOSIS — Z955 Presence of coronary angioplasty implant and graft: Secondary | ICD-10-CM | POA: Diagnosis not present

## 2016-12-08 DIAGNOSIS — I2510 Atherosclerotic heart disease of native coronary artery without angina pectoris: Secondary | ICD-10-CM | POA: Diagnosis not present

## 2016-12-08 DIAGNOSIS — I110 Hypertensive heart disease with heart failure: Secondary | ICD-10-CM | POA: Diagnosis not present

## 2016-12-08 DIAGNOSIS — I70203 Unspecified atherosclerosis of native arteries of extremities, bilateral legs: Secondary | ICD-10-CM | POA: Diagnosis not present

## 2016-12-08 DIAGNOSIS — R410 Disorientation, unspecified: Secondary | ICD-10-CM | POA: Diagnosis not present

## 2016-12-08 DIAGNOSIS — J439 Emphysema, unspecified: Secondary | ICD-10-CM | POA: Diagnosis not present

## 2016-12-08 DIAGNOSIS — M150 Primary generalized (osteo)arthritis: Secondary | ICD-10-CM | POA: Diagnosis not present

## 2016-12-08 DIAGNOSIS — I5033 Acute on chronic diastolic (congestive) heart failure: Secondary | ICD-10-CM | POA: Diagnosis not present

## 2016-12-08 DIAGNOSIS — E1122 Type 2 diabetes mellitus with diabetic chronic kidney disease: Secondary | ICD-10-CM | POA: Diagnosis not present

## 2016-12-08 DIAGNOSIS — E785 Hyperlipidemia, unspecified: Secondary | ICD-10-CM | POA: Diagnosis not present

## 2016-12-08 DIAGNOSIS — J9621 Acute and chronic respiratory failure with hypoxia: Secondary | ICD-10-CM | POA: Diagnosis not present

## 2016-12-08 LAB — BMP8+EGFR
BUN / CREAT RATIO: 27 (ref 12–28)
BUN: 36 mg/dL — ABNORMAL HIGH (ref 8–27)
CALCIUM: 9.8 mg/dL (ref 8.7–10.3)
CHLORIDE: 94 mmol/L — AB (ref 96–106)
CO2: 34 mmol/L — ABNORMAL HIGH (ref 20–29)
Creatinine, Ser: 1.32 mg/dL — ABNORMAL HIGH (ref 0.57–1.00)
GFR, EST AFRICAN AMERICAN: 42 mL/min/{1.73_m2} — AB (ref >59)
GFR, EST NON AFRICAN AMERICAN: 37 mL/min/{1.73_m2} — AB (ref >59)
Glucose: 301 mg/dL — ABNORMAL HIGH (ref 65–99)
POTASSIUM: 4.5 mmol/L (ref 3.5–5.2)
Sodium: 143 mmol/L (ref 134–144)

## 2016-12-08 LAB — CBC WITH DIFFERENTIAL/PLATELET
BASOS ABS: 0.1 10*3/uL (ref 0.0–0.2)
Basos: 2 %
EOS (ABSOLUTE): 0.5 10*3/uL — AB (ref 0.0–0.4)
EOS: 7 %
HEMATOCRIT: 33.2 % — AB (ref 34.0–46.6)
Hemoglobin: 10.1 g/dL — ABNORMAL LOW (ref 11.1–15.9)
IMMATURE GRANULOCYTES: 0 %
Immature Grans (Abs): 0 10*3/uL (ref 0.0–0.1)
LYMPHS ABS: 0.6 10*3/uL — AB (ref 0.7–3.1)
Lymphs: 8 %
MCH: 30.4 pg (ref 26.6–33.0)
MCHC: 30.4 g/dL — AB (ref 31.5–35.7)
MCV: 100 fL — AB (ref 79–97)
MONOS ABS: 0.6 10*3/uL (ref 0.1–0.9)
Monocytes: 9 %
NEUTROS PCT: 74 %
Neutrophils Absolute: 4.8 10*3/uL (ref 1.4–7.0)
PLATELETS: 294 10*3/uL (ref 150–379)
RBC: 3.32 x10E6/uL — AB (ref 3.77–5.28)
RDW: 13.1 % (ref 12.3–15.4)
WBC: 6.6 10*3/uL (ref 3.4–10.8)

## 2016-12-08 LAB — HEPATIC FUNCTION PANEL
ALT: 29 IU/L (ref 0–32)
AST: 19 IU/L (ref 0–40)
Albumin: 3.6 g/dL (ref 3.5–4.7)
Alkaline Phosphatase: 113 IU/L (ref 39–117)
BILIRUBIN TOTAL: 0.4 mg/dL (ref 0.0–1.2)
Bilirubin, Direct: 0.18 mg/dL (ref 0.00–0.40)
Total Protein: 6 g/dL (ref 6.0–8.5)

## 2016-12-08 LAB — LIPID PANEL
CHOL/HDL RATIO: 2.1 ratio (ref 0.0–4.4)
Cholesterol, Total: 163 mg/dL (ref 100–199)
HDL: 77 mg/dL (ref >39)
LDL Calculated: 69 mg/dL (ref 0–99)
TRIGLYCERIDES: 86 mg/dL (ref 0–149)
VLDL Cholesterol Cal: 17 mg/dL (ref 5–40)

## 2016-12-08 LAB — VITAMIN D 25 HYDROXY (VIT D DEFICIENCY, FRACTURES): VIT D 25 HYDROXY: 32.7 ng/mL (ref 30.0–100.0)

## 2016-12-09 ENCOUNTER — Other Ambulatory Visit: Payer: Self-pay | Admitting: *Deleted

## 2016-12-09 MED ORDER — MUPIROCIN 2 % EX OINT: 1.0000 "application " | TOPICAL_OINTMENT | Freq: Two times a day (BID) | CUTANEOUS | 0 refills | Status: AC

## 2016-12-09 NOTE — Patient Outreach (Signed)
Telephone call to patient for transition of care week 2, spoke with pt, conversation limited as pt states she cannot talk for very long and cannot hear well over the phone.  Pt states she is weighing daily with weight today 115 pounds, CBG ranges 109-125.  Home health continues, no new concerns voiced.  RN CM reminded pt of home visit next week.  Jacqlyn Larsen [facility], [place] Coordinator [phone]

## 2016-12-11 DIAGNOSIS — I259 Chronic ischemic heart disease, unspecified: Secondary | ICD-10-CM | POA: Diagnosis not present

## 2016-12-11 DIAGNOSIS — J449 Chronic obstructive pulmonary disease, unspecified: Secondary | ICD-10-CM | POA: Diagnosis not present

## 2016-12-12 DIAGNOSIS — J439 Emphysema, unspecified: Secondary | ICD-10-CM | POA: Diagnosis not present

## 2016-12-12 DIAGNOSIS — R410 Disorientation, unspecified: Secondary | ICD-10-CM | POA: Diagnosis not present

## 2016-12-12 DIAGNOSIS — M150 Primary generalized (osteo)arthritis: Secondary | ICD-10-CM | POA: Diagnosis not present

## 2016-12-12 DIAGNOSIS — I5033 Acute on chronic diastolic (congestive) heart failure: Secondary | ICD-10-CM | POA: Diagnosis not present

## 2016-12-12 DIAGNOSIS — N183 Chronic kidney disease, stage 3 (moderate): Secondary | ICD-10-CM | POA: Diagnosis not present

## 2016-12-12 DIAGNOSIS — I480 Paroxysmal atrial fibrillation: Secondary | ICD-10-CM | POA: Diagnosis not present

## 2016-12-12 DIAGNOSIS — I110 Hypertensive heart disease with heart failure: Secondary | ICD-10-CM | POA: Diagnosis not present

## 2016-12-12 DIAGNOSIS — I70203 Unspecified atherosclerosis of native arteries of extremities, bilateral legs: Secondary | ICD-10-CM | POA: Diagnosis not present

## 2016-12-12 DIAGNOSIS — J9621 Acute and chronic respiratory failure with hypoxia: Secondary | ICD-10-CM | POA: Diagnosis not present

## 2016-12-12 DIAGNOSIS — Z955 Presence of coronary angioplasty implant and graft: Secondary | ICD-10-CM | POA: Diagnosis not present

## 2016-12-12 DIAGNOSIS — I2510 Atherosclerotic heart disease of native coronary artery without angina pectoris: Secondary | ICD-10-CM | POA: Diagnosis not present

## 2016-12-12 DIAGNOSIS — E1122 Type 2 diabetes mellitus with diabetic chronic kidney disease: Secondary | ICD-10-CM | POA: Diagnosis not present

## 2016-12-12 DIAGNOSIS — E785 Hyperlipidemia, unspecified: Secondary | ICD-10-CM | POA: Diagnosis not present

## 2016-12-13 ENCOUNTER — Ambulatory Visit (INDEPENDENT_AMBULATORY_CARE_PROVIDER_SITE_OTHER): Payer: Medicare Other | Admitting: Family Medicine

## 2016-12-13 ENCOUNTER — Encounter: Payer: Self-pay | Admitting: *Deleted

## 2016-12-13 ENCOUNTER — Encounter: Payer: Self-pay | Admitting: Family Medicine

## 2016-12-13 ENCOUNTER — Other Ambulatory Visit: Payer: Self-pay | Admitting: *Deleted

## 2016-12-13 VITALS — BP 123/64 | HR 76 | Temp 97.9°F | Ht 60.0 in | Wt 122.0 lb

## 2016-12-13 DIAGNOSIS — N183 Chronic kidney disease, stage 3 unspecified: Secondary | ICD-10-CM

## 2016-12-13 DIAGNOSIS — L03119 Cellulitis of unspecified part of limb: Secondary | ICD-10-CM | POA: Diagnosis not present

## 2016-12-13 DIAGNOSIS — I70209 Unspecified atherosclerosis of native arteries of extremities, unspecified extremity: Secondary | ICD-10-CM

## 2016-12-13 DIAGNOSIS — E785 Hyperlipidemia, unspecified: Secondary | ICD-10-CM | POA: Diagnosis not present

## 2016-12-13 DIAGNOSIS — R600 Localized edema: Secondary | ICD-10-CM | POA: Diagnosis not present

## 2016-12-13 DIAGNOSIS — E1122 Type 2 diabetes mellitus with diabetic chronic kidney disease: Secondary | ICD-10-CM

## 2016-12-13 DIAGNOSIS — Z794 Long term (current) use of insulin: Secondary | ICD-10-CM

## 2016-12-13 DIAGNOSIS — I480 Paroxysmal atrial fibrillation: Secondary | ICD-10-CM | POA: Diagnosis not present

## 2016-12-13 LAB — COAGUCHEK XS/INR WAIVED
INR: 1.5 — ABNORMAL HIGH (ref 0.9–1.1)
PROTHROMBIN TIME: 17.8 s

## 2016-12-13 NOTE — Progress Notes (Signed)
Subjective:    Patient ID: [name], female    DOB: 29-Jun-1927, 83 y.o.   MRN: [id number]  HPI Pt here today for 1 week follow up on edema and SOB. Today's visit is a follow-up from 1 week ago when she was having bleeding issues and increased edema. At that time she was told to take to Demadex in the morning 20 mg each and 1 in the evening. Right after our visit she did see the cardiologist and he agreed with the treatment plan. He also requested her to stay on her oxygen continuously. The patient is still on O2 with oxygen levels on the O2 at 65-74%. She is on 5 L. We have information from several sources that she is not taking her fluid pills correctly from home and we are hesitant to increase this anymore. Since the last visit her weight is actually increased slightly. Her O2 levels are similar to what they were previously. This patient needs supervision. The family is looking at nursing home placement for better care. The patient has her medications prepackaged but we do not believe she is taking them correctly. She still has shortness of breath. She still has edema. She has a wound of the right lower leg that came from edema with a blister and the dressing was removed and pill the upper outer edge of the blister off.    Patient Active Problem List   Diagnosis Date Noted  . acute on chronic systolic heart failure 22/29/7989  . Coronary artery disease involving native coronary artery of native heart with angina pectoris ([place]) 12/07/2016  . CHF exacerbation ([place]) 11/28/2016  . Chest pain 11/28/2016  . Acute diastolic CHF (congestive heart failure) ([place]) 11/28/2016  . Elevated troponin 11/28/2016  . Anemia 08/08/2016  . Microalbuminuria 06/30/2016  . CKD stage 3 secondary to diabetes ([place]) 05/02/2016  . Atherosclerotic peripheral vascular disease ([place]) 11/04/2015  . Osteopenia 01/05/2015  . Primary osteoarthritis involving multiple joints 06/12/2014  . CHF (congestive heart failure)  ([place]) 12/28/2013  . DM type 2 causing CKD stage 3 ([place]) 07/30/2012  . Ataxia 07/20/2012  . Weakness of both legs 07/20/2012  . Falls frequently 07/20/2012  . RBBB 08/25/2011  . CAD S/P percutaneous coronary angioplasty 08/25/2011  . PVD (peripheral vascular disease), moderate carotid and LSCA disease 08/25/2011  . HTN (hypertension) 08/22/2011  . Gait instability 10/11/2010  . PAF (paroxysmal atrial fibrillation) ([place]) 07/06/2010  . COPD with emphysema ([place]) 06/16/2010  . Chronic respiratory failure with hypoxia ([place]) 06/07/2010  . Hyperlipidemia LDL goal <70 02/11/2010   Outpatient Encounter Prescriptions as of 12/13/2016  Medication Sig  . ACCU-CHEK AVIVA PLUS test strip CHECK BLOOD SUGER UP TO 3 TIMES A DAY  . acetaminophen (TYLENOL) 500 MG tablet Take 500 mg by mouth every 4 (four) hours as needed for mild pain.   Marland Kitchen amiodarone (PACERONE) 200 MG tablet Take 0.5 tablets (100 mg total) by mouth daily.  Marland Kitchen aspirin EC 81 MG tablet Take 81 mg by mouth daily with breakfast.   . atorvastatin (LIPITOR) 20 MG tablet TAKE 1 TABLET DAILY IN THE EVENING  . Azelastine HCl 0.15 % SOLN Place 1 spray into both nostrils 2 (two) times daily as needed (congestion).  . baclofen (LIORESAL) 10 MG tablet Take 10 mg by mouth 3 (three) times daily as needed for muscle spasms.  . budesonide-formoterol (SYMBICORT) 160-4.5 MCG/ACT inhaler Inhale 1 puff into the lungs 2 (two) times daily. (Patient taking differently: Inhale 1 puff into the  lungs 2 (two) times daily as needed (wheezing). )  . Cholecalciferol (VITAMIN D3) 2000 units TABS Take 2,000 Units by mouth daily.  Marland Kitchen docusate sodium (COLACE) 100 MG capsule Take 100 mg by mouth daily as needed for moderate constipation.   . ferrous sulfate 325 (65 FE) MG tablet TAKE 1 TABLET ONCE DAILY WITH BREAKFAST  . fluticasone (FLONASE) 50 MCG/ACT nasal spray Place 1 spray into both nostrils 2 (two) times daily. (Patient taking differently: Place 1 spray into both nostrils  as needed. )  . GuaiFENesin (CHEST CONGESTION RELIEF PO) Take 1-1.5 tablets by mouth as needed.   . hydrocortisone (ANUSOL-HC) 25 MG suppository Place 1 suppository (25 mg total) rectally 2 (two) times daily as needed for hemorrhoids or itching.  . Insulin Detemir (LEVEMIR FLEXTOUCH) 100 UNIT/ML Pen INJECT 8 units once a day each morning  . Insulin Pen Needle (PEN NEEDLES) 31G X 6 MM MISC USE AS DIRECTED UP TO 4 TIMES A DAY  . Ipratropium-Albuterol (COMBIVENT) 20-100 MCG/ACT AERS respimat Inhale 1 puff into the lungs every 6 (six) hours as needed for wheezing or shortness of breath.  . Multiple Vitamins-Minerals (CENTRUM ADULTS) TABS Take 1 tablet by mouth daily with breakfast.  . mupirocin ointment (BACTROBAN) 2 % Apply 1 application topically 2 (two) times daily.  Marland Kitchen neomycin-bacitracin-polymyxin (NEOSPORIN) [phone] ointment Apply 1 application topically as needed (ulcer on the legs).  Marland Kitchen NIFEdipine (PROCARDIA-XL/ADALAT-CC/NIFEDICAL-XL) 30 MG 24 hr tablet Take 1 tablet (30 mg total) by mouth daily.  . nitroGLYCERIN (NITROSTAT) 0.4 MG SL tablet Place 1 tablet (0.4 mg total) under the tongue every 5 (five) minutes as needed for chest pain.  Marland Kitchen ondansetron (ZOFRAN ODT) 4 MG disintegrating tablet Take 1 tablet (4 mg total) by mouth every 8 (eight) hours as needed for nausea or vomiting.  . OXYGEN Inhale 3-5 L into the lungs daily.   . pantoprazole (PROTONIX) 40 MG tablet TAKE 1 TABLET DAILY (Patient taking differently: TAKE 40 MG TABLET DAILY)  . polyethylene glycol (MIRALAX / GLYCOLAX) packet Take 17 g by mouth 2 (two) times daily as needed for moderate constipation.  . potassium chloride SA (K-DUR,KLOR-CON) 20 MEQ tablet TAKE 1 TABLET DAILY  . PRODIGY TWIST TOP LANCETS 28G MISC CHECK BLOOD SUGAR UP TO 4 TIMES A DAY  . RESTASIS MULTIDOSE 0.05 % ophthalmic emulsion Place 1 drop into both eyes 2 (two) times daily.   . simethicone (MYLICON) 80 MG chewable tablet Chew 80 mg by mouth at bedtime as needed  for flatulence.  . sodium chloride (OCEAN) 0.65 % SOLN nasal spray Place 1 spray into both nostrils as needed for congestion.  . torsemide (DEMADEX) 20 MG tablet 2 tab po BID, one week take 3 tab in AM and 2 tab in PM (Patient taking differently: 2 tab po BID, (12/07/16)  one week take 3 tab in AM and 2 tab in PM)  . torsemide (DEMADEX) 20 MG tablet Take 1 tablet (20 mg total) by mouth daily.  . traMADol (ULTRAM) 50 MG tablet Take 1 tablet (50 mg total) by mouth every 6 (six) hours as needed for severe pain.  . vitamin C (ASCORBIC ACID) 500 MG tablet Take 500 mg by mouth at bedtime.   Marland Kitchen warfarin (COUMADIN) 4 MG tablet TAKE 1 TABLET MONDAY AND FRIDAY, AND 1 1/2 TABLETS ALL OTHER DAYS   No facility-administered encounter medications on file as of 12/13/2016.       Review of Systems  Constitutional: Negative.   HENT: Negative.  Eyes: Negative.   Respiratory: Cough: pt says its better.   Cardiovascular: Negative.   Gastrointestinal: Negative.   Endocrine: Negative.   Genitourinary: Negative.   Musculoskeletal: Negative.   Skin: Negative.   Allergic/Immunologic: Negative.   Neurological: Negative.   Hematological: Negative.   Psychiatric/Behavioral: Negative.        Objective:   Physical Exam  Constitutional: She is oriented to person, place, and time. She appears well-developed and well-nourished. She appears distressed.  The patient is alert but somewhat distressed.  HENT:  Head: Normocephalic and atraumatic.  Eyes: Conjunctivae and EOM are normal. Pupils are equal, round, and reactive to light. Right eye exhibits no discharge. Left eye exhibits no discharge. No scleral icterus.  Neck: Normal range of motion.  Cardiovascular: Normal rate, regular rhythm and normal heart sounds.   No murmur heard. The heart rate is regular today at 60/m  Pulmonary/Chest: Effort normal. No respiratory distress. She has no wheezes. She has no rales.  There are diminished breath sounds bilaterally.  There is little air movement.  Abdominal: Soft. Bowel sounds are normal. There is no tenderness.  Musculoskeletal: She exhibits edema.  The patient is in a wheelchair. There is edema of the right lower leg and foot and there is presacral edema. It is 1-2+ in nature.  Neurological: She is alert and oriented to person, place, and time.  Skin: Skin is warm and dry. There is erythema.  There is minimal erythema. There is a large right lateral leg blister and the skin was reapproximated slightly and a saline wet to dry dressing was applied with kling.  Psychiatric: She has a normal mood and affect. Her behavior is normal. Judgment and thought content normal.  This patient is clearly unable to be living by herself. She needs assistance with her daily activities of living as well as with her medication administration and with her general health care. We need to get nursing care into the home and the family needs to work on getting the patient into some type of intermediate care facility that can take better care of her and administer her medicines correctly.  Nursing note and vitals reviewed.  BP 123/64 (BP Location: Right Arm)   Pulse 76   Temp 97.9 F (36.6 C) (Oral)   Ht 5' (1.524 m)   Wt 122 lb (55.3 kg)   SpO2 (!) 73% Comment: on 5 liters  BMI 23.83 kg/m         Assessment & Plan:  1. PAF (paroxysmal atrial fibrillation) (HCC) -Continue to follow-up with cardiology - CoaguChek XS/INR Waived - BMP8+EGFR  2. Type 2 diabetes mellitus with stage 3 chronic kidney disease, with long-term current use of insulin (HCC) -Check blood sugars regularly  3. Hyperlipidemia LDL goal <70 -Continue current treatment  4. CKD stage 3 secondary to diabetes (HCC) -Avoid NSAIDs  5. Cellulitis of lower extremity, unspecified laterality -Monitor wound on leg closely with saline wet-to-dry compresses change dressing daily  6. Localized edema -Continue to take Bumex 20 mg 3 pills in the morning and 2  in the evening -BMP today -Return to clinic in 8 days for recheck  7. Atherosclerotic peripheral vascular disease ([place]) -Follow-up with cardiologist as planned  8.Congestive heart failure, chronic  Patient Instructions  Continue with Demadex 20 mg 3 pills in the morning and 2 pills in the evening We will ask home health to come by and supervised the dressing changes on her lower leg with saline wet-to-dry on a daily basis. We  will also ask him to review her medicines and make sure she is taking her Demadex correctly We will be getting a BMP today to follow-up on the extra fluid pill that she is supposed to be taking. She should continue her O2 continuously. We will not make any further changes to her fluid pill because we not sure she is taking it correctly. Her INR will be adjusted by the clinical pharmacists and is on the thick side today. She should follow-up with the cardiologist as planned  Arrie Senate MD

## 2016-12-13 NOTE — Patient Outreach (Addendum)
[facility]) Care Management   12/13/2016  [name] [birth year] [id number]  [name] is an 83 y.o. female  Subjective: Initial home visit with pt, HIPAA verified, pt reports she is to see primary MD today "because my leg is broken down again"  Pt reports she does not wear compression hose daily. Reports CBG ranges 110-120 with occasional elevated reading "for eating things I shouldn't like icing"  Pt states " I guess we're looking for some type of assisted living because I can't stay here without 24 hour care"  Pt states she is weighing daily, has assistance from friends at her apartment complex, states she is upset today "and I'm this way a lot of the time now"  Objective:   Vitals:   12/13/16 1202  BP: 128/64  Pulse: 70  Resp: 18  SpO2: 97%  Weight: 115 lb (52.2 kg)   ROS  Physical Exam  Constitutional: She is oriented to person, place, and time. She appears well-developed.  HENT:  Head: Normocephalic.  Neck: Normal range of motion. Neck supple.  Cardiovascular: Normal rate.   Respiratory:  bil breath sounds diminished all lobes  GI: Soft. Bowel sounds are normal.  Musculoskeletal: She exhibits edema.  1-2+ edema lower extremities bil  Neurological: She is alert and oriented to person, place, and time.  Skin: Skin is warm and dry.  Skin tear noted to right lower extremity anteriorally, scant clear drainage noted.  Psychiatric:  Pt agitated, upset today    Encounter Medications:   Outpatient Encounter Prescriptions as of 12/13/2016  Medication Sig  . ACCU-CHEK AVIVA PLUS test strip CHECK BLOOD SUGER UP TO 3 TIMES A DAY  . acetaminophen (TYLENOL) 500 MG tablet Take 500 mg by mouth every 4 (four) hours as needed for mild pain.   Marland Kitchen amiodarone (PACERONE) 200 MG tablet Take 0.5 tablets (100 mg total) by mouth daily.  Marland Kitchen aspirin EC 81 MG tablet Take 81 mg by mouth daily with breakfast.   . atorvastatin (LIPITOR) 20 MG tablet TAKE 1 TABLET DAILY IN THE  EVENING  . Azelastine HCl 0.15 % SOLN Place 1 spray into both nostrils 2 (two) times daily as needed (congestion).  . baclofen (LIORESAL) 10 MG tablet Take 10 mg by mouth 3 (three) times daily as needed for muscle spasms.  . budesonide-formoterol (SYMBICORT) 160-4.5 MCG/ACT inhaler Inhale 1 puff into the lungs 2 (two) times daily. (Patient taking differently: Inhale 1 puff into the lungs 2 (two) times daily as needed (wheezing). )  . Cholecalciferol (VITAMIN D3) 2000 units TABS Take 2,000 Units by mouth daily.  Marland Kitchen docusate sodium (COLACE) 100 MG capsule Take 100 mg by mouth daily as needed for moderate constipation.   . ferrous sulfate 325 (65 FE) MG tablet TAKE 1 TABLET ONCE DAILY WITH BREAKFAST  . fluticasone (FLONASE) 50 MCG/ACT nasal spray Place 1 spray into both nostrils 2 (two) times daily. (Patient taking differently: Place 1 spray into both nostrils as needed. )  . GuaiFENesin (CHEST CONGESTION RELIEF PO) Take 1-1.5 tablets by mouth as needed.   . hydrocortisone (ANUSOL-HC) 25 MG suppository Place 1 suppository (25 mg total) rectally 2 (two) times daily as needed for hemorrhoids or itching.  . Insulin Detemir (LEVEMIR FLEXTOUCH) 100 UNIT/ML Pen INJECT 8 units once a day each morning  . Insulin Pen Needle (PEN NEEDLES) 31G X 6 MM MISC USE AS DIRECTED UP TO 4 TIMES A DAY  . Ipratropium-Albuterol (COMBIVENT) 20-100 MCG/ACT AERS respimat Inhale 1 puff into  the lungs every 6 (six) hours as needed for wheezing or shortness of breath.  . Multiple Vitamins-Minerals (CENTRUM ADULTS) TABS Take 1 tablet by mouth daily with breakfast.  . mupirocin ointment (BACTROBAN) 2 % Apply 1 application topically 2 (two) times daily.  Marland Kitchen neomycin-bacitracin-polymyxin (NEOSPORIN) [phone] ointment Apply 1 application topically as needed (ulcer on the legs).  Marland Kitchen NIFEdipine (PROCARDIA-XL/ADALAT-CC/NIFEDICAL-XL) 30 MG 24 hr tablet Take 1 tablet (30 mg total) by mouth daily.  . nitroGLYCERIN (NITROSTAT) 0.4 MG SL tablet  Place 1 tablet (0.4 mg total) under the tongue every 5 (five) minutes as needed for chest pain.  Marland Kitchen ondansetron (ZOFRAN ODT) 4 MG disintegrating tablet Take 1 tablet (4 mg total) by mouth every 8 (eight) hours as needed for nausea or vomiting.  . OXYGEN Inhale 3-5 L into the lungs daily.   . pantoprazole (PROTONIX) 40 MG tablet TAKE 1 TABLET DAILY (Patient taking differently: TAKE 40 MG TABLET DAILY)  . polyethylene glycol (MIRALAX / GLYCOLAX) packet Take 17 g by mouth 2 (two) times daily as needed for moderate constipation.  . potassium chloride SA (K-DUR,KLOR-CON) 20 MEQ tablet TAKE 1 TABLET DAILY  . PRODIGY TWIST TOP LANCETS 28G MISC CHECK BLOOD SUGAR UP TO 4 TIMES A DAY  . RESTASIS MULTIDOSE 0.05 % ophthalmic emulsion Place 1 drop into both eyes 2 (two) times daily.   . simethicone (MYLICON) 80 MG chewable tablet Chew 80 mg by mouth at bedtime as needed for flatulence.  . sodium chloride (OCEAN) 0.65 % SOLN nasal spray Place 1 spray into both nostrils as needed for congestion.  . torsemide (DEMADEX) 20 MG tablet 2 tab po BID, one week take 3 tab in AM and 2 tab in PM (Patient taking differently: 2 tab po BID, (12/07/16)  one week take 3 tab in AM and 2 tab in PM)  . traMADol (ULTRAM) 50 MG tablet Take 1 tablet (50 mg total) by mouth every 6 (six) hours as needed for severe pain.  . vitamin C (ASCORBIC ACID) 500 MG tablet Take 500 mg by mouth at bedtime.   Marland Kitchen warfarin (COUMADIN) 4 MG tablet TAKE 1 TABLET MONDAY AND FRIDAY, AND 1 1/2 TABLETS ALL OTHER DAYS  . torsemide (DEMADEX) 20 MG tablet Take 1 tablet (20 mg total) by mouth daily. (Patient not taking: Reported on 12/13/2016)   No facility-administered encounter medications on file as of 12/13/2016.     Functional Status:   In your present state of health, do you have any difficulty performing the following activities: 12/13/2016 11/29/2016  Hearing? [name]  Vision? Y Y  Difficulty concentrating or making decisions? [name]  Walking or climbing stairs?  Y Y  Dressing or bathing? Y Y  Doing errands, shopping? [name]  Preparing Food and eating ? N -  Using the Toilet? N -  In the past six months, have you accidently leaked urine? N -  Do you have problems with loss of bowel control? N -  Managing your Medications? Y -  Managing your Finances? N -  Housekeeping or managing your Housekeeping? Y -  Some recent data might be hidden    Fall/Depression Screening:    Fall Risk  12/13/2016 12/07/2016 11/10/2016  Falls in the past year? - Yes Yes  Number falls in past yr: 1 1 1   Injury with Fall? No No No  Risk Factor Category  - - -  Risk for fall due to : History of fall(s);Medication side effect - -  Follow  up Falls evaluation completed;Education provided - -   PHQ 2/9 Scores 12/13/2016 12/07/2016 11/10/2016 10/25/2016 10/18/2016 10/12/2016 09/08/2016  PHQ - 2 Score 0 0 0 0 0 0 0  PHQ- 9 Score - - - - - - -    Assessment:  Pt irritated and tearful and feels she is losing her independence, will be seeing primary care today for open skin lesion to right lower extremity related to edema.  RN CM attempted to reconcile medication profile but unable to observe medication due to pt being upset and stating she knows what she is doing although caregiver concerned pt is not taking medications as prescribed although they are prepackaged by pharmacy. RN CM offered [place] services and pt refused citing she feels people are trying to change things and take things away from her.  Pt got upset because RN CM called [facility] to discuss issue with insulin.  Pt has novolog and humalog and states she does a sliding scale, also has levemir.  Per Marcie Bal at [facility] they have prescription for levemir but no prescription for novolog or humalog.  RN CM called patient's daughter in law Hoyle Barr (after today's home visit) and Coralyn Mark reports pt is not taking medications correctly, sometimes skips doses, Coralyn Mark reports home health with [facility] is to begin this week  and their social worker will be assisting pt with level of care issues, Coralyn Mark states FL2 is in process of being completed now and they are working on choosing a facility.  RN CM ask about referral for [facility] CSW and Coralyn Mark feels this will be too many people involved since Education officer, museum from home health will be assisting, RN CM let Coralyn Mark know pt refused [facility] pharmacy assistance.  RN CM ask Coralyn Mark to call for any questions or if she changes her mind about social work.  RN CM faxed barrier letter and initial home visit to primary MD Dr. Morrie [name], reported skin breakdown to right lower extremity, ask for clarification of insulin type and dosage.  THN CM Care Plan Problem One     Most Recent Value  Care Plan Problem One  Pt high risk for readmission related to CHF  Role Documenting the Problem One  Care Management Coordinator  Care Plan for Problem One  Active  THN Long Term Goal   Pt will demonstrate adequate self care for CHF within 90 days  THN Long Term Goal Start Date  12/02/16  Interventions for Problem One Long Term Goal  RN CM reinforced CHF zones and action plan, reviewed phone numbers to call  [facility] CM Short Term Goal #1   Pt will verbalize CHF zones within 30 days  THN CM Short Term Goal #1 Start Date  12/13/16  Interventions for Short Term Goal #1  RN CM reinforced and reviewed with pt importance of daily weights, CHF action plan/ zones, importance of calling MD early for change in health status, symptoms    THN CM Care Plan Problem Two     Most Recent Value  Care Plan Problem Two  Knowledge deficit related to medications  Role Documenting the Problem Two  Care Management Coordinator  Care Plan for Problem Two  Active  THN CM Short Term Goal #1   Pt will take medications as prescribed within 30 days  THN CM Short Term Goal #1 Start Date  12/13/16  Interventions for Short Term Goal #2   RN CM observed insulin from refrigerator and novolog not listed on med profile ,  RN CM called [facility],  spoke with Marcie Bal and she states they only have prescription for levemir, not novolog although pt states she is taking this, pt got upset and did not allow RN CM to observe prepackaged meds from [facility], caregiver is concerned that pt is "skipping pills", RN CM faxed barrier letter to primary MD Dr. Laurance Flatten and ask to verify insulin type and dosage      Plan: continue weekly transition of care calls See pt for home visit in July  Jacqlyn Larsen [facility], [place] Coordinator [phone]

## 2016-12-13 NOTE — Patient Instructions (Signed)
Continue with Demadex 20 mg 3 pills in the morning and 2 pills in the evening We will ask home health to come by and supervised the dressing changes on her lower leg with saline wet-to-dry on a daily basis. We will also ask him to review her medicines and make sure she is taking her Demadex correctly We will be getting a BMP today to follow-up on the extra fluid pill that she is supposed to be taking. She should continue her O2 continuously. We will not make any further changes to her fluid pill because we not sure she is taking it correctly. Her INR will be adjusted by the clinical pharmacists and is on the thick side today. She should follow-up with the cardiologist as planned

## 2016-12-14 DIAGNOSIS — I2510 Atherosclerotic heart disease of native coronary artery without angina pectoris: Secondary | ICD-10-CM | POA: Diagnosis not present

## 2016-12-14 DIAGNOSIS — J439 Emphysema, unspecified: Secondary | ICD-10-CM | POA: Diagnosis not present

## 2016-12-14 DIAGNOSIS — I110 Hypertensive heart disease with heart failure: Secondary | ICD-10-CM | POA: Diagnosis not present

## 2016-12-14 DIAGNOSIS — E785 Hyperlipidemia, unspecified: Secondary | ICD-10-CM | POA: Diagnosis not present

## 2016-12-14 DIAGNOSIS — N183 Chronic kidney disease, stage 3 (moderate): Secondary | ICD-10-CM | POA: Diagnosis not present

## 2016-12-14 DIAGNOSIS — Z955 Presence of coronary angioplasty implant and graft: Secondary | ICD-10-CM | POA: Diagnosis not present

## 2016-12-14 DIAGNOSIS — E1122 Type 2 diabetes mellitus with diabetic chronic kidney disease: Secondary | ICD-10-CM | POA: Diagnosis not present

## 2016-12-14 DIAGNOSIS — I5033 Acute on chronic diastolic (congestive) heart failure: Secondary | ICD-10-CM | POA: Diagnosis not present

## 2016-12-14 DIAGNOSIS — I70203 Unspecified atherosclerosis of native arteries of extremities, bilateral legs: Secondary | ICD-10-CM | POA: Diagnosis not present

## 2016-12-14 DIAGNOSIS — J9621 Acute and chronic respiratory failure with hypoxia: Secondary | ICD-10-CM | POA: Diagnosis not present

## 2016-12-14 DIAGNOSIS — M150 Primary generalized (osteo)arthritis: Secondary | ICD-10-CM | POA: Diagnosis not present

## 2016-12-14 DIAGNOSIS — R410 Disorientation, unspecified: Secondary | ICD-10-CM | POA: Diagnosis not present

## 2016-12-14 DIAGNOSIS — I480 Paroxysmal atrial fibrillation: Secondary | ICD-10-CM | POA: Diagnosis not present

## 2016-12-14 LAB — BMP8+EGFR
BUN/Creatinine Ratio: 25 (ref 12–28)
BUN: 32 mg/dL — AB (ref 8–27)
CALCIUM: 9.2 mg/dL (ref 8.7–10.3)
CO2: 39 mmol/L — AB (ref 20–29)
CREATININE: 1.29 mg/dL — AB (ref 0.57–1.00)
Chloride: 94 mmol/L — ABNORMAL LOW (ref 96–106)
GFR calc Af Amer: 43 mL/min/{1.73_m2} — ABNORMAL LOW (ref >59)
GFR calc non Af Amer: 38 mL/min/{1.73_m2} — ABNORMAL LOW (ref >59)
GLUCOSE: 204 mg/dL — AB (ref 65–99)
Potassium: 4 mmol/L (ref 3.5–5.2)
SODIUM: 145 mmol/L — AB (ref 134–144)

## 2016-12-14 NOTE — Progress Notes (Signed)
Tammy, would you review this and call Almyra Free for me.

## 2016-12-14 NOTE — Progress Notes (Signed)
We LM at number today 6/20-jhb for [name] to call

## 2016-12-14 NOTE — Progress Notes (Signed)
York Cerise, would you are Tammy please confirm with Almyra Free Mrs. Sokolowski's current insulin and how much.

## 2016-12-15 ENCOUNTER — Other Ambulatory Visit: Payer: Self-pay | Admitting: *Deleted

## 2016-12-15 ENCOUNTER — Telehealth: Payer: Self-pay | Admitting: *Deleted

## 2016-12-15 NOTE — Patient Outreach (Signed)
Telephone call to patient's daughter in law [name] and reported that primary MD office consulted with their pharmacist about [name]'s insulin and reported humalog was discontinued back in February and record of novolog is from 2015 from patient assistance program so per their records pt should only be taking levemir.  MD office ask that pt bring in all insulins at next appointment which is next Tuesday and MD office feels pt will most likely get upset if told to stop novolog and humalog at this time and would be most appropriate to see pt and then determine appropriate regimen.  [name] states she received this same phone call and information from MD office and verbalizes understanding.  [name] states pt CBG readings vary from 100's range up to 300 at times depending on what pt eats.  [name] [facility], [place] Coordinator [phone]

## 2016-12-15 NOTE — Telephone Encounter (Signed)
[name] was given lab results and will bring all of patient's medications for review.  She is taking several insulin medications.

## 2016-12-16 DIAGNOSIS — I2510 Atherosclerotic heart disease of native coronary artery without angina pectoris: Secondary | ICD-10-CM | POA: Diagnosis not present

## 2016-12-16 DIAGNOSIS — I5033 Acute on chronic diastolic (congestive) heart failure: Secondary | ICD-10-CM | POA: Diagnosis not present

## 2016-12-16 DIAGNOSIS — N183 Chronic kidney disease, stage 3 (moderate): Secondary | ICD-10-CM | POA: Diagnosis not present

## 2016-12-16 DIAGNOSIS — M150 Primary generalized (osteo)arthritis: Secondary | ICD-10-CM | POA: Diagnosis not present

## 2016-12-16 DIAGNOSIS — E785 Hyperlipidemia, unspecified: Secondary | ICD-10-CM | POA: Diagnosis not present

## 2016-12-16 DIAGNOSIS — J439 Emphysema, unspecified: Secondary | ICD-10-CM | POA: Diagnosis not present

## 2016-12-16 DIAGNOSIS — J9621 Acute and chronic respiratory failure with hypoxia: Secondary | ICD-10-CM | POA: Diagnosis not present

## 2016-12-16 DIAGNOSIS — I110 Hypertensive heart disease with heart failure: Secondary | ICD-10-CM | POA: Diagnosis not present

## 2016-12-16 DIAGNOSIS — Z955 Presence of coronary angioplasty implant and graft: Secondary | ICD-10-CM | POA: Diagnosis not present

## 2016-12-16 DIAGNOSIS — E1122 Type 2 diabetes mellitus with diabetic chronic kidney disease: Secondary | ICD-10-CM | POA: Diagnosis not present

## 2016-12-16 DIAGNOSIS — R410 Disorientation, unspecified: Secondary | ICD-10-CM | POA: Diagnosis not present

## 2016-12-16 DIAGNOSIS — I480 Paroxysmal atrial fibrillation: Secondary | ICD-10-CM | POA: Diagnosis not present

## 2016-12-16 DIAGNOSIS — I70203 Unspecified atherosclerosis of native arteries of extremities, bilateral legs: Secondary | ICD-10-CM | POA: Diagnosis not present

## 2016-12-19 ENCOUNTER — Other Ambulatory Visit: Payer: Self-pay | Admitting: *Deleted

## 2016-12-19 DIAGNOSIS — L97519 Non-pressure chronic ulcer of other part of right foot with unspecified severity: Secondary | ICD-10-CM | POA: Diagnosis not present

## 2016-12-19 DIAGNOSIS — R451 Restlessness and agitation: Secondary | ICD-10-CM | POA: Diagnosis not present

## 2016-12-19 DIAGNOSIS — J449 Chronic obstructive pulmonary disease, unspecified: Secondary | ICD-10-CM | POA: Diagnosis not present

## 2016-12-19 DIAGNOSIS — L8992 Pressure ulcer of unspecified site, stage 2: Secondary | ICD-10-CM | POA: Diagnosis not present

## 2016-12-19 DIAGNOSIS — M6281 Muscle weakness (generalized): Secondary | ICD-10-CM | POA: Diagnosis not present

## 2016-12-19 DIAGNOSIS — N189 Chronic kidney disease, unspecified: Secondary | ICD-10-CM | POA: Diagnosis not present

## 2016-12-19 DIAGNOSIS — E784 Other hyperlipidemia: Secondary | ICD-10-CM | POA: Diagnosis not present

## 2016-12-19 DIAGNOSIS — K219 Gastro-esophageal reflux disease without esophagitis: Secondary | ICD-10-CM | POA: Diagnosis not present

## 2016-12-19 DIAGNOSIS — I10 Essential (primary) hypertension: Secondary | ICD-10-CM | POA: Diagnosis not present

## 2016-12-19 DIAGNOSIS — I482 Chronic atrial fibrillation: Secondary | ICD-10-CM | POA: Diagnosis not present

## 2016-12-19 DIAGNOSIS — L0390 Cellulitis, unspecified: Secondary | ICD-10-CM | POA: Diagnosis not present

## 2016-12-19 DIAGNOSIS — I509 Heart failure, unspecified: Secondary | ICD-10-CM | POA: Diagnosis not present

## 2016-12-19 DIAGNOSIS — E119 Type 2 diabetes mellitus without complications: Secondary | ICD-10-CM | POA: Diagnosis not present

## 2016-12-19 DIAGNOSIS — I739 Peripheral vascular disease, unspecified: Secondary | ICD-10-CM | POA: Diagnosis not present

## 2016-12-19 NOTE — Patient Outreach (Signed)
RN CM called pt for transition of care week 4, spoke with patient's daughter Melina Schools, HIPAA verified, Santiago Glad reports "my mother is going to a facility today in [place] for long term but I can't think of the name of facility at present".  No other concerns voiced. RN CM mailed case closure letter to patient (mail being forwarded) ,  Faxed letter to primary care MD Dr. Morrie [name] informing of case closure.  Jacqlyn Larsen [facility], [place] Coordinator [phone]

## 2016-12-21 ENCOUNTER — Ambulatory Visit: Payer: Medicare Other | Admitting: Family Medicine

## 2016-12-26 DIAGNOSIS — R451 Restlessness and agitation: Secondary | ICD-10-CM | POA: Diagnosis not present

## 2016-12-29 DIAGNOSIS — L97519 Non-pressure chronic ulcer of other part of right foot with unspecified severity: Secondary | ICD-10-CM | POA: Diagnosis not present

## 2017-01-02 DIAGNOSIS — I739 Peripheral vascular disease, unspecified: Secondary | ICD-10-CM | POA: Diagnosis not present

## 2017-01-02 DIAGNOSIS — I509 Heart failure, unspecified: Secondary | ICD-10-CM | POA: Diagnosis not present

## 2017-01-02 DIAGNOSIS — E119 Type 2 diabetes mellitus without complications: Secondary | ICD-10-CM | POA: Diagnosis not present

## 2017-01-03 NOTE — Progress Notes (Deleted)
Cardiology Office Note   Date:  01/03/2017   ID:  [name], DOB 08-12-1927, MRN [id number]  PCP:  Melinda Herb, MD  Cardiologist:   Melinda Breeding, MD    No chief complaint on file.     History of Present Illness: [name] is a 83 y.o. 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  She had an echo that suggested that the EF was lower than previous.  However, I felt that it was about the same.  She did have a very mild troponin elevation with a flat trend.  She had significant issues with agitation during that admission.  We managed her conservatively with IV diuresis.    At the last office visit she had had increased volume requiring increased diuresis.  ***  She presents today and says that her weights at home have been stable since discharge.  Her scale reads 10 lbs lighter than ours.  She saw Dr. Laurance Flatten just before me and he talked to her about entering a nursing home.  She has conflicts with her family.  She uses some salt.  It doesn't sound like she wants to use her O2 as she should.  "I don't want to become dependent."  She has some chills and hot flashes.  She denies pain.  She says that her breathing has been OK.  She does have some increased leg swelling compared to when I saw her in the hospital.       Past Medical History:  Diagnosis Date  . Anxiety   . Bradycardia    Torpol stopped  . CAD (coronary artery disease)    Cath in 2006 showed an occluded RCA with left-to-right collaterals & otherwise noncritical CAD with EF=25-30% at that time. EF subsequently improved to normal by 2D ECHO. Cath Aug. 2011 showed unchanged anatomy. > medical therapy recommended  . Carotid artery disease (HCC)    Moderate, right greater than left which we following by duplex ultrasound. [place] 12/05/11 = RIght Bulb/Prox ICA: Moderate to severe amt of fibrous plaque elevating velocities w/in prox segment of ICA. Consistent  w/a 50-69% diameter reduction. Left Bulb/Prox ICA: Moderate amt of fibrous plaque slightly elevating velocities w/in the prox segment of the ICA. Consistent w/a 0-49% diameter reduction.   . Chronic lower back pain   . COPD (chronic obstructive pulmonary disease) (HCC)    GOLD 4 - PFT 06/08/10 FEV1 0.93 (62%), FEV1% 60, TLC 2.84 (69%0, DLCO 54%, +BD) On home O2.  . Diastolic dysfunction    Grade II  . GERD (gastroesophageal reflux disease)   . Hyperlipidemia   . Hypertension   . Hypoxemic respiratory failure, chronic (HCC)    uses 2.5 liters of oxygen with sleep  . Lower extremity edema    ECHO 07/21/12 = EF 55-60%, performed for TIA. Responding to diurectics. Continue diuresis w/ a goal dry weight of <160lb. Venous Duplex 07/27/11 = Right lower extremity: no evidence of thrombus or thrombophlebitis. Essentially normal right lower extremity venous duplex Doppler evaluation.  . Myocardial infarction [facility])    "she's had several" (01/13/2015)  . On home oxygen therapy    "?L; prn" (01/13/2015)  . PAF (paroxysmal atrial fibrillation) (HCC)    In the past, on coumadin  . RBBB (right bundle branch block)    Chronic  . Secondary pulmonary hypertension   . Sleep apnea   . Stroke [facility])    "/CT scan; ~  2014; didn't even know she'd had it"  (01/13/2015)  . Type II diabetes mellitus (HCC)    goal A1C is 8, to avoid hypoglycemia    Past Surgical History:  Procedure Laterality Date  . CARDIAC CATHETERIZATION  2006 & 2011   Cath in 2006 showed an occluded RCA with left-to-right collaterals & otherwise noncritical CAD with EF=25-30% at that time. EF subsequently improved to normal by 2D ECHO. Cath Aug. 2011 showed unchanged anatomy. > medical therapy recommended.  . Carotid Duplex  12/05/11   Moderate, right greater than left which we following by duplex ultrasound. [place] 12/05/11 = RIght Bulb/Prox ICA: Moderate to severe amt of fibrous plaque elevating velocities w/in prox segment of ICA. Consistent w/a  50-69% diameter reduction. Left Bulb/Prox ICA: Moderate amt of fibrous plaque slightly elevating velocities w/in the prox segment of the ICA. Consistent w/a 0-49% diameter reduction.   Marland Kitchen CATARACT EXTRACTION W/ INTRAOCULAR LENS  IMPLANT, BILATERAL Bilateral   . CORONARY ANGIOPLASTY    . CORONARY ANGIOPLASTY WITH STENT PLACEMENT     x2  . FEMUR IM NAIL Left 01/15/2015   Procedure: INTRAMEDULLARY (IM) NAIL FEMORAL LEFT ;  Surgeon: Netta Cedars, MD;  Location: [place];  Service: Orthopedics;  Laterality: Left;  . SHOULDER OPEN ROTATOR CUFF REPAIR Right 07/2005  . TONSILLECTOMY    . TOTAL ABDOMINAL HYSTERECTOMY  ~ 1967  . TRANSVAGINAL TAPE (TVT) REMOVAL       Current Outpatient Prescriptions  Medication Sig Dispense Refill  . ACCU-CHEK AVIVA PLUS test strip CHECK BLOOD SUGER UP TO 3 TIMES A DAY 100 each 2  . acetaminophen (TYLENOL) 500 MG tablet Take 500 mg by mouth every 4 (four) hours as needed for mild pain.     Marland Kitchen amiodarone (PACERONE) 200 MG tablet Take 0.5 tablets (100 mg total) by mouth daily. 15 tablet 6  . aspirin EC 81 MG tablet Take 81 mg by mouth daily with breakfast.     . atorvastatin (LIPITOR) 20 MG tablet TAKE 1 TABLET DAILY IN THE EVENING 90 tablet 0  . Azelastine HCl 0.15 % SOLN Place 1 spray into both nostrils 2 (two) times daily as needed (congestion).    . baclofen (LIORESAL) 10 MG tablet Take 10 mg by mouth 3 (three) times daily as needed for muscle spasms.    . budesonide-formoterol (SYMBICORT) 160-4.5 MCG/ACT inhaler Inhale 1 puff into the lungs 2 (two) times daily. (Patient taking differently: Inhale 1 puff into the lungs 2 (two) times daily as needed (wheezing). ) 3 Inhaler 2  . Cholecalciferol (VITAMIN D3) 2000 units TABS Take 2,000 Units by mouth daily.    Marland Kitchen docusate sodium (COLACE) 100 MG capsule Take 100 mg by mouth daily as needed for moderate constipation.     . ferrous sulfate 325 (65 FE) MG tablet TAKE 1 TABLET ONCE DAILY WITH BREAKFAST 100 tablet 4  . fluticasone  (FLONASE) 50 MCG/ACT nasal spray Place 1 spray into both nostrils 2 (two) times daily. (Patient taking differently: Place 1 spray into both nostrils as needed. ) 16 g 6  . GuaiFENesin (CHEST CONGESTION RELIEF PO) Take 1-1.5 tablets by mouth as needed.     . hydrocortisone (ANUSOL-HC) 25 MG suppository Place 1 suppository (25 mg total) rectally 2 (two) times daily as needed for hemorrhoids or itching. 12 suppository 0  . Insulin Detemir (LEVEMIR FLEXTOUCH) 100 UNIT/ML Pen INJECT 8 units once a day each morning 15 mL 6  . Insulin Pen Needle (PEN NEEDLES) 31G X 6 MM  MISC USE AS DIRECTED UP TO 4 TIMES A DAY 200 each 1  . Ipratropium-Albuterol (COMBIVENT) 20-100 MCG/ACT AERS respimat Inhale 1 puff into the lungs every 6 (six) hours as needed for wheezing or shortness of breath. 3 Inhaler 2  . Multiple Vitamins-Minerals (CENTRUM ADULTS) TABS Take 1 tablet by mouth daily with breakfast.    . mupirocin ointment (BACTROBAN) 2 % Apply 1 application topically 2 (two) times daily. 22 g 0  . neomycin-bacitracin-polymyxin (NEOSPORIN) [phone] ointment Apply 1 application topically as needed (ulcer on the legs).    Marland Kitchen NIFEdipine (PROCARDIA-XL/ADALAT-CC/NIFEDICAL-XL) 30 MG 24 hr tablet Take 1 tablet (30 mg total) by mouth daily. 30 tablet 6  . nitroGLYCERIN (NITROSTAT) 0.4 MG SL tablet Place 1 tablet (0.4 mg total) under the tongue every 5 (five) minutes as needed for chest pain. 25 tablet 0  . ondansetron (ZOFRAN ODT) 4 MG disintegrating tablet Take 1 tablet (4 mg total) by mouth every 8 (eight) hours as needed for nausea or vomiting. 12 tablet 3  . OXYGEN Inhale 3-5 L into the lungs daily.     . pantoprazole (PROTONIX) 40 MG tablet TAKE 1 TABLET DAILY (Patient taking differently: TAKE 40 MG TABLET DAILY) 60 tablet 1  . polyethylene glycol (MIRALAX / GLYCOLAX) packet Take 17 g by mouth 2 (two) times daily as needed for moderate constipation. 14 each 0  . potassium chloride SA (K-DUR,KLOR-CON) 20 MEQ tablet TAKE 1  TABLET DAILY 30 tablet 5  . PRODIGY TWIST TOP LANCETS 28G MISC CHECK BLOOD SUGAR UP TO 4 TIMES A DAY 200 each 2  . RESTASIS MULTIDOSE 0.05 % ophthalmic emulsion Place 1 drop into both eyes 2 (two) times daily.     . simethicone (MYLICON) 80 MG chewable tablet Chew 80 mg by mouth at bedtime as needed for flatulence.    . sodium chloride (OCEAN) 0.65 % SOLN nasal spray Place 1 spray into both nostrils as needed for congestion. 60 mL 0  . torsemide (DEMADEX) 20 MG tablet 2 tab po BID, one week take 3 tab in AM and 2 tab in PM (Patient taking differently: 2 tab po BID, (12/07/16)  one week take 3 tab in AM and 2 tab in PM) 150 tablet 1  . torsemide (DEMADEX) 20 MG tablet Take 1 tablet (20 mg total) by mouth daily. 20 tablet 0  . traMADol (ULTRAM) 50 MG tablet Take 1 tablet (50 mg total) by mouth every 6 (six) hours as needed for severe pain. 15 tablet 0  . vitamin C (ASCORBIC ACID) 500 MG tablet Take 500 mg by mouth at bedtime.     Marland Kitchen warfarin (COUMADIN) 4 MG tablet TAKE 1 TABLET MONDAY AND FRIDAY, AND 1 1/2 TABLETS ALL OTHER DAYS 45 tablet 0   No current facility-administered medications for this visit.     Allergies:   Atorvastatin; Codeine; Morphine; Other; Rosuvastatin; Sulfa antibiotics; and Iohexol    ROS:  Please see the history of present illness.   Otherwise, review of systems are positive for ***.   All other systems are reviewed and negative.    PHYSICAL EXAM: VS:  There were no vitals taken for this visit. , BMI There is no height or weight on file to calculate BMI.  GENERAL:  Well appearing NECK:  No jugular venous distention, waveform within normal limits, carotid upstroke brisk and symmetric, no bruits, no thyromegaly LUNGS:  Clear to auscultation bilaterally CHEST:  Unremarkable HEART:  PMI not displaced or sustained,S1 and S2 within normal  limits, no S3, no S4, no clicks, no rubs, *** murmurs ABD:  Flat, positive bowel sounds normal in frequency in pitch, no bruits, no rebound,  no guarding, no midline pulsatile mass, no hepatomegaly, no splenomegaly EXT:  2 plus pulses throughout, no edema, no cyanosis no clubbing   GENERAL:  Frail appearing NECK:  No jugular venous distention at 90 degrees, waveform within normal limits, carotid upstroke brisk and symmetric, no bruits, no thyromegaly LUNGS:  Clear to auscultation bilaterally BACK:  No CVA tenderness CHEST:  Unremarkable HEART:  PMI not displaced or sustained,S1 and S2 within normal limits, no S3, no S4, no clicks, no rubs, no murmurs ABD:  Flat, positive bowel sounds normal in frequency in pitch, no bruits, no rebound, no guarding, no midline pulsatile mass, no hepatomegaly, no splenomegaly EXT:  2 plus pulses throughout, mild bilateral leg edema, no cyanosis no clubbing   EKG:  EKG is not ***ordered today. ***  Recent Labs: 12/01/2016: B Natriuretic Peptide 468.6 12/07/2016: ALT 29; Hemoglobin 10.1; Platelets 294 12/13/2016: BUN 32; Creatinine, Ser 1.29; Potassium 4.0; Sodium 145    Lipid Panel    Component Value Date/Time   CHOL 163 12/07/2016 1103   TRIG 86 12/07/2016 1103   HDL 77 12/07/2016 1103   CHOLHDL 2.1 12/07/2016 1103   CHOLHDL 3.0 07/23/2012 0730   VLDL 19 07/23/2012 0730   LDLCALC 69 12/07/2016 1103      Wt Readings from Last 3 Encounters:  12/13/16 122 lb (55.3 kg)  12/13/16 115 lb (52.2 kg)  12/07/16 122 lb (55.3 kg)      Other studies Reviewed: Additional studies/ records that were reviewed today include: Labs Review of the above records demonstrates:  Please see elsewhere in the note.     ASSESSMENT AND PLAN:  ACUTE ON CHRONIC DIASTOLIC HF:   *** Dr. Laurance Flatten addressed her volume status and she's going to take some increased diuretic for one week and then see him. I'm sure she'll get repeat blood work at that time. She and I discussed using no salt and keeping her feet elevated. She should be wearing her oxygen all the time as her oxygen saturations are low.  CAD:   Troponin  was mildly elevated previous..  However, I thougth that this was likely demand ischemia.  ***  She denies any chest pain today.  We will manage this conservatively.    CKD III:   Creat was improved 3 weeks ago at 1.29.  ***  BRADYCARDIA:    She has not tolerated beta blocker in the past. *** We will avoid this.    CAROTID STENOSIS:   She has had bilateral 50 - 69% stenosis a couple of years ago.  She is very frail and likely would not be a candidate for CEA or stenting. ***  I will discuss repeat testing in the future with the patient.   HTN:   BP is ***   at target.  It was low in the past so I will not titrate meds.   PAF:  ***  Ms. Eileen Kangas has a CHA2DS2 - VASc score of 5.     Current medicines are reviewed at length with the patient today.  The patient does not have concerns regarding medicines.  The following changes have been made:  ***  Labs/ tests ordered today include:  *** No orders of the defined types were placed in this encounter.    Disposition:   FU with me ***   Signed, Jeneen Rinks  Madisson Kulaga, MD  01/03/2017 9:20 AM    [place]

## 2017-01-04 ENCOUNTER — Ambulatory Visit: Payer: Medicare Other | Admitting: Cardiology

## 2017-01-04 DIAGNOSIS — Z5181 Encounter for therapeutic drug level monitoring: Secondary | ICD-10-CM | POA: Diagnosis not present

## 2017-01-04 DIAGNOSIS — Z7901 Long term (current) use of anticoagulants: Secondary | ICD-10-CM | POA: Diagnosis not present

## 2017-01-05 ENCOUNTER — Telehealth: Payer: Self-pay | Admitting: Family Medicine

## 2017-01-05 DIAGNOSIS — I469 Cardiac arrest, cause unspecified: Secondary | ICD-10-CM | POA: Diagnosis not present

## 2017-01-10 ENCOUNTER — Ambulatory Visit: Payer: Medicare Other | Admitting: *Deleted

## 2017-01-11 ENCOUNTER — Encounter: Payer: Self-pay | Admitting: Pharmacist

## 2017-01-25 DIAGNOSIS — 419620001 Death: Secondary | SNOMED CT | POA: Diagnosis not present

## 2017-01-25 NOTE — Telephone Encounter (Signed)
Tried to call - no answer - left message with condolences.

## 2017-01-25 DEATH — deceased
